# Patient Record
Sex: Male | Born: 1950 | Race: White | Hispanic: No | Marital: Single | Smoking: Never smoker
Health system: Southern US, Community
[De-identification: ages and names within clinical notes are randomized; demographics above are authoritative.]

## PROBLEM LIST (undated history)

## (undated) DIAGNOSIS — I5032 Chronic diastolic (congestive) heart failure: Secondary | ICD-10-CM

## (undated) DIAGNOSIS — I499 Cardiac arrhythmia, unspecified: Secondary | ICD-10-CM

## (undated) DIAGNOSIS — I4891 Unspecified atrial fibrillation: Secondary | ICD-10-CM

## (undated) DIAGNOSIS — I447 Left bundle-branch block, unspecified: Secondary | ICD-10-CM

## (undated) DIAGNOSIS — I6529 Occlusion and stenosis of unspecified carotid artery: Secondary | ICD-10-CM

## (undated) DIAGNOSIS — I2510 Atherosclerotic heart disease of native coronary artery without angina pectoris: Secondary | ICD-10-CM

## (undated) DIAGNOSIS — I10 Essential (primary) hypertension: Secondary | ICD-10-CM

## (undated) DIAGNOSIS — I509 Heart failure, unspecified: Secondary | ICD-10-CM

## (undated) DIAGNOSIS — E785 Hyperlipidemia, unspecified: Secondary | ICD-10-CM

## (undated) HISTORY — PX: NO PAST SURGERIES: SHX2092

## (undated) HISTORY — DX: Hyperlipidemia, unspecified: E78.5

## (undated) HISTORY — PX: CATARACT EXTRACTION, BILATERAL: SHX1313

## (undated) HISTORY — DX: Heart failure, unspecified: I50.9

## (undated) HISTORY — DX: Left bundle-branch block, unspecified: I44.7

## (undated) HISTORY — DX: Atherosclerotic heart disease of native coronary artery without angina pectoris: I25.10

## (undated) HISTORY — DX: Occlusion and stenosis of unspecified carotid artery: I65.29

---
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
## 2003-01-21 ENCOUNTER — Encounter: Admission: RE | Admit: 2003-01-21 | Discharge: 2003-01-21 | Payer: Self-pay | Admitting: Family Medicine

## 2003-01-21 ENCOUNTER — Encounter: Payer: Self-pay | Admitting: Family Medicine

## 2015-02-02 ENCOUNTER — Emergency Department (HOSPITAL_BASED_OUTPATIENT_CLINIC_OR_DEPARTMENT_OTHER): Payer: 59

## 2015-02-02 ENCOUNTER — Encounter (HOSPITAL_BASED_OUTPATIENT_CLINIC_OR_DEPARTMENT_OTHER): Payer: Self-pay | Admitting: *Deleted

## 2015-02-02 ENCOUNTER — Emergency Department (HOSPITAL_BASED_OUTPATIENT_CLINIC_OR_DEPARTMENT_OTHER)
Admission: EM | Admit: 2015-02-02 | Discharge: 2015-02-02 | Disposition: A | Discharge status: Home / Self Care | Payer: 59 | Attending: Emergency Medicine | Admitting: Emergency Medicine

## 2015-02-02 DIAGNOSIS — Y9301 Activity, walking, marching and hiking: Secondary | ICD-10-CM | POA: Diagnosis not present

## 2015-02-02 DIAGNOSIS — Y998 Other external cause status: Secondary | ICD-10-CM | POA: Insufficient documentation

## 2015-02-02 DIAGNOSIS — Y9289 Other specified places as the place of occurrence of the external cause: Secondary | ICD-10-CM | POA: Insufficient documentation

## 2015-02-02 DIAGNOSIS — S02412A LeFort II fracture, initial encounter for closed fracture: Secondary | ICD-10-CM | POA: Insufficient documentation

## 2015-02-02 DIAGNOSIS — S02411A LeFort I fracture, initial encounter for closed fracture: Secondary | ICD-10-CM

## 2015-02-02 DIAGNOSIS — S0031XA Abrasion of nose, initial encounter: Secondary | ICD-10-CM | POA: Diagnosis not present

## 2015-02-02 DIAGNOSIS — W010XXA Fall on same level from slipping, tripping and stumbling without subsequent striking against object, initial encounter: Secondary | ICD-10-CM | POA: Insufficient documentation

## 2015-02-02 DIAGNOSIS — Z23 Encounter for immunization: Secondary | ICD-10-CM | POA: Insufficient documentation

## 2015-02-02 DIAGNOSIS — S0992XA Unspecified injury of nose, initial encounter: Secondary | ICD-10-CM | POA: Diagnosis present

## 2015-02-02 MED ORDER — HYDROCODONE-ACETAMINOPHEN 5-325 MG PO TABS: ORAL_TABLET | ORAL | Status: DC

## 2015-02-02 MED ORDER — TETANUS-DIPHTH-ACELL PERTUSSIS 5-2.5-18.5 LF-MCG/0.5 IM SUSP
0.5000 mL | Freq: Once | INTRAMUSCULAR | Status: AC
  Administered 2015-02-02: 0.5 mL via INTRAMUSCULAR
  Filled 2015-02-02: qty 0.5

## 2015-02-02 MED ORDER — ACETAMINOPHEN 500 MG PO TABS
1000.0000 mg | ORAL_TABLET | Freq: Once | ORAL | Status: AC
  Administered 2015-02-02: 1000 mg via ORAL
  Filled 2015-02-02: qty 2

## 2015-02-02 NOTE — ED Notes (Signed)
Pt reports he tripped and fell on sidewalk while walking in [employer] caf[employer]ia- abrasions noted to face- denies LOC- ems eval on scene

## 2015-02-02 NOTE — Discharge Instructions (Signed)
Apply ice to the affected area to reduce swelling.  Eat only soft diet and make an appointment with the maxillofacial surgeon Dr. Marzetta Boardhimappa as soon as possible.  Do not hesitate to return to the emergency room for any new, worsening or concerning symptoms.  Please follow with your primary care doctor in the next 2 days for a check-up. They must obtain records for further management.   Do not hesitate to return to the Emergency Department for any new, worsening or concerning symptoms.

## 2015-02-02 NOTE — ED Provider Notes (Signed)
CSN: [id number]     Arrival date & time 02/02/15  1702 History   First MD Initiated Contact with Patient 02/02/15 1712     Chief Complaint  Patient presents with  . Fall     (Consider location/radiation/quality/duration/timing/severity/associated sxs/prior Treatment) HPI   [name] Rice is a 64 y.o. male complaining of nasal pain, swelling and nosebleed status post mechanical slip and fall. Patient was walking and fell forward onto the sidewalk, he does not think he put his hands out to brace him. There was no loss of consciousness, patient does not take any blood thinners. Denies cervicalgia, chest pain, headache, change in vision, dysarthria, ataxia, abdominal pain, nausea vomiting, difficulty moving major joints. States his pain is 6 out of 10, he does not remember when his last tetanus shot was given.  History reviewed. No pertinent past medical history. History reviewed. No pertinent past surgical history. No family history on file. History  Substance Use Topics  . Smoking status: Never Smoker   . Smokeless tobacco: Never Used  . Alcohol Use: 1.8 oz/week    3 Cans of beer per week    Review of Systems  10 systems reviewed and found to be negative, except as noted in the HPI.   Allergies  Review of patient's allergies indicates no known allergies.  Home Medications   Prior to Admission medications   Not on File   BP 175/98 mmHg  Pulse 91  Temp(Src) 97.8 F (36.6 C) (Oral)  Resp 18  Ht 6' (1.829 m)  Wt 290 lb (131.543 kg)  BMI 39.32 kg/m2  SpO2 100% Physical Exam  Constitutional: He is oriented to person, place, and time. He appears well-developed and well-nourished. No distress.  Patient is speaking with a very nasal voice, states he cannot breathe out of his nose.  HENT:  Head: Normocephalic.  Mouth/Throat: Oropharynx is clear and moist.  Significant swelling along the nasal bridge with associated scattered minor partial-thickness abrasions. Patient has no  tenderness palpation or crepitance along the orbital rim, extraocular movement is intact without pain or diplopia. No intraoral trauma.  Nasal septum appears to be been benign, no septal hematomas.  Eyes: Conjunctivae and EOM are normal. Pupils are equal, round, and reactive to light.  Neck: Normal range of motion. Neck supple.  No midline C-spine  tenderness to palpation or step-offs appreciated. Patient has full range of motion without pain.   Cardiovascular: Normal rate, regular rhythm and intact distal pulses.   Pulmonary/Chest: Effort normal. No stridor.  Musculoskeletal: Normal range of motion.  Neurological: He is alert and oriented to person, place, and time.  Follows commands, Clear, goal oriented speech, Strength is 5 out of 5x4 extremities, patient ambulates with a coordinated in nonantalgic gait. Sensation is grossly intact.   Psychiatric: He has a normal mood and affect.  Nursing note and vitals reviewed.   ED Course  Procedures (including critical care time) Labs Review Labs Reviewed - No data to display  Imaging Review No results found.   EKG Interpretation None      MDM   Final diagnoses:  None    Filed Vitals:   02/02/15 1711  BP: 175/98  Pulse: 91  Temp: 97.8 F (36.6 C)  TempSrc: Oral  Resp: 18  Height: 6' (1.829 m)  Weight: 290 lb (131.543 kg)  SpO2: 100%    Medications  acetaminophen (TYLENOL) tablet 1,000 mg (not administered)  Tdap (BOOSTRIX) injection 0.5 mL (not administered)    [name]  is a pleasant 64 y.o. male presenting with mechanical slip and fall forward with trauma to the nasal bridge and associated epistaxis. Patient was evaluated by EMS on scene but opted to drive himself to the ED. Neuro exam is nonfocal, patient is not taking any blood thinners. No signs of intraocular entrapment. Will CT maxillofacial and C-spine. Patient fails Nexus secondary to distracting injury.  CT shows an incomplete LeFort type I  fracture.  Case discussed with maxillofacial surgeon Dr. Marzetta Boardhimappa she recommends a soft diet and patient can follow-up in the office this week.  Evaluation does not show pathology that would require ongoing emergent intervention or inpatient treatment. Pt is hemodynamically stable and mentating appropriately. Discussed findings and plan with patient/guardian, who agrees with care plan. All questions answered. Return precautions discussed and outpatient follow up given.   Discharge Medication List as of 02/02/2015  7:41 PM    START taking these medications   Details  HYDROcodone-acetaminophen (NORCO/VICODIN) 5-325 MG per tablet Take 1-2 tablets by mouth every 6 hours as needed for pain and/or cough., Print             Wynetta Emeryicole Demarques Pilz, PA-C 02/02/15 2121  Purvis SheffieldForrest Harrison, MD 02/02/15 [phone]

## 2016-09-25 DIAGNOSIS — I4891 Unspecified atrial fibrillation: Secondary | ICD-10-CM | POA: Diagnosis present

## 2016-09-25 DIAGNOSIS — I482 Chronic atrial fibrillation, unspecified: Secondary | ICD-10-CM | POA: Diagnosis present

## 2016-09-25 NOTE — H&P (Signed)
OFFICE VISIT NOTES COPIED TO EPIC FOR DOCUMENTATION  . History of Present Illness Laverda Page MD; 09/02/2016 5:36 AM) Patient words: Last O/V 08/12/2016; F/U Echo & labs.  The patient is a 65 year old male who presents for a Follow-up for Atrial fibrillation. He recently presented to establish care with a new PCP as it had been many years since he had had a physical exam. During evaluation he was noted to be markedly hypertensive and irregular heartbeat was auscultated on exam, EKG revealing atrial fibrillation with controlled VR. He was referred to [place] on an urgent basis for accelerated hypertension.  Patient is losing weight by changing diet and has lost 23 pounds in weight. He continues to have marked fatigue and in fact was thinking of retirement due to fatigue. We discussed regarding atrial fibrillation and fatigue and also sleep apnea. he states that he does not have any symptoms of sleep apnea. He has put a hold on sleep study for now, his father will is with him as told him that he does not snore anymore since he lost weight and patient denies any daytime somnolence and he works for 10-12 hours every day.  He denied any chest pain, shortness of breath, PND, orthopnea, edema, dizziness, syncope, or symptoms suggestive of claudication or TIA. No headaches or visual disturbances.    Problem List/Past Medical (April Garrison; 09-10-2016 10:24 AM) New onset atrial fibrillation (I48.91)  Echocardiogram 08/25/2016: Left ventricle cavity is normal in size. Moderate concentric hypertrophy of the left ventricle. Normal global wall motion. Visual EF is 55-60%. Indeterminate diastolic filling pattern, indeterminate LAP due to A. Fib. Left atrial cavity is moderate to severely dilated at 4.9 cm. Right atrial cavity is moderate to severely dilated. Mild (Grade I) mitral regurgitation. Mild tricuspid regurgitation. No evidence of pulmonary hypertension. Hypertensive heart disease without heart  failure (I11.9)  Abnormal EKG (R94.31)  Accelerated hypertension (I10)   Allergies (April Garrison; 10-Sep-2016 10:24 AM) No Known Drug Allergies 08/05/2016  Family History (April Garrison; 09/10/2016 10:24 AM) Mother  Deceased. at age 81 from Breast Cancer; Had Stents placed, HTN Father  Living; Had several MI's starting in his 67's Brother 1  Younger; No known Heart conditions  Social History (April Garrison; 10-Sep-2016 10:24 AM) Current tobacco use  Never smoker. Alcohol Use  Occasional alcohol use. Marital status  Single. Number of Children  0. Living Situation  Lives with his Father  Past Surgical History (April Louretta Shorten; 09/10/16 10:24 AM) None 08/05/2016  Medication History (April Garrison; 2016/09/10 10:31 AM) Valsartan (320MG Tablet, 1 (one) Tablet Oral daily, Taken starting 08/12/2016) Active. Metoprolol Succinate ER (50MG Tablet ER 24HR, 1 (one) Tablet Tablet Oral daily, Taken starting 08/05/2016) Active. AmLODIPine Besylate (10MG Tablet, 1 (one) Tablet Tablet Oral daily, Taken starting 08/05/2016) Active. Xarelto (20MG Tablet, 1 (one) Tablet Tablet Oral every evening after dinner, Taken starting 08/05/2016) Active. Medications Reconciled (Verbally; No medications present)  Diagnostic Studies History (April Garrison; Sep 10, 2016 10:30 AM) Echocardiogram 08/25/2016 Left ventricle cavity is normal in size. Moderate concentric hypertrophy of the left ventricle. Normal global wall motion. Visual EF is 55-60%. Indeterminate diastolic filling pattern, indeterminate LAP due to A. Fib. Left atrial cavity is moderate to severely dilated at 4.9 cm. Right atrial cavity is moderate to severely dilated. Mild (Grade I) mitral regurgitation. Mild tricuspid regurgitation. No evidence of pulmonary hypertension. Labwork  08/25/2016: Glucose 102, creatinine 1.32, eGFR 57, potassium 4.5, CMP otherwise normal, CBC normal 08/05/2016: Glucose 111, creatinine 1.16, eGFR greater  than 60,  potassium 4.4, bilirubin 2.9, CMP otherwise normal, total cholesterol 205, triglycerides 77, HDL 59, direct LDL 136, non-HDL 146, Hb 18.0, HCT 53.6 with slightly macrocytic indices, CBC otherwise normal    Review of Systems Laverda Page MD; 09/01/2016 10:57 AM) General Present- Fatigue. Not Present- Anorexia and Fever. HEENT Not Present- Snoring. Respiratory Not Present- Cough, Decreased Exercise Tolerance, Difficulty Breathing on Exertion and Dyspnea. Cardiovascular Not Present- Chest Pain, Claudications, Edema, Orthopnea, Palpitations and Paroxysmal Nocturnal Dyspnea. Gastrointestinal Not Present- Black, Tarry Stool, Change in Bowel Habits and Nausea. Neurological Not Present- Focal Neurological Symptoms and Syncope. Endocrine Not Present- Cold Intolerance, Excessive Sweating, Heat Intolerance and Thyroid Problems. Hematology Not Present- Anemia, Easy Bruising, Petechiae and Prolonged Bleeding.  Vitals (April Garrison; 09/01/2016 10:33 AM) 09/01/2016 10:25 AM Weight: 289.38 lb Height: 73in Body Surface Area: 2.52 m Body Mass Index: 38.18 kg/m  Pulse: 76 (Regular)  P.OX: 99% (Room air) BP: 134/82 (Sitting, Left Arm, Standard)       Physical Exam Laverda Page MD; 09/01/2016 10:57 AM) General Mental Status-Alert. General Appearance-Cooperative, Appears stated age, Not in acute distress. Build & Nutrition-Well built and Moderately obese.  Head and Neck Neck -Note: Short neck and difficult to evaluate JVD.  Thyroid Gland Characteristics - no palpable nodules, no palpable enlargement.  Cardiovascular Cardiovascular examination reveals -normal heart sounds, regular rate and rhythm with no murmurs. Inspection Jugular vein - Right - No Distention.  Abdomen Inspection Contour - Obese and Pannus present. Palpation/Percussion Normal exam - Non Tender and No hepatosplenomegaly. Auscultation Normal exam - Bowel sounds  normal.  Peripheral Vascular Lower Extremity Inspection - Bilateral - Inspection Normal. Palpation - Edema - Bilateral - No edema. Femoral pulse - Bilateral - Feeble(Pulsus difficult to feel due to patient's bodily habitus.), No Bruits. Popliteal pulse - Bilateral - Feeble(Pulsus difficult to feel due to patient's bodily habitus.). Dorsalis pedis pulse - Bilateral - Normal. Posterior tibial pulse - Bilateral - Normal. Carotid arteries - Bilateral-No Carotid bruit.  Neurologic Neurologic evaluation reveals -alert and oriented x 3 with no impairment of recent or remote memory. Motor-Grossly intact without any focal deficits.  Musculoskeletal Global Assessment Left Lower Extremity - normal range of motion without pain. Right Lower Extremity - normal range of motion without pain.  Assessment & Plan Laverda Page MD; 09/02/2016 5:37 AM) New onset atrial fibrillation (I48.91) Story: CHA2DS2-VASc Score is 1 with yearly risk of stroke of 1.3%. (CHF; HTN; vasc disease DM, Male = 1; Age <65 =1; 65-74 = 2, >75 =3; stroke = 3).  Echocardiogram 08/25/2016: Left ventricle cavity is normal in size. Moderate concentric hypertrophy of the left ventricle. Normal global wall motion. Visual EF is 55-60%. Indeterminate diastolic filling pattern, indeterminate LAP due to A. Fib. Left atrial cavity is moderate to severely dilated at 4.9 cm. Right atrial cavity is moderate to severely dilated. Mild (Grade I) mitral regurgitation. Mild tricuspid regurgitation. No evidence of pulmonary hypertension. Impression: EKG 09/01/2016: Atrial flutter ablation controlled Secondary to 56 Bpm, LVH with Repolarization of Rheumatic, Cannot Exclude Septal and Lateral Ischemia. No significant change from EKG 07/28/2016. Current Plans Complete electrocardiogram (93000) Future Plans 98/33/8250: METABOLIC PANEL, BASIC (53976) - one time Hypertensive heart disease without heart failure (I11.9)  Labwork Story:  08/25/2016: Glucose 102, creatinine 1.32, eGFR 57, potassium 4.5, CMP otherwise normal, CBC normal  08/05/2016: Glucose 111, creatinine 1.16, eGFR greater than 60, potassium 4.4, bilirubin 2.9, CMP otherwise normal, total cholesterol 205, triglycerides 77, HDL 59, direct LDL 136, non-HDL 146, Hb 18.0,  HCT 53.6 with slightly macrocytic indices, CBC otherwise normal Hyperlipidemia, mild (E78.5) Current Plans Started Rosuvastatin Calcium 10MG, 1 (one) Tablet daily, #30, 09/01/2016, Ref. x2. Abnormal EKG (R94.31) Current Plans Mechanism of underlying disease process and action of medications discussed with the patient. I discussed primary/secondary prevention and also dietary counseling was done. Patient is currently tolerating all his medications well. I started him on Crestor 10 mg by mouth daily for hyperlipidemia. Due to his cardiac vascular risk factors, I recommended a exercise nuclear stress test in view of baseline abnormal EKG to exclude coronary artery disease as an etiology for atrial fibrillation and fatigue. I'll set him up for cardioversion if stress test is normal. Otherwise no changes in the medications were done today. We discussed making and continued lifestyle changes. He does not want to sleep study at this point, states that he does not have symptoms of sleep apnea works 12 hours a day without any feeling of daytime somnolence. Schedule for Direct current cardioversion. I have discussed regarding risks benefits rate control vs rhythm control with the patient. Patient understands cardiac arrest and need for CPR, aspiration pneumonia, but not limited to these. Patient is willing. It will be scheduled for in the next few weeks.  CC Bing Matter, PA-C  Future Plans 09/13/2016: Myocardial perfusion imaging, tomographic (SPECT) (including attenuation correction, qualitative or quantitative wall motion, ejection fraction by first pass or gated technique, additional quantification, when  performed) - one time   Signed by Laverda Page, MD (09/02/2016 5:37 AM)

## 2016-09-28 ENCOUNTER — Ambulatory Visit (HOSPITAL_COMMUNITY): Payer: Commercial Managed Care - HMO | Admitting: Anesthesiology

## 2016-09-28 ENCOUNTER — Encounter (HOSPITAL_COMMUNITY)
Admission: RE | Disposition: A | Discharge status: Home / Self Care | Payer: Self-pay | Source: Ambulatory Visit | Attending: Cardiology

## 2016-09-28 ENCOUNTER — Encounter (HOSPITAL_COMMUNITY): Payer: Self-pay | Admitting: *Deleted

## 2016-09-28 ENCOUNTER — Ambulatory Visit (HOSPITAL_COMMUNITY)
Admission: RE | Admit: 2016-09-28 | Discharge: 2016-09-28 | Disposition: A | Discharge status: Home / Self Care | Payer: Commercial Managed Care - HMO | Source: Ambulatory Visit | Attending: Cardiology | Admitting: Cardiology

## 2016-09-28 DIAGNOSIS — Z8249 Family history of ischemic heart disease and other diseases of the circulatory system: Secondary | ICD-10-CM | POA: Insufficient documentation

## 2016-09-28 DIAGNOSIS — I4892 Unspecified atrial flutter: Secondary | ICD-10-CM | POA: Diagnosis not present

## 2016-09-28 DIAGNOSIS — I4891 Unspecified atrial fibrillation: Secondary | ICD-10-CM | POA: Insufficient documentation

## 2016-09-28 DIAGNOSIS — Z7901 Long term (current) use of anticoagulants: Secondary | ICD-10-CM | POA: Diagnosis not present

## 2016-09-28 DIAGNOSIS — Z803 Family history of malignant neoplasm of breast: Secondary | ICD-10-CM | POA: Diagnosis not present

## 2016-09-28 DIAGNOSIS — I119 Hypertensive heart disease without heart failure: Secondary | ICD-10-CM | POA: Insufficient documentation

## 2016-09-28 DIAGNOSIS — Z79899 Other long term (current) drug therapy: Secondary | ICD-10-CM | POA: Insufficient documentation

## 2016-09-28 DIAGNOSIS — E785 Hyperlipidemia, unspecified: Secondary | ICD-10-CM | POA: Insufficient documentation

## 2016-09-28 DIAGNOSIS — Z6836 Body mass index (BMI) 36.0-36.9, adult: Secondary | ICD-10-CM | POA: Insufficient documentation

## 2016-09-28 DIAGNOSIS — I482 Chronic atrial fibrillation, unspecified: Secondary | ICD-10-CM | POA: Diagnosis present

## 2016-09-28 HISTORY — DX: Cardiac arrhythmia, unspecified: I49.9

## 2016-09-28 HISTORY — PX: CARDIOVERSION: SHX1299

## 2016-09-28 HISTORY — DX: Essential (primary) hypertension: I10

## 2016-09-28 SURGERY — CARDIOVERSION
Anesthesia: General

## 2016-09-28 MED ORDER — SODIUM CHLORIDE 0.9 % IV SOLN
INTRAVENOUS | Status: DC
  Administered 2016-09-28: 12:00:00 via INTRAVENOUS

## 2016-09-28 MED ORDER — LIDOCAINE HCL (CARDIAC) 20 MG/ML IV SOLN
INTRAVENOUS | Status: DC | PRN
  Administered 2016-09-28: 100 mg via INTRATRACHEAL

## 2016-09-28 MED ORDER — PROPOFOL 10 MG/ML IV BOLUS
INTRAVENOUS | Status: DC | PRN
  Administered 2016-09-28: 30 mg via INTRAVENOUS
  Administered 2016-09-28: 100 mg via INTRAVENOUS
  Administered 2016-09-28: 20 mg via INTRAVENOUS

## 2016-09-28 NOTE — Anesthesia Postprocedure Evaluation (Signed)
Anesthesia Post Note  Patient: [name] Rice  Procedure(s) Performed: Procedure(s) (LRB): CARDIOVERSION (N/A)  Patient location during evaluation: Endoscopy Anesthesia Type: General Level of consciousness: awake Pain management: pain level controlled Vital Signs Assessment: post-procedure vital signs reviewed and stable Respiratory status: spontaneous breathing Cardiovascular status: stable Postop Assessment: no signs of nausea or vomiting Anesthetic complications: no    Last Vitals:  Vitals:   09/28/16 1240 09/28/16 1250  BP: (!) 99/51 120/70  Pulse: (!) 46 (!) 42  Resp: 14 18    Last Pain:  Vitals:   09/28/16 1024  TempSrc: Oral                 Akeira Lahm

## 2016-09-28 NOTE — Interval H&P Note (Signed)
History and Physical Interval Note:  09/28/2016 11:49 AM  [name] Rice  has presented today for surgery, with the diagnosis of AFIB  The various methods of treatment have been discussed with the patient and family. After consideration of risks, benefits and other options for treatment, the patient has consented to  Procedure(s): CARDIOVERSION (N/A) as a surgical intervention .  The patient's history has been reviewed, patient examined, no change in status, stable for surgery.  I have reviewed the patient's chart and labs.  Questions were answered to the patient's satisfaction.     Yates DecampGANJI, Jerra Huckeby

## 2016-09-28 NOTE — CV Procedure (Signed)
Direct current cardioversion:  Indication symptomatic A. Fibrillation.  Procedure: Using 150 mg of IV Propofol and 100 IV Lidocaine (for reducing venous pain) for achieving deep sedation, synchronized direct current cardioversion performed. Patient was delivered with 150x1, 200 Joules of electricity X 2. Unsuccessful to convert to SR. Patient tolerated the procedure well. No immediate complication noted.   FAILED CARDIOVERSION ATTEMPT. Continue rate control strategy only.

## 2016-09-28 NOTE — Anesthesia Preprocedure Evaluation (Signed)
Anesthesia Evaluation  Patient identified by MRN, date of birth, ID band Patient awake    Reviewed: Allergy & Precautions, NPO status , Patient's Chart, lab work & pertinent test results, reviewed documented beta blocker date and time   History of Anesthesia Complications Negative for: history of anesthetic complications  Airway Mallampati: II  TM Distance: >3 FB Neck ROM: Full    Dental  (+) Poor Dentition   Pulmonary neg pulmonary ROS,    breath sounds clear to auscultation       Cardiovascular hypertension, Pt. on home beta blockers + dysrhythmias Atrial Fibrillation  Rhythm:Irregular     Neuro/Psych negative neurological ROS  negative psych ROS   GI/Hepatic negative GI ROS, Neg liver ROS,   Endo/Other  Morbid obesity  Renal/GU negative Renal ROS     Musculoskeletal   Abdominal   Peds  Hematology negative hematology ROS (+)   Anesthesia Other Findings   Reproductive/Obstetrics                             Anesthesia Physical Anesthesia Plan  ASA: III  Anesthesia Plan: General   Post-op Pain Management:    Induction: Intravenous  Airway Management Planned: Mask  Additional Equipment: None  Intra-op Plan:   Post-operative Plan:   Informed Consent: I have reviewed the patients History and Physical, chart, labs and discussed the procedure including the risks, benefits and alternatives for the proposed anesthesia with the patient or authorized representative who has indicated his/her understanding and acceptance.   Dental advisory given  Plan Discussed with: CRNA and Surgeon  Anesthesia Plan Comments:         Anesthesia Quick Evaluation

## 2016-09-28 NOTE — Discharge Instructions (Signed)
Electrical Cardioversion, Care After °This sheet gives you information about how to care for yourself after your procedure. Your health care provider may also give you more specific instructions. If you have problems or questions, contact your health care provider. °What can I expect after the procedure? °After the procedure, it is common to have: °· Some redness on the skin where the shocks were given. °Follow these instructions at home: °· Do not drive for 24 hours if you were given a medicine to help you relax (sedative). °· Take over-the-counter and prescription medicines only as told by your health care provider. °· Ask your health care provider how to check your pulse. Check it often. °· Rest for 48 hours after the procedure or as told by your health care provider. °· Avoid or limit your caffeine use as told by your health care provider. °Contact a health care provider if: °· You feel like your heart is beating too quickly or your pulse is not regular. °· You have a serious muscle cramp that does not go away. °Get help right away if: °· You have discomfort in your chest. °· You are dizzy or you feel faint. °· You have trouble breathing or you are short of breath. °· Your speech is slurred. °· You have trouble moving an arm or leg on one side of your body. °· Your fingers or toes turn cold or blue. °This information is not intended to replace advice given to you by your health care provider. Make sure you discuss any questions you have with your health care provider. °Document Released: 08/15/2013 Document Revised: 05/28/2016 Document Reviewed: 04/30/2016 °Elsevier Interactive Patient Education © 2017 [employer] ° °

## 2016-09-28 NOTE — Transfer of Care (Signed)
Immediate Anesthesia Transfer of Care Note  Patient: [name] BankerJames R Wynter  Procedure(s) Performed: Procedure(s): CARDIOVERSION (N/A)  Patient Location: Endoscopy Unit  Anesthesia Type:General  Level of Consciousness: awake, alert  and oriented  Airway & Oxygen Therapy: Patient Spontanous Breathing and Patient connected to nasal cannula oxygen  Post-op Assessment: Report given to RN, Post -op Vital signs reviewed and stable and Patient moving all extremities X 4  Post vital signs: Reviewed and stable  Last Vitals:  Vitals:   09/28/16 1024  BP: (!) 142/83  Pulse: 68  Resp: 19    Last Pain:  Vitals:   09/28/16 1024  TempSrc: Oral         Complications: No apparent anesthesia complications

## 2017-07-16 ENCOUNTER — Emergency Department (HOSPITAL_BASED_OUTPATIENT_CLINIC_OR_DEPARTMENT_OTHER)
Admission: EM | Admit: 2017-07-16 | Discharge: 2017-07-16 | Disposition: A | Discharge status: Home / Self Care | Payer: 59 | Attending: Physician Assistant | Admitting: Physician Assistant

## 2017-07-16 ENCOUNTER — Encounter (HOSPITAL_BASED_OUTPATIENT_CLINIC_OR_DEPARTMENT_OTHER): Payer: Self-pay | Admitting: Emergency Medicine

## 2017-07-16 ENCOUNTER — Emergency Department (HOSPITAL_BASED_OUTPATIENT_CLINIC_OR_DEPARTMENT_OTHER): Payer: 59

## 2017-07-16 DIAGNOSIS — R1011 Right upper quadrant pain: Secondary | ICD-10-CM | POA: Insufficient documentation

## 2017-07-16 DIAGNOSIS — Z7901 Long term (current) use of anticoagulants: Secondary | ICD-10-CM | POA: Diagnosis not present

## 2017-07-16 DIAGNOSIS — R1031 Right lower quadrant pain: Secondary | ICD-10-CM

## 2017-07-16 DIAGNOSIS — I10 Essential (primary) hypertension: Secondary | ICD-10-CM | POA: Diagnosis not present

## 2017-07-16 DIAGNOSIS — R109 Unspecified abdominal pain: Secondary | ICD-10-CM | POA: Diagnosis present

## 2017-07-16 HISTORY — DX: Unspecified atrial fibrillation: I48.91

## 2017-07-16 LAB — URINALYSIS, ROUTINE W REFLEX MICROSCOPIC
BILIRUBIN URINE: NEGATIVE
GLUCOSE, UA: NEGATIVE mg/dL
HGB URINE DIPSTICK: NEGATIVE
KETONES UR: NEGATIVE mg/dL
Leukocytes, UA: NEGATIVE
Nitrite: NEGATIVE
PH: 6 (ref 5.0–8.0)
PROTEIN: NEGATIVE mg/dL
Specific Gravity, Urine: 1.015 (ref 1.005–1.030)

## 2017-07-16 LAB — COMPREHENSIVE METABOLIC PANEL
ALBUMIN: 3.8 g/dL (ref 3.5–5.0)
ALT: 16 U/L — ABNORMAL LOW (ref 17–63)
ANION GAP: 7 (ref 5–15)
AST: 21 U/L (ref 15–41)
Alkaline Phosphatase: 64 U/L (ref 38–126)
BILIRUBIN TOTAL: 1.6 mg/dL — AB (ref 0.3–1.2)
BUN: 16 mg/dL (ref 6–20)
CALCIUM: 9 mg/dL (ref 8.9–10.3)
CO2: 26 mmol/L (ref 22–32)
Chloride: 105 mmol/L (ref 101–111)
Creatinine, Ser: 1.17 mg/dL (ref 0.61–1.24)
GFR calc non Af Amer: >60 mL/min (ref >60)
GLUCOSE: 99 mg/dL (ref 65–99)
POTASSIUM: 4.5 mmol/L (ref 3.5–5.1)
SODIUM: 138 mmol/L (ref 135–145)
TOTAL PROTEIN: 7.1 g/dL (ref 6.5–8.1)

## 2017-07-16 LAB — CBC WITH DIFFERENTIAL/PLATELET
BASOS ABS: 0 10*3/uL (ref 0.0–0.1)
BASOS PCT: 0 %
Eosinophils Absolute: 0.3 10*3/uL (ref 0.0–0.7)
Eosinophils Relative: 3 %
HCT: 42.7 % (ref 39.0–52.0)
Hemoglobin: 14.2 g/dL (ref 13.0–17.0)
LYMPHS PCT: 12 %
Lymphs Abs: 1.4 10*3/uL (ref 0.7–4.0)
MCH: 29.6 pg (ref 26.0–34.0)
MCHC: 33.3 g/dL (ref 30.0–36.0)
MCV: 89.1 fL (ref 78.0–100.0)
MONO ABS: 1 10*3/uL (ref 0.1–1.0)
Monocytes Relative: 8 %
NEUTROS ABS: 9.5 10*3/uL — AB (ref 1.7–7.7)
NEUTROS PCT: 77 %
PLATELETS: 253 10*3/uL (ref 150–400)
RBC: 4.79 MIL/uL (ref 4.22–5.81)
RDW: 14.2 % (ref 11.5–15.5)
WBC: 12.3 10*3/uL — ABNORMAL HIGH (ref 4.0–10.5)

## 2017-07-16 LAB — LIPASE, BLOOD: LIPASE: 28 U/L (ref 11–51)

## 2017-07-16 MED ORDER — KETOROLAC TROMETHAMINE 30 MG/ML IJ SOLN
30.0000 mg | Freq: Once | INTRAMUSCULAR | Status: AC
  Administered 2017-07-16: 30 mg via INTRAVENOUS
  Filled 2017-07-16: qty 1

## 2017-07-16 MED ORDER — IOPAMIDOL (ISOVUE-300) INJECTION 61%
100.0000 mL | Freq: Once | INTRAVENOUS | Status: AC | PRN
  Administered 2017-07-16: 100 mL via INTRAVENOUS

## 2017-07-16 NOTE — ED Notes (Signed)
EDP at bedside  

## 2017-07-16 NOTE — Discharge Instructions (Signed)
Please follow up with your primary care and take ibuprofen (400) + tyelenol 500 every 6 hours to help with pain you may gave.   Return with any fever, vomiting or other concerns.

## 2017-07-16 NOTE — ED Provider Notes (Signed)
[facility] Provider Note   CSN: [id number] Arrival date & time: 07/16/17  1223     History   Chief Complaint Chief Complaint  Patient presents with  . Flank Pain    rib pain/flank pain    HPI [name] Rice is a 66 y.o. male.  HPI   Patient is a 66 year old male presenting with sharp pain to the right mid quadrant of his abdomen.Marland Kitchen He reports started at 8 AM this morning. He reports he had a couple twinges yesterday but all of a sudden today, worse. He reports the pain is very bad when driving the car here every bump long way hurt it. He also hurts when he moves.  No nausea no vomiting no diarrhea or constipation.   Past Medical History:  Diagnosis Date  . Atrial fibrillation (HCC)   . Dysrhythmia   . Hypertension     Patient Active Problem List   Diagnosis Date Noted  . Atrial fibrillation (HCC) 09/25/2016    Past Surgical History:  Procedure Laterality Date  . CARDIOVERSION N/A 09/28/2016   Procedure: CARDIOVERSION;  Surgeon: Yates Decamp, MD;  Location: [facility] ENDOSCOPY;  Service: Cardiovascular;  Laterality: N/A;  . NO PAST SURGERIES         Home Medications    Prior to Admission medications   Medication Sig Start Date End Date Taking? Authorizing Provider  amLODipine (NORVASC) 10 MG tablet Take 10 mg by mouth daily.    [provider]  metoprolol succinate (TOPROL-XL) 50 MG 24 hr tablet Take 50 mg by mouth daily. Take with or immediately following a meal.    [provider]  rivaroxaban (XARELTO) 20 MG TABS tablet Take 20 mg by mouth daily with supper.    [provider]  rosuvastatin (CRESTOR) 10 MG tablet Take 10 mg by mouth daily.    [provider]  valsartan (DIOVAN) 320 MG tablet Take 320 mg by mouth daily.    [provider]    Family History No family history on file.  Social History Social History  Substance Use Topics  . Smoking status: Never Smoker  . Smokeless tobacco: Never Used  .  Alcohol use No     Allergies   Patient has no known allergies.   Review of Systems Review of Systems  Constitutional: Negative for activity change, fatigue and fever.  Respiratory: Negative for shortness of breath.   Cardiovascular: Negative for chest pain.  Gastrointestinal: Positive for abdominal pain.  All other systems reviewed and are negative.    Physical Exam Updated Vital Signs BP (!) 139/98 (BP Location: Left Arm)   Pulse 72   Temp 97.8 F (36.6 C) (Oral)   Resp 20   Ht  (1.854 m)   Wt 129.7 kg (286 lb)   SpO2 98%   BMI 37.73 kg/m   Physical Exam  Constitutional: He is oriented to person, place, and time. He appears well-nourished.  HENT:  Head: Normocephalic.  Poor dentition  Eyes: Conjunctivae are normal.  Cardiovascular: Normal rate.   Pulmonary/Chest: Effort normal and breath sounds normal.  Abdominal: Soft. He exhibits distension. There is tenderness.  Difficult exam due to obesity. Pain in the right upper to mid quadrant.  Neurological: He is oriented to person, place, and time. No cranial nerve deficit.  Skin: Skin is warm and dry. He is not diaphoretic.  Psychiatric: He has a normal mood and affect. His behavior is normal.     ED Treatments / Results  Labs (all labs ordered are listed, but only abnormal results are displayed) Labs Reviewed  COMPREHENSIVE METABOLIC PANEL  CBC WITH DIFFERENTIAL/PLATELET  LIPASE, BLOOD  URINALYSIS, ROUTINE W REFLEX MICROSCOPIC    EKG  EKG Interpretation None       Radiology No results found.  Procedures Procedures (including critical care time)  Medications Ordered in ED Medications - No data to display   Initial Impression / Assessment and Plan / ED Course  I have reviewed the triage vital signs and the nursing notes.  Pertinent labs & imaging results that were available during my care of the patient were reviewed by me and considered in my medical decision making (see chart for  details).     Patient is a 66 year old male presenting with sharp pain to the right mid quadrant of his abdomen.Marland Kitchen. He reports started at 8 AM this morning. He reports he had a couple twinges yesterday but all of a sudden today, worse. He reports the pain is very bad when driving the car here every bump long way hurt it. He also hurts when he moves.  No nausea no vomiting no diarrhea or constipation.   2:15 PM We'll get labs, we'll get an US of right upper quadrant given the location of pain.  6:53 AM US negative, got CT. CT negative for new pathology. Patient feels completely improved. Likely MSK vs gas distenion pains. Taking PO, will discharge. Pt and father looking forward to going to play slots.   Final Clinical Impressions(s) / ED Diagnoses   Final diagnoses:  RUQ pain    New Prescriptions New Prescriptions   No medications on file     Abelino DerrickMackuen, Brittin Janik Lyn, MD 07/17/17 [phone]

## 2017-07-16 NOTE — ED Notes (Signed)
Patient transported to Ultrasound 

## 2017-07-16 NOTE — ED Triage Notes (Addendum)
Last night had a episode of pain  to right side/rib area that resolved and again this am at 0800, worse with movement, denies recent injury or urinary s/s

## 2017-10-24 ENCOUNTER — Ambulatory Visit: Payer: Self-pay | Admitting: *Deleted

## 2017-10-24 NOTE — Telephone Encounter (Signed)
  Reason for Disposition . [1] MILD dizziness (e.g., walking normally) AND [2] has NOT been evaluated by physician for this  (Exception: dizziness caused by heat exposure, sudden standing, or poor fluid intake)  Answer Assessment - Initial Assessment Questions 1. DESCRIPTION: "Describe your dizziness."     Feels lightheaded, feels like queasy 2. LIGHTHEADED: "Do you feel lightheaded?" (e.g., somewhat faint, woozy, weak upon standing)     Feels with lying down and turning head 3. VERTIGO: "Do you feel like either you or the room is spinning or tilting?" (i.e. vertigo)     spinning 4. SEVERITY: "How bad is it?"  "Do you feel like you are going to faint?" "Can you stand and walk?"   - MILD - walking normally   - MODERATE - interferes with normal activities (e.g., work, school)    - SEVERE - unable to stand, requires support to walk, feels like passing out now.      Mild, pt is able to stand up and walk 5. ONSET:  "When did the dizziness begin?"     Friday,comes and goes then again on today 6. AGGRAVATING FACTORS: "Does anything make it worse?" (e.g., standing, change in head position)     Changing head position 7. HEART RATE: "Can you tell me your heart rate?" "How many beats in 15 seconds?"  (Note: not all patients can do this)       Pt is unable to do 8. CAUSE: "What do you think is causing the dizziness?"     Vertigo 9. RECURRENT SYMPTOM: "Have you had dizziness before?" If so, ask: "When was the last time?" "What happened that time?"     Yes has happened on Friday 10. OTHER SYMPTOMS: "Do you have any other symptoms?" (e.g., fever, chest pain, vomiting, diarrhea, bleeding)      Cold like symptoms, chest cold, runny nose and congestion  Protocols used: DIZZINESS - LIGHTHEADEDNESS-A-AH  Pt having complaints of intermittent dizziness since Friday and states today it came again. Pt states it worsens with turning his head. Pt asking for appt today but no appt available and pt offered to make  appt for tomorrow but pt refused. Advised pt that he could go to Urgent Care to have evaluation since he did not want to make appt. Pt verbalized understanding. Advised pt to call back if he wanted to make appt.

## 2019-01-25 ENCOUNTER — Other Ambulatory Visit: Payer: Self-pay | Admitting: Cardiology

## 2019-02-02 ENCOUNTER — Other Ambulatory Visit: Payer: Self-pay | Admitting: Cardiology

## 2019-02-05 ENCOUNTER — Other Ambulatory Visit: Payer: Self-pay | Admitting: Cardiology

## 2019-04-27 ENCOUNTER — Other Ambulatory Visit: Payer: Self-pay | Admitting: Cardiology

## 2019-04-27 DIAGNOSIS — I119 Hypertensive heart disease without heart failure: Secondary | ICD-10-CM

## 2019-06-07 ENCOUNTER — Encounter: Payer: Self-pay | Admitting: Cardiology

## 2019-06-11 ENCOUNTER — Other Ambulatory Visit: Payer: Self-pay

## 2019-06-11 ENCOUNTER — Telehealth (INDEPENDENT_AMBULATORY_CARE_PROVIDER_SITE_OTHER): Payer: Medicare Other | Admitting: Cardiology

## 2019-06-11 VITALS — BP 127/66 | Ht 74.0 in | Wt 275.0 lb

## 2019-06-11 DIAGNOSIS — I482 Chronic atrial fibrillation, unspecified: Secondary | ICD-10-CM | POA: Diagnosis not present

## 2019-06-11 DIAGNOSIS — E7800 Pure hypercholesterolemia, unspecified: Secondary | ICD-10-CM | POA: Diagnosis not present

## 2019-06-11 DIAGNOSIS — I119 Hypertensive heart disease without heart failure: Secondary | ICD-10-CM | POA: Diagnosis not present

## 2019-06-11 NOTE — Progress Notes (Signed)
Primary Physician:  Aletha Halim., PA-C   Patient ID: [name] Rice, male    DOB: 05/22/1951, 68 y.o.   MRN: [id number]  Subjective:    CC: follow up A fib, HTN  This visit type was conducted due to national recommendations for restrictions regarding the COVID-19 Pandemic (e.g. social distancing).  This format is felt to be most appropriate for this patient at this time.  All issues noted in this document were discussed and addressed.  No physical exam was performed (except for noted visual exam findings with Telehealth visits).  The patient has consented to conduct a Telehealth visit and understands insurance will be billed.   I discussed the limitations of evaluation and management by telemedicine and the availability of in person appointments. The patient expressed understanding and agreed to proceed.  Virtual Visit via Video Note is as below  I connected with [name] Rice, on 06/11/19 at 1050 by telephone and verified that I am speaking with the correct person using two identifiers. Unable to perform video visit as patient did not have equipment.    I have discussed with the patient regarding the safety during COVID Pandemic and steps and precautions including social distancing with the patient.    HPI: [name] Rice  is a 68 y.o. male  with hypertension, hyperlipidemia, obesity, and atrial fibrillation.  Underwent unsuccessful attempt at cardioversion on 09/28/2016. He is asymptomatic in regards to A fib and is on rate control therapy. Has history of hypertension and hypertensive heart disease with abnormal EKG and moderate LVH by echocardiogram in 2017.   Patient presents for 1 year follow-up for chronic atrial fibrillation. Remains asymptomatic and without complaints today. He has been making diet changes and has lost 20-25 lbs since last seen by me.He denies any snoring or increased fatigue. He is on Xarelto for anticoagulation, no bleeding diathesis reported.   He  denies any chest pain, shortness of breath, PND, orthopnea, edema, dizziness, syncope, or symptoms suggestive of claudication or TIA. No headaches or visual disturbances.   Past Medical History:  Diagnosis Date  . Atrial fibrillation ([place])   . Dysrhythmia   . Hypertension     Past Surgical History:  Procedure Laterality Date  . CARDIOVERSION N/A 09/28/2016   Procedure: CARDIOVERSION;  Surgeon: Adrian Prows, MD;  Location: [facility];  Service: Cardiovascular;  Laterality: N/A;  . NO PAST SURGERIES      Social History   Socioeconomic History  . Marital status: Single    Spouse name: Not on file  . Number of children: 0  . Years of education: Not on file  . Highest education level: Not on file  Occupational History  . Not on file  Social Needs  . Financial resource strain: Not on file  . Food insecurity    Worry: Not on file    Inability: Not on file  . Transportation needs    Medical: Not on file    Non-medical: Not on file  Tobacco Use  . Smoking status: Never Smoker  . Smokeless tobacco: Never Used  Substance and Sexual Activity  . Alcohol use: No  . Drug use: No  . Sexual activity: Not on file  Lifestyle  . Physical activity    Days per week: Not on file    Minutes per session: Not on file  . Stress: Not on file  Relationships  . Social Herbalist on phone: Not on file    Gets together: Not  on file    Attends religious service: Not on file    Active member of club or organization: Not on file    Attends meetings of clubs or organizations: Not on file    Relationship status: Not on file  . Intimate partner violence    Fear of current or ex partner: Not on file    Emotionally abused: Not on file    Physically abused: Not on file    Forced sexual activity: Not on file  Other Topics Concern  . Not on file  Social History Narrative  . Not on file    Review of Systems  Constitution: Positive for weight loss (intentional). Negative for decreased  appetite, malaise/fatigue and weight gain.  Eyes: Negative for visual disturbance.  Cardiovascular: Negative for chest pain, claudication, dyspnea on exertion, leg swelling, orthopnea, palpitations and syncope.  Respiratory: Negative for hemoptysis and wheezing.   Endocrine: Negative for cold intolerance and heat intolerance.  Hematologic/Lymphatic: Does not bruise/bleed easily.  Skin: Negative for nail changes.  Musculoskeletal: Negative for muscle weakness and myalgias.  Gastrointestinal: Negative for abdominal pain, change in bowel habit, nausea and vomiting.  Neurological: Negative for difficulty with concentration, dizziness, focal weakness and headaches.  Psychiatric/Behavioral: Negative for altered mental status and suicidal ideas.  All other systems reviewed and are negative.     Objective:  Blood pressure 127/66, height _0  (1.88 m), weight 275 lb (124.7 kg). Body mass index is 35.31 kg/m.    Physical exam not performed or limited due to virtual visit.   Please see exam details from prior visit is as below.   Physical Exam  Constitutional: He is oriented to person, place, and time. Vital signs are normal. He appears well-developed and well-nourished.  HENT:  Head: Normocephalic and atraumatic.  Neck: Normal range of motion.  Cardiovascular: Normal rate, regular rhythm, normal heart sounds and intact distal pulses.  Pulses:      Femoral pulses are 1+ on the right side and 1+ on the left side.      Popliteal pulses are 1+ on the right side and 1+ on the left side.       Dorsalis pedis pulses are 1+ on the right side and 1+ on the left side.       Posterior tibial pulses are 1+ on the right side and 1+ on the left side.  Pulmonary/Chest: Effort normal and breath sounds normal. No accessory muscle usage. No respiratory distress.  Abdominal: Soft. Bowel sounds are normal.  Musculoskeletal: Normal range of motion.  Neurological: He is alert and oriented to person, place, and  time.  Skin: Skin is warm and dry.  Vitals reviewed.  Radiology: No results found.  Laboratory examination:   04/14/2018: Creatinine 1.2, EGFR 62/71, potassium 4.7, CMP normal.  CBC normal.  TSH 1.34.  Cholesterol 118, triglycerides 88, HDL 37, LDL 63.  CMP Latest Ref Rng & Units 07/16/2017  Glucose 65 - 99 mg/dL 99  BUN 6 - 20 mg/dL 16  Creatinine 0.61 - 1.24 mg/dL 1.17  Sodium 135 - 145 mmol/L 138  Potassium 3.5 - 5.1 mmol/L 4.5  Chloride 101 - 111 mmol/L 105  CO2 22 - 32 mmol/L 26  Calcium 8.9 - 10.3 mg/dL 9.0  Total Protein 6.5 - 8.1 g/dL 7.1  Total Bilirubin 0.3 - 1.2 mg/dL 1.6(H)  Alkaline Phos 38 - 126 U/L 64  AST 15 - 41 U/L 21  ALT 17 - 63 U/L 16(L)   CBC Latest Ref  Rng & Units 07/16/2017  WBC 4.0 - 10.5 K/uL 12.3(H)  Hemoglobin 13.0 - 17.0 g/dL 14.2  Hematocrit 39.0 - 52.0 % 42.7  Platelets 150 - 400 K/uL 253   Lipid Panel  No results found for: CHOL, TRIG, HDL, CHOLHDL, VLDL, LDLCALC, LDLDIRECT HEMOGLOBIN A1C No results found for: HGBA1C, MPG TSH No results for input(s): TSH in the last 8760 hours.  PRN Meds:. Medications Discontinued During This Encounter  Medication Reason  . metoprolol succinate (TOPROL-XL) 50 MG 24 hr tablet   . valsartan (DIOVAN) 320 MG tablet    Current Meds  Medication Sig  . amLODipine (NORVASC) 10 MG tablet TAKE 1 TABLET BY MOUTH EVERY DAY  . metoprolol tartrate (LOPRESSOR) 25 MG tablet TAKE 1 TABLET BY MOUTH TWICE A DAY  . olmesartan (BENICAR) 40 MG tablet TAKE 1 TABLET BY MOUTH EVERY DAY  . rosuvastatin (CRESTOR) 10 MG tablet Take 10 mg by mouth daily.  Alveda Reasons 20 MG TABS tablet 1 (ONE) TABLET EVERY EVENING AFTER DINNER    Cardiac Studies:   Echocardiogram [08/25/2016]: Left ventricle cavity is normal in size. Moderate concentric hypertrophy of the left ventricle. Normal global wall motion. Visual EF is 55-60%. Indeterminate diastolic filling pattern, indeterminate LAP due to A. Fib. Left atrial cavity is moderate to  severely dilated at 4.9 cm. Right atrial cavity is moderate to severely dilated. Mild (Grade I) mitral regurgitation. Mild tricuspid regurgitation. No evidence of pulmonary hypertension.  Nuclear stress test [09/13/2016]: 1. Resting EKG demonstrates atrial fibrillation with controlled ventricular response, incomplete right bundle branch block, diffuse nonspecific ST-T wave abnormality, cannot exclude inferior and lateral ischemia. Stress EKG was negative for myocardial ischemia. Occasional PVCs were noted. Brief episode of wide-complex tachycardia is most likely an artifact. Patient exercised on Bruce protocol for 5:51mnutes and achieved 4.64 METS. Stress test terminated due to dyspnea and 118% MPHR achieved (Target HR >85%). 2. Nuclear perfusion imaging studies demonstrate correct uptake artifact in the inferior wall without demonstrable ischemia or scar. LVEF was calculated at 47%, visually appears normal. This represents a low risk study.  Cardioversion [09/28/2016]: Unsuccessful with 150x1 & 200J x 2.   Assessment:     ICD-10-CM   1. Chronic atrial fibrillation  I48.20 CBC    Comprehensive Metabolic Panel (CMET)  2. Hypertensive heart disease without heart failure  I11.9   3. Pure hypercholesterolemia  E78.00 Lipid Profile    EKG 04/10/2018: Atrial fibrillation at 63 bpm, normal axis, incomplete RBBB, LVH with repolarization abnormality, cannot exclude ischemia. No changes compared to EKG 10/06/2016.   CHA2DS2-VASc Score is 2 with yearly risk of stroke of 2.2%. (CHF; HTN; vasc disease DM, Male = 1; Age <65 =1; 65-74 = 2, >75 =3; stroke = 3).  Recommendations:   Patient is presently doing well without any complaints today.  He has lost about 20 to 25 pounds since last seen by me 1 year ago.  I have congratulated him on this and provided positive reinforcement.  He continues to wish to lose another 20 pounds.  We have discussed diet changes and regular exercise to help with this.   Tolerating medications well, no bleeding diathesis with Xarelto.  He has not had recent labs and does not have upcoming appointment with PCP.  I will obtain CBC, CMP, and lipids for annual surveillance.  He states his heart rate has been well controlled, he is on rate control therapy for chronic A. fib.  Blood pressure is also well controlled. No changes were made  to medications today,  I will see him back in 6 months or sooner if needed.  Miquel Dunn, MSN, APRN, FNP-C [facility]. [place] Office: [phone] Fax: [phone]

## 2019-06-12 DIAGNOSIS — I119 Hypertensive heart disease without heart failure: Secondary | ICD-10-CM | POA: Insufficient documentation

## 2019-06-12 DIAGNOSIS — I129 Hypertensive chronic kidney disease with stage 1 through stage 4 chronic kidney disease, or unspecified chronic kidney disease: Secondary | ICD-10-CM | POA: Insufficient documentation

## 2019-06-12 DIAGNOSIS — E7800 Pure hypercholesterolemia, unspecified: Secondary | ICD-10-CM | POA: Insufficient documentation

## 2019-07-11 ENCOUNTER — Other Ambulatory Visit: Payer: Self-pay

## 2019-07-11 DIAGNOSIS — E7800 Pure hypercholesterolemia, unspecified: Secondary | ICD-10-CM

## 2019-07-11 DIAGNOSIS — I119 Hypertensive heart disease without heart failure: Secondary | ICD-10-CM

## 2019-07-11 LAB — COMPREHENSIVE METABOLIC PANEL
ALT: 12 IU/L (ref 0–44)
AST: 17 IU/L (ref 0–40)
Albumin/Globulin Ratio: 1.5 (ref 1.2–2.2)
Albumin: 3.9 g/dL (ref 3.8–4.8)
Alkaline Phosphatase: 74 IU/L (ref 39–117)
BUN/Creatinine Ratio: 12 (ref 10–24)
BUN: 15 mg/dL (ref 8–27)
Bilirubin Total: 1.8 mg/dL — ABNORMAL HIGH (ref 0.0–1.2)
CO2: 25 mmol/L (ref 20–29)
Calcium: 9.2 mg/dL (ref 8.6–10.2)
Chloride: 103 mmol/L (ref 96–106)
Creatinine, Ser: 1.22 mg/dL (ref 0.76–1.27)
GFR calc Af Amer: 71 mL/min/{1.73_m2} (ref >59)
GFR calc non Af Amer: 61 mL/min/{1.73_m2} (ref >59)
Globulin, Total: 2.6 g/dL (ref 1.5–4.5)
Glucose: 106 mg/dL — ABNORMAL HIGH (ref 65–99)
Potassium: 4.8 mmol/L (ref 3.5–5.2)
Sodium: 143 mmol/L (ref 134–144)
Total Protein: 6.5 g/dL (ref 6.0–8.5)

## 2019-07-11 LAB — LIPID PANEL
Chol/HDL Ratio: 2.8 ratio (ref 0.0–5.0)
Cholesterol, Total: 143 mg/dL (ref 100–199)
HDL: 51 mg/dL (ref >39)
LDL Chol Calc (NIH): 74 mg/dL (ref 0–99)
Triglycerides: 97 mg/dL (ref 0–149)
VLDL Cholesterol Cal: 18 mg/dL (ref 5–40)

## 2019-07-11 LAB — CBC
Hematocrit: 47.2 % (ref 37.5–51.0)
Hemoglobin: 15.5 g/dL (ref 13.0–17.7)
MCH: 30.6 pg (ref 26.6–33.0)
MCHC: 32.8 g/dL (ref 31.5–35.7)
MCV: 93 fL (ref 79–97)
Platelets: 243 10*3/uL (ref 150–450)
RBC: 5.07 x10E6/uL (ref 4.14–5.80)
RDW: 12.2 % (ref 11.6–15.4)
WBC: 10.2 10*3/uL (ref 3.4–10.8)

## 2019-07-11 MED ORDER — AMLODIPINE BESYLATE 10 MG PO TABS: 10.0000 mg | ORAL_TABLET | Freq: Every day | ORAL | 2 refills | Status: DC

## 2019-07-11 MED ORDER — RIVAROXABAN 20 MG PO TABS: ORAL_TABLET | ORAL | 2 refills | Status: DC

## 2019-07-11 MED ORDER — ROSUVASTATIN CALCIUM 10 MG PO TABS: 10.0000 mg | ORAL_TABLET | Freq: Every day | ORAL | 3 refills | Status: DC

## 2019-07-11 MED ORDER — METOPROLOL TARTRATE 25 MG PO TABS: 25.0000 mg | ORAL_TABLET | Freq: Two times a day (BID) | ORAL | 1 refills | Status: DC

## 2019-07-11 NOTE — Progress Notes (Signed)
CBC, kidney function, and lipids within normal limits

## 2019-07-12 NOTE — Progress Notes (Signed)
Patient aware of results.

## 2019-07-27 ENCOUNTER — Other Ambulatory Visit: Payer: Self-pay

## 2019-07-27 DIAGNOSIS — I119 Hypertensive heart disease without heart failure: Secondary | ICD-10-CM

## 2019-07-27 MED ORDER — ROSUVASTATIN CALCIUM 10 MG PO TABS: 10.0000 mg | ORAL_TABLET | Freq: Every day | ORAL | 3 refills | Status: DC

## 2019-08-03 ENCOUNTER — Other Ambulatory Visit: Payer: Self-pay

## 2019-08-03 DIAGNOSIS — I482 Chronic atrial fibrillation, unspecified: Secondary | ICD-10-CM

## 2019-08-03 MED ORDER — OLMESARTAN MEDOXOMIL 40 MG PO TABS: 40.0000 mg | ORAL_TABLET | Freq: Every day | ORAL | 3 refills | Status: DC

## 2019-12-12 ENCOUNTER — Encounter: Payer: Self-pay | Admitting: Cardiology

## 2019-12-12 ENCOUNTER — Ambulatory Visit: Payer: Medicare Other | Admitting: Cardiology

## 2019-12-12 ENCOUNTER — Other Ambulatory Visit: Payer: Self-pay

## 2019-12-12 VITALS — BP 139/74 | HR 67 | Temp 97.2°F | Resp 14 | Ht 74.0 in | Wt 287.0 lb

## 2019-12-12 DIAGNOSIS — I482 Chronic atrial fibrillation, unspecified: Secondary | ICD-10-CM

## 2019-12-12 DIAGNOSIS — E7800 Pure hypercholesterolemia, unspecified: Secondary | ICD-10-CM

## 2019-12-12 DIAGNOSIS — I447 Left bundle-branch block, unspecified: Secondary | ICD-10-CM

## 2019-12-12 DIAGNOSIS — I119 Hypertensive heart disease without heart failure: Secondary | ICD-10-CM

## 2019-12-12 NOTE — Progress Notes (Signed)
Primary Physician/Referring:  [name], PA-C  Patient ID: [name] Rice, male    DOB: July 27, 1951, 69 y.o.   MRN: [id number]  Chief Complaint  Patient presents with  . Atrial Fibrillation    6 month follow up   HPI:    [name]  is a 69 y.o. with hypertension, hyperlipidemia, obesity, and atrial fibrillation.  Underwent unsuccessful attempt at cardioversion on 09/28/2016. He is asymptomatic in regards to A fib and is on rate control therapy. Has history of hypertension and hypertensive heart disease with abnormal EKG and moderate LVH by echocardiogram in 2017.   Patient presents for 6 month follow-up for chronic atrial fibrillation. Remains asymptomatic and without complaints today. He had previously lost 20-25 lbs, but has recently gained a few pounds.   He is on Xarelto for anticoagulation, no bleeding diathesis reported.  He denies any chest pain, shortness of breath, PND, orthopnea, edema, dizziness, syncope, or symptoms suggestive of claudication or TIA. No headaches or visual disturbances.   Past Medical History:  Diagnosis Date  . Atrial fibrillation (HCC)   . Dysrhythmia   . Hypertension    Past Surgical History:  Procedure Laterality Date  . CARDIOVERSION N/A 09/28/2016   Procedure: CARDIOVERSION;  Surgeon: Yates Decamp, MD;  Location: [facility] ENDOSCOPY;  Service: Cardiovascular;  Laterality: N/A;  . NO PAST SURGERIES     Social History   Tobacco Use  . Smoking status: Never Smoker  . Smokeless tobacco: Never Used  Substance Use Topics  . Alcohol use: No    ROS  Review of Systems  Constitution: Positive for weight gain (gained a few pounds recently). Negative for decreased appetite, malaise/fatigue and weight loss.  Eyes: Negative for visual disturbance.  Cardiovascular: Negative for chest pain, claudication, dyspnea on exertion, leg swelling, orthopnea, palpitations and syncope.  Respiratory: Negative for hemoptysis and wheezing.   Endocrine:  Negative for cold intolerance and heat intolerance.  Hematologic/Lymphatic: Does not bruise/bleed easily.  Skin: Negative for nail changes.  Musculoskeletal: Negative for muscle weakness and myalgias.  Gastrointestinal: Negative for abdominal pain, change in bowel habit, nausea and vomiting.  Neurological: Negative for difficulty with concentration, dizziness, focal weakness and headaches.  Psychiatric/Behavioral: Negative for altered mental status and suicidal ideas.  All other systems reviewed and are negative.  Objective  Blood pressure 139/74, pulse 67, temperature (!) 97.2 F (36.2 C), temperature source Temporal, resp. rate 14, height 6\' 2"  (1.88 m), weight 287 lb (130.2 kg), SpO2 97 %.  Vitals with BMI 12/12/2019 06/11/2019 07/16/2017  Height 6\' 2"  6\' 2"  -  Weight 287 lbs 275 lbs -  BMI 36.83 35.29 -  Systolic 139 127 09/15/2017  Diastolic 74 66 82  Pulse 67 - 70    Physical Exam  Constitutional: He is oriented to person, place, and time. Vital signs are normal. He appears well-developed and well-nourished.  HENT:  Head: Normocephalic and atraumatic.  Cardiovascular: Normal rate, normal heart sounds and intact distal pulses. An irregularly irregular rhythm present.  Pulses:      Femoral pulses are 1+ on the right side and 1+ on the left side.      Popliteal pulses are 1+ on the right side and 1+ on the left side.       Dorsalis pedis pulses are 1+ on the right side and 1+ on the left side.       Posterior tibial pulses are 1+ on the right side and 1+ on the left side.  Pulmonary/Chest:  Effort normal and breath sounds normal. No accessory muscle usage. No respiratory distress.  Abdominal: Soft. Bowel sounds are normal.  Musculoskeletal:        General: Normal range of motion.     Cervical back: Normal range of motion.  Neurological: He is alert and oriented to person, place, and time.  Skin: Skin is warm and dry.  Vitals reviewed.  Laboratory examination:   Recent Labs     07/10/19 0907  NA 143  K 4.8  CL 103  CO2 25  GLUCOSE 106*  BUN 15  CREATININE 1.22  CALCIUM 9.2  GFRNONAA 61  GFRAA 71   CrCl cannot be calculated (Patient's most recent lab result is older than the maximum 21 days allowed.).  CMP Latest Ref Rng & Units 07/10/2019 07/16/2017  Glucose 65 - 99 mg/dL 106(H) 99  BUN 8 - 27 mg/dL 15 16  Creatinine 0.76 - 1.27 mg/dL 1.22 1.17  Sodium 134 - 144 mmol/L 143 138  Potassium 3.5 - 5.2 mmol/L 4.8 4.5  Chloride 96 - 106 mmol/L 103 105  CO2 20 - 29 mmol/L 25 26  Calcium 8.6 - 10.2 mg/dL 9.2 9.0  Total Protein 6.0 - 8.5 g/dL 6.5 7.1  Total Bilirubin 0.0 - 1.2 mg/dL 1.8(H) 1.6(H)  Alkaline Phos 39 - 117 IU/L 74 64  AST 0 - 40 IU/L 17 21  ALT 0 - 44 IU/L 12 16(L)   CBC Latest Ref Rng & Units 07/10/2019 07/16/2017  WBC 3.4 - 10.8 x10E3/uL 10.2 12.3(H)  Hemoglobin 13.0 - 17.7 g/dL 15.5 14.2  Hematocrit 37.5 - 51.0 % 47.2 42.7  Platelets 150 - 450 x10E3/uL 243 253   Lipid Panel     Component Value Date/Time   CHOL 143 07/10/2019 0906   TRIG 97 07/10/2019 0906   HDL 51 07/10/2019 0906   CHOLHDL 2.8 07/10/2019 0906   LDLCALC 74 07/10/2019 0906   HEMOGLOBIN A1C No results found for: HGBA1C, MPG TSH No results for input(s): TSH in the last 8760 hours.  External labs :     Medications and allergies  No Known Allergies   Current Outpatient Medications  Medication Instructions  . amLODipine (NORVASC) 10 mg, Oral, Daily  . metoprolol tartrate (LOPRESSOR) 25 mg, Oral, 2 times daily  . olmesartan (BENICAR) 40 mg, Oral, Daily  . rivaroxaban (XARELTO) 20 MG TABS tablet 1 (ONE) TABLET EVERY EVENING AFTER DINNER  . rosuvastatin (CRESTOR) 10 mg, Oral, Daily    Radiology:  No results found.  Cardiac Studies:   Echocardiogram [08/25/2016]: Left ventricle cavity is normal in size. Moderate concentric hypertrophy of the left ventricle. Normal global wall motion. Visual EF is 55-60%. Indeterminate diastolic filling pattern, indeterminate LAP  due to A. Fib. Left atrial cavity is moderate to severely dilated at 4.9 cm. Right atrial cavity is moderate to severely dilated. Mild (Grade I) mitral regurgitation. Mild tricuspid regurgitation. No evidence of pulmonary hypertension.  Nuclear stress test [09/13/2016]: 1. Resting EKG demonstrates atrial fibrillation with controlled ventricular response, incomplete right bundle branch block, diffuse nonspecific ST-T wave abnormality, cannot exclude inferior and lateral ischemia. Stress EKG was negative for myocardial ischemia. Occasional PVCs were noted. Brief episode of wide-complex tachycardia is most likely an artifact. Patient exercised on Bruce protocol for 5:60minutes and achieved 4.64 METS. Stress test terminated due to dyspnea and 118% MPHR achieved (Target HR >85%). 2. Nuclear perfusion imaging studies demonstrate correct uptake artifact in the inferior wall without demonstrable ischemia or scar. LVEF was calculated at 47%,  visually appears normal. This represents a low risk study.  Cardioversion [09/28/2016]: Unsuccessful with 150x1 & 200J x 2.  Assessment     ICD-10-CM   1. Chronic atrial fibrillation (HCC)  I48.20 EKG 12-Lead    CBC    Comprehensive Metabolic Panel (CMET)  2. Left bundle branch block  I44.7 PCV ECHOCARDIOGRAM COMPLETE  3. Hypertensive heart disease without heart failure  I11.9   4. Pure hypercholesterolemia  E78.00       EKG 12/12/2019: Atrial fibrillation at 58 bpm, normal axis, LBBB, no further analysis due to LBBB. Compared to previous EKG, LBBB is new.  CHA2DS2-VASc Score is 2 with yearly risk of stroke of 2.2%. (CHF; HTN; vasc disease DM, Male = 1; Age <65 =1; 65-74 = 2, >75 =3; stroke = 3).  No orders of the defined types were placed in this encounter.   There are no discontinued medications.   Recommendations:   Patient is here for 6 month office visit and follow up for A fib.  He is presently doing well without any complaints.  Remains  asymptomatic in regards to atrial fibrillation.  A. fib remains rate controlled.  Blood pressure is well controlled with current medications, will continue the same.  I reviewed his lipids that were performed in September, lipids are also well controlled.  He does have newly noted left bundle branch block on EKG.  No symptoms of angina.  Will obtain echocardiogram to exclude any structural abnormalities and will notify him of results.  Do not feel that he needs stress testing in view of lack of symptoms. I will obtain CBC and CMP as it has been sometime since his last labs and needs evaluation every 6 months due to being on anticoagulation.  Encouraged him to continue to work on losing weight. Discussed and regular exercise. I will see him back in 1 year for follow up, or sooner if echocardiogram is markedly abnormal.   Toniann Fail, MSN, APRN, FNP-C [facility] [place] Office: [phone] Fax: [phone]

## 2019-12-14 ENCOUNTER — Telehealth: Payer: Self-pay | Admitting: Cardiology

## 2019-12-14 DIAGNOSIS — I119 Hypertensive heart disease without heart failure: Secondary | ICD-10-CM

## 2019-12-14 LAB — CBC
Hematocrit: 47.8 % (ref 37.5–51.0)
Hemoglobin: 15.9 g/dL (ref 13.0–17.7)
MCH: 30.8 pg (ref 26.6–33.0)
MCHC: 33.3 g/dL (ref 31.5–35.7)
MCV: 93 fL (ref 79–97)
Platelets: 262 10*3/uL (ref 150–450)
RBC: 5.16 x10E6/uL (ref 4.14–5.80)
RDW: 13 % (ref 11.6–15.4)
WBC: 10.3 10*3/uL (ref 3.4–10.8)

## 2019-12-14 LAB — COMPREHENSIVE METABOLIC PANEL
ALT: 11 IU/L (ref 0–44)
AST: 15 IU/L (ref 0–40)
Albumin/Globulin Ratio: 1.7 (ref 1.2–2.2)
Albumin: 4.1 g/dL (ref 3.8–4.8)
Alkaline Phosphatase: 76 IU/L (ref 39–117)
BUN/Creatinine Ratio: 9 — ABNORMAL LOW (ref 10–24)
BUN: 13 mg/dL (ref 8–27)
Bilirubin Total: 1.8 mg/dL — ABNORMAL HIGH (ref 0.0–1.2)
CO2: 22 mmol/L (ref 20–29)
Calcium: 9.2 mg/dL (ref 8.6–10.2)
Chloride: 105 mmol/L (ref 96–106)
Creatinine, Ser: 1.41 mg/dL — ABNORMAL HIGH (ref 0.76–1.27)
GFR calc Af Amer: 60 mL/min/{1.73_m2} (ref >59)
GFR calc non Af Amer: 52 mL/min/{1.73_m2} — ABNORMAL LOW (ref >59)
Globulin, Total: 2.4 g/dL (ref 1.5–4.5)
Glucose: 46 mg/dL — ABNORMAL LOW (ref 65–99)
Potassium: 4.5 mmol/L (ref 3.5–5.2)
Sodium: 143 mmol/L (ref 134–144)
Total Protein: 6.5 g/dL (ref 6.0–8.5)

## 2019-12-14 NOTE — Progress Notes (Signed)
CBC stable. Kidney function is slightly depressed, would recommend that we repeat the lab in 4 weeks

## 2019-12-14 NOTE — Progress Notes (Signed)
Called and spoke with patient regarding his lab results. He will pick up orders when he comes in for his echo appt.

## 2019-12-14 NOTE — Telephone Encounter (Signed)
BMP order placed

## 2019-12-31 ENCOUNTER — Ambulatory Visit: Payer: Medicare Other

## 2019-12-31 ENCOUNTER — Other Ambulatory Visit: Payer: Self-pay

## 2019-12-31 DIAGNOSIS — I447 Left bundle-branch block, unspecified: Secondary | ICD-10-CM

## 2020-01-08 LAB — BASIC METABOLIC PANEL
BUN/Creatinine Ratio: 10 (ref 10–24)
BUN: 14 mg/dL (ref 8–27)
CO2: 23 mmol/L (ref 20–29)
Calcium: 9.2 mg/dL (ref 8.6–10.2)
Chloride: 101 mmol/L (ref 96–106)
Creatinine, Ser: 1.38 mg/dL — ABNORMAL HIGH (ref 0.76–1.27)
GFR calc Af Amer: 61 mL/min/{1.73_m2} (ref >59)
GFR calc non Af Amer: 53 mL/min/{1.73_m2} — ABNORMAL LOW (ref >59)
Glucose: 83 mg/dL (ref 65–99)
Potassium: 5.1 mmol/L (ref 3.5–5.2)
Sodium: 139 mmol/L (ref 134–144)

## 2020-01-08 NOTE — Telephone Encounter (Signed)
Called patient, NA, LMAM to contact our office for recent lab results.

## 2020-01-10 NOTE — Progress Notes (Signed)
Gave results patient verbalized understanding. Patient will call back to set up f/u appointment with Dr. Odis Hollingshead.

## 2020-01-14 ENCOUNTER — Ambulatory Visit: Payer: Medicare Other | Admitting: Cardiology

## 2020-01-14 ENCOUNTER — Other Ambulatory Visit: Payer: Self-pay

## 2020-01-14 ENCOUNTER — Encounter: Payer: Self-pay | Admitting: Cardiology

## 2020-01-14 VITALS — BP 134/68 | HR 61 | Temp 97.8°F | Resp 16 | Ht 73.0 in | Wt 280.0 lb

## 2020-01-14 DIAGNOSIS — I129 Hypertensive chronic kidney disease with stage 1 through stage 4 chronic kidney disease, or unspecified chronic kidney disease: Secondary | ICD-10-CM

## 2020-01-14 DIAGNOSIS — Z7901 Long term (current) use of anticoagulants: Secondary | ICD-10-CM | POA: Insufficient documentation

## 2020-01-14 DIAGNOSIS — R931 Abnormal findings on diagnostic imaging of heart and coronary circulation: Secondary | ICD-10-CM

## 2020-01-14 DIAGNOSIS — I482 Chronic atrial fibrillation, unspecified: Secondary | ICD-10-CM

## 2020-01-14 DIAGNOSIS — E782 Mixed hyperlipidemia: Secondary | ICD-10-CM

## 2020-01-14 DIAGNOSIS — I447 Left bundle-branch block, unspecified: Secondary | ICD-10-CM

## 2020-01-14 NOTE — Progress Notes (Signed)
Primary Physician/Referring:  [name], PA-C  Patient ID: [name], male    DOB: 1950/11/13, 69 y.o.   MRN: [id number]  Chief Complaint  Patient presents with  . Atrial Fibrillation  . Follow-up    Echo results   HPI:    [name] Rice  is a 69 y.o. with hypertension, hyperlipidemia, obesity, left bundle branch block, mildly reduced left ventricular systolic function, and atrial fibrillation.  Follow-up on echo results: At last office visit patient was noted to have a newly diagnosed left bundle branch block.  Because the patient was asymptomatic from a cardiovascular standpoint echocardiogram was performed.  Echocardiogram shows mildly reduced left ventricular systolic function with regional wall motion abnormalities.  Details noted below for further reference.  Patient denies chest pain or shortness of breath at rest or with effort related activities.  He denies orthopnea or paroxysmal nocturnal dyspnea.  Patient's overall functional status has not changed compared to his baseline.  Atrial fibrillation: Patient's ventricular rate is well controlled.  Patient is currently on Xarelto and tolerating well.  Patient does not endorse any evidence of bleeding. Underwent unsuccessful attempt at cardioversion on 09/28/2016.  Past Medical History:  Diagnosis Date  . Atrial fibrillation (HCC)   . Dysrhythmia   . Hypertension   . LBBB (left bundle branch block)    Past Surgical History:  Procedure Laterality Date  . CARDIOVERSION N/A 09/28/2016   Procedure: CARDIOVERSION;  Surgeon: Yates Decamp, MD;  Location: [facility] ENDOSCOPY;  Service: Cardiovascular;  Laterality: N/A;  . NO PAST SURGERIES     Social History   Tobacco Use  . Smoking status: Never Smoker  . Smokeless tobacco: Never Used  Substance Use Topics  . Alcohol use: No    ROS  Review of Systems  Constitution: Negative for decreased appetite, malaise/fatigue, weight gain and weight loss.  Eyes: Negative for  visual disturbance.  Cardiovascular: Negative for chest pain, claudication, dyspnea on exertion, leg swelling, orthopnea, palpitations and syncope.  Respiratory: Negative for hemoptysis and wheezing.   Endocrine: Negative for cold intolerance and heat intolerance.  Hematologic/Lymphatic: Does not bruise/bleed easily.  Skin: Negative for nail changes.  Musculoskeletal: Negative for muscle weakness and myalgias.  Gastrointestinal: Negative for abdominal pain, change in bowel habit, nausea and vomiting.  Neurological: Negative for difficulty with concentration, dizziness, focal weakness and headaches.  Psychiatric/Behavioral: Negative for altered mental status and suicidal ideas.  All other systems reviewed and are negative.  Objective  Blood pressure 134/68, pulse 61, temperature 97.8 F (36.6 C), temperature source Temporal, resp. rate 16, height 6\' 1"  (1.854 m), weight 280 lb (127 kg), SpO2 98 %.  Vitals with BMI 01/14/2020 12/12/2019 06/11/2019  Height 6\' 1"  6\' 2"  6\' 2"   Weight 280 lbs 287 lbs 275 lbs  BMI 36.95 36.83 35.29  Systolic 134 139 08/11/2019  Diastolic 68 74 66  Pulse 61 67 -    Physical Exam  Constitutional: He is oriented to person, place, and time. Vital signs are normal. He appears well-developed and well-nourished.  HENT:  Head: Normocephalic and atraumatic.  Cardiovascular: Normal rate, normal heart sounds and intact distal pulses. An irregularly irregular rhythm present.  Pulses:      Femoral pulses are 1+ on the right side and 1+ on the left side.      Popliteal pulses are 1+ on the right side and 1+ on the left side.       Dorsalis pedis pulses are 1+ on the right side and  1+ on the left side.       Posterior tibial pulses are 1+ on the right side and 1+ on the left side.  Pulmonary/Chest: Effort normal and breath sounds normal. No accessory muscle usage. No respiratory distress.  Abdominal: Soft. Bowel sounds are normal.  Musculoskeletal:        General: Normal range of  motion.     Cervical back: Normal range of motion.  Neurological: He is alert and oriented to person, place, and time.  Skin: Skin is warm and dry.  Vitals reviewed.  Laboratory examination:   Recent Labs    07/10/19 0907 12/13/19 0936 01/07/20 1103  NA 143 143 139  K 4.8 4.5 5.1  CL 103 105 101  CO2 25 22 23   GLUCOSE 106* 46* 83  BUN 15 13 14   CREATININE 1.22 1.41* 1.38*  CALCIUM 9.2 9.2 9.2  GFRNONAA 61 52* 53*  GFRAA 71 60 61   estimated creatinine clearance is 73.5 mL/min (A) (by C-G formula based on SCr of 1.38 mg/dL (H)).  CMP Latest Ref Rng & Units 01/07/2020 12/13/2019 07/10/2019  Glucose 65 - 99 mg/dL 83 46(L) 106(H)  BUN 8 - 27 mg/dL 14 13 15   Creatinine 0.76 - 1.27 mg/dL 1.38(H) 1.41(H) 1.22  Sodium 134 - 144 mmol/L 139 143 143  Potassium 3.5 - 5.2 mmol/L 5.1 4.5 4.8  Chloride 96 - 106 mmol/L 101 105 103  CO2 20 - 29 mmol/L 23 22 25   Calcium 8.6 - 10.2 mg/dL 9.2 9.2 9.2  Total Protein 6.0 - 8.5 g/dL - 6.5 6.5  Total Bilirubin 0.0 - 1.2 mg/dL - 1.8(H) 1.8(H)  Alkaline Phos 39 - 117 IU/L - 76 74  AST 0 - 40 IU/L - 15 17  ALT 0 - 44 IU/L - 11 12   CBC Latest Ref Rng & Units 12/13/2019 07/10/2019 07/16/2017  WBC 3.4 - 10.8 x10E3/uL 10.3 10.2 12.3(H)  Hemoglobin 13.0 - 17.7 g/dL 15.9 15.5 14.2  Hematocrit 37.5 - 51.0 % 47.8 47.2 42.7  Platelets 150 - 450 x10E3/uL 262 243 253   Lipid Panel     Component Value Date/Time   CHOL 143 07/10/2019 0906   TRIG 97 07/10/2019 0906   HDL 51 07/10/2019 0906   CHOLHDL 2.8 07/10/2019 0906   LDLCALC 74 07/10/2019 0906   Medications and allergies  No Known Allergies   Current Outpatient Medications  Medication Instructions  . amLODipine (NORVASC) 10 mg, Oral, Daily  . metoprolol tartrate (LOPRESSOR) 25 mg, Oral, 2 times daily  . olmesartan (BENICAR) 40 mg, Oral, Daily  . rivaroxaban (XARELTO) 20 MG TABS tablet 1 (ONE) TABLET EVERY EVENING AFTER DINNER  . rosuvastatin (CRESTOR) 10 mg, Oral, Daily    Radiology:  No results  found.  Cardiac Studies:   Echocardiogram [08/25/2016]: Left ventricle cavity is normal in size. Moderate concentric hypertrophy of the left ventricle. Normal global wall motion. Visual EF is 55-60%. Indeterminate diastolic filling pattern, indeterminate LAP due to A. Fib. Left atrial cavity is moderate to severely dilated at 4.9 cm. Right atrial cavity is moderate to severely dilated. Mild (Grade I) mitral regurgitation. Mild tricuspid regurgitation. No evidence of pulmonary hypertension.  12/31/2019: LVEF 45-50%, mildly reduced, with regional wall motion abnormalities.Left ventricle regional wall motion findings: Mid anteroseptal, Mid inferoseptal, Apical anterior, Apical septal and Apical cap hypokinesis. Mild left ventricular hypertrophy. Left atrial cavity is severely dilated. Right atrial cavity is slightly dilated. Right ventricle cavity is normal in size. Low normal right ventricular  function. Mild tricuspid regurgitation. Mild pulmonary hypertension RVSP measures 35 mmHg.   Nuclear stress test [09/13/2016]: 1. Resting EKG demonstrates atrial fibrillation with controlled ventricular response, incomplete right bundle branch block, diffuse nonspecific ST-T wave abnormality, cannot exclude inferior and lateral ischemia. Stress EKG was negative for myocardial ischemia. Occasional PVCs were noted. Brief episode of wide-complex tachycardia is most likely an artifact. Patient exercised on Bruce protocol for 5:7minutes and achieved 4.64 METS. Stress test terminated due to dyspnea and 118% MPHR achieved (Target HR >85%). 2. Nuclear perfusion imaging studies demonstrate correct uptake artifact in the inferior wall without demonstrable ischemia or scar. LVEF was calculated at 47%, visually appears normal. This represents a low risk study.  Cardioversion [09/28/2016]: Unsuccessful with 150x1 & 200J x 2.  Assessment     ICD-10-CM   1. Abnormal echocardiogram  R93.1 PCV MYOCARDIAL PERFUSION WITH  LEXISCAN  2. Left bundle branch block  I44.7 PCV MYOCARDIAL PERFUSION WITH LEXISCAN  3. Chronic atrial fibrillation (HCC)  I48.20   4. Long term current use of anticoagulant  Z79.01   5. Benign hypertension with CKD (chronic kidney disease) stage III  I12.9    N18.30   6. Mixed hyperlipidemia  E78.2     EKG 12/12/2019: Atrial fibrillation at 58 bpm, normal axis, LBBB, no further analysis due to LBBB. Compared to previous EKG, LBBB is new.  CHA2DS2-VASc Score is 3 with yearly risk of stroke of 3.2%.   No orders of the defined types were placed in this encounter.   There are no discontinued medications.   Recommendations:  Check his regular 16-month follow-up for management of atrial fibrillation at last office visit patient was noted to have left bundle branch block.  Since the patient was asymptomatic he underwent a echocardiogram for further risk stratification.  Patient's LVEF is mildly reduced with regional wall motion abnormalities most likely consistent with underlying left bundle branch block.  However given the newly reduced left ventricular systolic function and regional wall motion abnormalities recommend an ischemic evaluation.  Patient is agreement with proceeding with Lexiscan to rule out reversible ischemia.  Continue current medical therapy.  Patient doing well for his underlying atrial fibrillation.  Patient is on AV nodal blocking agents for rate control.  He is currently on Xarelto for thromboembolic prophylaxis.  Patient does not endorse any evidence of bleeding.  Continue to monitor.  Mixed hyperlipidemia: Continue statin therapy. Follow lipids. Patient denies myalgia or other side effects.  Evaluation and management ( ) with time spent obtaining history, performing exam, reviewing labs, studies, greater than 50% of that time was spent in counseling and coordination care with the patient regarding complex decision making and discussion as state above, and documenting  clinical evaluation.  Tessa Lerner, DO, [facility] [place] Office: [phone]

## 2020-01-14 NOTE — Patient Instructions (Signed)
Please remember to bring in your medication bottles in at the next visit.   New Medications that were added at today's visit:  None  Medications that were discontinued at today's visit: None  Office will call you to have the following tests scheduled:  Stress Test   Recommend follow up with your PCP as scheduled.

## 2020-01-16 ENCOUNTER — Other Ambulatory Visit: Payer: Self-pay | Admitting: Cardiology

## 2020-01-16 DIAGNOSIS — I119 Hypertensive heart disease without heart failure: Secondary | ICD-10-CM

## 2020-02-18 ENCOUNTER — Ambulatory Visit: Payer: Medicare Other

## 2020-02-18 ENCOUNTER — Other Ambulatory Visit: Payer: Self-pay

## 2020-02-18 DIAGNOSIS — R931 Abnormal findings on diagnostic imaging of heart and coronary circulation: Secondary | ICD-10-CM

## 2020-02-18 DIAGNOSIS — I447 Left bundle-branch block, unspecified: Secondary | ICD-10-CM

## 2020-02-25 ENCOUNTER — Telehealth: Payer: Self-pay

## 2020-02-25 NOTE — Telephone Encounter (Signed)
-----   Message from [place], [place] sent at 02/24/2020 11:41 PM EDT ----- Please have the patient make a follow-up appointment to review test results.  In the interim if he has any chest pain and/or shortness of breath at rest or with effort related activities he needs to seek medical attention at the closest ER via EMS.

## 2020-02-25 NOTE — Progress Notes (Signed)
Called patient and left a message on VM to call and schedule appt. for test results.

## 2020-02-25 NOTE — Telephone Encounter (Signed)
I schedule the patient for 03/05/20 at 11:45am with dr Odis Hollingshead- patient has not been called yet. If this patient cannot make it at this time and date, please send call to me at ext [phone] or send me a message ill call the patient back.   Thank you  -Cleotis Nipper

## 2020-02-25 NOTE — Telephone Encounter (Signed)
Please call and schedule patient for a follow up appt on their results. Thanks!

## 2020-02-26 NOTE — Telephone Encounter (Signed)
Left vm to cb.

## 2020-02-27 ENCOUNTER — Telehealth: Payer: Self-pay

## 2020-03-05 ENCOUNTER — Ambulatory Visit: Payer: Medicare Other | Admitting: Cardiology

## 2020-03-05 ENCOUNTER — Other Ambulatory Visit: Payer: Self-pay

## 2020-03-05 ENCOUNTER — Encounter: Payer: Self-pay | Admitting: Cardiology

## 2020-03-05 VITALS — BP 135/71 | HR 56 | Temp 97.3°F | Ht 73.0 in | Wt 288.0 lb

## 2020-03-05 DIAGNOSIS — N1832 Chronic kidney disease, stage 3b: Secondary | ICD-10-CM

## 2020-03-05 DIAGNOSIS — I4821 Permanent atrial fibrillation: Secondary | ICD-10-CM | POA: Insufficient documentation

## 2020-03-05 DIAGNOSIS — I4891 Unspecified atrial fibrillation: Secondary | ICD-10-CM | POA: Insufficient documentation

## 2020-03-05 DIAGNOSIS — I129 Hypertensive chronic kidney disease with stage 1 through stage 4 chronic kidney disease, or unspecified chronic kidney disease: Secondary | ICD-10-CM

## 2020-03-05 DIAGNOSIS — Z712 Person consulting for explanation of examination or test findings: Secondary | ICD-10-CM

## 2020-03-05 DIAGNOSIS — I447 Left bundle-branch block, unspecified: Secondary | ICD-10-CM

## 2020-03-05 DIAGNOSIS — E782 Mixed hyperlipidemia: Secondary | ICD-10-CM

## 2020-03-05 DIAGNOSIS — Z7901 Long term (current) use of anticoagulants: Secondary | ICD-10-CM

## 2020-03-05 DIAGNOSIS — I4819 Other persistent atrial fibrillation: Secondary | ICD-10-CM

## 2020-03-05 NOTE — Progress Notes (Signed)
[name] Rice Date of Birth: Mar 26, 1951 MRN: [id number] Primary Care Provider:Kaplan, Baldemar Friday., PA-C Former Cardiology Providers: Dr. Marthenia Rolling, APRN, FNP-C Primary Cardiologist: Rex Kras, DO, [facility] (established care 01/14/2020)  Date: 03/09/20 Last Office Visit: 01/14/2020  Chief Complaint  Patient presents with  . Follow-up  . Results    HPI:    [name] Rice  is a 69 y.o. with hypertension, hyperlipidemia, obesity, left bundle branch block, mildly reduced left ventricular systolic function, and atrial fibrillation.  Patient had originally presented to the office for follow-up with Jeri Lager, APRN, FNP-C at that visit he was diagnosed with newly discovered left bundle branch block.  Since he was asymptomatic in regards to any cardiac symptoms an echocardiogram was performed to evaluate for structural heart disease.  Echocardiogram noted mildly reduced left ventricular systolic function with regional wall motion abnormalities which is new compared to prior studies.  Given the new left bundle branch block and decreased LV function with regional wall motion normalities patient was recommended to undergo nuclear stress test to rule out reversible ischemia.    Since last office visit patient has undergone nuclear stress test which was reported to be a low risk study.  However, I independently reviewed the still SPECT images which is concerning for reversible perfusion defect.  I explained this to the patient and showed him the MPI images and he understands that the perfusion defect could be secondary to LBBB or underlying heart disease (I.e. blockage). Patient states that despite the new findings of LBBB, change in LVEF, and MPI results he does not feel any different and still able to do this activities without any symptoms of chest pain or shortness of breath. Therefore, would like to continue with conservative management for now unless change in clinical condition or  has new symptoms of chest pain or shortness of breath.   Atrial fibrillation: Patient's ventricular rate is well controlled.  Patient is currently on Xarelto and tolerating well.  Patient does not endorse any evidence of bleeding. Underwent unsuccessful attempt at cardioversion on 09/28/2016.  Past Medical History:  Diagnosis Date  . A-fib ([place])   . Atrial fibrillation ([place])   . Dysrhythmia   . Hypertension   . LBBB (left bundle branch block)    Past Surgical History:  Procedure Laterality Date  . CARDIOVERSION N/A 09/28/2016   Procedure: CARDIOVERSION;  Surgeon: Adrian Prows, MD;  Location: [place];  Service: Cardiovascular;  Laterality: N/A;  . NO PAST SURGERIES     Social History   Tobacco Use  . Smoking status: Never Smoker  . Smokeless tobacco: Never Used  Substance Use Topics  . Alcohol use: No    ROS  Review of Systems  Constitution: Negative for decreased appetite, malaise/fatigue, weight gain and weight loss.  Eyes: Negative for visual disturbance.  Cardiovascular: Negative for chest pain, claudication, dyspnea on exertion, leg swelling, orthopnea, palpitations and syncope.  Respiratory: Negative for hemoptysis and wheezing.   Endocrine: Negative for cold intolerance and heat intolerance.  Hematologic/Lymphatic: Does not bruise/bleed easily.  Skin: Negative for nail changes.  Musculoskeletal: Negative for muscle weakness and myalgias.  Gastrointestinal: Negative for abdominal pain, change in bowel habit, nausea and vomiting.  Neurological: Negative for difficulty with concentration, dizziness, focal weakness and headaches.  Psychiatric/Behavioral: Negative for altered mental status and suicidal ideas.  All other systems reviewed and are negative.  Objective   Vitals with BMI 03/05/2020 01/14/2020 12/12/2019  Height 6\' 1"  6\' 1"  6\' 2"   Weight 288 lbs 280 lbs 287 lbs  BMI 38.01 36.95 36.83  Systolic 135 134 481  Diastolic 71 68 74  Pulse 56 61 67    Physical  Exam  Constitutional: He is oriented to person, place, and time. Vital signs are normal. He appears well-developed and well-nourished.  HENT:  Head: Normocephalic and atraumatic.  Cardiovascular: Normal rate, normal heart sounds and intact distal pulses. An irregularly irregular rhythm present.  Pulses:      Femoral pulses are 1+ on the right side and 1+ on the left side.      Popliteal pulses are 1+ on the right side and 1+ on the left side.       Dorsalis pedis pulses are 1+ on the right side and 1+ on the left side.       Posterior tibial pulses are 1+ on the right side and 1+ on the left side.  Pulmonary/Chest: Effort normal and breath sounds normal. No accessory muscle usage. No respiratory distress.  Abdominal: Soft. Bowel sounds are normal.  Musculoskeletal:        General: Normal range of motion.     Cervical back: Normal range of motion.  Neurological: He is alert and oriented to person, place, and time.  Skin: Skin is warm and dry.  Vitals reviewed.  Laboratory examination:   Recent Labs    07/10/19 0907 12/13/19 0936 01/07/20 1103  NA 143 143 139  K 4.8 4.5 5.1  CL 103 105 101  CO2 25 22 23   GLUCOSE 106* 46* 83  BUN 15 13 14   CREATININE 1.22 1.41* 1.38*  CALCIUM 9.2 9.2 9.2  GFRNONAA 61 52* 53*  GFRAA 71 60 61   CrCl cannot be calculated (Patient's most recent lab result is older than the maximum 21 days allowed.).  CMP Latest Ref Rng & Units 01/07/2020 12/13/2019 07/10/2019  Glucose 65 - 99 mg/dL 83 02/10/2020) 09/09/2019)  BUN 8 - 27 mg/dL 14 13 15   Creatinine 0.76 - 1.27 mg/dL 85(U) 314(H)  Sodium 134 - 144 mmol/L 139 143 143  Potassium 3.5 - 5.2 mmol/L 5.1 4.5 4.8  Chloride 96 - 106 mmol/L 101 105 103  CO2 20 - 29 mmol/L 23 22 25   Calcium 8.6 - 10.2 mg/dL 9.2 9.2 9.2  Total Protein 6.0 - 8.5 g/dL - 6.5 6.5  Total Bilirubin 0.0 - 1.2 mg/dL - 1.8(H) 1.8(H)  Alkaline Phos 39 - 117 IU/L - 76 74  AST 0 - 40 IU/L - 15 17  ALT 0 - 44 IU/L - 11 12   CBC Latest Ref  Rng & Units 12/13/2019 07/10/2019 07/16/2017  WBC 3.4 - 10.8 x10E3/uL 10.3 10.2 12.3(H)  Hemoglobin 13.0 - 17.7 g/dL 02/10/2020 09/09/2019  Hematocrit 37.5 - 51.0 % 47.8 47.2 42.7  Platelets 150 - 450 x10E3/uL 262 243 253   Lipid Panel     Component Value Date/Time   CHOL 143 07/10/2019 0906   TRIG 97 07/10/2019 0906   HDL 51 07/10/2019 0906   CHOLHDL 2.8 07/10/2019 0906   LDLCALC 74 07/10/2019 0906   Medications and allergies  No Known Allergies   Current Outpatient Medications  Medication Instructions  . amLODipine (NORVASC) 10 mg, Oral, Daily  . metoprolol tartrate (LOPRESSOR) 25 MG tablet TAKE 1 TABLET BY MOUTH  TWICE DAILY  . olmesartan (BENICAR) 40 mg, Oral, Daily  . rivaroxaban (XARELTO) 20 MG TABS tablet 1 (ONE) TABLET EVERY EVENING AFTER DINNER  . rosuvastatin (CRESTOR) 10 mg, Oral,  Daily    Radiology:  No results found.  Cardiac Studies:  EKG 12/12/2019: Atrial fibrillation at 58 bpm, normal axis, LBBB, no further analysis due to LBBB. Compared to previous EKG, LBBB is new.  Echocardiogram : 08/25/2016: Left ventricle cavity is normal in size. Moderate concentric hypertrophy of the left ventricle. Normal global wall motion. Visual EF is 55-60%. Indeterminate diastolic filling pattern, indeterminate LAP due to A. Fib. Left atrial cavity is moderate to severely dilated at 4.9 cm. Right atrial cavity is moderate to severely dilated. Mild (Grade I) mitral regurgitation. Mild tricuspid regurgitation. No evidence of pulmonary hypertension.  12/31/2019: LVEF 45-50%, mildly reduced, with regional wall motion abnormalities.Left ventricle regional wall motion findings: Mid anteroseptal, Mid inferoseptal, Apical anterior, Apical septal and Apical cap hypokinesis. Mild left ventricular hypertrophy. Left atrial cavity is severely dilated. Right atrial cavity is slightly dilated. Right ventricle cavity is normal in size. Low normal right ventricular function. Mild tricuspid regurgitation. Mild  pulmonary hypertension RVSP measures 35 mmHg.   Nuclear stress test: 02/18/2020 Lexiscan nuclear stress test: Stress EKG is non-diagnostic, as this is pharmacological stress test. In addition, rest and stress EKG showed atrial fibrillation with slow ventricular response, anterolateral T wave inversion. Stress LVEF 77%. Normal myocardial perfusion. Low risk study.  Cardioversion [09/28/2016]: Unsuccessful with 150x1 & 200J x 2.  Assessment     ICD-10-CM   1. Encounter to discuss test results  Z71.2   2. Persistent atrial fibrillation (HCC)  I48.19 CANCELED: EKG 12-Lead  3. Long term current use of anticoagulant  Z79.01   4. Left bundle branch block  I44.7   5. Benign hypertension with CKD (chronic kidney disease) stage III  I12.9    N18.30   6. Mixed hyperlipidemia  E78.2   7. Stage 3b chronic kidney disease  N18.32      Recommendations:  WORTHY BOSCHERT is a 69 y.o. male whose past medical history and cardiac risk factors include: hypertension, hyperlipidemia, obesity, left bundle branch block, mildly reduced left ventricular systolic function, and atrial fibrillation.  Encounter to review test results: Patient was found to have a new left bundle branch block during his prior visits and underwent an echocardiogram.  The echocardiogram noted reduced left ventricular systolic function with regional wall motion abnormalities.  As result he was recommended to undergo nuclear stress test to rule out reversible ischemia.  Nuclear stress test was reported to be overall low risk study.  However, I independently reviewed the still images of the SPECT perfusion resistance training for reversible ischemia either secondary to underlying LBBB or obstructive disease.  We discussed undergoing left heart catheterization to rule out obstructive coronary disease.  However, patient refuses to undergo angiography at the current time.  However repeat would like to continue monitoring symptoms and medical  therapy.  In regards to alternatives were discussed cardiac CTA but this would not be the best next choice given the fact that he has underlying A. Fib.  Recommend close follow-up.  He is also educated on importance of seeking medical attention by going to the closest ER via EMS if she has symptoms concerning for chest pain or anginal equivalent.  Persistent atrial fibrillation: Remains asymptomatic and rate controlled.  Patient is on AV nodal blocking agents for rate control.   He is currently on Xarelto for thromboembolic prophylaxis.  Patient does not endorse any evidence of bleeding.  Continue to monitor.  Mixed hyperlipidemia: Continue statin therapy. Follow lipids. Patient denies myalgia or other side effects.  --Continue  cardiac medications as reconciled in final medication list. --Return in about 3 months (around 06/04/2020) for re-evaluation of symptoms . Or sooner if needed. --Continue follow-up with your primary care physician regarding the management of your other chronic comorbid conditions.  Patient's questions and concerns were addressed to his satisfaction. He voices understanding of the instructions provided during this encounter.   This note was created using a voice recognition software as a result there may be grammatical errors inadvertently enclosed that do not reflect the nature of this encounter. Every attempt is made to correct such errors.  Tessa Lerner, [place], [facility]  Pager: [phone] Office: [phone]

## 2020-04-01 ENCOUNTER — Other Ambulatory Visit: Payer: Self-pay | Admitting: Cardiology

## 2020-04-01 DIAGNOSIS — I119 Hypertensive heart disease without heart failure: Secondary | ICD-10-CM

## 2020-06-04 ENCOUNTER — Other Ambulatory Visit: Payer: Self-pay

## 2020-06-04 ENCOUNTER — Encounter: Payer: Self-pay | Admitting: Cardiology

## 2020-06-04 ENCOUNTER — Ambulatory Visit: Payer: Medicare Other | Admitting: Cardiology

## 2020-06-04 VITALS — BP 135/75 | HR 63 | Ht 73.0 in | Wt 270.0 lb

## 2020-06-04 DIAGNOSIS — N183 Chronic kidney disease, stage 3 unspecified: Secondary | ICD-10-CM

## 2020-06-04 DIAGNOSIS — I447 Left bundle-branch block, unspecified: Secondary | ICD-10-CM

## 2020-06-04 DIAGNOSIS — N1832 Chronic kidney disease, stage 3b: Secondary | ICD-10-CM

## 2020-06-04 DIAGNOSIS — Z7901 Long term (current) use of anticoagulants: Secondary | ICD-10-CM

## 2020-06-04 DIAGNOSIS — E782 Mixed hyperlipidemia: Secondary | ICD-10-CM

## 2020-06-04 DIAGNOSIS — I129 Hypertensive chronic kidney disease with stage 1 through stage 4 chronic kidney disease, or unspecified chronic kidney disease: Secondary | ICD-10-CM

## 2020-06-04 DIAGNOSIS — I4819 Other persistent atrial fibrillation: Secondary | ICD-10-CM

## 2020-06-04 NOTE — Patient Instructions (Signed)
Labs on or after  Jan 24th 2021.

## 2020-06-04 NOTE — Progress Notes (Signed)
[name] BankerJames R Manter Date of Birth: 08/16/1951 MRN: [id number] Primary Care Provider:Kaplan, Isidor HoltsKristen W., PA-C Former Cardiology Providers: Dr. Rudean HittJay Ganji/Ashton Kelley, APRN, FNP-C Primary Cardiologist: Tessa LernerSunit Pier Laux, DO, [facility] (established care 01/14/2020)  Date: 06/04/20 Last Office Visit: 03/05/2020  Chief Complaint  Patient presents with  . Atrial Fibrillation  . Follow-up    3 month, hx of abnormal stress test.     HPI:    [name] Rice  is a 69 y.o. with hypertension, hyperlipidemia, obesity, left bundle branch block, mildly reduced left ventricular systolic function, and atrial fibrillation presents to the office with a chief complaint of "6173-month follow-up in regards to atrial fibrillation management and abnormal nuclear stress test."  Patient had originally presented to the office for follow-up with Altamese CarolinaAshton Kelley, APRN, FNP-C at that visit he was diagnosed with newly discovered left bundle branch block.  Since he was asymptomatic in regards to any cardiac symptoms an echocardiogram was performed to evaluate for structural heart disease.  Echocardiogram noted mildly reduced left ventricular systolic function with regional wall motion abnormalities which is new compared to prior studies.  Given the new left bundle branch block and decreased LV function with regional wall motion normalities patient was recommended to undergo nuclear stress test to rule out reversible ischemia.  The nuclear stress test was reported to be a low risk study.  However, I independently reviewed the still SPECT images which is concerning for possible reversible perfusion defect.  I explained this to the patient and showed him the MPI images and he understands that the perfusion defect could be secondary to LBBB or underlying heart disease (I.e. blockage). Patient states that despite the new findings of LBBB, change in LVEF, and MPI results he does not feel any different and still able to do this activities without any  symptoms of chest pain or shortness of breath. Therefore, would like to continue with conservative management for now unless change in clinical condition or has new symptoms of chest pain or shortness of breath.   Since last visit patient states that he is doing well from a cardiovascular standpoint.  He has increased his physical activity by walking 30 minutes a day 5 days a week.  He is also changed his diet and has been successful with 18 pound weight loss since last visit.  He is congratulated on his efforts and success.  Patient states that he would like to continue with medical management for his abnormal stress test.    Atrial fibrillation: Patient's ventricular rate is well controlled.  Patient is currently on Xarelto and tolerating well.  Patient does not endorse any evidence of bleeding. Underwent unsuccessful attempt at cardioversion on 09/28/2016.  Patient states that his home blood pressures are usually better controlled with systolic blood pressures ranging between 126-130 mmHg.  And diastolic blood pressures usually less than 80 mmHg.  Patient is compliant with his medical therapy.  No hospitalizations since last office visit.  He did have an urgent care visit due to ear pain and was prescribed antibiotics which is now resolved.  Past Medical History:  Diagnosis Date  . A-fib (HCC)   . Atrial fibrillation (H[facility])   . Dysrhythmia   . Hypertension   . LBBB (left bundle branch block)    Past Surgical History:  Procedure Laterality Date  . CARDIOVERSION N/A 09/28/2016   Procedure: CARDIOVERSION;  Surgeon: Yates DecampJay Ganji, MD;  [facility];  Service: Cardiovascular;  Laterality: N/A;  . NO PAST SURGERIES  Social History   Tobacco Use  . Smoking status: Never Smoker  . Smokeless tobacco: Never Used  Substance Use Topics  . Alcohol use: No    ROS  Review of Systems  Constitutional: Negative for decreased appetite, malaise/fatigue, weight gain and weight loss.  Eyes:  Negative for visual disturbance.  Cardiovascular: Negative for chest pain, claudication, dyspnea on exertion, leg swelling, orthopnea, palpitations and syncope.  Respiratory: Negative for hemoptysis and wheezing.   Endocrine: Negative for cold intolerance and heat intolerance.  Hematologic/Lymphatic: Does not bruise/bleed easily.  Skin: Negative for nail changes.  Musculoskeletal: Negative for muscle weakness and myalgias.  Gastrointestinal: Negative for abdominal pain, change in bowel habit, nausea and vomiting.  Neurological: Negative for difficulty with concentration, dizziness, focal weakness and headaches.  Psychiatric/Behavioral: Negative for altered mental status and suicidal ideas.  All other systems reviewed and are negative.  Objective   Vitals with BMI 06/04/2020 03/05/2020 01/14/2020  Height 6\' 1"  6\' 1"  6\' 1"   Weight 270 lbs 288 lbs 280 lbs  BMI 35.63 38.01 36.95  Systolic 135 135  Diastolic 75 71 68  Pulse 63 56 61    Physical Exam Vitals reviewed.  Constitutional:      Appearance: He is well-developed.  HENT:     Head: Normocephalic and atraumatic.  Cardiovascular:     Rate and Rhythm: Normal rate. Rhythm irregularly irregular.     Pulses: Intact distal pulses.          Femoral pulses are 1+ on the right side and 1+ on the left side.      Popliteal pulses are 1+ on the right side and 1+ on the left side.       Dorsalis pedis pulses are 1+ on the right side and 1+ on the left side.       Posterior tibial pulses are 1+ on the right side and 1+ on the left side.     Heart sounds: Normal heart sounds.  Pulmonary:     Effort: Pulmonary effort is normal. No accessory muscle usage or respiratory distress.     Breath sounds: Normal breath sounds.  Abdominal:     General: Bowel sounds are normal.     Palpations: Abdomen is soft.  Musculoskeletal:        General: Normal range of motion.     Cervical back: Normal range of motion.  Skin:    General: Skin is warm and  dry.  Neurological:     Mental Status: He is alert and oriented to person, place, and time.    Laboratory examination:   Recent Labs    07/10/19 0907 12/13/19 0936 01/07/20 1103  NA 143 143 139  K 4.8 4.5 5.1  CL 103 105 101  CO2 25 22 23   GLUCOSE 106* 46* 83  BUN 15 13 14   CREATININE 1.22 1.41* 1.38*  CALCIUM 9.2 9.2 9.2  GFRNONAA 61 52* 53*  GFRAA 71 60 61   CrCl cannot be calculated (Patient's most recent lab result is older than the maximum 21 days allowed.).  CMP Latest Ref Rng & Units 01/07/2020 12/13/2019 07/10/2019  Glucose 65 - 99 mg/dL 83 ) )  BUN 8 - 27 mg/dL 14 13 15   Creatinine 0.76 - 1.27 mg/dL 03/08/2020) 02/10/2020) 09/09/2019  Sodium 134 - 144 mmol/L 139 143 143  Potassium 3.5 - 5.2 mmol/L 5.1 4.5 4.8  Chloride 96 - 106 mmol/L 101 105 103  CO2 20 - 29 mmol/L 23 22 25  Calcium 8.6 - 10.2 mg/dL 9.2 9.2 9.2  Total Protein 6.0 - 8.5 g/dL - 6.5 6.5  Total Bilirubin 0.0 - 1.2 mg/dL - 1.8(H) 1.8(H)  Alkaline Phos 39 - 117 IU/L - 76 74  AST 0 - 40 IU/L - 15 17  ALT 0 - 44 IU/L - 11 12   CBC Latest Ref Rng & Units 12/13/2019 07/10/2019 07/16/2017  WBC 3.4 - 10.8 x10E3/uL 10.3 10.2 12.3(H)  Hemoglobin 13.0 - 17.7 g/dL 24.5 80.9 98.3  Hematocrit 37.5 - 51.0 % 47.8 47.2 42.7  Platelets 150 - 450 x10E3/uL 262 243 253   Lipid Panel     Component Value Date/Time   CHOL 143 07/10/2019 0906   TRIG 97 07/10/2019 0906   HDL 51 07/10/2019 0906   CHOLHDL 2.8 07/10/2019 0906   LDLCALC 74 07/10/2019 0906   Medications and allergies  No Known Allergies   Current Outpatient Medications  Medication Instructions  . amLODipine (NORVASC) 10 MG tablet TAKE 1 TABLET BY MOUTH  DAILY  . metoprolol tartrate (LOPRESSOR) 25 MG tablet TAKE 1 TABLET BY MOUTH  TWICE DAILY  . olmesartan (BENICAR) 40 mg, Oral, Daily  . rosuvastatin (CRESTOR) 10 mg, Oral, Daily  . XARELTO 20 MG TABS tablet TAKE 1 TABLET BY MOUTH IN  THE EVENING AFTER DINNER    Radiology:  No results found.  Cardiac  Studies:  EKG 12/12/2019: Atrial fibrillation at 58 bpm, normal axis, LBBB, no further analysis due to LBBB. Compared to previous EKG, LBBB is new.  Echocardiogram : 08/25/2016: Left ventricle cavity is normal in size. Moderate concentric hypertrophy of the left ventricle. Normal global wall motion. Visual EF is 55-60%. Indeterminate diastolic filling pattern, indeterminate LAP due to A. Fib. Left atrial cavity is moderate to severely dilated at 4.9 cm. Right atrial cavity is moderate to severely dilated. Mild (Grade I) mitral regurgitation. Mild tricuspid regurgitation. No evidence of pulmonary hypertension.  12/31/2019: LVEF 45-50%, mildly reduced, with regional wall motion abnormalities.Left ventricle regional wall motion findings: Mid anteroseptal, Mid inferoseptal, Apical anterior, Apical septal and Apical cap hypokinesis. Mild left ventricular hypertrophy. Left atrial cavity is severely dilated. Right atrial cavity is slightly dilated. Right ventricle cavity is normal in size. Low normal right ventricular function. Mild tricuspid regurgitation. Mild pulmonary hypertension RVSP measures 35 mmHg.   Nuclear stress test: 02/18/2020 Lexiscan nuclear stress test: Stress EKG is non-diagnostic, as this is pharmacological stress test. In addition, rest and stress EKG showed atrial fibrillation with slow ventricular response, anterolateral T wave inversion. Stress LVEF 77%. Normal myocardial perfusion. Low risk study.  Cardioversion [09/28/2016]: Unsuccessful with 150x1 & 200J x 2.  Assessment     ICD-10-CM   1. Persistent atrial fibrillation (HCC)  I48.19   2. Long term current use of anticoagulant  Z79.01 Hemoglobin and hematocrit, blood  3. Left bundle branch block  I44.7   4. Benign hypertension with CKD (chronic kidney disease) stage III  I12.9 Basic metabolic panel   J82.50 Magnesium  5. Stage 3b chronic kidney disease  N18.32   6. Mixed hyperlipidemia  E78.2      Recommendations:    KIMBER ESTERLY is a 69 y.o. male whose past medical history and cardiac risk factors include: hypertension, hyperlipidemia, obesity, left bundle branch block, mildly reduced left ventricular systolic function, and atrial fibrillation.  Persistent atrial fibrillation:  Remains asymptomatic and rate controlled.   Patient is on AV nodal blocking agents for rate control.    He is currently on Xarelto for  thromboembolic prophylaxis.  Patient does not endorse any evidence of bleeding.  Continue to monitor.  We will check hemoglobin and hematocrit, BMP, and magnesium prior to t he next office visit.  Mixed hyperlipidemia: Continue statin therapy. Follow lipids. Patient denies myalgia or other side effects.  History of abnormal nuclear stress test:   Given the new left bundle branch block and decreased LV function with regional wall motion normalities patient was recommended to undergo nuclear stress test to rule out reversible ischemia.  The nuclear stress test was reported to be a low risk study.  However, I independently reviewed the still SPECT images which is concerning for possible reversible perfusion defect.  I explained this to the patient and showed him the MPI images and he understands that the perfusion defect could be secondary to LBBB or underlying heart disease (I.e. blockage). Patient states that despite the new findings of LBBB, change in LVEF, and MPI results he does not feel any different and still able to do this activities without any symptoms of chest pain or shortness of breath. Therefore, would like to continue with conservative management for now unless change in clinical condition or has new symptoms of chest pain or shortness of breath.   Since last visit patient states that he is doing well from a cardiovascular standpoint.  He has increased his physical activity by walking 30 minutes a day 5 days a week.  He is also changed his diet and has been successful with 18 pound  weight loss since last visit.  He is congratulated on his efforts and success.  Patient states that he would like to continue with medical management for his abnormal stress test.    --Continue cardiac medications as reconciled in final medication list. --Return in about 6 months (around 12/09/2020) for afib follow up. hx of abnormal stress test, labs 12/01/2020. Or sooner if needed. --Continue follow-up with your primary care physician regarding the management of your other chronic comorbid conditions.  Patient's questions and concerns were addressed to his satisfaction. He voices understanding of the instructions provided during this encounter.   This note was created using a voice recognition software as a result there may be grammatical errors inadvertently enclosed that do not reflect the nature of this encounter. Every attempt is made to correct such errors.  Tessa Lerner, [place], [facility]  Pager: [phone] Office: [phone]

## 2020-07-02 ENCOUNTER — Other Ambulatory Visit: Payer: Self-pay | Admitting: Cardiology

## 2020-07-02 ENCOUNTER — Emergency Department (HOSPITAL_COMMUNITY)
Admission: EM | Admit: 2020-07-02 | Discharge: 2020-07-02 | Disposition: A | Discharge status: Home / Self Care | Payer: Medicare Other | Attending: Emergency Medicine | Admitting: Emergency Medicine

## 2020-07-02 ENCOUNTER — Emergency Department (HOSPITAL_COMMUNITY): Payer: Medicare Other

## 2020-07-02 DIAGNOSIS — Z20822 Contact with and (suspected) exposure to covid-19: Secondary | ICD-10-CM | POA: Diagnosis not present

## 2020-07-02 DIAGNOSIS — R001 Bradycardia, unspecified: Secondary | ICD-10-CM | POA: Insufficient documentation

## 2020-07-02 DIAGNOSIS — N183 Chronic kidney disease, stage 3 unspecified: Secondary | ICD-10-CM | POA: Diagnosis not present

## 2020-07-02 DIAGNOSIS — R55 Syncope and collapse: Secondary | ICD-10-CM

## 2020-07-02 DIAGNOSIS — I119 Hypertensive heart disease without heart failure: Secondary | ICD-10-CM

## 2020-07-02 DIAGNOSIS — I129 Hypertensive chronic kidney disease with stage 1 through stage 4 chronic kidney disease, or unspecified chronic kidney disease: Secondary | ICD-10-CM | POA: Insufficient documentation

## 2020-07-02 DIAGNOSIS — I482 Chronic atrial fibrillation, unspecified: Secondary | ICD-10-CM

## 2020-07-02 LAB — CBC WITH DIFFERENTIAL/PLATELET
Abs Immature Granulocytes: 0.07 10*3/uL (ref 0.00–0.07)
Basophils Absolute: 0 10*3/uL (ref 0.0–0.1)
Basophils Relative: 0 %
Eosinophils Absolute: 0.3 10*3/uL (ref 0.0–0.5)
Eosinophils Relative: 2 %
HCT: 41.9 % (ref 39.0–52.0)
Hemoglobin: 12.7 g/dL — ABNORMAL LOW (ref 13.0–17.0)
Immature Granulocytes: 1 %
Lymphocytes Relative: 11 %
Lymphs Abs: 1.2 10*3/uL (ref 0.7–4.0)
MCH: 28.5 pg (ref 26.0–34.0)
MCHC: 30.3 g/dL (ref 30.0–36.0)
MCV: 94.2 fL (ref 80.0–100.0)
Monocytes Absolute: 0.8 10*3/uL (ref 0.1–1.0)
Monocytes Relative: 8 %
Neutro Abs: 8.4 10*3/uL — ABNORMAL HIGH (ref 1.7–7.7)
Neutrophils Relative %: 78 %
Platelets: 331 10*3/uL (ref 150–400)
RBC: 4.45 MIL/uL (ref 4.22–5.81)
RDW: 14.2 % (ref 11.5–15.5)
WBC: 10.8 10*3/uL — ABNORMAL HIGH (ref 4.0–10.5)
nRBC: 0 % (ref 0.0–0.2)

## 2020-07-02 LAB — URINALYSIS, ROUTINE W REFLEX MICROSCOPIC
Bilirubin Urine: NEGATIVE
Glucose, UA: NEGATIVE mg/dL
Hgb urine dipstick: NEGATIVE
Ketones, ur: NEGATIVE mg/dL
Leukocytes,Ua: NEGATIVE
Nitrite: NEGATIVE
Protein, ur: NEGATIVE mg/dL
Specific Gravity, Urine: 1.016 (ref 1.005–1.030)
pH: 5 (ref 5.0–8.0)

## 2020-07-02 LAB — BASIC METABOLIC PANEL
Anion gap: 12 (ref 5–15)
BUN: 13 mg/dL (ref 8–23)
CO2: 23 mmol/L (ref 22–32)
Calcium: 8.7 mg/dL — ABNORMAL LOW (ref 8.9–10.3)
Chloride: 102 mmol/L (ref 98–111)
Creatinine, Ser: 1.36 mg/dL — ABNORMAL HIGH (ref 0.61–1.24)
GFR calc Af Amer: >60 mL/min (ref >60)
GFR calc non Af Amer: 53 mL/min — ABNORMAL LOW (ref >60)
Glucose, Bld: 158 mg/dL — ABNORMAL HIGH (ref 70–99)
Potassium: 4.3 mmol/L (ref 3.5–5.1)
Sodium: 137 mmol/L (ref 135–145)

## 2020-07-02 LAB — SARS CORONAVIRUS 2 BY RT PCR (HOSPITAL ORDER, PERFORMED IN ~~LOC~~ HOSPITAL LAB): SARS Coronavirus 2: NEGATIVE

## 2020-07-02 LAB — CBG MONITORING, ED: Glucose-Capillary: 140 mg/dL — ABNORMAL HIGH (ref 70–99)

## 2020-07-02 LAB — TROPONIN I (HIGH SENSITIVITY)
Troponin I (High Sensitivity): 9 ng/L (ref <18)
Troponin I (High Sensitivity): 8 ng/L (ref <18)

## 2020-07-02 LAB — MAGNESIUM: Magnesium: 2 mg/dL (ref 1.7–2.4)

## 2020-07-02 LAB — TSH: TSH: 1.898 u[IU]/mL (ref 0.350–4.500)

## 2020-07-02 MED ORDER — SODIUM CHLORIDE 0.9 % IV BOLUS
500.0000 mL | Freq: Once | INTRAVENOUS | Status: AC
  Administered 2020-07-02: 16:00:00 500 mL via INTRAVENOUS

## 2020-07-02 NOTE — ED Provider Notes (Signed)
Emergency Department Provider Note   I have reviewed the triage vital signs and the nursing notes.   HISTORY  Chief Complaint Loss of Consciousness and Bradycardia   HPI [name] Rice is a 69 y.o. male with past medical history of chronic atrial fibrillation on anticoagulation, hypertension, and CKD presents to the emergency department with near syncope today while eating lunch.  Patient was out at a restaurant when he suddenly felt very hot and lightheaded.  He did not fully pass out and denies feeling palpitations or chest pain.  He states that the manager of the restaurant felt like he did not look very good and was diaphoretic.  She called EMS who came out and evaluated the patient to find him with an irregular heart rate in the 30s.  Patient notes he has had similar symptoms in the past with the last episode being approximately 3 months ago.  He is followed by [facility] cardiology.  He last saw them approximately 1 month ago.  He does take metoprolol for rate control of his atrial fibrillation with no recent dose changes.  Pliant with his Xarelto.  Past Medical History:  Diagnosis Date  . A-fib (HCC)   . Atrial fibrillation (HCC)   . Dysrhythmia   . Hypertension   . LBBB (left bundle branch block)     Patient Active Problem List   Diagnosis Date Noted  . A-fib (HCC)   . [name] Rice term current use of anticoagulant 01/14/2020  . Benign hypertension with CKD (chronic kidney disease) stage III 06/12/2019  . Pure hypercholesterolemia 06/12/2019  . Atrial fibrillation (HCC) 09/25/2016    Past Surgical History:  Procedure Laterality Date  . CARDIOVERSION N/A 09/28/2016   Procedure: CARDIOVERSION;  Surgeon: Yates Decamp, MD;  Location: [facility] ENDOSCOPY;  Service: Cardiovascular;  Laterality: N/A;  . NO PAST SURGERIES      Allergies Patient has no known allergies.  No family history on file.  Social History Social History   Tobacco Use  . Smoking status: Never Smoker  .  Smokeless tobacco: Never Used  Vaping Use  . Vaping Use: Never used  Substance Use Topics  . Alcohol use: No  . Drug use: No    Review of Systems  Constitutional: No fever/chills Eyes: No visual changes. ENT: No sore throat. Cardiovascular: Denies chest pain. Positive near syncope and diaphoresis.  Respiratory: Denies shortness of breath. Gastrointestinal: No abdominal pain.  No nausea, no vomiting.  No diarrhea.  No constipation. Genitourinary: Negative for dysuria. Musculoskeletal: Negative for back pain. Skin: Negative for rash. Neurological: Negative for headaches, focal weakness or numbness.  10-point ROS otherwise negative.  ____________________________________________   PHYSICAL EXAM:  VITAL SIGNS: ED Triage Vitals [07/02/20 1541]  Enc Vitals Group     BP 122/82     Pulse Rate (!) 50     Resp 18     Temp 97.6 F (36.4 C)     Temp src      SpO2 99 %   Constitutional: Alert and oriented. Well appearing and in no acute distress. Eyes: Conjunctivae are normal. Head: Atraumatic. Nose: No congestion/rhinnorhea. Mouth/Throat: Mucous membranes are moist.   Neck: No stridor.  Cardiovascular: Slow A-fib. Good peripheral circulation. Grossly normal heart sounds.   Respiratory: Normal respiratory effort.  No retractions. Lungs CTAB. Gastrointestinal: Soft and nontender. No distention.  Musculoskeletal: No lower extremity tenderness nor edema. No gross deformities of extremities. Neurologic:  Normal speech and language. No gross focal neurologic deficits are appreciated.  Skin:  Skin is warm, dry and intact. No rash noted.  ____________________________________________   LABS (all labs ordered are listed, but only abnormal results are displayed)  Labs Reviewed  BASIC METABOLIC PANEL - Abnormal; Notable for the following components:      Result Value   Glucose, Bld 158 (*)    Creatinine, Ser 1.36 (*)    Calcium 8.7 (*)    GFR calc non Af Amer 53 (*)    All  other components within normal limits  CBC WITH DIFFERENTIAL/PLATELET - Abnormal; Notable for the following components:   WBC 10.8 (*)    Hemoglobin 12.7 (*)    Neutro Abs 8.4 (*)    All other components within normal limits  URINALYSIS, ROUTINE W REFLEX MICROSCOPIC - Abnormal; Notable for the following components:   Color, Urine AMBER (*)    APPearance HAZY (*)    All other components within normal limits  CBG MONITORING, ED - Abnormal; Notable for the following components:   Glucose-Capillary 140 (*)    All other components within normal limits  SARS CORONAVIRUS 2 BY RT PCR (HOSPITAL ORDER, PERFORMED IN [facility])  MAGNESIUM  TSH  TROPONIN I (HIGH SENSITIVITY)  TROPONIN I (HIGH SENSITIVITY)   ____________________________________________  EKG   EKG Interpretation  Date/Time:  Wednesday July 02 2020 15:39:41 EDT Ventricular Rate:  63 PR Interval:    QRS Duration: 154 QT Interval:  441 QTC Calculation: 455 R Axis:   -130 Text Interpretation: Atrial fibrillation Nonspecific intraventricular conduction delay Probable lateral infarct, age indeterminate Probable anteroseptal infarct, recent Baseline wander in lead(s) V3 No prior for comparison No STEMI Confirmed by Alona Bene [phone]) on 07/02/2020 3:42:24 PM       ____________________________________________  RADIOLOGY  CXR reviewed.  ____________________________________________   PROCEDURES  Procedure(s) performed:   Procedures  None  ____________________________________________   INITIAL IMPRESSION / ASSESSMENT AND PLAN / ED COURSE  Pertinent labs & imaging results that were available during my care of the patient were reviewed by me and considered in my medical decision making (see chart for details).   Patient presents the emergency department for evaluation of near syncope with slow atrial fibrillation upon EMS arrival.  The rhythm strips are at bedside and reviewed showing rate which is  slow with [place] Zarazua pauses.  His rate here in the department is fluctuating between the high 40s and mid 60s with normal blood pressure.  He is awake and alert.  Zoll pads applied but not requiring external pacing or other interventions at this time.  Plan for labs and imaging and will discuss with [facility] cardiology regarding the patient's symptoms.   Patient's lab work, serial troponins, chest x-ray reviewed.  Heart rate mainly in the 60s to 50s.  Patient is feeling well and ambulatory in the department.  No severe hypotension.  Discussed the case with Dr. Jacinto Halim who is the patient's cardiologist.  He would like for [place] to hold the patient's metoprolol and his office can call in the morning to schedule a follow-up appointment tomorrow.  Patient is comfortable with this plan and will be discharged with strict ED return precautions. ____________________________________________  FINAL CLINICAL IMPRESSION(S) / ED DIAGNOSES  Final diagnoses:  Near syncope  Bradycardia     MEDICATIONS GIVEN DURING THIS VISIT:  Medications  sodium chloride 0.9 % bolus 500 mL (0 mLs Intravenous Stopped 07/02/20 1643)    Note:  This document was prepared using Dragon voice recognition software and may include unintentional dictation  errors.  Alona Bene, MD, [facility] Emergency Medicine    Donzel Romack, Arlyss Repress, MD 07/07/20 [phone]

## 2020-07-02 NOTE — Discharge Instructions (Signed)
You were seen in the emerge department today with almost passing out.  Your heart rate is low and we would like for you to stop taking your metoprolol until you see your cardiologist tomorrow.  Please call Dr. Verl Dicker office in the morning and they will fit you into their schedule tomorrow.  If you develop any new or suddenly worsening symptoms please return to the emergency department immediately.

## 2020-07-02 NOTE — ED Notes (Signed)
Patient verbalizes understanding of discharge instructions. Opportunity for questioning and answers were provided. Armband removed by staff, pt discharged from ED to home 

## 2020-07-02 NOTE — ED Triage Notes (Signed)
Pt brought in by EMS from home for near syncopal episode. On EMS arrival pt found to be in afib at a rate of 30. Pt poor historian, states he has had similar episodes but has not sought tx. Per EMS pt stated his physicians discussed with him the possibility of needing a pacemaker.

## 2020-07-03 ENCOUNTER — Other Ambulatory Visit: Payer: Self-pay

## 2020-07-03 ENCOUNTER — Ambulatory Visit: Payer: Medicare Other

## 2020-07-03 ENCOUNTER — Encounter: Payer: Self-pay | Admitting: Cardiology

## 2020-07-03 ENCOUNTER — Ambulatory Visit: Payer: Medicare Other | Admitting: Cardiology

## 2020-07-03 VITALS — BP 143/75 | HR 64 | Resp 17 | Ht 73.0 in | Wt 267.0 lb

## 2020-07-03 DIAGNOSIS — I4819 Other persistent atrial fibrillation: Secondary | ICD-10-CM

## 2020-07-03 DIAGNOSIS — Z7901 Long term (current) use of anticoagulants: Secondary | ICD-10-CM

## 2020-07-03 DIAGNOSIS — I447 Left bundle-branch block, unspecified: Secondary | ICD-10-CM

## 2020-07-03 DIAGNOSIS — R001 Bradycardia, unspecified: Secondary | ICD-10-CM

## 2020-07-03 DIAGNOSIS — R55 Syncope and collapse: Secondary | ICD-10-CM

## 2020-07-03 DIAGNOSIS — I129 Hypertensive chronic kidney disease with stage 1 through stage 4 chronic kidney disease, or unspecified chronic kidney disease: Secondary | ICD-10-CM

## 2020-07-03 DIAGNOSIS — N1831 Chronic kidney disease, stage 3a: Secondary | ICD-10-CM

## 2020-07-03 DIAGNOSIS — E782 Mixed hyperlipidemia: Secondary | ICD-10-CM

## 2020-07-03 NOTE — Progress Notes (Signed)
[name] Date of Birth: [birth year] MRN: [id number] Primary Care Provider:Kaplan, Isidor Holts., PA-C Former Cardiology Providers: Dr. Rudean Hitt, APRN, FNP-C Primary Cardiologist: Tessa Lerner, DO, [facility] (established care 01/14/2020)  Date: 07/03/2020 Last Office Visit: 06/04/2020   Chief Complaint  Patient presents with   Persistant Atrial Fibrillation   Bradycardia   Hospitalization Follow-up    HPI:    [name] Rice  is a 69 y.o. with hypertension, hyperlipidemia, obesity, left bundle branch block, mildly reduced left ventricular systolic function, and atrial fibrillation presents to the office with a chief complaint of "for hospital follow up."   In the recent past patient was noted to have newly discovered left bundle branch block and underwent an evaluation with an echocardiogram.  The echocardiogram noted mildly reduced left ventricular systolic function with regional wall motion abnormalities.  Subsequently underwent a nuclear stress test which was independently reviewed by myself.  There appears to be a perfusion defect which could be secondary to left bundle branch block or underlying heart disease (I.e. blockage). Patient wanted to proceed with medial management unless change in clinical status.   Patient states that recently he had a meeting at his church and that morning he did not have breakfast or take his morning medications.  When he returned he took all his antihypertensive medications and went out to lunch.  Patient states that when he was waiting for his lunch he felt "very hot all over." He was told that he did not look well EMS was called.  EMT noted that he had  low blood pressure and heart rate.  He was taken to the ER for further evaluation and management.  In the ER patient was noted to have normal blood work, high sensitive troponins negative x2, orthostatic vital signs were normal per patient, and no documented syncopal events.  He was  discharged home and asked to follow-up with cardiology as outpatient.  Since discharge patient has not had reoccurrence of symptoms.  Currently denies any chest pain or shortness of breath at rest or with effort related activities.  Patient states that when EMS was called EMT noted that his heart rate was around 30 bpm.  Atrial fibrillation: Patient's ventricular rate is well controlled.  Patient is currently on Xarelto and tolerating well.  Patient does not endorse any evidence of bleeding. Underwent unsuccessful attempt at cardioversion on 09/28/2016.  Past Medical History:  Diagnosis Date   A-fib [facility])    Atrial fibrillation (HCC)    Dysrhythmia    Hypertension    LBBB (left bundle branch block)    Past Surgical History:  Procedure Laterality Date   CARDIOVERSION N/A 09/28/2016   Procedure: CARDIOVERSION;  Surgeon: Yates Decamp, MD;  Location: [facility] ENDOSCOPY;  Service: Cardiovascular;  Laterality: N/A;   NO PAST SURGERIES     Social History   Tobacco Use   Smoking status: Never Smoker   Smokeless tobacco: Never Used  Substance Use Topics   Alcohol use: No    ROS  Review of Systems  Constitutional: Negative for decreased appetite, malaise/fatigue, weight gain and weight loss.  Eyes: Negative for visual disturbance.  Cardiovascular: Negative for chest pain, claudication, dyspnea on exertion, leg swelling, orthopnea, palpitations and syncope.  Respiratory: Negative for hemoptysis and wheezing.   Endocrine: Negative for cold intolerance and heat intolerance.  Hematologic/Lymphatic: Does not bruise/bleed easily.  Skin: Negative for nail changes.  Musculoskeletal: Negative for muscle weakness and myalgias.  Gastrointestinal: Negative for abdominal pain, change  in bowel habit, nausea and vomiting.  Neurological: Negative for difficulty with concentration, dizziness, focal weakness and headaches.  Psychiatric/Behavioral: Negative for altered mental status and suicidal  ideas.  All other systems reviewed and are negative.  Objective   Today's Vitals   07/03/20 1023  BP: (!) 143/75  Pulse: 64  Resp: 17  SpO2: 99%  Weight: 267 lb (121.1 kg)  Height: 6\' 1"  (1.854 m)   Body mass index is 35.23 kg/m.  Orthostatic vital signs: Supine: 141/72, pulse 52 Sitting up: 123/73, pulse of 66 Standing up: 135/69, pulse 86  Physical Exam Vitals reviewed.  Constitutional:      Appearance: He is well-developed.  HENT:     Head: Normocephalic and atraumatic.  Cardiovascular:     Rate and Rhythm: Normal rate. Rhythm irregularly irregular.     Pulses: Intact distal pulses.          Femoral pulses are 1+ on the right side and 1+ on the left side.      Popliteal pulses are 1+ on the right side and 1+ on the left side.       Dorsalis pedis pulses are 1+ on the right side and 1+ on the left side.       Posterior tibial pulses are 1+ on the right side and 1+ on the left side.     Heart sounds: Normal heart sounds.  Pulmonary:     Effort: Pulmonary effort is normal. No accessory muscle usage or respiratory distress.     Breath sounds: Normal breath sounds.  Abdominal:     General: Bowel sounds are normal.     Palpations: Abdomen is soft.  Musculoskeletal:        General: Normal range of motion.     Cervical back: Normal range of motion.  Skin:    General: Skin is warm and dry.  Neurological:     Mental Status: He is alert and oriented to person, place, and time.    Laboratory examination:   Recent Labs    12/13/19 0936 01/07/20 1103 07/02/20 1604  NA 143 139 137  K 4.5 5.1 4.3  CL 105 101 102  CO2 22 23 23   GLUCOSE 46* 83 158*  BUN 13 14 13   CREATININE 1.41* 1.38* 1.36*  CALCIUM 9.2 9.2 8.7*  GFRNONAA 52* 53* 53*  GFRAA 60 61 >60   estimated creatinine clearance is 71.9 mL/min (A) (by C-G formula based on SCr of 1.36 mg/dL (H)).  CMP Latest Ref Rng & Units 07/02/2020 01/07/2020 12/13/2019  Glucose 70 - 99 mg/dL 161(W158(H) 83 96(E46(L)  BUN 8 - 23 mg/dL  13 14 13   Creatinine 0.61 - 1.24 mg/dL 4.54(U1.36(H) 9.81(X1.38(H) 9.14(N1.41(H)  Sodium 135 - 145 mmol/L 137 139 143  Potassium 3.5 - 5.1 mmol/L 4.3 5.1 4.5  Chloride 98 - 111 mmol/L 102 101 105  CO2 22 - 32 mmol/L 23 23 22   Calcium 8.9 - 10.3 mg/dL 8.2(N8.7(L) 9.2 9.2  Total Protein 6.0 - 8.5 g/dL - - 6.5  Total Bilirubin 0.0 - 1.2 mg/dL - - 1.8(H)  Alkaline Phos 39 - 117 IU/L - - 76  AST 0 - 40 IU/L - - 15  ALT 0 - 44 IU/L - - 11   CBC Latest Ref Rng & Units 07/02/2020 12/13/2019 07/10/2019  WBC 4.0 - 10.5 K/uL 10.8(H) 10.3 10.2  Hemoglobin 13.0 - 17.0 g/dL 12.7(L) 15.9 15.5  Hematocrit 39 - 52 % 41.9 47.8 47.2  Platelets 150 - 400  K/uL 331 262 243   Lipid Panel     Component Value Date/Time   CHOL 143 07/10/2019 0906   TRIG 97 07/10/2019 0906   HDL 51 07/10/2019 0906   CHOLHDL 2.8 07/10/2019 0906   LDLCALC 74 07/10/2019 0906   Medications and allergies  No Known Allergies   Current Outpatient Medications  Medication Instructions   amLODipine (NORVASC) 10 MG tablet TAKE 1 TABLET BY MOUTH  DAILY   metoprolol tartrate (LOPRESSOR) 25 MG tablet TAKE 1 TABLET BY MOUTH  TWICE DAILY   olmesartan (BENICAR) 40 MG tablet TAKE 1 TABLET BY MOUTH  DAILY   rosuvastatin (CRESTOR) 10 MG tablet TAKE 1 TABLET BY MOUTH  DAILY   XARELTO 20 MG TABS tablet TAKE 1 TABLET BY MOUTH IN  THE EVENING AFTER DINNER    Radiology:  No results found.  Cardiac Studies:  EKG 12/12/2019: Atrial fibrillation at 58 bpm, normal axis, LBBB, no further analysis due to LBBB. Compared to previous EKG, LBBB is new.  Echocardiogram : 08/25/2016: Left ventricle cavity is normal in size. Moderate concentric hypertrophy of the left ventricle. Normal global wall motion. Visual EF is 55-60%. Indeterminate diastolic filling pattern, indeterminate LAP due to A. Fib. Left atrial cavity is moderate to severely dilated at 4.9 cm. Right atrial cavity is moderate to severely dilated. Mild (Grade I) mitral regurgitation. Mild tricuspid  regurgitation. No evidence of pulmonary hypertension.  12/31/2019: LVEF 45-50%, mildly reduced, with regional wall motion abnormalities.Left ventricle regional wall motion findings: Mid anteroseptal, Mid inferoseptal, Apical anterior, Apical septal and Apical cap hypokinesis. Mild left ventricular hypertrophy. Left atrial cavity is severely dilated. Right atrial cavity is slightly dilated. Right ventricle cavity is normal in size. Low normal right ventricular function. Mild tricuspid regurgitation. Mild pulmonary hypertension RVSP measures 35 mmHg.   Nuclear stress test: 02/18/2020 Lexiscan nuclear stress test: Stress EKG is non-diagnostic, as this is pharmacological stress test. In addition, rest and stress EKG showed atrial fibrillation with slow ventricular response, anterolateral T wave inversion. Stress LVEF 77%. Normal myocardial perfusion. Low risk study.  Cardioversion [09/28/2016]: Unsuccessful with 150x1 & 200J x 2.  Assessment     ICD-10-CM   1. Near syncope  R55 LONG TERM MONITOR (3-14 DAYS)    EKG 12-Lead  2. Bradycardia  R00.1 LONG TERM MONITOR (3-14 DAYS)  3. Persistent atrial fibrillation (HCC)  I48.19 LONG TERM MONITOR (3-14 DAYS)  4. Long term current use of anticoagulant  Z79.01   5. Left bundle branch block  I44.7   6. Benign hypertension with CKD (chronic kidney disease) stage III  I12.9    N18.30   7. Stage 3a chronic kidney disease  N18.31   8. Mixed hyperlipidemia  E78.2      Recommendations:  [name] Rice is a 69 y.o. male whose past medical history and cardiac risk factors include: hypertension, hyperlipidemia, obesity, left bundle branch block, mildly reduced left ventricular systolic function, and atrial fibrillation.  Hospital follow-up:  Patient's recent episode of going to the ER was probably secondary to vasovagal episode.  Orthostatic vitals negative at today's office visit.   Patient's instructed to change positions slowly, keeping himself  well-hydrated, continue medications regularly, eating heart healthy diet 3 times a day, and minimal increase in salt intake.  Compression stockings recommended if symptoms continue.  Patient does have underlying atrial fibrillation and given the fact that his recent heart rate by ENT was documented beyond 30 bpm would recommend 14-day extended Holter monitor to evaluate for dysrhythmias  or pause.   Near syncope: See above  Persistent atrial fibrillation:  Remains asymptomatic and rate controlled.   Will hold AV nodal blocking agents due to near syncope and bradycardia.   He is currently on Xarelto for thromboembolic prophylaxis.  Patient does not endorse any evidence of bleeding.  Continue to monitor.  Mixed hyperlipidemia: Continue statin therapy. Follow lipids. Patient denies myalgia or other side effects.  History of abnormal nuclear stress test:   Given the new left bundle branch block and decreased LV function with regional wall motion normalities patient was recommended to undergo nuclear stress test to rule out reversible ischemia.  The nuclear stress test was reported to be a low risk study.  However, I independently reviewed the still SPECT images which is concerning for possible reversible perfusion defect.  I explained this to the patient and showed him the MPI images and he understands that the perfusion defect could be secondary to LBBB or underlying heart disease (I.e. blockage).   He still wants to manage it medically for now.   However, due to the recent ER visit he may consider undergoing left heart cath if his symptoms re-surface.  --Continue cardiac medications as reconciled in final medication list. --Return in about 4 weeks (around 07/31/2020) for afib, bradycardia, near syncope follow up.. Or sooner if needed. --Continue follow-up with your primary care physician regarding the management of your other chronic comorbid conditions.  Patient's questions and concerns  were addressed to his satisfaction. He voices understanding of the instructions provided during this encounter.   This note was created using a voice recognition software as a result there may be grammatical errors inadvertently enclosed that do not reflect the nature of this encounter. Every attempt is made to correct such errors.  Tessa Lerner, [place], [facility]  Pager: [phone] Office: [phone]

## 2020-07-23 ENCOUNTER — Telehealth: Payer: Self-pay

## 2020-07-23 ENCOUNTER — Inpatient Hospital Stay (HOSPITAL_COMMUNITY)
Admission: EM | Admit: 2020-07-23 | Discharge: 2020-08-05 | DRG: 229 | Disposition: A | Discharge status: Skilled Nursing Facility | Payer: Medicare Other | Attending: Cardiothoracic Surgery | Admitting: Cardiothoracic Surgery

## 2020-07-23 DIAGNOSIS — Z20822 Contact with and (suspected) exposure to covid-19: Secondary | ICD-10-CM | POA: Diagnosis present

## 2020-07-23 DIAGNOSIS — E8809 Other disorders of plasma-protein metabolism, not elsewhere classified: Secondary | ICD-10-CM | POA: Diagnosis present

## 2020-07-23 DIAGNOSIS — I440 Atrioventricular block, first degree: Secondary | ICD-10-CM | POA: Diagnosis not present

## 2020-07-23 DIAGNOSIS — R001 Bradycardia, unspecified: Secondary | ICD-10-CM | POA: Diagnosis present

## 2020-07-23 DIAGNOSIS — E860 Dehydration: Secondary | ICD-10-CM | POA: Diagnosis present

## 2020-07-23 DIAGNOSIS — E8770 Fluid overload, unspecified: Secondary | ICD-10-CM | POA: Diagnosis not present

## 2020-07-23 DIAGNOSIS — I5022 Chronic systolic (congestive) heart failure: Secondary | ICD-10-CM | POA: Diagnosis present

## 2020-07-23 DIAGNOSIS — N179 Acute kidney failure, unspecified: Secondary | ICD-10-CM | POA: Diagnosis present

## 2020-07-23 DIAGNOSIS — E785 Hyperlipidemia, unspecified: Secondary | ICD-10-CM | POA: Diagnosis present

## 2020-07-23 DIAGNOSIS — Z01818 Encounter for other preprocedural examination: Secondary | ICD-10-CM

## 2020-07-23 DIAGNOSIS — J9811 Atelectasis: Secondary | ICD-10-CM | POA: Diagnosis not present

## 2020-07-23 DIAGNOSIS — Z8249 Family history of ischemic heart disease and other diseases of the circulatory system: Secondary | ICD-10-CM

## 2020-07-23 DIAGNOSIS — Z79899 Other long term (current) drug therapy: Secondary | ICD-10-CM

## 2020-07-23 DIAGNOSIS — I447 Left bundle-branch block, unspecified: Secondary | ICD-10-CM | POA: Diagnosis present

## 2020-07-23 DIAGNOSIS — I2510 Atherosclerotic heart disease of native coronary artery without angina pectoris: Secondary | ICD-10-CM

## 2020-07-23 DIAGNOSIS — E876 Hypokalemia: Secondary | ICD-10-CM | POA: Diagnosis present

## 2020-07-23 DIAGNOSIS — Z7901 Long term (current) use of anticoagulants: Secondary | ICD-10-CM

## 2020-07-23 DIAGNOSIS — Z6835 Body mass index (BMI) 35.0-35.9, adult: Secondary | ICD-10-CM

## 2020-07-23 DIAGNOSIS — D62 Acute posthemorrhagic anemia: Secondary | ICD-10-CM | POA: Diagnosis not present

## 2020-07-23 DIAGNOSIS — E7800 Pure hypercholesterolemia, unspecified: Secondary | ICD-10-CM | POA: Diagnosis present

## 2020-07-23 DIAGNOSIS — I4891 Unspecified atrial fibrillation: Secondary | ICD-10-CM | POA: Diagnosis present

## 2020-07-23 DIAGNOSIS — Z9889 Other specified postprocedural states: Secondary | ICD-10-CM

## 2020-07-23 DIAGNOSIS — I472 Ventricular tachycardia: Secondary | ICD-10-CM | POA: Diagnosis present

## 2020-07-23 DIAGNOSIS — R197 Diarrhea, unspecified: Secondary | ICD-10-CM | POA: Diagnosis present

## 2020-07-23 DIAGNOSIS — R42 Dizziness and giddiness: Secondary | ICD-10-CM

## 2020-07-23 DIAGNOSIS — K567 Ileus, unspecified: Secondary | ICD-10-CM | POA: Diagnosis not present

## 2020-07-23 DIAGNOSIS — Z951 Presence of aortocoronary bypass graft: Secondary | ICD-10-CM

## 2020-07-23 DIAGNOSIS — I080 Rheumatic disorders of both mitral and aortic valves: Secondary | ICD-10-CM | POA: Diagnosis present

## 2020-07-23 DIAGNOSIS — I4821 Permanent atrial fibrillation: Secondary | ICD-10-CM | POA: Diagnosis present

## 2020-07-23 DIAGNOSIS — R109 Unspecified abdominal pain: Secondary | ICD-10-CM

## 2020-07-23 DIAGNOSIS — R55 Syncope and collapse: Secondary | ICD-10-CM

## 2020-07-23 DIAGNOSIS — J90 Pleural effusion, not elsewhere classified: Secondary | ICD-10-CM

## 2020-07-23 DIAGNOSIS — N1831 Chronic kidney disease, stage 3a: Secondary | ICD-10-CM | POA: Diagnosis present

## 2020-07-23 DIAGNOSIS — I129 Hypertensive chronic kidney disease with stage 1 through stage 4 chronic kidney disease, or unspecified chronic kidney disease: Secondary | ICD-10-CM | POA: Diagnosis present

## 2020-07-23 DIAGNOSIS — E1122 Type 2 diabetes mellitus with diabetic chronic kidney disease: Secondary | ICD-10-CM | POA: Diagnosis present

## 2020-07-23 DIAGNOSIS — I119 Hypertensive heart disease without heart failure: Secondary | ICD-10-CM

## 2020-07-23 DIAGNOSIS — E669 Obesity, unspecified: Secondary | ICD-10-CM | POA: Diagnosis present

## 2020-07-23 DIAGNOSIS — I482 Chronic atrial fibrillation, unspecified: Secondary | ICD-10-CM | POA: Diagnosis present

## 2020-07-23 DIAGNOSIS — I130 Hypertensive heart and chronic kidney disease with heart failure and stage 1 through stage 4 chronic kidney disease, or unspecified chronic kidney disease: Secondary | ICD-10-CM | POA: Diagnosis present

## 2020-07-23 LAB — CBC
HCT: 41.2 % (ref 39.0–52.0)
Hemoglobin: 13 g/dL (ref 13.0–17.0)
MCH: 28.7 pg (ref 26.0–34.0)
MCHC: 31.6 g/dL (ref 30.0–36.0)
MCV: 90.9 fL (ref 80.0–100.0)
Platelets: 325 10*3/uL (ref 150–400)
RBC: 4.53 MIL/uL (ref 4.22–5.81)
RDW: 14.1 % (ref 11.5–15.5)
WBC: 7 10*3/uL (ref 4.0–10.5)
nRBC: 0 % (ref 0.0–0.2)

## 2020-07-23 LAB — COMPREHENSIVE METABOLIC PANEL
ALT: 36 U/L (ref 0–44)
AST: 48 U/L — ABNORMAL HIGH (ref 15–41)
Albumin: 2.9 g/dL — ABNORMAL LOW (ref 3.5–5.0)
Alkaline Phosphatase: 166 U/L — ABNORMAL HIGH (ref 38–126)
Anion gap: 10 (ref 5–15)
BUN: 11 mg/dL (ref 8–23)
CO2: 21 mmol/L — ABNORMAL LOW (ref 22–32)
Calcium: 8.6 mg/dL — ABNORMAL LOW (ref 8.9–10.3)
Chloride: 105 mmol/L (ref 98–111)
Creatinine, Ser: 1.2 mg/dL (ref 0.61–1.24)
GFR calc Af Amer: >60 mL/min (ref >60)
GFR calc non Af Amer: >60 mL/min (ref >60)
Glucose, Bld: 137 mg/dL — ABNORMAL HIGH (ref 70–99)
Potassium: 3.4 mmol/L — ABNORMAL LOW (ref 3.5–5.1)
Sodium: 136 mmol/L (ref 135–145)
Total Bilirubin: 2 mg/dL — ABNORMAL HIGH (ref 0.3–1.2)
Total Protein: 6.6 g/dL (ref 6.5–8.1)

## 2020-07-23 LAB — CBG MONITORING, ED: Glucose-Capillary: 117 mg/dL — ABNORMAL HIGH (ref 70–99)

## 2020-07-23 LAB — LIPASE, BLOOD: Lipase: 21 U/L (ref 11–51)

## 2020-07-23 MED ORDER — SODIUM CHLORIDE 0.9 % IV SOLN
1000.0000 mL | INTRAVENOUS | Status: DC
  Administered 2020-07-23: 1000 mL via INTRAVENOUS

## 2020-07-23 MED ORDER — SODIUM CHLORIDE 0.9 % IV BOLUS (SEPSIS)
1000.0000 mL | Freq: Once | INTRAVENOUS | Status: AC
  Administered 2020-07-23: 1000 mL via INTRAVENOUS

## 2020-07-23 NOTE — ED Provider Notes (Addendum)
[facility] EMERGENCY DEPARTMENT Provider Note   CSN: [id number] Arrival date & time: 07/23/20  1207     History Chief Complaint  Patient presents with  . Dizziness    [name] Rice is a 69 y.o. male.  HPI   Pt has been having issues with dizziness.  Patient was diagnosed with atrial fibrillation earlier this year.  He has been followed at [facility] cardiology.  Patient is states the end of August he was back in the ED for near syncopal episode.  Patient was noted to be bradycardic.  Patient was instructed to hold his metoprolol.  Saw Dr Joan Flores in follow-up in the office..  Stopped his metoprolol and had a heart monitor.  Pt continues to feel weak and tired.   He has an appointment in October but this am his blood pressure was low and he was feeling dizzy.  He did have diarreha this past weekend.  It went away for a couple days but started back up again the last couple of days.  He has had 3-4 loose stools today.    Notes in the electronic medical record indicate that has worn in his heart monitor and it has shown episodes of slow A. fib and pauses.  Past Medical History:  Diagnosis Date  . A-fib (HCC)   . Atrial fibrillation (HCC)   . Dysrhythmia   . Hypertension   . LBBB (left bundle branch block)     Patient Active Problem List   Diagnosis Date Noted  . A-fib (HCC)   . Long term current use of anticoagulant 01/14/2020  . Benign hypertension with CKD (chronic kidney disease) stage III 06/12/2019  . Pure hypercholesterolemia 06/12/2019  . Atrial fibrillation (HCC) 09/25/2016    Past Surgical History:  Procedure Laterality Date  . CARDIOVERSION N/A 09/28/2016   Procedure: CARDIOVERSION;  Surgeon: Yates Decamp, MD;  Location: [facility] ENDOSCOPY;  Service: Cardiovascular;  Laterality: N/A;  . NO PAST SURGERIES         No family history on file.  Social History   Tobacco Use  . Smoking status: Never Smoker  . Smokeless tobacco: Never Used  Vaping Use    . Vaping Use: Never used  Substance Use Topics  . Alcohol use: No  . Drug use: No    Home Medications Prior to Admission medications   Medication Sig Start Date End Date Taking? Authorizing Provider  amLODipine (NORVASC) 10 MG tablet TAKE 1 TABLET BY MOUTH  DAILY Patient taking differently: Take 10 mg by mouth every evening.  04/01/20   Tolia, Sunit, DO  metoprolol tartrate (LOPRESSOR) 25 MG tablet TAKE 1 TABLET BY MOUTH  TWICE DAILY 01/16/20   Toniann Fail, NP  olmesartan (BENICAR) 40 MG tablet TAKE 1 TABLET BY MOUTH  DAILY 07/02/20   Tolia, Sunit, DO  rosuvastatin (CRESTOR) 10 MG tablet TAKE 1 TABLET BY MOUTH  DAILY 07/02/20   Yates Decamp, MD  XARELTO 20 MG TABS tablet TAKE 1 TABLET BY MOUTH IN  THE EVENING AFTER DINNER 04/01/20   Tolia, Sunit, DO    Allergies    Patient has no known allergies.  Review of Systems   Review of Systems  Constitutional: Negative for fever.  Respiratory: Negative for shortness of breath.   Cardiovascular: Negative for chest pain.  Gastrointestinal: Negative for blood in stool and vomiting.  Genitourinary: Negative for dysuria.  All other systems reviewed and are negative.   Physical Exam Updated Vital Signs BP 100/65  Pulse 80   Temp 98.1 F (36.7 C) (Oral)   Resp 18   SpO2 100%   Physical Exam  ED Results / Procedures / Treatments   Labs (all labs ordered are listed, but only abnormal results are displayed) Labs Reviewed  COMPREHENSIVE METABOLIC PANEL - Abnormal; Notable for the following components:      Result Value   Potassium 3.4 (*)    CO2 21 (*)    Glucose, Bld 137 (*)    Calcium 8.6 (*)    Albumin 2.9 (*)    AST 48 (*)    Alkaline Phosphatase 166 (*)    Total Bilirubin 2.0 (*)    All other components within normal limits  CBG MONITORING, ED - Abnormal; Notable for the following components:   Glucose-Capillary 117 (*)    All other components within normal limits  C DIFFICILE QUICK SCREEN W PCR REFLEX  SARS  CORONAVIRUS 2 BY RT PCR (HOSPITAL ORDER, PERFORMED IN [facility])  LIPASE, BLOOD  CBC  URINALYSIS, ROUTINE W REFLEX MICROSCOPIC    EKG EKG Interpretation  Date/Time:  Wednesday July 23 2020 12:10:53 EDT Ventricular Rate:  65 PR Interval:    QRS Duration: 152 QT Interval:  412 QTC Calculation: 428 R Axis:   -68 Text Interpretation: Atrial fibrillation Left axis deviation Left bundle branch block Abnormal ECG No significant change since last tracing Confirmed by Linwood Dibbles [phone]) on 07/23/2020 8:01:01 PM   Radiology No results found.  Procedures Procedures (including critical care time)  Medications Ordered in ED Medications  sodium chloride 0.9 % bolus 1,000 mL (0 mLs Intravenous Stopped 07/23/20 2324)    Followed by  0.9 %  sodium chloride infusion (1,000 mLs Intravenous New Bag/Given 07/23/20 2323)    ED Course  I have reviewed the triage vital signs and the nursing notes.  Pertinent labs & imaging results that were available during my care of the patient were reviewed by me and considered in my medical decision making (see chart for details).  Clinical Course as of Jul 24 2323  Wed Jul 23, 2020  2100 Patient's initial blood pressure was normal.  Blood pressure at 1943 was 68/49.  Patient appears alert in no distress at the bedside.  Will repeat blood pressure.  Fluid boluses infusing   [JK]  2129 BP is 129/118 while patient is sitting in a chair.  Doubt that the 68/49 was accurate   [JK]  2246 Patient's blood pressure has remained stable   [JK]  2316 Blood sugar has remained stable.  Blood pressure stable.   [JK]    Clinical Course User Index [JK] Linwood Dibbles, MD   MDM Rules/Calculators/A&P                          Patient presented to the ED for evaluation of persistent episodes of dizziness.  Patient does have history of A. fib and recently wore a Holter monitor.  Spoke with Dr. Odis Hollingshead and this did show episodes of nonsustained V. tach as well  as 9-second pauses with underlying A. Fib.  Patient is not currently taking his beta-blocker.  He has been having some diuresing recently and his blood pressure was low in the ED.  Symptoms may be multifactorial.  Dr. Odis Hollingshead recommends admission the hospital he will see the patient in the morning. Also requests IV heparin in case he needs a procedure.  I will consult the medical service for admission  Final Clinical Impression(s) / ED Diagnoses Final diagnoses:  Near syncope  Diarrhea, unspecified type    Rx / DC Orders ED Discharge Orders    None       Linwood Dibbles, MD 07/23/20 2324    Linwood Dibbles, MD 07/23/20 2340

## 2020-07-23 NOTE — Telephone Encounter (Signed)
Cheryl from [employer] : Monitor result  Abnormal:   Worn 13 days 8/26-9/9   Pauses and slow Afib.  Pauses back to back equaling 19.2 seconds. Total of 1,519 seconds, with longest pause being 9.56 seconds on 8/27 @12 :36am.   Slow Afib, 25 bpm for 60 seconds on 8/26 @ 10:23pm. Afib was 100% burden.   She will post results in a few.

## 2020-07-23 NOTE — ED Notes (Addendum)
Pt called RN over, stating that he is becoming more disoriented.  Pt able to carry on lucid conversation and is ao x 4.  CB was wnl.  VS wnl.

## 2020-07-23 NOTE — Telephone Encounter (Signed)
Can you see if there are any availabilities on Dr. Odis Hollingshead schedule?

## 2020-07-23 NOTE — ED Notes (Signed)
The pt c/o diarrhea since last Friday he has seen his doctor for the same and o x4  No distress

## 2020-07-23 NOTE — ED Triage Notes (Signed)
Pt here via EMS from home for eval of intermittent dizziness x 3 weeks. Having diarrhea x 3 days, went to UC and they gave him fluid and a rx with resolution of diarrhea but dizziness recurred today. Orthostatic changes with EMS. 500 NS bolus given by EMS. Evaluated for same previously and was wearing a heart monitor but has not gotten the results from that.

## 2020-07-23 NOTE — Telephone Encounter (Signed)
Can you see if he can come in tomorrow for office visit.

## 2020-07-24 ENCOUNTER — Observation Stay (HOSPITAL_COMMUNITY): Payer: Medicare Other

## 2020-07-24 ENCOUNTER — Encounter (HOSPITAL_COMMUNITY): Payer: Self-pay | Admitting: Internal Medicine

## 2020-07-24 DIAGNOSIS — R197 Diarrhea, unspecified: Secondary | ICD-10-CM | POA: Diagnosis present

## 2020-07-24 DIAGNOSIS — R001 Bradycardia, unspecified: Secondary | ICD-10-CM | POA: Diagnosis present

## 2020-07-24 DIAGNOSIS — Z7901 Long term (current) use of anticoagulants: Secondary | ICD-10-CM | POA: Diagnosis not present

## 2020-07-24 DIAGNOSIS — I447 Left bundle-branch block, unspecified: Secondary | ICD-10-CM | POA: Diagnosis present

## 2020-07-24 DIAGNOSIS — I4821 Permanent atrial fibrillation: Secondary | ICD-10-CM | POA: Diagnosis present

## 2020-07-24 DIAGNOSIS — R42 Dizziness and giddiness: Secondary | ICD-10-CM

## 2020-07-24 DIAGNOSIS — I472 Ventricular tachycardia: Secondary | ICD-10-CM | POA: Diagnosis present

## 2020-07-24 DIAGNOSIS — E8809 Other disorders of plasma-protein metabolism, not elsewhere classified: Secondary | ICD-10-CM | POA: Diagnosis present

## 2020-07-24 DIAGNOSIS — R55 Syncope and collapse: Secondary | ICD-10-CM | POA: Diagnosis not present

## 2020-07-24 DIAGNOSIS — N179 Acute kidney failure, unspecified: Secondary | ICD-10-CM | POA: Diagnosis present

## 2020-07-24 DIAGNOSIS — Z79899 Other long term (current) drug therapy: Secondary | ICD-10-CM | POA: Diagnosis not present

## 2020-07-24 DIAGNOSIS — I361 Nonrheumatic tricuspid (valve) insufficiency: Secondary | ICD-10-CM | POA: Diagnosis not present

## 2020-07-24 DIAGNOSIS — N1831 Chronic kidney disease, stage 3a: Secondary | ICD-10-CM | POA: Diagnosis present

## 2020-07-24 DIAGNOSIS — N183 Chronic kidney disease, stage 3 unspecified: Secondary | ICD-10-CM

## 2020-07-24 DIAGNOSIS — I4819 Other persistent atrial fibrillation: Secondary | ICD-10-CM | POA: Diagnosis not present

## 2020-07-24 DIAGNOSIS — E876 Hypokalemia: Secondary | ICD-10-CM | POA: Diagnosis present

## 2020-07-24 DIAGNOSIS — E7800 Pure hypercholesterolemia, unspecified: Secondary | ICD-10-CM | POA: Diagnosis present

## 2020-07-24 DIAGNOSIS — R109 Unspecified abdominal pain: Secondary | ICD-10-CM

## 2020-07-24 DIAGNOSIS — I129 Hypertensive chronic kidney disease with stage 1 through stage 4 chronic kidney disease, or unspecified chronic kidney disease: Secondary | ICD-10-CM

## 2020-07-24 DIAGNOSIS — I340 Nonrheumatic mitral (valve) insufficiency: Secondary | ICD-10-CM

## 2020-07-24 DIAGNOSIS — Z20822 Contact with and (suspected) exposure to covid-19: Secondary | ICD-10-CM | POA: Diagnosis present

## 2020-07-24 DIAGNOSIS — J9811 Atelectasis: Secondary | ICD-10-CM | POA: Diagnosis not present

## 2020-07-24 DIAGNOSIS — Z8249 Family history of ischemic heart disease and other diseases of the circulatory system: Secondary | ICD-10-CM | POA: Diagnosis not present

## 2020-07-24 DIAGNOSIS — I130 Hypertensive heart and chronic kidney disease with heart failure and stage 1 through stage 4 chronic kidney disease, or unspecified chronic kidney disease: Secondary | ICD-10-CM | POA: Diagnosis present

## 2020-07-24 DIAGNOSIS — Z6835 Body mass index (BMI) 35.0-35.9, adult: Secondary | ICD-10-CM | POA: Diagnosis not present

## 2020-07-24 DIAGNOSIS — I4891 Unspecified atrial fibrillation: Secondary | ICD-10-CM | POA: Diagnosis not present

## 2020-07-24 DIAGNOSIS — I2510 Atherosclerotic heart disease of native coronary artery without angina pectoris: Secondary | ICD-10-CM | POA: Diagnosis present

## 2020-07-24 DIAGNOSIS — E785 Hyperlipidemia, unspecified: Secondary | ICD-10-CM | POA: Diagnosis present

## 2020-07-24 DIAGNOSIS — K567 Ileus, unspecified: Secondary | ICD-10-CM | POA: Diagnosis not present

## 2020-07-24 DIAGNOSIS — I5022 Chronic systolic (congestive) heart failure: Secondary | ICD-10-CM | POA: Diagnosis present

## 2020-07-24 DIAGNOSIS — D62 Acute posthemorrhagic anemia: Secondary | ICD-10-CM | POA: Diagnosis not present

## 2020-07-24 DIAGNOSIS — E669 Obesity, unspecified: Secondary | ICD-10-CM | POA: Diagnosis present

## 2020-07-24 DIAGNOSIS — E860 Dehydration: Secondary | ICD-10-CM | POA: Diagnosis present

## 2020-07-24 DIAGNOSIS — Z01810 Encounter for preprocedural cardiovascular examination: Secondary | ICD-10-CM | POA: Diagnosis not present

## 2020-07-24 LAB — BASIC METABOLIC PANEL
Anion gap: 11 (ref 5–15)
BUN: 17 mg/dL (ref 8–23)
CO2: 18 mmol/L — ABNORMAL LOW (ref 22–32)
Calcium: 8.3 mg/dL — ABNORMAL LOW (ref 8.9–10.3)
Chloride: 108 mmol/L (ref 98–111)
Creatinine, Ser: 1.74 mg/dL — ABNORMAL HIGH (ref 0.61–1.24)
GFR calc Af Amer: 46 mL/min — ABNORMAL LOW (ref >60)
GFR calc non Af Amer: 40 mL/min — ABNORMAL LOW (ref >60)
Glucose, Bld: 104 mg/dL — ABNORMAL HIGH (ref 70–99)
Potassium: 3.5 mmol/L (ref 3.5–5.1)
Sodium: 137 mmol/L (ref 135–145)

## 2020-07-24 LAB — URINALYSIS, ROUTINE W REFLEX MICROSCOPIC
Glucose, UA: NEGATIVE mg/dL
Hgb urine dipstick: NEGATIVE
Ketones, ur: NEGATIVE mg/dL
Leukocytes,Ua: NEGATIVE
Nitrite: NEGATIVE
Protein, ur: 30 mg/dL — AB
Specific Gravity, Urine: 1.023 (ref 1.005–1.030)
pH: 5 (ref 5.0–8.0)

## 2020-07-24 LAB — CBC WITH DIFFERENTIAL/PLATELET
Abs Immature Granulocytes: 0.03 10*3/uL (ref 0.00–0.07)
Basophils Absolute: 0 10*3/uL (ref 0.0–0.1)
Basophils Relative: 0 %
Eosinophils Absolute: 0.3 10*3/uL (ref 0.0–0.5)
Eosinophils Relative: 4 %
HCT: 42.7 % (ref 39.0–52.0)
Hemoglobin: 12.8 g/dL — ABNORMAL LOW (ref 13.0–17.0)
Immature Granulocytes: 0 %
Lymphocytes Relative: 20 %
Lymphs Abs: 1.6 10*3/uL (ref 0.7–4.0)
MCH: 28.3 pg (ref 26.0–34.0)
MCHC: 30 g/dL (ref 30.0–36.0)
MCV: 94.5 fL (ref 80.0–100.0)
Monocytes Absolute: 0.7 10*3/uL (ref 0.1–1.0)
Monocytes Relative: 9 %
Neutro Abs: 5.5 10*3/uL (ref 1.7–7.7)
Neutrophils Relative %: 67 %
Platelets: 295 10*3/uL (ref 150–400)
RBC: 4.52 MIL/uL (ref 4.22–5.81)
RDW: 14.5 % (ref 11.5–15.5)
WBC: 8.2 10*3/uL (ref 4.0–10.5)
nRBC: 0 % (ref 0.0–0.2)

## 2020-07-24 LAB — GASTROINTESTINAL PANEL BY PCR, STOOL (REPLACES STOOL CULTURE)

## 2020-07-24 LAB — CBC
HCT: 38.8 % — ABNORMAL LOW (ref 39.0–52.0)
Hemoglobin: 12 g/dL — ABNORMAL LOW (ref 13.0–17.0)
MCH: 28 pg (ref 26.0–34.0)
MCHC: 30.9 g/dL (ref 30.0–36.0)
MCV: 90.4 fL (ref 80.0–100.0)
Platelets: 303 10*3/uL (ref 150–400)
RBC: 4.29 MIL/uL (ref 4.22–5.81)
RDW: 14.6 % (ref 11.5–15.5)
WBC: 7.3 10*3/uL (ref 4.0–10.5)
nRBC: 0 % (ref 0.0–0.2)

## 2020-07-24 LAB — ECHOCARDIOGRAM COMPLETE
AR max vel: 2.15 cm2
AV Area VTI: 2.27 cm2
AV Area mean vel: 2.37 cm2
AV Mean grad: 8 mmHg
AV Peak grad: 13.5 mmHg
Ao pk vel: 1.84 m/s
Area-P 1/2: 2.99 cm2
Height: 73 in
S' Lateral: 3.08 cm
Weight: 4000 oz

## 2020-07-24 LAB — COMPREHENSIVE METABOLIC PANEL
ALT: 32 U/L (ref 0–44)
AST: 34 U/L (ref 15–41)
Albumin: 2.7 g/dL — ABNORMAL LOW (ref 3.5–5.0)
Alkaline Phosphatase: 163 U/L — ABNORMAL HIGH (ref 38–126)
Anion gap: 12 (ref 5–15)
BUN: 18 mg/dL (ref 8–23)
CO2: 17 mmol/L — ABNORMAL LOW (ref 22–32)
Calcium: 8.4 mg/dL — ABNORMAL LOW (ref 8.9–10.3)
Chloride: 109 mmol/L (ref 98–111)
Creatinine, Ser: 1.45 mg/dL — ABNORMAL HIGH (ref 0.61–1.24)
GFR calc Af Amer: 57 mL/min — ABNORMAL LOW (ref >60)
GFR calc non Af Amer: 49 mL/min — ABNORMAL LOW (ref >60)
Glucose, Bld: 107 mg/dL — ABNORMAL HIGH (ref 70–99)
Potassium: 3.5 mmol/L (ref 3.5–5.1)
Sodium: 138 mmol/L (ref 135–145)
Total Bilirubin: 1.9 mg/dL — ABNORMAL HIGH (ref 0.3–1.2)
Total Protein: 6 g/dL — ABNORMAL LOW (ref 6.5–8.1)

## 2020-07-24 LAB — MAGNESIUM
Magnesium: 1.6 mg/dL — ABNORMAL LOW (ref 1.7–2.4)
Magnesium: 1.7 mg/dL (ref 1.7–2.4)

## 2020-07-24 LAB — PREALBUMIN: Prealbumin: 14 mg/dL — ABNORMAL LOW (ref 18–38)

## 2020-07-24 LAB — APTT: aPTT: 83 seconds — ABNORMAL HIGH (ref 24–36)

## 2020-07-24 LAB — C DIFFICILE QUICK SCREEN W PCR REFLEX
C Diff antigen: NEGATIVE
C Diff interpretation: NOT DETECTED
C Diff toxin: NEGATIVE

## 2020-07-24 LAB — LACTIC ACID, PLASMA: Lactic Acid, Venous: 1.7 mmol/L (ref 0.5–1.9)

## 2020-07-24 LAB — PHOSPHORUS
Phosphorus: 2.8 mg/dL (ref 2.5–4.6)
Phosphorus: 2.8 mg/dL (ref 2.5–4.6)

## 2020-07-24 LAB — SARS CORONAVIRUS 2 BY RT PCR (HOSPITAL ORDER, PERFORMED IN ~~LOC~~ HOSPITAL LAB): SARS Coronavirus 2: NEGATIVE

## 2020-07-24 LAB — CK: Total CK: 39 U/L — ABNORMAL LOW (ref 49–397)

## 2020-07-24 LAB — TROPONIN I (HIGH SENSITIVITY): Troponin I (High Sensitivity): 45 ng/L — ABNORMAL HIGH (ref <18)

## 2020-07-24 LAB — TSH: TSH: 0.941 u[IU]/mL (ref 0.350–4.500)

## 2020-07-24 LAB — HIV ANTIBODY (ROUTINE TESTING W REFLEX): HIV Screen 4th Generation wRfx: NONREACTIVE

## 2020-07-24 LAB — HEPARIN LEVEL (UNFRACTIONATED): Heparin Unfractionated: 1.08 IU/mL — ABNORMAL HIGH (ref 0.30–0.70)

## 2020-07-24 LAB — BRAIN NATRIURETIC PEPTIDE: B Natriuretic Peptide: 206.5 pg/mL — ABNORMAL HIGH (ref 0.0–100.0)

## 2020-07-24 MED ORDER — ONDANSETRON HCL 4 MG/2ML IJ SOLN: 4.0000 mg | Freq: Four times a day (QID) | INTRAMUSCULAR | Status: DC | PRN

## 2020-07-24 MED ORDER — LOPERAMIDE HCL 2 MG PO CAPS
4.0000 mg | ORAL_CAPSULE | ORAL | Status: DC | PRN
  Filled 2020-07-24: qty 2

## 2020-07-24 MED ORDER — MAGNESIUM OXIDE 400 (241.3 MG) MG PO TABS
400.0000 mg | ORAL_TABLET | Freq: Three times a day (TID) | ORAL | Status: AC
  Administered 2020-07-24 (×3): 400 mg via ORAL
  Filled 2020-07-24 (×3): qty 1

## 2020-07-24 MED ORDER — PSYLLIUM 95 % PO PACK
1.0000 | PACK | Freq: Every day | ORAL | Status: DC
  Administered 2020-07-25 – 2020-08-02 (×6): 1 via ORAL
  Filled 2020-07-24 (×13): qty 1

## 2020-07-24 MED ORDER — HEPARIN (PORCINE) 25000 UT/250ML-% IV SOLN
1500.0000 [IU]/h | INTRAVENOUS | Status: DC
  Administered 2020-07-24 (×2): 1500 [IU]/h via INTRAVENOUS
  Filled 2020-07-24 (×2): qty 250

## 2020-07-24 MED ORDER — SODIUM CHLORIDE 0.9 % WEIGHT BASED INFUSION
1.0000 mL/kg/h | INTRAVENOUS | Status: DC
  Administered 2020-07-24 – 2020-07-25 (×2): 1 mL/kg/h via INTRAVENOUS

## 2020-07-24 MED ORDER — POTASSIUM CHLORIDE CRYS ER 20 MEQ PO TBCR
40.0000 meq | EXTENDED_RELEASE_TABLET | Freq: Two times a day (BID) | ORAL | Status: AC
  Administered 2020-07-24 (×2): 40 meq via ORAL
  Filled 2020-07-24 (×2): qty 2

## 2020-07-24 MED ORDER — SODIUM CHLORIDE 0.9 % IV SOLN: 250.0000 mL | INTRAVENOUS | Status: DC | PRN

## 2020-07-24 MED ORDER — SODIUM CHLORIDE 0.9 % IV SOLN: INTRAVENOUS | Status: AC

## 2020-07-24 MED ORDER — SODIUM CHLORIDE 0.9% FLUSH
3.0000 mL | Freq: Two times a day (BID) | INTRAVENOUS | Status: DC
  Administered 2020-07-25 – 2020-07-28 (×2): 3 mL via INTRAVENOUS

## 2020-07-24 MED ORDER — SODIUM CHLORIDE 0.9% FLUSH
3.0000 mL | Freq: Two times a day (BID) | INTRAVENOUS | Status: DC
  Administered 2020-07-24 – 2020-07-25 (×3): 3 mL via INTRAVENOUS

## 2020-07-24 MED ORDER — POTASSIUM CHLORIDE 10 MEQ/100ML IV SOLN
10.0000 meq | INTRAVENOUS | Status: AC
  Administered 2020-07-24: 10 meq via INTRAVENOUS
  Filled 2020-07-24 (×2): qty 100

## 2020-07-24 MED ORDER — ONDANSETRON HCL 4 MG PO TABS: 4.0000 mg | ORAL_TABLET | Freq: Four times a day (QID) | ORAL | Status: DC | PRN

## 2020-07-24 MED ORDER — HYDROCODONE-ACETAMINOPHEN 5-325 MG PO TABS: 1.0000 | ORAL_TABLET | ORAL | Status: DC | PRN

## 2020-07-24 MED ORDER — ASPIRIN 81 MG PO CHEW
81.0000 mg | CHEWABLE_TABLET | ORAL | Status: AC
  Administered 2020-07-25: 81 mg via ORAL
  Filled 2020-07-24: qty 1

## 2020-07-24 MED ORDER — ROSUVASTATIN CALCIUM 5 MG PO TABS
10.0000 mg | ORAL_TABLET | Freq: Every day | ORAL | Status: DC
  Administered 2020-07-24 – 2020-07-26 (×3): 10 mg via ORAL
  Filled 2020-07-24 (×3): qty 2

## 2020-07-24 MED ORDER — ACETAMINOPHEN 325 MG PO TABS: 650.0000 mg | ORAL_TABLET | Freq: Four times a day (QID) | ORAL | Status: DC | PRN

## 2020-07-24 MED ORDER — SODIUM CHLORIDE 0.9% FLUSH: 3.0000 mL | INTRAVENOUS | Status: DC | PRN

## 2020-07-24 MED ORDER — ACETAMINOPHEN 650 MG RE SUPP: 650.0000 mg | Freq: Four times a day (QID) | RECTAL | Status: DC | PRN

## 2020-07-24 NOTE — H&P (Signed)
[name] Rice MWN:[id number] DOB: 1951-04-22 DOA: 07/23/2020     PCP: [name], PA-C   Outpatient Specialists:   CARDS:   Dr. Odis Hollingshead   Patient arrived to ER on 07/23/20 at 1207 Referred by Attending Linwood Dibbles, MD   Patient coming from: home Lives alone,      Chief Complaint:   Chief Complaint  Patient presents with  . Dizziness    HPI: [name] Rice is a 69 y.o. male with medical history significant of a.fib, bradycardia, HTN, HLD, OBesity, LBBB, chronic systolic CHF    Presented with diarrhea and lightheadedness  Patient has had intermittent lightheadedness and last month was hospitalized He was found to have new left bundle branch block echogram showed mildly reduced left ventricular systolic function as well as regional wall motion abnormalities nuclear stress test showed perfusion defect which felt to be could be secondary to left bundle branch block but could be also secondary to CAD patient wanted to stay with medical management.  He has been seen in the past by Dr. Jacinto Halim due to bradycardia and was told to stop his metoprolol Given that he continued to have some lightheadedness Dr. Odis Hollingshead initiated Holter monitor Patient himself continued to hold off on Lopressor but still took Norvasc and Benicar as well as Crestor and Xarelto. Starting last week 4 days ago patient started to have diarrhea that lasted for 2 days Patient went to urgent care and had to get IV fluids as well as some Imodium that slowed her down for a bit then resumed 2 days ago its been significant he feels like he is having multiple watery bowel movements per day.  No blood in bowel movements no nausea no vomiting he has had some abdominal cramping no fevers or chills.  He has well water but mainly been trying to drink bottled water.  He eats out over time but not sure if he has been exposed to any food poisoning. He has been having persistent orthostatic changes and dizziness EMS was called today  and given 500 normal saline bolus.  His Holter monitor showed slow atrial fibrillation and pauses up to 9.56 seconds which occurred on 27 August ER provider spoke to cardiology who reported evidence of nonsustained V. tach on Holter monitor and positive A. fib they wanted patient to be father admitted and evaluated  Infectious risk factors:  Reports Diarrhea/abdominal pain,   severe fatigue   Has  been vaccinated against COVID    Initial COVID TEST   in house  PCR testing  Pending  Lab Results  Component Value Date   SARSCOV2NAA NEGATIVE 07/02/2020    Regarding pertinent Chronic problems:     Hyperlipidemia -  on statins crestor Lipid Panel     Component Value Date/Time   CHOL 143 07/10/2019 0906   TRIG 97 07/10/2019 0906   HDL 51 07/10/2019 0906   CHOLHDL 2.8 07/10/2019 0906   LDLCALC 74 07/10/2019 0906   LABVLDL 18 07/10/2019 0906     HTN on Norvasc panic   chronic CHF  Systolic  - last echo August 2021  obesity-   BMI Readings from Last 1 Encounters:  07/03/20 35.23 kg/m    A. Fib -  - CHA2DS2 vas score    3    current  on anticoagulation with Xarelto ,  Does not tolerate beta-blocker or becomes hypotensive and bradycardic      CKD stage III - baseline Cr 1.36 CrCl cannot be calculated (Unknown  ideal weight.).  Lab Results  Component Value Date   CREATININE 1.20 07/23/2020   CREATININE 1.36 (H) 07/02/2020   CREATININE 1.38 (H) 01/07/2020    While in ER:  Obtain gastric panel noted to be intermittently hypertensive in the emergency department down to 60s.  Now up to 120s blood pressure. He was given fluid bolus and after the blood pressure was stable ER Provider Called:  Cardiology    Dr.Tolia They Recommend admit to medicine hold Xarelto changed to IV heparin Will see in AM   Hospitalist was called for admission for bradycardia, hypotension, diarrhea  The following Work up has been ordered so far:  Orders Placed This Encounter  Procedures  . C  Difficile Quick Screen w PCR reflex  . SARS Coronavirus 2 by RT PCR (hospital order, performed in [facility] hospital lab) Nasopharyngeal Nasopharyngeal Swab  . Lipase, blood  . Comprehensive metabolic panel  . CBC  . Urinalysis, Routine w reflex microscopic  . Diet NPO time specified  . Consult to hospitalist  ALL PATIENTS BEING ADMITTED/HAVING PROCEDURES NEED COVID-19 SCREENING  . Enteric precautions (UV disinfection)  . CBG monitoring, ED  . EKG 12-Lead    Following Medications were ordered in ER: Medications  sodium chloride 0.9 % bolus 1,000 mL (0 mLs Intravenous Stopped 07/23/20 2324)    Followed by  0.9 %  sodium chloride infusion (1,000 mLs Intravenous New Bag/Given 07/23/20 2323)        Consult Orders  (From admission, onward)         Start     Ordered   07/23/20 2325  Consult to hospitalist  ALL PATIENTS BEING ADMITTED/HAVING PROCEDURES NEED COVID-19 SCREENING Paged by Nehemiah Settle  Once       Comments: ALL PATIENTS BEING ADMITTED/HAVING PROCEDURES NEED COVID-19 SCREENING  Provider:  (Not yet assigned)  Question Answer Comment  Place call to: [facility]   Reason for Consult Admit      07/23/20 2324           Significant initial  Findings: Abnormal Labs Reviewed  COMPREHENSIVE METABOLIC PANEL - Abnormal; Notable for the following components:      Result Value   Potassium 3.4 (*)    CO2 21 (*)    Glucose, Bld 137 (*)    Calcium 8.6 (*)    Albumin 2.9 (*)    AST 48 (*)    Alkaline Phosphatase 166 (*)    Total Bilirubin 2.0 (*)    All other components within normal limits  CBG MONITORING, ED - Abnormal; Notable for the following components:   Glucose-Capillary 117 (*)    All other components within normal limits    Otherwise labs showing:    Recent Labs  Lab 07/23/20 1221  NA 136  K 3.4*  CO2 21*  GLUCOSE 137*  BUN 11  CREATININE 1.20  CALCIUM 8.6*    Cr   stable,    Lab Results  Component Value Date   CREATININE 1.20 07/23/2020    CREATININE 1.36 (H) 07/02/2020   CREATININE 1.38 (H) 01/07/2020    Recent Labs  Lab 07/23/20 1221  AST 48*  ALT 36  ALKPHOS 166*  BILITOT 2.0*  PROT 6.6  ALBUMIN 2.9*   Lab Results  Component Value Date   CALCIUM 8.6 (L) 07/23/2020        WBC      Component Value Date/Time   WBC 7.0 07/23/2020 1221   LYMPHSABS 1.2 07/02/2020 1604   MONOABS 0.8  07/02/2020 1604   EOSABS 0.3 07/02/2020 1604   BASOSABS 0.0 07/02/2020 1604     Plt: Lab Results  Component Value Date   PLT 325 07/23/2020      HG/HCT stable     Component Value Date/Time   HGB 13.0 07/23/2020 1221   HGB 15.9 12/13/2019 0936   HCT 41.2 07/23/2020 1221   HCT 47.8 12/13/2019 0936   MCV 90.9 07/23/2020 1221   MCV 93 12/13/2019 0936    Recent Labs  Lab 07/23/20 1221  LIPASE 21    Troponin  ordered     ECG: Ordered Personally reviewed by me showing: HR : 65  Rhythm:  A.fib , LBBB,   no evidence of ischemic changes QTC 428      CBG (last 3)  Recent Labs    07/23/20 1659  GLUCAP 117*       UA  not ordered      kub -Ordered    ED Triage Vitals  Enc Vitals Group     BP 07/23/20 1216 (!) 125/109     Pulse Rate 07/23/20 1216 (!) 59     Resp 07/23/20 1216 16     Temp 07/23/20 1216 98.1 F (36.7 C)     Temp Source 07/23/20 1216 Oral     SpO2 07/23/20 1216 100 %     Weight --      Height --      Head Circumference --      Peak Flow --      Pain Score 07/23/20 1211 0     Pain Loc --      Pain Edu? --      Excl. in GC? --   TMAX(24)@       Latest  Blood pressure 100/65, pulse 80, temperature 98.1 F (36.7 C), temperature source Oral, resp. rate 18, SpO2 100 %.      Review of Systems:    Pertinent positives include:   fatigue,  abdominal pain, diarrhea  Constitutional:  No weight loss, night sweats, Fevers, chills, weight loss  HEENT:  No headaches, Difficulty swallowing,Tooth/dental problems,Sore throat,  No sneezing, itching, ear ache, nasal congestion, post nasal drip,   Cardio-vascular:  No chest pain, Orthopnea, PND, anasarca, dizziness, palpitations.no Bilateral lower extremity swelling  GI:  No heartburn, indigestion, nausea, vomiting,, change in bowel habits, loss of appetite, melena, blood in stool, hematemesis Resp:  no shortness of breath at rest. No dyspnea on exertion, No excess mucus, no productive cough, No non-productive cough, No coughing up of blood.No change in color of mucus.No wheezing. Skin:  no rash or lesions. No jaundice GU:  no dysuria, change in color of urine, no urgency or frequency. No straining to urinate.  No flank pain.  Musculoskeletal:  No joint pain or no joint swelling. No decreased range of motion. No back pain.  Psych:  No change in mood or affect. No depression or anxiety. No memory loss.  Neuro: no localizing neurological complaints, no tingling, no weakness, no double vision, no gait abnormality, no slurred speech, no confusion  All systems reviewed and apart from HOPI all are negative  Past Medical History:   Past Medical History:  Diagnosis Date  . A-fib (HCC)   . Atrial fibrillation (HCC)   . Dysrhythmia   . Hypertension   . LBBB (left bundle branch block)      Past Surgical History:  Procedure Laterality Date  . CARDIOVERSION N/A 09/28/2016   Procedure: CARDIOVERSION;  Surgeon: Vonna Kotyk  Jacinto HalimGanji, MD;  Location: [facility] ENDOSCOPY;  Service: Cardiovascular;  Laterality: N/A;  . NO PAST SURGERIES      Social History:  Ambulatory   independently       reports that he has never smoked. He has never used smokeless tobacco. He reports that he does not drink alcohol and does not use drugs.   Family History:   Family History  Problem Relation Age of Onset  . Hypertension Other     Allergies: No Known Allergies   Prior to Admission medications   Medication Sig Start Date End Date Taking? Authorizing Provider  amLODipine (NORVASC) 10 MG tablet TAKE 1 TABLET BY MOUTH  DAILY Patient taking differently: Take  10 mg by mouth every evening.  04/01/20   Tolia, Sunit, DO  metoprolol tartrate (LOPRESSOR) 25 MG tablet TAKE 1 TABLET BY MOUTH  TWICE DAILY 01/16/20   Toniann FailKelley, Ashton Haynes, NP  olmesartan (BENICAR) 40 MG tablet TAKE 1 TABLET BY MOUTH  DAILY 07/02/20   Tolia, Sunit, DO  rosuvastatin (CRESTOR) 10 MG tablet TAKE 1 TABLET BY MOUTH  DAILY 07/02/20   Yates DecampGanji, Jay, MD  XARELTO 20 MG TABS tablet TAKE 1 TABLET BY MOUTH IN  THE EVENING AFTER DINNER 04/01/20   Tessa Lernerolia, Sunit, DO   Physical Exam: Vitals with BMI 07/23/2020 07/23/2020 07/23/2020  Height - - -  Weight - - -  BMI - - -  Systolic 100 117 [id number]  Diastolic 65 61 66  Pulse 80 86 71     1. General:  in No Acute distress   Chronically ill -appearing 2. Psychological: Alert and   Oriented 3. Head/ENT:    Dry Mucous Membranes                          Head Non traumatic, neck supple                           Poor Dentition 4. SKIN:  decreased Skin turgor,  Skin clean Dry and intact no rash 5. Heart: Regular rate and rhythm no  Murmur, no Rub or gallop 6. Lungs:  no wheezes or crackles   7. Abdomen: Soft,  non-tender, Non distended   Obese  8. Lower extremities: no clubbing, cyanosis, no  edema 9. Neurologically Grossly intact, moving all 4 extremities equally  10. MSK: Normal range of motion   All other LABS:     Recent Labs  Lab 07/23/20 1221  WBC 7.0  HGB 13.0  HCT 41.2  MCV 90.9  PLT 325     Recent Labs  Lab 07/23/20 1221  NA 136  K 3.4*  CL 105  CO2 21*  GLUCOSE 137*  BUN 11  CREATININE 1.20  CALCIUM 8.6*     Recent Labs  Lab 07/23/20 1221  AST 48*  ALT 36  ALKPHOS 166*  BILITOT 2.0*  PROT 6.6  ALBUMIN 2.9*       Cultures: No results found for: SDES, SPECREQUEST, CULT, REPTSTATUS   Radiological Exams on Admission: No results found.  Chart has been reviewed    Assessment/Plan   69 y.o. male with medical history significant of a.fib, bradycardia, HTN, HLD, OBesity, LBBB, chronic systolic CHF  Admitted  for dehydration, presyncope, dairrhea  Present on Admission: Presyncope.  Admit for observation cycle cardiac enzymes monitor on telemetry appreciate cardiology input may need to repeat echo as prior echogram per cardiology showed diminished EF  .  Atrial fibrillation (HCC) -chronic as per cardiology changed to heparin in case he needs a procedure.  Continue to hold beta-blocker.  Currently stable  . Benign hypertension with CKD (chronic kidney disease) stage III -hold home medications given hypotension  CKD stable avoid nephrotoxic medications  . Pure hypercholesterolemia chronic stable continue home meds   . Hypokalemia -replace check magnesium level check CK check fasting  Hypoalbuminemia we will check prealbumin and order nutritional consult  . Diarrhea -unclear etiology will order gastric panel check for C. difficile reports history of antibiotic use but that was removed around 7 weeks ago   Other plan as per orders.  DVT prophylaxis:  heparin      Code Status:    Code Status: Not on file FULL CODE   as per patient   I had personally discussed CODE STATUS with patient     Family Communication:   Family not at  Bedside    Disposition Plan:   To home once workup is complete and patient is stable   Following barriers for discharge:                            Electrolytes corrected                                                           Will need to be able to tolerate PO                                                    Will need consultants to evaluate patient prior to discharge                                          Nutrition    consulted                  Consults called: cardiology is aware   Admission status:  ED Disposition    ED Disposition Condition Comment   Admit  Hospital Area: [facility] [[id number]]  Level of Care: Telemetry Cardiac [103]  I expect the patient will be discharged within 24 hours: No (not a candidate for 5C-Observation  unit)  Covid Evaluation: Asymptomatic Screening Protocol (No Symptoms)  Diagnosis: Postural dizziness with presyncope [[id number]]  Admitting Physician: Therisa Doyne [[id number]]  Attending Physician: Therisa Doyne [[id number]]        Obs    Level of care    tele  For  24H    Lab Results  Component Value Date   SARSCOV2NAA NEGATIVE 07/02/2020     Precautions: admitted as asymptomatic screening protocol   PPE: Used by the provider:   P100  eye Goggles,  Gloves  gown   Gael Delude 07/24/2020, 1:25 AM    [facility]     after 2 AM please page floor coverage PA If 7AM-7PM, please contact the day team taking care of the patient using [email or web address]   Patient was evaluated in the context of the global COVID-19 pandemic, which necessitated  consideration that the patient might be at risk for infection with the SARS-CoV-2 virus that causes COVID-19. Institutional protocols and algorithms that pertain to the evaluation of patients at risk for COVID-19 are in a state of rapid change based on information released by regulatory bodies including the CDC and federal and state organizations. These policies and algorithms were followed during the patient's care.

## 2020-07-24 NOTE — ED Notes (Signed)
Attempted report x1. 

## 2020-07-24 NOTE — Progress Notes (Addendum)
[facility] PROGRESS NOTE    Progress Note  [name] Rice  QDU:[id number] DOB: [birth year] DOA: 07/23/2020 PCP: [name] Campbell., PA-C     Brief Narrative:   [name] Rice is an 69 y.o. male past medical history significant for A. 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  Elevated by his cardiologist and his metoprolol and antihypertensive medications were stopped, she only stopped her metoprolol but he continued to take the Norvasc and the Benicar, after that she started having diarrhea that started 4 days prior to admission.  Assessment/Plan:   Lightheadedness/presyncope: Check orthostatics.  Heart rate and blood pressure are unremarkable. Cardiac biomarkers are mildly elevated at 45. Going back to the medical records he is worn his heart monitor lites cardiology to see if we can get report as this may be contributing to his lightheadedness, he has not taking his metoprolol in over 3 weeks. Cardiology has been consulted.  Chronic atrial fibrillation: Xarelto has been held he was started on IV heparin continue to hold beta-blockers. Heart rate has been stable.  Essential hypertension with chronic kidney disease stage III: Holding all antihypertensive medication.  Acute on chronic kidney disease stage III: On admission her creatinine was 1.2, which is her baseline this morning is 1.7 we will repeat stat started on IV fluids and recheck tomorrow morning. Concerned about prerenal azotemia.  Hypercholesterolemia: Continue statins.  Hypokalemia: Replete orally.  Diarrhea: Of unclear etiology C. difficile PCR is negative..  Started on Metamucil and Imodium.    DVT prophylaxis: heparin Family Communication:none Status is: Observation  The patient remains OBS appropriate and will d/c before 2 midnights.  Dispo: The patient is from:  Home              Anticipated d/c is to: Home              Anticipated d/c date is: 2 days              Patient currently is not medically stable to d/c.  Code Status:     IV Access:    Peripheral IV   Procedures and diagnostic studies:   DG Abd 1 View  Result Date: 07/24/2020 CLINICAL DATA:  Diarrhea for 5 days EXAM: ABDOMEN - 1 VIEW COMPARISON:  Nine hundred eighteen FINDINGS: 2 supine frontal views of the abdomen and pelvis are obtained. Bowel gas pattern is unremarkable without obstruction or ileus. No masses or abnormal calcifications. The lung bases are clear. IMPRESSION: 1. Unremarkable bowel gas pattern. Electronically Signed   By: Sharlet Salina M.D.   On: 07/24/2020 01:25     Medical Consultants:    None.  Anti-Infectives:   none  Subjective:    [name] Rice he relates that while he is sitting he feels fine.  Objective:    Vitals:   07/24/20 0230 07/24/20 0315 07/24/20 0400 07/24/20 0500  BP: (!) 124/56  116/69 135/74  Pulse: 63 77 84 78  Resp: 17 13 19 15   Temp:      TempSrc:      SpO2: 100% 100% 100% 100%  Weight:      Height:       SpO2: 100 %  No intake or output data in the 24 hours ending 07/24/20 0657 Filed Weights   07/24/20 0100  Weight: 113.4 kg    Exam: General exam: In no  acute distress. Respiratory system: Good air movement and clear to auscultation. Cardiovascular system: S1 & S2 heard, RRR. No JVD. Gastrointestinal system: Abdomen is nondistended, soft and nontender.  Extremities: No pedal edema. Skin: No rashes, lesions or ulcer   Data Reviewed:    Labs: Basic Metabolic Panel: Recent Labs  Lab 07/23/20 1221 07/24/20 0210  NA 136 137  K 3.4* 3.5  CL 105 108  CO2 21* 18*  GLUCOSE 137* 104*  BUN 11 17  CREATININE 1.20 1.74*  CALCIUM 8.6* 8.3*  MG  --  1.6*  PHOS  --  2.8   GFR Estimated Creatinine Clearance: 54.4 mL/min (A) (by C-G formula based on SCr of 1.74 mg/dL (H)). Liver Function Tests: Recent  Labs  Lab 07/23/20 1221  AST 48*  ALT 36  ALKPHOS 166*  BILITOT 2.0*  PROT 6.6  ALBUMIN 2.9*   Recent Labs  Lab 07/23/20 1221  LIPASE 21   No results for input(s): AMMONIA in the last 168 hours. Coagulation profile No results for input(s): INR, PROTIME in the last 168 hours. COVID-19 Labs  No results for input(s): DDIMER, FERRITIN, LDH, CRP in the last 72 hours.  Lab Results  Component Value Date   SARSCOV2NAA NEGATIVE 07/23/2020   SARSCOV2NAA NEGATIVE 07/02/2020    CBC: Recent Labs  Lab 07/23/20 1221  WBC 7.0  HGB 13.0  HCT 41.2  MCV 90.9  PLT 325   Cardiac Enzymes: Recent Labs  Lab 07/24/20 0210  CKTOTAL 39*   BNP (last 3 results) No results for input(s): PROBNP in the last 8760 hours. CBG: Recent Labs  Lab 07/23/20 1659  GLUCAP 117*   D-Dimer: No results for input(s): DDIMER in the last 72 hours. Hgb A1c: No results for input(s): HGBA1C in the last 72 hours. Lipid Profile: No results for input(s): CHOL, HDL, LDLCALC, TRIG, CHOLHDL, LDLDIRECT in the last 72 hours. Thyroid function studies: No results for input(s): TSH, T4TOTAL, T3FREE, THYROIDAB in the last 72 hours.  Invalid input(s): FREET3 Anemia work up: No results for input(s): VITAMINB12, FOLATE, FERRITIN, TIBC, IRON, RETICCTPCT in the last 72 hours. Sepsis Labs: Recent Labs  Lab 07/23/20 1221 07/24/20 0210  WBC 7.0  --   LATICACIDVEN  --  1.7   Microbiology Recent Results (from the past 240 hour(s))  SARS Coronavirus 2 by RT PCR (hospital order, performed in [facility] hospital lab) Nasopharyngeal Nasopharyngeal Swab     Status: None   Collection Time: 07/23/20 11:46 PM   Specimen: Nasopharyngeal Swab  Result Value Ref Range Status   SARS Coronavirus 2 NEGATIVE NEGATIVE Final    Comment: (NOTE) SARS-CoV-2 target nucleic acids are NOT DETECTED.  The SARS-CoV-2 RNA is generally detectable in upper and lower respiratory specimens during the acute phase of infection. The  lowest concentration of SARS-CoV-2 viral copies this assay can detect is 250 copies / mL. A negative result does not preclude SARS-CoV-2 infection and should not be used as the sole basis for treatment or other patient management decisions.  A negative result may occur with improper specimen collection / handling, submission of specimen other than nasopharyngeal swab, presence of viral mutation(s) within the areas targeted by this assay, and inadequate number of viral copies (<250 copies / mL). A negative result must be combined with clinical observations, patient history, and epidemiological information.  Fact Sheet for Patients:   BoilerBrush.com.cy  Fact Sheet for Healthcare Providers: https://pope.com/  This test is not yet approved or  cleared by the [place] FDA  and has been authorized for detection and/or diagnosis of SARS-CoV-2 by FDA under an Emergency Use Authorization (EUA).  This EUA will remain in effect (meaning this test can be used) for the duration of the COVID-19 declaration under Section 564(b)(1) of the Act, 21 U.S.C. section 360bbb-3(b)(1), unless the authorization is terminated or revoked sooner.  Performed at [facility], [address] [place], [place] [address]   C Difficile Quick Screen w PCR reflex     Status: None   Collection Time: 07/24/20 12:42 AM   Specimen: STOOL  Result Value Ref Range Status   C Diff antigen NEGATIVE NEGATIVE Final   C Diff toxin NEGATIVE NEGATIVE Final   C Diff interpretation No C. difficile detected.  Final    Comment: Performed at [facility], [address], [place], [place] [address]     Medications:    Continuous Infusions: . sodium chloride Stopped (07/24/20 0000)  . heparin 1,500 Units/hr (07/24/20 0315)      LOS: 0 days   Marinda Elk  [facility]  07/24/2020, 6:57 AM

## 2020-07-24 NOTE — H&P (View-Only) (Signed)
CARDIOLOGY CONSULT NOTE  Patient ID: [name] Rice MRN: [id number] DOB/AGE: October 25, 1951 69 y.o.  Admit date: 07/23/2020 Attending physician: Marinda Elk, MD Primary Physician:  [name], PA-C Outpatient Cardiologist: Tessa Lerner, DO, [facility] Inpatient Cardiologist: Tessa Lerner, DO, [facility]  Chief complaint: Lightheaded and dizziness Reason of consultation: Lightheaded/dizziness and abnormal monitor results  HPI:  [name] Rice is a 69 y.o. Caucasian male who presents with a chief complaint of " lightheaded and dizziness." His past medical history and cardiovascular risk factors include: Mildly reduced left ventricular systolic function, left bundle branch block, hyperlipidemia, hypertension, atrial fibrillation, advanced age.  Patient has been followed at [facility] cardiovascular for his underlying atrial fibrillation.  In the recent past he had new onset of left bundle branch block and therefore underwent an echocardiogram.  The echocardiogram noted newly reduced LVEF.  His initial EF was 55 to 60% and most recent LVEF 45-50%.  Given the change in LVEF and regional wall motion abnormalities patient was recommended to undergo stress test.  Patient did undergo nuclear stress test which noted to have perfusion defect suggestive of either left bundle branch block or underlying CAD.  Patient wanted to treat this medically and has been followed up periodically.  He was last seen in the office in August 2021 after a near syncopal event and bradycardia.  At that time patient underwent a Holter monitor results were available as of yesterday 07/23/2020.  Results noted that his underlying rhythm was atrial fibrillation.  He had 20 episodes of nonsustained ventricular tachycardia and 1519 pauses greater than 3 seconds or longer.  The longest pause was 9.5 seconds at 12:36 AM on July 04, 2020.  Patient states that he usually goes to sleep at 9 PM every night and wakes up at 8 AM the following  morning.  Patient has not been evaluated for obstructive sleep apnea.  He does have trouble with lightheadedness, dizziness, and ambulating.  At the last office visit we discontinued his beta-blocker therapy due to his near syncopal event prior to the recent Holter monitor.  ALLERGIES: No Known Allergies  PAST MEDICAL HISTORY: Past Medical History:  Diagnosis Date  . A-fib (HCC)   . Atrial fibrillation (HCC)   . Dysrhythmia   . Hypertension   . LBBB (left bundle branch block)     PAST SURGICAL HISTORY: Past Surgical History:  Procedure Laterality Date  . CARDIOVERSION N/A 09/28/2016   Procedure: CARDIOVERSION;  Surgeon: Yates Decamp, MD;  Location: [facility] ENDOSCOPY;  Service: Cardiovascular;  Laterality: N/A;  . NO PAST SURGERIES      FAMILY HISTORY: The patient family history includes Hypertension in an other family member.   SOCIAL HISTORY:  The patient  reports that he has never smoked. He has never used smokeless tobacco. He reports that he does not drink alcohol and does not use drugs.  MEDICATIONS: Current Outpatient Medications  Medication Instructions  . amLODipine (NORVASC) 10 MG tablet TAKE 1 TABLET BY MOUTH  DAILY  . metoprolol tartrate (LOPRESSOR) 25 MG tablet TAKE 1 TABLET BY MOUTH  TWICE DAILY  . olmesartan (BENICAR) 40 MG tablet TAKE 1 TABLET BY MOUTH  DAILY  . rosuvastatin (CRESTOR) 10 MG tablet TAKE 1 TABLET BY MOUTH  DAILY  . XARELTO 20 MG TABS tablet TAKE 1 TABLET BY MOUTH IN  THE EVENING AFTER DINNER    14 ORGAN REVIEW OF SYSTEMS: Review of Systems  Constitutional: Positive for malaise/fatigue. Negative for chills and fever.  HENT: Negative for hoarse  voice and nosebleeds.   Eyes: Negative for discharge, double vision and pain.  Cardiovascular: Positive for near-syncope (hx of ). Negative for chest pain, claudication, dyspnea on exertion, leg swelling, orthopnea, palpitations, paroxysmal nocturnal dyspnea and syncope.  Respiratory: Negative for  hemoptysis and shortness of breath.   Musculoskeletal: Negative for muscle cramps and myalgias.  Gastrointestinal: Negative for abdominal pain, constipation, diarrhea, hematemesis, hematochezia, melena, nausea and vomiting.  Neurological: Positive for dizziness and light-headedness.   PHYSICAL EXAM: Vitals with BMI 07/24/2020 07/24/2020 07/24/2020  Height - - -  Weight - - -  BMI - - -  Systolic 128 131 865  Diastolic 87 70 74  Pulse 86 90 78    No intake or output data in the 24 hours ending 07/24/20 0913  Net IO Since Admission: No IO data has been entered for this period [07/24/20 0913]   CONSTITUTIONAL: Appears older than stated age, hemodynamically stable, no acute distress.   SKIN: Skin is warm and dry. No rash noted. No cyanosis. No pallor. No jaundice HEAD: Normocephalic and atraumatic.  EYES: No scleral icterus MOUTH/THROAT: Moist oral membranes.  NECK: No JVD present. No thyromegaly noted. No carotid bruits  LYMPHATIC: No visible cervical adenopathy.  CHEST Normal respiratory effort. No intercostal retractions  LUNGS: Clear to auscultation bilaterally.  No stridor. No wheezes. No rales.  CARDIOVASCULAR: Irregularly irregular, positive S1-S2, no murmurs rubs or gallops appreciated. ABDOMINAL: No apparent ascites.  EXTREMITIES: No peripheral edema  HEMATOLOGIC: No significant bruising NEUROLOGIC: Oriented to person, place, and time. Nonfocal. Normal muscle tone.  PSYCHIATRIC: Normal mood and affect. Normal behavior. Cooperative  RADIOLOGY: DG Abd 1 View  Result Date: 07/24/2020 CLINICAL DATA:  Diarrhea for 5 days EXAM: ABDOMEN - 1 VIEW COMPARISON:  Nine hundred eighteen FINDINGS: 2 supine frontal views of the abdomen and pelvis are obtained. Bowel gas pattern is unremarkable without obstruction or ileus. No masses or abnormal calcifications. The lung bases are clear. IMPRESSION: 1. Unremarkable bowel gas pattern. Electronically Signed   By: Sharlet Salina M.D.   On:  07/24/2020 01:25    LABORATORY DATA: Lab Results  Component Value Date   WBC 7.3 07/24/2020   HGB 12.0 (L) 07/24/2020   HCT 38.8 (L) 07/24/2020   MCV 90.4 07/24/2020   PLT 303 07/24/2020    Recent Labs  Lab 07/23/20 1221 07/23/20 1221 07/24/20 0210  NA 136   < > 137  K 3.4*   < > 3.5  CL 105   < > 108  CO2 21*   < > 18*  BUN 11   < > 17  CREATININE 1.20   < > 1.74*  CALCIUM 8.6*   < > 8.3*  PROT 6.6  --   --   BILITOT 2.0*  --   --   ALKPHOS 166*  --   --   ALT 36  --   --   AST 48*  --   --   GLUCOSE 137*   < > 104*   < > = values in this interval not displayed.    Lipid Panel     Component Value Date/Time   CHOL 143 07/10/2019 0906   TRIG 97 07/10/2019 0906   HDL 51 07/10/2019 0906   CHOLHDL 2.8 07/10/2019 0906   LDLCALC 74 07/10/2019 0906    BNP (last 3 results) No results for input(s): BNP in the last 8760 hours.  HEMOGLOBIN A1C No results found for: HGBA1C, MPG  Cardiac Panel (last 3 results)  Recent Labs    07/24/20 0210  CKTOTAL 39*    Lab Results  Component Value Date   CKTOTAL 39 (L) 07/24/2020     TSH Recent Labs    07/02/20 1604  TSH 1.898    CARDIAC DATABASE: EKG: 07/23/2020: Atrial fibrillation, 65 bpm, left axis deviation, left bundle branch block.  Echocardiogram: 12/31/2019: LVEF 45-50%, mildly reduced, with regional wall motion abnormalities.Left ventricle regional wall motion findings: Mid anteroseptal, Mid inferoseptal, Apical anterior, Apical septal and Apical cap hypokinesis. Mild left ventricular hypertrophy. Left atrial cavity is severely dilated. Right atrial cavity is slightly dilated. Right ventricle cavity is normal in size. Low normal right ventricular function. Mild tricuspid regurgitation. Mild pulmonary hypertension RVSP measures 35 mmHg.   Stress Testing:  02/18/2020: No previous exam available for comparison. Lexiscan nuclear stress test performed using 1-day protocol. Stress EKG is non-diagnostic, as this is  pharmacological stress test. In addition, rest and stress EKG showed atrial fibrillation with slow ventricular response, anterolateral T wave inversion. Stress LVEF 77%. Normal myocardial perfusion. Low risk study.   Heart Catheterization: NA  14 day extended Holter monitor: Dominant rhythm atrial fibrillation (HR between 25-137bpm, avg. 59bpm).  Overall HR 25-197 bpm.  Avg HR 59 bpm. 1519 pauses that were 3 secs or longer.  The longest pause 9.5 sec at 12:36 AM (07/04/2020), followed by 9.4 sec pause at 4:30 AM (07/11/2020), third longest pause 8.9 sec (07/08/2020).  These are auto-triggered events. 20 episodes of NSVT reported.  Longest episode 20 beats in duration for 10.5 seconds at an average rate 116 bpm.  The fastest episode was 6 beats in duration at average rate of 170 bpm.  Total ventricular ectopic burden <1%. Patient activated events: 0.   IMPRESSION & RECOMMENDATIONS: [name] Rice is a 69 y.o. Caucasian male whose past medical history and cardiovascular risk factors include: Mildly reduced left ventricular systolic function, left bundle branch block, hyperlipidemia, hypertension, atrial fibrillation, advanced age.  Impression: Lightheaded and dizziness. Nonsustained ventricular tachycardia. Ventricular standstill Permanent atrial fibrillation Long-term oral anticoagulation Left bundle branch block Hyperlipidemia Hypertension.   Plan:  Lightheaded and dizziness:  Patient has had recent episodes of near syncope and bradycardia after which his beta-blockers were discontinued and recommended to undergo Holter monitor.  Holter monitor results noted above.  Patient would benefit from an opinion from cardiac electrophysiology in regards to if a pacemaker is clinically warranted.    Check orthostatic vital signs.  Start 0.9 normal saline at 70 cc an hour for the next 24 hours.  Noncardiac work-up per primary team  Nonsustained ventricular tachycardia:   Not able to be  placed on beta-blocker therapy due to his bradycardia and near syncope events.  Echocardiogram in the office noted mildly reduced left ventricular systolic function with regional wall motion abnormalities.  Discussed undergoing a left heart catheterization to rule out obstructive CAD.  Patient is in agreement.  The procedure of left heart catheterization with possible intervention was explained to the patient in detail.   The indication, alternatives, risks and benefits were reviewed.   Complications include but not limited to bleeding, infection, vascular injury, stroke, myocardial infection, arrhythmia, kidney injury, radiation-related injury in the case of prolonged fluoroscopy use, emergency cardiac surgery, and death. The patient understands the risks of serious complication is 1-2 in 1000 with diagnostic cardiac cath and 1-2% or less with angioplasty/stenting.   I have discussed in particular detail the possibility of acute renal failure after coronary angiography particularly if PCI is necessary.  I have described that it is possible patient may need short-term dialysis and the less likely may also require long-term dialysis depending on renal recovery.  I have asked for consent to proceed with coronary angiography understanding the risks of potentially needing dialysis, as well as the risks as stated above.    The patient voices understanding and provides verbal feedback and wishes to proceed with coronary angiography with possible PCI.  Continue telemetry.  Permanent atrial fibrillation:  Patient was on AV nodal blocking agents prior to his episodes of near syncope and bradycardia.  Currently on oral anticoagulation for thromboembolic prophylaxis.  He has failed cardioversion in the past.  Continue to monitor.  Continue telemetry.   Time spent: 85 mins  Patient's questions and concerns were addressed to his satisfaction. He voices understanding of the instructions provided during  this encounter.   This note was created using a voice recognition software as a result there may be grammatical errors inadvertently enclosed that do not reflect the nature of this encounter. Every attempt is made to correct such errors.  Delilah ShanSunit Nadiah Corbit, DO, [facility]  Pager: [phone] Office: [phone] 07/24/2020, 9:13 AM

## 2020-07-24 NOTE — ED Notes (Signed)
Resting on stretcher no complaints at present.

## 2020-07-24 NOTE — Progress Notes (Signed)
ANTICOAGULATION CONSULT NOTE - Initial Consult  Pharmacy Consult for heparin Indication: atrial fibrillation  No Known Allergies  Patient Measurements: Height: 6\' 1"  (185.4 cm) Weight: 113.4 kg (250 lb) IBW/kg (Calculated) : 79.9 Heparin Dosing Weight: 105kg  Vital Signs: BP: 100/65 (09/15 2315) Pulse Rate: 80 (09/15 2315)  Labs: Recent Labs    07/23/20 1221  HGB 13.0  HCT 41.2  PLT 325  CREATININE 1.20    Estimated Creatinine Clearance: 78.8 mL/min (by C-G formula based on SCr of 1.2 mg/dL).   Medical History: Past Medical History:  Diagnosis Date  . A-fib (HCC)   . Atrial fibrillation (HCC)   . Dysrhythmia   . Hypertension   . LBBB (left bundle branch block)     Assessment: 69yo male c/o dizziness and diarrhea, admitted for further w/u, to transition from Xarelto to heparin for Afib w/ possibility of procedure; last dose of Xarelto taken Tues pm.  Goal of Therapy:  Heparin level 0.3-0.7 units/ml aPTT 66-102 seconds Monitor platelets by anticoagulation protocol: Yes   Plan:  Will start heparin gtt at 1500 units/hr and monitor heparin levels, aPTT (while Xarelto affects anti-Xa), and CBC.  69yo, PharmD, BCPS  07/24/2020,1:35 AM

## 2020-07-24 NOTE — Consult Note (Signed)
CARDIOLOGY CONSULT NOTE  Patient ID: [name] Rice MRN: [id number] DOB/AGE: October 25, 1951 69 y.o.  Admit date: 07/23/2020 Attending physician: Marinda Elk, MD Primary Physician:  [name], PA-C Outpatient Cardiologist: Tessa Lerner, DO, [facility] Inpatient Cardiologist: Tessa Lerner, DO, [facility]  Chief complaint: Lightheaded and dizziness Reason of consultation: Lightheaded/dizziness and abnormal monitor results  HPI:  [name] Rice is a 69 y.o. Caucasian male who presents with a chief complaint of " lightheaded and dizziness." His past medical history and cardiovascular risk factors include: Mildly reduced left ventricular systolic function, left bundle branch block, hyperlipidemia, hypertension, atrial fibrillation, advanced age.  Patient has been followed at [facility] cardiovascular for his underlying atrial fibrillation.  In the recent past he had new onset of left bundle branch block and therefore underwent an echocardiogram.  The echocardiogram noted newly reduced LVEF.  His initial EF was 55 to 60% and most recent LVEF 45-50%.  Given the change in LVEF and regional wall motion abnormalities patient was recommended to undergo stress test.  Patient did undergo nuclear stress test which noted to have perfusion defect suggestive of either left bundle branch block or underlying CAD.  Patient wanted to treat this medically and has been followed up periodically.  He was last seen in the office in August 2021 after a near syncopal event and bradycardia.  At that time patient underwent a Holter monitor results were available as of yesterday 07/23/2020.  Results noted that his underlying rhythm was atrial fibrillation.  He had 20 episodes of nonsustained ventricular tachycardia and 1519 pauses greater than 3 seconds or longer.  The longest pause was 9.5 seconds at 12:36 AM on July 04, 2020.  Patient states that he usually goes to sleep at 9 PM every night and wakes up at 8 AM the following  morning.  Patient has not been evaluated for obstructive sleep apnea.  He does have trouble with lightheadedness, dizziness, and ambulating.  At the last office visit we discontinued his beta-blocker therapy due to his near syncopal event prior to the recent Holter monitor.  ALLERGIES: No Known Allergies  PAST MEDICAL HISTORY: Past Medical History:  Diagnosis Date  . A-fib (HCC)   . Atrial fibrillation (HCC)   . Dysrhythmia   . Hypertension   . LBBB (left bundle branch block)     PAST SURGICAL HISTORY: Past Surgical History:  Procedure Laterality Date  . CARDIOVERSION N/A 09/28/2016   Procedure: CARDIOVERSION;  Surgeon: Yates Decamp, MD;  Location: [facility] ENDOSCOPY;  Service: Cardiovascular;  Laterality: N/A;  . NO PAST SURGERIES      FAMILY HISTORY: The patient family history includes Hypertension in an other family member.   SOCIAL HISTORY:  The patient  reports that he has never smoked. He has never used smokeless tobacco. He reports that he does not drink alcohol and does not use drugs.  MEDICATIONS: Current Outpatient Medications  Medication Instructions  . amLODipine (NORVASC) 10 MG tablet TAKE 1 TABLET BY MOUTH  DAILY  . metoprolol tartrate (LOPRESSOR) 25 MG tablet TAKE 1 TABLET BY MOUTH  TWICE DAILY  . olmesartan (BENICAR) 40 MG tablet TAKE 1 TABLET BY MOUTH  DAILY  . rosuvastatin (CRESTOR) 10 MG tablet TAKE 1 TABLET BY MOUTH  DAILY  . XARELTO 20 MG TABS tablet TAKE 1 TABLET BY MOUTH IN  THE EVENING AFTER DINNER    14 ORGAN REVIEW OF SYSTEMS: Review of Systems  Constitutional: Positive for malaise/fatigue. Negative for chills and fever.  HENT: Negative for hoarse  voice and nosebleeds.   Eyes: Negative for discharge, double vision and pain.  Cardiovascular: Positive for near-syncope (hx of ). Negative for chest pain, claudication, dyspnea on exertion, leg swelling, orthopnea, palpitations, paroxysmal nocturnal dyspnea and syncope.  Respiratory: Negative for  hemoptysis and shortness of breath.   Musculoskeletal: Negative for muscle cramps and myalgias.  Gastrointestinal: Negative for abdominal pain, constipation, diarrhea, hematemesis, hematochezia, melena, nausea and vomiting.  Neurological: Positive for dizziness and light-headedness.   PHYSICAL EXAM: Vitals with BMI 07/24/2020 07/24/2020 07/24/2020  Height - - -  Weight - - -  BMI - - -  Systolic 128 131 865  Diastolic 87 70 74  Pulse 86 90 78    No intake or output data in the 24 hours ending 07/24/20 0913  Net IO Since Admission: No IO data has been entered for this period [07/24/20 0913]   CONSTITUTIONAL: Appears older than stated age, hemodynamically stable, no acute distress.   SKIN: Skin is warm and dry. No rash noted. No cyanosis. No pallor. No jaundice HEAD: Normocephalic and atraumatic.  EYES: No scleral icterus MOUTH/THROAT: Moist oral membranes.  NECK: No JVD present. No thyromegaly noted. No carotid bruits  LYMPHATIC: No visible cervical adenopathy.  CHEST Normal respiratory effort. No intercostal retractions  LUNGS: Clear to auscultation bilaterally.  No stridor. No wheezes. No rales.  CARDIOVASCULAR: Irregularly irregular, positive S1-S2, no murmurs rubs or gallops appreciated. ABDOMINAL: No apparent ascites.  EXTREMITIES: No peripheral edema  HEMATOLOGIC: No significant bruising NEUROLOGIC: Oriented to person, place, and time. Nonfocal. Normal muscle tone.  PSYCHIATRIC: Normal mood and affect. Normal behavior. Cooperative  RADIOLOGY: DG Abd 1 View  Result Date: 07/24/2020 CLINICAL DATA:  Diarrhea for 5 days EXAM: ABDOMEN - 1 VIEW COMPARISON:  Nine hundred eighteen FINDINGS: 2 supine frontal views of the abdomen and pelvis are obtained. Bowel gas pattern is unremarkable without obstruction or ileus. No masses or abnormal calcifications. The lung bases are clear. IMPRESSION: 1. Unremarkable bowel gas pattern. Electronically Signed   By: Sharlet Salina M.D.   On:  07/24/2020 01:25    LABORATORY DATA: Lab Results  Component Value Date   WBC 7.3 07/24/2020   HGB 12.0 (L) 07/24/2020   HCT 38.8 (L) 07/24/2020   MCV 90.4 07/24/2020   PLT 303 07/24/2020    Recent Labs  Lab 07/23/20 1221 07/23/20 1221 07/24/20 0210  NA 136   < > 137  K 3.4*   < > 3.5  CL 105   < > 108  CO2 21*   < > 18*  BUN 11   < > 17  CREATININE 1.20   < > 1.74*  CALCIUM 8.6*   < > 8.3*  PROT 6.6  --   --   BILITOT 2.0*  --   --   ALKPHOS 166*  --   --   ALT 36  --   --   AST 48*  --   --   GLUCOSE 137*   < > 104*   < > = values in this interval not displayed.    Lipid Panel     Component Value Date/Time   CHOL 143 07/10/2019 0906   TRIG 97 07/10/2019 0906   HDL 51 07/10/2019 0906   CHOLHDL 2.8 07/10/2019 0906   LDLCALC 74 07/10/2019 0906    BNP (last 3 results) No results for input(s): BNP in the last 8760 hours.  HEMOGLOBIN A1C No results found for: HGBA1C, MPG  Cardiac Panel (last 3 results)  Recent Labs    07/24/20 0210  CKTOTAL 39*    Lab Results  Component Value Date   CKTOTAL 39 (L) 07/24/2020     TSH Recent Labs    07/02/20 1604  TSH 1.898    CARDIAC DATABASE: EKG: 07/23/2020: Atrial fibrillation, 65 bpm, left axis deviation, left bundle branch block.  Echocardiogram: 12/31/2019: LVEF 45-50%, mildly reduced, with regional wall motion abnormalities.Left ventricle regional wall motion findings: Mid anteroseptal, Mid inferoseptal, Apical anterior, Apical septal and Apical cap hypokinesis. Mild left ventricular hypertrophy. Left atrial cavity is severely dilated. Right atrial cavity is slightly dilated. Right ventricle cavity is normal in size. Low normal right ventricular function. Mild tricuspid regurgitation. Mild pulmonary hypertension RVSP measures 35 mmHg.   Stress Testing:  02/18/2020: No previous exam available for comparison. Lexiscan nuclear stress test performed using 1-day protocol. Stress EKG is non-diagnostic, as this is  pharmacological stress test. In addition, rest and stress EKG showed atrial fibrillation with slow ventricular response, anterolateral T wave inversion. Stress LVEF 77%. Normal myocardial perfusion. Low risk study.   Heart Catheterization: NA  14 day extended Holter monitor: Dominant rhythm atrial fibrillation (HR between 25-137bpm, avg. 59bpm).  Overall HR 25-197 bpm.  Avg HR 59 bpm. 1519 pauses that were 3 secs or longer.  The longest pause 9.5 sec at 12:36 AM (07/04/2020), followed by 9.4 sec pause at 4:30 AM (07/11/2020), third longest pause 8.9 sec (07/08/2020).  These are auto-triggered events. 20 episodes of NSVT reported.  Longest episode 20 beats in duration for 10.5 seconds at an average rate 116 bpm.  The fastest episode was 6 beats in duration at average rate of 170 bpm.  Total ventricular ectopic burden <1%. Patient activated events: 0.   IMPRESSION & RECOMMENDATIONS: [name] Rice is a 69 y.o. Caucasian male whose past medical history and cardiovascular risk factors include: Mildly reduced left ventricular systolic function, left bundle branch block, hyperlipidemia, hypertension, atrial fibrillation, advanced age.  Impression: Lightheaded and dizziness. Nonsustained ventricular tachycardia. Ventricular standstill Permanent atrial fibrillation Long-term oral anticoagulation Left bundle branch block Hyperlipidemia Hypertension.   Plan:  Lightheaded and dizziness:  Patient has had recent episodes of near syncope and bradycardia after which his beta-blockers were discontinued and recommended to undergo Holter monitor.  Holter monitor results noted above.  Patient would benefit from an opinion from cardiac electrophysiology in regards to if a pacemaker is clinically warranted.    Check orthostatic vital signs.  Start 0.9 normal saline at 70 cc an hour for the next 24 hours.  Noncardiac work-up per primary team  Nonsustained ventricular tachycardia:   Not able to be  placed on beta-blocker therapy due to his bradycardia and near syncope events.  Echocardiogram in the office noted mildly reduced left ventricular systolic function with regional wall motion abnormalities.  Discussed undergoing a left heart catheterization to rule out obstructive CAD.  Patient is in agreement.  The procedure of left heart catheterization with possible intervention was explained to the patient in detail.   The indication, alternatives, risks and benefits were reviewed.   Complications include but not limited to bleeding, infection, vascular injury, stroke, myocardial infection, arrhythmia, kidney injury, radiation-related injury in the case of prolonged fluoroscopy use, emergency cardiac surgery, and death. The patient understands the risks of serious complication is 1-2 in 1000 with diagnostic cardiac cath and 1-2% or less with angioplasty/stenting.   I have discussed in particular detail the possibility of acute renal failure after coronary angiography particularly if PCI is necessary.  I have described that it is possible patient may need short-term dialysis and the less likely may also require long-term dialysis depending on renal recovery.  I have asked for consent to proceed with coronary angiography understanding the risks of potentially needing dialysis, as well as the risks as stated above.    The patient voices understanding and provides verbal feedback and wishes to proceed with coronary angiography with possible PCI.  Continue telemetry.  Permanent atrial fibrillation:  Patient was on AV nodal blocking agents prior to his episodes of near syncope and bradycardia.  Currently on oral anticoagulation for thromboembolic prophylaxis.  He has failed cardioversion in the past.  Continue to monitor.  Continue telemetry.   Time spent: 85 mins  Patient's questions and concerns were addressed to his satisfaction. He voices understanding of the instructions provided during  this encounter.   This note was created using a voice recognition software as a result there may be grammatical errors inadvertently enclosed that do not reflect the nature of this encounter. Every attempt is made to correct such errors.  Rivky Clendenning, DO, FACC  Pager: [phone] Office: [phone] 07/24/2020, 9:13 AM  

## 2020-07-24 NOTE — ED Notes (Signed)
Walked patient to the bathroom patient did well 

## 2020-07-24 NOTE — Progress Notes (Signed)
  Echocardiogram 2D Echocardiogram has been performed.  Celene Skeen 07/24/2020, 9:55 AM

## 2020-07-24 NOTE — Progress Notes (Signed)
ANTICOAGULATION CONSULT NOTE  Pharmacy Consult for heparin Indication: atrial fibrillation  No Known Allergies  Patient Measurements: Height: 6\' 1"  (185.4 cm) Weight: 113.4 kg (250 lb) IBW/kg (Calculated) : 79.9 Heparin Dosing Weight: 105kg  Vital Signs: Temp: 97.8 F (36.6 C) (09/16 0852) Temp Source: Oral (09/16 0852) BP: 149/77 (09/16 1028) Pulse Rate: 93 (09/16 1028)  Labs: Recent Labs    07/23/20 1221 07/23/20 1221 07/24/20 0210 07/24/20 0748 07/24/20 1122  HGB 13.0   < >  --  12.0* 12.8*  HCT 41.2  --   --  38.8* 42.7  PLT 325  --   --  303 295  APTT  --   --   --   --  83*  HEPARINUNFRC  --   --   --   --  1.08*  CREATININE 1.20  --  1.74* 1.45*  --   CKTOTAL  --   --  39*  --   --   TROPONINIHS  --   --  45*  --   --    < > = values in this interval not displayed.    Estimated Creatinine Clearance: 65.2 mL/min (A) (by C-G formula based on SCr of 1.45 mg/dL (H)).  Assessment: 69yo male c/o dizziness and diarrhea. He is on chronic xarelto but transitioned to heparin for afib. Initial heparin level is elevated as expected due to impact from xarelto but aPTT is therapeutic. No bleeding noted and CBC is stable.   Goal of Therapy:  Heparin level 0.3-0.7 units/ml aPTT 66-102 seconds Monitor platelets by anticoagulation protocol: Yes   Plan:  Continue heparin gtt 1500 units/hr Daily aPTT, heparin level and CBC  69yo, PharmD, BCPS Clinical Pharmacist Please see AMION for all pharmacy numbers 07/24/2020 12:21 PM

## 2020-07-24 NOTE — ED Notes (Signed)
Pt moved to a regular hospital bed for comfort blood sent to lab

## 2020-07-24 NOTE — ED Notes (Signed)
Pt ambulatory to bathroom w standby assist.

## 2020-07-24 NOTE — ED Notes (Signed)
Patient is back in bed on the monitor with call bell in reach  

## 2020-07-24 NOTE — Telephone Encounter (Signed)
Can you see if there are any availabilities on Dr. Tolia schedule?

## 2020-07-25 ENCOUNTER — Encounter (HOSPITAL_COMMUNITY): Payer: Self-pay | Admitting: Cardiology

## 2020-07-25 ENCOUNTER — Encounter (HOSPITAL_COMMUNITY)
Admission: EM | Disposition: A | Discharge status: Skilled Nursing Facility | Payer: Self-pay | Source: Home / Self Care | Attending: Cardiothoracic Surgery

## 2020-07-25 ENCOUNTER — Other Ambulatory Visit: Payer: Self-pay | Admitting: *Deleted

## 2020-07-25 DIAGNOSIS — I2510 Atherosclerotic heart disease of native coronary artery without angina pectoris: Principal | ICD-10-CM

## 2020-07-25 DIAGNOSIS — I2583 Coronary atherosclerosis due to lipid rich plaque: Secondary | ICD-10-CM

## 2020-07-25 DIAGNOSIS — I4821 Permanent atrial fibrillation: Secondary | ICD-10-CM

## 2020-07-25 HISTORY — PX: LEFT HEART CATH AND CORONARY ANGIOGRAPHY: CATH118249

## 2020-07-25 LAB — BASIC METABOLIC PANEL
Anion gap: 7 (ref 5–15)
BUN: 11 mg/dL (ref 8–23)
CO2: 19 mmol/L — ABNORMAL LOW (ref 22–32)
Calcium: 8.3 mg/dL — ABNORMAL LOW (ref 8.9–10.3)
Chloride: 112 mmol/L — ABNORMAL HIGH (ref 98–111)
Creatinine, Ser: 1.07 mg/dL (ref 0.61–1.24)
GFR calc Af Amer: >60 mL/min (ref >60)
GFR calc non Af Amer: >60 mL/min (ref >60)
Glucose, Bld: 86 mg/dL (ref 70–99)
Potassium: 3.8 mmol/L (ref 3.5–5.1)
Sodium: 138 mmol/L (ref 135–145)

## 2020-07-25 LAB — CBC
HCT: 36.5 % — ABNORMAL LOW (ref 39.0–52.0)
Hemoglobin: 11.4 g/dL — ABNORMAL LOW (ref 13.0–17.0)
MCH: 28.9 pg (ref 26.0–34.0)
MCHC: 31.2 g/dL (ref 30.0–36.0)
MCV: 92.4 fL (ref 80.0–100.0)
Platelets: 285 10*3/uL (ref 150–400)
RBC: 3.95 MIL/uL — ABNORMAL LOW (ref 4.22–5.81)
RDW: 14.5 % (ref 11.5–15.5)
WBC: 6.2 10*3/uL (ref 4.0–10.5)
nRBC: 0 % (ref 0.0–0.2)

## 2020-07-25 LAB — HEPARIN LEVEL (UNFRACTIONATED): Heparin Unfractionated: 0.58 IU/mL (ref 0.30–0.70)

## 2020-07-25 LAB — APTT: aPTT: 105 seconds — ABNORMAL HIGH (ref 24–36)

## 2020-07-25 SURGERY — LEFT HEART CATH AND CORONARY ANGIOGRAPHY
Anesthesia: LOCAL

## 2020-07-25 MED ORDER — METOPROLOL TARTRATE 25 MG PO TABS
25.0000 mg | ORAL_TABLET | Freq: Two times a day (BID) | ORAL | Status: DC
  Administered 2020-07-25: 25 mg via ORAL
  Filled 2020-07-25: qty 1

## 2020-07-25 MED ORDER — DIPHENHYDRAMINE HCL 25 MG PO CAPS
25.0000 mg | ORAL_CAPSULE | Freq: Every evening | ORAL | Status: DC | PRN
  Administered 2020-07-25 – 2020-07-27 (×3): 25 mg via ORAL
  Filled 2020-07-25 (×3): qty 1

## 2020-07-25 MED ORDER — FENTANYL CITRATE (PF) 100 MCG/2ML IJ SOLN
INTRAMUSCULAR | Status: DC | PRN
Start: 2020-07-25 — End: 2020-07-25
  Administered 2020-07-25: 25 ug via INTRAVENOUS

## 2020-07-25 MED ORDER — IOHEXOL 350 MG/ML SOLN
INTRAVENOUS | Status: DC | PRN
  Administered 2020-07-25: 65 mL

## 2020-07-25 MED ORDER — FENTANYL CITRATE (PF) 100 MCG/2ML IJ SOLN
INTRAMUSCULAR | Status: AC
  Filled 2020-07-25: qty 2

## 2020-07-25 MED ORDER — HEPARIN (PORCINE) 25000 UT/250ML-% IV SOLN
2000.0000 [IU]/h | INTRAVENOUS | Status: DC
  Administered 2020-07-25 – 2020-07-26 (×3): 1500 [IU]/h via INTRAVENOUS
  Administered 2020-07-27: 1900 [IU]/h via INTRAVENOUS
  Administered 2020-07-28: 2000 [IU]/h via INTRAVENOUS
  Filled 2020-07-25 (×6): qty 250

## 2020-07-25 MED ORDER — MIDAZOLAM HCL 2 MG/2ML IJ SOLN
INTRAMUSCULAR | Status: DC | PRN
  Administered 2020-07-25 (×2): 1 mg via INTRAVENOUS

## 2020-07-25 MED ORDER — ASPIRIN 81 MG PO CHEW: 81.0000 mg | CHEWABLE_TABLET | ORAL | Status: DC

## 2020-07-25 MED ORDER — MIDAZOLAM HCL 2 MG/2ML IJ SOLN
INTRAMUSCULAR | Status: AC
  Filled 2020-07-25: qty 2

## 2020-07-25 MED ORDER — SODIUM CHLORIDE 0.9% FLUSH
3.0000 mL | INTRAVENOUS | Status: DC | PRN
  Administered 2020-07-27: 3 mL via INTRAVENOUS

## 2020-07-25 MED ORDER — LIDOCAINE HCL (PF) 1 % IJ SOLN
INTRAMUSCULAR | Status: DC | PRN
  Administered 2020-07-25: 2 mL via INTRADERMAL

## 2020-07-25 MED ORDER — HEPARIN (PORCINE) IN NACL 2000-0.9 UNIT/L-% IV SOLN
INTRAVENOUS | Status: DC | PRN
  Administered 2020-07-25: 5000 mL

## 2020-07-25 MED ORDER — VERAPAMIL HCL 2.5 MG/ML IV SOLN
INTRAVENOUS | Status: AC
  Filled 2020-07-25: qty 2

## 2020-07-25 MED ORDER — HEPARIN SODIUM (PORCINE) 1000 UNIT/ML IJ SOLN
INTRAMUSCULAR | Status: AC
  Filled 2020-07-25: qty 1

## 2020-07-25 MED ORDER — LIDOCAINE HCL (PF) 1 % IJ SOLN
INTRAMUSCULAR | Status: AC
  Filled 2020-07-25: qty 30

## 2020-07-25 MED ORDER — HEPARIN (PORCINE) IN NACL 1000-0.9 UT/500ML-% IV SOLN
INTRAVENOUS | Status: AC
  Filled 2020-07-25: qty 1000

## 2020-07-25 MED ORDER — SODIUM CHLORIDE 0.9 % IV SOLN: 250.0000 mL | INTRAVENOUS | Status: DC | PRN

## 2020-07-25 MED ORDER — SODIUM CHLORIDE 0.9 % WEIGHT BASED INFUSION
1.0000 mL/kg/h | INTRAVENOUS | Status: AC
  Administered 2020-07-25: 1 mL/kg/h via INTRAVENOUS

## 2020-07-25 MED ORDER — HYDRALAZINE HCL 20 MG/ML IJ SOLN: 10.0000 mg | INTRAMUSCULAR | Status: AC | PRN

## 2020-07-25 MED ORDER — HEPARIN (PORCINE) IN NACL 1000-0.9 UT/500ML-% IV SOLN
INTRAVENOUS | Status: DC | PRN
  Administered 2020-07-25 (×2): 500 mL

## 2020-07-25 MED ORDER — SODIUM CHLORIDE 0.9% FLUSH
3.0000 mL | Freq: Two times a day (BID) | INTRAVENOUS | Status: DC
  Administered 2020-07-26 – 2020-07-27 (×4): 3 mL via INTRAVENOUS

## 2020-07-25 MED ORDER — ASPIRIN EC 81 MG PO TBEC
81.0000 mg | DELAYED_RELEASE_TABLET | Freq: Every day | ORAL | Status: DC
  Administered 2020-07-26 – 2020-07-28 (×3): 81 mg via ORAL
  Filled 2020-07-25 (×3): qty 1

## 2020-07-25 MED ORDER — VERAPAMIL HCL 2.5 MG/ML IV SOLN
INTRAVENOUS | Status: DC | PRN
  Administered 2020-07-25: 5 mL via INTRA_ARTERIAL

## 2020-07-25 SURGICAL SUPPLY — 9 items
CATH OPTITORQUE TIG 4.0 5F (CATHETERS) ×1 IMPLANT
DEVICE RAD COMP TR BAND LRG (VASCULAR PRODUCTS) ×1 IMPLANT
GLIDESHEATH SLEND A-KIT 6F 22G (SHEATH) ×1 IMPLANT
GUIDEWIRE INQWIRE 1.5J.035X260 (WIRE) IMPLANT
INQWIRE 1.5J .035X260CM (WIRE) ×2
KIT HEART LEFT (KITS) ×2 IMPLANT
PACK CARDIAC CATHETERIZATION (CUSTOM PROCEDURE TRAY) ×2 IMPLANT
TRANSDUCER W/STOPCOCK (MISCELLANEOUS) ×2 IMPLANT
TUBING CIL FLEX 10 FLL-RA (TUBING) ×2 IMPLANT

## 2020-07-25 NOTE — CV Procedure (Signed)
  Ostial circumflex 99% involving a moderate sized ramus with ostial 80 to 90% and proximal 90% stenosis.  LAD diffusely diseased, proximal diffuse 80% followed by tandem 90% stenosis.  Large D1 with moderate diffuse disease and proximal and mid tandem 70 and 80% stenosis.  Has secondary branches and is tortuous.    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  A secondary PL branch is subtotally occluded.  RCA: Proximal RCA 80% stenosis, large vessel with mild disease in PL and PDA branches.  A secondary PL branch is subtotally occluded. LM: Distal 30-40% stenosis. LAD: LAD diffusely diseased, proximal diffuse 80% followed by tandem 90% stenosis.  Large D1 with moderate diffuse disease and proximal and mid tandem 70 and 80% stenosis.  Has secondary branches and is tortuous. Cx  and RI: Ostial circumflex 99% involving a moderate sized ramus with ostial 80 to 90% and proximal 90% stenosis. Normal LVEDP.  Patent LIMA and RIMA and RIMA has ostial 20-30% stenosis. Subclavian arteries widely patent.  Right radial diffusely disease by [place].   Yates Decamp, MD, [facility] 07/25/2020, 8:36 AM Office: [phone]

## 2020-07-25 NOTE — Interval H&P Note (Signed)
History and Physical Interval Note:  07/25/2020 7:42 AM  [name] Rice  has presented today for surgery, with the diagnosis of chest pain.  The various methods of treatment have been discussed with the patient and family. After consideration of risks, benefits and other options for treatment, the patient has consented to  Procedure(s): LEFT HEART CATH AND CORONARY ANGIOGRAPHY (N/A) and possible angioplasty as a surgical intervention.  The patient's history has been reviewed, patient examined, no change in status, stable for surgery.  I have reviewed the patient's chart and labs.  Questions were answered to the patient's satisfaction.   Cath Lab Visit (complete for each Cath Lab visit)  Clinical Evaluation Leading to the Procedure:   ACS: Yes.    Non-ACS:    Anginal Classification: CCS IV  Anti-ischemic medical therapy: Minimal Therapy (1 class of medications)  Non-Invasive Test Results: No non-invasive testing performed  Prior CABG: No previous CABG   Yates Decamp

## 2020-07-25 NOTE — Progress Notes (Signed)
PROGRESS NOTE  [name] Rice IRS:[id number] DOB: 03/31/1951 DOA: 07/23/2020 PCP: [name], PA-C   LOS: 1 day   Brief Narrative / Interim history: 69 year old male with history of A. fib on Xarelto, left bundle branch block, chronic systolic CHF with an EF of 45% back in February 2020, comes to the hospital with lightheadedness.  He was evaluated as an outpatient by his cardiologist, and due to concern for arrhythmias a Holter monitor was placed.  His metoprolol was also discontinued.  He was admitted to the hospital, cardiology was consulted and he underwent a cardiac cath on 9/16 with recommendations for CABG  Subjective / 24h Interval events: Quite emotional post cath given multivessel disease and need for heart surgery.  Assessment & Plan: Principal Problem Lightheadedness/presyncope -appreciate cardiology consultation.  The 14-day extended Holter monitor had significant events with heart rate varying 25-197 bpm's, several pauses more than 3 seconds as well as couple of pauses of around 9 seconds.  There are also reported NSVT.   Active Problems Coronary artery disease -given arrhythmia as above underwent on 9/17 which showed significant multivessel disease.  Cardiothoracic surgery consulted, awaiting consultation  Chronic atrial fibrillation - Xarelto has been held he was started on IV heparin continue to hold beta-blockers.  Essential hypertension with chronic kidney disease stage IIIa -Holding all antihypertensive medication.  Acute kidney injury on chronic kidney disease stage IIIa -creatinine as high as 1.7, currently 1.0.  Possibly hemodynamically mediated with hypotension at home  Hypercholesterolemia -Continue statins.  Hypokalemia -Replete orally.  Diarrhea -Of unclear etiology C. difficile PCR is negative. Started on Metamucil and Imodium.  Scheduled Meds: . [START ON 07/26/2020] aspirin EC  81 mg Oral Daily  . psyllium  1 packet Oral Daily  .  rosuvastatin  10 mg Oral Daily  . sodium chloride flush  3 mL Intravenous Q12H  . sodium chloride flush  3 mL Intravenous Q12H  . sodium chloride flush  3 mL Intravenous Q12H   Continuous Infusions: . sodium chloride    . sodium chloride 1 mL/kg/hr (07/25/20 0857)  . heparin     PRN Meds:.sodium chloride, acetaminophen **OR** acetaminophen, diphenhydrAMINE, hydrALAZINE, HYDROcodone-acetaminophen, loperamide, ondansetron **OR** ondansetron (ZOFRAN) IV, sodium chloride flush  Diet Orders (From admission, onward)    Start     Ordered   07/25/20 0841  Diet Heart Room service appropriate? Yes; Fluid consistency: Thin  Diet effective now       Question Answer Comment  Room service appropriate? Yes   Fluid consistency: Thin      07/25/20 0840          DVT prophylaxis:      Code Status: Full Code  Family Communication: no family at bedside   Status is: Inpatient  Remains inpatient appropriate because:Ongoing diagnostic testing needed not appropriate for outpatient work up   Dispo: The patient is from: Home              Anticipated d/c is to: Home              Anticipated d/c date is: > 3 days              Patient currently is not medically stable to d/c.  Consultants:  Cardiology  Thoracic surgery   Procedures:  Cardiac cath 9/17  Left Heart Catheterization 07/25/20:  RCA: Proximal RCA 80% stenosis, large vessel with mild disease in PL and PDA branches.  A secondary PL branch is subtotally occluded. LM: Distal 30-40% stenosis.  LAD: LAD diffusely diseased, proximal diffuse 80% followed by tandem 90% stenosis.  Large D1 with moderate diffuse disease and proximal and mid tandem 70 and 80% stenosis.  Has secondary branches and is tortuous. Cx  and RI: Co-dominant Cx with Ostial circumflex 99% involving a moderate sized ramus with ostial 80 to 90% and proximal 90% stenosis. LV: Normal LVEDP.  No pressure gradient across the aortic valve. Patent LIMA and RIMA and RIMA has  ostial 20-30% stenosis. Subclavian arteries widely patent.  Right radial diffusely disease by US. 65mL contrast used.   Microbiology  None   Antimicrobials: None     Objective: Vitals:   07/25/20 1045 07/25/20 1053 07/25/20 1124 07/25/20 1210  BP:  114/71 129/80 (!) 142/75  Pulse: (!) 55 (!) 54 77 71  Resp:   18   Temp:   98.1 F (36.7 C)   TempSrc:   Oral   SpO2:  100% 99% 99%  Weight:      Height:        Intake/Output Summary (Last 24 hours) at 07/25/2020 1317 Last data filed at 07/25/2020 0936 Gross per 24 hour  Intake 486 ml  Output 200 ml  Net 286 ml   Filed Weights   07/24/20 0100 07/24/20 1729 07/25/20 0315  Weight: 113.4 kg 117.5 kg 117.7 kg    Examination:  Constitutional: NAD Eyes: no scleral icterus ENMT: Mucous membranes are moist.  Neck: normal, supple Respiratory: clear to auscultation bilaterally, no wheezing, no crackles. Normal respiratory effort. No accessory muscle use.  Cardiovascular: irregular Abdomen: non distended, no tenderness. Bowel sounds positive.  Musculoskeletal: no clubbing / cyanosis.  Skin: no rashes Neurologic: CN 2-12 grossly intact. Strength 5/5 in all 4.    Data Reviewed: I have independently reviewed following labs and imaging studies   CBC: Recent Labs  Lab 07/23/20 1221 07/24/20 0748 07/24/20 1122 07/25/20 0422  WBC 7.0 7.3 8.2 6.2  NEUTROABS  --   --  5.5  --   HGB 13.0 12.0* 12.8* 11.4*  HCT 41.2 38.8* 42.7 36.5*  MCV 90.9 90.4 94.5 92.4  PLT 325 303 295 285   Basic Metabolic Panel: Recent Labs  Lab 07/23/20 1221 07/24/20 0210 07/24/20 0748 07/24/20 1122 07/25/20 0422  NA 136 137 138  --  138  K 3.4* 3.5 3.5  --  3.8  CL 105 108 109  --  112*  CO2 21* 18* 17*  --  19*  GLUCOSE 137* 104* 107*  --  86  BUN 11 17 18   --  11  CREATININE 1.20 1.74* 1.45*  --  1.07  CALCIUM 8.6* 8.3* 8.4*  --  8.3*  MG  --  1.6*  --  1.7  --   PHOS  --  2.8  --  2.8  --    Liver Function Tests: Recent Labs  Lab  07/23/20 1221 07/24/20 0748  AST 48* 34  ALT 36 32  ALKPHOS 166* 163*  BILITOT 2.0* 1.9*  PROT 6.6 6.0*  ALBUMIN 2.9* 2.7*   Coagulation Profile: No results for input(s): INR, PROTIME in the last 168 hours. HbA1C: No results for input(s): HGBA1C in the last 72 hours. CBG: Recent Labs  Lab 07/23/20 1659  GLUCAP 117*    Recent Results (from the past 240 hour(s))  SARS Coronavirus 2 by RT PCR (hospital order, [facility]) Nasopharyngeal Nasopharyngeal Swab     Status: None   Collection Time: 07/23/20 11:46 PM   Specimen:  Nasopharyngeal Swab  Result Value Ref Range Status   SARS Coronavirus 2 NEGATIVE NEGATIVE Final    Comment: (NOTE) SARS-CoV-2 target nucleic acids are NOT DETECTED.  The SARS-CoV-2 RNA is generally detectable in upper and lower respiratory specimens during the acute phase of infection. The lowest concentration of SARS-CoV-2 viral copies this assay can detect is 250 copies / mL. A negative result does not preclude SARS-CoV-2 infection and should not be used as the sole basis for treatment or other patient management decisions.  A negative result may occur with improper specimen collection / handling, submission of specimen other than nasopharyngeal swab, presence of viral mutation(s) within the areas targeted by this assay, and inadequate number of viral copies (<250 copies / mL). A negative result must be combined with clinical observations, patient history, and epidemiological information.  Fact Sheet for Patients:   BoilerBrush.com.cy  Fact Sheet for Healthcare Providers: https://pope.com/  This test is not yet approved or  cleared by the [place] FDA and has been authorized for detection and/or diagnosis of SARS-CoV-2 by FDA under an Emergency Use Authorization (EUA).  This EUA will remain in effect (meaning this test can be used) for the duration of the COVID-19  declaration under Section 564(b)(1) of the Act, 21 U.S.C. section 360bbb-3(b)(1), unless the authorization is terminated or revoked sooner.  Performed at [facility], [address] [place], [place] [address]   C Difficile Quick Screen w PCR reflex     Status: None   Collection Time: 07/24/20 12:42 AM   Specimen: STOOL  Result Value Ref Range Status   C Diff antigen NEGATIVE NEGATIVE Final   C Diff toxin NEGATIVE NEGATIVE Final   C Diff interpretation No C. difficile detected.  Final    Comment: Performed at [facility], [address], [place], [place] [address]  Gastrointestinal Panel by PCR , Stool     Status: None   Collection Time: 07/24/20 12:42 AM   Specimen: Stool  Result Value Ref Range Status   Campylobacter species NOT DETECTED NOT DETECTED Final   Plesimonas shigelloides NOT DETECTED NOT DETECTED Final   Salmonella species NOT DETECTED NOT DETECTED Final   Yersinia enterocolitica NOT DETECTED NOT DETECTED Final   Vibrio species NOT DETECTED NOT DETECTED Final   Vibrio cholerae NOT DETECTED NOT DETECTED Final   Enteroaggregative E coli (EAEC) NOT DETECTED NOT DETECTED Final   Enteropathogenic E coli (EPEC) NOT DETECTED NOT DETECTED Final   Enterotoxigenic E coli (ETEC) NOT DETECTED NOT DETECTED Final   Shiga like toxin producing E coli (STEC) NOT DETECTED NOT DETECTED Final   Shigella/Enteroinvasive E coli (EIEC) NOT DETECTED NOT DETECTED Final   Cryptosporidium NOT DETECTED NOT DETECTED Final   Cyclospora cayetanensis NOT DETECTED NOT DETECTED Final   Entamoeba histolytica NOT DETECTED NOT DETECTED Final   Giardia lamblia NOT DETECTED NOT DETECTED Final   Adenovirus F40/41 NOT DETECTED NOT DETECTED Final   Astrovirus NOT DETECTED NOT DETECTED Final   Norovirus GI/GII NOT DETECTED NOT DETECTED Final   Rotavirus A NOT DETECTED NOT DETECTED Final   Sapovirus (I, II, IV, and V) NOT DETECTED NOT DETECTED Final    Comment: Performed at [facility], [address] [place], [place] [address]     Radiology Studies: CARDIAC CATHETERIZATION  Result Date: 07/25/2020  LV end diastolic pressure is normal.  Ramus lesion is 95% stenosed.  Ost RCA to Prox RCA lesion is 80% stenosed.  Ost Cx to Prox  Cx lesion is 95% stenosed.  Mid LM to Dist LM lesion is 40% stenosed.  Mid LAD-1 lesion is 80% stenosed.  Mid LAD-2 lesion is 90% stenosed.  1st Diag lesion is 80% stenosed.  Left Heart Catheterization 07/25/20: RCA: Proximal RCA 80% stenosis, large vessel with mild disease in PL and PDA branches.  A secondary PL branch is subtotally occluded. LM: Distal 30-40% stenosis. LAD: LAD diffusely diseased, proximal diffuse 80% followed by tandem 90% stenosis.  Large D1 with moderate diffuse disease and proximal and mid tandem 70 and 80% stenosis.  Has secondary branches and is tortuous. Cx  and RI: Co-dominant Cx with Ostial circumflex 99% involving a moderate sized ramus with ostial 80 to 90% and proximal 90% stenosis. LV: Normal LVEDP.  No pressure gradient across the aortic valve. Patent LIMA and RIMA and RIMA has ostial 20-30% stenosis. Subclavian arteries widely patent. Right radial diffusely disease by [place]. 11mL contrast used. Recommendation: Patient needs evaluation for inpatient CABG in view of NSVT, ventricular standstill.  Radial arterial conduits cannot be utilized as there was significant amount of atherosclerosis evident with ultrasound guidance.  Will discuss with surgery.  We could still consider Maze procedure and left atrial appendage ligation.   Pamella Pert, MD, PhD [facility]  Between 7 am - 7 pm I am available, please contact me via Amion or Securechat  Between 7 pm - 7 am I am not available, please contact night coverage MD/APP via Amion

## 2020-07-25 NOTE — Progress Notes (Signed)
I spoke with Dr. Nadara Eaton and he d/c'd the Metoprolol. My other concern was that I did not see the patient before he went to the cath lab but since he has gotten back, he is very emotional and tearful due to the news from his cath. However, although he has no focal deficits, he seems to at times have difficulty processing things and seems to be a bit puzzled with details on his care. I didn't know if this was his baseline. When I spoke with Dr. Jacinto Halim, he stated that post cath, the patient asked Dr. Jacinto Halim the same things multiple times. Dr. Jacinto Halim also stated with the sedation he received, that could be contributing to the issue. He stated to increase his mobility. Will continue to monitor

## 2020-07-25 NOTE — Progress Notes (Signed)
ANTICOAGULATION CONSULT NOTE  Pharmacy Consult for heparin Indication: atrial fibrillation  No Known Allergies  Patient Measurements: Height: 6\' 1"  (185.4 cm) Weight: 117.7 kg (259 lb 6.4 oz) IBW/kg (Calculated) : 79.9 Heparin Dosing Weight: 105kg  Vital Signs: Temp: 98.5 F (36.9 C) (09/17 0315) Temp Source: Oral (09/17 0315) BP: 150/80 (09/17 0900) Pulse Rate: 80 (09/17 0900)  Labs: Recent Labs    07/23/20 1221 07/24/20 0210 07/24/20 0748 07/24/20 0748 07/24/20 1122 07/25/20 0422  HGB   < >  --  12.0*   < > 12.8* 11.4*  HCT   < >  --  38.8*  --  42.7 36.5*  PLT   < >  --  303  --  295 285  APTT  --   --   --   --  83* 105*  HEPARINUNFRC  --   --   --   --  1.08* 0.58  CREATININE  --  1.74* 1.45*  --   --  1.07  CKTOTAL  --  39*  --   --   --   --   TROPONINIHS  --  45*  --   --   --   --    < > = values in this interval not displayed.    Estimated Creatinine Clearance: 90 mL/min (by C-G formula based on SCr of 1.07 mg/dL).  Assessment: 69yo male c/o dizziness and diarrhea. He is on chronic xarelto but transitioned to heparin for afib. Last dose of xarelto is reported as 9/14  Follow up heparin level this morning prior to cath is at goal (0.58) and aptt at upper end of goal 105s. Xarelto appears to be clearing and levels appear to be correlating so will stop aptt checks. No bleeding noted and CBC is stable.   Cath early this morning shows multivessel disease, plans are to consult cvts for possible surgery. Will start heparin back this afternoon when TR band is removed.   Goal of Therapy:  Heparin level 0.3-0.7 units/ml Monitor platelets by anticoagulation protocol: Yes   Plan:  Restart heparin gtt at 1500 units/hr Daily aPTT, heparin level and CBC  10/14 PharmD., BCPS Clinical Pharmacist 07/25/2020 9:13 AM

## 2020-07-25 NOTE — Progress Notes (Signed)
Patient had a 2.5 second pause. He is anxious and has been since he has been back from cath lab with news of CVTS consult. He was given his metoprolol this am for morning meds. I paged Dr. Jacinto Halim to make him aware.

## 2020-07-26 LAB — COMPREHENSIVE METABOLIC PANEL
ALT: 21 U/L (ref 0–44)
AST: 22 U/L (ref 15–41)
Albumin: 2.5 g/dL — ABNORMAL LOW (ref 3.5–5.0)
Alkaline Phosphatase: 120 U/L (ref 38–126)
Anion gap: 7 (ref 5–15)
BUN: 6 mg/dL — ABNORMAL LOW (ref 8–23)
CO2: 19 mmol/L — ABNORMAL LOW (ref 22–32)
Calcium: 8.5 mg/dL — ABNORMAL LOW (ref 8.9–10.3)
Chloride: 111 mmol/L (ref 98–111)
Creatinine, Ser: 0.91 mg/dL (ref 0.61–1.24)
GFR calc Af Amer: >60 mL/min (ref >60)
GFR calc non Af Amer: >60 mL/min (ref >60)
Glucose, Bld: 96 mg/dL (ref 70–99)
Potassium: 3.8 mmol/L (ref 3.5–5.1)
Sodium: 137 mmol/L (ref 135–145)
Total Bilirubin: 1.7 mg/dL — ABNORMAL HIGH (ref 0.3–1.2)
Total Protein: 6 g/dL — ABNORMAL LOW (ref 6.5–8.1)

## 2020-07-26 LAB — HEPARIN LEVEL (UNFRACTIONATED): Heparin Unfractionated: 0.39 IU/mL (ref 0.30–0.70)

## 2020-07-26 LAB — CBC
HCT: 36.4 % — ABNORMAL LOW (ref 39.0–52.0)
Hemoglobin: 11.4 g/dL — ABNORMAL LOW (ref 13.0–17.0)
MCH: 28.3 pg (ref 26.0–34.0)
MCHC: 31.3 g/dL (ref 30.0–36.0)
MCV: 90.3 fL (ref 80.0–100.0)
Platelets: 267 10*3/uL (ref 150–400)
RBC: 4.03 MIL/uL — ABNORMAL LOW (ref 4.22–5.81)
RDW: 14.3 % (ref 11.5–15.5)
WBC: 7.9 10*3/uL (ref 4.0–10.5)
nRBC: 0 % (ref 0.0–0.2)

## 2020-07-26 LAB — HEMOGLOBIN A1C
Hgb A1c MFr Bld: 5.2 % (ref 4.8–5.6)
Mean Plasma Glucose: 102.54 mg/dL

## 2020-07-26 LAB — LIPID PANEL
Cholesterol: 78 mg/dL (ref 0–200)
HDL: 24 mg/dL — ABNORMAL LOW (ref >40)
LDL Cholesterol: 36 mg/dL (ref 0–99)
Total CHOL/HDL Ratio: 3.3 RATIO
Triglycerides: 92 mg/dL (ref <150)
VLDL: 18 mg/dL (ref 0–40)

## 2020-07-26 LAB — MAGNESIUM: Magnesium: 1.7 mg/dL (ref 1.7–2.4)

## 2020-07-26 MED ORDER — ROSUVASTATIN CALCIUM 20 MG PO TABS
40.0000 mg | ORAL_TABLET | Freq: Every day | ORAL | Status: DC
  Administered 2020-07-27 – 2020-08-05 (×9): 40 mg via ORAL
  Filled 2020-07-26 (×9): qty 2

## 2020-07-26 MED ORDER — INFLUENZA VAC A&B SA ADJ QUAD 0.5 ML IM PRSY
0.5000 mL | PREFILLED_SYRINGE | INTRAMUSCULAR | Status: DC
  Filled 2020-07-26: qty 0.5

## 2020-07-26 MED ORDER — MAGNESIUM SULFATE 2 GM/50ML IV SOLN
2.0000 g | Freq: Once | INTRAVENOUS | Status: AC
  Administered 2020-07-26: 2 g via INTRAVENOUS
  Filled 2020-07-26: qty 50

## 2020-07-26 NOTE — Progress Notes (Signed)
Patient has had several pauses while sleeping as evidenced by snoring, longest 5.08 seconds.  Patient is easily arousable and asymptomatic of pauses.  2L nasal cannula applied.  [facility] paged and Dr. Loney Loh updated.  No new orders at this time.

## 2020-07-26 NOTE — Progress Notes (Signed)
Subjective:  Feels well, no chest pain, no specific complaints.  Intake/Output from previous day:  I/O last 3 completed shifts: In: 688.3 [P.O.:480; I.V.:208.3] Out: 550 [Urine:550] Total I/O In: -  Out: 200 [Urine:200]  Blood pressure (!) 123/55, pulse 64, temperature 97.7 F (36.5 C), temperature source Oral, resp. rate 18, height _0  (1.854 m), weight 118.7 kg, SpO2 96 %.   Vitals with BMI 07/26/2020 07/26/2020 07/26/2020  Height - - -  Weight - - 261 lbs 11 oz  BMI - - 10.62  Systolic 694 854 -  Diastolic 55 96 -  Pulse 64 - -    Physical Exam Constitutional:      Appearance: He is obese.  Cardiovascular:     Rate and Rhythm: Rhythm irregularly irregular.     Pulses: Intact distal pulses.          Dorsalis pedis pulses are 0 on the right side and 0 on the left side.       Posterior tibial pulses are 0 on the right side and 0 on the left side.     Heart sounds: No murmur heard.  No gallop. No S3 or S4 sounds.      Comments: Distant heart sounds. S1 is variable, S2 is normal. No JVD, No leg edema. Pulmonary:     Effort: Pulmonary effort is normal.     Breath sounds: Normal breath sounds.  Abdominal:     General: Bowel sounds are normal.     Palpations: Abdomen is soft.     Lab Results: BMP BNP (last 3 results) Recent Labs    07/24/20 0748  BNP 206.5*    ProBNP (last 3 results) No results for input(s): PROBNP in the last 8760 hours. BMP Latest Ref Rng & Units 07/26/2020 07/25/2020 07/24/2020  Glucose 70 - 99 mg/dL 96 86 107(H)  BUN 8 - 23 mg/dL 6(L) 11 18  Creatinine 0.61 - 1.24 mg/dL 0.91 1.07 1.45(H)  BUN/Creat Ratio 10 - 24 - - -  Sodium 135 - 145 mmol/L 137 138 138  Potassium 3.5 - 5.1 mmol/L 3.8 3.8 3.5  Chloride 98 - 111 mmol/L 111 112(H) 109  CO2 22 - 32 mmol/L 19(L) 19(L) 17(L)  Calcium 8.9 - 10.3 mg/dL 8.5(L) 8.3(L) 8.4(L)   Hepatic Function Latest Ref Rng & Units 07/26/2020 07/24/2020 07/23/2020  Total Protein 6.5 - 8.1 g/dL 6.0(L) 6.0(L) 6.6    Albumin 3.5 - 5.0 g/dL 2.5(L) 2.7(L) 2.9(L)  AST 15 - 41 U/L 22 34 48(H)  ALT 0 - 44 U/L 21 32 36  Alk Phosphatase 38 - 126 U/L 120 163(H) 166(H)  Total Bilirubin 0.3 - 1.2 mg/dL 1.7(H) 1.9(H) 2.0(H)   CBC Latest Ref Rng & Units 07/26/2020 07/25/2020 07/24/2020  WBC 4.0 - 10.5 K/uL 7.9 6.2 8.2  Hemoglobin 13.0 - 17.0 g/dL 11.4(L) 11.4(L) 12.8(L)  Hematocrit 39 - 52 % 36.4(L) 36.5(L) 42.7  Platelets 150 - 400 K/uL 267 285 295   Lipid Panel Recent Labs    07/26/20 0515  CHOL 78  TRIG 92  LDLCALC 36  VLDL 18  HDL 24*  CHOLHDL 3.3    HEMOGLOBIN A1C Lab Results  Component Value Date   HGBA1C 5.2 07/26/2020   MPG 102.54 07/26/2020   TSH Recent Labs    07/02/20 1604 07/24/20 0748  TSH 1.898 0.941   Imaging: Cardiac Studies:  Left Heart Catheterization 07/25/20:  RCA: Proximal RCA 80% stenosis, large vessel with mild disease in PL and PDA branches.  A secondary PL branch is subtotally occluded. LM: Distal 30-40% stenosis. LAD: LAD diffusely diseased, proximal diffuse 80% followed by tandem 90% stenosis.  Large D1 with moderate diffuse disease and proximal and mid tandem 70 and 80% stenosis.  Has secondary branches and is tortuous. Cx  and RI: Co-dominant Cx with Ostial circumflex 99% involving a moderate sized ramus with ostial 80 to 90% and proximal 90% stenosis. LV: Normal LVEDP.  No pressure gradient across the aortic valve. Patent LIMA and RIMA and RIMA has ostial 20-30% stenosis. Subclavian arteries widely patent.  Right radial diffusely disease by [place]. 73m contrast used.   Recommendation: Patient needs evaluation for inpatient CABG in view of NSVT, ventricular standstill.  Radial arterial conduits cannot be utilized as there was significant amount of atherosclerosis evident with ultrasound guidance.  Will discuss with surgery.  We could still consider Maze procedure and left atrial appendage ligation.  Echocardiogram 07/24/2020:  1. Poor acoustic windows limt study.  Overall LVEF is normal ENdocardium is difficult to see, especially at apex. Consider limited echo with Definity to confirm wall motion. . Left ventricular ejection fraction, by estimation, is 55 to 60%. The left  ventricle has normal function. Left ventricular endocardial border not optimally defined to evaluate regional wall motion. There is severe asymmetric left ventricular hypertrophy. Left ventricular diastolic parameters are indeterminate.  2. Right ventricular systolic function is normal. The right ventricular size is mildly enlarged.  3. The mitral valve is abnormal. Mild mitral valve regurgitation.  4. The aortic valve is abnormal. Aortic valve regurgitation is not visualized. Mild aortic valve sclerosis is present, with no evidence of aortic valve stenosis.  5. The inferior vena cava is normal in size with greater than 50% respiratory variability, suggesting right atrial pressure of 3 mmHg.  EKG 07/23/2020: Atrial fibrillation with controlled ventricular response, underlying left bundle branch block.  No further analysis.   Scheduled Meds:  aspirin EC  81 mg Oral Daily   [START ON 07/27/2020] influenza vaccine adjuvanted  0.5 mL Intramuscular Tomorrow-1000   psyllium  1 packet Oral Daily   rosuvastatin  10 mg Oral Daily   sodium chloride flush  3 mL Intravenous Q12H   sodium chloride flush  3 mL Intravenous Q12H   sodium chloride flush  3 mL Intravenous Q12H   Continuous Infusions:  sodium chloride     heparin 1,500 Units/hr (07/26/20 0600)   PRN Meds:.sodium chloride, acetaminophen **OR** acetaminophen, diphenhydrAMINE, HYDROcodone-acetaminophen, loperamide, ondansetron **OR** ondansetron (ZOFRAN) IV, sodium chloride flush  Assessment/Plan:  1.  Multivessel coronary artery disease, also involves codominant circumflex coronary artery.  Awaiting CABG and maze procedure for permanent atrial fibrillation in the hopes of converting him back to sinus rhythm. 2.  Permanent  atrial fibrillation 3.  Left bundle branch block 4.  AV nodal disease, cannot exclude ischemia contributing to the AV nodal block and ventricular standstill.  Patient remains asymptomatic.  Recommendation: Patient with multivessel coronary disease and awaiting CABG and also left atrial appendage ligation and possibly Maze procedure hoping to convert back to sinus rhythm to improve diastolic function.  Continue IV heparin, Xarelto has been on hold, continue aspirin, will change Crestor from 10 mg to 40 mg daily.  He is not on a beta-blocker in view of high degree AV block.  Will change orders not to page unless he has symptomatic ventricular pauses of >6 seconds.   JAdrian Prows MD, FF[facility]9/18/2021, 2:24 PM Office: [phone]

## 2020-07-26 NOTE — Progress Notes (Addendum)
Patient had 4.39 second pause at 1329 per at CMT.  Patient sleeping at this time. Woke patient upon entering room he was asymptomatic. Dr. Elvera Lennox notified. No new orders received.

## 2020-07-26 NOTE — Progress Notes (Signed)
ANTICOAGULATION CONSULT NOTE  Pharmacy Consult for heparin Indication: atrial fibrillation  No Known Allergies  Patient Measurements: Height: 6\' 1"  (185.4 cm) Weight: 118.7 kg (261 lb 11.2 oz) IBW/kg (Calculated) : 79.9 Heparin Dosing Weight: 105kg  Vital Signs: Temp: 98.2 F (36.8 C) (09/18 0503) Temp Source: Oral (09/18 0503) BP: 132/72 (09/18 0503) Pulse Rate: 68 (09/18 0503)  Labs: Recent Labs    07/23/20 1221 07/24/20 0210 07/24/20 0748 07/24/20 0748 07/24/20 1122 07/24/20 1122 07/25/20 0422 07/26/20 0515  HGB   < >  --  12.0*   < > 12.8*   < > 11.4* 11.4*  HCT   < >  --  38.8*   < > 42.7  --  36.5* 36.4*  PLT   < >  --  303   < > 295  --  285 267  APTT  --   --   --   --  83*  --  105*  --   HEPARINUNFRC  --   --   --   --  1.08*  --  0.58 0.39  CREATININE   < > 1.74* 1.45*  --   --   --  1.07 0.91  CKTOTAL  --  39*  --   --   --   --   --   --   TROPONINIHS  --  45*  --   --   --   --   --   --    < > = values in this interval not displayed.    Estimated Creatinine Clearance: 106.3 mL/min (by C-G formula based on SCr of 0.91 mg/dL).  Assessment: 69yo male c/o dizziness and diarrhea. He is on chronic xarelto but transitioned to heparin for afib. Last dose of xarelto is reported as 9/14.  Heparin level and aptt correlated on 9/17, only checking heparin levels starting 9/18.  IV heparin restarted after cath yesterday 9/17 at 1400. Heparin level this morning is at goal (0.39). No bleeding noted and CBC is stable.   Cath on 9/17 showed multivessel disease, plans are to consult cvts for possible surgery.  Goal of Therapy:  Heparin level 0.3-0.7 units/ml Monitor platelets by anticoagulation protocol: Yes   Plan:  Continue heparin 1500 units/hr Daily heparin level and CBC  10/17, PharmD PGY-1 Pharmacy Resident 07/26/2020 8:13 AM Please see AMION for all pharmacy numbers

## 2020-07-26 NOTE — Progress Notes (Signed)
PROGRESS NOTE  [name] Rice LAG:[id number] DOB: [birth year] DOA: 07/23/2020 PCP: [name], PA-C   LOS: 2 days   Brief Narrative / Interim history: 69 year old male with history of A. fib on Xarelto, left bundle branch block, chronic systolic CHF with an EF of 45% back in February 2020, comes to the hospital with lightheadedness.  He was evaluated as an outpatient by his cardiologist, and due to concern for arrhythmias a Holter monitor was placed.  His metoprolol was also discontinued.  He was admitted to the hospital, cardiology was consulted and he underwent a cardiac cath on 9/16 with recommendations for CABG  Subjective / 24h Interval events: Slept better last night, tells me he was awoken several times due to telemetry showing pauses and nurse checking on him.  Denies any chest pain  Assessment & Plan: Principal Problem Coronary artery disease -given arrhythmia and risk factors as above underwent on 9/17 which showed significant multivessel disease.  Cardiothoracic surgery consulted, awaiting formal consultation but looks like his pre-CABG evaluation is in place and he is scheduled for next Tuesday  Active Problems Lightheadedness/presyncope due to bradycardia, frequent pauses-appreciate cardiology consultation.  The 14-day extended Holter monitor had significant events with heart rate varying 25-197 bpm's, several pauses more than 3 seconds as well as couple of pauses of around 9 seconds.  There are also reported NSVT.  -He continues to have pauses 4-5 seconds here as well, asymptomatic, not associated with hypotension -Hold all AV nodal blocking agents, continue to monitor  Chronic atrial fibrillation - Xarelto has been held he was started on IV heparin continue to hold beta-blockers.  Essential hypertension with chronic kidney disease stage IIIa -Holding all antihypertensive medication.  Acute kidney injury on chronic kidney disease stage IIIa -creatinine as high as 1.7.   Creatinine has currently normalized  Hypercholesterolemia -Continue statins.  Hypokalemia -Replete orally.  Diarrhea -Of unclear etiology C. difficile PCR is negative. Started on Metamucil and Imodium.  Scheduled Meds: . aspirin EC  81 mg Oral Daily  . [START ON 07/27/2020] influenza vaccine adjuvanted  0.5 mL Intramuscular Tomorrow-1000  . psyllium  1 packet Oral Daily  . rosuvastatin  10 mg Oral Daily  . sodium chloride flush  3 mL Intravenous Q12H  . sodium chloride flush  3 mL Intravenous Q12H  . sodium chloride flush  3 mL Intravenous Q12H   Continuous Infusions: . sodium chloride    . heparin 1,500 Units/hr (07/26/20 0600)  . magnesium sulfate bolus IVPB 2 g (07/26/20 1016)   PRN Meds:.sodium chloride, acetaminophen **OR** acetaminophen, diphenhydrAMINE, HYDROcodone-acetaminophen, loperamide, ondansetron **OR** ondansetron (ZOFRAN) IV, sodium chloride flush  Diet Orders (From admission, onward)    Start     Ordered   07/25/20 0841  Diet Heart Room service appropriate? Yes; Fluid consistency: Thin  Diet effective now       Question Answer Comment  Room service appropriate? Yes   Fluid consistency: Thin      07/25/20 0840          DVT prophylaxis:      Code Status: Full Code  Family Communication: no family at bedside   Status is: Inpatient  Remains inpatient appropriate because:Ongoing diagnostic testing needed not appropriate for outpatient work up   Dispo: The patient is from: Home              Anticipated d/c is to: Home              Anticipated d/c date is: >  3 days              Patient currently is not medically stable to d/c.  Consultants:  Cardiology  Thoracic surgery   Procedures:  Cardiac cath 9/17  Left Heart Catheterization 07/25/20:  RCA: Proximal RCA 80% stenosis, large vessel with mild disease in PL and PDA branches.  A secondary PL branch is subtotally occluded. LM: Distal 30-40% stenosis. LAD: LAD diffusely diseased, proximal  diffuse 80% followed by tandem 90% stenosis.  Large D1 with moderate diffuse disease and proximal and mid tandem 70 and 80% stenosis.  Has secondary branches and is tortuous. Cx  and RI: Co-dominant Cx with Ostial circumflex 99% involving a moderate sized ramus with ostial 80 to 90% and proximal 90% stenosis. LV: Normal LVEDP.  No pressure gradient across the aortic valve. Patent LIMA and RIMA and RIMA has ostial 20-30% stenosis. Subclavian arteries widely patent.  Right radial diffusely disease by US. 65mL contrast used.   Microbiology  None   Antimicrobials: None     Objective: Vitals:   07/26/20 0026 07/26/20 0503 07/26/20 0510 07/26/20 0946  BP: 136/80 132/72  (!) 116/96  Pulse: 60 68    Resp:  20    Temp:  98.2 F (36.8 C)    TempSrc:  Oral    SpO2:  99%    Weight:   118.7 kg   Height:        Intake/Output Summary (Last 24 hours) at 07/26/2020 1116 Last data filed at 07/26/2020 0500 Gross per 24 hour  Intake 202.26 ml  Output 350 ml  Net -147.74 ml   Filed Weights   07/24/20 1729 07/25/20 0315 07/26/20 0510  Weight: 117.5 kg 117.7 kg 118.7 kg    Examination:  Constitutional: No distress, in bed Eyes: No scleral icterus ENMT: Moist mucous membranes Neck: normal, supple Respiratory: Clear bilaterally, no wheezing or crackles heard, normal respiratory effort Cardiovascular: Irregular, no significant murmurs Abdomen: Soft, nontender, nondistended, bowel sounds positive Musculoskeletal: no clubbing / cyanosis.  Skin: No rashes appreciated Neurologic: No focal deficits   Data Reviewed: I have independently reviewed following labs and imaging studies   CBC: Recent Labs  Lab 07/23/20 1221 07/24/20 0748 07/24/20 1122 07/25/20 0422 07/26/20 0515  WBC 7.0 7.3 8.2 6.2 7.9  NEUTROABS  --   --  5.5  --   --   HGB 13.0 12.0* 12.8* 11.4* 11.4*  HCT 41.2 38.8* 42.7 36.5* 36.4*  MCV 90.9 90.4 94.5 92.4 90.3  PLT 325 303 295 285 267   Basic Metabolic  Panel: Recent Labs  Lab 07/23/20 1221 07/24/20 0210 07/24/20 0748 07/24/20 1122 07/25/20 0422 07/26/20 0515  NA 136 137 138  --  138 137  K 3.4* 3.5 3.5  --  3.8 3.8  CL 105 108 109  --  112* 111  CO2 21* 18* 17*  --  19* 19*  GLUCOSE 137* 104* 107*  --  86 96  BUN 11 17 18   --  11 6*  CREATININE 1.20 1.74* 1.45*  --  1.07 0.91  CALCIUM 8.6* 8.3* 8.4*  --  8.3* 8.5*  MG  --  1.6*  --  1.7  --  1.7  PHOS  --  2.8  --  2.8  --   --    Liver Function Tests: Recent Labs  Lab 07/23/20 1221 07/24/20 0748 07/26/20 0515  AST 48* 34 22  ALT 36 32 21  ALKPHOS 166* 163* 120  BILITOT 2.0* 1.9*  1.7*  PROT 6.6 6.0* 6.0*  ALBUMIN 2.9* 2.7* 2.5*   Coagulation Profile: No results for input(s): INR, PROTIME in the last 168 hours. HbA1C: Recent Labs    07/26/20 0515  HGBA1C 5.2   CBG: Recent Labs  Lab 07/23/20 1659  GLUCAP 117*    Recent Results (from the past 240 hour(s))  SARS Coronavirus 2 by RT PCR (hospital order, performed in [facility] hospital lab) Nasopharyngeal Nasopharyngeal Swab     Status: None   Collection Time: 07/23/20 11:46 PM   Specimen: Nasopharyngeal Swab  Result Value Ref Range Status   SARS Coronavirus 2 NEGATIVE NEGATIVE Final    Comment: (NOTE) SARS-CoV-2 target nucleic acids are NOT DETECTED.  The SARS-CoV-2 RNA is generally detectable in upper and lower respiratory specimens during the acute phase of infection. The lowest concentration of SARS-CoV-2 viral copies this assay can detect is 250 copies / mL. A negative result does not preclude SARS-CoV-2 infection and should not be used as the sole basis for treatment or other patient management decisions.  A negative result may occur with improper specimen collection / handling, submission of specimen other than nasopharyngeal swab, presence of viral mutation(s) within the areas targeted by this assay, and inadequate number of viral copies (<250 copies / mL). A negative result must be combined  with clinical observations, patient history, and epidemiological information.  Fact Sheet for Patients:   BoilerBrush.com.cy  Fact Sheet for Healthcare Providers: https://pope.com/  This test is not yet approved or  cleared by the [place] FDA and has been authorized for detection and/or diagnosis of SARS-CoV-2 by FDA under an Emergency Use Authorization (EUA).  This EUA will remain in effect (meaning this test can be used) for the duration of the COVID-19 declaration under Section 564(b)(1) of the Act, 21 U.S.C. section 360bbb-3(b)(1), unless the authorization is terminated or revoked sooner.  Performed at [facility], [address] [place], [place] [address]   C Difficile Quick Screen w PCR reflex     Status: None   Collection Time: 07/24/20 12:42 AM   Specimen: STOOL  Result Value Ref Range Status   C Diff antigen NEGATIVE NEGATIVE Final   C Diff toxin NEGATIVE NEGATIVE Final   C Diff interpretation No C. difficile detected.  Final    Comment: Performed at [facility], [address], [place], [place] [address]  Gastrointestinal Panel by PCR , Stool     Status: None   Collection Time: 07/24/20 12:42 AM   Specimen: Stool  Result Value Ref Range Status   Campylobacter species NOT DETECTED NOT DETECTED Final   Plesimonas shigelloides NOT DETECTED NOT DETECTED Final   Salmonella species NOT DETECTED NOT DETECTED Final   Yersinia enterocolitica NOT DETECTED NOT DETECTED Final   Vibrio species NOT DETECTED NOT DETECTED Final   Vibrio cholerae NOT DETECTED NOT DETECTED Final   Enteroaggregative E coli (EAEC) NOT DETECTED NOT DETECTED Final   Enteropathogenic E coli (EPEC) NOT DETECTED NOT DETECTED Final   Enterotoxigenic E coli (ETEC) NOT DETECTED NOT DETECTED Final   Shiga like toxin producing E coli (STEC) NOT DETECTED NOT DETECTED Final   Shigella/Enteroinvasive E coli (EIEC) NOT DETECTED NOT DETECTED Final    Cryptosporidium NOT DETECTED NOT DETECTED Final   Cyclospora cayetanensis NOT DETECTED NOT DETECTED Final   Entamoeba histolytica NOT DETECTED NOT DETECTED Final   Giardia lamblia NOT DETECTED NOT DETECTED Final   Adenovirus F40/41 NOT DETECTED NOT DETECTED Final  Astrovirus NOT DETECTED NOT DETECTED Final   Norovirus GI/GII NOT DETECTED NOT DETECTED Final   Rotavirus A NOT DETECTED NOT DETECTED Final   Sapovirus (I, II, IV, and V) NOT DETECTED NOT DETECTED Final    Comment: Performed at [facility], [address], [place], [place] [address]     Radiology Studies: No results found. Pamella Pert, MD, PhD [facility]  Between 7 am - 7 pm I am available, please contact me via Amion or Securechat  Between 7 pm - 7 am I am not available, please contact night coverage MD/APP via Amion

## 2020-07-26 NOTE — Progress Notes (Signed)
Has had several pauses while sleeping. On Heparin drip for a fib. No chest pain or shortness of breath. CABG, MAZE, LA clip scheduled for Tuesday with Dr. Vickey Sages.

## 2020-07-27 ENCOUNTER — Inpatient Hospital Stay (HOSPITAL_COMMUNITY): Payer: Medicare Other

## 2020-07-27 DIAGNOSIS — Z01810 Encounter for preprocedural cardiovascular examination: Secondary | ICD-10-CM

## 2020-07-27 LAB — CBC
HCT: 35.5 % — ABNORMAL LOW (ref 39.0–52.0)
Hemoglobin: 11.3 g/dL — ABNORMAL LOW (ref 13.0–17.0)
MCH: 28.8 pg (ref 26.0–34.0)
MCHC: 31.8 g/dL (ref 30.0–36.0)
MCV: 90.6 fL (ref 80.0–100.0)
Platelets: 251 10*3/uL (ref 150–400)
RBC: 3.92 MIL/uL — ABNORMAL LOW (ref 4.22–5.81)
RDW: 14.5 % (ref 11.5–15.5)
WBC: 7 10*3/uL (ref 4.0–10.5)
nRBC: 0 % (ref 0.0–0.2)

## 2020-07-27 LAB — COMPREHENSIVE METABOLIC PANEL
ALT: 21 U/L (ref 0–44)
AST: 21 U/L (ref 15–41)
Albumin: 2.4 g/dL — ABNORMAL LOW (ref 3.5–5.0)
Alkaline Phosphatase: 105 U/L (ref 38–126)
Anion gap: 9 (ref 5–15)
BUN: 6 mg/dL — ABNORMAL LOW (ref 8–23)
CO2: 20 mmol/L — ABNORMAL LOW (ref 22–32)
Calcium: 8.3 mg/dL — ABNORMAL LOW (ref 8.9–10.3)
Chloride: 108 mmol/L (ref 98–111)
Creatinine, Ser: 1.05 mg/dL (ref 0.61–1.24)
GFR calc Af Amer: >60 mL/min (ref >60)
GFR calc non Af Amer: >60 mL/min (ref >60)
Glucose, Bld: 96 mg/dL (ref 70–99)
Potassium: 3.3 mmol/L — ABNORMAL LOW (ref 3.5–5.1)
Sodium: 137 mmol/L (ref 135–145)
Total Bilirubin: 1.4 mg/dL — ABNORMAL HIGH (ref 0.3–1.2)
Total Protein: 5.7 g/dL — ABNORMAL LOW (ref 6.5–8.1)

## 2020-07-27 LAB — HEPARIN LEVEL (UNFRACTIONATED)
Heparin Unfractionated: 0.23 IU/mL — ABNORMAL LOW (ref 0.30–0.70)
Heparin Unfractionated: 0.18 IU/mL — ABNORMAL LOW (ref 0.30–0.70)

## 2020-07-27 MED ORDER — POTASSIUM CHLORIDE CRYS ER 20 MEQ PO TBCR
40.0000 meq | EXTENDED_RELEASE_TABLET | Freq: Once | ORAL | Status: AC
  Administered 2020-07-27: 40 meq via ORAL
  Filled 2020-07-27: qty 2

## 2020-07-27 NOTE — Progress Notes (Addendum)
ANTICOAGULATION CONSULT NOTE  Pharmacy Consult for heparin Indication: atrial fibrillation  No Known Allergies  Patient Measurements: Height: 6\' 1"  (185.4 cm) Weight: 117.8 kg (259 lb 9.6 oz) IBW/kg (Calculated) : 79.9 Heparin Dosing Weight: 105kg  Vital Signs: Temp: 97.5 F (36.4 C) (09/19 1255) Temp Source: Oral (09/19 1255) BP: 140/69 (09/19 1255) Pulse Rate: 72 (09/19 1255)  Labs: Recent Labs    07/25/20 0422 07/25/20 0422 07/26/20 0515 07/27/20 0201 07/27/20 1517  HGB 11.4*   < > 11.4* 11.3*  --   HCT 36.5*  --  36.4* 35.5*  --   PLT 285  --  267 251  --   APTT 105*  --   --   --   --   HEPARINUNFRC 0.58   < > 0.39 0.23* 0.18*  CREATININE 1.07  --  0.91 1.05  --    < > = values in this interval not displayed.    Estimated Creatinine Clearance: 91.8 mL/min (by C-G formula based on SCr of 1.05 mg/dL).  Assessment: 69yo male c/o dizziness and diarrhea. He is on chronic xarelto but transitioned to heparin for afib. Last dose of xarelto is reported as 9/14.  Heparin level and aptt correlated on 9/17, only checking heparin levels starting 9/18.  Heparin level is subtherapeutic at 0.18 on 1600 units/hr. No bleeding or problems with infusion noted per RN. CBC is stable.    Goal of Therapy:  Heparin level 0.3-0.7 units/ml Monitor platelets by anticoagulation protocol: Yes   Plan:  Increase heparin drip to 1900 units/hr Daily heparin level and CBC Monitor for s/sx of bleeding CABG, MAZE, LA clip scheduled for 9/21  Thank you for involving pharmacy in this patient's care.  10/21, PharmD, BCPS Clinical Pharmacist 07/27/2020 4:42 PM  **Pharmacist phone directory can be found on [email or web address] listed under [facility]**

## 2020-07-27 NOTE — Progress Notes (Signed)
Patient eating breakfast this am. Patient is in "good spirits" this am. He denies chest pain or shortness of breath. CABG, MAZE, LA clip scheduled for Tuesday 09/21 with Dr. Vickey Sages.

## 2020-07-27 NOTE — Progress Notes (Signed)
Pre-CABG testing has been completed. Preliminary results can be found in CV Proc through chart review.   07/27/20 12:05 PM Olen Cordial RVT

## 2020-07-27 NOTE — Progress Notes (Signed)
ANTICOAGULATION CONSULT NOTE  Pharmacy Consult for heparin Indication: atrial fibrillation  Assessment: 69yo male c/o dizziness and diarrhea. He is on chronic xarelto but transitioned to heparin for afib. Last dose of xarelto is reported as 9/14.  Heparin level this morning 0.23 units/ml.  No bleeding reported  Hg 11.3, PTLC 251  Goal of Therapy:  Heparin level 0.3-0.7 units/ml Monitor platelets by anticoagulation protocol: Yes   Plan:  Increase heparin 1600 units/hr Check heparin level later today  Thanks for allowing pharmacy to be a part of this patient's care.  Talbert Cage, PharmD Clinical Pharmacist

## 2020-07-27 NOTE — Progress Notes (Signed)
PROGRESS NOTE  [name] Rice ZOX:[id number] DOB: [birth year] DOA: 07/23/2020 PCP: [name], PA-C   LOS: 3 days   Brief Narrative / Interim history: 69 year old male with history of A. fib on Xarelto, left bundle branch block, chronic systolic CHF with an EF of 45% back in February 2020, comes to the hospital with lightheadedness.  He was evaluated as an outpatient by his cardiologist, and due to concern for arrhythmias a Holter monitor was placed.  His metoprolol was also discontinued.  He was admitted to the hospital, cardiology was consulted and he underwent a cardiac cath on 9/16 with recommendations for CABG  Subjective / 24h Interval events: Finally got a good night sleep.  Denies chest pain this morning, denies any shortness of breath.  No abdominal pain, no nausea or vomiting.  Assessment & Plan: Principal Problem Coronary artery disease -given arrhythmia and risk factors as above underwent on 9/17 which showed significant multivessel disease.  Cardiothoracic surgery consulted, plans in place for CABG on Tuesday morning  Active Problems Lightheadedness/presyncope due to bradycardia, frequent pauses-appreciate cardiology consultation.  The 14-day extended Holter monitor had significant events with heart rate varying 25-197 bpm's, several pauses more than 3 seconds as well as couple of pauses of around 9 seconds.  There are also reported NSVT.  -He continues to have pauses 4-5 seconds here as well, asymptomatic, not associated with hypotension -Hold all AV nodal blocking agents, continue to monitor  Chronic atrial fibrillation - Xarelto has been held he was started on IV heparin continue to hold beta-blockers.  He will have Maze procedure as well as an LAA clip during surgery on Tuesday  Essential hypertension with chronic kidney disease stage IIIa -Holding all antihypertensive medication.  Acute kidney injury on chronic kidney disease stage IIIa -creatinine as high as 1.7.   Creatinine has currently normalized  Hypercholesterolemia -Continue statins.  Hypokalemia -Replete orally.  Diarrhea -Of unclear etiology C. difficile PCR is negative. Started on Metamucil and Imodium.  Scheduled Meds: . aspirin EC  81 mg Oral Daily  . influenza vaccine adjuvanted  0.5 mL Intramuscular Tomorrow-1000  . psyllium  1 packet Oral Daily  . rosuvastatin  40 mg Oral Daily  . sodium chloride flush  3 mL Intravenous Q12H  . sodium chloride flush  3 mL Intravenous Q12H  . sodium chloride flush  3 mL Intravenous Q12H   Continuous Infusions: . sodium chloride    . heparin 1,600 Units/hr (07/27/20 0700)   PRN Meds:.sodium chloride, acetaminophen **OR** acetaminophen, diphenhydrAMINE, HYDROcodone-acetaminophen, loperamide, ondansetron **OR** ondansetron (ZOFRAN) IV, sodium chloride flush  Diet Orders (From admission, onward)    Start     Ordered   07/25/20 0841  Diet Heart Room service appropriate? Yes; Fluid consistency: Thin  Diet effective now       Question Answer Comment  Room service appropriate? Yes   Fluid consistency: Thin      07/25/20 0840          DVT prophylaxis:      Code Status: Full Code  Family Communication: no family at bedside   Status is: Inpatient  Remains inpatient appropriate because:Ongoing diagnostic testing needed not appropriate for outpatient work up   Dispo: The patient is from: Home              Anticipated d/c is to: Home              Anticipated d/c date is: > 3 days  Patient currently is not medically stable to d/c.  Consultants:  Cardiology  Thoracic surgery   Procedures:  Cardiac cath 9/17  Left Heart Catheterization 07/25/20:  RCA: Proximal RCA 80% stenosis, large vessel with mild disease in PL and PDA branches.  A secondary PL branch is subtotally occluded. LM: Distal 30-40% stenosis. LAD: LAD diffusely diseased, proximal diffuse 80% followed by tandem 90% stenosis.  Large D1 with moderate diffuse  disease and proximal and mid tandem 70 and 80% stenosis.  Has secondary branches and is tortuous. Cx  and RI: Co-dominant Cx with Ostial circumflex 99% involving a moderate sized ramus with ostial 80 to 90% and proximal 90% stenosis. LV: Normal LVEDP.  No pressure gradient across the aortic valve. Patent LIMA and RIMA and RIMA has ostial 20-30% stenosis. Subclavian arteries widely patent.  Right radial diffusely disease by US. 65mL contrast used.   Microbiology  None   Antimicrobials: None     Objective: Vitals:   07/26/20 0946 07/26/20 1253 07/26/20 2035 07/27/20 0557  BP: (!) 116/96 (!) 123/55 138/72 138/82  Pulse:  64 61 60  Resp:  18 (!) 21 20  Temp:  97.7 F (36.5 C) 97.8 F (36.6 C) 97.6 F (36.4 C)  TempSrc:  Oral Oral Oral  SpO2:  96% 100% 99%  Weight:    117.8 kg  Height:        Intake/Output Summary (Last 24 hours) at 07/27/2020 1247 Last data filed at 07/27/2020 0700 Gross per 24 hour  Intake 658.68 ml  Output 825 ml  Net -166.32 ml   Filed Weights   07/25/20 0315 07/26/20 0510 07/27/20 0557  Weight: 117.7 kg 118.7 kg 117.8 kg    Examination:  Constitutional: No distress, in bed Eyes: No scleral icterus ENMT: Moist mucous membranes Neck: normal, supple Respiratory: Clear to auscultation bilaterally without wheezing or crackles, normal respiratory effort Cardiovascular: Irregular, no significant murmurs Abdomen: Soft, nontender, nondistended, positive bowel sounds Musculoskeletal: no clubbing / cyanosis.  Skin: No rashes seen Neurologic: Nonfocal, equal strength, ambulatory   Data Reviewed: I have independently reviewed following labs and imaging studies   CBC: Recent Labs  Lab 07/24/20 0748 07/24/20 1122 07/25/20 0422 07/26/20 0515 07/27/20 0201  WBC 7.3 8.2 6.2 7.9 7.0  NEUTROABS  --  5.5  --   --   --   HGB 12.0* 12.8* 11.4* 11.4* 11.3*  HCT 38.8* 42.7 36.5* 36.4* 35.5*  MCV 90.4 94.5 92.4 90.3 90.6  PLT 303 295 285 267 251   Basic  Metabolic Panel: Recent Labs  Lab 07/24/20 0210 07/24/20 0748 07/24/20 1122 07/25/20 0422 07/26/20 0515 07/27/20 0201  NA 137 138  --  138 137 137  K 3.5 3.5  --  3.8 3.8 3.3*  CL 108 109  --  112* 111 108  CO2 18* 17*  --  19* 19* 20*  GLUCOSE 104* 107*  --  86 96 96  BUN 17 18  --  11 6* 6*  CREATININE 1.74* 1.45*  --  1.07 0.91 1.05  CALCIUM 8.3* 8.4*  --  8.3* 8.5* 8.3*  MG 1.6*  --  1.7  --  1.7  --   PHOS 2.8  --  2.8  --   --   --    Liver Function Tests: Recent Labs  Lab 07/23/20 1221 07/24/20 0748 07/26/20 0515 07/27/20 0201  AST 48* 34 22 21  ALT 36 32 21 21  ALKPHOS 166* 163* 120 105  BILITOT 2.0* 1.9*  1.7* 1.4*  PROT 6.6 6.0* 6.0* 5.7*  ALBUMIN 2.9* 2.7* 2.5* 2.4*   Coagulation Profile: No results for input(s): INR, PROTIME in the last 168 hours. HbA1C: Recent Labs    07/26/20 0515  HGBA1C 5.2   CBG: Recent Labs  Lab 07/23/20 1659  GLUCAP 117*    Recent Results (from the past 240 hour(s))  SARS Coronavirus 2 by RT PCR (hospital order, performed in [facility] hospital lab) Nasopharyngeal Nasopharyngeal Swab     Status: None   Collection Time: 07/23/20 11:46 PM   Specimen: Nasopharyngeal Swab  Result Value Ref Range Status   SARS Coronavirus 2 NEGATIVE NEGATIVE Final    Comment: (NOTE) SARS-CoV-2 target nucleic acids are NOT DETECTED.  The SARS-CoV-2 RNA is generally detectable in upper and lower respiratory specimens during the acute phase of infection. The lowest concentration of SARS-CoV-2 viral copies this assay can detect is 250 copies / mL. A negative result does not preclude SARS-CoV-2 infection and should not be used as the sole basis for treatment or other patient management decisions.  A negative result may occur with improper specimen collection / handling, submission of specimen other than nasopharyngeal swab, presence of viral mutation(s) within the areas targeted by this assay, and inadequate number of viral copies (<250  copies / mL). A negative result must be combined with clinical observations, patient history, and epidemiological information.  Fact Sheet for Patients:   BoilerBrush.com.cy  Fact Sheet for Healthcare Providers: https://pope.com/  This test is not yet approved or  cleared by the [place] FDA and has been authorized for detection and/or diagnosis of SARS-CoV-2 by FDA under an Emergency Use Authorization (EUA).  This EUA will remain in effect (meaning this test can be used) for the duration of the COVID-19 declaration under Section 564(b)(1) of the Act, 21 U.S.C. section 360bbb-3(b)(1), unless the authorization is terminated or revoked sooner.  Performed at [facility], [address], [place], [place] [address]   C Difficile Quick Screen w PCR reflex     Status: None   Collection Time: 07/24/20 12:42 AM   Specimen: STOOL  Result Value Ref Range Status   C Diff antigen NEGATIVE NEGATIVE Final   C Diff toxin NEGATIVE NEGATIVE Final   C Diff interpretation No C. difficile detected.  Final    Comment: Performed at [facility], [address] [place], [place] [address]  Gastrointestinal Panel by PCR , Stool     Status: None   Collection Time: 07/24/20 12:42 AM   Specimen: Stool  Result Value Ref Range Status   Campylobacter species NOT DETECTED NOT DETECTED Final   Plesimonas shigelloides NOT DETECTED NOT DETECTED Final   Salmonella species NOT DETECTED NOT DETECTED Final   Yersinia enterocolitica NOT DETECTED NOT DETECTED Final   Vibrio species NOT DETECTED NOT DETECTED Final   Vibrio cholerae NOT DETECTED NOT DETECTED Final   Enteroaggregative E coli (EAEC) NOT DETECTED NOT DETECTED Final   Enteropathogenic E coli (EPEC) NOT DETECTED NOT DETECTED Final   Enterotoxigenic E coli (ETEC) NOT DETECTED NOT DETECTED Final   Shiga like toxin producing E coli (STEC) NOT DETECTED NOT DETECTED Final    Shigella/Enteroinvasive E coli (EIEC) NOT DETECTED NOT DETECTED Final   Cryptosporidium NOT DETECTED NOT DETECTED Final   Cyclospora cayetanensis NOT DETECTED NOT DETECTED Final   Entamoeba histolytica NOT DETECTED NOT DETECTED Final   Giardia lamblia NOT DETECTED NOT DETECTED Final   Adenovirus F40/41 NOT DETECTED NOT DETECTED  Final   Astrovirus NOT DETECTED NOT DETECTED Final   Norovirus GI/GII NOT DETECTED NOT DETECTED Final   Rotavirus A NOT DETECTED NOT DETECTED Final   Sapovirus (I, II, IV, and V) NOT DETECTED NOT DETECTED Final    Comment: Performed at [facility], [address], [place], [place] [address]     Radiology Studies: VAS US DOPPLER PRE CABG  Result Date: 07/27/2020 PREOPERATIVE VASCULAR EVALUATION  Indications:      Pre-CABG. Risk Factors:     Hypertension. Comparison Study: No prior studies. Performing Technologist: Olen Cordial RVT  Examination Guidelines: A complete evaluation includes B-mode imaging, spectral Doppler, color Doppler, and power Doppler as needed of all accessible portions of each vessel. Bilateral testing is considered an integral part of a complete examination. Limited examinations for reoccurring indications may be performed as noted.  Right Carotid Findings: +----------+--------+--------+--------+-----------------------+--------+           PSV cm/sEDV cm/sStenosisDescribe               Comments +----------+--------+--------+--------+-----------------------+--------+ CCA Prox  143     15              smooth and heterogenous         +----------+--------+--------+--------+-----------------------+--------+ CCA Distal66      10              smooth and heterogenous         +----------+--------+--------+--------+-----------------------+--------+ ICA Prox  50      19              calcific                        +----------+--------+--------+--------+-----------------------+--------+ ICA Distal96      31                                      tortuous +----------+--------+--------+--------+-----------------------+--------+ ECA       128     15                                              +----------+--------+--------+--------+-----------------------+--------+ Portions of this table do not appear on this page. +----------+--------+-------+--------+------------+           PSV cm/sEDV cmsDescribeArm Pressure +----------+--------+-------+--------+------------+ Subclavian101                                 +----------+--------+-------+--------+------------+ +---------+--------+---+--------+--+---------+ VertebralPSV cm/s156EDV cm/s34Antegrade +---------+--------+---+--------+--+---------+ Left Carotid Findings: +----------+--------+--------+--------+-----------------------+--------+           PSV cm/sEDV cm/sStenosisDescribe               Comments +----------+--------+--------+--------+-----------------------+--------+ CCA Prox  79      18              smooth and heterogenous         +----------+--------+--------+--------+-----------------------+--------+ CCA Distal53      10              smooth and heterogenous         +----------+--------+--------+--------+-----------------------+--------+ ICA Prox  237     77      60-79%  calcific                        +----------+--------+--------+--------+-----------------------+--------+  ICA Mid   199     37              smooth and heterogenous         +----------+--------+--------+--------+-----------------------+--------+ ICA Distal97      17                                     tortuous +----------+--------+--------+--------+-----------------------+--------+ ECA       99      11                                              +----------+--------+--------+--------+-----------------------+--------+ +----------+--------+--------+--------+------------+ SubclavianPSV cm/sEDV cm/sDescribeArm Pressure  +----------+--------+--------+--------+------------+           128                                  +----------+--------+--------+--------+------------+ +---------+--------+--+--------+--+---------+ VertebralPSV cm/s53EDV cm/s24Antegrade +---------+--------+--+--------+--+---------+  ABI Findings: +--------+------------------+-----+---------+--------+ Right   Rt Pressure (mmHg)IndexWaveform Comment  +--------+------------------+-----+---------+--------+ [id number]                    triphasic         +--------+------------------+-----+---------+--------+ PTA     172               1.10 biphasic          +--------+------------------+-----+---------+--------+ DP      145               0.93 biphasic          +--------+------------------+-----+---------+--------+ +--------+------------------+-----+---------+-------+ Left    Lt Pressure (mmHg)IndexWaveform Comment +--------+------------------+-----+---------+-------+ Brachial150                    triphasic        +--------+------------------+-----+---------+-------+ PTA     250               1.60 triphasic        +--------+------------------+-----+---------+-------+ DP      161               1.03 biphasic         +--------+------------------+-----+---------+-------+ +-------+---------------+----------------+ ABI/TBIToday's ABI/TBIPrevious ABI/TBI +-------+---------------+----------------+ Right  1.1                             +-------+---------------+----------------+ Left   1.6                             +-------+---------------+----------------+  Right Doppler Findings: +--------+--------+-----+---------+--------+ Site    PressureIndexDoppler  Comments +--------+--------+-----+---------+--------+ [id number]          triphasic         +--------+--------+-----+---------+--------+ Radial               triphasic         +--------+--------+-----+---------+--------+ Ulnar                 triphasic         +--------+--------+-----+---------+--------+  Left Doppler Findings: +--------+--------+-----+---------+--------+ Site    PressureIndexDoppler  Comments +--------+--------+-----+---------+--------+ Brachial150          triphasic         +--------+--------+-----+---------+--------+ Radial  triphasic         +--------+--------+-----+---------+--------+ Ulnar                triphasic         +--------+--------+-----+---------+--------+  Summary: Right Carotid: Velocities in the right ICA are consistent with a 1-39% stenosis. Left Carotid: Velocities in the left ICA are consistent with a 60-79% stenosis. Vertebrals: Bilateral vertebral arteries demonstrate antegrade flow. Right ABI: Resting right ankle-brachial index is within normal range. No evidence of significant right lower extremity arterial disease. Left ABI: Resting left ankle-brachial index indicates noncompressible left lower extremity arteries. Right Upper Extremity: Doppler waveform obliterate with right radial compression. Doppler waveform obliterate with right ulnar compression. Left Upper Extremity: Doppler waveform obliterate with left radial compression. Doppler waveform obliterate with left ulnar compression.    Preliminary    Pamella Pert, MD, PhD [facility]  Between 7 am - 7 pm I am available, please contact me via Amion or Securechat  Between 7 pm - 7 am I am not available, please contact night coverage MD/APP via Amion

## 2020-07-28 ENCOUNTER — Inpatient Hospital Stay (HOSPITAL_COMMUNITY): Payer: Medicare Other

## 2020-07-28 ENCOUNTER — Encounter (HOSPITAL_COMMUNITY): Payer: Self-pay | Admitting: Cardiology

## 2020-07-28 ENCOUNTER — Other Ambulatory Visit: Payer: Self-pay

## 2020-07-28 DIAGNOSIS — I4891 Unspecified atrial fibrillation: Secondary | ICD-10-CM

## 2020-07-28 DIAGNOSIS — I2510 Atherosclerotic heart disease of native coronary artery without angina pectoris: Secondary | ICD-10-CM

## 2020-07-28 LAB — PULMONARY FUNCTION TEST
FEF 25-75 Pre: 1.96 L/sec
FEF2575-%Pred-Pre: 67 %
FEV1-%Pred-Pre: 65 %
FEV1-Pre: 2.47 L
FEV1FVC-%Pred-Pre: 97 %
FEV6-%Pred-Pre: 68 %
FEV6-Pre: 3.27 L
FEV6FVC-%Pred-Pre: 105 %
FVC-%Pred-Pre: 67 %
FVC-Pre: 3.41 L
Pre FEV1/FVC ratio: 72 %
Pre FEV6/FVC Ratio: 100 %

## 2020-07-28 LAB — CBC
HCT: 37.7 % — ABNORMAL LOW (ref 39.0–52.0)
Hemoglobin: 12 g/dL — ABNORMAL LOW (ref 13.0–17.0)
MCH: 29 pg (ref 26.0–34.0)
MCHC: 31.8 g/dL (ref 30.0–36.0)
MCV: 91.1 fL (ref 80.0–100.0)
Platelets: 226 10*3/uL (ref 150–400)
RBC: 4.14 MIL/uL — ABNORMAL LOW (ref 4.22–5.81)
RDW: 14.3 % (ref 11.5–15.5)
WBC: 7.2 10*3/uL (ref 4.0–10.5)
nRBC: 0 % (ref 0.0–0.2)

## 2020-07-28 LAB — BASIC METABOLIC PANEL
Anion gap: 10 (ref 5–15)
BUN: 6 mg/dL — ABNORMAL LOW (ref 8–23)
CO2: 21 mmol/L — ABNORMAL LOW (ref 22–32)
Calcium: 8.5 mg/dL — ABNORMAL LOW (ref 8.9–10.3)
Chloride: 106 mmol/L (ref 98–111)
Creatinine, Ser: 0.98 mg/dL (ref 0.61–1.24)
GFR calc Af Amer: >60 mL/min (ref >60)
GFR calc non Af Amer: >60 mL/min (ref >60)
Glucose, Bld: 94 mg/dL (ref 70–99)
Potassium: 3.6 mmol/L (ref 3.5–5.1)
Sodium: 137 mmol/L (ref 135–145)

## 2020-07-28 LAB — SURGICAL PCR SCREEN
MRSA, PCR: NEGATIVE
Staphylococcus aureus: NEGATIVE

## 2020-07-28 LAB — HEPARIN LEVEL (UNFRACTIONATED)
Heparin Unfractionated: 0.29 IU/mL — ABNORMAL LOW (ref 0.30–0.70)
Heparin Unfractionated: 0.4 IU/mL (ref 0.30–0.70)

## 2020-07-28 LAB — TYPE AND SCREEN
ABO/RH(D): O POS
Antibody Screen: NEGATIVE

## 2020-07-28 LAB — ABO/RH: ABO/RH(D): O POS

## 2020-07-28 MED ORDER — TRANEXAMIC ACID (OHS) PUMP PRIME SOLUTION
2.0000 mg/kg | INTRAVENOUS | Status: DC
  Filled 2020-07-28: qty 2.34

## 2020-07-28 MED ORDER — VANCOMYCIN HCL 1500 MG/300ML IV SOLN
1500.0000 mg | INTRAVENOUS | Status: AC
  Administered 2020-07-29: 1500 mg via INTRAVENOUS
  Filled 2020-07-28: qty 300

## 2020-07-28 MED ORDER — SODIUM CHLORIDE 0.9 % IV SOLN
INTRAVENOUS | Status: DC
  Filled 2020-07-28: qty 30

## 2020-07-28 MED ORDER — TRANEXAMIC ACID 1000 MG/10ML IV SOLN
1.5000 mg/kg/h | INTRAVENOUS | Status: DC
  Filled 2020-07-28 (×2): qty 25

## 2020-07-28 MED ORDER — SODIUM CHLORIDE 0.9 % IV SOLN
750.0000 mg | INTRAVENOUS | Status: AC
  Administered 2020-07-29: 750 mg via INTRAVENOUS
  Filled 2020-07-28: qty 750

## 2020-07-28 MED ORDER — METOPROLOL TARTRATE 12.5 MG HALF TABLET
12.5000 mg | ORAL_TABLET | Freq: Once | ORAL | Status: AC
  Administered 2020-07-29: 12.5 mg via ORAL
  Filled 2020-07-28: qty 1

## 2020-07-28 MED ORDER — INSULIN REGULAR(HUMAN) IN NACL 100-0.9 UT/100ML-% IV SOLN
INTRAVENOUS | Status: DC
  Filled 2020-07-28: qty 100

## 2020-07-28 MED ORDER — TEMAZEPAM 15 MG PO CAPS
15.0000 mg | ORAL_CAPSULE | Freq: Once | ORAL | Status: AC | PRN
  Administered 2020-07-28: 15 mg via ORAL
  Filled 2020-07-28: qty 1

## 2020-07-28 MED ORDER — HEPARIN SODIUM (PORCINE) 1000 UNIT/ML IJ SOLN
INTRAMUSCULAR | Status: DC | PRN
  Administered 2020-07-25: 5000 [IU] via INTRAVENOUS

## 2020-07-28 MED ORDER — SODIUM CHLORIDE 0.9 % IV SOLN
1.5000 g | INTRAVENOUS | Status: AC
  Administered 2020-07-29: 1.5 g via INTRAVENOUS
  Filled 2020-07-28: qty 1.5

## 2020-07-28 MED ORDER — BISACODYL 5 MG PO TBEC: 5.0000 mg | DELAYED_RELEASE_TABLET | Freq: Once | ORAL | Status: DC

## 2020-07-28 MED ORDER — MAGNESIUM SULFATE 50 % IJ SOLN
40.0000 meq | INTRAMUSCULAR | Status: DC
  Filled 2020-07-28: qty 9.85

## 2020-07-28 MED ORDER — PHENYLEPHRINE HCL-NACL 20-0.9 MG/250ML-% IV SOLN
30.0000 ug/min | INTRAVENOUS | Status: DC
  Filled 2020-07-28: qty 250

## 2020-07-28 MED ORDER — PLASMA-LYTE 148 IV SOLN
INTRAVENOUS | Status: DC
  Filled 2020-07-28: qty 2.5

## 2020-07-28 MED ORDER — EPINEPHRINE HCL 5 MG/250ML IV SOLN IN NS
0.0000 ug/min | INTRAVENOUS | Status: DC
  Filled 2020-07-28: qty 250

## 2020-07-28 MED ORDER — MILRINONE LACTATE IN DEXTROSE 20-5 MG/100ML-% IV SOLN
0.3000 ug/kg/min | INTRAVENOUS | Status: DC
  Filled 2020-07-28: qty 100

## 2020-07-28 MED ORDER — DEXMEDETOMIDINE HCL IN NACL 400 MCG/100ML IV SOLN
0.1000 ug/kg/h | INTRAVENOUS | Status: DC
  Filled 2020-07-28: qty 100

## 2020-07-28 MED ORDER — CHLORHEXIDINE GLUCONATE CLOTH 2 % EX PADS
6.0000 | MEDICATED_PAD | Freq: Once | CUTANEOUS | Status: AC
  Administered 2020-07-28: 6 via TOPICAL

## 2020-07-28 MED ORDER — NITROGLYCERIN IN D5W 200-5 MCG/ML-% IV SOLN
2.0000 ug/min | INTRAVENOUS | Status: DC
  Filled 2020-07-28: qty 250

## 2020-07-28 MED ORDER — CHLORHEXIDINE GLUCONATE CLOTH 2 % EX PADS
6.0000 | MEDICATED_PAD | Freq: Once | CUTANEOUS | Status: AC
  Administered 2020-07-29: 6 via TOPICAL

## 2020-07-28 MED ORDER — NOREPINEPHRINE 4 MG/250ML-% IV SOLN
0.0000 ug/min | INTRAVENOUS | Status: DC
  Filled 2020-07-28: qty 250

## 2020-07-28 MED ORDER — POTASSIUM CHLORIDE 2 MEQ/ML IV SOLN
80.0000 meq | INTRAVENOUS | Status: DC
  Filled 2020-07-28: qty 40

## 2020-07-28 MED ORDER — TRANEXAMIC ACID (OHS) BOLUS VIA INFUSION
15.0000 mg/kg | INTRAVENOUS | Status: AC
  Administered 2020-07-29: 1756.5 mg via INTRAVENOUS
  Filled 2020-07-28: qty 1757

## 2020-07-28 MED ORDER — CHLORHEXIDINE GLUCONATE 0.12 % MT SOLN
15.0000 mL | Freq: Once | OROMUCOSAL | Status: AC
  Administered 2020-07-29: 15 mL via OROMUCOSAL
  Filled 2020-07-28: qty 15

## 2020-07-28 NOTE — Progress Notes (Signed)
CARDIAC REHAB PHASE I   PRE:  Rate/Rhythm: 74 afib    BP: sitting 128/75    SaO2: 97 RA  MODE:  Ambulation: 250 ft   POST:  Rate/Rhythm: 112 afib    BP: sitting 152/82     SaO2: 99 RA  Pt able to walk hall, no major c/o. Mod weakness and min assist. No CP, just fatigue with distance. To recliner. Practiced standing following sternal precautions. He needed min-mod assist at arm even with rocking.  Discussed sternal precautions, IS (2000 mL), mobility post op and d/c planning. Pt is concerned about d/c as he lives alone. His brother is 2 hours away. We discussed different options. He would prefer SNF if he qualifies. Suggest PT c/s post op as he does have some orthopedic/mobility issues. Gave pt materials to read and view. Very receptive. [id number]  Harriet Masson CES, ACSM 07/28/2020 10:33 AM

## 2020-07-28 NOTE — Progress Notes (Signed)
Subjective:  Feels well, no chest pain, no specific complaints.  Has not had any significant ventricular standstill episodes, pauses through the night.  Intake/Output from previous day:  I/O last 3 completed shifts: In: 666.5 [P.O.:480; I.V.:186.5] Out: 1075 [Urine:1075] No intake/output data recorded.  Blood pressure 126/74, pulse 68, temperature 97.9 F (36.6 C), temperature source Oral, resp. rate 18, height '6\' 1"'  (1.854 m), weight 117.1 kg, SpO2 96 %.   Vitals with BMI 07/28/2020 07/27/2020 07/27/2020  Height - - -  Weight 258 lbs 1 oz - 259 lbs 10 oz  BMI 97.41 - 63.84  Systolic 536 468 032  Diastolic 74 69 82  Pulse 68 72 60    Physical Exam Constitutional:      Appearance: He is obese.  Cardiovascular:     Rate and Rhythm: Rhythm irregularly irregular.     Pulses: Intact distal pulses.          Dorsalis pedis pulses are 0 on the right side and 0 on the left side.       Posterior tibial pulses are 0 on the right side and 0 on the left side.     Heart sounds: No murmur heard.  No gallop. No S3 or S4 sounds.      Comments: Distant heart sounds. S1 is variable, S2 is normal. No JVD, No leg edema. Pulmonary:     Effort: Pulmonary effort is normal.     Breath sounds: Normal breath sounds.  Abdominal:     General: Bowel sounds are normal.     Palpations: Abdomen is soft.     Lab Results: BMP BNP (last 3 results) Recent Labs    07/24/20 0748  BNP 206.5*    ProBNP (last 3 results) No results for input(s): PROBNP in the last 8760 hours. BMP Latest Ref Rng & Units 07/28/2020 07/27/2020 07/26/2020  Glucose 70 - 99 mg/dL 94 96 96  BUN 8 - 23 mg/dL 6(L) 6(L) 6(L)  Creatinine 0.61 - 1.24 mg/dL 0.98 1.05 0.91  BUN/Creat Ratio 10 - 24 - - -  Sodium 135 - 145 mmol/L 137 137 137  Potassium 3.5 - 5.1 mmol/L 3.6 3.3(L) 3.8  Chloride 98 - 111 mmol/L 106 108 111  CO2 22 - 32 mmol/L 21(L) 20(L) 19(L)  Calcium 8.9 - 10.3 mg/dL 8.5(L) 8.3(L) 8.5(L)   Hepatic Function Latest Ref  Rng & Units 07/27/2020 07/26/2020 07/24/2020  Total Protein 6.5 - 8.1 g/dL 5.7(L) 6.0(L) 6.0(L)  Albumin 3.5 - 5.0 g/dL 2.4(L) 2.5(L) 2.7(L)  AST 15 - 41 U/L 21 22 34  ALT 0 - 44 U/L 21 21 32  Alk Phosphatase 38 - 126 U/L 105 120 163(H)  Total Bilirubin 0.3 - 1.2 mg/dL 1.4(H) 1.7(H) 1.9(H)   CBC Latest Ref Rng & Units 07/28/2020 07/27/2020 07/26/2020  WBC 4.0 - 10.5 K/uL 7.2 7.0 7.9  Hemoglobin 13.0 - 17.0 g/dL 12.0(L) 11.3(L) 11.4(L)  Hematocrit 39 - 52 % 37.7(L) 35.5(L) 36.4(L)  Platelets 150 - 400 K/uL 226 251 267   Lipid Panel Recent Labs    07/26/20 0515  CHOL 78  TRIG 92  LDLCALC 36  VLDL 18  HDL 24*  CHOLHDL 3.3    HEMOGLOBIN A1C Lab Results  Component Value Date   HGBA1C 5.2 07/26/2020   MPG 102.54 07/26/2020   TSH Recent Labs    07/02/20 1604 07/24/20 0748  TSH 1.898 0.941   Imaging: Cardiac Studies:  Left Heart Catheterization 07/25/20:  RCA: Proximal RCA 80% stenosis, large  vessel with mild disease in PL and PDA branches.  A secondary PL branch is subtotally occluded. LM: Distal 30-40% stenosis. LAD: LAD diffusely diseased, proximal diffuse 80% followed by tandem 90% stenosis.  Large D1 with moderate diffuse disease and proximal and mid tandem 70 and 80% stenosis.  Has secondary branches and is tortuous. Cx  and RI: Co-dominant Cx with Ostial circumflex 99% involving a moderate sized ramus with ostial 80 to 90% and proximal 90% stenosis. LV: Normal LVEDP.  No pressure gradient across the aortic valve. Patent LIMA and RIMA and RIMA has ostial 20-30% stenosis. Subclavian arteries widely patent.  Right radial diffusely disease by [place]. 30m contrast used.   Recommendation: Patient needs evaluation for inpatient CABG in view of NSVT, ventricular standstill.  Radial arterial conduits cannot be utilized as there was significant amount of atherosclerosis evident with ultrasound guidance.  Will discuss with surgery.  We could still consider Maze procedure and left  atrial appendage ligation.  Echocardiogram 07/24/2020:  1. Poor acoustic windows limt study. Overall LVEF is normal ENdocardium is difficult to see, especially at apex. Consider limited echo with Definity to confirm wall motion. . Left ventricular ejection fraction, by estimation, is 55 to 60%. The left  ventricle has normal function. Left ventricular endocardial border not optimally defined to evaluate regional wall motion. There is severe asymmetric left ventricular hypertrophy. Left ventricular diastolic parameters are indeterminate.  2. Right ventricular systolic function is normal. The right ventricular size is mildly enlarged.  3. The mitral valve is abnormal. Mild mitral valve regurgitation.  4. The aortic valve is abnormal. Aortic valve regurgitation is not visualized. Mild aortic valve sclerosis is present, with no evidence of aortic valve stenosis.  5. The inferior vena cava is normal in size with greater than 50% respiratory variability, suggesting right atrial pressure of 3 mmHg.  EKG 07/23/2020: Atrial fibrillation with controlled ventricular response, underlying left bundle branch block.  No further analysis.   Scheduled Meds:  aspirin EC  81 mg Oral Daily   [START ON 07/29/2020] epinephrine  0-10 mcg/min Intravenous To OR   [START ON 07/29/2020] heparin-papaverine-plasmalyte irrigation   Irrigation To OR   influenza vaccine adjuvanted  0.5 mL Intramuscular Tomorrow-1000   [START ON 07/29/2020] insulin   Intravenous To OR   [START ON 07/29/2020] magnesium sulfate  40 mEq Other To OR   [START ON 07/29/2020] phenylephrine  30-200 mcg/min Intravenous To OR   [START ON 07/29/2020] potassium chloride  80 mEq Other To OR   psyllium  1 packet Oral Daily   rosuvastatin  40 mg Oral Daily   sodium chloride flush  3 mL Intravenous Q12H   sodium chloride flush  3 mL Intravenous Q12H   [START ON 07/29/2020] tranexamic acid  15 mg/kg Intravenous To OR   [START ON 07/29/2020]  tranexamic acid  2 mg/kg Intracatheter To OR   Continuous Infusions:  sodium chloride     [START ON 07/29/2020] cefUROXime (ZINACEF)  IV     [START ON 07/29/2020] cefUROXime (ZINACEF)  IV     [START ON 07/29/2020] dexmedetomidine     [START ON 07/29/2020] heparin 30,000 units/NS 1000 mL solution for CELLSAVER     heparin 2,000 Units/hr (07/28/20 0449)   [START ON 07/29/2020] milrinone     [START ON 07/29/2020] nitroGLYCERIN     [START ON 07/29/2020] norepinephrine     [START ON 07/29/2020] tranexamic acid (CYKLOKAPRON) infusion ([facility])     [START ON 07/29/2020] vancomycin     PRN Meds:.sodium  chloride, acetaminophen **OR** acetaminophen, diphenhydrAMINE, HYDROcodone-acetaminophen, loperamide, ondansetron **OR** ondansetron (ZOFRAN) IV  Assessment/Plan:  1.  Multivessel coronary artery disease, also involves codominant circumflex coronary artery.  Awaiting CABG and maze procedure for permanent atrial fibrillation in the hopes of converting him back to sinus rhythm. 2.  Permanent atrial fibrillation CHA2DS2-VASc Score is 2.  Yearly risk of stroke: 2.3% (A, CAD).  Score of 1=0.6; 2=2.2; 3=3.2; 4=4.8; 5=7.2; 6=9.8; 7=>9.8) -(CHF; HTN; vasc disease DM,  Male = 1; Age <65 =0; 65-74 = 1,  >75 =2; stroke/embolism= 2).   3.  Left bundle branch block 4.  AV nodal disease, cannot exclude ischemia contributing to the AV nodal block and ventricular standstill.  Patient remains asymptomatic.  Recommendation: Patient with multivessel coronary disease and awaiting CABG and also left atrial appendage ligation and possibly Maze procedure hoping to convert back to sinus rhythm to improve diastolic function.  Continue IV heparin, Xarelto has been on hold, continue aspirin. He is not on a beta-blocker in view of high degree AV block.  Will change orders not to page unless he has symptomatic ventricular pauses of >6 seconds.  He has had a very brief 3.2-second pause last night.  Mother he will need permanent  pacemaker implantation is here to be seen after revascularization.   Adrian Prows, MD, [facility] 07/28/2020, 9:14 AM Office: [phone]

## 2020-07-28 NOTE — Progress Notes (Signed)
ANTICOAGULATION CONSULT NOTE - Follow Up Consult  Pharmacy Consult for heparin Indication: atrial fibrillation  Labs: Recent Labs    07/26/20 0515 07/26/20 0515 07/27/20 0201 07/27/20 0201 07/27/20 1517 07/28/20 0241 07/28/20 1036  HGB 11.4*   < > 11.3*  --   --  12.0*  --   HCT 36.4*  --  35.5*  --   --  37.7*  --   PLT 267  --  251  --   --  226  --   HEPARINUNFRC 0.39   < > 0.23*   < > 0.18* 0.29* 0.40  CREATININE 0.91  --  1.05  --   --  0.98  --    < > = values in this interval not displayed.    Assessment: 69yo male on IV heparin while awaiting CABG 9/21. Heparin level now therapeutic after rate adjustment.  Goal of Therapy:  Heparin level 0.3-0.7 units/ml   Plan:  Continue heparin 2000 units/h CABG tomorrow   Fredonia Highland, PharmD, BCPS Clinical Pharmacist [phone] Please check AMION for all [facility] Pharmacy numbers 07/28/2020

## 2020-07-28 NOTE — Progress Notes (Addendum)
Notified by CCMD pt HR dropped to 32 non sustained with a 3.23 sec pause noted. Pt sleeping soundly-will continue to monitor. Dierdre Highman, RN

## 2020-07-28 NOTE — Consult Note (Signed)
[address]       [place] [address]             [phone]        DICKEY CAAMANO [facility] Medical Record #[id number] Date of Birth: 1951/04/20  Referring: No ref. provider found Primary Care: Richmond Campbell PA-C Primary Cardiologist:No primary care provider on file.  Chief Complaint:    Chief Complaint  Patient presents with  . Dizziness    History of Present Illness:     69 year old man with approximately 5-year history of atrial fibrillation status post attempted cardioversion was admitted 3 weeks ago with dizziness.  He had a Holter monitor placed and was discharged.  He had another episode then last week of not feeling well.  This is relatively indistinct but it led him to call 911.  Patient was admitted to the hospital and underwent echocardiogram demonstrating reduced ejection fraction and and a nuclear stress test which suggested perfusion defect.  This prompted left heart catheterization demonstrating multivessel coronary artery disease disease.  He has question of a left bundle branch block.  Given the findings on left heart catheterization consultation is obtained from thoracic surgery for consideration of CABG and atrial ablation.  The patient has been awaiting surgery due to prior treatment with direct thrombin inhibitors.  He has been stable medically over the weekend.   Current Activity/ Functional Status: Patient will be independent with mobility/ambulation, transfers, ADL's, IADL's.   Zubrod Score: At the time of surgery this patient's most appropriate activity status/level should be described as: []     0    Normal activity, no symptoms []     1    Restricted in physical strenuous activity but ambulatory, able to do out light work []     2    Ambulatory and capable of self care, unable to do work activities, up and about                 more than 50%  Of the time                            []     3    Only limited self care, in bed greater  than 50% of waking hours []     4    Completely disabled, no self care, confined to bed or chair []     5    Moribund  Past Medical History:  Diagnosis Date  . A-fib (HCC)   . Atrial fibrillation (HCC)   . Dysrhythmia   . Hypertension   . LBBB (left bundle branch block)     Past Surgical History:  Procedure Laterality Date  . CARDIOVERSION N/A 09/28/2016   Procedure: CARDIOVERSION;  Surgeon: Yates Decamp, MD;  Location: [facility] ENDOSCOPY;  Service: Cardiovascular;  Laterality: N/A;  . LEFT HEART CATH AND CORONARY ANGIOGRAPHY N/A 07/25/2020   Procedure: LEFT HEART CATH AND CORONARY ANGIOGRAPHY;  Surgeon: Yates Decamp, MD;  Location: [facility] INVASIVE CV LAB;  Service: Cardiovascular;  Laterality: N/A;  . NO PAST SURGERIES      Social History   Tobacco Use  Smoking Status Never Smoker  Smokeless Tobacco Never Used    Social History   Substance and Sexual Activity  Alcohol Use No     No Known Allergies  Current Facility-Administered Medications  Medication Dose Route Frequency Provider Last Rate Last Admin  . 0.9 %  sodium chloride infusion  250 mL Intravenous PRN Yates Decamp, MD      . acetaminophen (TYLENOL) tablet 650 mg  650 mg Oral Q6H PRN Yates Decamp, MD       Or  . acetaminophen (TYLENOL) suppository 650 mg  650 mg Rectal Q6H PRN Yates Decamp, MD      . aspirin EC tablet 81 mg  81 mg Oral Daily Yates Decamp, MD   81 mg at 07/27/20 1049  . [START ON 07/29/2020] cefUROXime (ZINACEF) 1.5 g in sodium chloride 0.9 % 100 mL IVPB  1.5 g Intravenous To OR Cherisa Brucker, Merri Brunette, MD      . Melene Muller ON 07/29/2020] cefUROXime (ZINACEF) 750 mg in sodium chloride 0.9 % 100 mL IVPB  750 mg Intravenous To OR Taila Basinski, Merri Brunette, MD      . Melene Muller ON 07/29/2020] dexmedetomidine (PRECEDEX) 400 MCG/100ML (4 mcg/mL) infusion  0.1-0.7 mcg/kg/hr Intravenous To OR Styles Fambro Z, MD      . diphenhydrAMINE (BENADRYL) capsule 25 mg  25 mg Oral QHS PRN Leatha Gilding, MD   25 mg at 07/27/20 2050  . [START ON 07/29/2020]  EPINEPHrine (ADRENALIN) 4 mg in NS 250 mL (0.016 mg/mL) premix infusion  0-10 mcg/min Intravenous To OR Elvert Cumpton, Merri Brunette, MD      . Melene Muller ON 07/29/2020] heparin 30,000 units/NS 1000 mL solution for CELLSAVER   Other To OR Joely Losier Z, MD      . heparin ADULT infusion 100 units/mL (25000 units/273mL sodium chloride 0.45%)  2,000 Units/hr Intravenous Continuous Juliette Mangle, RPH 20 mL/hr at 07/28/20 0449 2,000 Units/hr at 07/28/20 0449  . [START ON 07/29/2020] heparin sodium (porcine) 2,500 Units, papaverine 30 mg in electrolyte-148 (PLASMALYTE-148) 500 mL irrigation   Irrigation To OR Adaleena Mooers, Merri Brunette, MD      . HYDROcodone-acetaminophen (NORCO/VICODIN) 5-325 MG per tablet 1-2 tablet  1-2 tablet Oral Q4H PRN Yates Decamp, MD      . influenza vaccine adjuvanted (FLUAD) injection 0.5 mL  0.5 mL Intramuscular Tomorrow-1000 Leatha Gilding, MD      . Melene Muller ON 07/29/2020] insulin regular, human (MYXREDLIN) 100 units/ 100 mL infusion   Intravenous To OR Eulalio Reamy, Merri Brunette, MD      . loperamide (IMODIUM) capsule 4 mg  4 mg Oral PRN Yates Decamp, MD      . Melene Muller ON 07/29/2020] magnesium sulfate (IV Push/IM) injection 40 mEq  40 mEq Other To OR Willis Holquin, Merri Brunette, MD      . Melene Muller ON 07/29/2020] milrinone (PRIMACOR) 20 MG/100 ML (0.2 mg/mL) infusion  0.3 mcg/kg/min Intravenous To OR Linden Dolin, MD      . Melene Muller ON 07/29/2020] nitroGLYCERIN 50 mg in dextrose 5 % 250 mL (0.2 mg/mL) infusion  2-200 mcg/min Intravenous To OR Xzavior Reinig, Merri Brunette, MD      . Melene Muller ON 07/29/2020] norepinephrine (LEVOPHED) 4mg  in premix infusion  0-40 mcg/min Intravenous To OR Vern Prestia, , MD      . ondansetron (ZOFRAN) tablet 4 mg  4 mg Oral Q6H PRN Merri Brunette, MD       Or  . ondansetron (ZOFRAN) injection 4 mg  4 mg Intravenous Q6H PRN Yates Decamp, MD      . Yates Decamp ON 07/29/2020] phenylephrine (NEOSYNEPHRINE) 20-0.9 MG/250ML-% infusion  30-200 mcg/min Intravenous To OR 07/31/2020, MD      . Linden Dolin ON  07/29/2020] potassium chloride injection 80 mEq  80 mEq Other To OR 07/31/2020, Vickey Sages, MD      .  psyllium (HYDROCIL/METAMUCIL) 1 packet  1 packet Oral Daily Yates Decamp, MD   1 packet at 07/27/20 1048  . rosuvastatin (CRESTOR) tablet 40 mg  40 mg Oral Daily Yates Decamp, MD   40 mg at 07/27/20 1050  . sodium chloride flush (NS) 0.9 % injection 3 mL  3 mL Intravenous Q12H Yates Decamp, MD   3 mL at 07/25/20 0936  . sodium chloride flush (NS) 0.9 % injection 3 mL  3 mL Intravenous Q12H Yates Decamp, MD   3 mL at 07/25/20 2040  . [START ON 07/29/2020] tranexamic acid (CYKLOKAPRON) 2,500 mg in sodium chloride 0.9 % 250 mL (10 mg/mL) infusion  1.5 mg/kg/hr Intravenous To OR Matej Sappenfield, Merri Brunette, MD      . Melene Muller ON 07/29/2020] tranexamic acid (CYKLOKAPRON) bolus via infusion - over 30 minutes 1,756.5 mg  15 mg/kg Intravenous To OR Jerrin Recore, Merri Brunette, MD      . Melene Muller ON 07/29/2020] tranexamic acid (CYKLOKAPRON) pump prime solution 234 mg  2 mg/kg Intracatheter To OR Dirck Butch, Merri Brunette, MD      . Melene Muller ON 07/29/2020] vancomycin (VANCOREADY) IVPB 1500 mg/300 mL  1,500 mg Intravenous To OR Desiray Orchard, Merri Brunette, MD        Medications Prior to Admission  Medication Sig Dispense Refill Last Dose  . amLODipine (NORVASC) 10 MG tablet TAKE 1 TABLET BY MOUTH  DAILY (Patient taking differently: Take 10 mg by mouth every evening. ) 90 tablet 1 07/22/2020  . olmesartan (BENICAR) 40 MG tablet TAKE 1 TABLET BY MOUTH  DAILY (Patient taking differently: Take 40 mg by mouth daily. ) 90 tablet 3 07/23/2020 at Unknown time  . rosuvastatin (CRESTOR) 10 MG tablet TAKE 1 TABLET BY MOUTH  DAILY (Patient taking differently: Take 10 mg by mouth every evening. ) 90 tablet 3 07/22/2020  . XARELTO 20 MG TABS tablet TAKE 1 TABLET BY MOUTH IN  THE EVENING AFTER DINNER (Patient taking differently: Take 20 mg by mouth daily after supper. ) 90 tablet 1 07/22/2020 at 1930  . metoprolol tartrate (LOPRESSOR) 25 MG tablet TAKE 1 TABLET BY MOUTH  TWICE DAILY  (Patient not taking: Reported on 07/24/2020) 180 tablet 3 Not Taking at Unknown time    Family History  Problem Relation Age of Onset  . Hypertension Other      Review of Systems:   ROS Pertinent items noted in HPI and remainder of comprehensive ROS otherwise negative.     Cardiac Review of Systems: Y or  [    ]= no  Chest Pain [    ]  Resting SOB [   ] Exertional SOB  [  ]  Orthopnea [  ]   Pedal Edema [   ]    Palpitations [  ] Syncope  [  ]   Presyncope [   ]  General Review of Systems: [Y] = yes [  ]=no Constitional: recent weight change [  ]; anorexia [  ]; fatigue [  ]; nausea [  ]; night sweats [  ]; fever [  ]; or chills [  ]                                                               Dental: Last Dentist visit:  Eye : blurred vision [  ]; diplopia [   ]; vision changes [  ];  Amaurosis fugax[  ]; Resp: cough [  ];  wheezing[  ];  hemoptysis[  ]; shortness of breath[  ]; paroxysmal nocturnal dyspnea[  ]; dyspnea on exertion[  ]; or orthopnea[  ];  GI:  gallstones[  ], vomiting[  ];  dysphagia[  ]; melena[  ];  hematochezia [  ]; heartburn[  ];   Hx of  Colonoscopy[  ]; GU: kidney stones [  ]; hematuria[  ];   dysuria [  ];  nocturia[  ];  history of     obstruction [  ]; urinary frequency [  ]             Skin: rash, swelling[  ];, hair loss[  ];  peripheral edema[  ];  or itching[  ]; Musculosketetal: myalgias[  ];  joint swelling[  ];  joint erythema[  ];  joint pain[  ];  back pain[  ];  Heme/Lymph: bruising[  ];  bleeding[  ];  anemia[  ];  Neuro: TIA[  ];  headaches[  ];  stroke[  ];  vertigo[  ];  seizures[  ];   paresthesias[  ];  difficulty walking[  ];  Psych:depression[  ]; anxiety[  ];  Endocrine: diabetes[  ];  thyroid dysfunction[  ];           Physical Exam: BP 126/74 (BP Location: Right Arm)   Pulse 68   Temp 97.9 F (36.6 C) (Oral)   Resp 18   Ht 6\' 1"  (1.854 m)   Wt 117.1 kg   SpO2 96%   BMI 34.05 kg/m    General appearance: alert and  cooperative Head: Normocephalic, without obvious abnormality, atraumatic Neck: no adenopathy, no carotid bruit, no JVD, supple, symmetrical, trachea midline and thyroid not enlarged, symmetric, no tenderness/mass/nodules Resp: clear to auscultation bilaterally Cardio: irregularly irregular rhythm GI: soft, non-tender; bowel sounds normal; no masses,  no organomegaly Extremities: extremities normal, atraumatic, no cyanosis or edema Neurologic: Alert and oriented X 3, normal strength and tone. Normal symmetric reflexes. Normal coordination and gait I have personally performed a modified Allen's test using plethysmography of the left thumb to stimulate left radial artery removal.  This demonstrates good thumb perfusion via the left ulnar artery and through the palmar arch. Diagnostic Studies & Laboratory data:     Recent Radiology Findings:   VAS DOPPLER PRE CABG  Result Date: 07/27/2020 PREOPERATIVE VASCULAR EVALUATION  Indications:      Pre-CABG. Risk Factors:     Hypertension. Comparison Study: No prior studies. Performing Technologist: 07/29/2020 RVT  Examination Guidelines: A complete evaluation includes B-mode imaging, spectral Doppler, color Doppler, and power Doppler as needed of all accessible portions of each vessel. Bilateral testing is considered an integral part of a complete examination. Limited examinations for reoccurring indications may be performed as noted.  Right Carotid Findings: +----------+--------+--------+--------+-----------------------+--------+           PSV cm/sEDV cm/sStenosisDescribe               Comments +----------+--------+--------+--------+-----------------------+--------+ CCA Prox  143     15              smooth and heterogenous         +----------+--------+--------+--------+-----------------------+--------+ CCA Distal66      10              smooth and heterogenous          +----------+--------+--------+--------+-----------------------+--------+  ICA Prox  50      19              calcific                        +----------+--------+--------+--------+-----------------------+--------+ ICA Distal96      31                                     tortuous +----------+--------+--------+--------+-----------------------+--------+ ECA       128     15                                              +----------+--------+--------+--------+-----------------------+--------+ Portions of this table do not appear on this page. +----------+--------+-------+--------+------------+           PSV cm/sEDV cmsDescribeArm Pressure +----------+--------+-------+--------+------------+ Subclavian101                                 +----------+--------+-------+--------+------------+ +---------+--------+---+--------+--+---------+ VertebralPSV cm/s156EDV cm/s34Antegrade +---------+--------+---+--------+--+---------+ Left Carotid Findings: +----------+--------+--------+--------+-----------------------+--------+           PSV cm/sEDV cm/sStenosisDescribe               Comments +----------+--------+--------+--------+-----------------------+--------+ CCA Prox  79      18              smooth and heterogenous         +----------+--------+--------+--------+-----------------------+--------+ CCA Distal53      10              smooth and heterogenous         +----------+--------+--------+--------+-----------------------+--------+ ICA Prox  237     77      60-79%  calcific                        +----------+--------+--------+--------+-----------------------+--------+ ICA Mid   199     37              smooth and heterogenous         +----------+--------+--------+--------+-----------------------+--------+ ICA Distal97      17                                     tortuous +----------+--------+--------+--------+-----------------------+--------+ ECA       99       11                                              +----------+--------+--------+--------+-----------------------+--------+ +----------+--------+--------+--------+------------+ SubclavianPSV cm/sEDV cm/sDescribeArm Pressure +----------+--------+--------+--------+------------+           128                                  +----------+--------+--------+--------+------------+ +---------+--------+--+--------+--+---------+ VertebralPSV cm/s53EDV cm/s24Antegrade +---------+--------+--+--------+--+---------+  ABI Findings: +--------+------------------+-----+---------+--------+ Right   Rt Pressure (mmHg)IndexWaveform Comment  +--------+------------------+-----+---------+--------+ [id number]                    triphasic         +--------+------------------+-----+---------+--------+  PTA     172               1.10 biphasic          +--------+------------------+-----+---------+--------+ DP      145               0.93 biphasic          +--------+------------------+-----+---------+--------+ +--------+------------------+-----+---------+-------+ Left    Lt Pressure (mmHg)IndexWaveform Comment +--------+------------------+-----+---------+-------+ Brachial150                    triphasic        +--------+------------------+-----+---------+-------+ PTA     250               1.60 triphasic        +--------+------------------+-----+---------+-------+ DP      161               1.03 biphasic         +--------+------------------+-----+---------+-------+ +-------+---------------+----------------+ ABI/TBIToday's ABI/TBIPrevious ABI/TBI +-------+---------------+----------------+ Right  1.1                             +-------+---------------+----------------+ Left   1.6                             +-------+---------------+----------------+  Right Doppler Findings: +--------+--------+-----+---------+--------+ Site    PressureIndexDoppler  Comments  +--------+--------+-----+---------+--------+ [id number]          triphasic         +--------+--------+-----+---------+--------+ Radial               triphasic         +--------+--------+-----+---------+--------+ Ulnar                triphasic         +--------+--------+-----+---------+--------+  Left Doppler Findings: +--------+--------+-----+---------+--------+ Site    PressureIndexDoppler  Comments +--------+--------+-----+---------+--------+ Brachial150          triphasic         +--------+--------+-----+---------+--------+ Radial               triphasic         +--------+--------+-----+---------+--------+ Ulnar                triphasic         +--------+--------+-----+---------+--------+  Summary: Right Carotid: Velocities in the right ICA are consistent with a 1-39% stenosis. Left Carotid: Velocities in the left ICA are consistent with a 60-79% stenosis. Vertebrals: Bilateral vertebral arteries demonstrate antegrade flow. Right ABI: Resting right ankle-brachial index is within normal range. No evidence of significant right lower extremity arterial disease. Left ABI: Resting left ankle-brachial index indicates noncompressible left lower extremity arteries. Right Upper Extremity: Doppler waveform obliterate with right radial compression. Doppler waveform obliterate with right ulnar compression. Left Upper Extremity: Doppler waveform obliterate with left radial compression. Doppler waveform obliterate with left ulnar compression.    Preliminary      I have independently reviewed the above radiologic studies and discussed with the patient   Recent Lab Findings: Lab Results  Component Value Date   WBC 7.2 07/28/2020   HGB 12.0 (L) 07/28/2020   HCT 37.7 (L) 07/28/2020   PLT 226 07/28/2020   GLUCOSE 94 07/28/2020   CHOL 78 07/26/2020   TRIG 92 07/26/2020   HDL 24 (L) 07/26/2020   LDLCALC 36 07/26/2020   ALT 21 07/27/2020   AST 21 07/27/2020   NA 137  07/28/2020   K 3.6 07/28/2020   CL 106 07/28/2020   CREATININE 0.98 07/28/2020   BUN 6 (L) 07/28/2020   CO2 21 (L) 07/28/2020   TSH 0.941 07/24/2020   HGBA1C 5.2 07/26/2020      Assessment / Plan:      69 year old gentleman with multivessel coronary artery disease and longstanding history of atrial fibrillation.  He is scheduled for coronary bypass grafting and maze procedure tomorrow.  He has had his preoperative cardiac work-up and is considered a good candidate for the procedure.  All questions have been answered to his apparent satisfaction and he wishes to proceed    I  spent 30 minutes counseling the patient face to face.   Ellena Kamen Z. Vickey Sages, MD [phone] 07/28/2020 9:31 AM

## 2020-07-28 NOTE — Progress Notes (Signed)
ANTICOAGULATION CONSULT NOTE - Follow Up Consult  Pharmacy Consult for heparin Indication: atrial fibrillation  Labs: Recent Labs    07/25/20 0422 07/25/20 0422 07/26/20 0515 07/26/20 0515 07/27/20 0201 07/27/20 1517 07/28/20 0241  HGB 11.4*   < > 11.4*  --  11.3*  --   --   HCT 36.5*  --  36.4*  --  35.5*  --   --   PLT 285  --  267  --  251  --   --   APTT 105*  --   --   --   --   --   --   HEPARINUNFRC 0.58   < > 0.39   < > 0.23* 0.18* 0.29*  CREATININE 1.07   < > 0.91  --  1.05  --  0.98   < > = values in this interval not displayed.    Assessment: 69yo male remains slightly subtherapeutic on heparin after rate changes; no gtt issues or signs of bleeding per RN.  Goal of Therapy:  Heparin level 0.3-0.7 units/ml   Plan:  Will increase heparin gtt by 1 unit/kg/hr to 2000 units/hr and check level in 6 hours.    Vernard Gambles, PharmD, BCPS  07/28/2020,4:15 AM

## 2020-07-28 NOTE — Progress Notes (Signed)
PROGRESS NOTE  [name] Rice XWR:[id number] DOB: 02/27/1951 DOA: 07/23/2020 PCP: [name], PA-C   LOS: 4 days   Brief Narrative / Interim history: 69 year old male with history of A. fib on Xarelto, left bundle branch block, chronic systolic CHF with an EF of 45% back in February 2020, comes to the hospital with lightheadedness.  He was evaluated as an outpatient by his cardiologist, and due to concern for arrhythmias a Holter monitor was placed.  His metoprolol was also discontinued.  He was admitted to the hospital, cardiology was consulted and he underwent a cardiac cath on 9/16 with recommendations for CABG  Subjective / 24h Interval events: Reports he is apprehensive about surgery.  Slept well.  No chest pain, no palpitations  Assessment & Plan: Principal Problem Coronary artery disease -given arrhythmia and risk factors as above underwent on 9/17 which showed significant multivessel disease.  Cardiothoracic surgery consulted, plans in place for CABG tomorrow  Active Problems Lightheadedness/presyncope due to bradycardia, frequent pauses-appreciate cardiology consultation.  The 14-day extended Holter monitor had significant events with heart rate varying 25-197 bpm's, several pauses more than 3 seconds as well as couple of pauses of around 9 seconds.  There are also reported NSVT.  -He continues to have pauses 4-5 seconds here as well, asymptomatic, not associated with hypotension -Hold all AV nodal blocking agents, continue to monitor  Chronic atrial fibrillation - Xarelto has been held he was started on IV heparin continue to hold beta-blockers.  He will have Maze procedure as well as an LAA clip during surgery tomorrow  Essential hypertension with chronic kidney disease stage IIIa -Holding all antihypertensive medication.  Acute kidney injury on chronic kidney disease stage IIIa -creatinine as high as 1.7.  Creatinine has currently normalized  Hypercholesterolemia  -Continue statins.  Hypokalemia -Replete orally.  Diarrhea -Of unclear etiology C. difficile PCR is negative. Started on Metamucil and Imodium.  Scheduled Meds:  aspirin EC  81 mg Oral Daily   bisacodyl  5 mg Oral Once   [START ON 07/29/2020] chlorhexidine  15 mL Mouth/Throat Once   Chlorhexidine Gluconate Cloth  6 each Topical Once   And   Chlorhexidine Gluconate Cloth  6 each Topical Once   [START ON 07/29/2020] epinephrine  0-10 mcg/min Intravenous To OR   [START ON 07/29/2020] heparin-papaverine-plasmalyte irrigation   Irrigation To OR   influenza vaccine adjuvanted  0.5 mL Intramuscular Tomorrow-1000   [START ON 07/29/2020] insulin   Intravenous To OR   [START ON 07/29/2020] magnesium sulfate  40 mEq Other To OR   [START ON 07/29/2020] metoprolol tartrate  12.5 mg Oral Once   [START ON 07/29/2020] phenylephrine  30-200 mcg/min Intravenous To OR   [START ON 07/29/2020] potassium chloride  80 mEq Other To OR   psyllium  1 packet Oral Daily   rosuvastatin  40 mg Oral Daily   sodium chloride flush  3 mL Intravenous Q12H   sodium chloride flush  3 mL Intravenous Q12H   [START ON 07/29/2020] tranexamic acid  15 mg/kg Intravenous To OR   [START ON 07/29/2020] tranexamic acid  2 mg/kg Intracatheter To OR   Continuous Infusions:  sodium chloride     [START ON 07/29/2020] cefUROXime (ZINACEF)  IV     [START ON 07/29/2020] cefUROXime (ZINACEF)  IV     [START ON 07/29/2020] dexmedetomidine     [START ON 07/29/2020] heparin 30,000 units/NS 1000 mL solution for CELLSAVER     heparin 2,000 Units/hr (07/28/20 0449)   [  START ON 07/29/2020] milrinone     [START ON 07/29/2020] nitroGLYCERIN     [START ON 07/29/2020] norepinephrine     [START ON 07/29/2020] tranexamic acid (CYKLOKAPRON) infusion ([facility])     [START ON 07/29/2020] vancomycin     PRN Meds:.sodium chloride, acetaminophen **OR** acetaminophen, diphenhydrAMINE, HYDROcodone-acetaminophen, loperamide, ondansetron **OR**  ondansetron (ZOFRAN) IV, temazepam  Diet Orders (From admission, onward)    Start     Ordered   07/29/20 0001  Diet NPO time specified  Diet effective midnight        07/28/20 1003   07/25/20 0841  Diet Heart Room service appropriate? Yes; Fluid consistency: Thin  Diet effective now       Question Answer Comment  Room service appropriate? Yes   Fluid consistency: Thin      07/25/20 0840          DVT prophylaxis:      Code Status: Full Code  Family Communication: no family at bedside   Status is: Inpatient  Remains inpatient appropriate because:Ongoing diagnostic testing needed not appropriate for outpatient work up   Dispo: The patient is from: Home              Anticipated d/c is to: Home              Anticipated d/c date is: > 3 days              Patient currently is not medically stable to d/c.  Consultants:  Cardiology  Thoracic surgery   Procedures:  Cardiac cath 9/17  Left Heart Catheterization 07/25/20:  RCA: Proximal RCA 80% stenosis, large vessel with mild disease in PL and PDA branches.  A secondary PL branch is subtotally occluded. LM: Distal 30-40% stenosis. LAD: LAD diffusely diseased, proximal diffuse 80% followed by tandem 90% stenosis.  Large D1 with moderate diffuse disease and proximal and mid tandem 70 and 80% stenosis.  Has secondary branches and is tortuous. Cx  and RI: Co-dominant Cx with Ostial circumflex 99% involving a moderate sized ramus with ostial 80 to 90% and proximal 90% stenosis. LV: Normal LVEDP.  No pressure gradient across the aortic valve. Patent LIMA and RIMA and RIMA has ostial 20-30% stenosis. Subclavian arteries widely patent.  Right radial diffusely disease by US. 65mL contrast used.   Microbiology  None   Antimicrobials: None     Objective: Vitals:   07/27/20 1254 07/27/20 1255 07/27/20 2041 07/28/20 0557  BP:  140/69  126/74  Pulse:  72  68  Resp: 18 18 18    Temp:  (!) 97.5 F (36.4 C) 98.1 F (36.7 C) 97.9  F (36.6 C)  TempSrc:  Oral Oral Oral  SpO2:  96% 96%   Weight:    117.1 kg  Height:        Intake/Output Summary (Last 24 hours) at 07/28/2020 1132 Last data filed at 07/27/2020 2100 Gross per 24 hour  Intake --  Output 550 ml  Net -550 ml   Filed Weights   07/26/20 0510 07/27/20 0557 07/28/20 0557  Weight: 118.7 kg 117.8 kg 117.1 kg    Examination:  Constitutional: NAD Respiratory: Clear bilaterally, no wheezing or crackles Cardiovascular: Irregular, no edema    Data Reviewed: I have independently reviewed following labs and imaging studies   CBC: Recent Labs  Lab 07/24/20 1122 07/25/20 0422 07/26/20 0515 07/27/20 0201 07/28/20 0241  WBC 8.2 6.2 7.9 7.0 7.2  NEUTROABS 5.5  --   --   --   --  HGB 12.8* 11.4* 11.4* 11.3* 12.0*  HCT 42.7 36.5* 36.4* 35.5* 37.7*  MCV 94.5 92.4 90.3 90.6 91.1  PLT 295 285 267 251 226   Basic Metabolic Panel: Recent Labs  Lab 07/24/20 0210 07/24/20 0210 07/24/20 0748 07/24/20 1122 07/25/20 0422 07/26/20 0515 07/27/20 0201 07/28/20 0241  NA 137   < > 138  --  138 137 137 137  K 3.5   < > 3.5  --  3.8 3.8 3.3* 3.6  CL 108   < > 109  --  112* 111 108 106  CO2 18*   < > 17*  --  19* 19* 20* 21*  GLUCOSE 104*   < > 107*  --  86 96 96 94  BUN 17   < > 18  --  11 6* 6* 6*  CREATININE 1.74*   < > 1.45*  --  1.07 0.91 1.05 0.98  CALCIUM 8.3*   < > 8.4*  --  8.3* 8.5* 8.3* 8.5*  MG 1.6*  --   --  1.7  --  1.7  --   --   PHOS 2.8  --   --  2.8  --   --   --   --    < > = values in this interval not displayed.   Liver Function Tests: Recent Labs  Lab 07/23/20 1221 07/24/20 0748 07/26/20 0515 07/27/20 0201  AST 48* 34 22 21  ALT 36 32 21 21  ALKPHOS 166* 163* 120 105  BILITOT 2.0* 1.9* 1.7* 1.4*  PROT 6.6 6.0* 6.0* 5.7*  ALBUMIN 2.9* 2.7* 2.5* 2.4*   Coagulation Profile: No results for input(s): INR, PROTIME in the last 168 hours. HbA1C: Recent Labs    07/26/20 0515  HGBA1C 5.2   CBG: Recent Labs  Lab  07/23/20 1659  GLUCAP 117*    Recent Results (from the past 240 hour(s))  SARS Coronavirus 2 by RT PCR (hospital order, performed in [facility] hospital lab) Nasopharyngeal Nasopharyngeal Swab     Status: None   Collection Time: 07/23/20 11:46 PM   Specimen: Nasopharyngeal Swab  Result Value Ref Range Status   SARS Coronavirus 2 NEGATIVE NEGATIVE Final    Comment: (NOTE) SARS-CoV-2 target nucleic acids are NOT DETECTED.  The SARS-CoV-2 RNA is generally detectable in upper and lower respiratory specimens during the acute phase of infection. The lowest concentration of SARS-CoV-2 viral copies this assay can detect is 250 copies / mL. A negative result does not preclude SARS-CoV-2 infection and should not be used as the sole basis for treatment or other patient management decisions.  A negative result may occur with improper specimen collection / handling, submission of specimen other than nasopharyngeal swab, presence of viral mutation(s) within the areas targeted by this assay, and inadequate number of viral copies (<250 copies / mL). A negative result must be combined with clinical observations, patient history, and epidemiological information.  Fact Sheet for Patients:   BoilerBrush.com.cy  Fact Sheet for Healthcare Providers: https://pope.com/  This test is not yet approved or  cleared by the [place] FDA and has been authorized for detection and/or diagnosis of SARS-CoV-2 by FDA under an Emergency Use Authorization (EUA).  This EUA will remain in effect (meaning this test can be used) for the duration of the COVID-19 declaration under Section 564(b)(1) of the Act, 21 U.S.C. section 360bbb-3(b)(1), unless the authorization is terminated or revoked sooner.  Performed at [facility], [address] [place],   [address]   C Difficile Quick Screen w PCR reflex     Status: None   Collection Time: 07/24/20  12:42 AM   Specimen: STOOL  Result Value Ref Range Status   C Diff antigen NEGATIVE NEGATIVE Final   C Diff toxin NEGATIVE NEGATIVE Final   C Diff interpretation No C. difficile detected.  Final    Comment: Performed at [facility], [address] [place], [place] [address]  Gastrointestinal Panel by PCR , Stool     Status: None   Collection Time: 07/24/20 12:42 AM   Specimen: Stool  Result Value Ref Range Status   Campylobacter species NOT DETECTED NOT DETECTED Final   Plesimonas shigelloides NOT DETECTED NOT DETECTED Final   Salmonella species NOT DETECTED NOT DETECTED Final   Yersinia enterocolitica NOT DETECTED NOT DETECTED Final   Vibrio species NOT DETECTED NOT DETECTED Final   Vibrio cholerae NOT DETECTED NOT DETECTED Final   Enteroaggregative E coli (EAEC) NOT DETECTED NOT DETECTED Final   Enteropathogenic E coli (EPEC) NOT DETECTED NOT DETECTED Final   Enterotoxigenic E coli (ETEC) NOT DETECTED NOT DETECTED Final   Shiga like toxin producing E coli (STEC) NOT DETECTED NOT DETECTED Final   Shigella/Enteroinvasive E coli (EIEC) NOT DETECTED NOT DETECTED Final   Cryptosporidium NOT DETECTED NOT DETECTED Final   Cyclospora cayetanensis NOT DETECTED NOT DETECTED Final   Entamoeba histolytica NOT DETECTED NOT DETECTED Final   Giardia lamblia NOT DETECTED NOT DETECTED Final   Adenovirus F40/41 NOT DETECTED NOT DETECTED Final   Astrovirus NOT DETECTED NOT DETECTED Final   Norovirus GI/GII NOT DETECTED NOT DETECTED Final   Rotavirus A NOT DETECTED NOT DETECTED Final   Sapovirus (I, II, IV, and V) NOT DETECTED NOT DETECTED Final    Comment: Performed at [facility], [address] [place], [place] [address]     Radiology Studies: VAS US DOPPLER PRE CABG  Result Date: 07/27/2020 PREOPERATIVE VASCULAR EVALUATION  Indications:      Pre-CABG. Risk Factors:     Hypertension. Comparison Study: No prior studies. Performing Technologist: Olen Cordial RVT  Examination  Guidelines: A complete evaluation includes B-mode imaging, spectral Doppler, color Doppler, and power Doppler as needed of all accessible portions of each vessel. Bilateral testing is considered an integral part of a complete examination. Limited examinations for reoccurring indications may be performed as noted.  Right Carotid Findings: +----------+--------+--------+--------+-----------------------+--------+             PSV cm/s EDV cm/s Stenosis Describe                Comments  +----------+--------+--------+--------+-----------------------+--------+  CCA Prox   143      15                smooth and heterogenous           +----------+--------+--------+--------+-----------------------+--------+  CCA Distal 66       10                smooth and heterogenous           +----------+--------+--------+--------+-----------------------+--------+  ICA Prox   50       19                calcific                          +----------+--------+--------+--------+-----------------------+--------+  ICA Distal 96       31  tortuous  +----------+--------+--------+--------+-----------------------+--------+  ECA        128      15                                                  +----------+--------+--------+--------+-----------------------+--------+ Portions of this table do not appear on this page. +----------+--------+-------+--------+------------+             PSV cm/s EDV cms Describe Arm Pressure  +----------+--------+-------+--------+------------+  Subclavian 101                                     +----------+--------+-------+--------+------------+ +---------+--------+---+--------+--+---------+  Vertebral PSV cm/s 156 EDV cm/s 34 Antegrade  +---------+--------+---+--------+--+---------+ Left Carotid Findings: +----------+--------+--------+--------+-----------------------+--------+             PSV cm/s EDV cm/s Stenosis Describe                Comments   +----------+--------+--------+--------+-----------------------+--------+  CCA Prox   79       18                smooth and heterogenous           +----------+--------+--------+--------+-----------------------+--------+  CCA Distal 53       10                smooth and heterogenous           +----------+--------+--------+--------+-----------------------+--------+  ICA Prox   237      77       60-79%   calcific                          +----------+--------+--------+--------+-----------------------+--------+  ICA Mid    199      37                smooth and heterogenous           +----------+--------+--------+--------+-----------------------+--------+  ICA Distal 97       17                                        tortuous  +----------+--------+--------+--------+-----------------------+--------+  ECA        99       11                                                  +----------+--------+--------+--------+-----------------------+--------+ +----------+--------+--------+--------+------------+  Subclavian PSV cm/s EDV cm/s Describe Arm Pressure  +----------+--------+--------+--------+------------+             128                                      +----------+--------+--------+--------+------------+ +---------+--------+--+--------+--+---------+  Vertebral PSV cm/s 53 EDV cm/s 24 Antegrade  +---------+--------+--+--------+--+---------+  ABI Findings: +--------+------------------+-----+---------+--------+  Right    Rt Pressure (mmHg) Index Waveform  Comment   +--------+------------------+-----+---------+--------+  Brachial 156  triphasic           +--------+------------------+-----+---------+--------+  PTA      172                1.10  biphasic            +--------+------------------+-----+---------+--------+  DP       145                0.93  biphasic            +--------+------------------+-----+---------+--------+ +--------+------------------+-----+---------+-------+  Left     Lt Pressure  (mmHg) Index Waveform  Comment  +--------+------------------+-----+---------+-------+  Brachial 150                      triphasic          +--------+------------------+-----+---------+-------+  PTA      250                1.60  triphasic          +--------+------------------+-----+---------+-------+  DP       161                1.03  biphasic           +--------+------------------+-----+---------+-------+ +-------+---------------+----------------+  ABI/TBI Today's ABI/TBI Previous ABI/TBI  +-------+---------------+----------------+  Right   1.1                               +-------+---------------+----------------+  Left    1.6                               +-------+---------------+----------------+  Right Doppler Findings: +--------+--------+-----+---------+--------+  Site     Pressure Index Doppler   Comments  +--------+--------+-----+---------+--------+  Brachial 156            triphasic           +--------+--------+-----+---------+--------+  Radial                  triphasic           +--------+--------+-----+---------+--------+  Ulnar                   triphasic           +--------+--------+-----+---------+--------+  Left Doppler Findings: +--------+--------+-----+---------+--------+  Site     Pressure Index Doppler   Comments  +--------+--------+-----+---------+--------+  Brachial 150            triphasic           +--------+--------+-----+---------+--------+  Radial                  triphasic           +--------+--------+-----+---------+--------+  Ulnar                   triphasic           +--------+--------+-----+---------+--------+  Summary: Right Carotid: Velocities in the right ICA are consistent with a 1-39% stenosis. Left Carotid: Velocities in the left ICA are consistent with a 60-79% stenosis. Vertebrals: Bilateral vertebral arteries demonstrate antegrade flow. Right ABI: Resting right ankle-brachial index is within normal range. No evidence of significant right lower extremity arterial disease.  Left ABI: Resting left ankle-brachial index indicates noncompressible left lower extremity arteries. Right Upper Extremity: Doppler waveform obliterate with right radial compression. Doppler waveform obliterate with right ulnar compression. Left Upper Extremity:  Doppler waveform obliterate with left radial compression. Doppler waveform obliterate with left ulnar compression.    Preliminary    Pamella Pert, MD, PhD [facility]  Between 7 am - 7 pm I am available, please contact me via Amion or Securechat  Between 7 pm - 7 am I am not available, please contact night coverage MD/APP via Amion

## 2020-07-29 ENCOUNTER — Inpatient Hospital Stay (HOSPITAL_COMMUNITY): Payer: Medicare Other

## 2020-07-29 ENCOUNTER — Inpatient Hospital Stay (HOSPITAL_COMMUNITY): Payer: Medicare Other | Admitting: Certified Registered Nurse Anesthetist

## 2020-07-29 ENCOUNTER — Inpatient Hospital Stay (HOSPITAL_COMMUNITY)
Admission: EM | Disposition: A | Discharge status: Skilled Nursing Facility | Payer: Self-pay | Source: Home / Self Care | Attending: Cardiothoracic Surgery

## 2020-07-29 DIAGNOSIS — I4891 Unspecified atrial fibrillation: Secondary | ICD-10-CM

## 2020-07-29 DIAGNOSIS — I2510 Atherosclerotic heart disease of native coronary artery without angina pectoris: Secondary | ICD-10-CM

## 2020-07-29 HISTORY — PX: RADIAL ARTERY HARVEST: SHX5067

## 2020-07-29 HISTORY — PX: CLIPPING OF ATRIAL APPENDAGE: SHX5773

## 2020-07-29 LAB — CBC
HCT: 34.8 % — ABNORMAL LOW (ref 39.0–52.0)
HCT: 30.3 % — ABNORMAL LOW (ref 39.0–52.0)
HCT: 33.9 % — ABNORMAL LOW (ref 39.0–52.0)
Hemoglobin: 11 g/dL — ABNORMAL LOW (ref 13.0–17.0)
Hemoglobin: 9.5 g/dL — ABNORMAL LOW (ref 13.0–17.0)
Hemoglobin: 10.7 g/dL — ABNORMAL LOW (ref 13.0–17.0)
MCH: 28.9 pg (ref 26.0–34.0)
MCH: 28.4 pg (ref 26.0–34.0)
MCH: 29.1 pg (ref 26.0–34.0)
MCHC: 31.6 g/dL (ref 30.0–36.0)
MCHC: 31.4 g/dL (ref 30.0–36.0)
MCHC: 31.6 g/dL (ref 30.0–36.0)
MCV: 91.3 fL (ref 80.0–100.0)
MCV: 90.7 fL (ref 80.0–100.0)
MCV: 92.1 fL (ref 80.0–100.0)
Platelets: 251 10*3/uL (ref 150–400)
Platelets: 123 10*3/uL — ABNORMAL LOW (ref 150–400)
Platelets: 206 10*3/uL (ref 150–400)
RBC: 3.81 MIL/uL — ABNORMAL LOW (ref 4.22–5.81)
RBC: 3.34 MIL/uL — ABNORMAL LOW (ref 4.22–5.81)
RBC: 3.68 MIL/uL — ABNORMAL LOW (ref 4.22–5.81)
RDW: 14.6 % (ref 11.5–15.5)
RDW: 14.5 % (ref 11.5–15.5)
RDW: 14.7 % (ref 11.5–15.5)
WBC: 7 10*3/uL (ref 4.0–10.5)
WBC: 11.1 10*3/uL — ABNORMAL HIGH (ref 4.0–10.5)
WBC: 17.3 10*3/uL — ABNORMAL HIGH (ref 4.0–10.5)
nRBC: 0 % (ref 0.0–0.2)
nRBC: 0 % (ref 0.0–0.2)
nRBC: 0 % (ref 0.0–0.2)

## 2020-07-29 LAB — POCT I-STAT 7, (LYTES, BLD GAS, ICA,H+H)
Acid-Base Excess: 2 mmol/L (ref 0.0–2.0)
Acid-Base Excess: 3 mmol/L — ABNORMAL HIGH (ref 0.0–2.0)
Acid-Base Excess: 3 mmol/L — ABNORMAL HIGH (ref 0.0–2.0)
Bicarbonate: 27.3 mmol/L (ref 20.0–28.0)
Bicarbonate: 27.7 mmol/L (ref 20.0–28.0)
Bicarbonate: 27.7 mmol/L (ref 20.0–28.0)
Calcium, Ion: 1.05 mmol/L — ABNORMAL LOW (ref 1.15–1.40)
Calcium, Ion: 1.06 mmol/L — ABNORMAL LOW (ref 1.15–1.40)
Calcium, Ion: 1.1 mmol/L — ABNORMAL LOW (ref 1.15–1.40)
HCT: 28 % — ABNORMAL LOW (ref 39.0–52.0)
HCT: 28 % — ABNORMAL LOW (ref 39.0–52.0)
HCT: 28 % — ABNORMAL LOW (ref 39.0–52.0)
Hemoglobin: 9.5 g/dL — ABNORMAL LOW (ref 13.0–17.0)
Hemoglobin: 9.5 g/dL — ABNORMAL LOW (ref 13.0–17.0)
Hemoglobin: 9.5 g/dL — ABNORMAL LOW (ref 13.0–17.0)
O2 Saturation: 100 %
O2 Saturation: 100 %
O2 Saturation: 100 %
Potassium: 4.1 mmol/L (ref 3.5–5.1)
Potassium: 4.7 mmol/L (ref 3.5–5.1)
Potassium: 5.1 mmol/L (ref 3.5–5.1)
Sodium: 139 mmol/L (ref 135–145)
Sodium: 135 mmol/L (ref 135–145)
Sodium: 138 mmol/L (ref 135–145)
TCO2: 29 mmol/L (ref 22–32)
TCO2: 29 mmol/L (ref 22–32)
TCO2: 29 mmol/L (ref 22–32)
pCO2 arterial: 44.2 mmHg (ref 32.0–48.0)
pCO2 arterial: 40.8 mmHg (ref 32.0–48.0)
pCO2 arterial: 41.1 mmHg (ref 32.0–48.0)
pH, Arterial: 7.398 (ref 7.350–7.450)
pH, Arterial: 7.44 (ref 7.350–7.450)
pH, Arterial: 7.437 (ref 7.350–7.450)
pO2, Arterial: 384 mmHg — ABNORMAL HIGH (ref 83.0–108.0)
pO2, Arterial: 358 mmHg — ABNORMAL HIGH (ref 83.0–108.0)
pO2, Arterial: 288 mmHg — ABNORMAL HIGH (ref 83.0–108.0)

## 2020-07-29 LAB — POCT I-STAT, CHEM 8
BUN: 6 mg/dL — ABNORMAL LOW (ref 8–23)
BUN: 6 mg/dL — ABNORMAL LOW (ref 8–23)
BUN: 6 mg/dL — ABNORMAL LOW (ref 8–23)
BUN: 5 mg/dL — ABNORMAL LOW (ref 8–23)
BUN: 6 mg/dL — ABNORMAL LOW (ref 8–23)
BUN: 5 mg/dL — ABNORMAL LOW (ref 8–23)
Calcium, Ion: 1.22 mmol/L (ref 1.15–1.40)
Calcium, Ion: 1.21 mmol/L (ref 1.15–1.40)
Calcium, Ion: 1.09 mmol/L — ABNORMAL LOW (ref 1.15–1.40)
Calcium, Ion: 1.12 mmol/L — ABNORMAL LOW (ref 1.15–1.40)
Calcium, Ion: 1.09 mmol/L — ABNORMAL LOW (ref 1.15–1.40)
Calcium, Ion: 1.31 mmol/L (ref 1.15–1.40)
Chloride: 101 mmol/L (ref 98–111)
Chloride: 102 mmol/L (ref 98–111)
Chloride: 102 mmol/L (ref 98–111)
Chloride: 100 mmol/L (ref 98–111)
Chloride: 101 mmol/L (ref 98–111)
Chloride: 103 mmol/L (ref 98–111)
Creatinine, Ser: 0.8 mg/dL (ref 0.61–1.24)
Creatinine, Ser: 0.7 mg/dL (ref 0.61–1.24)
Creatinine, Ser: 0.7 mg/dL (ref 0.61–1.24)
Creatinine, Ser: 0.6 mg/dL — ABNORMAL LOW (ref 0.61–1.24)
Creatinine, Ser: 0.7 mg/dL (ref 0.61–1.24)
Creatinine, Ser: 0.6 mg/dL — ABNORMAL LOW (ref 0.61–1.24)
Glucose, Bld: 107 mg/dL — ABNORMAL HIGH (ref 70–99)
Glucose, Bld: 130 mg/dL — ABNORMAL HIGH (ref 70–99)
Glucose, Bld: 123 mg/dL — ABNORMAL HIGH (ref 70–99)
Glucose, Bld: 121 mg/dL — ABNORMAL HIGH (ref 70–99)
Glucose, Bld: 139 mg/dL — ABNORMAL HIGH (ref 70–99)
Glucose, Bld: 127 mg/dL — ABNORMAL HIGH (ref 70–99)
HCT: 30 % — ABNORMAL LOW (ref 39.0–52.0)
HCT: 31 % — ABNORMAL LOW (ref 39.0–52.0)
HCT: 28 % — ABNORMAL LOW (ref 39.0–52.0)
HCT: 26 % — ABNORMAL LOW (ref 39.0–52.0)
HCT: 26 % — ABNORMAL LOW (ref 39.0–52.0)
HCT: 25 % — ABNORMAL LOW (ref 39.0–52.0)
Hemoglobin: 10.2 g/dL — ABNORMAL LOW (ref 13.0–17.0)
Hemoglobin: 10.5 g/dL — ABNORMAL LOW (ref 13.0–17.0)
Hemoglobin: 9.5 g/dL — ABNORMAL LOW (ref 13.0–17.0)
Hemoglobin: 8.8 g/dL — ABNORMAL LOW (ref 13.0–17.0)
Hemoglobin: 8.8 g/dL — ABNORMAL LOW (ref 13.0–17.0)
Hemoglobin: 8.5 g/dL — ABNORMAL LOW (ref 13.0–17.0)
Potassium: 3.6 mmol/L (ref 3.5–5.1)
Potassium: 4.1 mmol/L (ref 3.5–5.1)
Potassium: 5.6 mmol/L — ABNORMAL HIGH (ref 3.5–5.1)
Potassium: 4.7 mmol/L (ref 3.5–5.1)
Potassium: 5.1 mmol/L (ref 3.5–5.1)
Potassium: 4.4 mmol/L (ref 3.5–5.1)
Sodium: 139 mmol/L (ref 135–145)
Sodium: 137 mmol/L (ref 135–145)
Sodium: 137 mmol/L (ref 135–145)
Sodium: 136 mmol/L (ref 135–145)
Sodium: 135 mmol/L (ref 135–145)
Sodium: 137 mmol/L (ref 135–145)
TCO2: 25 mmol/L (ref 22–32)
TCO2: 23 mmol/L (ref 22–32)
TCO2: 27 mmol/L (ref 22–32)
TCO2: 26 mmol/L (ref 22–32)
TCO2: 25 mmol/L (ref 22–32)
TCO2: 26 mmol/L (ref 22–32)

## 2020-07-29 LAB — BLOOD GAS, ARTERIAL
Acid-Base Excess: 0.5 mmol/L (ref 0.0–2.0)
Bicarbonate: 24.3 mmol/L (ref 20.0–28.0)
Drawn by: 347621
FIO2: 21
O2 Saturation: 98.2 %
Patient temperature: 36.8
pCO2 arterial: 37.1 mmHg (ref 32.0–48.0)
pH, Arterial: 7.431 (ref 7.350–7.450)
pO2, Arterial: 98.9 mmHg (ref 83.0–108.0)

## 2020-07-29 LAB — GLUCOSE, CAPILLARY
Glucose-Capillary: 112 mg/dL — ABNORMAL HIGH (ref 70–99)
Glucose-Capillary: 111 mg/dL — ABNORMAL HIGH (ref 70–99)
Glucose-Capillary: 136 mg/dL — ABNORMAL HIGH (ref 70–99)
Glucose-Capillary: 156 mg/dL — ABNORMAL HIGH (ref 70–99)
Glucose-Capillary: 152 mg/dL — ABNORMAL HIGH (ref 70–99)
Glucose-Capillary: 160 mg/dL — ABNORMAL HIGH (ref 70–99)

## 2020-07-29 LAB — PROTIME-INR
INR: 1.2 (ref 0.8–1.2)
INR: 1.7 — ABNORMAL HIGH (ref 0.8–1.2)
Prothrombin Time: 14.8 seconds (ref 11.4–15.2)
Prothrombin Time: 19.2 seconds — ABNORMAL HIGH (ref 11.4–15.2)

## 2020-07-29 LAB — BASIC METABOLIC PANEL
Anion gap: 9 (ref 5–15)
Anion gap: 11 (ref 5–15)
BUN: 7 mg/dL — ABNORMAL LOW (ref 8–23)
BUN: 6 mg/dL — ABNORMAL LOW (ref 8–23)
CO2: 26 mmol/L (ref 22–32)
CO2: 19 mmol/L — ABNORMAL LOW (ref 22–32)
Calcium: 8.5 mg/dL — ABNORMAL LOW (ref 8.9–10.3)
Calcium: 8.2 mg/dL — ABNORMAL LOW (ref 8.9–10.3)
Chloride: 102 mmol/L (ref 98–111)
Chloride: 108 mmol/L (ref 98–111)
Creatinine, Ser: 1 mg/dL (ref 0.61–1.24)
Creatinine, Ser: 0.86 mg/dL (ref 0.61–1.24)
GFR calc Af Amer: >60 mL/min (ref >60)
GFR calc Af Amer: >60 mL/min (ref >60)
GFR calc non Af Amer: >60 mL/min (ref >60)
GFR calc non Af Amer: >60 mL/min (ref >60)
Glucose, Bld: 98 mg/dL (ref 70–99)
Glucose, Bld: 165 mg/dL — ABNORMAL HIGH (ref 70–99)
Potassium: 3.7 mmol/L (ref 3.5–5.1)
Potassium: 4.2 mmol/L (ref 3.5–5.1)
Sodium: 137 mmol/L (ref 135–145)
Sodium: 138 mmol/L (ref 135–145)

## 2020-07-29 LAB — PLATELET COUNT: Platelets: 162 10*3/uL (ref 150–400)

## 2020-07-29 LAB — HEMOGLOBIN AND HEMATOCRIT, BLOOD
HCT: 29.2 % — ABNORMAL LOW (ref 39.0–52.0)
Hemoglobin: 9.2 g/dL — ABNORMAL LOW (ref 13.0–17.0)

## 2020-07-29 LAB — MAGNESIUM: Magnesium: 2.9 mg/dL — ABNORMAL HIGH (ref 1.7–2.4)

## 2020-07-29 LAB — HEPARIN LEVEL (UNFRACTIONATED): Heparin Unfractionated: 0.53 IU/mL (ref 0.30–0.70)

## 2020-07-29 LAB — APTT: aPTT: 47 seconds — ABNORMAL HIGH (ref 24–36)

## 2020-07-29 SURGERY — CORONARY ARTERY BYPASS GRAFTING (CABG)
Anesthesia: General | Site: Chest

## 2020-07-29 MED ORDER — FENTANYL CITRATE (PF) 250 MCG/5ML IJ SOLN
INTRAMUSCULAR | Status: AC
  Filled 2020-07-29: qty 25

## 2020-07-29 MED ORDER — VANCOMYCIN HCL 1000 MG IV SOLR
INTRAVENOUS | Status: AC
  Filled 2020-07-29: qty 3000

## 2020-07-29 MED ORDER — TRAMADOL HCL 50 MG PO TABS
50.0000 mg | ORAL_TABLET | ORAL | Status: DC | PRN
  Administered 2020-07-31: 50 mg via ORAL
  Filled 2020-07-29 (×2): qty 1

## 2020-07-29 MED ORDER — METOPROLOL TARTRATE 25 MG/10 ML ORAL SUSPENSION: 12.5000 mg | Freq: Two times a day (BID) | ORAL | Status: DC

## 2020-07-29 MED ORDER — SODIUM CHLORIDE 0.9% FLUSH
10.0000 mL | Freq: Two times a day (BID) | INTRAVENOUS | Status: DC
  Administered 2020-07-29 – 2020-08-04 (×8): 10 mL

## 2020-07-29 MED ORDER — PROTAMINE SULFATE 10 MG/ML IV SOLN
INTRAVENOUS | Status: DC | PRN
  Administered 2020-07-29: 350 mg via INTRAVENOUS

## 2020-07-29 MED ORDER — ONDANSETRON HCL 4 MG/2ML IJ SOLN
4.0000 mg | Freq: Four times a day (QID) | INTRAMUSCULAR | Status: DC | PRN
  Administered 2020-07-31 – 2020-08-03 (×3): 4 mg via INTRAVENOUS
  Filled 2020-07-29 (×3): qty 2

## 2020-07-29 MED ORDER — 0.9 % SODIUM CHLORIDE (POUR BTL) OPTIME
TOPICAL | Status: DC | PRN
  Administered 2020-07-29: 6000 mL

## 2020-07-29 MED ORDER — SODIUM CHLORIDE (PF) 0.9 % IJ SOLN
INTRAMUSCULAR | Status: AC
  Filled 2020-07-29: qty 10

## 2020-07-29 MED ORDER — STERILE WATER FOR INJECTION IJ SOLN
INTRAMUSCULAR | Status: AC
  Filled 2020-07-29: qty 10

## 2020-07-29 MED ORDER — AMIODARONE IV BOLUS ONLY 150 MG/100ML
INTRAVENOUS | Status: DC | PRN
  Administered 2020-07-29: 150 mg via INTRAVENOUS

## 2020-07-29 MED ORDER — CHLORHEXIDINE GLUCONATE 0.12 % MT SOLN
15.0000 mL | OROMUCOSAL | Status: AC
  Administered 2020-07-29: 15 mL via OROMUCOSAL
  Filled 2020-07-29: qty 15

## 2020-07-29 MED ORDER — POTASSIUM CHLORIDE 10 MEQ/50ML IV SOLN: 10.0000 meq | INTRAVENOUS | Status: AC

## 2020-07-29 MED ORDER — AMIODARONE HCL IN DEXTROSE 360-4.14 MG/200ML-% IV SOLN
60.0000 mg/h | INTRAVENOUS | Status: AC
  Administered 2020-07-29: 60 mg/h via INTRAVENOUS

## 2020-07-29 MED ORDER — SODIUM CHLORIDE 0.9% FLUSH: 3.0000 mL | INTRAVENOUS | Status: DC | PRN

## 2020-07-29 MED ORDER — STERILE WATER FOR INJECTION IJ SOLN
INTRAMUSCULAR | Status: DC | PRN
  Administered 2020-07-29: 10 mL via INTRAVENOUS

## 2020-07-29 MED ORDER — VANCOMYCIN HCL IN DEXTROSE 1-5 GM/200ML-% IV SOLN
1000.0000 mg | Freq: Once | INTRAVENOUS | Status: AC
  Administered 2020-07-29: 1000 mg via INTRAVENOUS
  Filled 2020-07-29: qty 200

## 2020-07-29 MED ORDER — VANCOMYCIN HCL 1000 MG IV SOLR
INTRAVENOUS | Status: DC | PRN
  Administered 2020-07-29: 3000 mg via TOPICAL

## 2020-07-29 MED ORDER — METOPROLOL TARTRATE 5 MG/5ML IV SOLN
2.5000 mg | INTRAVENOUS | Status: DC | PRN
  Administered 2020-07-30: 5 mg via INTRAVENOUS
  Filled 2020-07-29 (×2): qty 5

## 2020-07-29 MED ORDER — MIDAZOLAM HCL 2 MG/2ML IJ SOLN: 2.0000 mg | INTRAMUSCULAR | Status: DC | PRN

## 2020-07-29 MED ORDER — AMIODARONE HCL IN DEXTROSE 360-4.14 MG/200ML-% IV SOLN
INTRAVENOUS | Status: DC | PRN
  Administered 2020-07-29: 60 mg/h via INTRAVENOUS

## 2020-07-29 MED ORDER — FAMOTIDINE IN NACL 20-0.9 MG/50ML-% IV SOLN
20.0000 mg | Freq: Two times a day (BID) | INTRAVENOUS | Status: AC
  Administered 2020-07-29: 20 mg via INTRAVENOUS

## 2020-07-29 MED ORDER — PROPOFOL 10 MG/ML IV BOLUS
INTRAVENOUS | Status: DC | PRN
  Administered 2020-07-29: 50 mg via INTRAVENOUS

## 2020-07-29 MED ORDER — PHENYLEPHRINE HCL-NACL 20-0.9 MG/250ML-% IV SOLN
INTRAVENOUS | Status: DC | PRN
  Administered 2020-07-29: 25 ug/min via INTRAVENOUS

## 2020-07-29 MED ORDER — DEXMEDETOMIDINE HCL IN NACL 400 MCG/100ML IV SOLN
0.0000 ug/kg/h | INTRAVENOUS | Status: DC
  Filled 2020-07-29: qty 100

## 2020-07-29 MED ORDER — LACTATED RINGERS IV SOLN: INTRAVENOUS | Status: DC | PRN

## 2020-07-29 MED ORDER — METOCLOPRAMIDE HCL 5 MG/ML IJ SOLN
10.0000 mg | Freq: Four times a day (QID) | INTRAMUSCULAR | Status: AC
  Administered 2020-07-29 – 2020-07-30 (×4): 10 mg via INTRAVENOUS
  Filled 2020-07-29 (×3): qty 2

## 2020-07-29 MED ORDER — METOPROLOL TARTRATE 12.5 MG HALF TABLET
12.5000 mg | ORAL_TABLET | Freq: Two times a day (BID) | ORAL | Status: DC
  Administered 2020-07-30 (×2): 12.5 mg via ORAL
  Filled 2020-07-29 (×2): qty 1

## 2020-07-29 MED ORDER — INSULIN REGULAR(HUMAN) IN NACL 100-0.9 UT/100ML-% IV SOLN
INTRAVENOUS | Status: DC | PRN
  Administered 2020-07-29: 1.5 [IU]/h via INTRAVENOUS

## 2020-07-29 MED ORDER — ROCURONIUM BROMIDE 10 MG/ML (PF) SYRINGE
PREFILLED_SYRINGE | INTRAVENOUS | Status: DC | PRN
  Administered 2020-07-29: 50 mg via INTRAVENOUS
  Administered 2020-07-29: 70 mg via INTRAVENOUS
  Administered 2020-07-29: 50 mg via INTRAVENOUS
  Administered 2020-07-29: 30 mg via INTRAVENOUS

## 2020-07-29 MED ORDER — NITROGLYCERIN IN D5W 200-5 MCG/ML-% IV SOLN: 7.0000 ug/min | INTRAVENOUS | Status: DC

## 2020-07-29 MED ORDER — HEPARIN SODIUM (PORCINE) 1000 UNIT/ML IJ SOLN
INTRAMUSCULAR | Status: DC | PRN
  Administered 2020-07-29: 35000 [IU] via INTRAVENOUS

## 2020-07-29 MED ORDER — MIDAZOLAM HCL (PF) 10 MG/2ML IJ SOLN
INTRAMUSCULAR | Status: AC
  Filled 2020-07-29: qty 2

## 2020-07-29 MED ORDER — LACTATED RINGERS IV SOLN: INTRAVENOUS | Status: DC

## 2020-07-29 MED ORDER — PLASMA-LYTE 148 IV SOLN
INTRAVENOUS | Status: DC | PRN
  Administered 2020-07-29: 500 mL via INTRAVASCULAR

## 2020-07-29 MED ORDER — AMIODARONE HCL IN DEXTROSE 360-4.14 MG/200ML-% IV SOLN
INTRAVENOUS | Status: AC
  Filled 2020-07-29: qty 200

## 2020-07-29 MED ORDER — SODIUM CHLORIDE 0.9 % IV SOLN: INTRAVENOUS | Status: DC

## 2020-07-29 MED ORDER — ASPIRIN 81 MG PO CHEW
324.0000 mg | CHEWABLE_TABLET | Freq: Every day | ORAL | Status: DC
  Filled 2020-07-29: qty 4

## 2020-07-29 MED ORDER — BISACODYL 5 MG PO TBEC
10.0000 mg | DELAYED_RELEASE_TABLET | Freq: Every day | ORAL | Status: DC
  Administered 2020-07-30 – 2020-08-02 (×4): 10 mg via ORAL
  Filled 2020-07-29 (×4): qty 2

## 2020-07-29 MED ORDER — INSULIN REGULAR(HUMAN) IN NACL 100-0.9 UT/100ML-% IV SOLN: INTRAVENOUS | Status: DC

## 2020-07-29 MED ORDER — PANTOPRAZOLE SODIUM 40 MG PO TBEC
40.0000 mg | DELAYED_RELEASE_TABLET | Freq: Every day | ORAL | Status: DC
  Administered 2020-07-31 – 2020-08-04 (×5): 40 mg via ORAL
  Filled 2020-07-29 (×5): qty 1

## 2020-07-29 MED ORDER — PROTAMINE SULFATE 10 MG/ML IV SOLN
INTRAVENOUS | Status: AC
  Filled 2020-07-29: qty 5

## 2020-07-29 MED ORDER — BUPIVACAINE LIPOSOME 1.3 % IJ SUSP
20.0000 mL | Freq: Once | INTRAMUSCULAR | Status: DC
  Filled 2020-07-29: qty 20

## 2020-07-29 MED ORDER — BUPIVACAINE HCL (PF) 0.5 % IJ SOLN
INTRAMUSCULAR | Status: AC
  Filled 2020-07-29: qty 30

## 2020-07-29 MED ORDER — ROCURONIUM BROMIDE 10 MG/ML (PF) SYRINGE
PREFILLED_SYRINGE | INTRAVENOUS | Status: AC
  Filled 2020-07-29: qty 10

## 2020-07-29 MED ORDER — SODIUM CHLORIDE 0.45 % IV SOLN: INTRAVENOUS | Status: DC | PRN

## 2020-07-29 MED ORDER — PROTAMINE SULFATE 10 MG/ML IV SOLN
INTRAVENOUS | Status: AC
  Filled 2020-07-29: qty 25

## 2020-07-29 MED ORDER — MAGNESIUM SULFATE 4 GM/100ML IV SOLN
4.0000 g | Freq: Once | INTRAVENOUS | Status: AC
  Administered 2020-07-29: 4 g via INTRAVENOUS
  Filled 2020-07-29: qty 100

## 2020-07-29 MED ORDER — ACETAMINOPHEN 160 MG/5ML PO SOLN: 650.0000 mg | Freq: Once | ORAL | Status: AC

## 2020-07-29 MED ORDER — MIDAZOLAM HCL 5 MG/5ML IJ SOLN
INTRAMUSCULAR | Status: DC | PRN
  Administered 2020-07-29 (×5): 2 mg via INTRAVENOUS

## 2020-07-29 MED ORDER — PLATELET POOR PLASMA OPTIME
Status: DC | PRN
  Administered 2020-07-29: 10 mL

## 2020-07-29 MED ORDER — EPINEPHRINE HCL 5 MG/250ML IV SOLN IN NS
3.0000 ug/min | INTRAVENOUS | Status: DC
  Administered 2020-07-29: 17:00:00 5 ug/min via INTRAVENOUS
  Filled 2020-07-29: qty 250

## 2020-07-29 MED ORDER — ACETAMINOPHEN 500 MG PO TABS
1000.0000 mg | ORAL_TABLET | Freq: Four times a day (QID) | ORAL | Status: AC
  Administered 2020-07-29 – 2020-08-03 (×12): 1000 mg via ORAL
  Filled 2020-07-29 (×15): qty 2

## 2020-07-29 MED ORDER — TRANEXAMIC ACID 1000 MG/10ML IV SOLN
INTRAVENOUS | Status: DC | PRN
  Administered 2020-07-29: 1.5 mg/kg/h via INTRAVENOUS

## 2020-07-29 MED ORDER — PHENYLEPHRINE HCL-NACL 20-0.9 MG/250ML-% IV SOLN: 0.0000 ug/min | INTRAVENOUS | Status: DC

## 2020-07-29 MED ORDER — ORAL CARE MOUTH RINSE: 15.0000 mL | OROMUCOSAL | Status: DC

## 2020-07-29 MED ORDER — ALBUMIN HUMAN 5 % IV SOLN
250.0000 mL | INTRAVENOUS | Status: AC | PRN
  Administered 2020-07-29 (×3): 12.5 g via INTRAVENOUS
  Filled 2020-07-29: qty 250

## 2020-07-29 MED ORDER — BISACODYL 10 MG RE SUPP: 10.0000 mg | Freq: Every day | RECTAL | Status: DC

## 2020-07-29 MED ORDER — ROCURONIUM BROMIDE 10 MG/ML (PF) SYRINGE
PREFILLED_SYRINGE | INTRAVENOUS | Status: AC
  Filled 2020-07-29: qty 20

## 2020-07-29 MED ORDER — HEMOSTATIC AGENTS (NO CHARGE) OPTIME
TOPICAL | Status: DC | PRN
  Administered 2020-07-29 (×2): 1 via TOPICAL

## 2020-07-29 MED ORDER — LIDOCAINE 2% (20 MG/ML) 5 ML SYRINGE
INTRAMUSCULAR | Status: DC | PRN
  Administered 2020-07-29: 40 mg via INTRAVENOUS

## 2020-07-29 MED ORDER — ARTIFICIAL TEARS OPHTHALMIC OINT
TOPICAL_OINTMENT | OPHTHALMIC | Status: DC | PRN
  Administered 2020-07-29: 1 via OPHTHALMIC

## 2020-07-29 MED ORDER — SODIUM CHLORIDE 0.9% FLUSH: 10.0000 mL | INTRAVENOUS | Status: DC | PRN

## 2020-07-29 MED ORDER — SODIUM CHLORIDE 0.9 % IV SOLN
1.5000 g | Freq: Two times a day (BID) | INTRAVENOUS | Status: AC
  Administered 2020-07-29 – 2020-07-31 (×4): 1.5 g via INTRAVENOUS
  Filled 2020-07-29 (×4): qty 1.5

## 2020-07-29 MED ORDER — DEXTROSE 50 % IV SOLN: 0.0000 mL | INTRAVENOUS | Status: DC | PRN

## 2020-07-29 MED ORDER — LACTATED RINGERS IV SOLN
500.0000 mL | Freq: Once | INTRAVENOUS | Status: AC | PRN
  Administered 2020-07-29: 500 mL via INTRAVENOUS

## 2020-07-29 MED ORDER — HEPARIN SODIUM (PORCINE) 1000 UNIT/ML IJ SOLN
INTRAMUSCULAR | Status: AC
  Filled 2020-07-29: qty 1

## 2020-07-29 MED ORDER — SODIUM CHLORIDE 0.9 % IV SOLN: 250.0000 mL | INTRAVENOUS | Status: DC

## 2020-07-29 MED ORDER — BUPIVACAINE LIPOSOME 1.3 % IJ SUSP
INTRAMUSCULAR | Status: DC | PRN
  Administered 2020-07-29: 50 mL

## 2020-07-29 MED ORDER — PLATELET RICH PLASMA OPTIME
Status: DC | PRN
  Administered 2020-07-29: 10 mL

## 2020-07-29 MED ORDER — AMIODARONE HCL IN DEXTROSE 360-4.14 MG/200ML-% IV SOLN
30.0000 mg/h | INTRAVENOUS | Status: DC
  Administered 2020-07-29 – 2020-07-30 (×3): 30 mg/h via INTRAVENOUS
  Filled 2020-07-29 (×3): qty 200

## 2020-07-29 MED ORDER — DEXMEDETOMIDINE HCL IN NACL 400 MCG/100ML IV SOLN
INTRAVENOUS | Status: DC | PRN
  Administered 2020-07-29: .4 ug/kg/h via INTRAVENOUS

## 2020-07-29 MED ORDER — MORPHINE SULFATE (PF) 2 MG/ML IV SOLN
1.0000 mg | INTRAVENOUS | Status: DC | PRN
  Administered 2020-07-29 – 2020-07-30 (×3): 2 mg via INTRAVENOUS
  Filled 2020-07-29 (×3): qty 1

## 2020-07-29 MED ORDER — OXYCODONE HCL 5 MG PO TABS
5.0000 mg | ORAL_TABLET | ORAL | Status: DC | PRN
  Administered 2020-07-29 – 2020-08-05 (×6): 10 mg via ORAL
  Filled 2020-07-29 (×6): qty 2

## 2020-07-29 MED ORDER — FENTANYL CITRATE (PF) 100 MCG/2ML IJ SOLN
INTRAMUSCULAR | Status: DC | PRN
Start: 2020-07-29 — End: 2020-07-29
  Administered 2020-07-29: 100 ug via INTRAVENOUS
  Administered 2020-07-29: 50 ug via INTRAVENOUS
  Administered 2020-07-29: 100 ug via INTRAVENOUS
  Administered 2020-07-29: 200 ug via INTRAVENOUS
  Administered 2020-07-29: 250 ug via INTRAVENOUS
  Administered 2020-07-29: 100 ug via INTRAVENOUS
  Administered 2020-07-29: 250 ug via INTRAVENOUS
  Administered 2020-07-29 (×2): 100 ug via INTRAVENOUS

## 2020-07-29 MED ORDER — CHLORHEXIDINE GLUCONATE CLOTH 2 % EX PADS
6.0000 | MEDICATED_PAD | Freq: Every day | CUTANEOUS | Status: DC
  Administered 2020-07-29 – 2020-08-05 (×7): 6 via TOPICAL

## 2020-07-29 MED ORDER — ASPIRIN EC 325 MG PO TBEC
325.0000 mg | DELAYED_RELEASE_TABLET | Freq: Every day | ORAL | Status: DC
  Administered 2020-07-30 – 2020-08-01 (×3): 325 mg via ORAL
  Filled 2020-07-29 (×3): qty 1

## 2020-07-29 MED ORDER — STERILE WATER FOR INJECTION IV SOLN
INTRAVENOUS | Status: DC | PRN
  Administered 2020-07-29: 30 mL

## 2020-07-29 MED ORDER — ARTIFICIAL TEARS OPHTHALMIC OINT
TOPICAL_OINTMENT | OPHTHALMIC | Status: AC
  Filled 2020-07-29: qty 3.5

## 2020-07-29 MED ORDER — ACETAMINOPHEN 650 MG RE SUPP
650.0000 mg | Freq: Once | RECTAL | Status: AC
  Administered 2020-07-29: 650 mg via RECTAL

## 2020-07-29 MED ORDER — ALBUMIN HUMAN 5 % IV SOLN: INTRAVENOUS | Status: DC | PRN

## 2020-07-29 MED ORDER — PROPOFOL 10 MG/ML IV BOLUS
INTRAVENOUS | Status: AC
  Filled 2020-07-29: qty 20

## 2020-07-29 MED ORDER — CHLORHEXIDINE GLUCONATE 0.12% ORAL RINSE (MEDLINE KIT)
15.0000 mL | Freq: Two times a day (BID) | OROMUCOSAL | Status: DC
  Administered 2020-07-29 – 2020-08-05 (×9): 15 mL via OROMUCOSAL

## 2020-07-29 MED ORDER — EPHEDRINE SULFATE 50 MG/ML IJ SOLN
INTRAMUSCULAR | Status: DC | PRN
  Administered 2020-07-29: 5 mg via INTRAVENOUS

## 2020-07-29 MED ORDER — ACETAMINOPHEN 160 MG/5ML PO SOLN: 1000.0000 mg | Freq: Four times a day (QID) | ORAL | Status: AC

## 2020-07-29 MED ORDER — LIDOCAINE 2% (20 MG/ML) 5 ML SYRINGE
INTRAMUSCULAR | Status: AC
  Filled 2020-07-29: qty 5

## 2020-07-29 MED ORDER — SODIUM CHLORIDE 0.9% FLUSH
3.0000 mL | Freq: Two times a day (BID) | INTRAVENOUS | Status: DC
  Administered 2020-07-30 – 2020-08-04 (×7): 3 mL via INTRAVENOUS

## 2020-07-29 MED ORDER — DOCUSATE SODIUM 100 MG PO CAPS
200.0000 mg | ORAL_CAPSULE | Freq: Every day | ORAL | Status: DC
  Administered 2020-07-30 – 2020-08-02 (×4): 200 mg via ORAL
  Filled 2020-07-29 (×4): qty 2

## 2020-07-29 SURGICAL SUPPLY — 102 items
ADAPTER CARDIO PERF ANTE/RETRO (ADAPTER) ×5 IMPLANT
ADPR PRFSN 84XANTGRD RTRGD (ADAPTER) ×4
ATRICLIP EXCLUSION VLAA 45 (Miscellaneous) ×5 IMPLANT
BAG DECANTER FOR FLEXI CONT (MISCELLANEOUS) ×5 IMPLANT
BLADE CLIPPER SURG (BLADE) ×10 IMPLANT
BLADE STERNUM SYSTEM 6 (BLADE) ×5 IMPLANT
BLADE SURG 15 STRL LF DISP TIS (BLADE) ×4 IMPLANT
BLADE SURG 15 STRL SS (BLADE) ×5
BNDG ELASTIC 4X5.8 VLCR STR LF (GAUZE/BANDAGES/DRESSINGS) ×5 IMPLANT
BNDG GAUZE ELAST 4 BULKY (GAUZE/BANDAGES/DRESSINGS) ×5 IMPLANT
CANISTER SUCT 3000ML PPV (MISCELLANEOUS) ×5 IMPLANT
CANN PRFSN 3/8XRT ANG TPR 14 (MISCELLANEOUS) ×8
CANNULA NON VENT 22FR 12 (CANNULA) ×5 IMPLANT
CANNULA PRFSN 3/8XRT ANG TPR14 (MISCELLANEOUS) ×8 IMPLANT
CANNULA VEN MTL TIP RT (MISCELLANEOUS) ×10
CATH CPB KIT HENDRICKSON (MISCELLANEOUS) ×5 IMPLANT
CATH ROBINSON RED A/P 18FR (CATHETERS) ×20 IMPLANT
CLAMP ISOLATOR SYNERGY LG (MISCELLANEOUS) ×5 IMPLANT
CONN 1/2X1/2X1/2  BEN (MISCELLANEOUS) ×5
CONN 1/2X1/2X1/2 BEN (MISCELLANEOUS) ×4 IMPLANT
CONN ST 1/4X3/8  BEN (MISCELLANEOUS) ×15
CONN ST 1/4X3/8 BEN (MISCELLANEOUS) ×12 IMPLANT
COVER MAYO STAND STRL (DRAPES) ×5 IMPLANT
DEVICE EXCLUSIN ATRCLP VLAA 45 (Miscellaneous) ×4 IMPLANT
DRAIN CHANNEL 28F RND 3/8 FF (WOUND CARE) ×15 IMPLANT
DRAPE CARDIOVASCULAR INCISE (DRAPES) ×5
DRAPE EXTREMITY T 121X128X90 (DISPOSABLE) ×5 IMPLANT
DRAPE HALF SHEET 40X57 (DRAPES) ×5 IMPLANT
DRAPE SLUSH/WARMER DISC (DRAPES) ×5 IMPLANT
DRAPE SRG 135X102X78XABS (DRAPES) ×4 IMPLANT
DRESSING PEEL AND PLC PRVNA 13 (GAUZE/BANDAGES/DRESSINGS) ×4 IMPLANT
DRSG AQUACEL AG ADV 3.5X14 (GAUZE/BANDAGES/DRESSINGS) ×5 IMPLANT
DRSG PEEL AND PLACE PREVENA 13 (GAUZE/BANDAGES/DRESSINGS) ×5
ELECT BLADE 4.0 EZ CLEAN MEGAD (MISCELLANEOUS) ×5
ELECT CAUTERY BLADE 6.4 (BLADE) ×5 IMPLANT
ELECT REM PT RETURN 9FT ADLT (ELECTROSURGICAL) ×10
ELECTRODE BLDE 4.0 EZ CLN MEGD (MISCELLANEOUS) ×4 IMPLANT
ELECTRODE REM PT RTRN 9FT ADLT (ELECTROSURGICAL) ×8 IMPLANT
FELT TEFLON 1X6 (MISCELLANEOUS) ×5 IMPLANT
GAUZE SPONGE 4X4 12PLY STRL LF (GAUZE/BANDAGES/DRESSINGS) ×10 IMPLANT
GEL ULTRASOUND 20GR AQUASONIC (MISCELLANEOUS) ×5 IMPLANT
GLOVE BIO SURGEON STRL SZ 6.5 (GLOVE) ×30 IMPLANT
GLOVE BIO SURGEON STRL SZ8 (GLOVE) ×15 IMPLANT
GLOVE BIOGEL PI IND STRL 9 (GLOVE) ×4 IMPLANT
GLOVE BIOGEL PI INDICATOR 9 (GLOVE) ×1
GLOVE NEODERM STRL 7.5  LF PF (GLOVE) ×6
GLOVE NEODERM STRL 7.5 LF PF (GLOVE) ×12 IMPLANT
GLOVE SURG NEODERM 7.5  LF PF (GLOVE) ×3
GOWN STRL REUS W/ TWL LRG LVL3 (GOWN DISPOSABLE) ×40 IMPLANT
GOWN STRL REUS W/TWL LRG LVL3 (GOWN DISPOSABLE) ×50
INSERT FOGARTY XLG (MISCELLANEOUS) ×5 IMPLANT
INSERT SUTURE HOLDER (MISCELLANEOUS) ×5 IMPLANT
KIT APPLICATOR RATIO 11:1 (KITS) ×5 IMPLANT
KIT BASIN OR (CUSTOM PROCEDURE TRAY) ×5 IMPLANT
KIT SUCTION CATH 14FR (SUCTIONS) ×10 IMPLANT
KIT TURNOVER KIT B (KITS) ×5 IMPLANT
NEEDLE 18GX1X1/2 (RX/OR ONLY) (NEEDLE) ×5 IMPLANT
NS IRRIG 1000ML POUR BTL (IV SOLUTION) ×25 IMPLANT
PACK E OPEN HEART (SUTURE) ×5 IMPLANT
PACK OPEN HEART (CUSTOM PROCEDURE TRAY) ×5 IMPLANT
PACK PLATELET PROCEDURE 60 (MISCELLANEOUS) ×5 IMPLANT
PACK SPY-PHI (KITS) ×5 IMPLANT
PAD ARMBOARD 7.5X6 YLW CONV (MISCELLANEOUS) ×10 IMPLANT
PAD ELECT DEFIB RADIOL ZOLL (MISCELLANEOUS) ×5 IMPLANT
PENCIL BUTTON HOLSTER BLD 10FT (ELECTRODE) ×10 IMPLANT
POSITIONER HEAD DONUT 9IN (MISCELLANEOUS) ×5 IMPLANT
POWDER SURGICEL 3.0 GRAM (HEMOSTASIS) ×5 IMPLANT
PROBE CRYO2-ABLATION MALLABLE (MISCELLANEOUS) ×5 IMPLANT
PUNCH AORTIC ROTATE  4.5MM 8IN (MISCELLANEOUS) ×5 IMPLANT
SEALANT SURG COSEAL 8ML (VASCULAR PRODUCTS) ×5 IMPLANT
SET CARDIOPLEGIA MPS 5001102 (MISCELLANEOUS) ×5 IMPLANT
SHEARS HARMONIC 9CM CVD (BLADE) ×5 IMPLANT
STAPLER VISISTAT 35W (STAPLE) ×10 IMPLANT
SUPPORT HEART JANKE-BARRON (MISCELLANEOUS) ×5 IMPLANT
SUT BONE WAX W31G (SUTURE) ×5 IMPLANT
SUT ETHIBOND X763 2 0 SH 1 (SUTURE) ×10 IMPLANT
SUT MNCRL AB 3-0 PS2 18 (SUTURE) ×10 IMPLANT
SUT PDS AB 1 CTX 36 (SUTURE) ×10 IMPLANT
SUT PROLENE 3 0 SH DA (SUTURE) ×30 IMPLANT
SUT PROLENE 6 0 C 1 30 (SUTURE) ×15 IMPLANT
SUT PROLENE 7 0 BV 1 (SUTURE) ×15 IMPLANT
SUT PROLENE 8 0 BV175 6 (SUTURE) ×5 IMPLANT
SUT PROLENE BLUE 7 0 (SUTURE) ×5 IMPLANT
SUT SILK  1 MH (SUTURE) ×10
SUT SILK 1 MH (SUTURE) ×8 IMPLANT
SUT STEEL 6MS V (SUTURE) ×5 IMPLANT
SUT STEEL SZ 6 DBL 3X14 BALL (SUTURE) ×5 IMPLANT
SUT VIC AB 2-0 CT1 27 (SUTURE) ×5
SUT VIC AB 2-0 CT1 TAPERPNT 27 (SUTURE) ×4 IMPLANT
SYR 30ML LL (SYRINGE) ×5 IMPLANT
SYR 3ML LL SCALE MARK (SYRINGE) ×5 IMPLANT
SYR BULB IRRIG 60ML STRL (SYRINGE) ×5 IMPLANT
SYSTEM SAHARA CHEST DRAIN ATS (WOUND CARE) ×10 IMPLANT
TAPE CLOTH SURG 4X10 WHT LF (GAUZE/BANDAGES/DRESSINGS) ×5 IMPLANT
TAPE UMBILICAL COTTON 1/8X30 (MISCELLANEOUS) ×5 IMPLANT
TIP DUAL SPRAY TOPICAL (TIP) ×10 IMPLANT
TOWEL GREEN STERILE (TOWEL DISPOSABLE) ×5 IMPLANT
TOWEL GREEN STERILE FF (TOWEL DISPOSABLE) ×5 IMPLANT
TRAY FOLEY SLVR 16FR TEMP STAT (SET/KITS/TRAYS/PACK) ×5 IMPLANT
UNDERPAD 30X36 HEAVY ABSORB (UNDERPADS AND DIAPERS) ×10 IMPLANT
WATER STERILE IRR 1000ML POUR (IV SOLUTION) ×10 IMPLANT
WATER STERILE IRR 1000ML UROMA (IV SOLUTION) ×5 IMPLANT

## 2020-07-29 NOTE — Anesthesia Preprocedure Evaluation (Signed)
Anesthesia Evaluation  Patient identified by MRN, date of birth, ID band Patient awake    Reviewed: Allergy & Precautions, H&P , NPO status , Patient's Chart, lab work & pertinent test results  Airway Mallampati: II   Neck ROM: full    Dental   Pulmonary neg pulmonary ROS,    breath sounds clear to auscultation       Cardiovascular hypertension, + CAD  + dysrhythmias Atrial Fibrillation  Rhythm:regular Rate:Normal  Preserved LV function. Mild MR.   Neuro/Psych    GI/Hepatic   Endo/Other    Renal/GU      Musculoskeletal   Abdominal   Peds  Hematology   Anesthesia Other Findings   Reproductive/Obstetrics                             Anesthesia Physical Anesthesia Plan  ASA: III  Anesthesia Plan: General   Post-op Pain Management:    Induction: Intravenous  PONV Risk Score and Plan: 2 and Ondansetron, Dexamethasone, Midazolam and Treatment may vary due to age or medical condition  Airway Management Planned: Oral ETT  Additional Equipment: Arterial line, CVP, PA Cath, TEE and Ultrasound Guidance Line Placement  Intra-op Plan:   Post-operative Plan: Post-operative intubation/ventilation  Informed Consent: I have reviewed the patients History and Physical, chart, labs and discussed the procedure including the risks, benefits and alternatives for the proposed anesthesia with the patient or authorized representative who has indicated his/her understanding and acceptance.       Plan Discussed with: CRNA, Anesthesiologist and Surgeon  Anesthesia Plan Comments:         Anesthesia Quick Evaluation

## 2020-07-29 NOTE — H&P (Signed)
History and Physical Interval Note:  07/29/2020 7:22 AM  [name] Rice  has presented today for surgery, with the diagnosis of CAD AFIB.  The various methods of treatment have been discussed with the patient and family. After consideration of risks, benefits and other options for treatment, the patient has consented to  Procedure(s) with comments: CORONARY ARTERY BYPASS GRAFTING (CABG) (N/A) - bilateral IMA MAZE (N/A) CLIPPING OF ATRIAL APPENDAGE (N/A) RADIAL ARTERY HARVEST (Left) TRANSESOPHAGEAL ECHOCARDIOGRAM (TEE) (N/A) INDOCYANINE GREEN FLUORESCENCE IMAGING (ICG) (N/A) as a surgical intervention.  The patient's history has been reviewed, patient examined, no change in status, stable for surgery.  I have reviewed the patient's chart and labs.  Questions were answered to the patient's satisfaction.     Linden Dolin

## 2020-07-29 NOTE — Procedures (Signed)
Extubation Procedure Note  Patient Details:   Name: [name] Rice DOB: [birth year] MRN: [id number]   Airway Documentation:    Vent end date: 07/29/20 Vent end time: 2045   Evaluation  O2 sats: stable throughout Complications: No apparent complications Patient did tolerate procedure well. Bilateral Breath Sounds: Other (Comment) (coarse)   Yes   Patient extubated to 4L [place] with humidity. Positive cuff leak; no stridor noted.   NIF: -30 VC: 1L  [name] Rice [name] Rice 07/29/2020, 8:54 PM

## 2020-07-29 NOTE — Brief Op Note (Signed)
07/23/2020 - 07/29/2020  1:02 PM  PATIENT:  [name] Rice  69 y.o. male  PRE-OPERATIVE DIAGNOSIS:  CAD AFIB  POST-OPERATIVE DIAGNOSIS:  CAD AFIB  PROCEDURE:  Procedure(s) with comments:  CORONARY ARTERY BYPASS GRAFTING x 4 -LIMA to LAD -RIMA to PDA -SEQ RADIAL ARTERY to OM and RAMUS INTERMEDIATE  MAZE (N/A) -Radiofrequency Ablation and Cryothemy  CLIPPING OF ATRIAL APPENDAGE  -45 mm Atricure Flex-Clip  OPEN RADIAL ARTERY HARVEST (  TRANSESOPHAGEAL ECHOCARDIOGRAM (TEE) (N/A)  INDOCYANINE GREEN FLUORESCENCE IMAGING (ICG) (N/A)  SURGEON:  Surgeon(s) and Role:    Linden Dolin, MD - Primary  PHYSICIAN ASSISTANT: Lowella Dandy PA-C  ASSISTANTS: Benay Spice RNFA   ANESTHESIA:   general  EBL: per anes/perfusion record  BLOOD ADMINISTERED: CELLSAVER  DRAINS: Left and Right Pleural Chest Tube, Mediastinal Chest Drains   LOCAL MEDICATIONS USED:  BUPIVICAINE   SPECIMEN:  No Specimen  DISPOSITION OF SPECIMEN:  N/A  COUNTS:  YES  TOURNIQUET:  * No tourniquets in log *  DICTATION: .Dragon Dictation  PLAN OF CARE: Admit to inpatient   PATIENT DISPOSITION:  ICU - intubated and hemodynamically stable.   Delay start of Pharmacological VTE agent (>24hrs) due to surgical blood loss or risk of bleeding: yes

## 2020-07-29 NOTE — Progress Notes (Signed)
Pt has remained CP free. VS Stable-see flow sheet. Pts brother Jorja Loa took possession of his wallet and other belongings will be taken to [facility]. Report called to Anesthesia, awaiting transport to arrive to unit. Will continue to monitor. Dierdre Highman, RN

## 2020-07-29 NOTE — Hospital Course (Addendum)
History of Present Illness:  [name] is a 69 yo man with approximately 5-year history of Atrial fibrillation status post attempted cardioversion.  He was admitted approximately, 3 weeks ago with dizziness.  He had a Holter monitor placed and was discharged home.  He had another episode then last week of not feeling well.  This was relatively indistinct but it led him to call 911.  Patient was admitted to the hospital and underwent Echocardiogram demonstrating reduced ejection fraction and and a nuclear stress test which suggested perfusion defect.  This prompted left heart catheterization demonstrating multivessel coronary artery disease disease.  Given the findings on left heart catheterization consultation was cardiothoracic surgery was consulted for consideration of CABG and atrial ablation. The patient was evaluated by Dr. Vickey Sages who felt patient would be a candidate for surgical intervention.  He would require a washout due to direct thrombin inhibitors.  The risks and benefits of the procedure were explained to the patient and he was agreeable to proceed.  Hospital Course:  The patient remained chest pain free during hospitalization.  The patient was taken to the operating room on 07/29/2020.  He underwent CABG x 4 utilizing LIMA to LAD, RIMA to PDA, and Sequential Radial artery to OM and Ramus Intermediate.  He also underwent MAZE procedure with clipping of LA Appendage.  He tolerated the procedure without difficulty and was taken to the SICU in stable condition.  He was extubated the evening of surgery.  During his stay in the SICU the patient was weaned off Epinephrine as tolerated.  He was started on isordil for his radial artery graft.  He required A. Pacing and his BB was held. His chest tubes and arterial lines were removed without difficulty. Follow up CXR showed a small left pleural effusion/atelectasis, but no evidence of pneumothorax.  He was started on oral Amiodarone for his history of  Atrial Fibrillation and post MAZE procedure.  His home Xarelto was also restarted.  The patent was started on aggressive diuretic therapy with Lasix IV BID.  He was stable for transfer to the progressive care unit on 07/31/2020.  As a result ,he developed a minor elevation in his creatinine level with a peak of 1.3  Due to this his regimen was adjusted.  He developed episodes of nausea and vomiting.  His abdominal exam had little to no BS.  Abd film was obtained and showed mild gaseous dilatation of his bowels.  He was treated with reglan and his diet was advanced very slowly.  His symptoms resolved and his bowels moved without difficulty.  He is fairly deconditioned.  PT consult was obtained and recommended SNF. Ileus resolved on 9/26 with several bowel movements. TOC working on SNF placement. COVID Test done 09/28 was NEGATIVE. As discussed with Dr. Vickey Sages, patient is surgically stable for discharge to SNF.

## 2020-07-29 NOTE — Anesthesia Procedure Notes (Signed)
Procedure Name: Intubation Date/Time: 07/29/2020 8:19 AM Performed by: Modena Morrow, CRNA Pre-anesthesia Checklist: Patient identified, Emergency Drugs available, Suction available and Patient being monitored Patient Re-evaluated:Patient Re-evaluated prior to induction Oxygen Delivery Method: Circle system utilized Preoxygenation: Pre-oxygenation with 100% oxygen Induction Type: IV induction Ventilation: Mask ventilation without difficulty Laryngoscope Size: Miller and 3 Grade View: Grade I Tube type: Oral Tube size: 8.0 mm Number of attempts: 1 Airway Equipment and Method: Stylet and Oral airway Placement Confirmation: ETT inserted through vocal cords under direct vision,  positive ETCO2 and breath sounds checked- equal and bilateral Secured at: 24 cm Tube secured with: Tape Dental Injury: Teeth and Oropharynx as per pre-operative assessment

## 2020-07-29 NOTE — Anesthesia Procedure Notes (Signed)
Central Venous Catheter Insertion Performed by: Albertha Ghee, MD, anesthesiologist Start/End9/21/2021 6:56 AM, 07/29/2020 7:10 AM Patient location: Pre-op. Preanesthetic checklist: patient identified, IV checked, site marked, risks and benefits discussed, surgical consent, monitors and equipment checked, pre-op evaluation, timeout performed and anesthesia consent Position: Trendelenburg Lidocaine 1% used for infiltration and patient sedated Hand hygiene performed , maximum sterile barriers used  and Seldinger technique used Catheter size: 9 Fr Central line and PA cath was placed.MAC introducer Swan type:thermodilation Procedure performed using ultrasound guided technique. Ultrasound Notes:anatomy identified, needle tip was noted to be adjacent to the nerve/plexus identified, no ultrasound evidence of intravascular and/or intraneural injection and image(s) printed for medical record Attempts: 1 Following insertion, line sutured, dressing applied and Biopatch. Post procedure assessment: blood return through all ports, free fluid flow and no air  Patient tolerated the procedure well with no immediate complications.

## 2020-07-29 NOTE — Transfer of Care (Signed)
Immediate Anesthesia Transfer of Care Note  Patient: [name] Rice  Procedure(s) Performed: CORONARY ARTERY BYPASS GRAFTING (CABG) TIMES FOUR USING BILATERAL INTERNAL MAMMARIES AND LEFT RADIAL ARTERY (N/A Chest) MAZE (N/A ) CLIPPING OF ATRIAL APPENDAGE (Left Chest) RADIAL ARTERY HARVEST (Left Arm Lower) TRANSESOPHAGEAL ECHOCARDIOGRAM (TEE) (N/A ) INDOCYANINE GREEN FLUORESCENCE IMAGING (ICG) (N/A )  Patient Location: ICU  Anesthesia Type:General  Level of Consciousness: Patient remains intubated per anesthesia plan  Airway & Oxygen Therapy: Patient remains intubated per anesthesia plan and Patient placed on Ventilator (see vital sign flow sheet for setting)  Post-op Assessment: Report given to RN and Post -op Vital signs reviewed and stable  Post vital signs: Reviewed and stable  Last Vitals:  Vitals Value Taken Time  BP    Temp    Pulse 80 07/29/20 1444  Resp 17 07/29/20 1444  SpO2 100 % 07/29/20 1444  Vitals shown include unvalidated device data.  Last Pain:  Vitals:   07/29/20 0454  TempSrc: Oral  PainSc:       Patients Stated Pain Goal: 0 (07/28/20 0800)  Complications: No complications documented.

## 2020-07-29 NOTE — Anesthesia Procedure Notes (Signed)
Arterial Line Insertion Start/End9/21/2021 7:15 AM, 07/29/2020 7:15 AM Performed by: Modena Morrow, CRNA, CRNA  Patient location: Pre-op. Preanesthetic checklist: patient identified, IV checked, site marked, risks and benefits discussed, surgical consent, monitors and equipment checked, pre-op evaluation, timeout performed and anesthesia consent Lidocaine 1% used for infiltration Right, radial was placed Catheter size: 20 Fr Hand hygiene performed  and maximum sterile barriers used   Attempts: 1 Procedure performed without using ultrasound guided technique. Following insertion, dressing applied and Biopatch. Post procedure assessment: normal and unchanged  Patient tolerated the procedure well with no immediate complications.

## 2020-07-29 NOTE — Progress Notes (Signed)
Late Entry  Patient with consistent low CI - 1.2-1.4. Administered total 500 mls albumin and LR bolus. Patient is otherwise stable. Dr. Vickey Sages notified; new orders received for 66mcg/min epi. About 15 mins after starting epi, patient's BP drastically increased to 170/90's. Nitro increased to support this. Dr. Cliffton Asters doing evening rounds; new orders to decrease epi to 43mcg/min. Next CI 1.9. Ok to wean to extubate per Dr. Cliffton Asters.  Leanna Battles, RN

## 2020-07-29 NOTE — Progress Notes (Signed)
PROGRESS NOTE  [name] Rice KDX:[id number] DOB: 07/27/1951 DOA: 07/23/2020 PCP: [name], PA-C   LOS: 5 days   Brief Narrative / Interim history: 69 year old male with history of A. fib on Xarelto, left bundle branch block, chronic systolic CHF with an EF of 45% back in February 2020, comes to the hospital with lightheadedness.  He was evaluated as an outpatient by his cardiologist, and due to concern for arrhythmias a Holter monitor was placed.  His metoprolol was also discontinued.  He was admitted to the hospital, cardiology was consulted and he underwent a cardiac cath on 9/16 with recommendations for CABG  Subjective / 24h Interval events: Feeling good this morning, awaiting surgery. No chest pain or palpitations overnight.   Assessment & Plan: Principal Problem Coronary artery disease -given arrhythmia and risk factors as above underwent on 9/17 which showed significant multivessel disease.  Cardiothoracic surgery consulted, plans in place for CABG today  Active Problems Lightheadedness/presyncope due to bradycardia, frequent pauses-appreciate cardiology consultation.  The 14-day extended Holter monitor had significant events with heart rate varying 25-197 bpm's, several pauses more than 3 seconds as well as couple of pauses of around 9 seconds.  There are also reported NSVT.  -He continues to have pauses 4-5 seconds here as well, asymptomatic, not associated with hypotension -Hold all AV nodal blocking agents, continue to monitor  Chronic atrial fibrillation - Xarelto has been held he was started on IV heparin continue to hold beta-blockers.  He will have Maze procedure as well as an LA clip during surgery today   Essential hypertension with chronic kidney disease stage IIIa -Holding all antihypertensive medication.  Acute kidney injury on chronic kidney disease stage IIIa -creatinine as high as 1.7.  Creatinine has currently normalized  Hypercholesterolemia -Continue  statins.  Hypokalemia -Replete orally.  Diarrhea -Of unclear etiology C. difficile PCR is negative. Started on Metamucil and Imodium.  Scheduled Meds: . aspirin EC  81 mg Oral Daily  . bisacodyl  5 mg Oral Once  . epinephrine  0-10 mcg/min Intravenous To OR  . heparin-papaverine-plasmalyte irrigation   Irrigation To OR  . influenza vaccine adjuvanted  0.5 mL Intramuscular Tomorrow-1000  . insulin   Intravenous To OR  . magnesium sulfate  40 mEq Other To OR  . phenylephrine  30-200 mcg/min Intravenous To OR  . potassium chloride  80 mEq Other To OR  . psyllium  1 packet Oral Daily  . rosuvastatin  40 mg Oral Daily  . sodium chloride flush  3 mL Intravenous Q12H  . sodium chloride flush  3 mL Intravenous Q12H  . tranexamic acid  15 mg/kg Intravenous To OR  . tranexamic acid  2 mg/kg Intracatheter To OR   Continuous Infusions: . sodium chloride    . cefUROXime (ZINACEF)  IV    . cefUROXime (ZINACEF)  IV    . dexmedetomidine    . heparin 30,000 units/NS 1000 mL solution for CELLSAVER    . heparin Stopped (07/29/20 0535)  . milrinone    . nitroGLYCERIN    . norepinephrine    . tranexamic acid (CYKLOKAPRON) infusion ([facility])    . vancomycin     PRN Meds:.sodium chloride, acetaminophen **OR** acetaminophen, diphenhydrAMINE, HYDROcodone-acetaminophen, loperamide, ondansetron **OR** ondansetron (ZOFRAN) IV  Diet Orders (From admission, onward)    Start     Ordered   07/29/20 0001  Diet NPO time specified  Diet effective midnight        07/28/20 1003  DVT prophylaxis:      Code Status: Full Code  Family Communication: brother Tim at bedside    Status is: Inpatient  Remains inpatient appropriate because:Ongoing diagnostic testing needed not appropriate for outpatient work up   Dispo: The patient is from: Home              Anticipated d/c is to: Home              Anticipated d/c date is: > 3 days              Patient currently is not medically stable to  d/c.  Consultants:  Cardiology  Thoracic surgery   Procedures:  Cardiac cath 9/17  Left Heart Catheterization 07/25/20:  RCA: Proximal RCA 80% stenosis, large vessel with mild disease in PL and PDA branches.  A secondary PL branch is subtotally occluded. LM: Distal 30-40% stenosis. LAD: LAD diffusely diseased, proximal diffuse 80% followed by tandem 90% stenosis.  Large D1 with moderate diffuse disease and proximal and mid tandem 70 and 80% stenosis.  Has secondary branches and is tortuous. Cx  and RI: Co-dominant Cx with Ostial circumflex 99% involving a moderate sized ramus with ostial 80 to 90% and proximal 90% stenosis. LV: Normal LVEDP.  No pressure gradient across the aortic valve. Patent LIMA and RIMA and RIMA has ostial 20-30% stenosis. Subclavian arteries widely patent.  Right radial diffusely disease by [place]. 56mL contrast used.   Microbiology  None   Antimicrobials: None     Objective: Vitals:   07/28/20 1826 07/28/20 2011 07/29/20 0454 07/29/20 0516  BP: 140/88 127/82 123/79   Pulse:  83 73   Resp: 16 18    Temp: 98 F (36.7 C) 98.7 F (37.1 C) 98.3 F (36.8 C)   TempSrc: Oral Oral Oral   SpO2: 100% 98% 98%   Weight:    116.5 kg  Height:        Intake/Output Summary (Last 24 hours) at 07/29/2020 0622 Last data filed at 07/29/2020 0535 Gross per 24 hour  Intake 1104.12 ml  Output 900 ml  Net 204.12 ml   Filed Weights   07/27/20 0557 07/28/20 0557 07/29/20 0516  Weight: 117.8 kg 117.1 kg 116.5 kg    Examination:  Constitutional: no distress    Data Reviewed: I have independently reviewed following labs and imaging studies   CBC: Recent Labs  Lab 07/24/20 1122 07/24/20 1122 07/25/20 0422 07/26/20 0515 07/27/20 0201 07/28/20 0241 07/29/20 0257  WBC 8.2   < > 6.2 7.9 7.0 7.2 7.0  NEUTROABS 5.5  --   --   --   --   --   --   HGB 12.8*   < > 11.4* 11.4* 11.3* 12.0* 11.0*  HCT 42.7   < > 36.5* 36.4* 35.5* 37.7* 34.8*  MCV 94.5   < > 92.4 90.3  90.6 91.1 91.3  PLT 295   < > 285 267 251 226 251   < > = values in this interval not displayed.   Basic Metabolic Panel: Recent Labs  Lab 07/24/20 0210 07/24/20 0748 07/24/20 1122 07/25/20 0422 07/26/20 0515 07/27/20 0201 07/28/20 0241 07/29/20 0257  NA 137   < >  --  138 137 137 137 137  K 3.5   < >  --  3.8 3.8 3.3* 3.6 3.7  CL 108   < >  --  112* 111 108 106 102  CO2 18*   < >  --  19*  19* 20* 21* 26  GLUCOSE 104*   < >  --  86 96 96 94 98  BUN 17   < >  --  11 6* 6* 6* 7*  CREATININE 1.74*   < >  --  1.07 0.91 1.05 0.98 1.00  CALCIUM 8.3*   < >  --  8.3* 8.5* 8.3* 8.5* 8.5*  MG 1.6*  --  1.7  --  1.7  --   --   --   PHOS 2.8  --  2.8  --   --   --   --   --    < > = values in this interval not displayed.   Liver Function Tests: Recent Labs  Lab 07/23/20 1221 07/24/20 0748 07/26/20 0515 07/27/20 0201  AST 48* 34 22 21  ALT 36 32 21 21  ALKPHOS 166* 163* 120 105  BILITOT 2.0* 1.9* 1.7* 1.4*  PROT 6.6 6.0* 6.0* 5.7*  ALBUMIN 2.9* 2.7* 2.5* 2.4*   Coagulation Profile: Recent Labs  Lab 07/29/20 0257  INR 1.2   HbA1C: No results for input(s): HGBA1C in the last 72 hours. CBG: Recent Labs  Lab 07/23/20 1659  GLUCAP 117*    Recent Results (from the past 240 hour(s))  SARS Coronavirus 2 by RT PCR (hospital order, performed in [facility] lab) Nasopharyngeal Nasopharyngeal Swab     Status: None   Collection Time: 07/23/20 11:46 PM   Specimen: Nasopharyngeal Swab  Result Value Ref Range Status   SARS Coronavirus 2 NEGATIVE NEGATIVE Final    Comment: (NOTE) SARS-CoV-2 target nucleic acids are NOT DETECTED.  The SARS-CoV-2 RNA is generally detectable in upper and lower respiratory specimens during the acute phase of infection. The lowest concentration of SARS-CoV-2 viral copies this assay can detect is 250 copies / mL. A negative result does not preclude SARS-CoV-2 infection and should not be used as the sole basis for treatment or other patient  management decisions.  A negative result may occur with improper specimen collection / handling, submission of specimen other than nasopharyngeal swab, presence of viral mutation(s) within the areas targeted by this assay, and inadequate number of viral copies (<250 copies / mL). A negative result must be combined with clinical observations, patient history, and epidemiological information.  Fact Sheet for Patients:   BoilerBrush.com.cyhttps://www.fda.gov/media/136312/download  Fact Sheet for Healthcare Providers: https://pope.com/https://www.fda.gov/media/136313/download  This test is not yet approved or  cleared by the [place] FDA and has been authorized for detection and/or diagnosis of SARS-CoV-2 by FDA under an Emergency Use Authorization (EUA).  This EUA will remain in effect (meaning this test can be used) for the duration of the COVID-19 declaration under Section 564(b)(1) of the Act, 21 U.S.C. section 360bbb-3(b)(1), unless the authorization is terminated or revoked sooner.  Performed at [facility], [address] [place], [place] [address]   C Difficile Quick Screen w PCR reflex     Status: None   Collection Time: 07/24/20 12:42 AM   Specimen: STOOL  Result Value Ref Range Status   C Diff antigen NEGATIVE NEGATIVE Final   C Diff toxin NEGATIVE NEGATIVE Final   C Diff interpretation No C. difficile detected.  Final    Comment: Performed at [facility], [address] [place], [place] [address]  Gastrointestinal Panel by PCR , Stool     Status: None   Collection Time: 07/24/20 12:42 AM   Specimen: Stool  Result Value Ref Range Status  Campylobacter species NOT DETECTED NOT DETECTED Final   Plesimonas shigelloides NOT DETECTED NOT DETECTED Final   Salmonella species NOT DETECTED NOT DETECTED Final   Yersinia enterocolitica NOT DETECTED NOT DETECTED Final   Vibrio species NOT DETECTED NOT DETECTED Final   Vibrio cholerae NOT DETECTED NOT DETECTED Final   Enteroaggregative E coli  (EAEC) NOT DETECTED NOT DETECTED Final   Enteropathogenic E coli (EPEC) NOT DETECTED NOT DETECTED Final   Enterotoxigenic E coli (ETEC) NOT DETECTED NOT DETECTED Final   Shiga like toxin producing E coli (STEC) NOT DETECTED NOT DETECTED Final   Shigella/Enteroinvasive E coli (EIEC) NOT DETECTED NOT DETECTED Final   Cryptosporidium NOT DETECTED NOT DETECTED Final   Cyclospora cayetanensis NOT DETECTED NOT DETECTED Final   Entamoeba histolytica NOT DETECTED NOT DETECTED Final   Giardia lamblia NOT DETECTED NOT DETECTED Final   Adenovirus F40/41 NOT DETECTED NOT DETECTED Final   Astrovirus NOT DETECTED NOT DETECTED Final   Norovirus GI/GII NOT DETECTED NOT DETECTED Final   Rotavirus A NOT DETECTED NOT DETECTED Final   Sapovirus (I, II, IV, and V) NOT DETECTED NOT DETECTED Final    Comment: Performed at [facility], [address], [place], [place] [address]  Surgical pcr screen     Status: None   Collection Time: 07/28/20 11:12 AM   Specimen: Nasal Mucosa; Nasal Swab  Result Value Ref Range Status   MRSA, PCR NEGATIVE NEGATIVE Final   Staphylococcus aureus NEGATIVE NEGATIVE Final    Comment: (NOTE) The Xpert SA Assay (FDA approved for NASAL specimens in patients 3 years of age and older), is one component of a comprehensive surveillance program. It is not intended to diagnose infection nor to guide or monitor treatment. Performed at [facility], [address] [place], [place] [address]      Radiology Studies: DG CHEST PORT 1 VIEW  Result Date: 07/28/2020 CLINICAL DATA:  Preop for heart surgery. EXAM: PORTABLE CHEST 1 VIEW COMPARISON:  Chest x-ray 07/02/2020 FINDINGS: The heart size and mediastinal contours are unchanged. Aortic arch calcification. Well-defined stable 7 mm right lower lung zone pulmonary nodule consistent with known basilar granuloma. No focal consolidation. No pulmonary edema. No pleural effusion. No pneumothorax. No acute osseous abnormality.  Multilevel degenerative changes of the spine. IMPRESSION: No active cardiopulmonary disease. Electronically Signed   By: Tish Frederickson M.D.   On: 07/28/2020 21:06   Pamella Pert, MD, PhD [facility]  Between 7 am - 7 pm I am available, please contact me via Amion or Securechat  Between 7 pm - 7 am I am not available, please contact night coverage MD/APP via Amion

## 2020-07-29 NOTE — Progress Notes (Signed)
EVENING ROUNDS NOTE :     [address]       [name] [address]             [phone]                 Day of Surgery Procedure(s) (LRB): CORONARY ARTERY BYPASS GRAFTING (CABG) TIMES FOUR USING BILATERAL INTERNAL MAMMARIES AND LEFT RADIAL ARTERY (N/A) MAZE (N/A) CLIPPING OF ATRIAL APPENDAGE (Left) RADIAL ARTERY HARVEST (Left) TRANSESOPHAGEAL ECHOCARDIOGRAM (TEE) (N/A) INDOCYANINE GREEN FLUORESCENCE IMAGING (ICG) (N/A)   Total Length of Stay:  LOS: 5 days  Events:   Hypertensive on epi.  Index remains low Will continue to follow    BP 124/64   Pulse 86   Temp (!) 96.8 F (36 C)   Resp 19   Ht 6\' 1"  (1.854 m)   Wt 116.5 kg   SpO2 100%   BMI 33.89 kg/m   PAP: (30-39)/(16-22) 39/22 CO:  [2.8 L/min-3.5 L/min] 2.8 L/min CI:  [1.2 L/min/m2-1.5 L/min/m2] 1.2 L/min/m2  Vent Mode: SIMV;PRVC;PSV FiO2 (%):  [50 %] 50 % Set Rate:  [12 bmp] 12 bmp Vt Set:  [630 mL] 630 mL PEEP:  [5 cmH20] 5 cmH20 Pressure Support:  [10 cmH20] 10 cmH20 Plateau Pressure:  [17 cmH20] 17 cmH20  . sodium chloride 20 mL/hr at 07/29/20 1700  . [START ON 07/30/2020] sodium chloride    . sodium chloride 10 mL/hr at 07/29/20 1456  . albumin human 12.5 g (07/29/20 1530)  . amiodarone 60 mg/hr (07/29/20 1700)  . amiodarone 60 mg/hr (07/29/20 1511)  . cefUROXime (ZINACEF)  IV 1.5 g (07/29/20 1711)  . dexmedetomidine (PRECEDEX) IV infusion 0.5 mcg/kg/hr (07/29/20 1700)  . epinephrine 5 mcg/min (07/29/20 1710)  . famotidine (PEPCID) IV Stopped (07/29/20 1510)  . insulin 1 mL/hr at 07/29/20 1700  . lactated ringers    . lactated ringers 20 mL/hr at 07/29/20 1700  . magnesium sulfate 20 mL/hr at 07/29/20 1700  . nitroGLYCERIN 7 mcg/min (07/29/20 1700)  . phenylephrine (NEO-SYNEPHRINE) Adult infusion 10 mcg/min (07/29/20 1700)  . potassium chloride    . vancomycin      I/O last 3 completed shifts: In: 1104.1 [P.O.:240; I.V.:864.1] Out: 1450 [Urine:1450]   CBC Latest Ref Rng &  Units 07/29/2020 07/29/2020 07/29/2020  WBC 4.0 - 10.5 K/uL 11.1(H) - -  Hemoglobin 13.0 - 17.0 g/dL 07/31/2020) 9.1(Y) 7.8(G)  Hematocrit 39 - 52 % 30.3(L) 25.0(L) 26.0(L)  Platelets 150 - 400 K/uL 123(L) - -    BMP Latest Ref Rng & Units 07/29/2020 07/29/2020 07/29/2020  Glucose 70 - 99 mg/dL 07/31/2020) 213(Y) -  BUN 8 - 23 mg/dL 5(L) 6(L) -  Creatinine 0.61 - 1.24 mg/dL 865(H) 8.46(N -  BUN/Creat Ratio 10 - 24 - - -  Sodium 135 - 145 mmol/L 137 135 138  Potassium 3.5 - 5.1 mmol/L 4.4 5.1 5.1  Chloride 98 - 111 mmol/L 103 101 -  CO2 22 - 32 mmol/L - - -  Calcium 8.9 - 10.3 mg/dL - - -    ABG    Component Value Date/Time   PHART 7.437 07/29/2020 1251   PCO2ART 41.1 07/29/2020 1251   PO2ART 288 (H) 07/29/2020 1251   HCO3 27.7 07/29/2020 1251   TCO2 26 07/29/2020 1333   O2SAT 100.0 07/29/2020 1251       07/31/2020, MD 07/29/2020 5:28 PM

## 2020-07-30 ENCOUNTER — Inpatient Hospital Stay (HOSPITAL_COMMUNITY): Payer: Medicare Other

## 2020-07-30 LAB — POCT I-STAT 7, (LYTES, BLD GAS, ICA,H+H)
Acid-base deficit: 2 mmol/L (ref 0.0–2.0)
Acid-base deficit: 7 mmol/L — ABNORMAL HIGH (ref 0.0–2.0)
Acid-base deficit: 7 mmol/L — ABNORMAL HIGH (ref 0.0–2.0)
Bicarbonate: 24.1 mmol/L (ref 20.0–28.0)
Bicarbonate: 19.4 mmol/L — ABNORMAL LOW (ref 20.0–28.0)
Bicarbonate: 18.6 mmol/L — ABNORMAL LOW (ref 20.0–28.0)
Calcium, Ion: 1.24 mmol/L (ref 1.15–1.40)
Calcium, Ion: 1.14 mmol/L — ABNORMAL LOW (ref 1.15–1.40)
Calcium, Ion: 1.12 mmol/L — ABNORMAL LOW (ref 1.15–1.40)
HCT: 28 % — ABNORMAL LOW (ref 39.0–52.0)
HCT: 30 % — ABNORMAL LOW (ref 39.0–52.0)
HCT: 28 % — ABNORMAL LOW (ref 39.0–52.0)
Hemoglobin: 9.5 g/dL — ABNORMAL LOW (ref 13.0–17.0)
Hemoglobin: 10.2 g/dL — ABNORMAL LOW (ref 13.0–17.0)
Hemoglobin: 9.5 g/dL — ABNORMAL LOW (ref 13.0–17.0)
O2 Saturation: 99 %
O2 Saturation: 99 %
O2 Saturation: 96 %
Patient temperature: 35.7
Patient temperature: 36.9
Patient temperature: 36.9
Potassium: 4.3 mmol/L (ref 3.5–5.1)
Potassium: 4 mmol/L (ref 3.5–5.1)
Potassium: 3.9 mmol/L (ref 3.5–5.1)
Sodium: 141 mmol/L (ref 135–145)
Sodium: 143 mmol/L (ref 135–145)
Sodium: 143 mmol/L (ref 135–145)
TCO2: 25 mmol/L (ref 22–32)
TCO2: 21 mmol/L — ABNORMAL LOW (ref 22–32)
TCO2: 20 mmol/L — ABNORMAL LOW (ref 22–32)
pCO2 arterial: 41.5 mmHg (ref 32.0–48.0)
pCO2 arterial: 39.9 mmHg (ref 32.0–48.0)
pCO2 arterial: 35.6 mmHg (ref 32.0–48.0)
pH, Arterial: 7.365 (ref 7.350–7.450)
pH, Arterial: 7.294 — ABNORMAL LOW (ref 7.350–7.450)
pH, Arterial: 7.327 — ABNORMAL LOW (ref 7.350–7.450)
pO2, Arterial: 126 mmHg — ABNORMAL HIGH (ref 83.0–108.0)
pO2, Arterial: 135 mmHg — ABNORMAL HIGH (ref 83.0–108.0)
pO2, Arterial: 87 mmHg (ref 83.0–108.0)

## 2020-07-30 LAB — CBC
HCT: 31.1 % — ABNORMAL LOW (ref 39.0–52.0)
HCT: 30.9 % — ABNORMAL LOW (ref 39.0–52.0)
Hemoglobin: 9.9 g/dL — ABNORMAL LOW (ref 13.0–17.0)
Hemoglobin: 9.7 g/dL — ABNORMAL LOW (ref 13.0–17.0)
MCH: 29.2 pg (ref 26.0–34.0)
MCH: 28.8 pg (ref 26.0–34.0)
MCHC: 31.8 g/dL (ref 30.0–36.0)
MCHC: 31.4 g/dL (ref 30.0–36.0)
MCV: 91.7 fL (ref 80.0–100.0)
MCV: 91.7 fL (ref 80.0–100.0)
Platelets: 211 10*3/uL (ref 150–400)
Platelets: 203 10*3/uL (ref 150–400)
RBC: 3.39 MIL/uL — ABNORMAL LOW (ref 4.22–5.81)
RBC: 3.37 MIL/uL — ABNORMAL LOW (ref 4.22–5.81)
RDW: 14.8 % (ref 11.5–15.5)
RDW: 15 % (ref 11.5–15.5)
WBC: 15.4 10*3/uL — ABNORMAL HIGH (ref 4.0–10.5)
WBC: 16.5 10*3/uL — ABNORMAL HIGH (ref 4.0–10.5)
nRBC: 0 % (ref 0.0–0.2)
nRBC: 0 % (ref 0.0–0.2)

## 2020-07-30 LAB — BASIC METABOLIC PANEL
Anion gap: 9 (ref 5–15)
Anion gap: 11 (ref 5–15)
BUN: 7 mg/dL — ABNORMAL LOW (ref 8–23)
BUN: 8 mg/dL (ref 8–23)
CO2: 22 mmol/L (ref 22–32)
CO2: 19 mmol/L — ABNORMAL LOW (ref 22–32)
Calcium: 8.4 mg/dL — ABNORMAL LOW (ref 8.9–10.3)
Calcium: 8.3 mg/dL — ABNORMAL LOW (ref 8.9–10.3)
Chloride: 107 mmol/L (ref 98–111)
Chloride: 105 mmol/L (ref 98–111)
Creatinine, Ser: 1.07 mg/dL (ref 0.61–1.24)
Creatinine, Ser: 1.17 mg/dL (ref 0.61–1.24)
GFR calc Af Amer: >60 mL/min (ref >60)
GFR calc Af Amer: >60 mL/min (ref >60)
GFR calc non Af Amer: >60 mL/min (ref >60)
GFR calc non Af Amer: >60 mL/min (ref >60)
Glucose, Bld: 143 mg/dL — ABNORMAL HIGH (ref 70–99)
Glucose, Bld: 174 mg/dL — ABNORMAL HIGH (ref 70–99)
Potassium: 4.4 mmol/L (ref 3.5–5.1)
Potassium: 4.4 mmol/L (ref 3.5–5.1)
Sodium: 138 mmol/L (ref 135–145)
Sodium: 135 mmol/L (ref 135–145)

## 2020-07-30 LAB — GLUCOSE, CAPILLARY
Glucose-Capillary: 116 mg/dL — ABNORMAL HIGH (ref 70–99)
Glucose-Capillary: 118 mg/dL — ABNORMAL HIGH (ref 70–99)
Glucose-Capillary: 119 mg/dL — ABNORMAL HIGH (ref 70–99)
Glucose-Capillary: 120 mg/dL — ABNORMAL HIGH (ref 70–99)
Glucose-Capillary: 133 mg/dL — ABNORMAL HIGH (ref 70–99)
Glucose-Capillary: 135 mg/dL — ABNORMAL HIGH (ref 70–99)
Glucose-Capillary: 136 mg/dL — ABNORMAL HIGH (ref 70–99)
Glucose-Capillary: 137 mg/dL — ABNORMAL HIGH (ref 70–99)
Glucose-Capillary: 137 mg/dL — ABNORMAL HIGH (ref 70–99)
Glucose-Capillary: 139 mg/dL — ABNORMAL HIGH (ref 70–99)
Glucose-Capillary: 142 mg/dL — ABNORMAL HIGH (ref 70–99)
Glucose-Capillary: 145 mg/dL — ABNORMAL HIGH (ref 70–99)
Glucose-Capillary: 145 mg/dL — ABNORMAL HIGH (ref 70–99)
Glucose-Capillary: 155 mg/dL — ABNORMAL HIGH (ref 70–99)

## 2020-07-30 LAB — ECHO INTRAOPERATIVE TEE
Height: 73 in
Weight: 4110.4 oz

## 2020-07-30 LAB — MAGNESIUM
Magnesium: 2.6 mg/dL — ABNORMAL HIGH (ref 1.7–2.4)
Magnesium: 2.2 mg/dL (ref 1.7–2.4)

## 2020-07-30 MED ORDER — THIAMINE HCL 100 MG/ML IJ SOLN
INTRAVENOUS | Status: AC
  Filled 2020-07-30: qty 1000

## 2020-07-30 MED ORDER — ISOSORBIDE DINITRATE 10 MG PO TABS
10.0000 mg | ORAL_TABLET | Freq: Three times a day (TID) | ORAL | Status: DC
  Administered 2020-07-30 – 2020-08-05 (×20): 10 mg via ORAL
  Filled 2020-07-30 (×20): qty 1

## 2020-07-30 MED ORDER — GUAIFENESIN ER 600 MG PO TB12
600.0000 mg | ORAL_TABLET | Freq: Two times a day (BID) | ORAL | Status: DC
  Administered 2020-07-30 – 2020-08-04 (×11): 600 mg via ORAL
  Filled 2020-07-30 (×12): qty 1

## 2020-07-30 MED ORDER — FUROSEMIDE 10 MG/ML IJ SOLN
40.0000 mg | Freq: Once | INTRAMUSCULAR | Status: AC
  Administered 2020-07-30: 40 mg via INTRAVENOUS
  Filled 2020-07-30: qty 4

## 2020-07-30 MED ORDER — INSULIN ASPART 100 UNIT/ML ~~LOC~~ SOLN
0.0000 [IU] | SUBCUTANEOUS | Status: DC
  Administered 2020-07-30 – 2020-08-01 (×7): 2 [IU] via SUBCUTANEOUS

## 2020-07-30 MED ORDER — COLCHICINE 0.3 MG HALF TABLET
0.3000 mg | ORAL_TABLET | Freq: Two times a day (BID) | ORAL | Status: DC
  Administered 2020-07-30 – 2020-07-31 (×4): 0.3 mg via ORAL
  Filled 2020-07-30 (×6): qty 1

## 2020-07-30 NOTE — Plan of Care (Signed)

## 2020-07-30 NOTE — Plan of Care (Signed)
  Problem: Education: Goal: Will demonstrate proper wound care and an understanding of methods to prevent future damage Outcome: Progressing Goal: Knowledge of disease or condition will improve Outcome: Progressing Goal: Knowledge of the prescribed therapeutic regimen will improve Outcome: Progressing Goal: Individualized Educational Video(s) Outcome: Progressing   

## 2020-07-30 NOTE — Anesthesia Postprocedure Evaluation (Signed)
Anesthesia Post Note  Patient: [name] Rice  Procedure(Rice) Performed: CORONARY ARTERY BYPASS GRAFTING (CABG) TIMES FOUR USING BILATERAL INTERNAL MAMMARIES AND LEFT RADIAL ARTERY (N/A Chest) MAZE (N/A ) CLIPPING OF ATRIAL APPENDAGE (Left Chest) RADIAL ARTERY HARVEST (Left Arm Lower) TRANSESOPHAGEAL ECHOCARDIOGRAM (TEE) (N/A ) INDOCYANINE GREEN FLUORESCENCE IMAGING (ICG) (N/A )     Patient location during evaluation: SICU Anesthesia Type: General Level of consciousness: sedated Pain management: pain level controlled Vital Signs Assessment: post-procedure vital signs reviewed and stable Respiratory status: patient remains intubated per anesthesia plan Cardiovascular status: stable Postop Assessment: no apparent nausea or vomiting Anesthetic complications: no   No complications documented.  Last Vitals:  Vitals:   07/30/20 1700 07/30/20 1800  BP: 123/78   Pulse: 70 70  Resp: 20 (!) 22  Temp:  37.1 C  SpO2: 98% 98%    Last Pain:  Vitals:   07/30/20 1749  TempSrc:   PainSc: 2                  [name] Rice

## 2020-07-30 NOTE — Progress Notes (Signed)
1 Day Post-Op Procedure(s) (LRB): CORONARY ARTERY BYPASS GRAFTING (CABG) TIMES FOUR USING BILATERAL INTERNAL MAMMARIES AND LEFT RADIAL ARTERY (N/A) MAZE (N/A) CLIPPING OF ATRIAL APPENDAGE (Left) RADIAL ARTERY HARVEST (Left) TRANSESOPHAGEAL ECHOCARDIOGRAM (TEE) (N/A) INDOCYANINE GREEN FLUORESCENCE IMAGING (ICG) (N/A) Subjective: "A little sore" Objective: Vital signs in last 24 hours: Temp:  [96.1 F (35.6 C)-98.8 F (37.1 C)] 97.9 F (36.6 C) (09/22 8756) Pulse Rate:  [80-86] 85 (09/22 0613) Cardiac Rhythm: Ventricular paced (09/21 1930) Resp:  [12-75] 34 (09/22 0613) BP: (109-124)/(64-78) 124/64 (09/21 1522) SpO2:  [94 %-100 %] 98 % (09/22 4332) Arterial Line BP: (101-185)/(47-79) 159/59 (09/22 0613) FiO2 (%):  [40 %-50 %] 40 % (09/21 1949) Weight:  [123.6 kg] 123.6 kg (09/22 0455)  Hemodynamic parameters for last 24 hours: PAP: (23-50)/(10-26) 44/21 CO:  [2.8 L/min-6.1 L/min] 6.1 L/min CI:  [1.2 L/min/m2-2.5 L/min/m2] 2.5 L/min/m2  Intake/Output from previous day: 09/21 0701 - 09/22 0700 In: 7861.4 [I.V.:5026.5; Blood:585; IV Piggyback:2249.8] Out: 5565 [Urine:3975; Drains:30; Blood:1170; Chest Tube:390] Intake/Output this shift: No intake/output data recorded.  General appearance: alert and cooperative Neurologic: intact Heart: regular rate and rhythm, S1, S2 normal, no murmur, click, rub or gallop Lungs: clear to auscultation bilaterally Abdomen: soft, non-tender; bowel sounds normal; no masses,  no organomegaly Extremities: extremities normal, atraumatic, no cyanosis or edema Wound: dressed  Lab Results: Recent Labs    07/29/20 2003 07/29/20 2033 07/29/20 2140 07/30/20 0403  WBC 17.3*  --   --  15.4*  HGB 10.7*   < > 9.5* 9.9*  HCT 33.9*   < > 28.0* 31.1*  PLT 206  --   --  211   < > = values in this interval not displayed.   BMET:  Recent Labs    07/29/20 2003 07/29/20 2033 07/29/20 2140 07/30/20 0403  NA 138   < > 143 138  K 4.2   < > 3.9 4.4   CL 108  --   --  107  CO2 19*  --   --  22  GLUCOSE 165*  --   --  143*  BUN 6*  --   --  7*  CREATININE 0.86  --   --  1.07  CALCIUM 8.2*  --   --  8.4*   < > = values in this interval not displayed.    PT/INR:  Recent Labs    07/29/20 1452  LABPROT 19.2*  INR 1.7*   ABG    Component Value Date/Time   PHART 7.327 (L) 07/29/2020 2140   HCO3 18.6 (L) 07/29/2020 2140   TCO2 20 (L) 07/29/2020 2140   ACIDBASEDEF 7.0 (H) 07/29/2020 2140   O2SAT 96.0 07/29/2020 2140   CBG (last 3)  Recent Labs    07/30/20 0405 07/30/20 0502 07/30/20 0601  GLUCAP 137* 133* 136*    Assessment/Plan: S/P Procedure(s) (LRB): CORONARY ARTERY BYPASS GRAFTING (CABG) TIMES FOUR USING BILATERAL INTERNAL MAMMARIES AND LEFT RADIAL ARTERY (N/A) MAZE (N/A) CLIPPING OF ATRIAL APPENDAGE (Left) RADIAL ARTERY HARVEST (Left) TRANSESOPHAGEAL ECHOCARDIOGRAM (TEE) (N/A) INDOCYANINE GREEN FLUORESCENCE IMAGING (ICG) (N/A) Mobilize continue to resuscitate  Wean epi for CI>2 Continue to A-V pace for CI Pain control   LOS: 6 days    Linden Dolin 07/30/2020

## 2020-07-30 NOTE — Progress Notes (Signed)
Patient ID: [name] Rice, male   DOB: [birth year], 69 y.o.   MRN: [id number] EVENING ROUNDS NOTE :     [address]       [place] [address]             [phone]                 1 Day Post-Op Procedure(s) (LRB): CORONARY ARTERY BYPASS GRAFTING (CABG) TIMES FOUR USING BILATERAL INTERNAL MAMMARIES AND LEFT RADIAL ARTERY (N/A) MAZE (N/A) CLIPPING OF ATRIAL APPENDAGE (Left) RADIAL ARTERY HARVEST (Left) TRANSESOPHAGEAL ECHOCARDIOGRAM (TEE) (N/A) INDOCYANINE GREEN FLUORESCENCE IMAGING (ICG) (N/A)  Total Length of Stay:  LOS: 6 days  BP 123/78 (BP Location: Right Arm)   Pulse 71   Temp 98.8 F (37.1 C)   Resp (!) 26   Ht 6\' 1"  (1.854 m)   Wt 123.6 kg   SpO2 98%   BMI 35.95 kg/m   .Intake/Output      09/21 0701 - 09/22 0700 09/22 0701 - 09/23 0700   P.O.     I.V. (mL/kg) 5026.5 (40.7) 607.3 (4.9)   Blood 585    IV Piggyback 2249.8 42.2   Total Intake(mL/kg) 7861.4 (63.6) 649.5 (5.3)   Urine (mL/kg/hr) 3975 (1.3) 260 (0.2)   Drains 30 0   Blood 1170    Chest Tube 390 100   Total Output 5565 360   Net +2296.4 +289.5          . sodium chloride Stopped (07/30/20 0542)  . sodium chloride    . sodium chloride 10 mL/hr at 07/29/20 1456  . amiodarone 30 mg/hr (07/30/20 1400)  . cefUROXime (ZINACEF)  IV 1.5 g (07/30/20 1706)  . epinephrine Stopped (07/30/20 0907)  . banana bag IV 1000 mL 75 mL/hr at 07/30/20 1400  . lactated ringers    . lactated ringers 20 mL/hr at 07/30/20 1400  . phenylephrine (NEO-SYNEPHRINE) Adult infusion Stopped (07/30/20 0635)     Lab Results  Component Value Date   WBC 15.4 (H) 07/30/2020   HGB 9.9 (L) 07/30/2020   HCT 31.1 (L) 07/30/2020   PLT 211 07/30/2020   GLUCOSE 143 (H) 07/30/2020   CHOL 78 07/26/2020   TRIG 92 07/26/2020   HDL 24 (L) 07/26/2020   LDLCALC 36 07/26/2020   ALT 21 07/27/2020   AST 21 07/27/2020   NA 138 07/30/2020   K 4.4 07/30/2020   CL 107 07/30/2020   CREATININE 1.07 07/30/2020   BUN 7 (L)  07/30/2020   CO2 22 07/30/2020   TSH 0.941 07/24/2020   INR 1.7 (H) 07/29/2020   HGBA1C 5.2 07/26/2020   Some congestion - coughing some A paced    07/28/2020 MD  Beeper [phone] Office [phone] 07/30/2020 5:25 PM

## 2020-07-31 ENCOUNTER — Encounter (HOSPITAL_COMMUNITY): Payer: Self-pay | Admitting: Cardiothoracic Surgery

## 2020-07-31 ENCOUNTER — Inpatient Hospital Stay (HOSPITAL_COMMUNITY): Payer: Medicare Other

## 2020-07-31 LAB — GLUCOSE, CAPILLARY
Glucose-Capillary: 116 mg/dL — ABNORMAL HIGH (ref 70–99)
Glucose-Capillary: 121 mg/dL — ABNORMAL HIGH (ref 70–99)
Glucose-Capillary: 137 mg/dL — ABNORMAL HIGH (ref 70–99)
Glucose-Capillary: 140 mg/dL — ABNORMAL HIGH (ref 70–99)
Glucose-Capillary: 143 mg/dL — ABNORMAL HIGH (ref 70–99)
Glucose-Capillary: 146 mg/dL — ABNORMAL HIGH (ref 70–99)

## 2020-07-31 LAB — BASIC METABOLIC PANEL
Anion gap: 12 (ref 5–15)
BUN: 12 mg/dL (ref 8–23)
CO2: 22 mmol/L (ref 22–32)
Calcium: 8.4 mg/dL — ABNORMAL LOW (ref 8.9–10.3)
Chloride: 99 mmol/L (ref 98–111)
Creatinine, Ser: 1.04 mg/dL (ref 0.61–1.24)
GFR calc Af Amer: >60 mL/min (ref >60)
GFR calc non Af Amer: >60 mL/min (ref >60)
Glucose, Bld: 115 mg/dL — ABNORMAL HIGH (ref 70–99)
Potassium: 4.3 mmol/L (ref 3.5–5.1)
Sodium: 133 mmol/L — ABNORMAL LOW (ref 135–145)

## 2020-07-31 LAB — CBC
HCT: 29.5 % — ABNORMAL LOW (ref 39.0–52.0)
Hemoglobin: 9.2 g/dL — ABNORMAL LOW (ref 13.0–17.0)
MCH: 29.1 pg (ref 26.0–34.0)
MCHC: 31.2 g/dL (ref 30.0–36.0)
MCV: 93.4 fL (ref 80.0–100.0)
Platelets: 194 10*3/uL (ref 150–400)
RBC: 3.16 MIL/uL — ABNORMAL LOW (ref 4.22–5.81)
RDW: 15 % (ref 11.5–15.5)
WBC: 16.1 10*3/uL — ABNORMAL HIGH (ref 4.0–10.5)
nRBC: 0 % (ref 0.0–0.2)

## 2020-07-31 MED ORDER — AMIODARONE HCL 200 MG PO TABS
400.0000 mg | ORAL_TABLET | Freq: Two times a day (BID) | ORAL | Status: DC
  Administered 2020-07-31 (×2): 400 mg via ORAL
  Filled 2020-07-31 (×2): qty 2

## 2020-07-31 MED ORDER — FUROSEMIDE 10 MG/ML IJ SOLN
40.0000 mg | Freq: Two times a day (BID) | INTRAMUSCULAR | Status: DC
  Administered 2020-07-31 (×2): 40 mg via INTRAVENOUS
  Filled 2020-07-31 (×2): qty 4

## 2020-07-31 MED ORDER — PROMETHAZINE HCL 25 MG/ML IJ SOLN
12.5000 mg | INTRAMUSCULAR | Status: DC | PRN
  Administered 2020-07-31 – 2020-08-01 (×2): 12.5 mg via INTRAVENOUS
  Filled 2020-07-31 (×2): qty 1

## 2020-07-31 MED ORDER — LEVALBUTEROL TARTRATE 45 MCG/ACT IN AERO
2.0000 | INHALATION_SPRAY | Freq: Four times a day (QID) | RESPIRATORY_TRACT | Status: DC
  Administered 2020-07-31 – 2020-08-01 (×2): 2 via RESPIRATORY_TRACT
  Filled 2020-07-31: qty 15

## 2020-07-31 MED ORDER — RIVAROXABAN 20 MG PO TABS
20.0000 mg | ORAL_TABLET | Freq: Every day | ORAL | Status: DC
  Administered 2020-07-31 – 2020-08-05 (×6): 20 mg via ORAL
  Filled 2020-07-31 (×7): qty 1

## 2020-07-31 MED FILL — Electrolyte-R (PH 7.4) Solution: INTRAVENOUS | Qty: 4000 | Status: AC

## 2020-07-31 MED FILL — Sodium Bicarbonate IV Soln 8.4%: INTRAVENOUS | Qty: 50 | Status: AC

## 2020-07-31 MED FILL — Albumin, Human Inj 5%: INTRAVENOUS | Qty: 250 | Status: AC

## 2020-07-31 MED FILL — Sodium Chloride IV Soln 0.9%: INTRAVENOUS | Qty: 2000 | Status: AC

## 2020-07-31 MED FILL — Potassium Chloride Inj 2 mEq/ML: INTRAVENOUS | Qty: 40 | Status: AC

## 2020-07-31 MED FILL — Heparin Sodium (Porcine) Inj 1000 Unit/ML: INTRAMUSCULAR | Qty: 30 | Status: AC

## 2020-07-31 MED FILL — Lidocaine HCl Local Soln Prefilled Syringe 100 MG/5ML (2%): INTRAMUSCULAR | Qty: 10 | Status: AC

## 2020-07-31 MED FILL — Calcium Chloride Inj 10%: INTRAVENOUS | Qty: 10 | Status: AC

## 2020-07-31 MED FILL — Magnesium Sulfate Inj 50%: INTRAMUSCULAR | Qty: 10 | Status: AC

## 2020-07-31 MED FILL — Mannitol IV Soln 20%: INTRAVENOUS | Qty: 500 | Status: AC

## 2020-07-31 NOTE — Progress Notes (Signed)
2 Days Post-Op Procedure(s) (LRB): CORONARY ARTERY BYPASS GRAFTING (CABG) TIMES FOUR USING BILATERAL INTERNAL MAMMARIES AND LEFT RADIAL ARTERY (N/A) MAZE (N/A) CLIPPING OF ATRIAL APPENDAGE (Left) RADIAL ARTERY HARVEST (Left) TRANSESOPHAGEAL ECHOCARDIOGRAM (TEE) (N/A) INDOCYANINE GREEN FLUORESCENCE IMAGING (ICG) (N/A) Subjective: No complaints  Objective: Vital signs in last 24 hours: Temp:  [97.7 F (36.5 C)-99 F (37.2 C)] 98.8 F (37.1 C) (09/23 0700) Pulse Rate:  [61-97] 69 (09/23 0700) Cardiac Rhythm: Ventricular paced (09/23 0400) Resp:  [19-28] 27 (09/23 0700) BP: (113-175)/(52-78) 113/70 (09/23 0600) SpO2:  [96 %-100 %] 96 % (09/23 0700) Arterial Line BP: (127-197)/(52-79) 138/57 (09/23 0700) Weight:  [125.8 kg] 125.8 kg (09/23 0500)  Hemodynamic parameters for last 24 hours: PAP: (31-53)/(10-28) 36/18 CO:  [4.6 L/min-5.7 L/min] 4.6 L/min CI:  [1.9 L/min/m2-2.4 L/min/m2] 1.9 L/min/m2  Intake/Output from previous day: 09/22 0701 - 09/23 0700 In: 2067.7 [I.V.:1925.5; IV Piggyback:142.2] Out: 1790 [Urine:1410; Chest Tube:380] Intake/Output this shift: No intake/output data recorded.  General appearance: alert and cooperative Neurologic: intact Heart: regular rate and rhythm, S1, S2 normal, no murmur, click, rub or gallop Lungs: clear to auscultation bilaterally and no wheezing Extremities: edema 1+ Wound: dressed  Lab Results: Recent Labs    07/30/20 1707 07/31/20 0440  WBC 16.5* 16.1*  HGB 9.7* 9.2*  HCT 30.9* 29.5*  PLT 203 194   BMET:  Recent Labs    07/30/20 1707 07/31/20 0440  NA 135 133*  K 4.4 4.3  CL 105 99  CO2 19* 22  GLUCOSE 174* 115*  BUN 8 12  CREATININE 1.17 1.04  CALCIUM 8.3* 8.4*    PT/INR:  Recent Labs    07/29/20 1452  LABPROT 19.2*  INR 1.7*   ABG    Component Value Date/Time   PHART 7.327 (L) 07/29/2020 2140   HCO3 18.6 (L) 07/29/2020 2140   TCO2 20 (L) 07/29/2020 2140   ACIDBASEDEF 7.0 (H) 07/29/2020 2140    O2SAT 96.0 07/29/2020 2140   CBG (last 3)  Recent Labs    07/30/20 2326 07/31/20 0417 07/31/20 0706  GLUCAP 119* 116* 143*    Assessment/Plan: S/P Procedure(s) (LRB): CORONARY ARTERY BYPASS GRAFTING (CABG) TIMES FOUR USING BILATERAL INTERNAL MAMMARIES AND LEFT RADIAL ARTERY (N/A) MAZE (N/A) CLIPPING OF ATRIAL APPENDAGE (Left) RADIAL ARTERY HARVEST (Left) TRANSESOPHAGEAL ECHOCARDIOGRAM (TEE) (N/A) INDOCYANINE GREEN FLUORESCENCE IMAGING (ICG) (N/A) Mobilize Diuresis d/c tubes/lines  Hold beta blocker   LOS: 7 days    Linden Dolin 07/31/2020

## 2020-07-31 NOTE — Plan of Care (Signed)
  Problem: Education: Goal: Knowledge of General Education information will improve Description: Including pain rating scale, medication(s)/side effects and non-pharmacologic comfort measures Outcome: Progressing   Problem: Health Behavior/Discharge Planning: Goal: Ability to manage health-related needs will improve Outcome: Progressing   Problem: Clinical Measurements: Goal: Ability to maintain clinical measurements within normal limits will improve Outcome: Progressing   Problem: Clinical Measurements: Goal: Will remain free from infection Outcome: Progressing   Problem: Clinical Measurements: Goal: Cardiovascular complication will be avoided Outcome: Progressing   Problem: Education: Goal: Understanding of CV disease, CV risk reduction, and recovery process will improve Outcome: Progressing   Problem: Education: Goal: Will demonstrate proper wound care and an understanding of methods to prevent future damage Outcome: Progressing   Problem: Education: Goal: Knowledge of disease or condition will improve Outcome: Progressing   Problem: Education: Goal: Knowledge of the prescribed therapeutic regimen will improve Outcome: Progressing   Problem: Activity: Goal: Risk for activity intolerance will decrease Outcome: Progressing   Problem: Clinical Measurements: Goal: Postoperative complications will be avoided or minimized Outcome: Progressing   Problem: Skin Integrity: Goal: Wound healing without signs and symptoms of infection Outcome: Progressing

## 2020-08-01 ENCOUNTER — Inpatient Hospital Stay (HOSPITAL_COMMUNITY): Payer: Medicare Other

## 2020-08-01 LAB — BASIC METABOLIC PANEL
Anion gap: 8 (ref 5–15)
BUN: 20 mg/dL (ref 8–23)
CO2: 26 mmol/L (ref 22–32)
Calcium: 8.1 mg/dL — ABNORMAL LOW (ref 8.9–10.3)
Chloride: 100 mmol/L (ref 98–111)
Creatinine, Ser: 1.31 mg/dL — ABNORMAL HIGH (ref 0.61–1.24)
GFR calc Af Amer: >60 mL/min (ref >60)
GFR calc non Af Amer: 56 mL/min — ABNORMAL LOW (ref >60)
Glucose, Bld: 100 mg/dL — ABNORMAL HIGH (ref 70–99)
Potassium: 3.7 mmol/L (ref 3.5–5.1)
Sodium: 134 mmol/L — ABNORMAL LOW (ref 135–145)

## 2020-08-01 LAB — CBC
HCT: 27.5 % — ABNORMAL LOW (ref 39.0–52.0)
Hemoglobin: 8.6 g/dL — ABNORMAL LOW (ref 13.0–17.0)
MCH: 28.5 pg (ref 26.0–34.0)
MCHC: 31.3 g/dL (ref 30.0–36.0)
MCV: 91.1 fL (ref 80.0–100.0)
Platelets: 215 10*3/uL (ref 150–400)
RBC: 3.02 MIL/uL — ABNORMAL LOW (ref 4.22–5.81)
RDW: 14.9 % (ref 11.5–15.5)
WBC: 17.5 10*3/uL — ABNORMAL HIGH (ref 4.0–10.5)
nRBC: 0 % (ref 0.0–0.2)

## 2020-08-01 LAB — GLUCOSE, CAPILLARY
Glucose-Capillary: 93 mg/dL (ref 70–99)
Glucose-Capillary: 108 mg/dL — ABNORMAL HIGH (ref 70–99)
Glucose-Capillary: 102 mg/dL — ABNORMAL HIGH (ref 70–99)
Glucose-Capillary: 115 mg/dL — ABNORMAL HIGH (ref 70–99)
Glucose-Capillary: 116 mg/dL — ABNORMAL HIGH (ref 70–99)

## 2020-08-01 MED ORDER — ACETAMINOPHEN 500 MG PO TABS
1000.0000 mg | ORAL_TABLET | Freq: Four times a day (QID) | ORAL | 0 refills | Status: DC | PRN
Start: 2020-08-01 — End: 2020-08-13

## 2020-08-01 MED ORDER — METOCLOPRAMIDE HCL 5 MG/ML IJ SOLN
10.0000 mg | Freq: Four times a day (QID) | INTRAMUSCULAR | Status: AC
  Administered 2020-08-01 – 2020-08-02 (×4): 10 mg via INTRAVENOUS
  Filled 2020-08-01 (×4): qty 2

## 2020-08-01 MED ORDER — ASPIRIN EC 81 MG PO TBEC
81.0000 mg | DELAYED_RELEASE_TABLET | Freq: Every day | ORAL | Status: DC
  Administered 2020-08-02 – 2020-08-05 (×4): 81 mg via ORAL
  Filled 2020-08-01 (×4): qty 1

## 2020-08-01 MED ORDER — AMIODARONE HCL 200 MG PO TABS
200.0000 mg | ORAL_TABLET | Freq: Two times a day (BID) | ORAL | Status: DC
  Administered 2020-08-01 – 2020-08-05 (×9): 200 mg via ORAL
  Filled 2020-08-01 (×9): qty 1

## 2020-08-01 MED ORDER — GUAIFENESIN ER 600 MG PO TB12: 600.0000 mg | ORAL_TABLET | Freq: Two times a day (BID) | ORAL | Status: DC | PRN

## 2020-08-01 MED ORDER — LEVALBUTEROL TARTRATE 45 MCG/ACT IN AERO
2.0000 | INHALATION_SPRAY | Freq: Two times a day (BID) | RESPIRATORY_TRACT | Status: AC
  Administered 2020-08-01 – 2020-08-02 (×3): 2 via RESPIRATORY_TRACT
  Filled 2020-08-01: qty 15

## 2020-08-01 MED ORDER — FUROSEMIDE 10 MG/ML IJ SOLN: 40.0000 mg | Freq: Every day | INTRAMUSCULAR | Status: DC

## 2020-08-01 NOTE — Progress Notes (Signed)
RN placed patient in supine position. Site around pacing wires cleaned with betadine. Pt educated on how pacer wires would be removed. RN pulled wires. Pt tolerated well.

## 2020-08-01 NOTE — Plan of Care (Signed)
°  Problem: Education: Goal: Knowledge of General Education information will improve Description: Including pain rating scale, medication(s)/side effects and non-pharmacologic comfort measures Outcome: Progressing   Problem: Health Behavior/Discharge Planning: Goal: Ability to manage health-related needs will improve Outcome: Progressing   Problem: Clinical Measurements: Goal: Ability to maintain clinical measurements within normal limits will improve Outcome: Progressing Goal: Will remain free from infection Outcome: Progressing Goal: Diagnostic test results will improve Outcome: Progressing Goal: Respiratory complications will improve Outcome: Progressing Goal: Cardiovascular complication will be avoided Outcome: Progressing   Problem: Clinical Measurements: Goal: Will remain free from infection Outcome: Progressing   Problem: Clinical Measurements: Goal: Diagnostic test results will improve Outcome: Progressing   Problem: Activity: Goal: Ability to return to baseline activity level will improve Outcome: Progressing   Problem: Cardiovascular: Goal: Vascular access site(s) Level 0-1 will be maintained Outcome: Progressing   Problem: Activity: Goal: Risk for activity intolerance will decrease Outcome: Progressing

## 2020-08-01 NOTE — Progress Notes (Signed)
Inpatient Rehab Admissions:  Inpatient Rehab Consult received.  Note OT recommending SNF, PT recs pending.  Will f/u with pt on Monday pending PT recs.    Signed: Estill Dooms, PT, DPT Admissions Coordinator [phone] 08/01/20  5:03 PM

## 2020-08-01 NOTE — Progress Notes (Signed)
[address]       [place] [address]             [phone]      3 Days Post-Op Procedure(s) (LRB): CORONARY ARTERY BYPASS GRAFTING (CABG) TIMES FOUR USING BILATERAL INTERNAL MAMMARIES AND LEFT RADIAL ARTERY (N/A) MAZE (N/A) CLIPPING OF ATRIAL APPENDAGE (Left) RADIAL ARTERY HARVEST (Left) TRANSESOPHAGEAL ECHOCARDIOGRAM (TEE) (N/A) INDOCYANINE GREEN FLUORESCENCE IMAGING (ICG) (N/A)   Subjective:  Patient states he had a rough day yesterday.  He states he ambulated and thinks he was pushed a little too hard.  Yesterday evening after eating he states he got so sick and vomited everywhere multiple times.  He states he has had a minimal BM this morning.  Objective: Vital signs in last 24 hours: Temp:  [97.5 F (36.4 C)-98 F (36.7 C)] 98 F (36.7 C) (09/24 0316) Pulse Rate:  [54-72] 60 (09/24 0316) Cardiac Rhythm: Atrial fibrillation;Bundle branch block (09/24 0729) Resp:  [15-25] 18 (09/24 0316) BP: (105-125)/(63-82) 113/63 (09/24 0316) SpO2:  [94 %-99 %] 96 % (09/24 0751) Arterial Line BP: (125-152)/(51-72) 152/59 (09/23 1100) FiO2 (%):  [28 %] 28 % (09/23 1203) Weight:  [123.6 kg] 123.6 kg (09/24 0316)  Hemodynamic parameters for last 24 hours: PAP: (39-50)/(15-21) 50/21  Intake/Output from previous day: 09/23 0701 - 09/24 0700 In: 837 [P.O.:480; I.V.:357] Out: 1285 [Urine:1285]  General appearance: alert, cooperative and no distress Heart: irregularly irregular rhythm Lungs: diminished breath sounds bibasilar Abdomen: soft, non-tender, mild distention, minimal to no BS Extremities: edema mild pitting Wound: clean and dry, staples in place on left arm  denies parashthesia  Lab Results: Recent Labs    07/31/20 0440 08/01/20 0310  WBC 16.1* 17.5*  HGB 9.2* 8.6*  HCT 29.5* 27.5*  PLT 194 215   BMET:  Recent Labs    07/31/20 0440 08/01/20 0310  NA 133* 134*  K 4.3 3.7  CL 99 100  CO2 22 26  GLUCOSE 115* 100*  BUN 12 20    CREATININE 1.04 1.31*  CALCIUM 8.4* 8.1*    PT/INR:  Recent Labs    07/29/20 1452  LABPROT 19.2*  INR 1.7*   ABG    Component Value Date/Time   PHART 7.327 (L) 07/29/2020 2140   HCO3 18.6 (L) 07/29/2020 2140   TCO2 20 (L) 07/29/2020 2140   ACIDBASEDEF 7.0 (H) 07/29/2020 2140   O2SAT 96.0 07/29/2020 2140   CBG (last 3)  Recent Labs    07/31/20 2020 07/31/20 2339 08/01/20 0314  GLUCAP 140* 121* 93    Assessment/Plan: S/P Procedure(s) (LRB): CORONARY ARTERY BYPASS GRAFTING (CABG) TIMES FOUR USING BILATERAL INTERNAL MAMMARIES AND LEFT RADIAL ARTERY (N/A) MAZE (N/A) CLIPPING OF ATRIAL APPENDAGE (Left) RADIAL ARTERY HARVEST (Left) TRANSESOPHAGEAL ECHOCARDIOGRAM (TEE) (N/A) INDOCYANINE GREEN FLUORESCENCE IMAGING (ICG) (N/A)  1. CV- H/O Chronic A. Fib, rate in the 50s this morning- will continue to avoid BB, will decrease amiodarone to 200 mg BID, Isordil for radial grafts, home Xarelto has been ordered, pacing wires remain in place, will remove EPW as discussed with Dr. Vickey Sages  2. Pulm- CTs are out, weaning oxygen as tolerated, bibasilar atelectasis, small left pleural effusion, continue IS 3. Renal- creatinine bumped to 1.31, this is likely due to aggressive diuretics, will scale back to IV Lasix 40 mg once a day, recheck BMET in AM 4. Expected post operative blood loss anemia, mild Hgb at 8.6 5. GI- multiple episodes of emesis yesterday, hypoactive bowel exam.. will add  reglan scheduled for 4 doses, will get ABD film to assess for early ileus 6. Deconditioning- get PT consult, CIR consult has been requested 7. Dispo- patient stable, d/c EPW today as discussed with Dr. Vickey Sages, creatinine elevated likely due to aggressive diuresis, repeat BMET in AM, Abd film for ?ileus, PT consult    LOS: 8 days    Lowella Dandy, PA-C 08/01/2020

## 2020-08-01 NOTE — Evaluation (Signed)
Physical Therapy Evaluation Patient Details Name: [name] Rice MRN: [id number] DOB: 12-21-1950 Today's Date: 08/01/2020   History of Present Illness  Pt is a 69 yo male admitted with light headedness and found to have CAD. Underwent CABG x4 on 9/21. PMH - a.fib, bradycardia, HTN, obesity, LBBB, chronic systolic CHF  Clinical Impression  Pt admitted with above diagnosis and presents to PT with functional limitations due to deficits listed below (See PT problem list). Pt needs skilled PT to maximize independence and safety to allow discharge to SNF since pt lives alone and has limited support.      Follow Up Recommendations SNF    Equipment Recommendations  Rolling walker with 5" wheels;Other (comment) (vs rollator)    Recommendations for Other Services       Precautions / Restrictions Precautions Precautions: Fall;Sternal;Other (comment) Precaution Comments: wound VAC      Mobility  Bed Mobility Overal bed mobility: Needs Assistance Bed Mobility: Supine to Sit     Supine to sit: Min assist;HOB elevated     General bed mobility comments: Assist to elevate trunk into sitting  Transfers Overall transfer level: Needs assistance Equipment used: Rolling walker (2 wheeled) Transfers: Sit to/from Stand Sit to Stand: Min assist         General transfer comment: Assist to bring hips up. Verbal cues for hands to knees coming to stand but pt reaching up to walker despite cues.   Ambulation/Gait Ambulation/Gait assistance: Min assist Gait Distance (Feet): 175 Feet Assistive device: Rolling walker (2 wheeled) Gait Pattern/deviations: Step-through pattern;Decreased step length - right;Decreased step length - left;Shuffle;Trunk flexed Gait velocity: decr Gait velocity interpretation: <1.31 ft/sec, indicative of household ambulator General Gait Details: Assist for balance and support. Cues to stay closer to walker on turns. 1 standing rest break  Stairs             Wheelchair Mobility    Modified Rankin (Stroke Patients Only)       Balance Overall balance assessment: Needs assistance Sitting-balance support: Feet supported Sitting balance-Leahy Scale: Good     Standing balance support: Bilateral upper extremity supported;During functional activity Standing balance-Leahy Scale: Poor Standing balance comment: walker and min guard for static standing                             Pertinent Vitals/Pain Pain Assessment: No/denies pain    Home Living Family/patient expects to be discharged to:: Private residence Living Arrangements: Alone Available Help at Discharge: Friend(s);Available PRN/intermittently Type of Home: House Home Access: Stairs to enter Entrance Stairs-Rails: None Entrance Stairs-Number of Steps: 2 Home Layout: One level Home Equipment: None      Prior Function Level of Independence: Independent         Comments: Pt walks independently, drives and cares for himself.  States that has been a bit harder lately but he gets by. Pt needs to see podiatrist for tonail care.     Hand Dominance   Dominant Hand: Right    Extremity/Trunk Assessment   Upper Extremity Assessment Upper Extremity Assessment: Defer to OT evaluation    Lower Extremity Assessment Lower Extremity Assessment: Generalized weakness    Cervical / Trunk Assessment Cervical / Trunk Assessment: Normal  Communication   Communication: No difficulties  Cognition Arousal/Alertness: Awake/alert Behavior During Therapy: WFL for tasks assessed/performed Overall Cognitive Status: Within Functional Limits for tasks assessed  General Comments General comments (skin integrity, edema, etc.): VSS on RA.     Exercises     Assessment/Plan    PT Assessment Patient needs continued PT services  PT Problem List Decreased strength;Decreased activity tolerance;Decreased balance;Decreased  mobility;Decreased knowledge of precautions;Decreased knowledge of use of DME       PT Treatment Interventions DME instruction;Gait training;Functional mobility training;Therapeutic activities;Therapeutic exercise;Balance training;Patient/family education    PT Goals (Current goals can be found in the Care Plan section)  Acute Rehab PT Goals Patient Stated Goal: to be safe and independent PT Goal Formulation: With patient Time For Goal Achievement: 08/15/20 Potential to Achieve Goals: Good    Frequency Min 2X/week   Barriers to discharge Decreased caregiver support lives alone    Co-evaluation               AM-PAC PT "6 Clicks" Mobility  Outcome Measure Help needed turning from your back to your side while in a flat bed without using bedrails?: A Little Help needed moving from lying on your back to sitting on the side of a flat bed without using bedrails?: A Little Help needed moving to and from a bed to a chair (including a wheelchair)?: A Little Help needed standing up from a chair using your arms (e.g., wheelchair or bedside chair)?: A Little Help needed to walk in hospital room?: A Little Help needed climbing 3-5 steps with a railing? : A Little 6 Click Score: 18    End of Session Equipment Utilized During Treatment: Gait belt Activity Tolerance: Patient limited by fatigue Patient left: in chair;with call bell/phone within reach Nurse Communication: Mobility status PT Visit Diagnosis: Unsteadiness on feet (R26.81);Muscle weakness (generalized) (M62.81)    Time: 2641-5830 PT Time Calculation (min) (ACUTE ONLY): 20 min   Charges:   PT Evaluation $PT Eval Moderate Complexity: 1 Mod          [facility] PT Acute Rehabilitation Services Pager [phone] Office [phone]   Angelina Ok [facility] 08/01/2020, 6:08 PM

## 2020-08-01 NOTE — Op Note (Signed)
CARDIOTHORACIC SURGERY OPERATIVE NOTE  Date of Procedure: 07/29/20  Preoperative Diagnosis: Severe 3-vessel Coronary Artery Disease, atrial fibrillation  Postoperative Diagnosis: Same  Procedure:    Bi-atrial Maze procedure and left atrial appendage clipping  Coronary Artery Bypass Grafting x 4  Left Internal Mammary Artery to Distal Left Anterior Descending Coronary Artery; pedicled RIMA Graft to Posterior Descending Coronary Artery; left radial artery  Graft to Obtuse Marginal Branch of Left Circumflex Coronary Artery and ramus intermedius as a sequenced graft;   Bilateral IMA harvesting Left radial artery harvesting  Surgeon: B. Lorayne Marek, MD  Assistant: Jaclyn Prime, PA-C Anesthesia: get  Operative Findings: Mildly reduced left ventricular systolic function  Good quality left internal mammary artery conduit  Good  quality radial artery conduit  Good quality target vessels for grafting    BRIEF CLINICAL NOTE AND INDICATIONS FOR SURGERY  69 yo man with long-standing atrial fibrillation presented with worsening shortness of breath. He underwent stress testing which was positive, followed by LHC showing severe 3V CAD. He is taken to the OR for CABG and atrial ablation.    DETAILS OF THE OPERATIVE PROCEDURE  Preparation:  The patient is brought to the operating room on the above mentioned date and central monitoring was established by the anesthesia team including placement of Swan-Ganz catheter and radial arterial line. The patient is placed in the supine position on the operating table.  Intravenous antibiotics are administered. General endotracheal anesthesia is induced uneventfully. A Foley catheter is placed.  Baseline transesophageal echocardiogram was performed.  Findings were notable for mildly reduced LV function and intact valve function  The patient's chest, abdomen, both groins, and both lower extremities are prepared and draped in a sterile manner. A time out  procedure is performed.   Surgical Approach and Conduit Harvest:  A median sternotomy incision was performed and the left internal mammary artery is dissected from the chest wall and prepared for bypass grafting. The left internal mammary artery is notably good quality conduit. Simultaneously, the left radial artery is  obtained by an open technique, having performed modified Allen's testoing preoperatively to assess hand perfusion. After removal of the left radial artery the forearm incision is closed and the arm is dressed and tucked at the side. Next, the right IMA is harvested in a standard fashion. Prior to dividing the pedicle distally, full dose heparin is delivered. Following systemic heparinization, the internal mammary artery grafts were transected distally noted to have excellent flow. They were treated with papaverine.    Extracorporeal Cardiopulmonary Bypass and Myocardial Protection:  The pericardium is opened. The ascending aorta is nondiseased in appearance. The ascending aorta and the SVC and IVC are cannulated for cardiopulmonary bypass.  Adequate heparinization is verified.   A retrograde cardioplegia cannula is placed through the right atrium into the coronary sinus.  The entire pre-bypass portion of the operation was notable for stable hemodynamics.  Cardiopulmonary bypass was begun and the surface of the heart is inspected. Distal target vessels are selected for coronary artery bypass grafting. A cardioplegia cannula is placed in the ascending aorta.    The right sided pulmonary veins are encircled and treated with bipolar RFA for Right PVI.   The patient is allowed to cool passively to 34 deg C systemic temperature.  The aortic cross clamp is applied and cold blood cardioplegia is delivered initially in an antegrade fashion through the aortic root.  Supplemental cardioplegia is given retrograde through the coronary sinus catheter.  Iced saline slush is applied  for topical  hypothermia.  The initial cardioplegic arrest is rapid with early diastolic arrest.  Repeat doses of cardioplegia are administered intermittently throughout the entire cross clamp portion of the operation through the aortic root,  through the coronary sinus catheter, and through subsequently placed  grafts in order to maintain completely flat electrocardiogram.  After arrest, the left sided pulmonary veins are encircled and bipolar RFA treatment of the veins is performed. The left atrial appendage is clipped. Next caval snares are brought down and the right atrial free wall is opened. RFA treatment from the SVC to IVC is performed. The caval-tricuspid lesion is completed with cryothermy. The intra-atrial septum is opened and the roof and floor lesions and the mitral isthmus lesions are created with cryothermy. The intra-atrial septum and the RA free wall is closed with running suture. This completed the bi-atrial, complete Maze procedure.  Coronary Artery Bypass Grafting:   The  posterior descending branch of the right coronary artery was grafted using the pedicled RIMA graft in an end-to-side fashion.  At the site of distal anastomosis the target vessel was good quality and measured approximately 1.5  mm in diameter. Anastomotic patency and runoff was confirmed with indocyanine green fluorescence imaging (SPY).  The  obtuse marginal branch of the left circumflex coronary artery and the ramus intermedius coronary were grafted using the left radial artery graft in a sequenced fashion.  At the site of distal anastomoses the target vessels were good quality and measured approximately 1.5 mm in diameter.    The distal left anterior coronary artery was grafted with the left internal mammary artery in an end-to-side fashion.  At the site of distal anastomosis the target vessel was good quality and measured approximately 1.5 mm in diameter. Anastomotic patency and runoff was confirmed with indocyanine green  fluorescence imaging (SPY).  The  proximal radial graft anastomoses was placed directly to the ascending aorta prior to removal of the aortic cross clamp.  Deairing procedures were performed and the aortic cross clamp was removed.   Procedure Completion:  All proximal and distal coronary anastomoses were inspected for hemostasis and appropriate graft orientation. Epicardial pacing wires are fixed to the right ventricular outflow tract and to the right atrial appendage. The patient is rewarmed to 37C temperature. The patient is weaned and disconnected from cardiopulmonary bypass.  The patient's rhythm at separation from bypass was heart block  The patient was weaned from cardiopulmonary bypass without any inotropic support.   Followup transesophageal echocardiogram performed after separation from bypass revealed  no changes from the preoperative exam.  The aortic and venous cannula were removed uneventfully. Protamine was administered to reverse the anticoagulation. The mediastinum and pleural space were inspected for hemostasis and irrigated with saline solution. The mediastinum and both pleural spaces were drained using fluted chest tubes placed through separate stab incisions inferiorly.  The soft tissues anterior to the aorta were reapproximated loosely. The sternum is closed with double strength sternal wire. The soft tissues anterior to the sternum were closed in multiple layers and the skin is closed with a running subcuticular skin closure.  The post-bypass portion of the operation was notable for stable rhythm and hemodynamics.  No blood products were administered during the operation.   Disposition:  The patient tolerated the procedure well and is transported to the surgical intensive care in stable condition. There are no intraoperative complications. All sponge instrument and needle counts are verified correct at completion of the operation.    B. Zane  Vickey Sages, MD 08/01/2020 3:49  PM

## 2020-08-01 NOTE — Evaluation (Signed)
Occupational Therapy Evaluation Patient Details Name: [name] Rice MRN: [id number] DOB: Dec 05, 1950 Today's Date: 08/01/2020    History of Present Illness Pt is a 69 yo male admitted for CABG x4.     Clinical Impression   Pt admitted with the above diagnosis and has the deficits listed below. Pt would benefit from cont OT to increase independence with basic adls and adl mobility so he can eventually return home to live alone. Pt has friends that can check on him regularly but does not have 24/7 assist. Due to pt's surgery and decreased balance on his feet, feel pt would benefit from SNF rehab stay to ensure his safety to dc back home alone.  Will follow with initial focus on adls on his feet and balance.     Follow Up Recommendations  SNF;Supervision/Assistance - 24 hour    Equipment Recommendations  3 in 1 bedside commode    Recommendations for Other Services       Precautions / Restrictions Precautions Precautions: Fall Restrictions Weight Bearing Restrictions: No      Mobility Bed Mobility Overal bed mobility: Needs Assistance Bed Mobility: Supine to Sit;Sit to Supine     Supine to sit: Min assist;HOB elevated Sit to supine: Min assist;HOB elevated   General bed mobility comments: Pt required min assist to get all the way up into sitting. Was able to scoot to EOB and back without assist.   Transfers Overall transfer level: Needs assistance Equipment used: 1 person hand held assist Transfers: Sit to/from [employer] Sit to Stand: Min assist Stand pivot transfers: Mod assist       General transfer comment: Pt's weight on heels when pivoting to the bed.  Feel walker may help him get his weight foward as long as he does not put too much weight through the walker due to sternal precautions.    Balance Overall balance assessment: Needs assistance Sitting-balance support: Feet supported Sitting balance-Leahy Scale: Good     Standing balance  support: Bilateral upper extremity supported;During functional activity Standing balance-Leahy Scale: Poor Standing balance comment: must have outside support to stand.                           ADL either performed or assessed with clinical judgement   ADL Overall ADL's : Needs assistance/impaired Eating/Feeding: Independent;Sitting   Grooming: Wash/dry hands;Wash/dry face;Oral care;Set up;Sitting   Upper Body Bathing: Set up;Sitting   Lower Body Bathing: Minimal assistance;Sit to/from stand;Cueing for compensatory techniques Lower Body Bathing Details (indicate cue type and reason): Pt requires assist in standing to wash bottom due to balance deficits. Upper Body Dressing : Minimal assistance;Sitting   Lower Body Dressing: Moderate assistance;Sit to/from stand;Cueing for compensatory techniques Lower Body Dressing Details (indicate cue type and reason): Pt doffed socks propping feet on trash can. Pt required assist to start socks over feet to donn them. Toilet Transfer: Moderate assistance;Stand-pivot;BSC Toilet Transfer Details (indicate cue type and reason): Pt hugged pillow into chest and did well pushing through legs. Pt has posterior lean when on his feet.  Toileting- Clothing Manipulation and Hygiene: Minimal assistance;Sit to/from stand;Cueing for compensatory techniques Toileting - Clothing Manipulation Details (indicate cue type and reason): min assist to clean self in standing.      Functional mobility during ADLs: Moderate assistance General ADL Comments: Pt did well overall with adls. Limited with adls in standing due to posterior lean.  Feel with walker pt may be able to  get his weight forward.       Vision Baseline Vision/History: No visual deficits Patient Visual Report: No change from baseline Vision Assessment?: No apparent visual deficits     Perception Perception Perception Tested?: No   Praxis Praxis Praxis tested?: Within functional limits     Pertinent Vitals/Pain Pain Assessment: 0-10 Pain Score: 3  Pain Location: incision Pain Descriptors / Indicators: Sore Pain Intervention(s): Limited activity within patient's tolerance;Monitored during session;Repositioned;Premedicated before session     Hand Dominance Right   Extremity/Trunk Assessment Upper Extremity Assessment Upper Extremity Assessment: Overall WFL for tasks assessed   Lower Extremity Assessment Lower Extremity Assessment: Defer to PT evaluation   Cervical / Trunk Assessment Cervical / Trunk Assessment: Normal   Communication Communication Communication: No difficulties   Cognition Arousal/Alertness: Awake/alert Behavior During Therapy: WFL for tasks assessed/performed Overall Cognitive Status: Within Functional Limits for tasks assessed                                     General Comments  Pt with wound vac and foley catheter.    Exercises     Shoulder Instructions      Home Living Family/patient expects to be discharged to:: Private residence Living Arrangements: Alone Available Help at Discharge: Friend(s);Available PRN/intermittently Type of Home: House Home Access: Stairs to enter [employer] of Steps: 2 Entrance Stairs-Rails: None Home Layout: One level     Bathroom Shower/Tub: IT trainer: Standard     Home Equipment: None   Additional Comments: Pt would benefit from 3:1.        Prior Functioning/Environment Level of Independence: Independent        Comments: Pt walks independently, drives and cares for himself.  States that has been a bit harder lately but he gets by. Pt needs to see podiatrist for tonail care.        OT Problem List: Impaired balance (sitting and/or standing);Decreased knowledge of use of DME or AE;Pain      OT Treatment/Interventions: Self-care/ADL training;Therapeutic activities;DME and/or AE instruction;Balance training    OT  Goals(Current goals can be found in the care plan section) Acute Rehab OT Goals Patient Stated Goal: to be safe and independent OT Goal Formulation: With patient Time For Goal Achievement: 08/15/20 Potential to Achieve Goals: Good ADL Goals Pt Will Perform Grooming: with supervision;standing Pt Will Perform Lower Body Bathing: with supervision;sit to/from stand Pt Will Perform Lower Body Dressing: with supervision;sit to/from stand Additional ADL Goal #1: Pt will walk to bathroom with assistive device and toilet on 3:1 over commode with supervision Additional ADL Goal #2: Pt will transfer to tub with tub bench and walker with supervision.  OT Frequency: Min 2X/week   Barriers to D/C: Decreased caregiver support  Pt lives alone       Co-evaluation              AM-PAC OT "6 Clicks" Daily Activity     Outcome Measure Help from another person eating meals?: None Help from another person taking care of personal grooming?: None Help from another person toileting, which includes using toliet, bedpan, or urinal?: A Little Help from another person bathing (including washing, rinsing, drying)?: A Little Help from another person to put on and taking off regular upper body clothing?: A Little Help from another person to put on and taking off regular lower body clothing?: A Lot 6 Click Score:  19   End of Session Nurse Communication: Mobility status  Activity Tolerance: Patient tolerated treatment well Patient left: in bed;with call bell/phone within reach;with bed alarm set  OT Visit Diagnosis: Unsteadiness on feet (R26.81);Other abnormalities of gait and mobility (R26.89);Pain Pain - part of body:  (sternum)                Time: 2774-1287 OT Time Calculation (min): 36 min Charges:  OT General Charges $OT Visit: 1 Visit OT Evaluation $OT Eval Moderate Complexity: 1 Mod OT Treatments $Self Care/Home Management : 8-22 mins  Hope Budds 08/01/2020, 11:35 AM

## 2020-08-01 NOTE — Progress Notes (Signed)
Pt walked with PT earlier today. Now with wires pulled. Will f/u tomorrow. Ethelda Chick CES, ACSM 2:45 PM 08/01/2020

## 2020-08-02 LAB — CBC
HCT: 27.6 % — ABNORMAL LOW (ref 39.0–52.0)
Hemoglobin: 8.4 g/dL — ABNORMAL LOW (ref 13.0–17.0)
MCH: 27.9 pg (ref 26.0–34.0)
MCHC: 30.4 g/dL (ref 30.0–36.0)
MCV: 91.7 fL (ref 80.0–100.0)
Platelets: 268 10*3/uL (ref 150–400)
RBC: 3.01 MIL/uL — ABNORMAL LOW (ref 4.22–5.81)
RDW: 15.1 % (ref 11.5–15.5)
WBC: 14 10*3/uL — ABNORMAL HIGH (ref 4.0–10.5)
nRBC: 0 % (ref 0.0–0.2)

## 2020-08-02 LAB — BASIC METABOLIC PANEL
Anion gap: 6 (ref 5–15)
BUN: 25 mg/dL — ABNORMAL HIGH (ref 8–23)
CO2: 26 mmol/L (ref 22–32)
Calcium: 8.1 mg/dL — ABNORMAL LOW (ref 8.9–10.3)
Chloride: 99 mmol/L (ref 98–111)
Creatinine, Ser: 1.29 mg/dL — ABNORMAL HIGH (ref 0.61–1.24)
GFR calc Af Amer: >60 mL/min (ref >60)
GFR calc non Af Amer: 57 mL/min — ABNORMAL LOW (ref >60)
Glucose, Bld: 97 mg/dL (ref 70–99)
Potassium: 3.8 mmol/L (ref 3.5–5.1)
Sodium: 131 mmol/L — ABNORMAL LOW (ref 135–145)

## 2020-08-02 MED ORDER — GLYCERIN (LAXATIVE) 2.1 G RE SUPP
1.0000 | Freq: Every day | RECTAL | Status: DC | PRN
  Filled 2020-08-02: qty 1

## 2020-08-02 MED ORDER — BISACODYL 10 MG RE SUPP: 10.0000 mg | Freq: Once | RECTAL | Status: DC

## 2020-08-02 MED ORDER — METOCLOPRAMIDE HCL 5 MG/ML IJ SOLN
10.0000 mg | Freq: Four times a day (QID) | INTRAMUSCULAR | Status: AC
  Administered 2020-08-02 – 2020-08-03 (×4): 10 mg via INTRAVENOUS
  Filled 2020-08-02 (×4): qty 2

## 2020-08-02 NOTE — Progress Notes (Addendum)
[address]       [place] [address]             [phone]      4 Days Post-Op Procedure(s) (LRB): CORONARY ARTERY BYPASS GRAFTING (CABG) TIMES FOUR USING BILATERAL INTERNAL MAMMARIES AND LEFT RADIAL ARTERY (N/A) MAZE (N/A) CLIPPING OF ATRIAL APPENDAGE (Left) RADIAL ARTERY HARVEST (Left) TRANSESOPHAGEAL ECHOCARDIOGRAM (TEE) (N/A) INDOCYANINE GREEN FLUORESCENCE IMAGING (ICG) (N/A) Subjective: Feels okay this morning. Had some gas but no BM yet  Objective: Vital signs in last 24 hours: Temp:  [97.5 F (36.4 C)-98.2 F (36.8 C)] 97.6 F (36.4 C) (09/25 1135) Pulse Rate:  [61-81] 81 (09/25 1135) Cardiac Rhythm: Atrial fibrillation (09/25 0733) Resp:  [14-21] 18 (09/25 1135) BP: (104-140)/(55-97) 140/75 (09/25 1135) SpO2:  [91 %-95 %] 95 % (09/25 1135)    Intake/Output from previous day: 09/24 0701 - 09/25 0700 In: -  Out: 75 [Urine:75] Intake/Output this shift: Total I/O In: 240 [P.O.:240] Out: 100 [Urine:100]  General appearance: alert, cooperative and no distress Heart: regular rate and rhythm, S1, S2 normal, no murmur, click, rub or gallop Lungs: clear to auscultation bilaterally Abdomen: soft, non-tender; bowel sounds normal; no masses,  no organomegaly Extremities: extremities normal, atraumatic, no cyanosis or edema Wound: clean and dry with prevena in place  Lab Results: Recent Labs    08/01/20 0310 08/02/20 0432  WBC 17.5* 14.0*  HGB 8.6* 8.4*  HCT 27.5* 27.6*  PLT 215 268   BMET:  Recent Labs    08/01/20 0310 08/02/20 0432  NA 134* 131*  K 3.7 3.8  CL 100 99  CO2 26 26  GLUCOSE 100* 97  BUN 20 25*  CREATININE 1.31* 1.29*  CALCIUM 8.1* 8.1*    PT/INR: No results for input(s): LABPROT, INR in the last 72 hours. ABG    Component Value Date/Time   PHART 7.327 (L) 07/29/2020 2140   HCO3 18.6 (L) 07/29/2020 2140   TCO2 20 (L) 07/29/2020 2140   ACIDBASEDEF 7.0 (H) 07/29/2020 2140   O2SAT 96.0 07/29/2020 2140    CBG (last 3)  Recent Labs    08/01/20 1142 08/01/20 1931 08/01/20 2129  GLUCAP 102* 115* 116*    Assessment/Plan: S/P Procedure(s) (LRB): CORONARY ARTERY BYPASS GRAFTING (CABG) TIMES FOUR USING BILATERAL INTERNAL MAMMARIES AND LEFT RADIAL ARTERY (N/A) MAZE (N/A) CLIPPING OF ATRIAL APPENDAGE (Left) RADIAL ARTERY HARVEST (Left) TRANSESOPHAGEAL ECHOCARDIOGRAM (TEE) (N/A) INDOCYANINE GREEN FLUORESCENCE IMAGING (ICG) (N/A)  1. CV- H/O Chronic A. Fib, rate in the 80s this morning- continue amiodarone to 200 mg BID, Isordil for radial grafts, home Xarelto has been ordered, epw moved.  2. Pulm- CTs are out, weaning oxygen as tolerated, bibasilar atelectasis, small left pleural effusion, continue IS 3. Renal- creatinine 1.29, continue IV Lasix 40 mg once a day 4. Expected post operative blood loss anemia, mild Hgb at 8.4 5. GI- tolerating a liquid/thickened diet. ++ bowel sounds. + flatus but no BM yet. Offered a suppository but patient seemed hesitant. Will make available PRN.  6. Deconditioning- get PT consult, CIR consult has been requested  Plan: Suppository made available but +flatus and +bowel sounds. Otherwise, doing well. Encouraged to ambulate today. Continue reglan.    LOS: 9 days    Sharlene Dory 08/02/2020  patient examined, KUB reviewed Moderate symptomatic ileus reglan redosed, suppository ordered and patient will ambulate PATIENT REQUESTS SNF at discharge  patient examined and medical record reviewed,agree with above note. Kathlee Nations Trigt III 08/02/2020  Active bowel sounds on exam, abd soft

## 2020-08-02 NOTE — Progress Notes (Signed)
Arrived to room to ambulate patient, RN assisting patient to bathroom. When patient voiding, encouraged him to walk in hallway. Pt refuses to do so at this time. Pt was walked back to bedside chair with walker. Pt with wound Vac in place. Call bell in reach. Use of I/S stressed 10x/hr. Pt obtained 1000 ml. RN aware that patient refused walk at his time.   11:07 - 11:30  Lorin Picket MS, ACSM-EP-C, CCRP 01/03/2020 11:39 am

## 2020-08-02 NOTE — Progress Notes (Signed)
Inpatient Rehab Admissions Coordinator:   Note PT and OT both recommending SNF.  CIR will sign off at this time.   Estill Dooms, PT, DPT Admissions Coordinator [phone] 08/02/20  4:33 PM

## 2020-08-03 LAB — BASIC METABOLIC PANEL
Anion gap: 11 (ref 5–15)
BUN: 23 mg/dL (ref 8–23)
CO2: 25 mmol/L (ref 22–32)
Calcium: 8.3 mg/dL — ABNORMAL LOW (ref 8.9–10.3)
Chloride: 98 mmol/L (ref 98–111)
Creatinine, Ser: 1.31 mg/dL — ABNORMAL HIGH (ref 0.61–1.24)
GFR calc Af Amer: >60 mL/min (ref >60)
GFR calc non Af Amer: 56 mL/min — ABNORMAL LOW (ref >60)
Glucose, Bld: 105 mg/dL — ABNORMAL HIGH (ref 70–99)
Potassium: 3.4 mmol/L — ABNORMAL LOW (ref 3.5–5.1)
Sodium: 134 mmol/L — ABNORMAL LOW (ref 135–145)

## 2020-08-03 LAB — CBC
HCT: 28.7 % — ABNORMAL LOW (ref 39.0–52.0)
Hemoglobin: 9 g/dL — ABNORMAL LOW (ref 13.0–17.0)
MCH: 28.5 pg (ref 26.0–34.0)
MCHC: 31.4 g/dL (ref 30.0–36.0)
MCV: 90.8 fL (ref 80.0–100.0)
Platelets: 330 10*3/uL (ref 150–400)
RBC: 3.16 MIL/uL — ABNORMAL LOW (ref 4.22–5.81)
RDW: 15 % (ref 11.5–15.5)
WBC: 12.4 10*3/uL — ABNORMAL HIGH (ref 4.0–10.5)
nRBC: 0 % (ref 0.0–0.2)

## 2020-08-03 MED ORDER — POTASSIUM CHLORIDE CRYS ER 20 MEQ PO TBCR
20.0000 meq | EXTENDED_RELEASE_TABLET | Freq: Two times a day (BID) | ORAL | Status: AC
  Administered 2020-08-03 (×2): 20 meq via ORAL
  Filled 2020-08-03 (×2): qty 1

## 2020-08-03 NOTE — Progress Notes (Signed)
Negative pressure wound therapy is dc with the bedside verbal order from Dr. Donata Clay. Pt tolerated well, wound site and stitches all clean, dry and intact. Pained with betadine and leave it open to air. Pt is up to the recliner, and walking around the room, will continue to monitor the patient   Lonia Farber, RN

## 2020-08-03 NOTE — Progress Notes (Addendum)
[address]       [place] [address]             [phone]      5 Days Post-Op Procedure(s) (LRB): CORONARY ARTERY BYPASS GRAFTING (CABG) TIMES FOUR USING BILATERAL INTERNAL MAMMARIES AND LEFT RADIAL ARTERY (N/A) MAZE (N/A) CLIPPING OF ATRIAL APPENDAGE (Left) RADIAL ARTERY HARVEST (Left) TRANSESOPHAGEAL ECHOCARDIOGRAM (TEE) (N/A) INDOCYANINE GREEN FLUORESCENCE IMAGING (ICG) (N/A) Subjective: Several bowel movements yesterday and his stomach feels better this morning.   Objective: Vital signs in last 24 hours: Temp:  [97.6 F (36.4 C)-97.7 F (36.5 C)] 97.6 F (36.4 C) (09/26 0408) Pulse Rate:  [71-97] 97 (09/26 0408) Cardiac Rhythm: Atrial fibrillation (09/26 0400) Resp:  [17-20] 20 (09/25 2338) BP: (128-140)/(68-81) 140/81 (09/26 0408) SpO2:  [93 %-99 %] 99 % (09/25 1951) Weight:  [121.1 kg] 121.1 kg (09/26 0408)     Intake/Output from previous day: 09/25 0701 - 09/26 0700 In: 720 [P.O.:720] Out: 600 [Urine:600] Intake/Output this shift: No intake/output data recorded.  General appearance: alert, cooperative and no distress Heart: regular rate and rhythm, S1, S2 normal, no murmur, click, rub or gallop Lungs: clear to auscultation bilaterally Abdomen: soft, non-tender; bowel sounds normal; no masses,  no organomegaly Extremities: extremities normal, atraumatic, no cyanosis or edema Wound: clean and dry  Lab Results: Recent Labs    08/02/20 0432 08/03/20 0104  WBC 14.0* 12.4*  HGB 8.4* 9.0*  HCT 27.6* 28.7*  PLT 268 330   BMET:  Recent Labs    08/02/20 0432 08/03/20 0104  NA 131* 134*  K 3.8 3.4*  CL 99 98  CO2 26 25  GLUCOSE 97 105*  BUN 25* 23  CREATININE 1.29* 1.31*  CALCIUM 8.1* 8.3*    PT/INR: No results for input(s): LABPROT, INR in the last 72 hours. ABG    Component Value Date/Time   PHART 7.327 (L) 07/29/2020 2140   HCO3 18.6 (L) 07/29/2020 2140   TCO2 20 (L) 07/29/2020 2140   ACIDBASEDEF 7.0 (H)  07/29/2020 2140   O2SAT 96.0 07/29/2020 2140   CBG (last 3)  Recent Labs    08/01/20 1142 08/01/20 1931 08/01/20 2129  GLUCAP 102* 115* 116*    Assessment/Plan: S/P Procedure(s) (LRB): CORONARY ARTERY BYPASS GRAFTING (CABG) TIMES FOUR USING BILATERAL INTERNAL MAMMARIES AND LEFT RADIAL ARTERY (N/A) MAZE (N/A) CLIPPING OF ATRIAL APPENDAGE (Left) RADIAL ARTERY HARVEST (Left) TRANSESOPHAGEAL ECHOCARDIOGRAM (TEE) (N/A) INDOCYANINE GREEN FLUORESCENCE IMAGING (ICG) (N/A)  1. CV- H/O Chronic A. Fib, rate in the 80s this morning- continue amiodarone to 200 mg BID, Isordil for radial grafts, home Xarelto has been ordered, epw removed.  2. Pulm- CTs are out, weaning oxygen as tolerated, bibasilar atelectasis, small left pleural effusion, continue IS 3. Renal- creatinine 1.31, continue IV Lasix 40 mg once a day, replace potassium 4. Expected post operative blood loss anemia, mild Hgb at 9.0 5. GI- tolerating a liquid/thickened diet. ++ bowel movements  6. Deconditioning- pt/ot working with him. He is requesting [facility] which is on [address].     Plan: Ileus resolved. Continue bowel regimen. Working on SNF/rehab placement. TOC consult placed. Continue working with PT/OT    LOS: 10 days    Sharlene Dory 08/03/2020  Controlled atrial fibrillation on Xarelto Diuresing well body weight still 10 pounds above preop Abdominal symptoms improved after bowel movement x2 Patient will probably need skilled nursing facility with physical therapy at discharge  patient examined and medical record reviewed,agree  with above note. Kathlee Nations Trigt III 08/03/2020

## 2020-08-04 MED ORDER — FUROSEMIDE 10 MG/ML IJ SOLN
40.0000 mg | Freq: Once | INTRAMUSCULAR | Status: AC
  Administered 2020-08-04: 40 mg via INTRAVENOUS
  Filled 2020-08-04: qty 4

## 2020-08-04 MED ORDER — POTASSIUM CHLORIDE CRYS ER 20 MEQ PO TBCR
20.0000 meq | EXTENDED_RELEASE_TABLET | Freq: Every day | ORAL | Status: DC
  Administered 2020-08-04: 20 meq via ORAL
  Filled 2020-08-04: qty 1

## 2020-08-04 MED ORDER — ASPIRIN 81 MG PO TBEC
81.0000 mg | DELAYED_RELEASE_TABLET | Freq: Every day | ORAL | 11 refills | Status: DC
Start: 2020-08-04 — End: 2020-08-26

## 2020-08-04 MED ORDER — METOPROLOL TARTRATE 12.5 MG HALF TABLET
12.5000 mg | ORAL_TABLET | Freq: Two times a day (BID) | ORAL | Status: DC
  Administered 2020-08-04 – 2020-08-05 (×3): 12.5 mg via ORAL
  Filled 2020-08-04 (×3): qty 1

## 2020-08-04 MED ORDER — DIPHENHYDRAMINE HCL 25 MG PO CAPS
25.0000 mg | ORAL_CAPSULE | Freq: Every day | ORAL | Status: DC
  Administered 2020-08-04: 25 mg via ORAL
  Filled 2020-08-04: qty 1

## 2020-08-04 NOTE — NC FL2 (Signed)
[place] LEVEL OF CARE SCREENING TOOL     IDENTIFICATION  Patient Name: [name] Rice Birthdate: 07/03/1951 Sex: male Admission Date (Current Location): 07/23/2020  [facility] and [place] Number:  Herbalist and Address:  [facility], [place] [address] [place], [place] [address]      Provider Number: [id number]  Attending Physician Name and Address:  Wonda Olds, MD  Relative Name and Phone Number:  Omari Mcmanaway    Current Level of Care: Hospital Recommended Level of Care: [place] Prior Approval Number:    Date Approved/Denied:   PASRR Number: [id number]  Discharge Plan: SNF    Current Diagnoses: Patient Active Problem List   Diagnosis Date Noted  . Postural dizziness with presyncope 07/24/2020  . Hypokalemia 07/24/2020  . Diarrhea 07/24/2020  . Lightheadedness 07/24/2020  . A-fib ([place])   . Long term current use of anticoagulant 01/14/2020  . Benign hypertension with CKD (chronic kidney disease) stage III 06/12/2019  . Pure hypercholesterolemia 06/12/2019  . Atrial fibrillation ([place]) 09/25/2016    Orientation RESPIRATION BLADDER Height & Weight     Self, Time, Situation, Place  Normal Continent, External catheter Weight: 264 lb 12.4 oz (120.1 kg) Height:  '6\' 1"'  (185.4 cm)  BEHAVIORAL SYMPTOMS/MOOD NEUROLOGICAL BOWEL NUTRITION STATUS      Continent Diet (see dc summary)  AMBULATORY STATUS COMMUNICATION OF NEEDS Skin   Limited Assist Verbally Surgical wounds (Closed incision left arm 07/29/20, closed incision chest 07/29/20)                       Personal Care Assistance Level of Assistance  Bathing, Feeding, Dressing Bathing Assistance: Limited assistance Feeding assistance: Independent Dressing Assistance: Limited assistance     Functional Limitations Info  Sight, Hearing, Speech Sight Info: Adequate Hearing Info: Adequate Speech Info: Adequate    SPECIAL CARE FACTORS FREQUENCY   PT (By licensed PT), OT (By licensed OT)     PT Frequency: 5x week OT Frequency: 5x week            Contractures Contractures Info: Not present    Additional Factors Info  Code Status, Allergies, Psychotropic Code Status Info: Full Allergies Info: NKA Psychotropic Info: traMADol (ULTRAM) tablet 50-100 mg every 4 hours PRN,         Current Medications (08/04/2020):  This is the current hospital active medication list Current Facility-Administered Medications  Medication Dose Route Frequency Provider Last Rate Last Admin  . 0.45 % sodium chloride infusion   Intravenous Continuous PRN Barrett, Erin R, PA-C   Stopped at 07/30/20 0542  . 0.9 %  sodium chloride infusion  250 mL Intravenous Continuous Barrett, Erin R, PA-C      . 0.9 %  sodium chloride infusion   Intravenous Continuous Barrett, Erin R, PA-C 10 mL/hr at 07/29/20 1456 New Bag at 07/29/20 1456  . amiodarone (PACERONE) tablet 200 mg  200 mg Oral BID Barrett, Erin R, PA-C   200 mg at 08/04/20 0957  . aspirin EC tablet 81 mg  81 mg Oral Daily Barrett, Erin R, PA-C   81 mg at 08/04/20 0958  . chlorhexidine gluconate (MEDLINE KIT) (PERIDEX) 0.12 % solution 15 mL  15 mL Mouth Rinse BID Wonda Olds, MD   15 mL at 08/04/20 0958  . Chlorhexidine Gluconate Cloth 2 % PADS 6 each  6 each Topical Daily Wonda Olds, MD   6 each at 08/04/20 [phone]  .  diphenhydrAMINE (BENADRYL) capsule 25 mg  25 mg Oral QHS Zimmerman, Donielle M, PA-C      . guaiFENesin (MUCINEX) 12 hr tablet 600 mg  600 mg Oral BID Grace Isaac, MD   600 mg at 08/04/20 0957  . influenza vaccine adjuvanted (FLUAD) injection 0.5 mL  0.5 mL Intramuscular Tomorrow-1000 Gherghe, Costin M, MD      . isosorbide dinitrate (ISORDIL) tablet 10 mg  10 mg Oral TID Wonda Olds, MD   10 mg at 08/04/20 0957  . metoprolol tartrate (LOPRESSOR) injection 2.5-5 mg  2.5-5 mg Intravenous Q2H PRN Barrett, Erin R, PA-C   5 mg at 07/30/20 1656  . metoprolol tartrate  (LOPRESSOR) tablet 12.5 mg  12.5 mg Oral BID Lars Pinks M, PA-C   12.5 mg at 08/04/20 0957  . ondansetron (ZOFRAN) injection 4 mg  4 mg Intravenous Q6H PRN Barrett, Erin R, PA-C   4 mg at 08/03/20 0829  . oxyCODONE (Oxy IR/ROXICODONE) immediate release tablet 5-10 mg  5-10 mg Oral Q3H PRN Barrett, Erin R, PA-C   10 mg at 07/30/20 1649  . pantoprazole (PROTONIX) EC tablet 40 mg  40 mg Oral Daily Barrett, Erin R, PA-C   40 mg at 08/04/20 0958  . potassium chloride SA (KLOR-CON) CR tablet 20 mEq  20 mEq Oral Daily Lars Pinks M, PA-C   20 mEq at 08/04/20 0957  . promethazine (PHENERGAN) injection 12.5 mg  12.5 mg Intravenous Q4H PRN Wonda Olds, MD   12.5 mg at 08/01/20 1644  . psyllium (HYDROCIL/METAMUCIL) 1 packet  1 packet Oral Daily Barrett, Erin R, PA-C   1 packet at 08/02/20 0852  . rivaroxaban (XARELTO) tablet 20 mg  20 mg Oral Q supper Wonda Olds, MD   20 mg at 08/03/20 1606  . rosuvastatin (CRESTOR) tablet 40 mg  40 mg Oral Daily Barrett, Erin R, PA-C   40 mg at 08/04/20 0957  . sodium chloride flush (NS) 0.9 % injection 10-40 mL  10-40 mL Intracatheter Q12H Wonda Olds, MD   10 mL at 08/04/20 0959  . sodium chloride flush (NS) 0.9 % injection 10-40 mL  10-40 mL Intracatheter PRN Atkins, Glenice Bow, MD      . sodium chloride flush (NS) 0.9 % injection 3 mL  3 mL Intravenous Q12H Barrett, Erin R, PA-C   3 mL at 08/04/20 0959  . sodium chloride flush (NS) 0.9 % injection 3 mL  3 mL Intravenous PRN Barrett, Erin R, PA-C      . traMADol (ULTRAM) tablet 50-100 mg  50-100 mg Oral Q4H PRN Barrett, Erin R, PA-C   50 mg at 07/31/20 0416     Discharge Medications: Please see discharge summary for a list of discharge medications.  Relevant Imaging Results:  Relevant Lab Results:   Additional Information SSN [id number]  Loreta Ave, LCSWA

## 2020-08-04 NOTE — Progress Notes (Signed)
CARDIAC REHAB PHASE I    IN BATHROOM     MODE:  Ambulation:  TO BED  ft   POST:  Rate/Rhythm: 94 ?afib/BBB  BP:  Supine: 137/80  Sitting:   Standing:    SaO2: 94%RA 1415-1500 Pt had been put in bathroom by NTs.  Came to see to walk. Offered to walk after pt finished in bathroom. Pt stated he was too tired and nauseated to try to walk. Offered to go to recliner. Preferred to go back to bed. Assisted pt to bed. Encouraged IS and flutter valve. Discussed sternal precautions and staying in the tube. Encouraged walking as tolerated at Rehab. Discussed CRP 2 and referred to [facility] for after pt has completed his Rehab.    Luetta Nutting, RN BSN  08/04/2020 2:54 PM

## 2020-08-04 NOTE — TOC Initial Note (Signed)
Transition of Care [facility]) - Initial/Assessment Note    Patient Details  Name: [name] Rice MRN: [id number] Date of Birth: 01-27-1951  [facility] CM/SW Contact:    Carmina Miller, LCSWA Phone Number: 08/04/2020, 3:32 PM  Clinical Narrative:                 CSW received consult for possible SNF placement at time of discharge. CSW spoke with patient regarding PT recommendation of SNF placement at time of discharge. Patient reported that he is currently unable to care for himself at home given his current physical needs and fall risk. Patient expressed understanding of PT recommendation and is agreeable to SNF placement at time of discharge. Patient reports preference for [facility] CSW discussed insurance authorization process and provided Medicare SNF ratings list. Patient has received the COVID vaccines. Patient expressed being hopeful for rehab and to feel better soon. No further questions reported at this time. CSW to continue to follow and assist with discharge planning needs.  Expected Discharge Plan: Skilled Nursing Facility Barriers to Discharge: Continued Medical Work up   Patient Goals and CMS Choice Patient states their goals for this hospitalization and ongoing recovery are:: Get stronger CMS Medicare.gov Compare Post Acute Care list provided to:: Patient Choice offered to / list presented to : Patient  Expected Discharge Plan and Services Expected Discharge Plan: Skilled Nursing Facility In-house Referral: Clinical Social Work                                            Prior Living Arrangements/Services   Lives with:: Self Patient language and need for interpreter reviewed:: Yes Do you feel safe going back to the place where you live?: No   No one to help take care of  Need for Family Participation in Patient Care: Yes (Comment) Care giver support system in place?: No (comment)   Criminal Activity/Legal Involvement Pertinent to Current  Situation/Hospitalization: No - Comment as needed  Activities of Daily Living Home Assistive Devices/Equipment: None ADL Screening (condition at time of admission) Patient's cognitive ability adequate to safely complete daily activities?: Yes Is the patient deaf or have difficulty hearing?: No Does the patient have difficulty seeing, even when wearing glasses/contacts?: No Does the patient have difficulty concentrating, remembering, or making decisions?: No Patient able to express need for assistance with ADLs?: Yes Does the patient have difficulty dressing or bathing?: No Independently performs ADLs?: Yes (appropriate for developmental age) Does the patient have difficulty walking or climbing stairs?: No Weakness of Legs: None Weakness of Arms/Hands: None  Permission Sought/Granted Permission sought to share information with : Facility Medical sales representative, Case Manager, Family Supports Permission granted to share information with : Yes, Release of Information Signed  Share Information with NAME: [name] Rice  Permission granted to share info w AGENCY: SNFs  Permission granted to share info w Relationship: Brother  Permission granted to share info w Contact Information: [phone]  Emotional Assessment Appearance:: Appears stated age Attitude/Demeanor/Rapport: Gracious Affect (typically observed): Calm Orientation: : Oriented to Self, Oriented to Place, Oriented to  Time, Oriented to Situation Alcohol / Substance Use: Not Applicable Psych Involvement: No (comment)  Admission diagnosis:  Lightheadedness [R42] Near syncope [R55] Abdominal pain [R10.9] Postural dizziness with presyncope [R42, R55] Diarrhea, unspecified type [R19.7] Patient Active Problem List   Diagnosis Date Noted  . Postural dizziness with presyncope 07/24/2020  .  Hypokalemia 07/24/2020  . Diarrhea 07/24/2020  . Lightheadedness 07/24/2020  . A-fib ([facility])   . Long term current use of anticoagulant 01/14/2020   . Benign hypertension with CKD (chronic kidney disease) stage III 06/12/2019  . Pure hypercholesterolemia 06/12/2019  . Atrial fibrillation ([facility]) 09/25/2016   PCP:  [name], PA-C Pharmacy:   [facility] - [place], [place] - [phone] [place] [address] Phone: [phone] Fax: [phone]  [facility] - [place], [place] - [address] [place], [address] [place], [address] [place] [address] Phone: [phone] Fax: [phone]  [phone] - [place], [place] - [address] [place] [address] Phone: [phone] Fax: [phone]     Social Determinants of Health (SDOH) Interventions    Readmission Risk Interventions No flowsheet data found.

## 2020-08-04 NOTE — Progress Notes (Addendum)
CARDIAC REHAB PHASE I   PRE:  Rate/Rhythm: 95 afib w LBBB    BP: sitting 141/78    SaO2: 92-98 RA  MODE:  Ambulation: 230 ft   POST:  Rate/Rhythm: 120 afib    BP: sitting 148/100     SaO2: 96-98 RA  Pt needed verbal reminders to follow sternal precautions to get to EOB with mod assist. Min assist to stand, pt rocked which worked well. Initially fairly steady with RW however with fatigue and SOB pit increased unsteadiness. Rest x2. SaO2 stable in hall. Very fatigued on return to room and pt needed to sit on EOB to rest before moving to recliner. HR elevated.  Encouraged recliner, IS, flutter. We will f/u later to ambulate again. [phone]  Harriet Masson CES, ACSM 08/04/2020 9:13 AM

## 2020-08-04 NOTE — Discharge Summary (Deleted)
Physician Discharge Summary       [address] [place]       [name] [address]             [phone]    Patient ID: [name] Rice MRN: [id number] DOB/AGE: 1951/05/11 69 y.o.  Admit date: 07/23/2020 Discharge date: 08/05/2020  Admission Diagnoses: 1. Coronary artery disease 2.History of atrial fibrillation [facility])   Discharge Diagnoses:  1. S/p CABG x 4 2. Mild post op ileus 3. Expected post op blood loss anemia 4. History of benign hypertension with CKD (chronic kidney disease) stage III 5. History of pure hypercholesterolemia   Consults: None  Procedure (s):   Bi-atrial Maze procedure and left atrial appendage clipping  Coronary Artery Bypass Grafting x 4             Left Internal Mammary Artery to Distal Left Anterior Descending Coronary Artery; pedicled RIMA Graft to Posterior Descending Coronary Artery; left radial artery  Graft to Obtuse Marginal Branch of Left Circumflex Coronary Artery and ramus intermedius as a sequenced graft Bilateral IMA harvesting Left radial artery harvesting by Dr. Vickey Sages on 07/29/2020.  History of Presenting Illness: [name] Rice is a 69 yo man with approximately 5-year history of Atrial fibrillation status post attempted cardioversion.  He was admitted approximately, 3 weeks ago with dizziness.  He had a Holter monitor placed and was discharged home.  He had another episode then last week of not feeling well.  This was relatively indistinct but it led him to call 911.  Patient was admitted to the hospital and underwent Echocardiogram demonstrating reduced ejection fraction and and a nuclear stress test which suggested perfusion defect.  This prompted left heart catheterization demonstrating multivessel coronary artery disease disease.  Given the findings on left heart catheterization consultation was cardiothoracic surgery was consulted for consideration of CABG and atrial ablation. The patient was evaluated by Dr. Vickey Sages who felt patient  would be a candidate for surgical intervention.  He would require a washout due to direct thrombin inhibitors.  The risks and benefits of the procedure were explained to the patient and he was agreeable to proceed.  Brief Hospital Course:  The patient remained chest pain free during hospitalization.  The patient was taken to the operating room on 07/29/2020.  He underwent CABG x 4 utilizing LIMA to LAD, RIMA to PDA, and Sequential Radial artery to OM and Ramus Intermediate.  He also underwent MAZE procedure with clipping of LA Appendage.  He tolerated the procedure without difficulty and was taken to the SICU in stable condition.  He was extubated the evening of surgery.  During his stay in the SICU the patient was weaned off Epinephrine as tolerated.  He was started on isordil for his radial artery graft.  He required A. Pacing and his BB was held. His chest tubes and arterial lines were removed without difficulty. Follow up CXR showed a small left pleural effusion/atelectasis, but no evidence of pneumothorax.  He was started on oral Amiodarone for his history of Atrial Fibrillation and post MAZE procedure.  His home Xarelto was also restarted.  The patent was started on aggressive diuretic therapy with Lasix IV BID.  He was stable for transfer to the progressive care unit on 07/31/2020.  As a result he developed an elevation in his creatinine level with a peak of 1.31.  Due to this his regimen was adjusted.  He developed episodes of nausea and vomiting.  His abdominal exam had little to no  BS.  Abd film was obtained and showed mild gaseous dilatation of his bowels.  He was treated with reglan and his diet was advanced very slowly.  His symptoms resolved and his bowels moved without difficulty.  He is fairly deconditioned.  PT consult was obtained and recommended SNF. Ileus resolved on 9/26 with several bowel movements. TOC working on SNF placement.   We ask the SNF to please to do the following: 1. Please  obtain vital signs at least one time daily 2.Please weigh the patient daily. If he or she continues to gain weight or develops lower extremity edema, contact the office at [phone]. 3. Ambulate patient at least three times daily and please use sternal precautions.  Latest Vital Signs: Blood pressure (!) 154/81, pulse 88, temperature 97.6 F (36.4 C), temperature source Oral, resp. rate 20, height  (1.854 m), weight 117.9 kg, SpO2 93 %.  Physical Exam: Cardiovascular: RRR Pulmonary: Slightly diminished bibasilar breath sounds Abdomen: Soft, non tender, bowel sounds present. Extremities: Mild bilateral lower extremity edema. Motor/sensory intact LUE Wounds: Sternal and LUE wounds are clean and dry.  No erythema or signs of infection.  Discharge Condition: Stable and discharged to SNF.  Recent laboratory studies:  Lab Results  Component Value Date   WBC 12.4 (H) 08/03/2020   HGB 9.0 (L) 08/03/2020   HCT 28.7 (L) 08/03/2020   MCV 90.8 08/03/2020   PLT 330 08/03/2020   Lab Results  Component Value Date   NA 133 (L) 08/05/2020   K 3.7 08/05/2020   CL 96 (L) 08/05/2020   CO2 28 08/05/2020   CREATININE 1.24 08/05/2020   GLUCOSE 107 (H) 08/05/2020      Diagnostic Studies: DG Chest 1 View  Result Date: 07/31/2020 CLINICAL DATA:  Chest tube placement, open heart surgery EXAM: CHEST  1 VIEW COMPARISON:  None. FINDINGS: Right internal jugular Swan-Ganz catheter with its tip within the distal main pulmonary artery, bilateral chest tubes, mediastinal drain, are unchanged. Tiny right apical pneumothorax is present. Tiny left pleural effusion is noted, decreased since prior examination. Retrocardiac opacification persists likely representing partial left lower lobe collapse. Mild cardiomegaly is unchanged. Median sternotomy has been performed. Probable left atrial clipping has been performed. Pulmonary vascularity is normal. IMPRESSION: Right chest tube in place. Tiny right apical  pneumothorax now visualized, possibly related to patient positioning. Left chest tube in place.  Tiny, decreasing left pleural effusion. Persistent partial left lower lobe collapse. Electronically Signed   By: Helyn Numbers MD   On: 07/31/2020 06:47   DG Abd 1 View  Result Date: 07/24/2020 CLINICAL DATA:  Diarrhea for 5 days EXAM: ABDOMEN - 1 VIEW COMPARISON:  Nine hundred eighteen FINDINGS: 2 supine frontal views of the abdomen and pelvis are obtained. Bowel gas pattern is unremarkable without obstruction or ileus. No masses or abnormal calcifications. The lung bases are clear. IMPRESSION: 1. Unremarkable bowel gas pattern. Electronically Signed   By: Sharlet Salina M.D.   On: 07/24/2020 01:25   CARDIAC CATHETERIZATION  Result Date: 07/25/2020  LV end diastolic pressure is normal.  Ramus lesion is 95% stenosed.  Ost RCA to Prox RCA lesion is 80% stenosed.  Ost Cx to Prox Cx lesion is 95% stenosed.  Mid LM to Dist LM lesion is 40% stenosed.  Mid LAD-1 lesion is 80% stenosed.  Mid LAD-2 lesion is 90% stenosed.  1st Diag lesion is 80% stenosed.  Left Heart Catheterization 07/25/20: RCA: Proximal RCA 80% stenosis, large vessel with mild disease  in PL and PDA branches.  A secondary PL branch is subtotally occluded. LM: Distal 30-40% stenosis. LAD: LAD diffusely diseased, proximal diffuse 80% followed by tandem 90% stenosis.  Large D1 with moderate diffuse disease and proximal and mid tandem 70 and 80% stenosis.  Has secondary branches and is tortuous. Cx  and RI: Co-dominant Cx with Ostial circumflex 99% involving a moderate sized ramus with ostial 80 to 90% and proximal 90% stenosis. LV: Normal LVEDP.  No pressure gradient across the aortic valve. Patent LIMA and RIMA and RIMA has ostial 20-30% stenosis. Subclavian arteries widely patent. Right radial diffusely disease by [place]. 65mL contrast used. Recommendation: Patient needs evaluation for inpatient CABG in view of NSVT, ventricular standstill.  Radial  arterial conduits cannot be utilized as there was significant amount of atherosclerosis evident with ultrasound guidance.  Will discuss with surgery.  We could still consider Maze procedure and left atrial appendage ligation.   DG Chest Port 1 View  Result Date: 08/01/2020 CLINICAL DATA:  Shortness of breath. EXAM: PORTABLE CHEST 1 VIEW COMPARISON:  July 31, 2020. FINDINGS: Stable cardiomegaly. No pneumothorax is noted. Right lung is clear. Mild left basilar subsegmental atelectasis is noted with small left pleural effusion. Left-sided chest tube has been removed. Bony thorax is unremarkable. IMPRESSION: Mild left basilar subsegmental atelectasis with small left pleural effusion. No pneumothorax status post left-sided chest tube removal. Electronically Signed   By: Lupita Raider M.D.   On: 08/01/2020 08:14   DG Chest Port 1 View  Result Date: 07/30/2020 CLINICAL DATA:  Status post cardiac surgery. EXAM: PORTABLE CHEST 1 VIEW COMPARISON:  July 29, 2020. FINDINGS: Stable cardiomegaly. Endotracheal and nasogastric tubes have been removed. Right internal jugular Swan-Ganz catheter is noted with tip directed toward right pulmonary artery. No pneumothorax is noted. Bilateral chest tubes are noted. Right lung is clear. Mild left basilar atelectasis is noted with small left pleural effusion. Bony thorax is unremarkable. IMPRESSION: Bilateral chest tubes are noted without pneumothorax. Mild left basilar atelectasis is noted with small left pleural effusion. Endotracheal and nasogastric tubes have been removed. Electronically Signed   By: Lupita Raider M.D.   On: 07/30/2020 08:11   DG Chest Port 1 View  Result Date: 07/29/2020 CLINICAL DATA:  Post CABG x4. EXAM: PORTABLE CHEST 1 VIEW COMPARISON:  07/28/2020 FINDINGS: Endotracheal tube has tip approximately 4.2 cm above the carina. Enteric tube courses into the region of the stomach and off the film as tip is not visualized. Right IJ Swan-Ganz  catheter has tip over the main pulmonary artery segment. Bibasilar chest tubes are present. Catheter tip overlies the left heart Lungs are adequately inflated with minimal hazy prominence of the perihilar markings suggesting mild degree of vascular congestion. Mild linear atelectasis over the right midlung. No significant effusion or pneumothorax. Mild cardiomegaly. Remainder of the exam is unchanged. IMPRESSION: 1. Mild cardiomegaly with suggestion of mild vascular congestion. 2. Multiple tubes and lines as described. Electronically Signed   By: Elberta Fortis M.D.   On: 07/29/2020 14:50   DG CHEST PORT 1 VIEW  Result Date: 07/28/2020 CLINICAL DATA:  Preop for heart surgery. EXAM: PORTABLE CHEST 1 VIEW COMPARISON:  Chest x-ray 07/02/2020 FINDINGS: The heart size and mediastinal contours are unchanged. Aortic arch calcification. Well-defined stable 7 mm right lower lung zone pulmonary nodule consistent with known basilar granuloma. No focal consolidation. No pulmonary edema. No pleural effusion. No pneumothorax. No acute osseous abnormality. Multilevel degenerative changes of the spine. IMPRESSION: No active  cardiopulmonary disease. Electronically Signed   By: Tish Frederickson M.D.   On: 07/28/2020 21:06   DG Abd Portable 1V  Result Date: 08/01/2020 CLINICAL DATA:  Ileus.  History of constipation. EXAM: PORTABLE ABDOMEN - 1 VIEW COMPARISON:  07/24/2020. FINDINGS: Mildly distended loops of small and large bowel. Mild adynamic ileus cannot be excluded. No large stool volume. No free air identified. Degenerative change lumbar spine. IMPRESSION: Mildly distended loops of small large bowel. Mild adynamic ileus cannot be excluded. No large stool volume. Electronically Signed   By: Maisie Fus  Register   On: 08/01/2020 08:54   ECHOCARDIOGRAM COMPLETE  Result Date: 07/24/2020    ECHOCARDIOGRAM REPORT   Patient Name:   EESA JUSTISS Date of Exam: 07/24/2020 Medical Rec #:  [id number]      Height:       73.0 in Accession  #:    [id number]     Weight:       250.0 lb Date of Birth:  April 14, 1951      BSA:          2.366 m Patient Age:    69 years       BP:           128/87 mmHg Patient Gender: M              HR:           72 bpm. Exam Location:  Inpatient Procedure: 2D Echo Indications:    780.2 syncope  History:        Patient has prior history of Echocardiogram examinations, most                 recent 02/20/2020. Arrythmias:Atrial Fibrillation; Risk                 Factors:Hypertension. LBBB.  Sonographer:    Celene Skeen RDCS (AE) Referring Phys: [id number] ANASTASSIA DOUTOVA  Sonographer Comments: Image acquisition challenging due to patient body habitus and Image acquisition challenging due to respiratory motion. IMPRESSIONS  1. Poor acoustic windows limt study. Overall LVEF is normal ENdocardium is difficult to see, especially at apex. Consider limited echo with Definity to confirm wall motion. . Left ventricular ejection fraction, by estimation, is 55 to 60%. The left ventricle has normal function. Left ventricular endocardial border not optimally defined to evaluate regional wall motion. There is severe asymmetric left ventricular hypertrophy. Left ventricular diastolic parameters are indeterminate.  2. Right ventricular systolic function is normal. The right ventricular size is mildly enlarged.  3. The mitral valve is abnormal. Mild mitral valve regurgitation.  4. The aortic valve is abnormal. Aortic valve regurgitation is not visualized. Mild aortic valve sclerosis is present, with no evidence of aortic valve stenosis.  5. The inferior vena cava is normal in size with greater than 50% respiratory variability, suggesting right atrial pressure of 3 mmHg. FINDINGS  Left Ventricle: Poor acoustic windows limt study. Overall LVEF is normal ENdocardium is difficult to see, especially at apex. Consider limited echo with Definity to confirm wall motion. Left ventricular ejection fraction, by estimation, is 55 to 60%. The left ventricle has  normal function. Left ventricular endocardial border not optimally defined to evaluate regional wall motion. The left ventricular internal cavity size was normal in size. There is severe asymmetric left ventricular hypertrophy. Left ventricular diastolic parameters are indeterminate. Right Ventricle: The right ventricular size is mildly enlarged. Right vetricular wall thickness was not assessed. Right ventricular systolic function is normal. Left Atrium: Left atrial size  was normal in size. Right Atrium: Right atrial size was normal in size. Pericardium: There is no evidence of pericardial effusion. Mitral Valve: The mitral valve is abnormal. There is mild thickening of the mitral valve leaflet(s). Mild to moderate mitral annular calcification. Mild mitral valve regurgitation. Tricuspid Valve: The tricuspid valve is not well visualized. Tricuspid valve regurgitation is mild. Aortic Valve: The aortic valve is abnormal. Aortic valve regurgitation is not visualized. Mild aortic valve sclerosis is present, with no evidence of aortic valve stenosis. Aortic valve mean gradient measures 8.0 mmHg. Aortic valve peak gradient measures  13.5 mmHg. Aortic valve area, by VTI measures 2.27 cm. Pulmonic Valve: The pulmonic valve was not well visualized. Pulmonic valve regurgitation is not visualized. No evidence of pulmonic stenosis. Aorta: The aortic root is normal in size and structure. Venous: The inferior vena cava is normal in size with greater than 50% respiratory variability, suggesting right atrial pressure of 3 mmHg. IAS/Shunts: The interatrial septum was not assessed.  LEFT VENTRICLE PLAX 2D LVIDd:         4.02 cm LVIDs:         3.08 cm LV PW:         1.33 cm LV IVS:        13.00 cm LVOT diam:     2.20 cm LV SV:         75 LV SV Index:   32 LVOT Area:     3.80 cm  RIGHT VENTRICLE TAPSE (M-mode): 1.3 cm LEFT ATRIUM             Index       RIGHT ATRIUM           Index LA diam:        3.50 cm 1.48 cm/m  RA Area:      18.30 cm LA Vol (A2C):   66.1 ml 27.94 ml/m RA Volume:   43.80 ml  18.51 ml/m LA Vol (A4C):   33.8 ml 14.29 ml/m LA Biplane Vol: 49.3 ml 20.84 ml/m  AORTIC VALVE AV Area (Vmax):    2.15 cm AV Area (Vmean):   2.37 cm AV Area (VTI):     2.27 cm AV Vmax:           184.00 cm/s AV Vmean:          130.000 cm/s AV VTI:            0.332 m AV Peak Grad:      13.5 mmHg AV Mean Grad:      8.0 mmHg LVOT Vmax:         104.00 cm/s LVOT Vmean:        81.200 cm/s LVOT VTI:          0.198 m LVOT/AV VTI ratio: 0.60  AORTA Ao Root diam: 3.10 cm MITRAL VALVE MV Area (PHT): 2.99 cm    SHUNTS MV Decel Time: 254 msec    Systemic VTI:  0.20 m MV E velocity: 91.30 cm/s  Systemic Diam: 2.20 cm Dietrich PatesPaula Ross MD Electronically signed by Dietrich PatesPaula Ross MD Signature Date/Time: 07/24/2020/2:33:10 PM    Final    ECHO INTRAOPERATIVE TEE  Result Date: 07/30/2020  *INTRAOPERATIVE TRANSESOPHAGEAL REPORT *  Patient Name:   [name] Rice Date of Exam: 07/29/2020 Medical Rec #:  [id number]      Height:       73.0 in Accession #:    [id number]     Weight:  256.9 lb Date of Birth:  1950/11/11      BSA:          2.39 m Patient Age:    69 years       BP:           123/79 mmHg Patient Gender: M              HR:           62 bpm. Exam Location:  Anesthesiology Transesophogeal exam was perform intraoperatively during surgical procedure. Patient was closely monitored under general anesthesia during the entirety of examination. Indications:     CAD Native Vessel Sonographer:     Thurman Coyer RDCS (AE) Performing Phys: [id number] Ephriam Knuckles Z ATKINS Diagnosing Phys: Achille Rich MD Complications: No known complications during this procedure. POST-OP IMPRESSIONS - Left Ventricle: The left ventricle is unchanged from pre-bypass. - Right Ventricle: The right ventricle appears unchanged from pre-bypass. - Aorta: The aorta appears unchanged from pre-bypass. - Left Atrium: The left atrium appears unchanged from pre-bypass. - Left Atrial Appendage: Has been  surgically closed and the repair appears to be oversewn. - Aortic Valve: The aortic valve appears unchanged from pre-bypass. - Mitral Valve: The mitral valve appears unchanged from pre-bypass. - Tricuspid Valve: The tricuspid valve appears unchanged from pre-bypass. - Interatrial Septum: The interatrial septum appears unchanged from pre-bypass. - Interventricular Septum: The interventricular septum appears unchanged from pre-bypass. - Pericardium: The pericardium appears unchanged from pre-bypass. PRE-OP FINDINGS  Left Ventricle: The left ventricle has mildly reduced systolic function, with an ejection fraction of 45-50%. The cavity size was normal. There is mildly increased left ventricular wall thickness. No evidence of left ventricular regional wall motion abnormalities. Right Ventricle: The right ventricle has normal systolic function. The cavity was normal. There is no increase in right ventricular wall thickness. Left Atrium: Left atrial size was normal in size. The left atrial appendage is well visualized and there is no evidence of thrombus present. Right Atrium: Right atrial size was normal in size. Interatrial Septum: No atrial level shunt detected by color flow Doppler. Pericardium: There is no evidence of pericardial effusion. Mitral Valve: The mitral valve is normal in structure. Mitral valve regurgitation is mild by color flow Doppler. There is No evidence of mitral stenosis. Tricuspid Valve: The tricuspid valve was normal in structure. Tricuspid valve regurgitation is trivial by color flow Doppler. Aortic Valve: The aortic valve is tricuspid There is mild thickening of the aortic valve Aortic valve regurgitation was not visualized by color flow Doppler. There is no evidence of aortic valve stenosis. Right coronary cusp excursion is reduced. Pulmonic Valve: The pulmonic valve was normal in structure. Pulmonic valve regurgitation is not visualized by color flow Doppler. Aorta: The aortic root, ascending  aorta and aortic arch are normal in size and structure.  Achille Rich MD Electronically signed by Achille Rich MD Signature Date/Time: 07/30/2020/6:58:54 PM    Final    VAS US DOPPLER PRE CABG  Result Date: 07/28/2020 PREOPERATIVE VASCULAR EVALUATION  Indications:      Pre-CABG. Risk Factors:     Hypertension. Comparison Study: No prior studies. Performing Technologist: Olen Cordial RVT  Examination Guidelines: A complete evaluation includes B-mode imaging, spectral Doppler, color Doppler, and power Doppler as needed of all accessible portions of each vessel. Bilateral testing is considered an integral part of a complete examination. Limited examinations for reoccurring indications may be performed as noted.  Right Carotid Findings: +----------+--------+--------+--------+-----------------------+--------+  PSV cm/sEDV cm/sStenosisDescribe               Comments +----------+--------+--------+--------+-----------------------+--------+ CCA Prox  143     15              smooth and heterogenous         +----------+--------+--------+--------+-----------------------+--------+ CCA Distal66      10              smooth and heterogenous         +----------+--------+--------+--------+-----------------------+--------+ ICA Prox  50      19              calcific                        +----------+--------+--------+--------+-----------------------+--------+ ICA Distal96      31                                     tortuous +----------+--------+--------+--------+-----------------------+--------+ ECA       128     15                                              +----------+--------+--------+--------+-----------------------+--------+ Portions of this table do not appear on this page. +----------+--------+-------+--------+------------+           PSV cm/sEDV cmsDescribeArm Pressure +----------+--------+-------+--------+------------+ Subclavian101                                  +----------+--------+-------+--------+------------+ +---------+--------+---+--------+--+---------+ VertebralPSV cm/s156EDV cm/s34Antegrade +---------+--------+---+--------+--+---------+ Left Carotid Findings: +----------+--------+--------+--------+-----------------------+--------+           PSV cm/sEDV cm/sStenosisDescribe               Comments +----------+--------+--------+--------+-----------------------+--------+ CCA Prox  79      18              smooth and heterogenous         +----------+--------+--------+--------+-----------------------+--------+ CCA Distal53      10              smooth and heterogenous         +----------+--------+--------+--------+-----------------------+--------+ ICA Prox  237     77      60-79%  calcific                        +----------+--------+--------+--------+-----------------------+--------+ ICA Mid   199     37              smooth and heterogenous         +----------+--------+--------+--------+-----------------------+--------+ ICA Distal97      17                                     tortuous +----------+--------+--------+--------+-----------------------+--------+ ECA       99      11                                              +----------+--------+--------+--------+-----------------------+--------+ +----------+--------+--------+--------+------------+ SubclavianPSV cm/sEDV cm/sDescribeArm Pressure +----------+--------+--------+--------+------------+  128                                  +----------+--------+--------+--------+------------+ +---------+--------+--+--------+--+---------+ VertebralPSV cm/s53EDV cm/s24Antegrade +---------+--------+--+--------+--+---------+  ABI Findings: +--------+------------------+-----+---------+--------+ Right   Rt Pressure (mmHg)IndexWaveform Comment  +--------+------------------+-----+---------+--------+ [id number]                    triphasic          +--------+------------------+-----+---------+--------+ PTA     172               1.10 biphasic          +--------+------------------+-----+---------+--------+ DP      145               0.93 biphasic          +--------+------------------+-----+---------+--------+ +--------+------------------+-----+---------+-------+ Left    Lt Pressure (mmHg)IndexWaveform Comment +--------+------------------+-----+---------+-------+ Brachial150                    triphasic        +--------+------------------+-----+---------+-------+ PTA     250               1.60 triphasic        +--------+------------------+-----+---------+-------+ DP      161               1.03 biphasic         +--------+------------------+-----+---------+-------+ +-------+---------------+----------------+ ABI/TBIToday's ABI/TBIPrevious ABI/TBI +-------+---------------+----------------+ Right  1.1                             +-------+---------------+----------------+ Left   1.6                             +-------+---------------+----------------+  Right Doppler Findings: +--------+--------+-----+---------+--------+ Site    PressureIndexDoppler  Comments +--------+--------+-----+---------+--------+ [id number]          triphasic         +--------+--------+-----+---------+--------+ Radial               triphasic         +--------+--------+-----+---------+--------+ Ulnar                triphasic         +--------+--------+-----+---------+--------+  Left Doppler Findings: +--------+--------+-----+---------+--------+ Site    PressureIndexDoppler  Comments +--------+--------+-----+---------+--------+ [id number]          triphasic         +--------+--------+-----+---------+--------+ Radial               triphasic         +--------+--------+-----+---------+--------+ Ulnar                triphasic         +--------+--------+-----+---------+--------+  Summary: Right  Carotid: Velocities in the right ICA are consistent with a 1-39% stenosis. Left Carotid: Velocities in the left ICA are consistent with a 60-79% stenosis. Vertebrals: Bilateral vertebral arteries demonstrate antegrade flow. Right ABI: Resting right ankle-brachial index is within normal range. No evidence of significant right lower extremity arterial disease. Left ABI: Resting left ankle-brachial index indicates noncompressible left lower extremity arteries. Right Upper Extremity: Doppler waveform obliterate with right radial compression. Doppler waveform obliterate with right ulnar compression. Left Upper Extremity: Doppler waveform obliterate with left radial compression. Doppler waveform obliterate with left ulnar compression.  Electronically signed by Fabienne Bruns MD on 07/28/2020 at 5:18:01  PM.    Final    Discharge Instructions    Amb Referral to Cardiac Rehabilitation   Complete by: As directed    Diagnosis: CABG   CABG X ___: 4 Comment - MAZE   After initial evaluation and assessments completed: Virtual Based Care may be provided alone or in conjunction with Phase 2 Cardiac Rehab based on patient barriers.: Yes      Discharge Medications: Allergies as of 08/05/2020   No Known Allergies     Medication List    STOP taking these medications   amLODipine 10 MG tablet Commonly known as: NORVASC   olmesartan 40 MG tablet Commonly known as: BENICAR     TAKE these medications   acetaminophen 500 MG tablet Commonly known as: TYLENOL Take 2 tablets (1,000 mg total) by mouth every 6 (six) hours as needed.   amiodarone 200 MG tablet Commonly known as: PACERONE Take 1 tablet (200 mg total) by mouth daily.   aspirin 81 MG EC tablet Take 1 tablet (81 mg total) by mouth daily. Swallow whole.   guaiFENesin 600 MG 12 hr tablet Commonly known as: MUCINEX Take 1 tablet (600 mg total) by mouth 2 (two) times daily as needed.   isosorbide dinitrate 10 MG tablet Commonly known as:  ISORDIL Take 1 tablet (10 mg total) by mouth 3 (three) times daily. Take until finished   metoprolol tartrate 25 MG tablet Commonly known as: LOPRESSOR Take 0.5 tablets (12.5 mg total) by mouth 2 (two) times daily. What changed: how much to take   oxyCODONE 5 MG immediate release tablet Commonly known as: Oxy IR/ROXICODONE Take 1 tablet (5 mg total) by mouth every 4 (four) hours as needed for severe pain.   potassium chloride SA 20 MEQ tablet Commonly known as: KLOR-CON Take 1 tablet (20 mEq total) by mouth daily. Start taking on: August 06, 2020   rosuvastatin 40 MG tablet Commonly known as: CRESTOR Take 1 tablet (40 mg total) by mouth daily. What changed:   medication strength  how much to take   Xarelto 20 MG Tabs tablet Generic drug: rivaroxaban TAKE 1 TABLET BY MOUTH IN  THE EVENING AFTER DINNER What changed: See the new instructions.      The patient has been discharged on:   1.Beta Blocker:  Yes [ x  ]                              No   [   ]                              If No, reason:  2.Ace Inhibitor/ARB: Yes [   ]                                     No  [ x   ]                                     If No, reason: Mildly elevated creatinine  3.Statin:   Yes [ x  ]                  No  [   ]  If No, reason:  4.Ecasa:  Yes  [  x ]                  No   [   ]                  If No, reason:  Follow Up Appointments:  Follow-up Information    Linden Dolin, MD Follow up on 08/11/2020.   Specialty: Cardiothoracic Surgery Why: Appointment is at 3:30 Contact information: [address] [place] [address] [phone]        Tessa Lerner, DO Follow up on 08/11/2020.   Specialties: Cardiology, Radiology, Vascular Surgery Why: Appointment is at 11:45 Contact information: [address] [place] [address] [phone]        Danice Goltz, [place]. Go on 08/22/2020.   Specialty: Cardiology Why:  Appointment time is at 11:30 am Contact information: [address] [place] [address] [phone]               Signed: Lelon Huh [facility] 08/05/2020, 7:58 AM

## 2020-08-04 NOTE — Progress Notes (Addendum)
      [address]       [place] [address]             [phone]        6 Days Post-Op Procedure(s) (LRB): CORONARY ARTERY BYPASS GRAFTING (CABG) TIMES FOUR USING BILATERAL INTERNAL MAMMARIES AND LEFT RADIAL ARTERY (N/A) MAZE (N/A) CLIPPING OF ATRIAL APPENDAGE (Left) RADIAL ARTERY HARVEST (Left) TRANSESOPHAGEAL ECHOCARDIOGRAM (TEE) (N/A) INDOCYANINE GREEN FLUORESCENCE IMAGING (ICG) (N/A)  Subjective: Patient with loose stools. He denies abdominal pain or nausea. Patient with difficulty sleeping.  Objective: Vital signs in last 24 hours: Temp:  [97.5 F (36.4 C)-97.9 F (36.6 C)] 97.5 F (36.4 C) (09/27 0352) Pulse Rate:  [92-96] 96 (09/27 0352) Cardiac Rhythm: Normal sinus rhythm (09/27 0414) Resp:  [18-20] 18 (09/26 1948) BP: (122-154)/(64-86) 140/81 (09/27 0352) SpO2:  [99 %-100 %] 100 % (09/26 1552) Weight:  [120.1 kg] 120.1 kg (09/27 0352)  Pre op weight 116.5  kg Current Weight  08/04/20 120.1 kg       Intake/Output from previous day: 09/26 0701 - 09/27 0700 In: 720 [P.O.:720] Out: 900 [Urine:900]   Physical Exam:  Cardiovascular: Slightly tachy Pulmonary: Slightly diminished bibasilar breath sounds Abdomen: Soft, non tender, bowel sounds present. Extremities: Mild bilateral lower extremity edema. Wounds: Clean and dry.  No erythema or signs of infection.  Lab Results: CBC: Recent Labs    08/02/20 0432 08/03/20 0104  WBC 14.0* 12.4*  HGB 8.4* 9.0*  HCT 27.6* 28.7*  PLT 268 330   BMET:  Recent Labs    08/02/20 0432 08/03/20 0104  NA 131* 134*  K 3.8 3.4*  CL 99 98  CO2 26 25  GLUCOSE 97 105*  BUN 25* 23  CREATININE 1.29* 1.31*  CALCIUM 8.1* 8.3*    PT/INR:  Lab Results  Component Value Date   INR 1.7 (H) 07/29/2020   INR 1.2 07/29/2020   ABG:  INR: Will add last result for INR, ABG once components are confirmed Will add last 4 CBG results once components are confirmed  Assessment/Plan:  1. CV -  Chronic a fib. On Amiodarone 200 mg bid, Isordil 10 mg tid, and Rivaroxaban 20 mg daily. HR in the low 100's this am so will start low dose Lopressor. 2.  Pulmonary - On room air. Encourage incentive spirometer 3. Volume Overload - Will give IV Lasix this am 4.  Expected post op acute blood loss anemia - H and H yesterday 9 and 28.7. 5. Creatinine yesterday 1.31. Creatinine upon admission 1.2 6. GI-may have had mild ileus previously. Stop stool softeners 7. Benadryl ordered PRN but patient requesting nightly to help sleep so will change 8. Deconditioned-PT/OT. Will need CIR or SNF when ready for discharge  Donielle M ZimmermanPA-C 08/04/2020,7:04 AM Pt seen and examined; chart reviewed. Agree with documentation and with suggestion for CIR/SNF.  Would be beneficial to f/u in Afib clinic after discharge for long-term management of AF. Seidy Labreck Z. Vickey Sages, MD [phone]

## 2020-08-05 ENCOUNTER — Inpatient Hospital Stay (HOSPITAL_COMMUNITY): Payer: Medicare Other

## 2020-08-05 LAB — BASIC METABOLIC PANEL
Anion gap: 9 (ref 5–15)
BUN: 15 mg/dL (ref 8–23)
CO2: 28 mmol/L (ref 22–32)
Calcium: 8.1 mg/dL — ABNORMAL LOW (ref 8.9–10.3)
Chloride: 96 mmol/L — ABNORMAL LOW (ref 98–111)
Creatinine, Ser: 1.24 mg/dL (ref 0.61–1.24)
GFR calc Af Amer: >60 mL/min (ref >60)
GFR calc non Af Amer: 60 mL/min — ABNORMAL LOW (ref >60)
Glucose, Bld: 107 mg/dL — ABNORMAL HIGH (ref 70–99)
Potassium: 3.7 mmol/L (ref 3.5–5.1)
Sodium: 133 mmol/L — ABNORMAL LOW (ref 135–145)

## 2020-08-05 LAB — SARS CORONAVIRUS 2 BY RT PCR (HOSPITAL ORDER, PERFORMED IN ~~LOC~~ HOSPITAL LAB): SARS Coronavirus 2: NEGATIVE

## 2020-08-05 MED ORDER — POTASSIUM CHLORIDE CRYS ER 20 MEQ PO TBCR
30.0000 meq | EXTENDED_RELEASE_TABLET | Freq: Two times a day (BID) | ORAL | Status: DC
  Administered 2020-08-05: 30 meq via ORAL
  Filled 2020-08-05: qty 1

## 2020-08-05 MED ORDER — ROSUVASTATIN CALCIUM 40 MG PO TABS: 40.0000 mg | ORAL_TABLET | Freq: Every day | ORAL | Status: DC

## 2020-08-05 MED ORDER — POTASSIUM CHLORIDE CRYS ER 20 MEQ PO TBCR: 20.0000 meq | EXTENDED_RELEASE_TABLET | Freq: Every day | ORAL | Status: DC

## 2020-08-05 MED ORDER — PANTOPRAZOLE SODIUM 40 MG PO TBEC: 80.0000 mg | DELAYED_RELEASE_TABLET | Freq: Every day | ORAL | Status: DC

## 2020-08-05 MED ORDER — OXYCODONE HCL 5 MG PO TABS
5.0000 mg | ORAL_TABLET | ORAL | 0 refills | Status: DC | PRN
Start: 2020-08-05 — End: 2020-08-15

## 2020-08-05 MED ORDER — FUROSEMIDE 10 MG/ML IJ SOLN
40.0000 mg | Freq: Once | INTRAMUSCULAR | Status: AC
  Administered 2020-08-05: 40 mg via INTRAVENOUS
  Filled 2020-08-05: qty 4

## 2020-08-05 MED ORDER — ISOSORBIDE DINITRATE 10 MG PO TABS
10.0000 mg | ORAL_TABLET | Freq: Three times a day (TID) | ORAL | Status: DC
Start: 2020-08-05 — End: 2020-08-05

## 2020-08-05 MED ORDER — PANTOPRAZOLE SODIUM 40 MG PO TBEC
80.0000 mg | DELAYED_RELEASE_TABLET | Freq: Every day | ORAL | Status: DC
  Administered 2020-08-05: 80 mg via ORAL
  Filled 2020-08-05: qty 2

## 2020-08-05 MED ORDER — METOPROLOL TARTRATE 25 MG PO TABS: 12.5000 mg | ORAL_TABLET | Freq: Two times a day (BID) | ORAL | Status: DC

## 2020-08-05 MED ORDER — AMIODARONE HCL 200 MG PO TABS: 200.0000 mg | ORAL_TABLET | Freq: Every day | ORAL | Status: DC

## 2020-08-05 MED ORDER — CHLORHEXIDINE GLUCONATE 0.12 % MT SOLN
OROMUCOSAL | Status: AC
  Filled 2020-08-05: qty 15

## 2020-08-05 MED ORDER — ISOSORBIDE DINITRATE 10 MG PO TABS
10.0000 mg | ORAL_TABLET | Freq: Three times a day (TID) | ORAL | Status: DC
Start: 2020-08-05 — End: 2020-08-15

## 2020-08-05 MED ORDER — CEFAZOLIN SODIUM-DEXTROSE 2-4 GM/100ML-% IV SOLN
INTRAVENOUS | Status: AC
  Filled 2020-08-05: qty 100

## 2020-08-05 NOTE — TOC Transition Note (Signed)
Transition of Care [facility]) - CM/SW Discharge Note   Patient Details  Name: [name] Rice MRN: [id number] Date of Birth: 1951/04/13  [facility] CM/SW Contact:  Carmina Miller, LCSWA Phone Number: 08/05/2020, 12:01 PM   Clinical Narrative:    Patient will DC to:  [facility]   Anticipated DC date:  08/05/20 Family notified: [name] Rice Transport by: [name] Rice   Per MD patient ready for DC to [facility]. RN to call report prior to discharge [phone], ask for 100 hall nurses station, patient going to room 112. RN, patient, patient's family, and facility notified of DC. Discharge Summary and FL2 sent to facility. DC packet on chart. Ambulance transport requested for patient.   CSW will sign off for now as social work intervention is no longer needed. Please consult [place] again if new needs arise.     Final next level of care: Skilled Nursing Facility Barriers to Discharge: Barriers Resolved   Patient Goals and CMS Choice Patient states their goals for this hospitalization and ongoing recovery are:: Get stronger CMS Medicare.gov Compare Post Acute Care list provided to:: Patient Choice offered to / list presented to : Patient  Discharge Placement PASRR number recieved: 08/04/20            Patient [facility] bed at: [facility] Patient to be transferred to facility by: [facility] Name of family member notified: [name] Rice Patient and family notified of of transfer: 08/05/20  Discharge Plan and Services In-house Referral: Clinical Social Work                                   Social Determinants of Health (SDOH) Interventions     Readmission Risk Interventions No flowsheet data found.

## 2020-08-05 NOTE — Care Management Important Message (Signed)
Important Message  Patient Details  Name: [name] Rice MRN: [id number] Date of Birth: 04-Mar-1951   Medicare Important Message Given:  Yes     Dorena Bodo 08/05/2020, 3:31 PM

## 2020-08-05 NOTE — Progress Notes (Signed)
Sutures on chest tube site removed tolerated well. Site cleansed with iodine. Site clean and dry

## 2020-08-05 NOTE — Progress Notes (Signed)
Ready for transfer to [facility] nursing home. Report given to accepting facility, spoke with [facility] LPN. Awaiting transport.

## 2020-08-05 NOTE — Progress Notes (Signed)
Occupational Therapy Treatment Patient Details Name: [name] Rice MRN: [id number] DOB: 02-28-1951 Today's Date: 08/05/2020    History of present illness Pt is a 69 yo male admitted with light headedness and found to have CAD. Underwent CABG x4 on 9/21. PMH - a.fib, bradycardia, HTN, obesity, LBBB, chronic systolic CHF   OT comments  Patient continues to make steady progress towards goals in skilled OT session. Patient's session encompassed transfers, ADLs, and functional mobility in order to increase overall activity tolerance and endurance. Pt continues to progress with all tasks, with his greatest limitation being adherence to sternal precautions without cues. Discharge remains appropriate, will continue to follow acutely.    Follow Up Recommendations  SNF;Supervision/Assistance - 24 hour    Equipment Recommendations  3 in 1 bedside commode    Recommendations for Other Services      Precautions / Restrictions Precautions Precautions: Fall;Sternal Restrictions Weight Bearing Restrictions: No       Mobility Bed Mobility Overal bed mobility: Needs Assistance Bed Mobility: Supine to Sit     Supine to sit: Min guard     General bed mobility comments: Pt received in bathroom and seated in recliner at end of session.  Transfers Overall transfer level: Needs assistance Equipment used: Rolling walker (2 wheeled) Transfers: Sit to/from Stand Sit to Stand: Mod assist         General transfer comment: cues for sternal precautions, assist to power up    Balance Overall balance assessment: Needs assistance Sitting-balance support: Feet supported;No upper extremity supported Sitting balance-Leahy Scale: Good     Standing balance support: Bilateral upper extremity supported;During functional activity Standing balance-Leahy Scale: Poor Standing balance comment: reliant on BUE support                           ADL either performed or assessed with clinical  judgement   ADL Overall ADL's : Needs assistance/impaired     Grooming: Wash/dry hands;Wash/dry face;Oral care;Set up;Standing;Brushing hair                               Functional mobility during ADLs: Moderate assistance;Rolling walker General ADL Comments: Cues to adhere to sternal precautions, but making functional gains towards increased independence and activity tolerance     Vision       Perception     Praxis      Cognition Arousal/Alertness: Awake/alert Behavior During Therapy: WFL for tasks assessed/performed Overall Cognitive Status: Within Functional Limits for tasks assessed                                          Exercises     Shoulder Instructions       General Comments SpO2 > 90% on RA. 2/4 DOE    Pertinent Vitals/ Pain       Pain Assessment: No/denies pain  Home Living                                          Prior Functioning/Environment              Frequency  Min 2X/week        Progress Toward Goals  OT Goals(current goals can now  be found in the care plan section)  Progress towards OT goals: Progressing toward goals  Acute Rehab OT Goals Patient Stated Goal: to be safe and independent OT Goal Formulation: With patient Time For Goal Achievement: 08/15/20 Potential to Achieve Goals: Good  Plan Discharge plan remains appropriate    Co-evaluation                 AM-PAC OT "6 Clicks" Daily Activity     Outcome Measure   Help from another person eating meals?: None Help from another person taking care of personal grooming?: None Help from another person toileting, which includes using toliet, bedpan, or urinal?: A Little Help from another person bathing (including washing, rinsing, drying)?: A Little Help from another person to put on and taking off regular upper body clothing?: A Little Help from another person to put on and taking off regular lower body clothing?: A  Lot 6 Click Score: 19    End of Session Equipment Utilized During Treatment: Gait belt;Rolling walker  OT Visit Diagnosis: Unsteadiness on feet (R26.81);Other abnormalities of gait and mobility (R26.89);Pain   Activity Tolerance Patient tolerated treatment well   Patient Left in bed;with call bell/phone within reach   Nurse Communication Mobility status        Time: 5093-2671 OT Time Calculation (min): 19 min  Charges: OT General Charges $OT Visit: 1 Visit OT Treatments $Self Care/Home Management : 8-22 mins  Pollyann Glen E. Libertie Hausler, COTA/L Acute Rehabilitation Services [phone]   Cherlyn Cushing 08/05/2020, 1:28 PM

## 2020-08-05 NOTE — Discharge Instructions (Signed)
We ask the SNF to please to do the following: 1. Please obtain vital signs at least one time daily 2.Please weigh the patient daily. If he or she continues to gain weight or develops lower extremity edema, contact the office at [phone]. 3. Ambulate patient at least three times daily and please use sternal precautions.  Discharge Instructions: 1. You may shower, please wash incisions daily with soap and water and keep dry.  If you wish to cover wounds with dressing you may do so but please keep clean and change daily.  No tub baths or swimming until incisions have completely healed.  If your incisions become red or develop any drainage please call our office at [phone]  2. No Driving until cleared by Dr. Vickey Sages' office and you are no longer using narcotic pain medications  3. Monitor your weight daily.. Please use the same scale and weigh at same time... If you gain 5-10 lbs in 48 hours with associated lower extremity swelling, please contact our office at [phone]  4. Fever of 101.5 for at least 24 hours with no source, please contact our office at [phone]  5. Activity- up as tolerated, please walk at least 3 times per day.  Avoid strenuous activity, no lifting, pushing, or pulling with your arms over 8-10 lbs for a minimum of 6 weeks  6. If any questions or concerns arise, please do not hesitate to contact our office at [phone]

## 2020-08-05 NOTE — Discharge Summary (Signed)
Physician Discharge Summary       [address] [place]       [name] [address]             [phone]    Patient ID: [name] Rice MRN: [id number] DOB/AGE: 1951/05/11 69 y.o.  Admit date: 07/23/2020 Discharge date: 08/05/2020  Admission Diagnoses: 1. Coronary artery disease 2.History of atrial fibrillation [facility])   Discharge Diagnoses:  1. S/p CABG x 4 2. Mild post op ileus 3. Expected post op blood loss anemia 4. History of benign hypertension with CKD (chronic kidney disease) stage III 5. History of pure hypercholesterolemia   Consults: None  Procedure (s):   Bi-atrial Maze procedure and left atrial appendage clipping  Coronary Artery Bypass Grafting x 4             Left Internal Mammary Artery to Distal Left Anterior Descending Coronary Artery; pedicled RIMA Graft to Posterior Descending Coronary Artery; left radial artery  Graft to Obtuse Marginal Branch of Left Circumflex Coronary Artery and ramus intermedius as a sequenced graft Bilateral IMA harvesting Left radial artery harvesting by Dr. Vickey Sages on 07/29/2020.  History of Presenting Illness: [name] Rice is a 69 yo man with approximately 5-year history of Atrial fibrillation status post attempted cardioversion.  He was admitted approximately, 3 weeks ago with dizziness.  He had a Holter monitor placed and was discharged home.  He had another episode then last week of not feeling well.  This was relatively indistinct but it led him to call 911.  Patient was admitted to the hospital and underwent Echocardiogram demonstrating reduced ejection fraction and and a nuclear stress test which suggested perfusion defect.  This prompted left heart catheterization demonstrating multivessel coronary artery disease disease.  Given the findings on left heart catheterization consultation was cardiothoracic surgery was consulted for consideration of CABG and atrial ablation. The patient was evaluated by Dr. Vickey Sages who felt patient  would be a candidate for surgical intervention.  He would require a washout due to direct thrombin inhibitors.  The risks and benefits of the procedure were explained to the patient and he was agreeable to proceed.  Brief Hospital Course:  The patient remained chest pain free during hospitalization.  The patient was taken to the operating room on 07/29/2020.  He underwent CABG x 4 utilizing LIMA to LAD, RIMA to PDA, and Sequential Radial artery to OM and Ramus Intermediate.  He also underwent MAZE procedure with clipping of LA Appendage.  He tolerated the procedure without difficulty and was taken to the SICU in stable condition.  He was extubated the evening of surgery.  During his stay in the SICU the patient was weaned off Epinephrine as tolerated.  He was started on isordil for his radial artery graft.  He required A. Pacing and his BB was held. His chest tubes and arterial lines were removed without difficulty. Follow up CXR showed a small left pleural effusion/atelectasis, but no evidence of pneumothorax.  He was started on oral Amiodarone for his history of Atrial Fibrillation and post MAZE procedure.  His home Xarelto was also restarted.  The patent was started on aggressive diuretic therapy with Lasix IV BID.  He was stable for transfer to the progressive care unit on 07/31/2020.  As a result he developed an elevation in his creatinine level with a peak of 1.31.  Due to this his regimen was adjusted.  He developed episodes of nausea and vomiting.  His abdominal exam had little to no  BS.  Abd film was obtained and showed mild gaseous dilatation of his bowels.  He was treated with reglan and his diet was advanced very slowly.  His symptoms resolved and his bowels moved without difficulty.  He is fairly deconditioned.  PT consult was obtained and recommended SNF. Ileus resolved on 9/26 with several bowel movements. TOC working on SNF placement. COVID Test done 09/28 was NEGATIVE.As discussed with Dr. Vickey Sages,  patient is surgically stable for discharge to SNF.  We ask the SNF to please to do the following: 1. Please obtain vital signs at least one time daily 2.Please weigh the patient daily. If he or she continues to gain weight or develops lower extremity edema, contact the office at [phone]. 3. Ambulate patient at least three times daily and please use sternal precautions.  Latest Vital Signs: Blood pressure 130/73, pulse 81, temperature 98.2 F (36.8 C), temperature source Oral, resp. rate 20, height  (1.854 m), weight 117.9 kg, SpO2 92 %.  Physical Exam: Cardiovascular: RRR Pulmonary: Slightly diminished bibasilar breath sounds Abdomen: Soft, non tender, bowel sounds present. Extremities: Mild bilateral lower extremity edema. Motor/sensory intact LUE Wounds: Sternal and LUE wounds are clean and dry.  No erythema or signs of infection.  Discharge Condition: Stable and discharged to SNF.  Recent laboratory studies:  Lab Results  Component Value Date   WBC 12.4 (H) 08/03/2020   HGB 9.0 (L) 08/03/2020   HCT 28.7 (L) 08/03/2020   MCV 90.8 08/03/2020   PLT 330 08/03/2020   Lab Results  Component Value Date   NA 133 (L) 08/05/2020   K 3.7 08/05/2020   CL 96 (L) 08/05/2020   CO2 28 08/05/2020   CREATININE 1.24 08/05/2020   GLUCOSE 107 (H) 08/05/2020      Diagnostic Studies: DG Chest 1 View  Result Date: 07/31/2020 CLINICAL DATA:  Chest tube placement, open heart surgery EXAM: CHEST  1 VIEW COMPARISON:  None. FINDINGS: Right internal jugular Swan-Ganz catheter with its tip within the distal main pulmonary artery, bilateral chest tubes, mediastinal drain, are unchanged. Tiny right apical pneumothorax is present. Tiny left pleural effusion is noted, decreased since prior examination. Retrocardiac opacification persists likely representing partial left lower lobe collapse. Mild cardiomegaly is unchanged. Median sternotomy has been performed. Probable left atrial clipping has  been performed. Pulmonary vascularity is normal. IMPRESSION: Right chest tube in place. Tiny right apical pneumothorax now visualized, possibly related to patient positioning. Left chest tube in place.  Tiny, decreasing left pleural effusion. Persistent partial left lower lobe collapse. Electronically Signed   By: Helyn Numbers MD   On: 07/31/2020 06:47   DG Chest 2 View  Result Date: 08/05/2020 CLINICAL DATA:  Pleural effusion EXAM: CHEST - 2 VIEW COMPARISON:  August 01, 2020 FINDINGS: There is a stable small left pleural effusion with atelectatic change in the left base. There are scattered small granulomas, stable. Heart is enlarged, stable, with evidence of previous coronary artery bypass grafting. Left atrial appendage clamp present. No adenopathy. No pneumothorax. There is degenerative change in the thoracic spine. IMPRESSION: Stable small left pleural effusion with left base atelectasis. No new opacity. There are scattered granulomas. Stable cardiac prominence with postoperative changes. No adenopathy evident. Electronically Signed   By: Bretta Bang III M.D.   On: 08/05/2020 08:15   DG Abd 1 View  Result Date: 07/24/2020 CLINICAL DATA:  Diarrhea for 5 days EXAM: ABDOMEN - 1 VIEW COMPARISON:  Nine hundred eighteen FINDINGS: 2 supine frontal views  of the abdomen and pelvis are obtained. Bowel gas pattern is unremarkable without obstruction or ileus. No masses or abnormal calcifications. The lung bases are clear. IMPRESSION: 1. Unremarkable bowel gas pattern. Electronically Signed   By: Sharlet Salina M.D.   On: 07/24/2020 01:25   CARDIAC CATHETERIZATION  Result Date: 07/25/2020  LV end diastolic pressure is normal.  Ramus lesion is 95% stenosed.  Ost RCA to Prox RCA lesion is 80% stenosed.  Ost Cx to Prox Cx lesion is 95% stenosed.  Mid LM to Dist LM lesion is 40% stenosed.  Mid LAD-1 lesion is 80% stenosed.  Mid LAD-2 lesion is 90% stenosed.  1st Diag lesion is 80% stenosed.  Left  Heart Catheterization 07/25/20: RCA: Proximal RCA 80% stenosis, large vessel with mild disease in PL and PDA branches.  A secondary PL branch is subtotally occluded. LM: Distal 30-40% stenosis. LAD: LAD diffusely diseased, proximal diffuse 80% followed by tandem 90% stenosis.  Large D1 with moderate diffuse disease and proximal and mid tandem 70 and 80% stenosis.  Has secondary branches and is tortuous. Cx  and RI: Co-dominant Cx with Ostial circumflex 99% involving a moderate sized ramus with ostial 80 to 90% and proximal 90% stenosis. LV: Normal LVEDP.  No pressure gradient across the aortic valve. Patent LIMA and RIMA and RIMA has ostial 20-30% stenosis. Subclavian arteries widely patent. Right radial diffusely disease by [place]. 29mL contrast used. Recommendation: Patient needs evaluation for inpatient CABG in view of NSVT, ventricular standstill.  Radial arterial conduits cannot be utilized as there was significant amount of atherosclerosis evident with ultrasound guidance.  Will discuss with surgery.  We could still consider Maze procedure and left atrial appendage ligation.   DG Chest Port 1 View  Result Date: 08/01/2020 CLINICAL DATA:  Shortness of breath. EXAM: PORTABLE CHEST 1 VIEW COMPARISON:  July 31, 2020. FINDINGS: Stable cardiomegaly. No pneumothorax is noted. Right lung is clear. Mild left basilar subsegmental atelectasis is noted with small left pleural effusion. Left-sided chest tube has been removed. Bony thorax is unremarkable. IMPRESSION: Mild left basilar subsegmental atelectasis with small left pleural effusion. No pneumothorax status post left-sided chest tube removal. Electronically Signed   By: Lupita Raider M.D.   On: 08/01/2020 08:14   DG Chest Port 1 View  Result Date: 07/30/2020 CLINICAL DATA:  Status post cardiac surgery. EXAM: PORTABLE CHEST 1 VIEW COMPARISON:  July 29, 2020. FINDINGS: Stable cardiomegaly. Endotracheal and nasogastric tubes have been removed. Right  internal jugular Swan-Ganz catheter is noted with tip directed toward right pulmonary artery. No pneumothorax is noted. Bilateral chest tubes are noted. Right lung is clear. Mild left basilar atelectasis is noted with small left pleural effusion. Bony thorax is unremarkable. IMPRESSION: Bilateral chest tubes are noted without pneumothorax. Mild left basilar atelectasis is noted with small left pleural effusion. Endotracheal and nasogastric tubes have been removed. Electronically Signed   By: Lupita Raider M.D.   On: 07/30/2020 08:11   DG Chest Port 1 View  Result Date: 07/29/2020 CLINICAL DATA:  Post CABG x4. EXAM: PORTABLE CHEST 1 VIEW COMPARISON:  07/28/2020 FINDINGS: Endotracheal tube has tip approximately 4.2 cm above the carina. Enteric tube courses into the region of the stomach and off the film as tip is not visualized. Right IJ Swan-Ganz catheter has tip over the main pulmonary artery segment. Bibasilar chest tubes are present. Catheter tip overlies the left heart Lungs are adequately inflated with minimal hazy prominence of the perihilar markings suggesting mild degree  of vascular congestion. Mild linear atelectasis over the right midlung. No significant effusion or pneumothorax. Mild cardiomegaly. Remainder of the exam is unchanged. IMPRESSION: 1. Mild cardiomegaly with suggestion of mild vascular congestion. 2. Multiple tubes and lines as described. Electronically Signed   By: Elberta Fortis M.D.   On: 07/29/2020 14:50   DG CHEST PORT 1 VIEW  Result Date: 07/28/2020 CLINICAL DATA:  Preop for heart surgery. EXAM: PORTABLE CHEST 1 VIEW COMPARISON:  Chest x-ray 07/02/2020 FINDINGS: The heart size and mediastinal contours are unchanged. Aortic arch calcification. Well-defined stable 7 mm right lower lung zone pulmonary nodule consistent with known basilar granuloma. No focal consolidation. No pulmonary edema. No pleural effusion. No pneumothorax. No acute osseous abnormality. Multilevel degenerative  changes of the spine. IMPRESSION: No active cardiopulmonary disease. Electronically Signed   By: Tish Frederickson M.D.   On: 07/28/2020 21:06   DG Abd Portable 1V  Result Date: 08/01/2020 CLINICAL DATA:  Ileus.  History of constipation. EXAM: PORTABLE ABDOMEN - 1 VIEW COMPARISON:  07/24/2020. FINDINGS: Mildly distended loops of small and large bowel. Mild adynamic ileus cannot be excluded. No large stool volume. No free air identified. Degenerative change lumbar spine. IMPRESSION: Mildly distended loops of small large bowel. Mild adynamic ileus cannot be excluded. No large stool volume. Electronically Signed   By: Maisie Fus  Register   On: 08/01/2020 08:54   ECHOCARDIOGRAM COMPLETE  Result Date: 07/24/2020    ECHOCARDIOGRAM REPORT   Patient Name:   AADIL SUR Date of Exam: 07/24/2020 Medical Rec #:  [id number]      Height:       73.0 in Accession #:    [id number]     Weight:       250.0 lb Date of Birth:  1950-11-20      BSA:          2.366 m Patient Age:    69 years       BP:           128/87 mmHg Patient Gender: M              HR:           72 bpm. Exam Location:  Inpatient Procedure: 2D Echo Indications:    780.2 syncope  History:        Patient has prior history of Echocardiogram examinations, most                 recent 02/20/2020. Arrythmias:Atrial Fibrillation; Risk                 Factors:Hypertension. LBBB.  Sonographer:    Celene Skeen RDCS (AE) Referring Phys: [id number] ANASTASSIA DOUTOVA  Sonographer Comments: Image acquisition challenging due to patient body habitus and Image acquisition challenging due to respiratory motion. IMPRESSIONS  1. Poor acoustic windows limt study. Overall LVEF is normal ENdocardium is difficult to see, especially at apex. Consider limited echo with Definity to confirm wall motion. . Left ventricular ejection fraction, by estimation, is 55 to 60%. The left ventricle has normal function. Left ventricular endocardial border not optimally defined to evaluate regional wall  motion. There is severe asymmetric left ventricular hypertrophy. Left ventricular diastolic parameters are indeterminate.  2. Right ventricular systolic function is normal. The right ventricular size is mildly enlarged.  3. The mitral valve is abnormal. Mild mitral valve regurgitation.  4. The aortic valve is abnormal. Aortic valve regurgitation is not visualized. Mild aortic valve sclerosis is present, with no evidence of aortic  valve stenosis.  5. The inferior vena cava is normal in size with greater than 50% respiratory variability, suggesting right atrial pressure of 3 mmHg. FINDINGS  Left Ventricle: Poor acoustic windows limt study. Overall LVEF is normal ENdocardium is difficult to see, especially at apex. Consider limited echo with Definity to confirm wall motion. Left ventricular ejection fraction, by estimation, is 55 to 60%. The left ventricle has normal function. Left ventricular endocardial border not optimally defined to evaluate regional wall motion. The left ventricular internal cavity size was normal in size. There is severe asymmetric left ventricular hypertrophy. Left ventricular diastolic parameters are indeterminate. Right Ventricle: The right ventricular size is mildly enlarged. Right vetricular wall thickness was not assessed. Right ventricular systolic function is normal. Left Atrium: Left atrial size was normal in size. Right Atrium: Right atrial size was normal in size. Pericardium: There is no evidence of pericardial effusion. Mitral Valve: The mitral valve is abnormal. There is mild thickening of the mitral valve leaflet(s). Mild to moderate mitral annular calcification. Mild mitral valve regurgitation. Tricuspid Valve: The tricuspid valve is not well visualized. Tricuspid valve regurgitation is mild. Aortic Valve: The aortic valve is abnormal. Aortic valve regurgitation is not visualized. Mild aortic valve sclerosis is present, with no evidence of aortic valve stenosis. Aortic valve mean  gradient measures 8.0 mmHg. Aortic valve peak gradient measures  13.5 mmHg. Aortic valve area, by VTI measures 2.27 cm. Pulmonic Valve: The pulmonic valve was not well visualized. Pulmonic valve regurgitation is not visualized. No evidence of pulmonic stenosis. Aorta: The aortic root is normal in size and structure. Venous: The inferior vena cava is normal in size with greater than 50% respiratory variability, suggesting right atrial pressure of 3 mmHg. IAS/Shunts: The interatrial septum was not assessed.  LEFT VENTRICLE PLAX 2D LVIDd:         4.02 cm LVIDs:         3.08 cm LV PW:         1.33 cm LV IVS:        13.00 cm LVOT diam:     2.20 cm LV SV:         75 LV SV Index:   32 LVOT Area:     3.80 cm  RIGHT VENTRICLE TAPSE (M-mode): 1.3 cm LEFT ATRIUM             Index       RIGHT ATRIUM           Index LA diam:        3.50 cm 1.48 cm/m  RA Area:     18.30 cm LA Vol (A2C):   66.1 ml 27.94 ml/m RA Volume:   43.80 ml  18.51 ml/m LA Vol (A4C):   33.8 ml 14.29 ml/m LA Biplane Vol: 49.3 ml 20.84 ml/m  AORTIC VALVE AV Area (Vmax):    2.15 cm AV Area (Vmean):   2.37 cm AV Area (VTI):     2.27 cm AV Vmax:           184.00 cm/s AV Vmean:          130.000 cm/s AV VTI:            0.332 m AV Peak Grad:      13.5 mmHg AV Mean Grad:      8.0 mmHg LVOT Vmax:         104.00 cm/s LVOT Vmean:        81.200 cm/s LVOT VTI:  0.198 m LVOT/AV VTI ratio: 0.60  AORTA Ao Root diam: 3.10 cm MITRAL VALVE MV Area (PHT): 2.99 cm    SHUNTS MV Decel Time: 254 msec    Systemic VTI:  0.20 m MV E velocity: 91.30 cm/s  Systemic Diam: 2.20 cm Dietrich Pates MD Electronically signed by Dietrich Pates MD Signature Date/Time: 07/24/2020/2:33:10 PM    Final    ECHO INTRAOPERATIVE TEE  Result Date: 07/30/2020  *INTRAOPERATIVE TRANSESOPHAGEAL REPORT *  Patient Name:   JAIYDEN LAUR Date of Exam: 07/29/2020 Medical Rec #:  [id number]      Height:       73.0 in Accession #:    [id number]     Weight:       256.9 lb Date of Birth:  December 14, 1950       BSA:          2.39 m Patient Age:    69 years       BP:           123/79 mmHg Patient Gender: M              HR:           62 bpm. Exam Location:  Anesthesiology Transesophogeal exam was perform intraoperatively during surgical procedure. Patient was closely monitored under general anesthesia during the entirety of examination. Indications:     CAD Native Vessel Sonographer:     Thurman Coyer RDCS (AE) Performing Phys: [id number] Ephriam Knuckles Z ATKINS Diagnosing Phys: Achille Rich MD Complications: No known complications during this procedure. POST-OP IMPRESSIONS - Left Ventricle: The left ventricle is unchanged from pre-bypass. - Right Ventricle: The right ventricle appears unchanged from pre-bypass. - Aorta: The aorta appears unchanged from pre-bypass. - Left Atrium: The left atrium appears unchanged from pre-bypass. - Left Atrial Appendage: Has been surgically closed and the repair appears to be oversewn. - Aortic Valve: The aortic valve appears unchanged from pre-bypass. - Mitral Valve: The mitral valve appears unchanged from pre-bypass. - Tricuspid Valve: The tricuspid valve appears unchanged from pre-bypass. - Interatrial Septum: The interatrial septum appears unchanged from pre-bypass. - Interventricular Septum: The interventricular septum appears unchanged from pre-bypass. - Pericardium: The pericardium appears unchanged from pre-bypass. PRE-OP FINDINGS  Left Ventricle: The left ventricle has mildly reduced systolic function, with an ejection fraction of 45-50%. The cavity size was normal. There is mildly increased left ventricular wall thickness. No evidence of left ventricular regional wall motion abnormalities. Right Ventricle: The right ventricle has normal systolic function. The cavity was normal. There is no increase in right ventricular wall thickness. Left Atrium: Left atrial size was normal in size. The left atrial appendage is well visualized and there is no evidence of thrombus present. Right  Atrium: Right atrial size was normal in size. Interatrial Septum: No atrial level shunt detected by color flow Doppler. Pericardium: There is no evidence of pericardial effusion. Mitral Valve: The mitral valve is normal in structure. Mitral valve regurgitation is mild by color flow Doppler. There is No evidence of mitral stenosis. Tricuspid Valve: The tricuspid valve was normal in structure. Tricuspid valve regurgitation is trivial by color flow Doppler. Aortic Valve: The aortic valve is tricuspid There is mild thickening of the aortic valve Aortic valve regurgitation was not visualized by color flow Doppler. There is no evidence of aortic valve stenosis. Right coronary cusp excursion is reduced. Pulmonic Valve: The pulmonic valve was normal in structure. Pulmonic valve regurgitation is not visualized by color flow Doppler. Aorta: The aortic root,  ascending aorta and aortic arch are normal in size and structure.  Achille Rich MD Electronically signed by Achille Rich MD Signature Date/Time: 07/30/2020/6:58:54 PM    Final    VAS US DOPPLER PRE CABG  Result Date: 07/28/2020 PREOPERATIVE VASCULAR EVALUATION  Indications:      Pre-CABG. Risk Factors:     Hypertension. Comparison Study: No prior studies. Performing Technologist: Olen Cordial RVT  Examination Guidelines: A complete evaluation includes B-mode imaging, spectral Doppler, color Doppler, and power Doppler as needed of all accessible portions of each vessel. Bilateral testing is considered an integral part of a complete examination. Limited examinations for reoccurring indications may be performed as noted.  Right Carotid Findings: +----------+--------+--------+--------+-----------------------+--------+           PSV cm/sEDV cm/sStenosisDescribe               Comments +----------+--------+--------+--------+-----------------------+--------+ CCA Prox  143     15              smooth and heterogenous          +----------+--------+--------+--------+-----------------------+--------+ CCA Distal66      10              smooth and heterogenous         +----------+--------+--------+--------+-----------------------+--------+ ICA Prox  50      19              calcific                        +----------+--------+--------+--------+-----------------------+--------+ ICA Distal96      31                                     tortuous +----------+--------+--------+--------+-----------------------+--------+ ECA       128     15                                              +----------+--------+--------+--------+-----------------------+--------+ Portions of this table do not appear on this page. +----------+--------+-------+--------+------------+           PSV cm/sEDV cmsDescribeArm Pressure +----------+--------+-------+--------+------------+ Subclavian101                                 +----------+--------+-------+--------+------------+ +---------+--------+---+--------+--+---------+ VertebralPSV cm/s156EDV cm/s34Antegrade +---------+--------+---+--------+--+---------+ Left Carotid Findings: +----------+--------+--------+--------+-----------------------+--------+           PSV cm/sEDV cm/sStenosisDescribe               Comments +----------+--------+--------+--------+-----------------------+--------+ CCA Prox  79      18              smooth and heterogenous         +----------+--------+--------+--------+-----------------------+--------+ CCA Distal53      10              smooth and heterogenous         +----------+--------+--------+--------+-----------------------+--------+ ICA Prox  237     77      60-79%  calcific                        +----------+--------+--------+--------+-----------------------+--------+ ICA Mid   199     37  smooth and heterogenous         +----------+--------+--------+--------+-----------------------+--------+ ICA Distal97       17                                     tortuous +----------+--------+--------+--------+-----------------------+--------+ ECA       99      11                                              +----------+--------+--------+--------+-----------------------+--------+ +----------+--------+--------+--------+------------+ SubclavianPSV cm/sEDV cm/sDescribeArm Pressure +----------+--------+--------+--------+------------+           128                                  +----------+--------+--------+--------+------------+ +---------+--------+--+--------+--+---------+ VertebralPSV cm/s53EDV cm/s24Antegrade +---------+--------+--+--------+--+---------+  ABI Findings: +--------+------------------+-----+---------+--------+ Right   Rt Pressure (mmHg)IndexWaveform Comment  +--------+------------------+-----+---------+--------+ [id number]                    triphasic         +--------+------------------+-----+---------+--------+ PTA     172               1.10 biphasic          +--------+------------------+-----+---------+--------+ DP      145               0.93 biphasic          +--------+------------------+-----+---------+--------+ +--------+------------------+-----+---------+-------+ Left    Lt Pressure (mmHg)IndexWaveform Comment +--------+------------------+-----+---------+-------+ [id number]                    triphasic        +--------+------------------+-----+---------+-------+ PTA     250               1.60 triphasic        +--------+------------------+-----+---------+-------+ DP      161               1.03 biphasic         +--------+------------------+-----+---------+-------+ +-------+---------------+----------------+ ABI/TBIToday's ABI/TBIPrevious ABI/TBI +-------+---------------+----------------+ Right  1.1                             +-------+---------------+----------------+ Left   1.6                              +-------+---------------+----------------+  Right Doppler Findings: +--------+--------+-----+---------+--------+ Site    PressureIndexDoppler  Comments +--------+--------+-----+---------+--------+ [id number]          triphasic         +--------+--------+-----+---------+--------+ Radial               triphasic         +--------+--------+-----+---------+--------+ Ulnar                triphasic         +--------+--------+-----+---------+--------+  Left Doppler Findings: +--------+--------+-----+---------+--------+ Site    PressureIndexDoppler  Comments +--------+--------+-----+---------+--------+ Brachial150          triphasic         +--------+--------+-----+---------+--------+ Radial               triphasic         +--------+--------+-----+---------+--------+ Ulnar  triphasic         +--------+--------+-----+---------+--------+  Summary: Right Carotid: Velocities in the right ICA are consistent with a 1-39% stenosis. Left Carotid: Velocities in the left ICA are consistent with a 60-79% stenosis. Vertebrals: Bilateral vertebral arteries demonstrate antegrade flow. Right ABI: Resting right ankle-brachial index is within normal range. No evidence of significant right lower extremity arterial disease. Left ABI: Resting left ankle-brachial index indicates noncompressible left lower extremity arteries. Right Upper Extremity: Doppler waveform obliterate with right radial compression. Doppler waveform obliterate with right ulnar compression. Left Upper Extremity: Doppler waveform obliterate with left radial compression. Doppler waveform obliterate with left ulnar compression.  Electronically signed by Fabienne Bruns MD on 07/28/2020 at 5:18:01 PM.    Final    Discharge Instructions    Amb Referral to Cardiac Rehabilitation   Complete by: As directed    Diagnosis: CABG   CABG X ___: 4 Comment - MAZE   After initial evaluation and assessments completed:  Virtual Based Care may be provided alone or in conjunction with Phase 2 Cardiac Rehab based on patient barriers.: Yes      Discharge Medications: Allergies as of 08/05/2020   No Known Allergies     Medication List    STOP taking these medications   amLODipine 10 MG tablet Commonly known as: NORVASC   olmesartan 40 MG tablet Commonly known as: BENICAR     TAKE these medications   acetaminophen 500 MG tablet Commonly known as: TYLENOL Take 2 tablets (1,000 mg total) by mouth every 6 (six) hours as needed.   amiodarone 200 MG tablet Commonly known as: PACERONE Take 1 tablet (200 mg total) by mouth daily.   aspirin 81 MG EC tablet Take 1 tablet (81 mg total) by mouth daily. Swallow whole.   guaiFENesin 600 MG 12 hr tablet Commonly known as: MUCINEX Take 1 tablet (600 mg total) by mouth 2 (two) times daily as needed.   isosorbide dinitrate 10 MG tablet Commonly known as: ISORDIL Take 1 tablet (10 mg total) by mouth 3 (three) times daily. Take for 3 weeks then stop.   metoprolol tartrate 25 MG tablet Commonly known as: LOPRESSOR Take 0.5 tablets (12.5 mg total) by mouth 2 (two) times daily. What changed: how much to take   oxyCODONE 5 MG immediate release tablet Commonly known as: Oxy IR/ROXICODONE Take 1 tablet (5 mg total) by mouth every 4 (four) hours as needed for severe pain.   pantoprazole 40 MG tablet Commonly known as: PROTONIX Take 2 tablets (80 mg total) by mouth daily. For one week then take daily Start taking on: August 06, 2020   potassium chloride SA 20 MEQ tablet Commonly known as: KLOR-CON Take 1 tablet (20 mEq total) by mouth daily. Start taking on: August 06, 2020   rosuvastatin 40 MG tablet Commonly known as: CRESTOR Take 1 tablet (40 mg total) by mouth daily. What changed:   medication strength  how much to take   Xarelto 20 MG Tabs tablet Generic drug: rivaroxaban TAKE 1 TABLET BY MOUTH IN  THE EVENING AFTER DINNER What  changed: See the new instructions.      The patient has been discharged on:   1.Beta Blocker:  Yes [ x  ]                              No   [   ]  If No, reason:  2.Ace Inhibitor/ARB: Yes [   ]                                     No  [ x   ]                                     If No, reason: Mildly elevated creatinine  3.Statin:   Yes [ x  ]                  No  [   ]                  If No, reason:  4.Ecasa:  Yes  [  x ]                  No   [   ]                  If No, reason:  Follow Up Appointments:  Contact information for follow-up providers    Linden Dolin, MD Follow up on 08/11/2020.   Specialty: Cardiothoracic Surgery Why: Appointment is at 3:30 Contact information: [address] [place] [address] [phone]        Tessa Lerner, DO Follow up on 08/11/2020.   Specialties: Cardiology, Radiology, Vascular Surgery Why: Appointment is at 11:45 Contact information: [address] [place] [address] [phone]        Danice Goltz, [place]. Go on 08/22/2020.   Specialty: Cardiology Why: Appointment time is at 11:30 am Contact information: [address] [place] [address] [phone]            Contact information for after-discharge care    Destination    [facility] Preferred SNF .   Service: Skilled Nursing Contact information: [address] [place] [address] [phone]                  Signed: Lelon Huh [facility] 08/05/2020, 1:31 PM

## 2020-08-05 NOTE — Progress Notes (Signed)
Physical Therapy Treatment Patient Details Name: [name] Rice MRN: [id number] DOB: January 09, 1951 Today's Date: 08/05/2020    History of Present Illness Pt is a 69 yo male admitted with light headedness and found to have CAD. Underwent CABG x4 on 9/21. PMH - a.fib, bradycardia, HTN, obesity, LBBB, chronic systolic CHF    PT Comments    Pt required min assist transfers and min guard assist ambulation 200' with RW. Cues needed for sternal precautions during transfers. SpO2 > 90% on RA. 2/4 DOE after amb. Pt in recliner at end of session.    Follow Up Recommendations  SNF     Equipment Recommendations  Rolling walker with 5" wheels;Other (comment) (vs rollator)    Recommendations for Other Services       Precautions / Restrictions Precautions Precautions: Fall;Sternal Restrictions Weight Bearing Restrictions: No    Mobility  Bed Mobility               General bed mobility comments: Pt received in bathroom and seated in recliner at end of session.  Transfers Overall transfer level: Needs assistance Equipment used: Rolling walker (2 wheeled) Transfers: Sit to/from Stand Sit to Stand: Min assist         General transfer comment: cues for sternal precautions, assist to power up  Ambulation/Gait Ambulation/Gait assistance: Min guard Gait Distance (Feet): 200 Feet Assistive device: Rolling walker (2 wheeled) Gait Pattern/deviations: Step-through pattern;Decreased stride length Gait velocity: decreased Gait velocity interpretation: <1.31 ft/sec, indicative of household ambulator General Gait Details: assist for balance. Cues for sequencing/safety with turns.   Stairs             Wheelchair Mobility    Modified Rankin (Stroke Patients Only)       Balance Overall balance assessment: Needs assistance Sitting-balance support: Feet supported;No upper extremity supported Sitting balance-Leahy Scale: Good     Standing balance support: Bilateral upper  extremity supported;During functional activity Standing balance-Leahy Scale: Poor Standing balance comment: reliant on BUE support                            Cognition Arousal/Alertness: Awake/alert Behavior During Therapy: WFL for tasks assessed/performed Overall Cognitive Status: Within Functional Limits for tasks assessed                                        Exercises      General Comments General comments (skin integrity, edema, etc.): SpO2 > 90% on RA. 2/4 DOE      Pertinent Vitals/Pain Pain Assessment: No/denies pain    Home Living                      Prior Function            PT Goals (current goals can now be found in the care plan section) Acute Rehab PT Goals Patient Stated Goal: to be safe and independent Progress towards PT goals: Progressing toward goals    Frequency    Min 2X/week      PT Plan Current plan remains appropriate    Co-evaluation              AM-PAC PT "6 Clicks" Mobility   Outcome Measure  Help needed turning from your back to your side while in a flat bed without using bedrails?: A Little Help needed moving from lying  on your back to sitting on the side of a flat bed without using bedrails?: A Little Help needed moving to and from a bed to a chair (including a wheelchair)?: A Little Help needed standing up from a chair using your arms (e.g., wheelchair or bedside chair)?: A Little Help needed to walk in hospital room?: A Little Help needed climbing 3-5 steps with a railing? : A Little 6 Click Score: 18    End of Session Equipment Utilized During Treatment: Gait belt Activity Tolerance: Patient tolerated treatment well Patient left: in chair;with call bell/phone within reach Nurse Communication: Mobility status PT Visit Diagnosis: Unsteadiness on feet (R26.81);Muscle weakness (generalized) (M62.81)     Time: 5681-2751 PT Time Calculation (min) (ACUTE ONLY): 24 min  Charges:   $Gait Training: 23-37 mins                     Aida Raider, [place]  Office # [phone] Pager [phone]    Ilda Foil 08/05/2020, 11:49 AM

## 2020-08-05 NOTE — Progress Notes (Addendum)
      [address]       [place] [address]             [phone]        7 Days Post-Op Procedure(s) (LRB): CORONARY ARTERY BYPASS GRAFTING (CABG) TIMES FOUR USING BILATERAL INTERNAL MAMMARIES AND LEFT RADIAL ARTERY (N/A) MAZE (N/A) CLIPPING OF ATRIAL APPENDAGE (Left) RADIAL ARTERY HARVEST (Left) TRANSESOPHAGEAL ECHOCARDIOGRAM (TEE) (N/A) INDOCYANINE GREEN FLUORESCENCE IMAGING (ICG) (N/A)  Subjective: Patient no longer with loose stools. He is asking for help to sit up and eat breakfast this am. He has no specific complaint this am.  Objective: Vital signs in last 24 hours: Temp:  [95 F (35 C)-98 F (36.7 C)] 98 F (36.7 C) (09/28 0421) Pulse Rate:  [85-100] 85 (09/28 0421) Cardiac Rhythm: Heart block (09/27 1900) Resp:  [19-24] 20 (09/28 0421) BP: (115-148)/(68-94) 148/90 (09/28 0421) SpO2:  [93 %-95 %] 94 % (09/28 0421) Weight:  [117.9 kg] 117.9 kg (09/28 0526)  Pre op weight 116.5  kg Current Weight  08/05/20 117.9 kg       Intake/Output from previous day: 09/27 0701 - 09/28 0700 In: 240 [P.O.:240] Out: 1240 [Urine:1240]   Physical Exam:  Cardiovascular: RRR Pulmonary: Slightly diminished bibasilar breath sounds Abdomen: Soft, non tender, bowel sounds present. Extremities: Mild bilateral lower extremity edema. Motor/sensory intact LUE Wounds: Sternal and LUE wounds are clean and dry.  No erythema or signs of infection.  Lab Results: CBC: Recent Labs    08/03/20 0104  WBC 12.4*  HGB 9.0*  HCT 28.7*  PLT 330   BMET:  Recent Labs    08/03/20 0104 08/05/20 0132  NA 134* 133*  K 3.4* 3.7  CL 98 96*  CO2 25 28  GLUCOSE 105* 107*  BUN 23 15  CREATININE 1.31* 1.24  CALCIUM 8.3* 8.1*    PT/INR:  Lab Results  Component Value Date   INR 1.7 (H) 07/29/2020   INR 1.2 07/29/2020   ABG:  INR: Will add last result for INR, ABG once components are confirmed Will add last 4 CBG results once components are  confirmed  Assessment/Plan:  1. CV - Has a history of chronic a fib. PAF. SR, first degree heart block this am. On Amiodarone 200 mg bid, Lopressor 12.5 mg bid, Isordil 10 mg tid, and Rivaroxaban 20 mg daily.  2.  Pulmonary - On room air. Encourage incentive spirometer 3. Volume Overload - Will give IV Lasix again this am 4.  Expected post op acute blood loss anemia - H and H yesterday 9 and 28.7. 5. Creatinine this am decreased to 1.24. Creatinine upon admission 1.2 6. Supplement potassium 7. Deconditioned-PT/OT.  SNF when ready for discharge  [name] M ZimmermanPA-C 08/05/2020,6:59 AM  Pt seen and examined; agree with documentation. Ready for discharge. [name] Rice Z. Vickey Sages, MD [phone]

## 2020-08-05 NOTE — Progress Notes (Signed)
Called [facility] and verified the time for pick up and was told around  8 pm. Pt. Made aware.

## 2020-08-05 NOTE — Progress Notes (Signed)
1330-1400 Pt to recliner. Education completed with pt who voiced understanding. Reviewed staying in the tube and sternal precautions, encouraged IS and flutter valve, gave walking instructions for reference, discussed heart healthy food choices, and wound care. Referring to [place] CRP 2.  Encouraged pt to have his brother read materials also. Luetta Nutting RN BSN 08/05/2020 1:59 PM

## 2020-08-06 ENCOUNTER — Non-Acute Institutional Stay (SKILLED_NURSING_FACILITY): Payer: Medicare Other | Admitting: Adult Health

## 2020-08-06 ENCOUNTER — Encounter: Payer: Self-pay | Admitting: Adult Health

## 2020-08-06 DIAGNOSIS — E876 Hypokalemia: Secondary | ICD-10-CM

## 2020-08-06 DIAGNOSIS — I4821 Permanent atrial fibrillation: Secondary | ICD-10-CM

## 2020-08-06 DIAGNOSIS — D62 Acute posthemorrhagic anemia: Secondary | ICD-10-CM

## 2020-08-06 DIAGNOSIS — I2510 Atherosclerotic heart disease of native coronary artery without angina pectoris: Secondary | ICD-10-CM | POA: Diagnosis not present

## 2020-08-06 DIAGNOSIS — R5381 Other malaise: Secondary | ICD-10-CM

## 2020-08-06 NOTE — Progress Notes (Signed)
Location:  [facility] Room Number: 112 A Place of Service:  SNF (31) Provider:  Kenard Gower, DNP, FNP-BC  Patient Care Team: Gala Lewandowsky as PCP - General (Family Medicine)  Extended Emergency Contact Information Primary Emergency Contact: Teaghan, Formica Mobile Phone: [phone] Relation: Brother Secondary Emergency Contact: Rod, Majerus Mobile Phone: [phone] Relation: Aunt  Code Status:   Full Code   Goals of care: Advanced Directive information Advanced Directives 07/28/2020  Does Patient Have a Medical Advance Directive? No  Would patient like information on creating a medical advance directive? No - Patient declined     Chief Complaint  Patient presents with  . Hospitalization Follow-up    Admitted to [facility] for short-term rehabilitation    HPI:  Pt is a 69 y.o. male who was admitted to [facility] on 08/05/20 post [facility] hospitalization 07/23/20 to 08/05/20.  He has a PMH of atrial fibrillation S/P attempted cardioversion. Three weeks prior to hospitalization, he was admitted to the hospital due to dizziness.  He had a Holter monitor placed and was discharged home.  He had another episode of not feeling well. He was admitted to the hospital and had echocardiogram which showed reduced ejection fraction and a nuclear stress test which suggested perfusion defect.  Left heart catheterization showed multivessel coronary artery disease.  Cardiothoracic surgery was consulted for CABG and atrial ablation.  He underwent CABG x4 on 07/29/2020.  He was started on Isordil for his radial artery graft.  Follow-up chest x-ray showed a small left pleural effusion/atelectasis but no evidence of pneumothorax.  He was a started on oral amiodarone for his history of atrial fibrillation and post maze procedure.  His home Xarelto was then restarted.  Aggressive diuretic therapy with Lasix IV BID was done.  As a result, he developed  an elevation in his creatinine level with a peak of 1.31 and his regimen was adjusted.  Hospitalization was complicated by nausea and vomiting.  Abdominal film was obtained and showed mild gaseous dilatation of his bowels.  He was treated with Reglan and his diet was advanced very slowly.  His symptoms resolved and his bowels moved without difficulty.  Ileus resolved on 9/26 with several bowel movements.  PT was consulted and recommended SNF/short-term rehabilitation.  He was seen in his room today.He stated that he does not have pain unless he moves.Surgical sites on his midline chest and left forearm with staples intact and no erythema.  Past Medical History:  Diagnosis Date  . A-fib (HCC)   . Atrial fibrillation (HCC)   . Dysrhythmia   . Hypertension   . LBBB (left bundle branch block)    Past Surgical History:  Procedure Laterality Date  . CARDIOVERSION N/A 09/28/2016   Procedure: CARDIOVERSION;  Surgeon: Yates Decamp, MD;  Location: [facility] ENDOSCOPY;  Service: Cardiovascular;  Laterality: N/A;  . CLIPPING OF ATRIAL APPENDAGE Left 07/29/2020   Procedure: CLIPPING OF ATRIAL APPENDAGE;  Surgeon: Linden Dolin, MD;  Location: [facility] OR;  Service: Open Heart Surgery;  Laterality: Left;  . CORONARY ARTERY BYPASS GRAFT N/A 07/29/2020   Procedure: CORONARY ARTERY BYPASS GRAFTING (CABG) TIMES FOUR USING BILATERAL INTERNAL MAMMARIES AND LEFT RADIAL ARTERY;  Surgeon: Linden Dolin, MD;  Location: [facility] OR;  Service: Open Heart Surgery;  Laterality: N/A;  . LEFT HEART CATH AND CORONARY ANGIOGRAPHY N/A 07/25/2020   Procedure: LEFT HEART CATH AND CORONARY ANGIOGRAPHY;  Surgeon: Yates Decamp, MD;  Location: [facility] INVASIVE CV LAB;  Service: Cardiovascular;  Laterality: N/A;  . MAZE N/A 07/29/2020   Procedure: MAZE;  Surgeon: Linden Dolin, MD;  Location: [facility] OR;  Service: Open Heart Surgery;  Laterality: N/A;  . NO PAST SURGERIES    . RADIAL ARTERY HARVEST Left 07/29/2020   Procedure: RADIAL ARTERY HARVEST;   Surgeon: Linden Dolin, MD;  Location: [facility] OR;  Service: Open Heart Surgery;  Laterality: Left;  . TEE WITHOUT CARDIOVERSION N/A 07/29/2020   Procedure: TRANSESOPHAGEAL ECHOCARDIOGRAM (TEE);  Surgeon: Linden Dolin, MD;  Location: [facility] OR;  Service: Open Heart Surgery;  Laterality: N/A;    No Known Allergies  Outpatient Encounter Medications as of 08/06/2020  Medication Sig  . acetaminophen (TYLENOL) 500 MG tablet Take 2 tablets (1,000 mg total) by mouth every 6 (six) hours as needed.  Marland Kitchen amiodarone (PACERONE) 200 MG tablet Take 1 tablet (200 mg total) by mouth daily.  Marland Kitchen aspirin EC 81 MG EC tablet Take 1 tablet (81 mg total) by mouth daily. Swallow whole.  Marland Kitchen guaiFENesin (MUCINEX) 600 MG 12 hr tablet Take 1 tablet (600 mg total) by mouth 2 (two) times daily as needed.  . isosorbide dinitrate (ISORDIL) 10 MG tablet Take 1 tablet (10 mg total) by mouth 3 (three) times daily. Take for 3 weeks then stop.  . metoprolol tartrate (LOPRESSOR) 25 MG tablet Take 0.5 tablets (12.5 mg total) by mouth 2 (two) times daily.  Marland Kitchen oxyCODONE (OXY IR/ROXICODONE) 5 MG immediate release tablet Take 1 tablet (5 mg total) by mouth every 4 (four) hours as needed for severe pain.  . pantoprazole (PROTONIX) 40 MG tablet Take 2 tablets (80 mg total) by mouth daily. For one week then take daily  . potassium chloride SA (KLOR-CON) 20 MEQ tablet Take 1 tablet (20 mEq total) by mouth daily.  . rosuvastatin (CRESTOR) 40 MG tablet Take 1 tablet (40 mg total) by mouth daily.  Carlena Hurl 20 MG TABS tablet TAKE 1 TABLET BY MOUTH IN  THE EVENING AFTER DINNER (Patient taking differently: Take 20 mg by mouth daily after supper. )   No facility-administered encounter medications on file as of 08/06/2020.    Review of Systems  GENERAL: No change in appetite, no fatigue, no weight changes, no fever, chills or weakness MOUTH and THROAT: Denies oral discomfort, gingival pain or bleeding RESPIRATORY: no cough, SOB, DOE, wheezing,  hemoptysis CARDIAC: No chest pain, edema or palpitations GI: No abdominal pain, diarrhea, constipation, heart burn, nausea or vomiting GU: Denies dysuria, frequency, hematuria, incontinence, or discharge NEUROLOGICAL: Denies dizziness, syncope, numbness, or headache PSYCHIATRIC: Denies feelings of depression or anxiety. No report of hallucinations, insomnia, paranoia, or agitation   Immunization History  Administered Date(s) Administered  . PFIZER SARS-COV-2 Vaccination 12/13/2019, 01/04/2020  . Tdap 02/02/2015   Pertinent  Health Maintenance Due  Topic Date Due  . URINE MICROALBUMIN  Never done  . COLONOSCOPY  Never done  . PNA vac Low Risk Adult (1 of 2 - PCV13) Never done  . INFLUENZA VACCINE  06/08/2020   No flowsheet data found.   Vitals:   08/06/20 1000  BP: (!) 146/81  Pulse: 94  Resp: 18  Temp: (!) 97.3 F (36.3 C)  Weight: 264 lb (119.7 kg)  Height: 6\' 1"  (1.854 m)   Body mass index is 34.83 kg/m.  Physical Exam  GENERAL APPEARANCE: Well nourished. In no acute distress. Obese SKIN:  Anterior chest midline  and left forearm surgical wound with staples intact, no erythema MOUTH and THROAT:  Lips are without lesions. Oral mucosa is moist and without lesions. Tongue is normal in shape, size, and color and without lesions RESPIRATORY: Breathing is even & unlabored, BS CTAB CARDIAC: RRR, no murmur,no extra heart sounds, no edema GI: Abdomen soft, normal BS, no masses, no tenderness EXTREMITIES:  Able to move X 4 extremities NEUROLOGICAL: There is no tremor. Speech is clear. Alert and oriented X 3 PSYCHIATRIC: . Affect and behavior are appropriate  Labs reviewed: Recent Labs    07/24/20 0210 07/24/20 0748 07/24/20 1122 07/25/20 0422 07/29/20 2003 07/29/20 2033 07/30/20 0403 07/30/20 0403 07/30/20 1707 07/31/20 0440 08/02/20 0432 08/03/20 0104 08/05/20 0132  NA 137   < >  --    < > 138   < > 138   < > 135   < > 131* 134* 133*  K 3.5   < >  --    < >  4.2   < > 4.4   < > 4.4   < > 3.8 3.4* 3.7  CL 108   < >  --    < > 108   < > 107   < > 105   < > 99 98 96*  CO2 18*   < >  --    < > 19*   < > 22   < > 19*   < > 26 25 28   GLUCOSE 104*   < >  --    < > 165*   < > 143*   < > 174*   < > 97 105* 107*  BUN 17   < >  --    < > 6*   < > 7*   < > 8   < > 25* 23 15  CREATININE 1.74*   < >  --    < > 0.86   < > 1.07   < > 1.17   < > 1.29* 1.31* 1.24  CALCIUM 8.3*   < >  --    < > 8.2*   < > 8.4*   < > 8.3*   < > 8.1* 8.3* 8.1*  MG 1.6*  --  1.7   < > 2.9*  --  2.6*  --  2.2  --   --   --   --   PHOS 2.8  --  2.8  --   --   --   --   --   --   --   --   --   --    < > = values in this interval not displayed.   Recent Labs    07/24/20 0748 07/26/20 0515 07/27/20 0201  AST 34 22 21  ALT 32 21 21  ALKPHOS 163* 120 105  BILITOT 1.9* 1.7* 1.4*  PROT 6.0* 6.0* 5.7*  ALBUMIN 2.7* 2.5* 2.4*   Recent Labs    07/02/20 1604 07/23/20 1221 07/24/20 1122 07/25/20 0422 08/01/20 0310 08/02/20 0432 08/03/20 0104  WBC 10.8*   < > 8.2   < > 17.5* 14.0* 12.4*  NEUTROABS 8.4*  --  5.5  --   --   --   --   HGB 12.7*   < > 12.8*   < > 8.6* 8.4* 9.0*  HCT 41.9   < > 42.7   < > 27.5* 27.6* 28.7*  MCV 94.2   < > 94.5   < > 91.1 91.7 90.8  PLT 331   < > 295   < >  215 268 330   < > = values in this interval not displayed.   Lab Results  Component Value Date   TSH 0.941 07/24/2020   Lab Results  Component Value Date   HGBA1C 5.2 07/26/2020   Lab Results  Component Value Date   CHOL 78 07/26/2020   HDL 24 (L) 07/26/2020   LDLCALC 36 07/26/2020   TRIG 92 07/26/2020   CHOLHDL 3.3 07/26/2020    Significant Diagnostic Results in last 30 days:  DG Chest 1 View  Result Date: 07/31/2020 CLINICAL DATA:  Chest tube placement, open heart surgery EXAM: CHEST  1 VIEW COMPARISON:  None. FINDINGS: Right internal jugular Swan-Ganz catheter with its tip within the distal main pulmonary artery, bilateral chest tubes, mediastinal drain, are unchanged. Tiny right  apical pneumothorax is present. Tiny left pleural effusion is noted, decreased since prior examination. Retrocardiac opacification persists likely representing partial left lower lobe collapse. Mild cardiomegaly is unchanged. Median sternotomy has been performed. Probable left atrial clipping has been performed. Pulmonary vascularity is normal. IMPRESSION: Right chest tube in place. Tiny right apical pneumothorax now visualized, possibly related to patient positioning. Left chest tube in place.  Tiny, decreasing left pleural effusion. Persistent partial left lower lobe collapse. Electronically Signed   By: Helyn Numbers MD   On: 07/31/2020 06:47   DG Chest 2 View  Result Date: 08/05/2020 CLINICAL DATA:  Pleural effusion EXAM: CHEST - 2 VIEW COMPARISON:  August 01, 2020 FINDINGS: There is a stable small left pleural effusion with atelectatic change in the left base. There are scattered small granulomas, stable. Heart is enlarged, stable, with evidence of previous coronary artery bypass grafting. Left atrial appendage clamp present. No adenopathy. No pneumothorax. There is degenerative change in the thoracic spine. IMPRESSION: Stable small left pleural effusion with left base atelectasis. No new opacity. There are scattered granulomas. Stable cardiac prominence with postoperative changes. No adenopathy evident. Electronically Signed   By: Bretta Bang III M.D.   On: 08/05/2020 08:15   DG Abd 1 View  Result Date: 07/24/2020 CLINICAL DATA:  Diarrhea for 5 days EXAM: ABDOMEN - 1 VIEW COMPARISON:  Nine hundred eighteen FINDINGS: 2 supine frontal views of the abdomen and pelvis are obtained. Bowel gas pattern is unremarkable without obstruction or ileus. No masses or abnormal calcifications. The lung bases are clear. IMPRESSION: 1. Unremarkable bowel gas pattern. Electronically Signed   By: Sharlet Salina M.D.   On: 07/24/2020 01:25   CARDIAC CATHETERIZATION  Result Date: 07/25/2020  LV end diastolic  pressure is normal.  Ramus lesion is 95% stenosed.  Ost RCA to Prox RCA lesion is 80% stenosed.  Ost Cx to Prox Cx lesion is 95% stenosed.  Mid LM to Dist LM lesion is 40% stenosed.  Mid LAD-1 lesion is 80% stenosed.  Mid LAD-2 lesion is 90% stenosed.  1st Diag lesion is 80% stenosed.  Left Heart Catheterization 07/25/20: RCA: Proximal RCA 80% stenosis, large vessel with mild disease in PL and PDA branches.  A secondary PL branch is subtotally occluded. LM: Distal 30-40% stenosis. LAD: LAD diffusely diseased, proximal diffuse 80% followed by tandem 90% stenosis.  Large D1 with moderate diffuse disease and proximal and mid tandem 70 and 80% stenosis.  Has secondary branches and is tortuous. Cx  and RI: Co-dominant Cx with Ostial circumflex 99% involving a moderate sized ramus with ostial 80 to 90% and proximal 90% stenosis. LV: Normal LVEDP.  No pressure gradient across the aortic valve. Patent LIMA and  RIMA and RIMA has ostial 20-30% stenosis. Subclavian arteries widely patent. Right radial diffusely disease by [place]. 92mL contrast used. Recommendation: Patient needs evaluation for inpatient CABG in view of NSVT, ventricular standstill.  Radial arterial conduits cannot be utilized as there was significant amount of atherosclerosis evident with ultrasound guidance.  Will discuss with surgery.  We could still consider Maze procedure and left atrial appendage ligation.   DG Chest Port 1 View  Result Date: 08/01/2020 CLINICAL DATA:  Shortness of breath. EXAM: PORTABLE CHEST 1 VIEW COMPARISON:  July 31, 2020. FINDINGS: Stable cardiomegaly. No pneumothorax is noted. Right lung is clear. Mild left basilar subsegmental atelectasis is noted with small left pleural effusion. Left-sided chest tube has been removed. Bony thorax is unremarkable. IMPRESSION: Mild left basilar subsegmental atelectasis with small left pleural effusion. No pneumothorax status post left-sided chest tube removal. Electronically Signed    By: Lupita Raider M.D.   On: 08/01/2020 08:14   DG Chest Port 1 View  Result Date: 07/30/2020 CLINICAL DATA:  Status post cardiac surgery. EXAM: PORTABLE CHEST 1 VIEW COMPARISON:  July 29, 2020. FINDINGS: Stable cardiomegaly. Endotracheal and nasogastric tubes have been removed. Right internal jugular Swan-Ganz catheter is noted with tip directed toward right pulmonary artery. No pneumothorax is noted. Bilateral chest tubes are noted. Right lung is clear. Mild left basilar atelectasis is noted with small left pleural effusion. Bony thorax is unremarkable. IMPRESSION: Bilateral chest tubes are noted without pneumothorax. Mild left basilar atelectasis is noted with small left pleural effusion. Endotracheal and nasogastric tubes have been removed. Electronically Signed   By: Lupita Raider M.D.   On: 07/30/2020 08:11   DG Chest Port 1 View  Result Date: 07/29/2020 CLINICAL DATA:  Post CABG x4. EXAM: PORTABLE CHEST 1 VIEW COMPARISON:  07/28/2020 FINDINGS: Endotracheal tube has tip approximately 4.2 cm above the carina. Enteric tube courses into the region of the stomach and off the film as tip is not visualized. Right IJ Swan-Ganz catheter has tip over the main pulmonary artery segment. Bibasilar chest tubes are present. Catheter tip overlies the left heart Lungs are adequately inflated with minimal hazy prominence of the perihilar markings suggesting mild degree of vascular congestion. Mild linear atelectasis over the right midlung. No significant effusion or pneumothorax. Mild cardiomegaly. Remainder of the exam is unchanged. IMPRESSION: 1. Mild cardiomegaly with suggestion of mild vascular congestion. 2. Multiple tubes and lines as described. Electronically Signed   By: Elberta Fortis M.D.   On: 07/29/2020 14:50   DG CHEST PORT 1 VIEW  Result Date: 07/28/2020 CLINICAL DATA:  Preop for heart surgery. EXAM: PORTABLE CHEST 1 VIEW COMPARISON:  Chest x-ray 07/02/2020 FINDINGS: The heart size and  mediastinal contours are unchanged. Aortic arch calcification. Well-defined stable 7 mm right lower lung zone pulmonary nodule consistent with known basilar granuloma. No focal consolidation. No pulmonary edema. No pleural effusion. No pneumothorax. No acute osseous abnormality. Multilevel degenerative changes of the spine. IMPRESSION: No active cardiopulmonary disease. Electronically Signed   By: Tish Frederickson M.D.   On: 07/28/2020 21:06   DG Abd Portable 1V  Result Date: 08/01/2020 CLINICAL DATA:  Ileus.  History of constipation. EXAM: PORTABLE ABDOMEN - 1 VIEW COMPARISON:  07/24/2020. FINDINGS: Mildly distended loops of small and large bowel. Mild adynamic ileus cannot be excluded. No large stool volume. No free air identified. Degenerative change lumbar spine. IMPRESSION: Mildly distended loops of small large bowel. Mild adynamic ileus cannot be excluded. No large stool volume. Electronically Signed  ByMaisie Fus  Register   On: 08/01/2020 08:54   ECHOCARDIOGRAM COMPLETE  Result Date: 07/24/2020    ECHOCARDIOGRAM REPORT   Patient Name:   VASHON RIORDAN Date of Exam: 07/24/2020 Medical Rec #:  [id number]      Height:       73.0 in Accession #:    [id number]     Weight:       250.0 lb Date of Birth:  [birth year]      BSA:          2.366 m Patient Age:    69 years       BP:           128/87 mmHg Patient Gender: M              HR:           72 bpm. Exam Location:  Inpatient Procedure: 2D Echo Indications:    780.2 syncope  History:        Patient has prior history of Echocardiogram examinations, most                 recent 02/20/2020. Arrythmias:Atrial Fibrillation; Risk                 Factors:Hypertension. LBBB.  Sonographer:    Celene Skeen RDCS (AE) Referring Phys: [id number] ANASTASSIA DOUTOVA  Sonographer Comments: Image acquisition challenging due to patient body habitus and Image acquisition challenging due to respiratory motion. IMPRESSIONS  1. Poor acoustic windows limt study. Overall LVEF is normal  ENdocardium is difficult to see, especially at apex. Consider limited echo with Definity to confirm wall motion. . Left ventricular ejection fraction, by estimation, is 55 to 60%. The left ventricle has normal function. Left ventricular endocardial border not optimally defined to evaluate regional wall motion. There is severe asymmetric left ventricular hypertrophy. Left ventricular diastolic parameters are indeterminate.  2. Right ventricular systolic function is normal. The right ventricular size is mildly enlarged.  3. The mitral valve is abnormal. Mild mitral valve regurgitation.  4. The aortic valve is abnormal. Aortic valve regurgitation is not visualized. Mild aortic valve sclerosis is present, with no evidence of aortic valve stenosis.  5. The inferior vena cava is normal in size with greater than 50% respiratory variability, suggesting right atrial pressure of 3 mmHg. FINDINGS  Left Ventricle: Poor acoustic windows limt study. Overall LVEF is normal ENdocardium is difficult to see, especially at apex. Consider limited echo with Definity to confirm wall motion. Left ventricular ejection fraction, by estimation, is 55 to 60%. The left ventricle has normal function. Left ventricular endocardial border not optimally defined to evaluate regional wall motion. The left ventricular internal cavity size was normal in size. There is severe asymmetric left ventricular hypertrophy. Left ventricular diastolic parameters are indeterminate. Right Ventricle: The right ventricular size is mildly enlarged. Right vetricular wall thickness was not assessed. Right ventricular systolic function is normal. Left Atrium: Left atrial size was normal in size. Right Atrium: Right atrial size was normal in size. Pericardium: There is no evidence of pericardial effusion. Mitral Valve: The mitral valve is abnormal. There is mild thickening of the mitral valve leaflet(s). Mild to moderate mitral annular calcification. Mild mitral valve  regurgitation. Tricuspid Valve: The tricuspid valve is not well visualized. Tricuspid valve regurgitation is mild. Aortic Valve: The aortic valve is abnormal. Aortic valve regurgitation is not visualized. Mild aortic valve sclerosis is present, with no evidence of aortic valve stenosis. Aortic valve mean gradient measures  8.0 mmHg. Aortic valve peak gradient measures  13.5 mmHg. Aortic valve area, by VTI measures 2.27 cm. Pulmonic Valve: The pulmonic valve was not well visualized. Pulmonic valve regurgitation is not visualized. No evidence of pulmonic stenosis. Aorta: The aortic root is normal in size and structure. Venous: The inferior vena cava is normal in size with greater than 50% respiratory variability, suggesting right atrial pressure of 3 mmHg. IAS/Shunts: The interatrial septum was not assessed.  LEFT VENTRICLE PLAX 2D LVIDd:         4.02 cm LVIDs:         3.08 cm LV PW:         1.33 cm LV IVS:        13.00 cm LVOT diam:     2.20 cm LV SV:         75 LV SV Index:   32 LVOT Area:     3.80 cm  RIGHT VENTRICLE TAPSE (M-mode): 1.3 cm LEFT ATRIUM             Index       RIGHT ATRIUM           Index LA diam:        3.50 cm 1.48 cm/m  RA Area:     18.30 cm LA Vol (A2C):   66.1 ml 27.94 ml/m RA Volume:   43.80 ml  18.51 ml/m LA Vol (A4C):   33.8 ml 14.29 ml/m LA Biplane Vol: 49.3 ml 20.84 ml/m  AORTIC VALVE AV Area (Vmax):    2.15 cm AV Area (Vmean):   2.37 cm AV Area (VTI):     2.27 cm AV Vmax:           184.00 cm/s AV Vmean:          130.000 cm/s AV VTI:            0.332 m AV Peak Grad:      13.5 mmHg AV Mean Grad:      8.0 mmHg LVOT Vmax:         104.00 cm/s LVOT Vmean:        81.200 cm/s LVOT VTI:          0.198 m LVOT/AV VTI ratio: 0.60  AORTA Ao Root diam: 3.10 cm MITRAL VALVE MV Area (PHT): 2.99 cm    SHUNTS MV Decel Time: 254 msec    Systemic VTI:  0.20 m MV E velocity: 91.30 cm/s  Systemic Diam: 2.20 cm Dietrich Pates MD Electronically signed by Dietrich Pates MD Signature Date/Time: 07/24/2020/2:33:10  PM    Final    ECHO INTRAOPERATIVE TEE  Result Date: 07/30/2020  *INTRAOPERATIVE TRANSESOPHAGEAL REPORT *  Patient Name:   RISHON THILGES Date of Exam: 07/29/2020 Medical Rec #:  [id number]      Height:       73.0 in Accession #:    [id number]     Weight:       256.9 lb Date of Birth:  [birth year]      BSA:          2.39 m Patient Age:    69 years       BP:           123/79 mmHg Patient Gender: M              HR:           62 bpm. Exam Location:  Anesthesiology Transesophogeal exam was perform intraoperatively during surgical procedure. Patient  was closely monitored under general anesthesia during the entirety of examination. Indications:     CAD Native Vessel Sonographer:     Thurman Coyer RDCS (AE) Performing Phys: [id number] Ephriam Knuckles Z ATKINS Diagnosing Phys: Achille Rich MD Complications: No known complications during this procedure. POST-OP IMPRESSIONS - Left Ventricle: The left ventricle is unchanged from pre-bypass. - Right Ventricle: The right ventricle appears unchanged from pre-bypass. - Aorta: The aorta appears unchanged from pre-bypass. - Left Atrium: The left atrium appears unchanged from pre-bypass. - Left Atrial Appendage: Has been surgically closed and the repair appears to be oversewn. - Aortic Valve: The aortic valve appears unchanged from pre-bypass. - Mitral Valve: The mitral valve appears unchanged from pre-bypass. - Tricuspid Valve: The tricuspid valve appears unchanged from pre-bypass. - Interatrial Septum: The interatrial septum appears unchanged from pre-bypass. - Interventricular Septum: The interventricular septum appears unchanged from pre-bypass. - Pericardium: The pericardium appears unchanged from pre-bypass. PRE-OP FINDINGS  Left Ventricle: The left ventricle has mildly reduced systolic function, with an ejection fraction of 45-50%. The cavity size was normal. There is mildly increased left ventricular wall thickness. No evidence of left ventricular regional wall motion  abnormalities. Right Ventricle: The right ventricle has normal systolic function. The cavity was normal. There is no increase in right ventricular wall thickness. Left Atrium: Left atrial size was normal in size. The left atrial appendage is well visualized and there is no evidence of thrombus present. Right Atrium: Right atrial size was normal in size. Interatrial Septum: No atrial level shunt detected by color flow Doppler. Pericardium: There is no evidence of pericardial effusion. Mitral Valve: The mitral valve is normal in structure. Mitral valve regurgitation is mild by color flow Doppler. There is No evidence of mitral stenosis. Tricuspid Valve: The tricuspid valve was normal in structure. Tricuspid valve regurgitation is trivial by color flow Doppler. Aortic Valve: The aortic valve is tricuspid There is mild thickening of the aortic valve Aortic valve regurgitation was not visualized by color flow Doppler. There is no evidence of aortic valve stenosis. Right coronary cusp excursion is reduced. Pulmonic Valve: The pulmonic valve was normal in structure. Pulmonic valve regurgitation is not visualized by color flow Doppler. Aorta: The aortic root, ascending aorta and aortic arch are normal in size and structure.  Achille Rich MD Electronically signed by Achille Rich MD Signature Date/Time: 07/30/2020/6:58:54 PM    Final    VAS US DOPPLER PRE CABG  Result Date: 07/28/2020 PREOPERATIVE VASCULAR EVALUATION  Indications:      Pre-CABG. Risk Factors:     Hypertension. Comparison Study: No prior studies. Performing Technologist: Olen Cordial RVT  Examination Guidelines: A complete evaluation includes B-mode imaging, spectral Doppler, color Doppler, and power Doppler as needed of all accessible portions of each vessel. Bilateral testing is considered an integral part of a complete examination. Limited examinations for reoccurring indications may be performed as noted.  Right Carotid Findings:  +----------+--------+--------+--------+-----------------------+--------+           PSV cm/sEDV cm/sStenosisDescribe               Comments +----------+--------+--------+--------+-----------------------+--------+ CCA Prox  143     15              smooth and heterogenous         +----------+--------+--------+--------+-----------------------+--------+ CCA Distal66      10              smooth and heterogenous         +----------+--------+--------+--------+-----------------------+--------+ ICA Prox  50      19              calcific                        +----------+--------+--------+--------+-----------------------+--------+ ICA Distal96      31                                     tortuous +----------+--------+--------+--------+-----------------------+--------+ ECA       128     15                                              +----------+--------+--------+--------+-----------------------+--------+ Portions of this table do not appear on this page. +----------+--------+-------+--------+------------+           PSV cm/sEDV cmsDescribeArm Pressure +----------+--------+-------+--------+------------+ Subclavian101                                 +----------+--------+-------+--------+------------+ +---------+--------+---+--------+--+---------+ VertebralPSV cm/s156EDV cm/s34Antegrade +---------+--------+---+--------+--+---------+ Left Carotid Findings: +----------+--------+--------+--------+-----------------------+--------+           PSV cm/sEDV cm/sStenosisDescribe               Comments +----------+--------+--------+--------+-----------------------+--------+ CCA Prox  79      18              smooth and heterogenous         +----------+--------+--------+--------+-----------------------+--------+ CCA Distal53      10              smooth and heterogenous         +----------+--------+--------+--------+-----------------------+--------+ ICA Prox   237     77      60-79%  calcific                        +----------+--------+--------+--------+-----------------------+--------+ ICA Mid   199     37              smooth and heterogenous         +----------+--------+--------+--------+-----------------------+--------+ ICA Distal97      17                                     tortuous +----------+--------+--------+--------+-----------------------+--------+ ECA       99      11                                              +----------+--------+--------+--------+-----------------------+--------+ +----------+--------+--------+--------+------------+ SubclavianPSV cm/sEDV cm/sDescribeArm Pressure +----------+--------+--------+--------+------------+           128                                  +----------+--------+--------+--------+------------+ +---------+--------+--+--------+--+---------+ VertebralPSV cm/s53EDV cm/s24Antegrade +---------+--------+--+--------+--+---------+  ABI Findings: +--------+------------------+-----+---------+--------+ Right   Rt Pressure (mmHg)IndexWaveform Comment  +--------+------------------+-----+---------+--------+ [id number]                    triphasic         +--------+------------------+-----+---------+--------+ PTA  172               1.10 biphasic          +--------+------------------+-----+---------+--------+ DP      145               0.93 biphasic          +--------+------------------+-----+---------+--------+ +--------+------------------+-----+---------+-------+ Left    Lt Pressure (mmHg)IndexWaveform Comment +--------+------------------+-----+---------+-------+ Brachial150                    triphasic        +--------+------------------+-----+---------+-------+ PTA     250               1.60 triphasic        +--------+------------------+-----+---------+-------+ DP      161               1.03 biphasic          +--------+------------------+-----+---------+-------+ +-------+---------------+----------------+ ABI/TBIToday's ABI/TBIPrevious ABI/TBI +-------+---------------+----------------+ Right  1.1                             +-------+---------------+----------------+ Left   1.6                             +-------+---------------+----------------+  Right Doppler Findings: +--------+--------+-----+---------+--------+ Site    PressureIndexDoppler  Comments +--------+--------+-----+---------+--------+ [id number]          triphasic         +--------+--------+-----+---------+--------+ Radial               triphasic         +--------+--------+-----+---------+--------+ Ulnar                triphasic         +--------+--------+-----+---------+--------+  Left Doppler Findings: +--------+--------+-----+---------+--------+ Site    PressureIndexDoppler  Comments +--------+--------+-----+---------+--------+ Brachial150          triphasic         +--------+--------+-----+---------+--------+ Radial               triphasic         +--------+--------+-----+---------+--------+ Ulnar                triphasic         +--------+--------+-----+---------+--------+  Summary: Right Carotid: Velocities in the right ICA are consistent with a 1-39% stenosis. Left Carotid: Velocities in the left ICA are consistent with a 60-79% stenosis. Vertebrals: Bilateral vertebral arteries demonstrate antegrade flow. Right ABI: Resting right ankle-brachial index is within normal range. No evidence of significant right lower extremity arterial disease. Left ABI: Resting left ankle-brachial index indicates noncompressible left lower extremity arteries. Right Upper Extremity: Doppler waveform obliterate with right radial compression. Doppler waveform obliterate with right ulnar compression. Left Upper Extremity: Doppler waveform obliterate with left radial compression. Doppler waveform obliterate with left  ulnar compression.  Electronically signed by Fabienne Bruns MD on 07/28/2020 at 5:18:01 PM.    Final     Assessment/Plan  1. Multi-vessel coronary artery stenosis -  S/P CABG x4 on 9/21 -Continue PRN Oxycodone  And PRN Acetaminophen for pain, isosorbide dinitrate -Sternal precautions -Follow-up with cardiothoracic surgery, vascular surgery and cardiology on 08/11/2020  2. Permanent atrial fibrillation (HCC) -Rate controlled, continue metoprolol tartrate and amiodarone for rate control and Xarelto for anticoagulation  3. Anemia due to blood loss, acute Lab Results  Component Value Date   HGB 9.0 (L) 08/03/2020   -  stable  4. Hypokalemia Lab Results  Component Value Date   K 3.7 08/05/2020   -Stable  5.  Physical deconditioning -For PT and OT, for therapeutic strengthening exercises -Fall precautions     Family/ staff Communication: Discussed plan of care with resident and charge nurse.  Labs/tests ordered:  None  Goals of care:   Short-term care   Kenard GowerMonina Medina-Vargas, DNP, MSN, FNP-BC [facility] [phone] (Monday-Friday 8:00 a.m. - 5:00 p.m.) [phone] (after hours)

## 2020-08-07 ENCOUNTER — Telehealth (HOSPITAL_COMMUNITY): Payer: Self-pay

## 2020-08-07 ENCOUNTER — Encounter: Payer: Self-pay | Admitting: Internal Medicine

## 2020-08-07 ENCOUNTER — Non-Acute Institutional Stay (SKILLED_NURSING_FACILITY): Payer: Medicare Other | Admitting: Internal Medicine

## 2020-08-07 DIAGNOSIS — E440 Moderate protein-calorie malnutrition: Secondary | ICD-10-CM

## 2020-08-07 DIAGNOSIS — D62 Acute posthemorrhagic anemia: Secondary | ICD-10-CM

## 2020-08-07 DIAGNOSIS — I2510 Atherosclerotic heart disease of native coronary artery without angina pectoris: Secondary | ICD-10-CM | POA: Insufficient documentation

## 2020-08-07 NOTE — Assessment & Plan Note (Signed)
Iron supplementation

## 2020-08-07 NOTE — Progress Notes (Signed)
NURSING HOME LOCATION:  [facility] ROOM NUMBER: 112/A   CODE STATUS:  Full Code  PCP:  [name], PA-C   This is a comprehensive admission note to [facility] performed on this date less than 30 days from date of admission. Included are preadmission medical/surgical history; reconciled medication list; family history; social history and comprehensive review of systems.  Corrections and additions to the records were documented. Comprehensive physical exam was also performed. Additionally a clinical summary was entered for each active diagnosis pertinent to this admission in the Problem List to enhance continuity of care.  HPI: Patient was hospitalized 9/15-9/28/2021 after an episode of dizziness 3 weeks prior to this admission.  At that time Holter monitor was placed at discharge.  He was readmitted for paroxysmal malaise which led him to call 911.  Echo revealed reduced ejection fraction; nuclear stress test suggested perfusion deficit.  Left heart cath and coronary angiogram was performed 9/17.  Cath revealed multivessel CAD.  Cardiothoracic surgery was consulted and the following procedures were completed 9/21 by Dr. Vickey Sages after a washout period  of direct thrombin inhibitors. Procedures included clipping of the atrial appendage, CABG X 4, bi-atrial  MAZE procedure, and radial artery harvest. Postop he did have mild ileus and postop blood loss anemia.  Isordil was initiated postop for the radial artery graft.  He required pacing and his beta-blocker was held.  Amiodarone was initiated because of the history of A. fib and post MAZE procedure status.  Xarelto was reinitiated at that time.  Aggressive diuretic therapy with twice daily IV Lasix was also initiated. There was elevation in the creatinine with a peak of 1.31.  He experienced episodes of nausea and vomiting and bowel sounds were absent.  Imaging revealed mild gaseous dilation of his bowels.  Reglan was initiated  and diet advance slowly with resolution of symptoms. PT/OT recommended rehab at the SNF because of significant clinical deconditioning. At discharge it was requested that vital signs be performed daily with daily weights.  Patient was to ambulate at least 3 times daily with precautions recommended because of the sternal surgery.  Past medical and surgical history: Includes left bundle branch block, dyslipidemia, essential hypertension, and atrial fibrillation. Procedures and surgeries include cardioversion in November 2017.  Social history: Nondrinker; never smoked.  Family history: Limited history reviewed.   Review of systems: He is an excellent historian.  He stated that prior to admission he had had 2 days of frankly watery stool.  Imodium A-D had been prescribed in urgent care.  The morning after taking this medicine he was profoundly weak upon arising which prompted the 911 call.  He states that he tends to alternate between constipation and loose stool.  The case of diarrhea was unrelated to any antibiotic courses. He has cough productive of clear phlegm.  He has no constitutional or extrinsic symptoms. He states that he will take 2 Tylenol at bedtime to aid sleep.  It is not Tylenol PM. He states that he was told his Holter monitor showed 9 seconds of absence of heart rhythm on 3 episodes while he was asleep.  He has no knowledge of sleep apnea or excessive snoring. He has some numbness in hand distal to long harvest scar @ L forearm. PT/OT report he is ambulating up to 60 ft w/o symptoms.  Constitutional: No fever, significant weight change Eyes: No redness, discharge, pain, vision change ENT/mouth: No nasal congestion, purulent discharge, earache, change in hearing, sore throat  Cardiovascular: No chest pain, palpitations, paroxysmal nocturnal dyspnea, claudication  Respiratory: No hemoptysis Gastrointestinal: No heartburn, dysphagia, abdominal pain, nausea /vomiting, rectal  bleeding, melena Genitourinary: No dysuria, hematuria, pyuria, incontinence, nocturia Musculoskeletal: No joint stiffness, joint swelling Dermatologic: No rash, pruritus, change in appearance of skin Neurologic: No dizziness, headache, syncope, seizures Psychiatric: No significant anxiety, depression, insomnia, anorexia Endocrine: No change in hair/skin/nails, excessive thirst, excessive hunger, excessive urination  Hematologic/lymphatic: No significant bruising, lymphadenopathy, abnormal bleeding Allergy/immunology: No itchy/watery eyes, significant sneezing, urticaria, angioedema  Physical exam:  Pertinent or positive findings: He is obese.  Hair is long and disheveled.  He has bilateral ptosis, greater on the right.  Heart sounds are distant; clinically the rhythm appears regular.  Breath sounds are also decreased but he has some inspiratory rhonchi especially on the left.  Abdomen is protuberant.  He has 1/2+ pedal edema.  Pedal pulses are decreased.  Strength to opposition is good; left upper extremity strength was tested with grip rather than opposition as he has an extensive op scar over the ventral forearm which is extensively stapled.  General appearance: Adequately nourished; no acute distress, increased work of breathing is present.   Lymphatic: No lymphadenopathy about the head, neck, axilla. Eyes: No conjunctival inflammation or lid edema is present. There is no scleral icterus. Ears:  External ear exam shows no significant lesions or deformities.   Nose:  External nasal examination shows no deformity or inflammation. Nasal mucosa are pink and moist without lesions, exudates Oral exam: Lips and gums are healthy appearing.There is no oropharyngeal erythema or exudate. Neck:  No thyromegaly, masses, tenderness noted.    Heart:  No gallop, murmur, click, rub.  Lungs:  without wheezes, rales, rubs. Abdomen: Bowel sounds are normal.  Abdomen is soft and nontender with no organomegaly,  hernias, masses. GU: Deferred  Extremities:  No cyanosis, clubbing. Neurologic exam: Balance, Rhomberg, finger to nose testing could not be completed due to clinical state Skin: Warm & dry w/o tenting. No significant lesions or rash.  See clinical summary under each active problem in the Problem List with associated updated therapeutic plan

## 2020-08-07 NOTE — Patient Instructions (Signed)
See assessment and plan under each diagnosis in the problem list and acutely for this visit 

## 2020-08-07 NOTE — Assessment & Plan Note (Signed)
At this time the patient denies any cardiac symptoms and is not taking the opioid.

## 2020-08-07 NOTE — Telephone Encounter (Signed)
Pt insurance is active and benefits verified through [facility] Medicare. Co-pay $0.00, DED $0.00/$0.00 met, out of pocket $3,600.00/$1,045.19 met, co-insurance 0%. No pre-authorization required. Passport, 08/07/20 @ 1:53PM, CMI#[id number]  Will contact patient to see if he is interested in the Cardiac Rehab Program. If interested, patient will need to complete follow up appt. Once completed, patient will be contacted for scheduling upon review by the RN Navigator.

## 2020-08-07 NOTE — Telephone Encounter (Signed)
Attempted to call patient in regards to Cardiac Rehab - unable to leave VM 

## 2020-08-07 NOTE — Assessment & Plan Note (Signed)
Potential Tylenol dose could be 4000 mg a day.  This was discussed with the patient and dose adjusted.

## 2020-08-07 NOTE — Assessment & Plan Note (Addendum)
Speech Therapy and Nutrition consults at SNF.

## 2020-08-08 ENCOUNTER — Other Ambulatory Visit: Payer: Self-pay | Admitting: Cardiothoracic Surgery

## 2020-08-08 DIAGNOSIS — I2510 Atherosclerotic heart disease of native coronary artery without angina pectoris: Secondary | ICD-10-CM

## 2020-08-11 ENCOUNTER — Ambulatory Visit: Payer: Medicare Other | Admitting: Cardiology

## 2020-08-11 ENCOUNTER — Ambulatory Visit
Admission: RE | Admit: 2020-08-11 | Discharge: 2020-08-11 | Disposition: A | Discharge status: Home / Self Care | Payer: Medicare Other | Source: Ambulatory Visit | Attending: Cardiothoracic Surgery | Admitting: Cardiothoracic Surgery

## 2020-08-11 ENCOUNTER — Other Ambulatory Visit: Payer: Self-pay

## 2020-08-11 ENCOUNTER — Other Ambulatory Visit: Payer: Self-pay | Admitting: Cardiothoracic Surgery

## 2020-08-11 ENCOUNTER — Ambulatory Visit (INDEPENDENT_AMBULATORY_CARE_PROVIDER_SITE_OTHER): Payer: Self-pay | Admitting: Cardiothoracic Surgery

## 2020-08-11 VITALS — BP 121/65 | HR 81 | Temp 97.8°F | Resp 22 | Ht 73.0 in | Wt 256.5 lb

## 2020-08-11 DIAGNOSIS — Z951 Presence of aortocoronary bypass graft: Secondary | ICD-10-CM

## 2020-08-11 DIAGNOSIS — I2510 Atherosclerotic heart disease of native coronary artery without angina pectoris: Secondary | ICD-10-CM

## 2020-08-13 ENCOUNTER — Non-Acute Institutional Stay (SKILLED_NURSING_FACILITY): Payer: Medicare Other | Admitting: Adult Health

## 2020-08-13 ENCOUNTER — Encounter: Payer: Self-pay | Admitting: Adult Health

## 2020-08-13 DIAGNOSIS — I2510 Atherosclerotic heart disease of native coronary artery without angina pectoris: Secondary | ICD-10-CM | POA: Diagnosis not present

## 2020-08-13 DIAGNOSIS — D62 Acute posthemorrhagic anemia: Secondary | ICD-10-CM

## 2020-08-13 DIAGNOSIS — I4821 Permanent atrial fibrillation: Secondary | ICD-10-CM | POA: Diagnosis not present

## 2020-08-13 LAB — BASIC METABOLIC PANEL
BUN/Creatinine Ratio: 19 (ref 10–24)
BUN: 18 mg/dL (ref 8–27)
CO2: 21 mmol/L (ref 20–29)
Calcium: 9.3 mg/dL (ref 8.6–10.2)
Chloride: 106 mmol/L (ref 96–106)
Creatinine, Ser: 0.94 mg/dL (ref 0.76–1.27)
GFR calc Af Amer: 97 mL/min/{1.73_m2} (ref >59)
GFR calc non Af Amer: 84 mL/min/{1.73_m2} (ref >59)
Glucose: 97 mg/dL (ref 65–99)
Potassium: 4.8 mmol/L (ref 3.5–5.2)
Sodium: 140 mmol/L (ref 134–144)

## 2020-08-13 LAB — MAGNESIUM: Magnesium: 2.1 mg/dL (ref 1.6–2.3)

## 2020-08-13 LAB — HEMOGLOBIN AND HEMATOCRIT, BLOOD
Hematocrit: 43.3 % (ref 37.5–51.0)
Hemoglobin: 14.4 g/dL (ref 13.0–17.7)

## 2020-08-13 NOTE — Progress Notes (Signed)
[address]       [name] [address]             [phone]     CARDIOTHORACIC SURGERY OFFICE NOTE  Referring Provider is Yates Decamp, MD Primary Cardiologist is No primary care provider on file. PCP is [name], PA-C   HPI:  69 yo man presents for 1st visit after recent discharge from hospital where he underwent CABG and atrial ablation. He is presently in a rehab facility and doing well. Receiving PT daily.  He denies chest pain but does have some SOB with exertion. Denies f/c; is eating ok   Current Outpatient Medications  Medication Sig Dispense Refill   acetaminophen (TYLENOL) 500 MG tablet Take 2 tablets (1,000 mg total) by mouth every 6 (six) hours as needed. 30 tablet 0   amiodarone (PACERONE) 200 MG tablet Take 1 tablet (200 mg total) by mouth daily.     aspirin EC 81 MG EC tablet Take 1 tablet (81 mg total) by mouth daily. Swallow whole. 30 tablet 11   Ensure (ENSURE) Take 237 mLs by mouth daily. Vanilla if available     guaiFENesin (MUCINEX) 600 MG 12 hr tablet Take 1 tablet (600 mg total) by mouth 2 (two) times daily as needed.     isosorbide dinitrate (ISORDIL) 10 MG tablet Take 1 tablet (10 mg total) by mouth 3 (three) times daily. Take for 3 weeks then stop. 70 tablet    melatonin 5 MG TABS Take 5 mg by mouth at bedtime.     metoprolol tartrate (LOPRESSOR) 25 MG tablet Take 0.5 tablets (12.5 mg total) by mouth 2 (two) times daily.     NON FORMULARY Heart Healthy chopped/thin diet consistency     oxyCODONE (OXY IR/ROXICODONE) 5 MG immediate release tablet Take 1 tablet (5 mg total) by mouth every 4 (four) hours as needed for severe pain. 26 tablet 0   potassium chloride SA (KLOR-CON) 20 MEQ tablet Take 1 tablet (20 mEq total) by mouth daily.     rosuvastatin (CRESTOR) 40 MG tablet Take 1 tablet (40 mg total) by mouth daily.     XARELTO 20 MG TABS tablet TAKE 1 TABLET BY MOUTH IN  THE EVENING AFTER DINNER 90 tablet 1    No current facility-administered medications for this visit.      Physical Exam:   BP 121/65    Pulse 81    Temp 97.8 F (36.6 C)    Resp (!) 22    Ht 6\' 1"  (1.854 m)    Wt 116.3 kg Comment: per patient, his weight this morning   SpO2 94% Comment: RA with mask on   BMI 33.84 kg/m   General:  Chronically ill appearing, NAD  Chest:   Decreased BS left base  CV:   rrr  Incisions:  Healing well; staples removed  Abdomen:  sntnd  Extremities:  Mild edema  Diagnostic Tests:  cxr with moderate left effusion   Impression:  Making good progress after CABG  Plan:  sontinue present med regimen F/u in 3 weeks for wound check Arrange image-guided left thoracentesis  I spent in excess of 15 minutes during the conduct of this office consultation and >50% of this time involved direct face-to-face encounter with the patient for counseling and/or coordination of their care.  Level 2                 10 minutes Level 3  15 minutes Level 4                 25 minutes Level 5                 40 minutes  B. Lorayne Marek, MD 08/13/2020 9:27 AM

## 2020-08-13 NOTE — Progress Notes (Signed)
Location:  [facility] Room Number: 112-A Place of Service:  SNF (31) Provider:  Kenard Gower, DNP, FNP-BC  Patient Care Team: Gala Lewandowsky as PCP - General [facility] Medicine)  Extended Emergency Contact Information Primary Emergency Contact: Paige, [place]ou[place] Mobile Phone: [phone] Relation: Brother Secondary Emergency Contact: Max, Nuno Mobile Phone: [phone] Relation: Aunt  Code Status:  FULL CODE  Goals of care: Advanced Directive information Advanced Directives 08/07/2020  Does Patient Have a Medical Advance Directive? Yes  Type of Advance Directive (No Data)  Does patient want to make changes to medical advance directive? No - Patient declined  Would patient like information on creating a medical advance directive? -     Chief Complaint  Patient presents with  . Medical Management of Chronic Issues    Short-term rehabilitation.     HPI:  Pt is a 69 y.o. male seen today for medical management of chronic diseases. He is a short-term rehabilitation resident of [facility]. He has a PMH of atrial fibrillation S/P attempted cardioversion, hypertension and left BBB. He was seen in his room today. He complains that he is still shor of breath when walking 30 ft. Hecstated that he had chest x-ray done 2 days ago and was told that he needs to have chest fluids drained out. He thinks he is not ready to go home yet. He denies having pain. Currently, he has orders for oxycodone 5 mg every 4 hours PRN for pain.  He was admitted to [facility] on 08/05/2020 post [facility] hospitalization 07/23/2020 to 08/05/2020 for multivessel coronary artery stenosis S/P CABG x4 on 07/29/2020.  Past Medical History:  Diagnosis Date  . Atrial fibrillation (HCC)   . Dysrhythmia   . Hypertension   . LBBB (left bundle branch block)    Past Surgical History:  Procedure Laterality Date  . CARDIOVERSION N/A  09/28/2016   Procedure: CARDIOVERSION;  Surgeon: Yates Decamp, MD;  Location: [facility] ENDOSCOPY;  Service: Cardiovascular;  Laterality: N/A;  . CLIPPING OF ATRIAL APPENDAGE Left 07/29/2020   Procedure: CLIPPING OF ATRIAL APPENDAGE;  Surgeon: Linden Dolin, MD;  Location: [facility] OR;  Service: Open Heart Surgery;  Laterality: Left;  . CORONARY ARTERY BYPASS GRAFT N/A 07/29/2020   Procedure: CORONARY ARTERY BYPASS GRAFTING (CABG) TIMES FOUR USING BILATERAL INTERNAL MAMMARIES AND LEFT RADIAL ARTERY;  Surgeon: Linden Dolin, MD;  Location: [facility] OR;  Service: Open Heart Surgery;  Laterality: N/A;  . LEFT HEART CATH AND CORONARY ANGIOGRAPHY N/A 07/25/2020   Procedure: LEFT HEART CATH AND CORONARY ANGIOGRAPHY;  Surgeon: Yates Decamp, MD;  Location: [facility] INVASIVE CV LAB;  Service: Cardiovascular;  Laterality: N/A;  . MAZE N/A 07/29/2020   Procedure: MAZE;  Surgeon: Linden Dolin, MD;  Location: [facility] OR;  Service: Open Heart Surgery;  Laterality: N/A;  . NO PAST SURGERIES    . RADIAL ARTERY HARVEST Left 07/29/2020   Procedure: RADIAL ARTERY HARVEST;  Surgeon: Linden Dolin, MD;  Location: [facility] OR;  Service: Open Heart Surgery;  Laterality: Left;  . TEE WITHOUT CARDIOVERSION N/A 07/29/2020   Procedure: TRANSESOPHAGEAL ECHOCARDIOGRAM (TEE);  Surgeon: Linden Dolin, MD;  Location: [facility] OR;  Service: Open Heart Surgery;  Laterality: N/A;    No Known Allergies  Outpatient Encounter Medications as of 08/13/2020  Medication Sig  . acetaminophen (TYLENOL) 325 MG tablet Take 650 mg by mouth every 6 (six) hours as needed for mild pain.  Marland Kitchen amiodarone (PACERONE) 200 MG tablet  Take 1 tablet (200 mg total) by mouth daily.  Marland Kitchen aspirin EC 81 MG EC tablet Take 1 tablet (81 mg total) by mouth daily. Swallow whole.  . Ensure (ENSURE) Take 237 mLs by mouth daily. Vanilla if available  . ferrous sulfate 325 (65 FE) MG tablet Take 325 mg by mouth every Monday, Wednesday, and Friday.  Marland Kitchen guaiFENesin (MUCINEX) 600 MG 12 hr tablet Take 1  tablet (600 mg total) by mouth 2 (two) times daily as needed.  . isosorbide dinitrate (ISORDIL) 10 MG tablet Take 1 tablet (10 mg total) by mouth 3 (three) times daily. Take for 3 weeks then stop.  . melatonin 5 MG TABS Take 5 mg by mouth at bedtime.  . metoprolol tartrate (LOPRESSOR) 25 MG tablet Take 0.5 tablets (12.5 mg total) by mouth 2 (two) times daily.  . NON FORMULARY Heart Healthy chopped/thin diet consistency  . oxyCODONE (OXY IR/ROXICODONE) 5 MG immediate release tablet Take 1 tablet (5 mg total) by mouth every 4 (four) hours as needed for severe pain.  . potassium chloride SA (KLOR-CON) 20 MEQ tablet Take 1 tablet (20 mEq total) by mouth daily.  . rosuvastatin (CRESTOR) 40 MG tablet Take 1 tablet (40 mg total) by mouth daily.  Carlena Hurl 20 MG TABS tablet TAKE 1 TABLET BY MOUTH IN  THE EVENING AFTER DINNER  . [DISCONTINUED] acetaminophen (TYLENOL) 500 MG tablet Take 2 tablets (1,000 mg total) by mouth every 6 (six) hours as needed.   No facility-administered encounter medications on file as of 08/13/2020.    Review of Systems  GENERAL: No change in appetite, no fatigue, no weight changes, no fever, chills or weakness MOUTH and THROAT: Denies oral discomfort, gingival pain or bleeding RESPIRATORY: no cough, SOB, DOE, wheezing, hemoptysis CARDIAC: No chest pain or palpitations GI: No abdominal pain, diarrhea, constipation, heart burn, nausea or vomiting GU: Denies dysuria, frequency, hematuria, incontinence, or discharge NEUROLOGICAL: Denies dizziness, syncope, numbness, or headache PSYCHIATRIC: Denies feelings of depression or anxiety. No report of hallucinations, insomnia, paranoia, or agitation   Immunization History  Administered Date(s) Administered  . Influenza, High Dose Seasonal PF 07/27/2019, 08/16/2019  . Influenza, Seasonal, Injecte, Preservative Fre 08/24/2014  . Influenza,inj,Quad PF,6+ Mos 08/05/2016  . Influenza-Unspecified 08/24/2014  . PFIZER SARS-COV-2  Vaccination 12/13/2019, 01/04/2020  . Tdap 02/02/2015   Pertinent  Health Maintenance Due  Topic Date Due  . COLONOSCOPY  Never done  . PNA vac Low Risk Adult (1 of 2 - PCV13) Never done  . INFLUENZA VACCINE  06/08/2020   No flowsheet data found.   Vitals:   08/13/20 1030  BP: 126/75  Pulse: 71  Resp: 16  Temp: 97.8 F (36.6 C)  TempSrc: Oral  Weight: 257 lb 3.2 oz (116.7 kg)  Height:  (1.854 m)   Body mass index is 33.93 kg/m.  Physical Exam  GENERAL APPEARANCE: Well nourished. In no acute distress. Obese SKIN: chest  Midline and left anterior forearm surgical wound with steri-strips, dry MOUTH and THROAT: Lips are without lesions. Oral mucosa is moist and without lesions. Tongue is normal in shape, size, and color and without lesions RESPIRATORY: Breathing is even & unlabored, BS CTAB CARDIAC: RRR, no murmur,no extra heart sounds GI: Abdomen soft, normal BS, no masses, no tenderness EXTREMITIES:  Able to move X 4 extremities NEUROLOGICAL: There is no tremor. Speech is clear. Alert and oriented X 3. PSYCHIATRIC:  Affect and behavior are appropriate  Labs reviewed: Recent Labs    07/24/20 0210  07/24/20 0748 07/24/20 1122 07/25/20 0422 07/30/20 0403 07/30/20 0403 07/30/20 1707 07/31/20 0440 08/03/20 0104 08/05/20 0132 08/12/20 0926  NA 137   < >  --    < > 138   < > 135   < > 134* 133* 140  K 3.5   < >  --    < > 4.4   < > 4.4   < > 3.4* 3.7 4.8  CL 108   < >  --    < > 107   < > 105   < > 98 96* 106  CO2 18*   < >  --    < > 22   < > 19*   < > 25 28 21   GLUCOSE 104*   < >  --    < > 143*   < > 174*   < > 105* 107* 97  BUN 17   < >  --    < > 7*   < > 8   < > 23 15 18   CREATININE 1.74*   < >  --    < > 1.07   < > 1.17   < > 1.31* 1.24 0.94  CALCIUM 8.3*   < >  --    < > 8.4*   < > 8.3*   < > 8.3* 8.1* 9.3  MG 1.6*  --  1.7   < > 2.6*  --  2.2  --   --   --  2.1  PHOS 2.8  --  2.8  --   --   --   --   --   --   --   --    < > = values in this interval  not displayed.   Recent Labs    07/24/20 0748 07/26/20 0515 07/27/20 0201  AST 34 22 21  ALT 32 21 21  ALKPHOS 163* 120 105  BILITOT 1.9* 1.7* 1.4*  PROT 6.0* 6.0* 5.7*  ALBUMIN 2.7* 2.5* 2.4*   Recent Labs    07/02/20 1604 07/23/20 1221 07/24/20 1122 07/25/20 0422 08/01/20 0310 08/01/20 0310 08/02/20 0432 08/03/20 0104 08/12/20 0926  WBC 10.8*   < > 8.2   < > 17.5*  --  14.0* 12.4*  --   NEUTROABS 8.4*  --  5.5  --   --   --   --   --   --   HGB 12.7*   < > 12.8*   < > 8.6*   < > 8.4* 9.0* 14.4  HCT 41.9   < > 42.7   < > 27.5*   < > 27.6* 28.7* 43.3  MCV 94.2   < > 94.5   < > 91.1  --  91.7 90.8  --   PLT 331   < > 295   < > 215  --  268 330  --    < > = values in this interval not displayed.   Lab Results  Component Value Date   TSH 0.941 07/24/2020   Lab Results  Component Value Date   HGBA1C 5.2 07/26/2020   Lab Results  Component Value Date   CHOL 78 07/26/2020   HDL 24 (L) 07/26/2020   LDLCALC 36 07/26/2020   TRIG 92 07/26/2020   CHOLHDL 3.3 07/26/2020    Significant Diagnostic Results in last 30 days:  DG Chest 1 View  Result Date: 07/31/2020 CLINICAL DATA:  Chest tube placement, open heart surgery  EXAM: CHEST  1 VIEW COMPARISON:  None. FINDINGS: Right internal jugular Swan-Ganz catheter with its tip within the distal main pulmonary artery, bilateral chest tubes, mediastinal drain, are unchanged. Tiny right apical pneumothorax is present. Tiny left pleural effusion is noted, decreased since prior examination. Retrocardiac opacification persists likely representing partial left lower lobe collapse. Mild cardiomegaly is unchanged. Median sternotomy has been performed. Probable left atrial clipping has been performed. Pulmonary vascularity is normal. IMPRESSION: Right chest tube in place. Tiny right apical pneumothorax now visualized, possibly related to patient positioning. Left chest tube in place.  Tiny, decreasing left pleural effusion. Persistent partial  left lower lobe collapse. Electronically Signed   By: Helyn Numbers MD   On: 07/31/2020 06:47   DG Chest 2 View  Result Date: 08/11/2020 CLINICAL DATA:  Coronary artery disease. Recent coronary artery bypass grafting EXAM: CHEST - 2 VIEW COMPARISON:  August 05, 2020 FINDINGS: There is a persistent left pleural effusion with left base atelectasis. There are scattered calcified granulomas. Lungs elsewhere clear. There is cardiomegaly with pulmonary vascularity normal. Patient is status post coronary artery bypass grafting with left atrial appendage clamp present. There is aortic atherosclerosis. No adenopathy. No bone lesions. IMPRESSION: Persistent left pleural effusion with left base atelectasis. Scattered calcified granulomas noted. Lungs elsewhere clear. Stable cardiac enlargement with postoperative changes. Aortic Atherosclerosis (ICD10-I70.0). Electronically Signed   By: Bretta Bang III M.D.   On: 08/11/2020 16:04   DG Chest 2 View  Result Date: 08/05/2020 CLINICAL DATA:  Pleural effusion EXAM: CHEST - 2 VIEW COMPARISON:  August 01, 2020 FINDINGS: There is a stable small left pleural effusion with atelectatic change in the left base. There are scattered small granulomas, stable. Heart is enlarged, stable, with evidence of previous coronary artery bypass grafting. Left atrial appendage clamp present. No adenopathy. No pneumothorax. There is degenerative change in the thoracic spine. IMPRESSION: Stable small left pleural effusion with left base atelectasis. No new opacity. There are scattered granulomas. Stable cardiac prominence with postoperative changes. No adenopathy evident. Electronically Signed   By: Bretta Bang III M.D.   On: 08/05/2020 08:15   DG Abd 1 View  Result Date: 07/24/2020 CLINICAL DATA:  Diarrhea for 5 days EXAM: ABDOMEN - 1 VIEW COMPARISON:  Nine hundred eighteen FINDINGS: 2 supine frontal views of the abdomen and pelvis are obtained. Bowel gas pattern is  unremarkable without obstruction or ileus. No masses or abnormal calcifications. The lung bases are clear. IMPRESSION: 1. Unremarkable bowel gas pattern. Electronically Signed   By: Sharlet Salina M.D.   On: 07/24/2020 01:25   CARDIAC CATHETERIZATION  Result Date: 07/25/2020  LV end diastolic pressure is normal.  Ramus lesion is 95% stenosed.  Ost RCA to Prox RCA lesion is 80% stenosed.  Ost Cx to Prox Cx lesion is 95% stenosed.  Mid LM to Dist LM lesion is 40% stenosed.  Mid LAD-1 lesion is 80% stenosed.  Mid LAD-2 lesion is 90% stenosed.  1st Diag lesion is 80% stenosed.  Left Heart Catheterization 07/25/20: RCA: Proximal RCA 80% stenosis, large vessel with mild disease in PL and PDA branches.  A secondary PL branch is subtotally occluded. LM: Distal 30-40% stenosis. LAD: LAD diffusely diseased, proximal diffuse 80% followed by tandem 90% stenosis.  Large D1 with moderate diffuse disease and proximal and mid tandem 70 and 80% stenosis.  Has secondary branches and is tortuous. Cx  and RI: Co-dominant Cx with Ostial circumflex 99% involving a moderate sized ramus with ostial 80 to  90% and proximal 90% stenosis. LV: Normal LVEDP.  No pressure gradient across the aortic valve. Patent LIMA and RIMA and RIMA has ostial 20-30% stenosis. Subclavian arteries widely patent. Right radial diffusely disease by [place]. 29mL contrast used. Recommendation: Patient needs evaluation for inpatient CABG in view of NSVT, ventricular standstill.  Radial arterial conduits cannot be utilized as there was significant amount of atherosclerosis evident with ultrasound guidance.  Will discuss with surgery.  We could still consider Maze procedure and left atrial appendage ligation.   DG Chest Port 1 View  Result Date: 08/01/2020 CLINICAL DATA:  Shortness of breath. EXAM: PORTABLE CHEST 1 VIEW COMPARISON:  July 31, 2020. FINDINGS: Stable cardiomegaly. No pneumothorax is noted. Right lung is clear. Mild left basilar  subsegmental atelectasis is noted with small left pleural effusion. Left-sided chest tube has been removed. Bony thorax is unremarkable. IMPRESSION: Mild left basilar subsegmental atelectasis with small left pleural effusion. No pneumothorax status post left-sided chest tube removal. Electronically Signed   By: Lupita Raider M.D.   On: 08/01/2020 08:14   DG Chest Port 1 View  Result Date: 07/30/2020 CLINICAL DATA:  Status post cardiac surgery. EXAM: PORTABLE CHEST 1 VIEW COMPARISON:  July 29, 2020. FINDINGS: Stable cardiomegaly. Endotracheal and nasogastric tubes have been removed. Right internal jugular Swan-Ganz catheter is noted with tip directed toward right pulmonary artery. No pneumothorax is noted. Bilateral chest tubes are noted. Right lung is clear. Mild left basilar atelectasis is noted with small left pleural effusion. Bony thorax is unremarkable. IMPRESSION: Bilateral chest tubes are noted without pneumothorax. Mild left basilar atelectasis is noted with small left pleural effusion. Endotracheal and nasogastric tubes have been removed. Electronically Signed   By: Lupita Raider M.D.   On: 07/30/2020 08:11   DG Chest Port 1 View  Result Date: 07/29/2020 CLINICAL DATA:  Post CABG x4. EXAM: PORTABLE CHEST 1 VIEW COMPARISON:  07/28/2020 FINDINGS: Endotracheal tube has tip approximately 4.2 cm above the carina. Enteric tube courses into the region of the stomach and off the film as tip is not visualized. Right IJ Swan-Ganz catheter has tip over the main pulmonary artery segment. Bibasilar chest tubes are present. Catheter tip overlies the left heart Lungs are adequately inflated with minimal hazy prominence of the perihilar markings suggesting mild degree of vascular congestion. Mild linear atelectasis over the right midlung. No significant effusion or pneumothorax. Mild cardiomegaly. Remainder of the exam is unchanged. IMPRESSION: 1. Mild cardiomegaly with suggestion of mild vascular  congestion. 2. Multiple tubes and lines as described. Electronically Signed   By: Elberta Fortis M.D.   On: 07/29/2020 14:50   DG CHEST PORT 1 VIEW  Result Date: 07/28/2020 CLINICAL DATA:  Preop for heart surgery. EXAM: PORTABLE CHEST 1 VIEW COMPARISON:  Chest x-ray 07/02/2020 FINDINGS: The heart size and mediastinal contours are unchanged. Aortic arch calcification. Well-defined stable 7 mm right lower lung zone pulmonary nodule consistent with known basilar granuloma. No focal consolidation. No pulmonary edema. No pleural effusion. No pneumothorax. No acute osseous abnormality. Multilevel degenerative changes of the spine. IMPRESSION: No active cardiopulmonary disease. Electronically Signed   By: Tish Frederickson M.D.   On: 07/28/2020 21:06   DG Abd Portable 1V  Result Date: 08/01/2020 CLINICAL DATA:  Ileus.  History of constipation. EXAM: PORTABLE ABDOMEN - 1 VIEW COMPARISON:  07/24/2020. FINDINGS: Mildly distended loops of small and large bowel. Mild adynamic ileus cannot be excluded. No large stool volume. No free air identified. Degenerative change lumbar spine. IMPRESSION:  Mildly distended loops of small large bowel. Mild adynamic ileus cannot be excluded. No large stool volume. Electronically Signed   By: Maisie Fus  Register   On: 08/01/2020 08:54   ECHOCARDIOGRAM COMPLETE  Result Date: 07/24/2020    ECHOCARDIOGRAM REPORT   Patient Name:   [name] Rice Date of Exam: 07/24/2020 Medical Rec #:  [id number]      Height:       73.0 in Accession #:    [id number]     Weight:       250.0 lb Date of Birth:  [birth year]      BSA:          2.366 m Patient Age:    69 years       BP:           128/87 mmHg Patient Gender: M              HR:           72 bpm. Exam Location:  Inpatient Procedure: 2D Echo Indications:    780.2 syncope  History:        Patient has prior history of Echocardiogram examinations, most                 recent 02/20/2020. Arrythmias:Atrial Fibrillation; Risk                  Factors:Hypertension. LBBB.  Sonographer:    Celene Skeen RDCS (AE) Referring Phys: [id number] ANASTASSIA DOUTOVA  Sonographer Comments: Image acquisition challenging due to patient body habitus and Image acquisition challenging due to respiratory motion. IMPRESSIONS  1. Poor acoustic windows limt study. Overall LVEF is normal ENdocardium is difficult to see, especially at apex. Consider limited echo with Definity to confirm wall motion. . Left ventricular ejection fraction, by estimation, is 55 to 60%. The left ventricle has normal function. Left ventricular endocardial border not optimally defined to evaluate regional wall motion. There is severe asymmetric left ventricular hypertrophy. Left ventricular diastolic parameters are indeterminate.  2. Right ventricular systolic function is normal. The right ventricular size is mildly enlarged.  3. The mitral valve is abnormal. Mild mitral valve regurgitation.  4. The aortic valve is abnormal. Aortic valve regurgitation is not visualized. Mild aortic valve sclerosis is present, with no evidence of aortic valve stenosis.  5. The inferior vena cava is normal in size with greater than 50% respiratory variability, suggesting right atrial pressure of 3 mmHg. FINDINGS  Left Ventricle: Poor acoustic windows limt study. Overall LVEF is normal ENdocardium is difficult to see, especially at apex. Consider limited echo with Definity to confirm wall motion. Left ventricular ejection fraction, by estimation, is 55 to 60%. The left ventricle has normal function. Left ventricular endocardial border not optimally defined to evaluate regional wall motion. The left ventricular internal cavity size was normal in size. There is severe asymmetric left ventricular hypertrophy. Left ventricular diastolic parameters are indeterminate. Right Ventricle: The right ventricular size is mildly enlarged. Right vetricular wall thickness was not assessed. Right ventricular systolic function is normal. Left  Atrium: Left atrial size was normal in size. Right Atrium: Right atrial size was normal in size. Pericardium: There is no evidence of pericardial effusion. Mitral Valve: The mitral valve is abnormal. There is mild thickening of the mitral valve leaflet(s). Mild to moderate mitral annular calcification. Mild mitral valve regurgitation. Tricuspid Valve: The tricuspid valve is not well visualized. Tricuspid valve regurgitation is mild. Aortic Valve: The aortic valve is abnormal. Aortic valve regurgitation  is not visualized. Mild aortic valve sclerosis is present, with no evidence of aortic valve stenosis. Aortic valve mean gradient measures 8.0 mmHg. Aortic valve peak gradient measures  13.5 mmHg. Aortic valve area, by VTI measures 2.27 cm. Pulmonic Valve: The pulmonic valve was not well visualized. Pulmonic valve regurgitation is not visualized. No evidence of pulmonic stenosis. Aorta: The aortic root is normal in size and structure. Venous: The inferior vena cava is normal in size with greater than 50% respiratory variability, suggesting right atrial pressure of 3 mmHg. IAS/Shunts: The interatrial septum was not assessed.  LEFT VENTRICLE PLAX 2D LVIDd:         4.02 cm LVIDs:         3.08 cm LV PW:         1.33 cm LV IVS:        13.00 cm LVOT diam:     2.20 cm LV SV:         75 LV SV Index:   32 LVOT Area:     3.80 cm  RIGHT VENTRICLE TAPSE (M-mode): 1.3 cm LEFT ATRIUM             Index       RIGHT ATRIUM           Index LA diam:        3.50 cm 1.48 cm/m  RA Area:     18.30 cm LA Vol (A2C):   66.1 ml 27.94 ml/m RA Volume:   43.80 ml  18.51 ml/m LA Vol (A4C):   33.8 ml 14.29 ml/m LA Biplane Vol: 49.3 ml 20.84 ml/m  AORTIC VALVE AV Area (Vmax):    2.15 cm AV Area (Vmean):   2.37 cm AV Area (VTI):     2.27 cm AV Vmax:           184.00 cm/s AV Vmean:          130.000 cm/s AV VTI:            0.332 m AV Peak Grad:      13.5 mmHg AV Mean Grad:      8.0 mmHg LVOT Vmax:         104.00 cm/s LVOT Vmean:        81.200  cm/s LVOT VTI:          0.198 m LVOT/AV VTI ratio: 0.60  AORTA Ao Root diam: 3.10 cm MITRAL VALVE MV Area (PHT): 2.99 cm    SHUNTS MV Decel Time: 254 msec    Systemic VTI:  0.20 m MV E velocity: 91.30 cm/s  Systemic Diam: 2.20 cm Dietrich Pates MD Electronically signed by Dietrich Pates MD Signature Date/Time: 07/24/2020/2:33:10 PM    Final    ECHO INTRAOPERATIVE TEE  Result Date: 07/30/2020  *INTRAOPERATIVE TRANSESOPHAGEAL REPORT *  Patient Name:   [name] Rice Date of Exam: 07/29/2020 Medical Rec #:  [id number]      Height:       73.0 in Accession #:    [id number]     Weight:       256.9 lb Date of Birth:  Aug 08, 1951      BSA:          2.39 m Patient Age:    69 years       BP:           123/79 mmHg Patient Gender: M              HR:  62 bpm. Exam Location:  Anesthesiology Transesophogeal exam was perform intraoperatively during surgical procedure. Patient was closely monitored under general anesthesia during the entirety of examination. Indications:     CAD Native Vessel Sonographer:     Thurman Coyer RDCS (AE) Performing Phys: [id number] Ephriam Knuckles Z ATKINS Diagnosing Phys: Achille Rich MD Complications: No known complications during this procedure. POST-OP IMPRESSIONS - Left Ventricle: The left ventricle is unchanged from pre-bypass. - Right Ventricle: The right ventricle appears unchanged from pre-bypass. - Aorta: The aorta appears unchanged from pre-bypass. - Left Atrium: The left atrium appears unchanged from pre-bypass. - Left Atrial Appendage: Has been surgically closed and the repair appears to be oversewn. - Aortic Valve: The aortic valve appears unchanged from pre-bypass. - Mitral Valve: The mitral valve appears unchanged from pre-bypass. - Tricuspid Valve: The tricuspid valve appears unchanged from pre-bypass. - Interatrial Septum: The interatrial septum appears unchanged from pre-bypass. - Interventricular Septum: The interventricular septum appears unchanged from pre-bypass. - Pericardium: The  pericardium appears unchanged from pre-bypass. PRE-OP FINDINGS  Left Ventricle: The left ventricle has mildly reduced systolic function, with an ejection fraction of 45-50%. The cavity size was normal. There is mildly increased left ventricular wall thickness. No evidence of left ventricular regional wall motion abnormalities. Right Ventricle: The right ventricle has normal systolic function. The cavity was normal. There is no increase in right ventricular wall thickness. Left Atrium: Left atrial size was normal in size. The left atrial appendage is well visualized and there is no evidence of thrombus present. Right Atrium: Right atrial size was normal in size. Interatrial Septum: No atrial level shunt detected by color flow Doppler. Pericardium: There is no evidence of pericardial effusion. Mitral Valve: The mitral valve is normal in structure. Mitral valve regurgitation is mild by color flow Doppler. There is No evidence of mitral stenosis. Tricuspid Valve: The tricuspid valve was normal in structure. Tricuspid valve regurgitation is trivial by color flow Doppler. Aortic Valve: The aortic valve is tricuspid There is mild thickening of the aortic valve Aortic valve regurgitation was not visualized by color flow Doppler. There is no evidence of aortic valve stenosis. Right coronary cusp excursion is reduced. Pulmonic Valve: The pulmonic valve was normal in structure. Pulmonic valve regurgitation is not visualized by color flow Doppler. Aorta: The aortic root, ascending aorta and aortic arch are normal in size and structure.  Achille Rich MD Electronically signed by Achille Rich MD Signature Date/Time: 07/30/2020/6:58:54 PM    Final    VAS US DOPPLER PRE CABG  Result Date: 07/28/2020 PREOPERATIVE VASCULAR EVALUATION  Indications:      Pre-CABG. Risk Factors:     Hypertension. Comparison Study: No prior studies. Performing Technologist: Olen Cordial RVT  Examination Guidelines: A complete evaluation includes  B-mode imaging, spectral Doppler, color Doppler, and power Doppler as needed of all accessible portions of each vessel. Bilateral testing is considered an integral part of a complete examination. Limited examinations for reoccurring indications may be performed as noted.  Right Carotid Findings: +----------+--------+--------+--------+-----------------------+--------+           PSV cm/sEDV cm/sStenosisDescribe               Comments +----------+--------+--------+--------+-----------------------+--------+ CCA Prox  143     15              smooth and heterogenous         +----------+--------+--------+--------+-----------------------+--------+ CCA Distal66      10  smooth and heterogenous         +----------+--------+--------+--------+-----------------------+--------+ ICA Prox  50      19              calcific                        +----------+--------+--------+--------+-----------------------+--------+ ICA Distal96      31                                     tortuous +----------+--------+--------+--------+-----------------------+--------+ ECA       128     15                                              +----------+--------+--------+--------+-----------------------+--------+ Portions of this table do not appear on this page. +----------+--------+-------+--------+------------+           PSV cm/sEDV cmsDescribeArm Pressure +----------+--------+-------+--------+------------+ Subclavian101                                 +----------+--------+-------+--------+------------+ +---------+--------+---+--------+--+---------+ VertebralPSV cm/s156EDV cm/s34Antegrade +---------+--------+---+--------+--+---------+ Left Carotid Findings: +----------+--------+--------+--------+-----------------------+--------+           PSV cm/sEDV cm/sStenosisDescribe               Comments +----------+--------+--------+--------+-----------------------+--------+ CCA Prox   79      18              smooth and heterogenous         +----------+--------+--------+--------+-----------------------+--------+ CCA Distal53      10              smooth and heterogenous         +----------+--------+--------+--------+-----------------------+--------+ ICA Prox  237     77      60-79%  calcific                        +----------+--------+--------+--------+-----------------------+--------+ ICA Mid   199     37              smooth and heterogenous         +----------+--------+--------+--------+-----------------------+--------+ ICA Distal97      17                                     tortuous +----------+--------+--------+--------+-----------------------+--------+ ECA       99      11                                              +----------+--------+--------+--------+-----------------------+--------+ +----------+--------+--------+--------+------------+ SubclavianPSV cm/sEDV cm/sDescribeArm Pressure +----------+--------+--------+--------+------------+           128                                  +----------+--------+--------+--------+------------+ +---------+--------+--+--------+--+---------+ VertebralPSV cm/s53EDV cm/s24Antegrade +---------+--------+--+--------+--+---------+  ABI Findings: +--------+------------------+-----+---------+--------+ Right   Rt Pressure (mmHg)IndexWaveform Comment  +--------+------------------+-----+---------+--------+ [id number]  triphasic         +--------+------------------+-----+---------+--------+ PTA     172               1.10 biphasic          +--------+------------------+-----+---------+--------+ DP      145               0.93 biphasic          +--------+------------------+-----+---------+--------+ +--------+------------------+-----+---------+-------+ Left    Lt Pressure (mmHg)IndexWaveform Comment +--------+------------------+-----+---------+-------+  Brachial150                    triphasic        +--------+------------------+-----+---------+-------+ PTA     250               1.60 triphasic        +--------+------------------+-----+---------+-------+ DP      161               1.03 biphasic         +--------+------------------+-----+---------+-------+ +-------+---------------+----------------+ ABI/TBIToday's ABI/TBIPrevious ABI/TBI +-------+---------------+----------------+ Right  1.1                             +-------+---------------+----------------+ Left   1.6                             +-------+---------------+----------------+  Right Doppler Findings: +--------+--------+-----+---------+--------+ Site    PressureIndexDoppler  Comments +--------+--------+-----+---------+--------+ [id number]          triphasic         +--------+--------+-----+---------+--------+ Radial               triphasic         +--------+--------+-----+---------+--------+ Ulnar                triphasic         +--------+--------+-----+---------+--------+  Left Doppler Findings: +--------+--------+-----+---------+--------+ Site    PressureIndexDoppler  Comments +--------+--------+-----+---------+--------+ Brachial150          triphasic         +--------+--------+-----+---------+--------+ Radial               triphasic         +--------+--------+-----+---------+--------+ Ulnar                triphasic         +--------+--------+-----+---------+--------+  Summary: Right Carotid: Velocities in the right ICA are consistent with a 1-39% stenosis. Left Carotid: Velocities in the left ICA are consistent with a 60-79% stenosis. Vertebrals: Bilateral vertebral arteries demonstrate antegrade flow. Right ABI: Resting right ankle-brachial index is within normal range. No evidence of significant right lower extremity arterial disease. Left ABI: Resting left ankle-brachial index indicates noncompressible left lower  extremity arteries. Right Upper Extremity: Doppler waveform obliterate with right radial compression. Doppler waveform obliterate with right ulnar compression. Left Upper Extremity: Doppler waveform obliterate with left radial compression. Doppler waveform obliterate with left ulnar compression.  Electronically signed by Fabienne Bruns MD on 07/28/2020 at 5:18:01 PM.    Final     Assessment/Plan  1. Acute blood loss anemia Lab Results  Component Value Date   HGB 14.4 08/12/2020   -  Improved from hgb 9.0  2. 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 Permanent  atrial fibrillation (HCC) -Rate controlled, continue metoprolol tartrate and amiodarone for rate control and Xarelto for anticoagulation     Family/ staff Communication:  Discussed plan of care with resident and charge nurse.  Labs/tests ordered:  None  Goals of care:   Short-term care   Kenard Gower, DNP, MSN, FNP-BC [facility] [phone] (Monday-Friday 8:00 a.m. - 5:00 p.m.) [phone] (after hours)

## 2020-08-15 ENCOUNTER — Other Ambulatory Visit (HOSPITAL_COMMUNITY)
Admission: RE | Admit: 2020-08-15 | Discharge: 2020-08-15 | Disposition: A | Discharge status: Home / Self Care | Payer: Medicare Other | Source: Ambulatory Visit | Attending: Cardiothoracic Surgery | Admitting: Cardiothoracic Surgery

## 2020-08-15 ENCOUNTER — Non-Acute Institutional Stay (SKILLED_NURSING_FACILITY): Payer: Medicare Other | Admitting: Adult Health

## 2020-08-15 ENCOUNTER — Encounter: Payer: Self-pay | Admitting: Adult Health

## 2020-08-15 DIAGNOSIS — I4821 Permanent atrial fibrillation: Secondary | ICD-10-CM | POA: Diagnosis not present

## 2020-08-15 DIAGNOSIS — Z20822 Contact with and (suspected) exposure to covid-19: Secondary | ICD-10-CM | POA: Diagnosis not present

## 2020-08-15 DIAGNOSIS — I2510 Atherosclerotic heart disease of native coronary artery without angina pectoris: Secondary | ICD-10-CM

## 2020-08-15 DIAGNOSIS — R5381 Other malaise: Secondary | ICD-10-CM

## 2020-08-15 DIAGNOSIS — D62 Acute posthemorrhagic anemia: Secondary | ICD-10-CM | POA: Diagnosis not present

## 2020-08-15 DIAGNOSIS — E876 Hypokalemia: Secondary | ICD-10-CM

## 2020-08-15 DIAGNOSIS — Z01812 Encounter for preprocedural laboratory examination: Secondary | ICD-10-CM | POA: Insufficient documentation

## 2020-08-15 DIAGNOSIS — I119 Hypertensive heart disease without heart failure: Secondary | ICD-10-CM

## 2020-08-15 LAB — SARS CORONAVIRUS 2 (TAT 6-24 HRS): SARS Coronavirus 2: NEGATIVE

## 2020-08-15 NOTE — Progress Notes (Signed)
Location:  [facility] Room Number: 112-A Place of Service:  SNF (31) Provider:  Kenard Gower, DNP, FNP-BC  Patient Care Team: Gala Lewandowsky as PCP - [facility])  Extended Emergency Contact Information Primary Emergency Contact: Juma, Oxley Mobile Phone: [phone] Relation: Brother Secondary Emergency Contact: Murle, Ot[place] Mobile Phone: [phone] Relation: Aunt  Code Status:  FULL CODE  Goals of care: Advanced Directive information Advanced Directives 08/13/2020  Does Patient Have a Medical Advance Directive? No  Type of Advance Directive -  Does patient want to make changes to medical advance directive? -  Would patient like information on creating a medical advance directive? No - Patient declined     Chief Complaint  Patient presents with   Discharge Note    Patient seen for discharge home on 08/16/20    HPI:  Pt is a 70 y.o. male seen today for discharge home with Home health PT, OT and Nurse for wound treatment.  He was admitted to [facility] on 08/05/20 post [facility] hospitalization 07/23/2020 to 08/05/2020.  He has a PMH of atrial fibrillation S/P attempted cardioversion.  Three weeks prior to hospitalization, he was admitted to the hospital due to dizziness.  He had a Holter monitor placed and was discharged home.  He had another episode of not feeling well.  He was admitted to the hospital and had echocardiogram which showed reduced ejection fraction and a nuclear stress test which suggested perfusion defect.  Left heart catheterization showed multivessel coronary artery disease.  Cardiothoracic surgery was consulted for CABG and atrial fibrillation.  He underwent CABG x4 on 07/29/2020.  He was a started on Isordil for his radial artery graft.  Follow-up chest x-ray showed a small left pleural effusion/atelectasis but no evidence of pneumothorax.  He was a started on oral amiodarone for his  history of atrial fibrillation and post maze procedure.  His home Xarelto was then restarted.  Aggressive diuretic therapy with Lasix IV BID was done.  As a result, he developed an elevation in his creatinine level with a peak of 1.31 and his regimen was adjusted.  Hospitalization was complicated by nausea and vomiting.  Abdominal film was obtained and showed mild gaseous dilatation of his bowels.  He was treated with Reglan and his diet was advanced very slowly.  His symptoms resolved and his bowels moved without difficulty.  Ileus resolved on 9/26 with several bowel movements.  He was then discharged to SNF for short-term rehabilitation.  He was seen today and denies having any pain. Latest K 4.8 and hgb 14.4.   Patient was admitted to this facility for short-term rehabilitation after the patient's recent hospitalization.  Patient has completed SNF rehabilitation and therapy has cleared the patient for discharge.   Past Medical History:  Diagnosis Date   Atrial fibrillation (HCC)    Dysrhythmia    Hypertension    LBBB (left bundle branch block)    Past Surgical History:  Procedure Laterality Date   CARDIOVERSION N/A 09/28/2016   Procedure: CARDIOVERSION;  Surgeon: Yates Decamp, MD;  Location: [facility] ENDOSCOPY;  Service: Cardiovascular;  Laterality: N/A;   CLIPPING OF ATRIAL APPENDAGE Left 07/29/2020   Procedure: CLIPPING OF ATRIAL APPENDAGE;  Surgeon: Linden Dolin, MD;  Location: [facility] OR;  Service: Open Heart Surgery;  Laterality: Left;   CORONARY ARTERY BYPASS GRAFT N/A 07/29/2020   Procedure: CORONARY ARTERY BYPASS GRAFTING (CABG) TIMES FOUR USING BILATERAL INTERNAL MAMMARIES AND LEFT RADIAL ARTERY;  Surgeon:  Linden DolinAtkins, Broadus Z, MD;  Location: [facility] OR;  Service: Open Heart Surgery;  Laterality: N/A;   LEFT HEART CATH AND CORONARY ANGIOGRAPHY N/A 07/25/2020   Procedure: LEFT HEART CATH AND CORONARY ANGIOGRAPHY;  Surgeon: Yates DecampGanji, Jay, MD;  Location: [facility] INVASIVE CV LAB;  Service: Cardiovascular;   Laterality: N/A;   MAZE N/A 07/29/2020   Procedure: MAZE;  Surgeon: Linden DolinAtkins, Broadus Z, MD;  Location: [facility] OR;  Service: Open Heart Surgery;  Laterality: N/A;   NO PAST SURGERIES     RADIAL ARTERY HARVEST Left 07/29/2020   Procedure: RADIAL ARTERY HARVEST;  Surgeon: Linden DolinAtkins, Broadus Z, MD;  Location: [facility] OR;  Service: Open Heart Surgery;  Laterality: Left;   TEE WITHOUT CARDIOVERSION N/A 07/29/2020   Procedure: TRANSESOPHAGEAL ECHOCARDIOGRAM (TEE);  Surgeon: Linden DolinAtkins, Broadus Z, MD;  [facility];  Service: Open Heart Surgery;  Laterality: N/A;    No Known Allergies  Outpatient Encounter Medications as of 08/15/2020  Medication Sig   acetaminophen (TYLENOL) 325 MG tablet Take 650 mg by mouth every 6 (six) hours as needed for mild pain.   amiodarone (PACERONE) 200 MG tablet Take 1 tablet (200 mg total) by mouth daily.   aspirin EC 81 MG EC tablet Take 1 tablet (81 mg total) by mouth daily. Swallow whole.   Ensure (ENSURE) Take 237 mLs by mouth daily. Vanilla if available   ferrous sulfate 325 (65 FE) MG tablet Take 325 mg by mouth every Monday, Wednesday, and Friday.   guaiFENesin (MUCINEX) 600 MG 12 hr tablet Take 1 tablet (600 mg total) by mouth 2 (two) times daily as needed.   isosorbide dinitrate (ISORDIL) 10 MG tablet Take 1 tablet (10 mg total) by mouth 3 (three) times daily. Take for 3 weeks then stop.   melatonin 5 MG TABS Take 5 mg by mouth at bedtime.   metoprolol tartrate (LOPRESSOR) 25 MG tablet Take 0.5 tablets (12.5 mg total) by mouth 2 (two) times daily.   NON FORMULARY Heart Healthy chopped/thin diet consistency   oxycodone (OXY-IR) 5 MG capsule Take 5 mg by mouth every 8 (eight) hours.   potassium chloride SA (KLOR-CON) 20 MEQ tablet Take 1 tablet (20 mEq total) by mouth daily.   rosuvastatin (CRESTOR) 40 MG tablet Take 1 tablet (40 mg total) by mouth daily.   XARELTO 20 MG TABS tablet TAKE 1 TABLET BY MOUTH IN  THE EVENING AFTER DINNER   [DISCONTINUED]  oxyCODONE (OXY IR/ROXICODONE) 5 MG immediate release tablet Take 1 tablet (5 mg total) by mouth every 4 (four) hours as needed for severe pain.   No facility-administered encounter medications on file as of 08/15/2020.    Review of Systems  GENERAL: No change in appetite, no fatigue, no weight changes, no fever, chills or weakness MOUTH and THROAT: Denies oral discomfort, gingival pain or bleeding RESPIRATORY: no cough, SOB, DOE, wheezing, hemoptysis CARDIAC: No chest pain or palpitations GI: No abdominal pain, diarrhea, constipation, heart burn, nausea or vomiting GU: Denies dysuria, frequency, hematuria, incontinence, or discharge NEUROLOGICAL: Denies dizziness, syncope, numbness, or headache PSYCHIATRIC: Denies feelings of depression or anxiety. No report of hallucinations, insomnia, paranoia, or agitation   Immunization History  Administered Date(s) Administered   Influenza, High Dose Seasonal PF 07/27/2019, 08/16/2019   Influenza, Seasonal, Injecte, Preservative Fre 08/24/2014   Influenza,inj,Quad PF,6+ Mos 08/05/2016   Influenza-Unspecified 08/24/2014   PFIZER SARS-COV-2 Vaccination 12/13/2019, 01/04/2020   Tdap 02/02/2015   Pertinent  Health Maintenance Due  Topic Date Due   COLONOSCOPY  Never  done   PNA vac Low Risk Adult (1 of 2 - PCV13) Never done   INFLUENZA VACCINE  06/08/2020   No flowsheet data found.   Vitals:   08/15/20 1219  BP: 133/74  Pulse: 64  Resp: 19  Temp: (!) 97.5 F (36.4 C)  TempSrc: Oral  Weight: 256 lb 6.4 oz (116.3 kg)  Height: 6\' 1"  (1.854 m)   Body mass index is 33.83 kg/m.  Physical Exam  GENERAL APPEARANCE: Well nourished. In no acute distress. Obese SKIN: chest midline and left anterior forearm surgical incision with steri-strips, dry and no redness MOUTH and THROAT: Lips are without lesions. Oral mucosa is moist and without lesions. Tongue is normal in shape, size, and color and without lesions RESPIRATORY: Breathing is  even & unlabored, BS CTAB CARDIAC: RRR, no murmur,no extra heart sounds GI: Abdomen soft, normal BS, no masses, no tenderness EXTREMITIES:  Able to move X 4 extremities NEUROLOGICAL: There is no tremor. Speech is clear. Alert and oriented X 3. PSYCHIATRIC:  Affect and behavior are appropriate  Labs reviewed: Recent Labs    07/24/20 0210 07/24/20 0748 07/24/20 1122 07/25/20 0422 07/30/20 0403 07/30/20 0403 07/30/20 1707 07/31/20 0440 08/03/20 0104 08/05/20 0132 08/12/20 0926  NA 137   < >  --    < > 138   < > 135   < > 134* 133* 140  K 3.5   < >  --    < > 4.4   < > 4.4   < > 3.4* 3.7 4.8  CL 108   < >  --    < > 107   < > 105   < > 98 96* 106  CO2 18*   < >  --    < > 22   < > 19*   < > 25 28 21   GLUCOSE 104*   < >  --    < > 143*   < > 174*   < > 105* 107* 97  BUN 17   < >  --    < > 7*   < > 8   < > 23 15 18   CREATININE 1.74*   < >  --    < > 1.07   < > 1.17   < > 1.31* 1.24 0.94  CALCIUM 8.3*   < >  --    < > 8.4*   < > 8.3*   < > 8.3* 8.1* 9.3  MG 1.6*  --  1.7   < > 2.6*  --  2.2  --   --   --  2.1  PHOS 2.8  --  2.8  --   --   --   --   --   --   --   --    < > = values in this interval not displayed.   Recent Labs    07/24/20 0748 07/26/20 0515 07/27/20 0201  AST 34 22 21  ALT 32 21 21  ALKPHOS 163* 120 105  BILITOT 1.9* 1.7* 1.4*  PROT 6.0* 6.0* 5.7*  ALBUMIN 2.7* 2.5* 2.4*   Recent Labs    07/02/20 1604 07/23/20 1221 07/24/20 1122 07/25/20 0422 08/01/20 0310 08/01/20 0310 08/02/20 0432 08/03/20 0104 08/12/20 0926  WBC 10.8*   < > 8.2   < > 17.5*  --  14.0* 12.4*  --   NEUTROABS 8.4*  --  5.5  --   --   --   --   --   --  HGB 12.7*   < > 12.8*   < > 8.6*   < > 8.4* 9.0* 14.4  HCT 41.9   < > 42.7   < > 27.5*   < > 27.6* 28.7* 43.3  MCV 94.2   < > 94.5   < > 91.1  --  91.7 90.8  --   PLT 331   < > 295   < > 215  --  268 330  --    < > = values in this interval not displayed.   Lab Results  Component Value Date   TSH 0.941 07/24/2020   Lab  Results  Component Value Date   HGBA1C 5.2 07/26/2020   Lab Results  Component Value Date   CHOL 78 07/26/2020   HDL 24 (L) 07/26/2020   LDLCALC 36 07/26/2020   TRIG 92 07/26/2020   CHOLHDL 3.3 07/26/2020    Significant Diagnostic Results in last 30 days:  DG Chest 1 View  Result Date: 07/31/2020 CLINICAL DATA:  Chest tube placement, open heart surgery EXAM: CHEST  1 VIEW COMPARISON:  None. FINDINGS: Right internal jugular Swan-Ganz catheter with its tip within the distal main pulmonary artery, bilateral chest tubes, mediastinal drain, are unchanged. Tiny right apical pneumothorax is present. Tiny left pleural effusion is noted, decreased since prior examination. Retrocardiac opacification persists likely representing partial left lower lobe collapse. Mild cardiomegaly is unchanged. Median sternotomy has been performed. Probable left atrial clipping has been performed. Pulmonary vascularity is normal. IMPRESSION: Right chest tube in place. Tiny right apical pneumothorax now visualized, possibly related to patient positioning. Left chest tube in place.  Tiny, decreasing left pleural effusion. Persistent partial left lower lobe collapse. Electronically Signed   By: Helyn Numbers MD   On: 07/31/2020 06:47   DG Chest 2 View  Result Date: 08/11/2020 CLINICAL DATA:  Coronary artery disease. Recent coronary artery bypass grafting EXAM: CHEST - 2 VIEW COMPARISON:  August 05, 2020 FINDINGS: There is a persistent left pleural effusion with left base atelectasis. There are scattered calcified granulomas. Lungs elsewhere clear. There is cardiomegaly with pulmonary vascularity normal. Patient is status post coronary artery bypass grafting with left atrial appendage clamp present. There is aortic atherosclerosis. No adenopathy. No bone lesions. IMPRESSION: Persistent left pleural effusion with left base atelectasis. Scattered calcified granulomas noted. Lungs elsewhere clear. Stable cardiac enlargement  with postoperative changes. Aortic Atherosclerosis (ICD10-I70.0). Electronically Signed   By: Bretta Bang III M.D.   On: 08/11/2020 16:04   DG Chest 2 View  Result Date: 08/05/2020 CLINICAL DATA:  Pleural effusion EXAM: CHEST - 2 VIEW COMPARISON:  August 01, 2020 FINDINGS: There is a stable small left pleural effusion with atelectatic change in the left base. There are scattered small granulomas, stable. Heart is enlarged, stable, with evidence of previous coronary artery bypass grafting. Left atrial appendage clamp present. No adenopathy. No pneumothorax. There is degenerative change in the thoracic spine. IMPRESSION: Stable small left pleural effusion with left base atelectasis. No new opacity. There are scattered granulomas. Stable cardiac prominence with postoperative changes. No adenopathy evident. Electronically Signed   By: Bretta Bang III M.D.   On: 08/05/2020 08:15   DG Abd 1 View  Result Date: 07/24/2020 CLINICAL DATA:  Diarrhea for 5 days EXAM: ABDOMEN - 1 VIEW COMPARISON:  Nine hundred eighteen FINDINGS: 2 supine frontal views of the abdomen and pelvis are obtained. Bowel gas pattern is unremarkable without obstruction or ileus. No masses or abnormal calcifications. The lung bases  are clear. IMPRESSION: 1. Unremarkable bowel gas pattern. Electronically Signed   By: Sharlet Salina M.D.   On: 07/24/2020 01:25   CARDIAC CATHETERIZATION  Result Date: 07/25/2020  LV end diastolic pressure is normal.  Ramus lesion is 95% stenosed.  Ost RCA to Prox RCA lesion is 80% stenosed.  Ost Cx to Prox Cx lesion is 95% stenosed.  Mid LM to Dist LM lesion is 40% stenosed.  Mid LAD-1 lesion is 80% stenosed.  Mid LAD-2 lesion is 90% stenosed.  1st Diag lesion is 80% stenosed.  Left Heart Catheterization 07/25/20: RCA: Proximal RCA 80% stenosis, large vessel with mild disease in PL and PDA branches.  A secondary PL branch is subtotally occluded. LM: Distal 30-40% stenosis. LAD: LAD diffusely  diseased, proximal diffuse 80% followed by tandem 90% stenosis.  Large D1 with moderate diffuse disease and proximal and mid tandem 70 and 80% stenosis.  Has secondary branches and is tortuous. Cx  and RI: Co-dominant Cx with Ostial circumflex 99% involving a moderate sized ramus with ostial 80 to 90% and proximal 90% stenosis. LV: Normal LVEDP.  No pressure gradient across the aortic valve. Patent LIMA and RIMA and RIMA has ostial 20-30% stenosis. Subclavian arteries widely patent. Right radial diffusely disease by [place]. 65mL contrast used. Recommendation: Patient needs evaluation for inpatient CABG in view of NSVT, ventricular standstill.  Radial arterial conduits cannot be utilized as there was significant amount of atherosclerosis evident with ultrasound guidance.  Will discuss with surgery.  We could still consider Maze procedure and left atrial appendage ligation.   DG Chest Port 1 View  Result Date: 08/01/2020 CLINICAL DATA:  Shortness of breath. EXAM: PORTABLE CHEST 1 VIEW COMPARISON:  July 31, 2020. FINDINGS: Stable cardiomegaly. No pneumothorax is noted. Right lung is clear. Mild left basilar subsegmental atelectasis is noted with small left pleural effusion. Left-sided chest tube has been removed. Bony thorax is unremarkable. IMPRESSION: Mild left basilar subsegmental atelectasis with small left pleural effusion. No pneumothorax status post left-sided chest tube removal. Electronically Signed   By: Lupita Raider M.D.   On: 08/01/2020 08:14   DG Chest Port 1 View  Result Date: 07/30/2020 CLINICAL DATA:  Status post cardiac surgery. EXAM: PORTABLE CHEST 1 VIEW COMPARISON:  July 29, 2020. FINDINGS: Stable cardiomegaly. Endotracheal and nasogastric tubes have been removed. Right internal jugular Swan-Ganz catheter is noted with tip directed toward right pulmonary artery. No pneumothorax is noted. Bilateral chest tubes are noted. Right lung is clear. Mild left basilar atelectasis is noted  with small left pleural effusion. Bony thorax is unremarkable. IMPRESSION: Bilateral chest tubes are noted without pneumothorax. Mild left basilar atelectasis is noted with small left pleural effusion. Endotracheal and nasogastric tubes have been removed. Electronically Signed   By: Lupita Raider M.D.   On: 07/30/2020 08:11   DG Chest Port 1 View  Result Date: 07/29/2020 CLINICAL DATA:  Post CABG x4. EXAM: PORTABLE CHEST 1 VIEW COMPARISON:  07/28/2020 FINDINGS: Endotracheal tube has tip approximately 4.2 cm above the carina. Enteric tube courses into the region of the stomach and off the film as tip is not visualized. Right IJ Swan-Ganz catheter has tip over the main pulmonary artery segment. Bibasilar chest tubes are present. Catheter tip overlies the left heart Lungs are adequately inflated with minimal hazy prominence of the perihilar markings suggesting mild degree of vascular congestion. Mild linear atelectasis over the right midlung. No significant effusion or pneumothorax. Mild cardiomegaly. Remainder of the exam is unchanged. IMPRESSION:  1. Mild cardiomegaly with suggestion of mild vascular congestion. 2. Multiple tubes and lines as described. Electronically Signed   By: Elberta Fortis M.D.   On: 07/29/2020 14:50   DG CHEST PORT 1 VIEW  Result Date: 07/28/2020 CLINICAL DATA:  Preop for heart surgery. EXAM: PORTABLE CHEST 1 VIEW COMPARISON:  Chest x-ray 07/02/2020 FINDINGS: The heart size and mediastinal contours are unchanged. Aortic arch calcification. Well-defined stable 7 mm right lower lung zone pulmonary nodule consistent with known basilar granuloma. No focal consolidation. No pulmonary edema. No pleural effusion. No pneumothorax. No acute osseous abnormality. Multilevel degenerative changes of the spine. IMPRESSION: No active cardiopulmonary disease. Electronically Signed   By: Tish Frederickson M.D.   On: 07/28/2020 21:06   DG Abd Portable 1V  Result Date: 08/01/2020 CLINICAL DATA:  Ileus.   History of constipation. EXAM: PORTABLE ABDOMEN - 1 VIEW COMPARISON:  07/24/2020. FINDINGS: Mildly distended loops of small and large bowel. Mild adynamic ileus cannot be excluded. No large stool volume. No free air identified. Degenerative change lumbar spine. IMPRESSION: Mildly distended loops of small large bowel. Mild adynamic ileus cannot be excluded. No large stool volume. Electronically Signed   By: Maisie Fus  Register   On: 08/01/2020 08:54   ECHOCARDIOGRAM COMPLETE  Result Date: 07/24/2020    ECHOCARDIOGRAM REPORT   Patient Name:   [name] Rice Date of Exam: 07/24/2020 Medical Rec #:  [id number]      Height:       73.0 in Accession #:    [id number]     Weight:       250.0 lb Date of Birth:  [birth year]      BSA:          2.366 m Patient Age:    69 years       BP:           128/87 mmHg Patient Gender: M              HR:           72 bpm. Exam Location:  Inpatient Procedure: 2D Echo Indications:    780.2 syncope  History:        Patient has prior history of Echocardiogram examinations, most                 recent 02/20/2020. Arrythmias:Atrial Fibrillation; Risk                 Factors:Hypertension. LBBB.  Sonographer:    Celene Skeen RDCS (AE) Referring Phys: [id number] ANASTASSIA DOUTOVA  Sonographer Comments: Image acquisition challenging due to patient body habitus and Image acquisition challenging due to respiratory motion. IMPRESSIONS  1. Poor acoustic windows limt study. Overall LVEF is normal ENdocardium is difficult to see, especially at apex. Consider limited echo with Definity to confirm wall motion. . Left ventricular ejection fraction, by estimation, is 55 to 60%. The left ventricle has normal function. Left ventricular endocardial border not optimally defined to evaluate regional wall motion. There is severe asymmetric left ventricular hypertrophy. Left ventricular diastolic parameters are indeterminate.  2. Right ventricular systolic function is normal. The right ventricular size is mildly enlarged.   3. The mitral valve is abnormal. Mild mitral valve regurgitation.  4. The aortic valve is abnormal. Aortic valve regurgitation is not visualized. Mild aortic valve sclerosis is present, with no evidence of aortic valve stenosis.  5. The inferior vena cava is normal in size with greater than 50% respiratory variability, suggesting right atrial pressure of 3  mmHg. FINDINGS  Left Ventricle: Poor acoustic windows limt study. Overall LVEF is normal ENdocardium is difficult to see, especially at apex. Consider limited echo with Definity to confirm wall motion. Left ventricular ejection fraction, by estimation, is 55 to 60%. The left ventricle has normal function. Left ventricular endocardial border not optimally defined to evaluate regional wall motion. The left ventricular internal cavity size was normal in size. There is severe asymmetric left ventricular hypertrophy. Left ventricular diastolic parameters are indeterminate. Right Ventricle: The right ventricular size is mildly enlarged. Right vetricular wall thickness was not assessed. Right ventricular systolic function is normal. Left Atrium: Left atrial size was normal in size. Right Atrium: Right atrial size was normal in size. Pericardium: There is no evidence of pericardial effusion. Mitral Valve: The mitral valve is abnormal. There is mild thickening of the mitral valve leaflet(s). Mild to moderate mitral annular calcification. Mild mitral valve regurgitation. Tricuspid Valve: The tricuspid valve is not well visualized. Tricuspid valve regurgitation is mild. Aortic Valve: The aortic valve is abnormal. Aortic valve regurgitation is not visualized. Mild aortic valve sclerosis is present, with no evidence of aortic valve stenosis. Aortic valve mean gradient measures 8.0 mmHg. Aortic valve peak gradient measures  13.5 mmHg. Aortic valve area, by VTI measures 2.27 cm. Pulmonic Valve: The pulmonic valve was not well visualized. Pulmonic valve regurgitation is not  visualized. No evidence of pulmonic stenosis. Aorta: The aortic root is normal in size and structure. Venous: The inferior vena cava is normal in size with greater than 50% respiratory variability, suggesting right atrial pressure of 3 mmHg. IAS/Shunts: The interatrial septum was not assessed.  LEFT VENTRICLE PLAX 2D LVIDd:         4.02 cm LVIDs:         3.08 cm LV PW:         1.33 cm LV IVS:        13.00 cm LVOT diam:     2.20 cm LV SV:         75 LV SV Index:   32 LVOT Area:     3.80 cm  RIGHT VENTRICLE TAPSE (M-mode): 1.3 cm LEFT ATRIUM             Index       RIGHT ATRIUM           Index LA diam:        3.50 cm 1.48 cm/m  RA Area:     18.30 cm LA Vol (A2C):   66.1 ml 27.94 ml/m RA Volume:   43.80 ml  18.51 ml/m LA Vol (A4C):   33.8 ml 14.29 ml/m LA Biplane Vol: 49.3 ml 20.84 ml/m  AORTIC VALVE AV Area (Vmax):    2.15 cm AV Area (Vmean):   2.37 cm AV Area (VTI):     2.27 cm AV Vmax:           184.00 cm/s AV Vmean:          130.000 cm/s AV VTI:            0.332 m AV Peak Grad:      13.5 mmHg AV Mean Grad:      8.0 mmHg LVOT Vmax:         104.00 cm/s LVOT Vmean:        81.200 cm/s LVOT VTI:          0.198 m LVOT/AV VTI ratio: 0.60  AORTA Ao Root diam: 3.10 cm MITRAL VALVE MV Area (PHT):  2.99 cm    SHUNTS MV Decel Time: 254 msec    Systemic VTI:  0.20 m MV E velocity: 91.30 cm/s  Systemic Diam: 2.20 cm Dietrich Pates MD Electronically signed by Dietrich Pates MD Signature Date/Time: 07/24/2020/2:33:10 PM    Final    ECHO INTRAOPERATIVE TEE  Result Date: 07/30/2020  *INTRAOPERATIVE TRANSESOPHAGEAL REPORT *  Patient Name:   [name] Rice Date of Exam: 07/29/2020 Medical Rec #:  [id number]      Height:       73.0 in Accession #:    [id number]     Weight:       256.9 lb Date of Birth:  1951-06-30      BSA:          2.39 m Patient Age:    69 years       BP:           123/79 mmHg Patient Gender: M              HR:           62 bpm. Exam Location:  Anesthesiology Transesophogeal exam was perform intraoperatively  during surgical procedure. Patient was closely monitored under general anesthesia during the entirety of examination. Indications:     CAD Native Vessel Sonographer:     Thurman Coyer RDCS (AE) Performing Phys: [id number] Ephriam Knuckles Z ATKINS Diagnosing Phys: Achille Rich MD Complications: No known complications during this procedure. POST-OP IMPRESSIONS - Left Ventricle: The left ventricle is unchanged from pre-bypass. - Right Ventricle: The right ventricle appears unchanged from pre-bypass. - Aorta: The aorta appears unchanged from pre-bypass. - Left Atrium: The left atrium appears unchanged from pre-bypass. - Left Atrial Appendage: Has been surgically closed and the repair appears to be oversewn. - Aortic Valve: The aortic valve appears unchanged from pre-bypass. - Mitral Valve: The mitral valve appears unchanged from pre-bypass. - Tricuspid Valve: The tricuspid valve appears unchanged from pre-bypass. - Interatrial Septum: The interatrial septum appears unchanged from pre-bypass. - Interventricular Septum: The interventricular septum appears unchanged from pre-bypass. - Pericardium: The pericardium appears unchanged from pre-bypass. PRE-OP FINDINGS  Left Ventricle: The left ventricle has mildly reduced systolic function, with an ejection fraction of 45-50%. The cavity size was normal. There is mildly increased left ventricular wall thickness. No evidence of left ventricular regional wall motion abnormalities. Right Ventricle: The right ventricle has normal systolic function. The cavity was normal. There is no increase in right ventricular wall thickness. Left Atrium: Left atrial size was normal in size. The left atrial appendage is well visualized and there is no evidence of thrombus present. Right Atrium: Right atrial size was normal in size. Interatrial Septum: No atrial level shunt detected by color flow Doppler. Pericardium: There is no evidence of pericardial effusion. Mitral Valve: The mitral valve is normal  in structure. Mitral valve regurgitation is mild by color flow Doppler. There is No evidence of mitral stenosis. Tricuspid Valve: The tricuspid valve was normal in structure. Tricuspid valve regurgitation is trivial by color flow Doppler. Aortic Valve: The aortic valve is tricuspid There is mild thickening of the aortic valve Aortic valve regurgitation was not visualized by color flow Doppler. There is no evidence of aortic valve stenosis. Right coronary cusp excursion is reduced. Pulmonic Valve: The pulmonic valve was normal in structure. Pulmonic valve regurgitation is not visualized by color flow Doppler. Aorta: The aortic root, ascending aorta and aortic arch are normal in size and structure.  Achille Rich MD Electronically signed by  Achille Rich MD Signature Date/Time: 07/30/2020/6:58:54 PM    Final    VAS US DOPPLER PRE CABG  Result Date: 07/28/2020 PREOPERATIVE VASCULAR EVALUATION  Indications:      Pre-CABG. Risk Factors:     Hypertension. Comparison Study: No prior studies. Performing Technologist: Olen Cordial RVT  Examination Guidelines: A complete evaluation includes B-mode imaging, spectral Doppler, color Doppler, and power Doppler as needed of all accessible portions of each vessel. Bilateral testing is considered an integral part of a complete examination. Limited examinations for reoccurring indications may be performed as noted.  Right Carotid Findings: +----------+--------+--------+--------+-----------------------+--------+             PSV cm/s EDV cm/s Stenosis Describe                Comments  +----------+--------+--------+--------+-----------------------+--------+  CCA Prox   143      15                smooth and heterogenous           +----------+--------+--------+--------+-----------------------+--------+  CCA Distal 66       10                smooth and heterogenous           +----------+--------+--------+--------+-----------------------+--------+  ICA Prox   50       19                 calcific                          +----------+--------+--------+--------+-----------------------+--------+  ICA Distal 96       31                                        tortuous  +----------+--------+--------+--------+-----------------------+--------+  ECA        128      15                                                  +----------+--------+--------+--------+-----------------------+--------+ Portions of this table do not appear on this page. +----------+--------+-------+--------+------------+             PSV cm/s EDV cms Describe Arm Pressure  +----------+--------+-------+--------+------------+  Subclavian 101                                     +----------+--------+-------+--------+------------+ +---------+--------+---+--------+--+---------+  Vertebral PSV cm/s 156 EDV cm/s 34 Antegrade  +---------+--------+---+--------+--+---------+ Left Carotid Findings: +----------+--------+--------+--------+-----------------------+--------+             PSV cm/s EDV cm/s Stenosis Describe                Comments  +----------+--------+--------+--------+-----------------------+--------+  CCA Prox   79       18                smooth and heterogenous           +----------+--------+--------+--------+-----------------------+--------+  CCA Distal 53       10                smooth and heterogenous           +----------+--------+--------+--------+-----------------------+--------+  ICA Prox   237      77       60-79%   calcific                          +----------+--------+--------+--------+-----------------------+--------+  ICA Mid    199      37                smooth and heterogenous           +----------+--------+--------+--------+-----------------------+--------+  ICA Distal 97       17                                        tortuous  +----------+--------+--------+--------+-----------------------+--------+  ECA        99       11                                                   +----------+--------+--------+--------+-----------------------+--------+ +----------+--------+--------+--------+------------+  Subclavian PSV cm/s EDV cm/s Describe Arm Pressure  +----------+--------+--------+--------+------------+             128                                      +----------+--------+--------+--------+------------+ +---------+--------+--+--------+--+---------+  Vertebral PSV cm/s 53 EDV cm/s 24 Antegrade  +---------+--------+--+--------+--+---------+  ABI Findings: +--------+------------------+-----+---------+--------+  Right    Rt Pressure (mmHg) Index Waveform  Comment   +--------+------------------+-----+---------+--------+  Brachial 156                      triphasic           +--------+------------------+-----+---------+--------+  PTA      172                1.10  biphasic            +--------+------------------+-----+---------+--------+  DP       145                0.93  biphasic            +--------+------------------+-----+---------+--------+ +--------+------------------+-----+---------+-------+  Left     Lt Pressure (mmHg) Index Waveform  Comment  +--------+------------------+-----+---------+-------+  Brachial 150                      triphasic          +--------+------------------+-----+---------+-------+  PTA      250                1.60  triphasic          +--------+------------------+-----+---------+-------+  DP       161                1.03  biphasic           +--------+------------------+-----+---------+-------+ +-------+---------------+----------------+  ABI/TBI Today's ABI/TBI Previous ABI/TBI  +-------+---------------+----------------+  Right   1.1                               +-------+---------------+----------------+  Left    1.6                               +-------+---------------+----------------+  Right Doppler Findings: +--------+--------+-----+---------+--------+  Site     Pressure Index Doppler   Comments  +--------+--------+-----+---------+--------+  Brachial 156             triphasic           +--------+--------+-----+---------+--------+  Radial                  triphasic           +--------+--------+-----+---------+--------+  Ulnar                   triphasic           +--------+--------+-----+---------+--------+  Left Doppler Findings: +--------+--------+-----+---------+--------+  Site     Pressure Index Doppler   Comments  +--------+--------+-----+---------+--------+  Brachial 150            triphasic           +--------+--------+-----+---------+--------+  Radial                  triphasic           +--------+--------+-----+---------+--------+  Ulnar                   triphasic           +--------+--------+-----+---------+--------+  Summary: Right Carotid: Velocities in the right ICA are consistent with a 1-39% stenosis. Left Carotid: Velocities in the left ICA are consistent with a 60-79% stenosis. Vertebrals: Bilateral vertebral arteries demonstrate antegrade flow. Right ABI: Resting right ankle-brachial index is within normal range. No evidence of significant right lower extremity arterial disease. Left ABI: Resting left ankle-brachial index indicates noncompressible left lower extremity arteries. Right Upper Extremity: Doppler waveform obliterate with right radial compression. Doppler waveform obliterate with right ulnar compression. Left Upper Extremity: Doppler waveform obliterate with left radial compression. Doppler waveform obliterate with left ulnar compression.  Electronically signed by Fabienne Bruns MD on 07/28/2020 at 5:18:01 PM.    Final     Assessment/Plan  1. 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  Dispense: 90 tablet; Refill: 0  2. Permanent atrial fibrillation (HCC) -   continue  Xarelto 20 mg one tab in the evening after dinner (patient has medication at home) - amiodarone (PACERONE) 200 MG tablet; Take 1 tablet (200 mg total) by mouth daily.  Dispense: 30 tablet; Refill: 0  3. Acute blood loss anemia Lab Results  Component Value Date   HGB 14.4 08/12/2020   - improved, discontinue FeSO4  4. Hypokalemia Lab Results  Component Value Date   K 4.8 08/12/2020   - improved, discontinue KCL  5.  Physical deconditioning - for home health PT and OT, for therapeutic and strengthening exercises     I have filled out patient's discharge paperwork and e-prescribed medications.  Patient will receive home health PT, OT and Nurse for wound treatment.  Wound treatment: Cleanse left inner arm and sternum surgical wound with NS daily.  Leave open to air  DME provided: [facility]  Total discharge time: Greater than 30 minutes Greater than 50% was spent in counseling and coordination of care.    Discharge time involved coordination of the discharge process with social worker, nursing staff and therapy department. Medical justification for home health services/DME verified.  Durenda Age, DNP, MSN, FNP-BC [facility] [phone] (Monday-Friday 8:00 a.m. - 5:00 p.m.) [phone] (after hours)

## 2020-08-16 MED ORDER — GUAIFENESIN ER 600 MG PO TB12
600.0000 mg | ORAL_TABLET | Freq: Two times a day (BID) | ORAL | 0 refills | Status: DC | PRN
Start: 2020-08-16 — End: 2020-08-26

## 2020-08-16 MED ORDER — ISOSORBIDE DINITRATE 10 MG PO TABS: 10.0000 mg | ORAL_TABLET | Freq: Three times a day (TID) | ORAL | 0 refills | Status: DC

## 2020-08-16 MED ORDER — AMIODARONE HCL 200 MG PO TABS: 200.0000 mg | ORAL_TABLET | Freq: Every day | ORAL | 0 refills | Status: DC

## 2020-08-18 ENCOUNTER — Encounter (HOSPITAL_COMMUNITY): Payer: Self-pay

## 2020-08-18 ENCOUNTER — Ambulatory Visit (HOSPITAL_COMMUNITY)
Admission: RE | Admit: 2020-08-18 | Discharge: 2020-08-18 | Disposition: A | Discharge status: Home / Self Care | Payer: Medicare Other | Source: Ambulatory Visit | Attending: Cardiothoracic Surgery | Admitting: Cardiothoracic Surgery

## 2020-08-18 ENCOUNTER — Other Ambulatory Visit: Payer: Self-pay | Admitting: Cardiothoracic Surgery

## 2020-08-18 ENCOUNTER — Other Ambulatory Visit: Payer: Self-pay

## 2020-08-18 ENCOUNTER — Telehealth: Payer: Self-pay | Admitting: *Deleted

## 2020-08-18 DIAGNOSIS — R5383 Other fatigue: Secondary | ICD-10-CM | POA: Insufficient documentation

## 2020-08-18 DIAGNOSIS — Z951 Presence of aortocoronary bypass graft: Secondary | ICD-10-CM | POA: Diagnosis present

## 2020-08-18 DIAGNOSIS — R531 Weakness: Secondary | ICD-10-CM | POA: Insufficient documentation

## 2020-08-18 DIAGNOSIS — Z538 Procedure and treatment not carried out for other reasons: Secondary | ICD-10-CM | POA: Insufficient documentation

## 2020-08-18 MED ORDER — SODIUM CHLORIDE 0.9 % IV BOLUS
500.0000 mL | Freq: Once | INTRAVENOUS | Status: AC
  Administered 2020-08-18: 500 mL via INTRAVENOUS

## 2020-08-18 MED ORDER — LIDOCAINE HCL 1 % IJ SOLN
INTRAMUSCULAR | Status: AC
  Filled 2020-08-18: qty 20

## 2020-08-18 NOTE — Progress Notes (Signed)
Interventional Radiology Brief Note:  Mr. Nohr arrived to Radiology today feeling poorly.  He appeared weak and fatigued, accompanied by his brother who came in from out of town to get him to this appointment.  Patient states "I feel terrible today.  This is my worst day since heart surgery."  HR 65, BP 82/38 while sitting.  Reports he has been feeling dizzy and lightheaded today. Has not eaten since breakfast.  Limited US Chest performed while patient is sitting and shows fluid amenable to drainage, however patient not able to tolerate today.  When lying down, his BP initially improved, but quickly returned to mid-80s, although he is asymptomatic when recycling.  Review of medications with patient reveals he has been taking twice the prescribed dose of metoprolol.  His BP did not improve with a light snack and bed rest, therefore an IV was started and 500 mL NS bolus administered.  With snack and fluid bolus patient improved.  His blood pressure improved to 98/61.  Called patient's MD office to notify.  Plan made to hold BP medication tonight and resume correct dosage tomorrow if home BP check improved and after he has spoken with his MDs.  Brother at bedside throughout and is available for patient assistance as needed.   Loyce Dys, MS RD PA-C

## 2020-08-18 NOTE — Telephone Encounter (Signed)
Tommie Raymond, PA with interventional radiology contacted the office concerned about Mr. On blood pressure prior to his scheduled thoracentesis. Per Rosanne Sack, his blood pressure was running low 84/60, stating she would not be doing his thoracentesis today because of his symptomatic low blood pressure. We compared scheduled blood pressure medications. I contacted the patients brother, Jorja Loa to check on Kamdyn & they were still at the radiologists office. Per Tim they were giving Mr. Kwong a bolus of IV fluid to help raise his blood pressure. He also stated that they figured out the root cause of his low pressure stating he was taking one of his blood pressure medications, 25mg , twice a day instead of 12.5mg  twice a day. Pt has a virtual visit with his cardiologist tomorrow. I instructed Tim to call radiology once Mr. Desroches feels better to get his thoracentesis rescheduled. Dr. Andrey Campanile aware of this situation.

## 2020-08-19 ENCOUNTER — Ambulatory Visit: Payer: Medicare Other | Admitting: Cardiology

## 2020-08-19 ENCOUNTER — Encounter: Payer: Self-pay | Admitting: Cardiology

## 2020-08-19 ENCOUNTER — Telehealth: Payer: Medicare Other | Admitting: Cardiology

## 2020-08-19 VITALS — BP 126/90 | HR 65 | Ht 73.0 in | Wt 256.0 lb

## 2020-08-19 DIAGNOSIS — Z79899 Other long term (current) drug therapy: Secondary | ICD-10-CM

## 2020-08-19 DIAGNOSIS — Z8679 Personal history of other diseases of the circulatory system: Secondary | ICD-10-CM

## 2020-08-19 DIAGNOSIS — I447 Left bundle-branch block, unspecified: Secondary | ICD-10-CM

## 2020-08-19 DIAGNOSIS — Z9889 Other specified postprocedural states: Secondary | ICD-10-CM

## 2020-08-19 DIAGNOSIS — I4819 Other persistent atrial fibrillation: Secondary | ICD-10-CM

## 2020-08-19 DIAGNOSIS — Z7901 Long term (current) use of anticoagulants: Secondary | ICD-10-CM

## 2020-08-19 DIAGNOSIS — I2510 Atherosclerotic heart disease of native coronary artery without angina pectoris: Secondary | ICD-10-CM

## 2020-08-19 DIAGNOSIS — N183 Chronic kidney disease, stage 3 unspecified: Secondary | ICD-10-CM

## 2020-08-19 DIAGNOSIS — Z951 Presence of aortocoronary bypass graft: Secondary | ICD-10-CM

## 2020-08-19 DIAGNOSIS — E782 Mixed hyperlipidemia: Secondary | ICD-10-CM

## 2020-08-19 DIAGNOSIS — I6522 Occlusion and stenosis of left carotid artery: Secondary | ICD-10-CM

## 2020-08-19 NOTE — Addendum Note (Signed)
Addended by: Durward Mallard on: 08/19/2020 05:39 PM   Modules accepted: Orders

## 2020-08-19 NOTE — Progress Notes (Addendum)
[name] Rice Date of Birth: 05-02-1951 MRN: [id number] Primary Care Provider:Kaplan, Isidor Holts., PA-C Former Cardiology Providers: Dr. Rudean Hitt, APRN, FNP-C Primary Cardiologist: Tessa Lerner, DO, [facility] (established care 01/14/2020)  Date: 08/19/20 Last Visit: 07/03/2020   Chief Complaint  Patient presents with  . Atrial Fibrillation  . Follow-up    s/p CABG.     I connected with the patient on 08/19/20 by a telephone call and verified that I am speaking with the correct person using two identifiers.     I offered the patient a video enabled application for a virtual visit. Unfortunately, this could not be accomplished due to technical difficulties/lack of video enabled phone/computer. I discussed the limitations of evaluation and management by telemedicine. The patient expressed understanding and agreed to proceed.   This format is felt to be most appropriate for this patient at this time.  All issues noted in this document were discussed and addressed.  No physical exam was performed. The patient has consented to conduct a Tele health visit and understands insurance will be billed.   HPI  [name] Rice is a 69 y.o.  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  Of note, patient was scheduled for an in office visit.  However due to recent hypotension and unable to undergo thoracentesis patient rescheduled his office visit as a telephone encounter.  Patient was identified by 2 different patient  identifiers prior to the encounter over the phone.  I last saw him in the office back in August 2021 after having a near syncopal event.  The plan was to discontinue beta-blocker therapy and undergo a monitor to evaluate for underlying dysrhythmias.  Subsequently patient did undergo monitored and was found to have multiple episodes of nonsustained ventricular tachycardia and ventricular standstill reaching up to 9 seconds in duration.  In the setting of nonsustained ventricular tachycardia, ventricular standstill, change in LVEF, and abnormal stress test patient I was able to convince the patient to undergo left heart catheterization for further evaluation and management.  Coronary angiography was performed and noted to have multivessel CAD and patient was recommended to undergo bypass surgery.  Patient underwent four-vessel bypass surgery in September 2021 with Dr. Vickey Sages.  He did well post procedure and then was transitioned to [place] rehab facility for approximately 8 days.  Patient states that he recently was discharged home on August 16, 2020.  He was scheduled to undergo thoracentesis as per Dr. Conni Slipper recommendation on August 18, 2020; however, due to hypotension the procedure did not take place.  It appeared that the patient was taking a higher dose of metoprolol than originally recommended.  He was taking 25 mg p.o. twice daily when he was supposed to actually take 12.5 mg p.o. twice daily.  He has been holding his beta-blocker therapy and today states that his blood pressures have improved he has been checking it several times and his systolic range between 125-135 mmHg.  Around noon patient states that his systolic blood pressures were 341 mmHg and therefore proceeded to take 12.5 mg of Lopressor x1.  Patient is overall in good spirits despite his recent  hospitalization and rehab stay.  Medications reconciled over the phone.  Review of his preoperative work-up notes left carotid artery  stenosis; however, he was unaware of this finding.  Shared decision was to work on rehab and getting back to baseline prior to additional work-up at this time.  He is aware to look out for signs of left-sided vision changes and contralateral weakness/focal deficits.    ALLERGIES: No Known Allergies  MEDICATION LIST PRIOR TO VISIT: Current Outpatient Medications on File Prior to Visit  Medication Sig Dispense Refill  . acetaminophen (TYLENOL) 325 MG tablet Take 650 mg by mouth every 6 (six) hours as needed for mild pain.    Marland Kitchen amiodarone (PACERONE) 200 MG tablet Take 1 tablet (200 mg total) by mouth daily. 30 tablet 0  . amLODipine (NORVASC) 10 MG tablet Take 10 mg by mouth daily.    Marland Kitchen aspirin EC 81 MG EC tablet Take 1 tablet (81 mg total) by mouth daily. Swallow whole. 30 tablet 11  . Ensure (ENSURE) Take 237 mLs by mouth daily. Vanilla if available    . guaiFENesin (MUCINEX) 600 MG 12 hr tablet Take 1 tablet (600 mg total) by mouth 2 (two) times daily as needed. 60 tablet 0  . isosorbide dinitrate (ISORDIL) 10 MG tablet Take 1 tablet (10 mg total) by mouth 3 (three) times daily. 90 tablet 0  . melatonin 5 MG TABS Take 5 mg by mouth at bedtime.    . metoprolol tartrate (LOPRESSOR) 25 MG tablet Take 0.5 tablets (12.5 mg total) by mouth 2 (two) times daily. (Patient taking differently: Take 25 mg by mouth daily. )    . NON FORMULARY Heart Healthy chopped/thin diet consistency    . olmesartan (BENICAR) 40 MG tablet Take 40 mg by mouth daily.    . rosuvastatin (CRESTOR) 40 MG tablet Take 1 tablet (40 mg total) by mouth daily.    Carlena Hurl 20 MG TABS tablet TAKE 1 TABLET BY MOUTH IN  THE EVENING AFTER DINNER 90 tablet 1   No current facility-administered medications on file prior to visit.    PAST MEDICAL HISTORY: Past Medical History:  Diagnosis Date  . Atrial fibrillation (HCC)   . Coronary artery disease   . Dysrhythmia   . Hypertension   . LBBB (left bundle branch block)     PAST  SURGICAL HISTORY: Past Surgical History:  Procedure Laterality Date  . CARDIOVERSION N/A 09/28/2016   Procedure: CARDIOVERSION;  Surgeon: Yates Decamp, MD;  Location: [facility] ENDOSCOPY;  Service: Cardiovascular;  Laterality: N/A;  . CLIPPING OF ATRIAL APPENDAGE Left 07/29/2020   Procedure: CLIPPING OF ATRIAL APPENDAGE;  Surgeon: Linden Dolin, MD;  Location: [facility] OR;  Service: Open Heart Surgery;  Laterality: Left;  . CORONARY ARTERY BYPASS GRAFT N/A 07/29/2020   Procedure: CORONARY ARTERY BYPASS GRAFTING (CABG) TIMES FOUR USING BILATERAL INTERNAL MAMMARIES AND LEFT RADIAL ARTERY;  Surgeon: Linden Dolin, MD;  Location: [facility] OR;  Service: Open Heart Surgery;  Laterality: N/A;  . LEFT HEART CATH AND CORONARY ANGIOGRAPHY N/A 07/25/2020   Procedure: LEFT HEART CATH AND CORONARY ANGIOGRAPHY;  Surgeon: Yates Decamp, MD;  Location: [facility] INVASIVE CV LAB;  Service: Cardiovascular;  Laterality: N/A;  . MAZE N/A 07/29/2020   Procedure: MAZE;  Surgeon: Linden Dolin, MD;  Location: [facility] OR;  Service: Open Heart Surgery;  Laterality: N/A;  . NO PAST SURGERIES    . RADIAL ARTERY HARVEST Left 07/29/2020   Procedure: RADIAL ARTERY HARVEST;  Surgeon: Valentina Lucks  Z, MD;  Location: [facility] OR;  Service: Open Heart Surgery;  Laterality: Left;  . TEE WITHOUT CARDIOVERSION N/A 07/29/2020   Procedure: TRANSESOPHAGEAL ECHOCARDIOGRAM (TEE);  Surgeon: Linden Dolin, MD;  Location: [facility] OR;  Service: Open Heart Surgery;  Laterality: N/A;    FAMILY HISTORY: The patient's family history includes Hypertension in an other family member.   SOCIAL HISTORY:  The patient  reports that he has never smoked. He has never used smokeless tobacco. He reports that he does not drink alcohol and does not use drugs.  Review of Systems  Constitutional: Negative for chills and fever.  HENT: Negative for hoarse voice and nosebleeds.   Eyes: Negative for discharge, double vision and pain.  Cardiovascular: Negative for chest pain, claudication,  dyspnea on exertion, leg swelling, near-syncope, orthopnea, palpitations, paroxysmal nocturnal dyspnea and syncope.  Respiratory: Positive for shortness of breath (improved.). Negative for hemoptysis.   Musculoskeletal: Negative for muscle cramps and myalgias.  Gastrointestinal: Negative for abdominal pain, constipation, diarrhea, hematemesis, hematochezia, melena, nausea and vomiting.  Neurological: Negative for dizziness and light-headedness.    PHYSICAL EXAM: Vitals with BMI 08/19/2020 08/18/2020 08/18/2020  Height  - -  Weight 256 lbs - -  BMI 33.78 - -  Systolic 126 98 113  Diastolic 90 61 65  Pulse 65 - 56   Because this was a telephone encounter copied the last physical examination performed on 07/24/2020.  CONSTITUTIONAL: Appears older than stated age, hemodynamically stable, no acute distress.   SKIN: Skin is warm and dry. No rash noted. No cyanosis. No pallor. No jaundice HEAD: Normocephalic and atraumatic.  EYES: No scleral icterus MOUTH/THROAT: Moist oral membranes.  NECK: No JVD present. No thyromegaly noted. No carotid bruits  LYMPHATIC: No visible cervical adenopathy.  CHEST Normal respiratory effort. No intercostal retractions  LUNGS: Clear to auscultation bilaterally.  No stridor. No wheezes. No rales.  CARDIOVASCULAR: Irregularly irregular, positive S1-S2, no murmurs rubs or gallops appreciated. ABDOMINAL: No apparent ascites.  EXTREMITIES: No peripheral edema  HEMATOLOGIC: No significant bruising NEUROLOGIC: Oriented to person, place, and time. Nonfocal. Normal muscle tone.  PSYCHIATRIC: Normal mood and affect. Normal behavior. Cooperative  RADIOLOGY: 08/11/2020 CXR:  Persistent left pleural effusion with left base atelectasis. Scattered calcified granulomas noted. Lungs elsewhere clear. Stable cardiac enlargement with postoperative changes. Aortic Atherosclerosis (ICD10-I70.0  CARDIAC DATABASE: EKG: 07/23/2020: Atrial fibrillation with controlled  ventricular response, underlying left bundle branch block.  No further analysis.  Coronary artery bypass grafting: 07/29/2020 (by Dr. Vickey Sages at [facility]):  Left Internal Mammary Artery to Distal Left Anterior Descending Coronary Artery; pedicled RIMA Graft to Posterior Descending Coronary Artery; left radial artery  Graft to Obtuse Marginal Branch of Left Circumflex Coronary Artery and ramus intermedius as a sequenced graft. Bi-atrial Maze procedure and left atrial appendage clipping  Echocardiogram: 12/31/2019: Mildly depressed LV systolic function with visual EF 45-50%. Left ventricle cavity is normal in size. Left ventricle regional wall motion findings: Mid anteroseptal, Mid inferoseptal, Apical anterior, Apical septal and Apical cap hypokinesis. Mild left ventricular hypertrophy.  Unable to evaluate diastolic function due to atrial fibrillation. Calculated EF 50%. Left atrial cavity is severely dilated. Right atrial cavity is slightly dilated. Right ventricle cavity is normal in size. Low normal right ventricular function. Mild aortic valve leaflet thickening with mild calcification. Mildly restricted aortic valve leaflets. No evidence of aortic valve stenosis. No aortic valve regurgitation. Mild tricuspid regurgitation. Mild pulmonary hypertension. RVSP measures 35 mmHg. IVC is dilated with a respiratory response of >  50%. Prior study 08/2016: Moderate concentric hypertrophy of the left ventricle. Normal global wall motion. Visual EF is 55-60%. Indeterminate diastolic filling pattern, indeterminate LAP due to A. Fib. Biatrial enlargement. Mild (Grade I) mitral regurgitation. Mild tricuspid regurgitation. No evidence of pulmonary hypertension.   Stress Testing:  02/18/2020: No previous exam available for comparison. Lexiscan nuclear stress test performed using 1-day protocol. Stress EKG is non-diagnostic, as this is pharmacological stress test. In addition, rest and stress EKG showed atrial  fibrillation with slow ventricular response, anterolateral T wave inversion. Stress LVEF 77%. Normal myocardial perfusion. Low risk study.  Heart Catheterization: 07/25/20: RCA: Proximal RCA 80% stenosis, large vessel with mild disease in PL and PDA branches. A secondary PL branch is subtotally occluded. LM: Distal 30-40% stenosis. LAD: LAD diffusely diseased, proximal diffuse 80% followed by tandem 90% stenosis. Large D1 with moderate diffuse disease and proximal and mid tandem 70 and 80% stenosis. Has secondary branches and is tortuous. Cx and RI: Co-dominant Cx with Ostial circumflex 99% involving a moderate sized ramus with ostial 80 to 90% and proximal 90% stenosis. LV: Normal LVEDP. No pressure gradient across the aortic valve. Patent LIMA and RIMA and RIMA has ostial 20-30% stenosis. Subclavian arteries widely patent.  Right radial diffusely disease by [place]. 4mL contrast used.   Carotid duplex: 07/27/2020:  Right Carotid: Velocities in the right ICA are consistent with a 1-39% stenosis.  Left Carotid: Velocities in the left ICA are consistent with a 60-79% stenosis.  Vertebrals: Bilateral vertebral arteries demonstrate antegrade flow.   14 day extended Holter monitor: Dominant rhythm atrial fibrillation (HR between 25-137bpm, avg. 59bpm).  Overall HR 25-197 bpm. Avg HR 59 bpm. 1519 pauses that were 3 secs or longer.  The longest pause 9.5 sec at 12:36 AM (07/04/2020), followed by 9.4 sec pause at 4:30 AM (07/11/2020), third longest pause 8.9 sec (07/08/2020). These are auto-triggered events. 20 episodes of NSVT reported. Longest episode 20 beats in duration for 10.5 seconds at an average rate 116 bpm. The fastest episode was 6 beats in duration at average rate of 170 bpm.  Total ventricular ectopic burden <1%. Patient activated events: 0.  LABORATORY DATA: CBC Latest Ref Rng & Units 08/12/2020 08/03/2020 08/02/2020  WBC 4.0 - 10.5 K/uL - 12.4(H) 14.0(H)  Hemoglobin 13.0 -  17.7 g/dL 57.3 9.0(L) 8.4(L)  Hematocrit 37.5 - 51.0 % 43.3 28.7(L) 27.6(L)  Platelets 150 - 400 K/uL - 330 268    CMP Latest Ref Rng & Units 08/12/2020 08/05/2020 08/03/2020  Glucose 65 - 99 mg/dL 97 220(U) 542(H)  BUN 8 - 27 mg/dL 18 15 23   Creatinine 0.76 - 1.27 mg/dL 0.62 3.76)  Sodium 134 - 144 mmol/L 140 133(L) 134(L)  Potassium 3.5 - 5.2 mmol/L 4.8 3.7 3.4(L)  Chloride 96 - 106 mmol/L 106 96(L) 98  CO2 20 - 29 mmol/L 21 28 25   Calcium 8.6 - 10.2 mg/dL 9.3 2.83(T) 8.3(L)  Total Protein 6.5 - 8.1 g/dL - - -  Total Bilirubin 0.3 - 1.2 mg/dL - - -  Alkaline Phos 38 - 126 U/L - - -  AST 15 - 41 U/L - - -  ALT 0 - 44 U/L - - -    Lipid Panel     Component Value Date/Time   CHOL 78 07/26/2020 0515   CHOL 143 07/10/2019 0906   TRIG 92 07/26/2020 0515   HDL 24 (L) 07/26/2020 0515   HDL 51 07/10/2019 0906   CHOLHDL 3.3 07/26/2020 0515  VLDL 18 07/26/2020 0515   LDLCALC 36 07/26/2020 0515   LDLCALC 74 07/10/2019 0906   LABVLDL 18 07/10/2019 0906    Lab Results  Component Value Date   HGBA1C 5.2 07/26/2020   No components found for: NTPROBNP Lab Results  Component Value Date   TSH 0.941 07/24/2020   TSH 1.898 07/02/2020    Cardiac Panel (last 3 results) No results for input(s): CKTOTAL, CKMB, TROPONINIHS, RELINDX in the last 72 hours.  IMPRESSION:    ICD-10-CM   1. Atherosclerosis of native coronary artery of native heart without angina pectoris  I25.10   2. Hx of LIMA to dLAD, pedicled RIMA Graft to PDA, left radial artery  Graft to OM and Ramus as sequenced graft.  Z95.1   3. S/P Maze operation for atrial fibrillation  Z98.890    Z86.79   4. S/P left atrial appendage clipping  Z98.890   5. Persistent atrial fibrillation (HCC)  I48.19   6. Long term current use of antiarrhythmic drug  Z79.899   7. Long term current use of anticoagulant  Z79.01   8. Left bundle branch block  I44.7   9. Benign hypertension with CKD (chronic kidney disease) stage III (HCC)   I12.9    N18.30   10. Mixed hyperlipidemia  E78.2   11. Asymptomatic stenosis of left carotid artery  I65.22      RECOMMENDATIONS: [name] BankerJames R Texeira is a 69 y.o. 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  Multivessel CAD s/p four-vessel bypass without angina pectoris:  Medications reconciled.  Reviewed the hospitalization records, discharge summary, operative report, carotid duplex results and summarized above.  Recommending restarting Lopressor 12.5 mg p.o. twice daily with holding parameters.  Hold if systolic blood pressure less than 100 mmHg or heart rate less than 60 bpm.  Hold isosorbide dinitrate 10 mg p.o. 3 times daily for now as he has been having episodes of hypotension.  Follow-up with cardiothoracic surgeon Dr. Vickey SagesAtkins as recommended  Recommend cardiac rehab once cleared by Dr. Vickey SagesAtkins.   Persistent atrial fibrillation, status post biatrial maze procedure:  Rate control: Beta-blocker therapy.  Rhythm control: Amiodarone.  Thromboembolic prophylaxis: Xarelto.  Status post left atrial appendage clipping.  History of failed cardioversion in the past.  Continue current medical management.  Long-term oral anticoagulation: Reemphasized the risks, benefits, and alternatives to oral anticoagulation.  Asymptomatic left carotid artery stenosis:  Found on pre-CABG work-up.  Currently asymptomatic.  Shared decision was to proceed with rehab efforts after recent bypass surgery and to reevaluate in 6 months.  Patient is asked to be more cognizant of vision changes involving the left eye and  contralateral body.  Recommend restarting aspirin 81 mg p.o. daily when cleared by cardiothoracic surgery.  Continue statin therapy.  Benign essential hypertension:  Home blood pressures reviewed with him over the phone.  Restart Lopressor 12.5 mg p.o. twice daily given recent bypass surgery.  Hold off Isordil for now as he is having episodes of hypotension.  However, if his systolic blood pressure is greater than 140 mmHg he is asked to restart the medication.  We will continue to follow.  I have asked him to reach out to Dr. Vickey SagesAtkins office to reschedule the thoracentesis if still  clinically warranted.  I would like to see him back in the office in 1 month to reevaluate his progress and we discussed disease management.   FINAL MEDICATION LIST END OF ENCOUNTER: No orders of the defined types were placed in this encounter.   There are no discontinued medications.   Current Outpatient Medications:  .  acetaminophen (TYLENOL) 325 MG tablet, Take 650 mg by mouth every 6 (six) hours as needed for mild pain., Disp: , Rfl:  .  amiodarone (PACERONE) 200 MG tablet, Take 1 tablet (200 mg total) by mouth daily., Disp: 30 tablet, Rfl: 0 .  amLODipine (NORVASC) 10 MG tablet, Take 10 mg by mouth daily., Disp: , Rfl:  .  aspirin EC 81 MG EC tablet, Take 1 tablet (81 mg total) by mouth daily. Swallow whole., Disp: 30 tablet, Rfl: 11 .  Ensure (ENSURE), Take 237 mLs by mouth daily. Vanilla if available, Disp: , Rfl:  .  guaiFENesin (MUCINEX) 600 MG 12 hr tablet, Take 1 tablet (600 mg total) by mouth 2 (two) times daily as needed., Disp: 60 tablet, Rfl: 0 .  isosorbide dinitrate (ISORDIL) 10 MG tablet, Take 1 tablet (10 mg total) by mouth 3 (three) times daily., Disp: 90 tablet, Rfl: 0 .  melatonin 5 MG TABS, Take 5 mg by mouth at bedtime., Disp: , Rfl:  .  metoprolol tartrate (LOPRESSOR) 25 MG tablet, Take 0.5 tablets (12.5 mg total) by mouth 2 (two) times daily. (Patient taking differently: Take 25 mg by  mouth daily. ), Disp: , Rfl:  .  NON FORMULARY, Heart Healthy chopped/thin diet consistency, Disp: , Rfl:  .  olmesartan (BENICAR) 40 MG tablet, Take 40 mg by mouth daily., Disp: , Rfl:  .  rosuvastatin (CRESTOR) 40 MG tablet, Take 1 tablet (40 mg total) by mouth daily., Disp: , Rfl:  .  XARELTO 20 MG TABS tablet, TAKE 1 TABLET BY MOUTH IN  THE EVENING AFTER DINNER, Disp: 90 tablet, Rfl: 1  No orders of the defined types were placed in this encounter.  --Continue cardiac medications as reconciled in final medication list. --Return in about 4 weeks (around 09/16/2020) for Reevaluation of CAD, status post CABG, labs prior to next office visit. Or sooner if needed. --Continue follow-up with your primary care physician regarding the management of your other chronic comorbid conditions.  Patient's questions and concerns were addressed to his satisfaction. He voices understanding of the instructions provided during this encounter.   This note was created using a voice recognition software as a result there may be grammatical errors inadvertently enclosed that do not reflect the nature of this encounter. Every attempt is made to correct such errors.  Tessa Lerner, [place], [facility]  Pager: [phone] Office: [phone]

## 2020-08-20 ENCOUNTER — Telehealth: Payer: Self-pay

## 2020-08-20 NOTE — Telephone Encounter (Signed)
He can try OTC colace 100 mg daily for 3-4 days and use Murelax 1 tbl daily or BID

## 2020-08-20 NOTE — Telephone Encounter (Signed)
Pt called to inform us that he has not had a bowel movement. Pt would like to know what should he take

## 2020-08-21 ENCOUNTER — Telehealth: Payer: Self-pay

## 2020-08-21 NOTE — Telephone Encounter (Signed)
Patient called and stated that he is just not feeling well. He stated that his BP has been fluctuating a lot. He says that he has had a lot of moments that he feels like he is going faint and is not sure if it comes after his open heart surgery. He would like to know what he should do at this point, because the hospital already sent him home, so he is concerned they will not re-admit him. He would like for you to give him a call ASAP.

## 2020-08-22 ENCOUNTER — Telehealth: Payer: Self-pay

## 2020-08-22 ENCOUNTER — Ambulatory Visit (HOSPITAL_COMMUNITY): Payer: Medicare Other | Admitting: Physician Assistant

## 2020-08-22 NOTE — Telephone Encounter (Signed)
Patient called regarding his blood pressure in the morning it was 169/88 and right now after lunch it went down to 112 patient is worried about his bp being uncontrolled patient states the hospital started him on isosorbide please advise

## 2020-08-22 NOTE — Telephone Encounter (Signed)
Bring him in today. Also, thanks. Dr. Odis Hollingshead patient.

## 2020-08-22 NOTE — Telephone Encounter (Signed)
Spoke with the patient and he is doing well. Educated him that his blood pressures is a spectrum and not an isolated reading. Since he had a recent episode of hypotension after taking excess amount of metoprolol last week a like to keep his blood pressure around 120-140 mmHg.  Advised him to take Isordil 10 mg p.o. as needed 3 times daily if systolic blood pressures greater than 140 mmHg.  Overall patient doing well with no cardiac symptoms and no focal deficits.

## 2020-08-22 NOTE — Telephone Encounter (Signed)
Spoke to patient, patient states he had a bowel movement

## 2020-08-23 NOTE — Telephone Encounter (Signed)
Hope he is seeing Dr. Odis Hollingshead

## 2020-08-26 ENCOUNTER — Encounter: Payer: Self-pay | Admitting: Cardiology

## 2020-08-26 ENCOUNTER — Ambulatory Visit: Payer: Medicare Other | Admitting: Cardiology

## 2020-08-26 ENCOUNTER — Other Ambulatory Visit: Payer: Self-pay

## 2020-08-26 VITALS — BP 104/71 | HR 61 | Resp 16 | Ht 73.0 in | Wt 240.0 lb

## 2020-08-26 DIAGNOSIS — I4819 Other persistent atrial fibrillation: Secondary | ICD-10-CM

## 2020-08-26 DIAGNOSIS — I2510 Atherosclerotic heart disease of native coronary artery without angina pectoris: Secondary | ICD-10-CM

## 2020-08-26 DIAGNOSIS — Z7901 Long term (current) use of anticoagulants: Secondary | ICD-10-CM

## 2020-08-26 DIAGNOSIS — I447 Left bundle-branch block, unspecified: Secondary | ICD-10-CM

## 2020-08-26 DIAGNOSIS — Z8679 Personal history of other diseases of the circulatory system: Secondary | ICD-10-CM

## 2020-08-26 DIAGNOSIS — I6522 Occlusion and stenosis of left carotid artery: Secondary | ICD-10-CM

## 2020-08-26 DIAGNOSIS — I129 Hypertensive chronic kidney disease with stage 1 through stage 4 chronic kidney disease, or unspecified chronic kidney disease: Secondary | ICD-10-CM

## 2020-08-26 DIAGNOSIS — E782 Mixed hyperlipidemia: Secondary | ICD-10-CM

## 2020-08-26 DIAGNOSIS — Z9889 Other specified postprocedural states: Secondary | ICD-10-CM

## 2020-08-26 DIAGNOSIS — Z951 Presence of aortocoronary bypass graft: Secondary | ICD-10-CM

## 2020-08-26 DIAGNOSIS — N183 Chronic kidney disease, stage 3 unspecified: Secondary | ICD-10-CM

## 2020-08-26 MED ORDER — METOPROLOL TARTRATE 25 MG PO TABS: 12.5000 mg | ORAL_TABLET | Freq: Every day | ORAL | Status: DC

## 2020-08-26 NOTE — Progress Notes (Signed)
[name] Rice Date of Birth: 11-Apr-1951 MRN: [id number] Primary Care Provider:Kaplan, Isidor Holts., PA-C Former Cardiology Providers: Dr. Rudean Hitt, APRN, FNP-C Primary Cardiologist: Tessa Lerner, DO, [facility] (established care 01/14/2020)  Date: 08/26/20 Last Office Visit: 07/03/2020  Chief Complaint  Patient presents with  . Hypertension  . Coronary Artery Disease  . Follow-up   HPI  [name] Rice is a 69 y.o.  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  Patient had a near syncopal event back in August 2021 and thereafter underwent Holter monitor to evaluate for underlying dysrhythmias.  He had multiple episodes of nonsustained ventricular tachycardia and ventricular standstill reaching up to 9 seconds in duration.  In the setting of nonsustained ventricular tachycardia, ventricular standstill, change in LVEF, and abnormal stress test I was able to convince the patient to undergo left heart catheterization for further evaluation and management.  Coronary angiography was performed and noted to have multivessel CAD and patient was recommended to undergo bypass surgery.  Patient underwent four-vessel bypass surgery in September 2021 with Dr. Vickey Sages.  He did well post procedure and then was transitioned to [facility] rehab facility.  Post discharge from rehab facility patient states that he is doing well and getting stronger.  He still has not started cardiac rehab I would like to discuss it further with Dr.  Vickey Sages prior to doing so.  Patient states that his shortness of breath has significantly improved.  He was planned to undergo thoracentesis in the recent past but still has not have this performed.  I have asked him to discuss this further with Dr. Renaldo Fiddler at his upcoming office visit next week.  Since his medication titration patient states that his systolic blood pressures at home range between 111-150 mmHg, average SBP is 130 mmHg.  And diastolic blood pressures are averaging around 80 mmHg.  He still feels tired and fatigued but slowly improving.  ALLERGIES: No Known Allergies  MEDICATION LIST PRIOR TO VISIT: Current Outpatient Medications on File Prior to Visit  Medication Sig Dispense Refill  . amLODipine (NORVASC) 10 MG tablet Take 10 mg by mouth daily.    . Ensure (ENSURE) Take 237 mLs by mouth daily. Vanilla if available    . olmesartan (BENICAR) 40 MG tablet Take 40 mg by mouth daily.    . rosuvastatin (CRESTOR) 40 MG tablet Take 1 tablet (40 mg total) by mouth daily.    Carlena Hurl 20 MG TABS tablet TAKE 1 TABLET BY MOUTH IN  THE EVENING AFTER DINNER 90 tablet 1  . acetaminophen (TYLENOL) 325 MG tablet Take 650 mg by mouth every 6 (six) hours as needed for mild pain. (Patient not taking: Reported on 08/26/2020)    . isosorbide dinitrate (ISORDIL) 10 MG tablet Take 10 mg by mouth 3 (three) times daily.     No current facility-administered medications on file prior to visit.    PAST MEDICAL HISTORY: Past Medical History:  Diagnosis Date  . Atrial fibrillation (HCC)   . Coronary artery disease   . Dysrhythmia   . Hypertension   . LBBB (left bundle branch block)     PAST  SURGICAL HISTORY: Past Surgical History:  Procedure Laterality Date  . CARDIOVERSION N/A 09/28/2016   Procedure: CARDIOVERSION;  Surgeon: Yates Decamp, MD;  Location: [facility] ENDOSCOPY;  Service: Cardiovascular;  Laterality: N/A;  . CLIPPING OF ATRIAL APPENDAGE Left 07/29/2020   Procedure: CLIPPING OF ATRIAL  APPENDAGE;  Surgeon: Linden Dolin, MD;  Location: [facility] OR;  Service: Open Heart Surgery;  Laterality: Left;  . CORONARY ARTERY BYPASS GRAFT N/A 07/29/2020   Procedure: CORONARY ARTERY BYPASS GRAFTING (CABG) TIMES FOUR USING BILATERAL INTERNAL MAMMARIES AND LEFT RADIAL ARTERY;  Surgeon: Linden Dolin, MD;  Location: [facility] OR;  Service: Open Heart Surgery;  Laterality: N/A;  . LEFT HEART CATH AND CORONARY ANGIOGRAPHY N/A 07/25/2020   Procedure: LEFT HEART CATH AND CORONARY ANGIOGRAPHY;  Surgeon: Yates Decamp, MD;  Location: [facility] INVASIVE CV LAB;  Service: Cardiovascular;  Laterality: N/A;  . MAZE N/A 07/29/2020   Procedure: MAZE;  Surgeon: Linden Dolin, MD;  Location: [facility] OR;  Service: Open Heart Surgery;  Laterality: N/A;  . NO PAST SURGERIES    . RADIAL ARTERY HARVEST Left 07/29/2020   Procedure: RADIAL ARTERY HARVEST;  Surgeon: Linden Dolin, MD;  Location: [facility] OR;  Service: Open Heart Surgery;  Laterality: Left;  . TEE WITHOUT CARDIOVERSION N/A 07/29/2020   Procedure: TRANSESOPHAGEAL ECHOCARDIOGRAM (TEE);  Surgeon: Linden Dolin, MD;  Location[facility] OR;  Service: Open Heart Surgery;  Laterality: N/A;    FAMILY HISTORY: The patient's family history includes Hypertension in an other family member.   SOCIAL HISTORY:  The patient  reports that he has never smoked. He has never used smokeless tobacco. He reports that he does not drink alcohol and does not use drugs.  Review of Systems  Constitutional: Negative for chills and fever.  HENT: Negative for hoarse voice and nosebleeds.   Eyes: Negative for discharge, double vision and pain.  Cardiovascular: Negative for chest pain, claudication, dyspnea on exertion, leg swelling, near-syncope, orthopnea, palpitations, paroxysmal nocturnal dyspnea and syncope.  Respiratory: Positive for cough (clear productive cough) and shortness of breath (improved.). Negative for hemoptysis.   Musculoskeletal: Negative for muscle cramps and myalgias.   Gastrointestinal: Negative for abdominal pain, constipation, diarrhea, hematemesis, hematochezia, melena, nausea and vomiting.  Neurological: Negative for dizziness and light-headedness.    PHYSICAL EXAM: Vitals with BMI 08/26/2020 08/19/2020 08/18/2020  Height   -  Weight 240 lbs 256 lbs -  BMI 31.67 33.78 -  Systolic 104 126 98  Diastolic 71 90 61  Pulse 61 65 -   CONSTITUTIONAL: Appears older than stated age, hemodynamically stable, no acute distress.   SKIN: Skin is warm and dry. No rash noted. No cyanosis. No pallor. No jaundice HEAD: Normocephalic and atraumatic.  EYES: No scleral icterus MOUTH/THROAT: Moist oral membranes.  NECK: No JVD present. No thyromegaly noted. No carotid bruits  LYMPHATIC: No visible cervical adenopathy.  CHEST Normal respiratory effort. No intercostal retractions.  Sternotomy site is healing well. LUNGS: Clear to auscultation in the upper lung fields with decreased breath sounds at the bases, No stridor. No wheezes. No rales.  CARDIOVASCULAR: Irregularly irregular, bradycardic, positive S1-S2, no murmurs rubs or gallops appreciated. ABDOMINAL: No apparent ascites.  EXTREMITIES: No peripheral edema.  Left radial site is healing well. HEMATOLOGIC: No significant bruising NEUROLOGIC: Oriented to person, place, and time. Nonfocal. Normal muscle tone.  PSYCHIATRIC: Normal mood and affect. Normal behavior. Cooperative  RADIOLOGY: 08/11/2020 CXR:  Persistent left pleural effusion with left base atelectasis. Scattered calcified granulomas noted. Lungs  elsewhere clear. Stable cardiac enlargement with postoperative changes. Aortic Atherosclerosis (ICD10-I70.0  CARDIAC DATABASE: EKG: 08/26/2020: Atrial fibrillation, 52 bpm, left bundle branch block, poor R wave progression,  Coronary artery bypass grafting: 07/29/2020 (by [facility] Vickey [facility]agesAtkins at [facility]):  Left Internal Mammary Artery to Distal Left Anterior Descending Coronary Artery; pedicled RIMA  Graft to Posterior Descending Coronary Artery; left radial artery  Graft to Obtuse Marginal Branch of Left Circumflex Coronary Artery and ramus intermedius as a sequenced graft. Bi-atrial Maze procedure and left atrial appendage clipping  Echocardiogram: 12/31/2019: Mildly depressed LV systolic function with visual EF 45-50%. Left ventricle cavity is normal in size. Left ventricle regional wall motion findings: Mid anteroseptal, Mid inferoseptal, Apical anterior, Apical septal and Apical cap hypokinesis. Mild left ventricular hypertrophy.  Unable to evaluate diastolic function due to atrial fibrillation. Calculated EF 50%. Left atrial cavity is severely dilated. Right atrial cavity is slightly dilated. Right ventricle cavity is normal in size. Low normal right ventricular function. Mild aortic valve leaflet thickening with mild calcification. Mildly restricted aortic valve leaflets. No evidence of aortic valve stenosis. No aortic valve regurgitation. Mild tricuspid regurgitation. Mild pulmonary hypertension. RVSP measures 35 mmHg. IVC is dilated with a respiratory response of >50%. Prior study 08/2016: Moderate concentric hypertrophy of the left ventricle. Normal global wall motion. Visual EF is 55-60%. Indeterminate diastolic filling pattern, indeterminate LAP due to A. Fib. Biatrial enlargement. Mild (Grade I) mitral regurgitation. Mild tricuspid regurgitation. No evidence of pulmonary hypertension.   Stress Testing:  02/18/2020: No previous exam available for comparison. Lexiscan nuclear stress test performed using 1-day protocol. Stress EKG is non-diagnostic, as this is pharmacological stress test. In addition, rest and stress EKG showed atrial fibrillation with slow ventricular response, anterolateral T wave inversion. Stress LVEF 77%. Normal myocardial perfusion. Low risk study.  Heart Catheterization: 07/25/20: RCA: Proximal RCA 80% stenosis, large vessel with mild disease in PL and PDA  branches. A secondary PL branch is subtotally occluded. LM: Distal 30-40% stenosis. LAD: LAD diffusely diseased, proximal diffuse 80% followed by tandem 90% stenosis. Large D1 with moderate diffuse disease and proximal and mid tandem 70 and 80% stenosis. Has secondary branches and is tortuous. Cx and RI: Co-dominant Cx with Ostial circumflex 99% involving a moderate sized ramus with ostial 80 to 90% and proximal 90% stenosis. LV: Normal LVEDP. No pressure gradient across the aortic valve. Patent LIMA and RIMA and RIMA has ostial 20-30% stenosis. Subclavian arteries widely patent.  Right radial diffusely disease by US. 65mL contrast used.   Carotid duplex: 07/27/2020:  Right Carotid: Velocities in the right ICA are consistent with a 1-39% stenosis.  Left Carotid: Velocities in the left ICA are consistent with a 60-79% stenosis.  Vertebrals: Bilateral vertebral arteries demonstrate antegrade flow.   14 day extended Holter monitor: Dominant rhythm atrial fibrillation (HR between 25-137bpm, avg. 59bpm).  Overall HR 25-197 bpm. Avg HR 59 bpm. 1519 pauses that were 3 secs or longer.  The longest pause 9.5 sec at 12:36 AM (07/04/2020), followed by 9.4 sec pause at 4:30 AM (07/11/2020), third longest pause 8.9 sec (07/08/2020). These are auto-triggered events. 20 episodes of NSVT reported. Longest episode 20 beats in duration for 10.5 seconds at an average rate 116 bpm. The fastest episode was 6 beats in duration at average rate of 170 bpm.  Total ventricular ectopic burden <1%. Patient activated events: 0.  LABORATORY DATA: CBC Latest Ref Rng & Units 08/12/2020 08/03/2020 08/02/2020  WBC 4.0 - 10.5 K/uL - 12.4(H) 14.0(H)  Hemoglobin  13.0 - 17.7 g/dL 34.7 9.0(L) 8.4(L)  Hematocrit 37.5 - 51.0 % 43.3 28.7(L) 27.6(L)  Platelets 150 - 400 K/uL - 330 268    CMP Latest Ref Rng & Units 08/12/2020 08/05/2020 08/03/2020  Glucose 65 - 99 mg/dL 97 425(Z) 563(O)  BUN 8 - 27 mg/dL 18 15 23   Creatinine  0.76 - 1.27 mg/dL 7.56 4.33)  Sodium 134 - 144 mmol/L 140 133(L) 134(L)  Potassium 3.5 - 5.2 mmol/L 4.8 3.7 3.4(L)  Chloride 96 - 106 mmol/L 106 96(L) 98  CO2 20 - 29 mmol/L 21 28 25   Calcium 8.6 - 10.2 mg/dL 9.3 2.95(J) 8.3(L)  Total Protein 6.5 - 8.1 g/dL - - -  Total Bilirubin 0.3 - 1.2 mg/dL - - -  Alkaline Phos 38 - 126 U/L - - -  AST 15 - 41 U/L - - -  ALT 0 - 44 U/L - - -    Lipid Panel     Component Value Date/Time   CHOL 78 07/26/2020 0515   CHOL 143 07/10/2019 0906   TRIG 92 07/26/2020 0515   HDL 24 (L) 07/26/2020 0515   HDL 51 07/10/2019 0906   CHOLHDL 3.3 07/26/2020 0515   VLDL 18 07/26/2020 0515   LDLCALC 36 07/26/2020 0515   LDLCALC 74 07/10/2019 0906   LABVLDL 18 07/10/2019 0906    Lab Results  Component Value Date   HGBA1C 5.2 07/26/2020   No components found for: NTPROBNP Lab Results  Component Value Date   TSH 0.941 07/24/2020   TSH 1.898 07/02/2020    Cardiac Panel (last 3 results) No results for input(s): CKTOTAL, CKMB, TROPONINIHS, RELINDX in the last 72 hours.  IMPRESSION:    ICD-10-CM   1. Atherosclerosis of native coronary artery of native heart without angina pectoris  I25.10 EKG 12-Lead    metoprolol tartrate (LOPRESSOR) 25 MG tablet  2. Hx of CABG  Z95.1   3. S/P Maze operation for atrial fibrillation  Z98.890    Z86.79   4. S/P left atrial appendage ligation  Z98.890   5. Persistent atrial fibrillation (HCC)  I48.19   6. Long term current use of anticoagulant  Z79.01   7. Left bundle branch block  I44.7   8. Benign hypertension with CKD (chronic kidney disease) stage III (HCC)  I12.9    N18.30   9. Mixed hyperlipidemia  E78.2   10. Stenosis of left carotid artery  I65.22      RECOMMENDATIONS: [name] Rice is a 69 y.o. 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  Multivessel CAD s/p four-vessel bypass without angina pectoris:  Medications reconciled.  Given the fact the patient is feeling tired, fatigue, his heart rate trending high 50s and low 60s.  Recommend decreasing Lopressor to 12.5 mg p.o. every morning.  Currently takes isosorbide dinitrate 10 mg p.o. 3 times daily on as needed basis for SBP greater than 140 mmHg.    Follow-up with cardiothoracic surgeon Dr. 79 as recommended  Recommend cardiac rehab once cleared by Dr. 08/2020.   Persistent atrial fibrillation, status post biatrial maze procedure:  Rate control: Beta-blocker therapy.  Rhythm control: NA (patient already stopped amiodarone due to dizziness).  Thromboembolic prophylaxis: Xarelto.  Status  post left atrial appendage clipping.  History of failed cardioversion in the past.  Continue current medical management.  Long-term oral anticoagulation: Reemphasized the risks, benefits, and alternatives to oral anticoagulation.  Asymptomatic left carotid artery stenosis:  Found on pre-CABG work-up.  Currently asymptomatic.    Follow-up study scheduled in 6 months.  Would recommend aspirin 81 mg p.o. daily we will continue statin therapy.    Benign essential hypertension:  Home blood pressures reviewed with him over the phone.  Decrease Lopressor to 12.5 mg p.o. daily given his pulse rate, and in feeling tired and fatigued.    We will continue to follow.  FINAL MEDICATION LIST END OF ENCOUNTER: Meds ordered this encounter  Medications  . metoprolol tartrate (LOPRESSOR) 25 MG tablet    Sig: Take 0.5 tablets (12.5 mg total) by mouth daily.    Medications Discontinued During This Encounter  Medication Reason  . guaiFENesin (MUCINEX) 600 MG 12  hr tablet Patient Preference  . NON FORMULARY Patient Preference  . melatonin 5 MG TABS Patient Preference  . aspirin EC 81 MG EC tablet Discontinued by provider  . amiodarone (PACERONE) 200 MG tablet Patient Preference  . metoprolol tartrate (LOPRESSOR) 25 MG tablet      Current Outpatient Medications:  .  amLODipine (NORVASC) 10 MG tablet, Take 10 mg by mouth daily., Disp: , Rfl:  .  Ensure (ENSURE), Take 237 mLs by mouth daily. Vanilla if available, Disp: , Rfl:  .  metoprolol tartrate (LOPRESSOR) 25 MG tablet, Take 0.5 tablets (12.5 mg total) by mouth daily., Disp: , Rfl:  .  olmesartan (BENICAR) 40 MG tablet, Take 40 mg by mouth daily., Disp: , Rfl:  .  rosuvastatin (CRESTOR) 40 MG tablet, Take 1 tablet (40 mg total) by mouth daily., Disp: , Rfl:  .  XARELTO 20 MG TABS tablet, TAKE 1 TABLET BY MOUTH IN  THE EVENING AFTER DINNER, Disp: 90 tablet, Rfl: 1 .  acetaminophen (TYLENOL) 325 MG tablet, Take 650 mg by mouth every 6 (six) hours as needed for mild pain. (Patient not taking: Reported on 08/26/2020), Disp: , Rfl:  .  isosorbide dinitrate (ISORDIL) 10 MG tablet, Take 10 mg by mouth 3 (three) times daily., Disp: , Rfl:   Orders Placed This Encounter  Procedures  . EKG 12-Lead   --Continue cardiac medications as reconciled in final medication list. --Return in about 3 months (around 11/26/2020) for Management of CAD s/p CABG. Or sooner if needed. --Continue follow-up with your primary care physician regarding the management of your other chronic comorbid conditions.  Patient's questions and concerns were addressed to his satisfaction. He voices understanding of the instructions provided during this encounter.   This note was created using a voice recognition software as a result there may be grammatical errors inadvertently enclosed that do not reflect the nature of this encounter. Every attempt is made to correct such errors.  Total time spent: 35 minutes.  Tessa Lerner, [place],  [facility]  Pager: [phone] Office: [phone]

## 2020-09-01 ENCOUNTER — Other Ambulatory Visit: Payer: Self-pay

## 2020-09-01 ENCOUNTER — Ambulatory Visit
Admission: RE | Admit: 2020-09-01 | Discharge: 2020-09-01 | Disposition: A | Discharge status: Home / Self Care | Payer: Medicare Other | Source: Ambulatory Visit | Attending: Cardiothoracic Surgery | Admitting: Cardiothoracic Surgery

## 2020-09-01 ENCOUNTER — Ambulatory Visit (INDEPENDENT_AMBULATORY_CARE_PROVIDER_SITE_OTHER): Payer: Self-pay | Admitting: Cardiothoracic Surgery

## 2020-09-01 ENCOUNTER — Other Ambulatory Visit: Payer: Self-pay | Admitting: Cardiothoracic Surgery

## 2020-09-01 VITALS — BP 95/64 | HR 75 | Temp 97.6°F | Resp 20 | Ht 73.0 in | Wt 240.0 lb

## 2020-09-01 DIAGNOSIS — Z951 Presence of aortocoronary bypass graft: Secondary | ICD-10-CM

## 2020-09-01 DIAGNOSIS — I2510 Atherosclerotic heart disease of native coronary artery without angina pectoris: Secondary | ICD-10-CM

## 2020-09-01 NOTE — Progress Notes (Signed)
The patient returns for his second postoperative visit as an outpatient.  He has been doing well since discharge and is out of rehab now.  He has no complaints.  Physical exam:  BP 95/64    Pulse 75    Temp 97.6 F (36.4 C) (Skin)    Resp 20    Ht 6\' 1"  (1.854 m)    Wt 108.9 kg    SpO2 97% Comment: RA   BMI 31.66 kg/m   Well-appearing, no acute distress Regular rate and rhythm Diminished breath sounds at both bases No edema  Imaging: No new studies  Meds: Reviewed  Recommendations: Continue present management Follow-up with thoracic surgery as needed Chest x-ray today to assess effusion Anticoagulation per cardiology  [name] Rice Z. , MD [phone]

## 2020-09-02 ENCOUNTER — Telehealth: Payer: Self-pay

## 2020-09-02 NOTE — Telephone Encounter (Signed)
Patient called and reported some recent hypotensive blood pressures. Patient states his blood pressures have been approx: 97/65 79HR 93/67 79HR the past 48 hours. Patient reports no other symptoms. Pt is concerned blood pressure is getting too low. Please advise. Thanks!

## 2020-09-02 NOTE — Telephone Encounter (Signed)
Patient called and reported some recent hypotensive blood pressures. Patient states his blood pressures have been approx: 97/65 79HR 93/67 79HR the past 48 hours. Patient reports no other symptoms. Pt is concerned blood pressure is getting too low. Please advise. Thanks! 

## 2020-09-02 NOTE — Telephone Encounter (Signed)
Relayed information to patient. Patient voiced understanding.  

## 2020-09-02 NOTE — Telephone Encounter (Signed)
As long as the patient remains asymptomatic it should be fine. However, as she is concerned have her decrease amlodipine to 5 mg p.o. daily

## 2020-09-04 ENCOUNTER — Emergency Department (HOSPITAL_COMMUNITY)
Admission: EM | Admit: 2020-09-04 | Discharge: 2020-09-04 | Disposition: A | Discharge status: Home / Self Care | Payer: Medicare Other | Attending: Emergency Medicine | Admitting: Emergency Medicine

## 2020-09-04 ENCOUNTER — Other Ambulatory Visit: Payer: Self-pay

## 2020-09-04 ENCOUNTER — Emergency Department (HOSPITAL_COMMUNITY): Payer: Medicare Other

## 2020-09-04 ENCOUNTER — Encounter (HOSPITAL_COMMUNITY): Payer: Self-pay

## 2020-09-04 DIAGNOSIS — R748 Abnormal levels of other serum enzymes: Secondary | ICD-10-CM

## 2020-09-04 DIAGNOSIS — Z955 Presence of coronary angioplasty implant and graft: Secondary | ICD-10-CM | POA: Insufficient documentation

## 2020-09-04 DIAGNOSIS — R1013 Epigastric pain: Secondary | ICD-10-CM | POA: Diagnosis not present

## 2020-09-04 DIAGNOSIS — Z7901 Long term (current) use of anticoagulants: Secondary | ICD-10-CM | POA: Diagnosis not present

## 2020-09-04 DIAGNOSIS — I2510 Atherosclerotic heart disease of native coronary artery without angina pectoris: Secondary | ICD-10-CM | POA: Diagnosis not present

## 2020-09-04 DIAGNOSIS — R945 Abnormal results of liver function studies: Secondary | ICD-10-CM | POA: Insufficient documentation

## 2020-09-04 DIAGNOSIS — R197 Diarrhea, unspecified: Secondary | ICD-10-CM | POA: Insufficient documentation

## 2020-09-04 DIAGNOSIS — Z79899 Other long term (current) drug therapy: Secondary | ICD-10-CM | POA: Diagnosis not present

## 2020-09-04 DIAGNOSIS — I10 Essential (primary) hypertension: Secondary | ICD-10-CM | POA: Diagnosis not present

## 2020-09-04 DIAGNOSIS — R1033 Periumbilical pain: Secondary | ICD-10-CM | POA: Diagnosis not present

## 2020-09-04 DIAGNOSIS — R112 Nausea with vomiting, unspecified: Secondary | ICD-10-CM | POA: Diagnosis not present

## 2020-09-04 LAB — URINALYSIS, ROUTINE W REFLEX MICROSCOPIC
Glucose, UA: NEGATIVE mg/dL
Hgb urine dipstick: NEGATIVE
Ketones, ur: 20 mg/dL — AB
Leukocytes,Ua: NEGATIVE
Nitrite: NEGATIVE
Protein, ur: 100 mg/dL — AB
Specific Gravity, Urine: 1.026 (ref 1.005–1.030)
pH: 5 (ref 5.0–8.0)

## 2020-09-04 LAB — COMPREHENSIVE METABOLIC PANEL
ALT: 47 U/L — ABNORMAL HIGH (ref 0–44)
AST: 95 U/L — ABNORMAL HIGH (ref 15–41)
Albumin: 2.7 g/dL — ABNORMAL LOW (ref 3.5–5.0)
Alkaline Phosphatase: 398 U/L — ABNORMAL HIGH (ref 38–126)
Anion gap: 14 (ref 5–15)
BUN: 26 mg/dL — ABNORMAL HIGH (ref 8–23)
CO2: 19 mmol/L — ABNORMAL LOW (ref 22–32)
Calcium: 8.6 mg/dL — ABNORMAL LOW (ref 8.9–10.3)
Chloride: 107 mmol/L (ref 98–111)
Creatinine, Ser: 1.35 mg/dL — ABNORMAL HIGH (ref 0.61–1.24)
GFR, Estimated: 58 mL/min — ABNORMAL LOW (ref >60)
Glucose, Bld: 159 mg/dL — ABNORMAL HIGH (ref 70–99)
Potassium: 3.2 mmol/L — ABNORMAL LOW (ref 3.5–5.1)
Sodium: 140 mmol/L (ref 135–145)
Total Bilirubin: 3.7 mg/dL — ABNORMAL HIGH (ref 0.3–1.2)
Total Protein: 6.8 g/dL (ref 6.5–8.1)

## 2020-09-04 LAB — CBC WITH DIFFERENTIAL/PLATELET
Abs Immature Granulocytes: 0.06 10*3/uL (ref 0.00–0.07)
Basophils Absolute: 0 10*3/uL (ref 0.0–0.1)
Basophils Relative: 0 %
Eosinophils Absolute: 0.1 10*3/uL (ref 0.0–0.5)
Eosinophils Relative: 1 %
HCT: 42.2 % (ref 39.0–52.0)
Hemoglobin: 12.4 g/dL — ABNORMAL LOW (ref 13.0–17.0)
Immature Granulocytes: 1 %
Lymphocytes Relative: 9 %
Lymphs Abs: 0.9 10*3/uL (ref 0.7–4.0)
MCH: 28.8 pg (ref 26.0–34.0)
MCHC: 29.4 g/dL — ABNORMAL LOW (ref 30.0–36.0)
MCV: 97.9 fL (ref 80.0–100.0)
Monocytes Absolute: 0.8 10*3/uL (ref 0.1–1.0)
Monocytes Relative: 8 %
Neutro Abs: 8.2 10*3/uL — ABNORMAL HIGH (ref 1.7–7.7)
Neutrophils Relative %: 81 %
Platelets: 337 10*3/uL (ref 150–400)
RBC: 4.31 MIL/uL (ref 4.22–5.81)
RDW: 17.2 % — ABNORMAL HIGH (ref 11.5–15.5)
WBC: 10 10*3/uL (ref 4.0–10.5)
nRBC: 0 % (ref 0.0–0.2)

## 2020-09-04 LAB — LIPASE, BLOOD: Lipase: 28 U/L (ref 11–51)

## 2020-09-04 MED ORDER — SODIUM CHLORIDE 0.9 % IV BOLUS
500.0000 mL | Freq: Once | INTRAVENOUS | Status: AC
  Administered 2020-09-04: 500 mL via INTRAVENOUS

## 2020-09-04 NOTE — ED Notes (Signed)
Got pt up to ambulate, pt did well. Reported no dizziness or weakness.

## 2020-09-04 NOTE — ED Provider Notes (Signed)
[facility] EMERGENCY DEPARTMENT Provider Note  CSN: [id number] Arrival date & time: 09/04/20 1754    History Chief Complaint  Patient presents with  . Emesis  . Hypotension    HPI  [name] Rice is a 69 y.o. male who is s/p CABG about 6 weeks ago initially had rehab at [facility] but has been home for about 2 weeks. He was recently found to have a pleural effusion and went to have it drained but his BP was too low so they gave him something to eat and some fluids but when it didn't improve he was sent home. He had his BP meds reduced recently as well. Today he was doing well, ordered a pizza for lunch but after just a slice or two he began to feel sick. He had episodes of nonbloody emesis and dry heaves. No abdominal pain initially, but some mild-moderate pain began afterwards in epigastric and periumbilical area. He had his uncle take him to his PCP office where he was again noted to be hypotensive, began to improve after 1L of IV. He was also given Zofran enroute. No pain on arrival and nausea improved.    Past Medical History:  Diagnosis Date  . Atrial fibrillation (HCC)   . Coronary artery disease   . Dysrhythmia   . Hypertension   . LBBB (left bundle branch block)     Past Surgical History:  Procedure Laterality Date  . CARDIOVERSION N/A 09/28/2016   Procedure: CARDIOVERSION;  Surgeon: Yates Decamp, MD;  Location: [facility] ENDOSCOPY;  Service: Cardiovascular;  Laterality: N/A;  . CLIPPING OF ATRIAL APPENDAGE Left 07/29/2020   Procedure: CLIPPING OF ATRIAL APPENDAGE;  Surgeon: Linden Dolin, MD;  Location: [facility] OR;  Service: Open Heart Surgery;  Laterality: Left;  . CORONARY ARTERY BYPASS GRAFT N/A 07/29/2020   Procedure: CORONARY ARTERY BYPASS GRAFTING (CABG) TIMES FOUR USING BILATERAL INTERNAL MAMMARIES AND LEFT RADIAL ARTERY;  Surgeon: Linden Dolin, MD;  Location: [facility] OR;  Service: Open Heart Surgery;  Laterality: N/A;  . LEFT HEART CATH AND CORONARY ANGIOGRAPHY N/A  07/25/2020   Procedure: LEFT HEART CATH AND CORONARY ANGIOGRAPHY;  Surgeon: Yates Decamp, MD;  Location: [facility] INVASIVE CV LAB;  Service: Cardiovascular;  Laterality: N/A;  . MAZE N/A 07/29/2020   Procedure: MAZE;  Surgeon: Linden Dolin, MD;  Location: [facility] OR;  Service: Open Heart Surgery;  Laterality: N/A;  . NO PAST SURGERIES    . RADIAL ARTERY HARVEST Left 07/29/2020   Procedure: RADIAL ARTERY HARVEST;  Surgeon: Linden Dolin, MD;  Location: [facility] OR;  Service: Open Heart Surgery;  Laterality: Left;  . TEE WITHOUT CARDIOVERSION N/A 07/29/2020   Procedure: TRANSESOPHAGEAL ECHOCARDIOGRAM (TEE);  Surgeon: Linden Dolin, MD;  Location: [facility] OR;  Service: Open Heart Surgery;  Laterality: N/A;    Family History  Problem Relation Age of Onset  . Hypertension Other     Social History   Tobacco Use  . Smoking status: Never Smoker  . Smokeless tobacco: Never Used  Vaping Use  . Vaping Use: Never used  Substance Use Topics  . Alcohol use: No  . Drug use: No     Home Medications Prior to Admission medications   Medication Sig Start Date End Date Taking? Authorizing Provider  amLODipine (NORVASC) 10 MG tablet Take 5 mg by mouth at bedtime.    Yes [provider]  Ensure (ENSURE) Take 1 Can by mouth See admin instructions. Drink 1 can of VANILLA ENSURE by mouth  once a day   Yes [provider]  loperamide (IMODIUM A-D) 2 MG tablet Take 2 mg by mouth 4 (four) times daily as needed for diarrhea or loose stools.   Yes [provider]  metoprolol tartrate (LOPRESSOR) 25 MG tablet Take 0.5 tablets (12.5 mg total) by mouth daily. Patient taking differently: Take 12.5 mg by mouth in the morning.  08/26/20 11/24/20 Yes Tolia, Sunit, DO  olmesartan (BENICAR) 40 MG tablet Take 40 mg by mouth in the morning.    Yes [provider]  rosuvastatin (CRESTOR) 40 MG tablet Take 1 tablet (40 mg total) by mouth daily. Patient taking differently: Take 40 mg by mouth at  bedtime.  08/05/20  Yes Zimmerman, Donielle M, PA-C  XARELTO 20 MG TABS tablet TAKE 1 TABLET BY MOUTH IN  THE EVENING AFTER DINNER Patient taking differently: Take 20 mg by mouth daily after supper.  04/01/20  Yes Tolia, Sunit, DO  acetaminophen (TYLENOL) 325 MG tablet Take 650 mg by mouth every 6 (six) hours as needed for mild pain.     [provider]     Allergies    Patient has no known allergies.   Review of Systems   Review of Systems A comprehensive review of systems was completed and negative except as noted in HPI.    Physical Exam BP 120/76   Pulse 87   Temp 97.7 F (36.5 C) (Oral)   Resp 20   Ht 6\' 1"  (1.854 m)   Wt 108.9 kg   SpO2 100%   BMI 31.66 kg/m   Physical Exam Vitals and nursing note reviewed.  Constitutional:      Appearance: Normal appearance.  HENT:     Head: Normocephalic and atraumatic.     Nose: Nose normal.     Mouth/Throat:     Mouth: Mucous membranes are moist.  Eyes:     Extraocular Movements: Extraocular movements intact.     Conjunctiva/sclera: Conjunctivae normal.  Cardiovascular:     Rate and Rhythm: Normal rate.  Pulmonary:     Effort: Pulmonary effort is normal.     Breath sounds: Normal breath sounds.  Abdominal:     General: Abdomen is flat. Bowel sounds are normal. There is no distension.     Palpations: Abdomen is soft.     Tenderness: There is no abdominal tenderness. There is no guarding.  Musculoskeletal:        General: No swelling. Normal range of motion.     Cervical back: Neck supple.  Skin:    General: Skin is warm and dry.  Neurological:     General: No focal deficit present.     Mental Status: He is alert.  Psychiatric:        Mood and Affect: Mood normal.      ED Results / Procedures / Treatments   Labs (all labs ordered are listed, but only abnormal results are displayed) Labs Reviewed  CBC WITH DIFFERENTIAL/PLATELET - Abnormal; Notable for the following components:      Result Value    Hemoglobin 12.4 (*)    MCHC 29.4 (*)    RDW 17.2 (*)    Neutro Abs 8.2 (*)    All other components within normal limits  COMPREHENSIVE METABOLIC PANEL - Abnormal; Notable for the following components:   Potassium 3.2 (*)    CO2 19 (*)    Glucose, Bld 159 (*)    BUN 26 (*)    Creatinine, Ser 1.35 (*)  Calcium 8.6 (*)    Albumin 2.7 (*)    AST 95 (*)    ALT 47 (*)    Alkaline Phosphatase 398 (*)    Total Bilirubin 3.7 (*)    GFR, Estimated 58 (*)    All other components within normal limits  URINALYSIS, ROUTINE W REFLEX MICROSCOPIC - Abnormal; Notable for the following components:   Color, Urine AMBER (*)    APPearance CLOUDY (*)    Bilirubin Urine SMALL (*)    Ketones, ur 20 (*)    Protein, ur 100 (*)    Bacteria, UA RARE (*)    All other components within normal limits  LIPASE, BLOOD    EKG None  Radiology US Abdomen Limited  Result Date: 09/04/2020 CLINICAL DATA:  69 year old male with epigastric pain. Elevated LFTs. EXAM: ULTRASOUND ABDOMEN LIMITED RIGHT UPPER QUADRANT COMPARISON:  Right upper quadrant ultrasound dated 07/16/2017. FINDINGS: Gallbladder: There is layering sludge or small stones within the gallbladder. No gallbladder wall thickening or pericholecystic fluid. Negative sonographic Murphy's sign. Common bile duct: Diameter: 6 mm Liver: There is diffuse increased liver echogenicity most commonly seen in the setting of fatty infiltration. Superimposed inflammation or fibrosis is not excluded. Clinical correlation is recommended. There is a 2.0 x 1.6 x 1.7 cm echogenic lesion in the anterior liver which is not characterized on this ultrasound. No internal vascularity noted within this lesion on color Doppler. This may represent an area of increased fat deposit. Repeat ultrasound in 3 months or further characterization with MRI without and with contrast on a nonemergent/outpatient basis is advised. Portal vein is patent on color Doppler imaging with normal direction  of blood flow towards the liver. Other: None. IMPRESSION: 1. Gallbladder sludge. No sonographic findings of acute cholecystitis. 2. Fatty liver. 3. Indeterminate echogenic lesion in the anterior liver as above. Follow-up recommended. Electronically Signed   By: Elgie Collard M.D.   On: 09/04/2020 20:00    Procedures Procedures  Medications Ordered in the ED Medications  sodium chloride 0.9 % bolus 500 mL (0 mLs Intravenous Stopped 09/04/20 2240)     MDM Rules/Calculators/A&P MDM Patient with vomiting and hypotension today of unclear etiology. Will check labs, give additional IVF and reassess. EKG without significant change.  ED Course  I have reviewed the triage vital signs and the nursing notes.  Pertinent labs & imaging results that were available during my care of the patient were reviewed by me and considered in my medical decision making (see chart for details).  Clinical Course as of Sep 04 2312  Thu Sep 04, 2020  1837 CBC is unremarkable.    [CS]  1904 BUN, Creatinine mildly elevated from previous may indicate some degree of dehydration. Otherwise mild elevation in LFTs above baseline. Will check [place] to eval gall bladder. He has documented history of Gilbert's but he states he has never heard of that before and doesn't know where that diagnosis came from.    [CS]  2213 Patient's [place] with some biliary sludge and fatty liver. He does not have any signs of infection. Suspect LFT abnormalities in part due to dehydration. Patient's BP is normalized now and he is feeling better.    [CS]  2219 Patient tolerating PO and able to ambulate without dizziness. He mentions to me that he has had diarrhea since before his CABG last month. Reports loose or watery stool, but only every other day or so. He was advised to follow up with GI for evaluation of his diarrhea  and to recheck his LFTs. RTED for any other concerns.    [CS]    Clinical Course User Index [CS] Pollyann SavoySheldon, Lisa Milian B, MD     Final Clinical Impression(s) / ED Diagnoses Final diagnoses:  Nausea vomiting and diarrhea  Elevated liver enzymes    Rx / DC Orders ED Discharge Orders    None       Pollyann SavoySheldon, Bruno Leach B, MD 09/04/20 2313

## 2020-09-04 NOTE — ED Triage Notes (Signed)
Pt from PCP office for further evaluation of emesis and diarrhea since waking up this morning, pt went to sleep last night feeling fine. BP initially 88 palpated, given fluid and BP went down to 80/46, pt given another fluid bolus, BP 92/48. Pt also given 8mg  zofran IV en route. Pt arrives to ED alert, denies pain, dry heaving.

## 2020-09-04 NOTE — ED Notes (Signed)
Patient verbalizes understanding of discharge instructions. Opportunity for questioning and answers were provided. Armband removed by staff, pt discharged from ED via wheelchair.  

## 2020-09-07 ENCOUNTER — Other Ambulatory Visit: Payer: Self-pay | Admitting: Adult Health

## 2020-09-07 DIAGNOSIS — I2510 Atherosclerotic heart disease of native coronary artery without angina pectoris: Secondary | ICD-10-CM

## 2020-09-07 DIAGNOSIS — I4821 Permanent atrial fibrillation: Secondary | ICD-10-CM

## 2020-09-14 ENCOUNTER — Emergency Department (HOSPITAL_BASED_OUTPATIENT_CLINIC_OR_DEPARTMENT_OTHER): Payer: Medicare Other

## 2020-09-14 ENCOUNTER — Other Ambulatory Visit: Payer: Self-pay

## 2020-09-14 ENCOUNTER — Inpatient Hospital Stay (HOSPITAL_BASED_OUTPATIENT_CLINIC_OR_DEPARTMENT_OTHER)
Admission: EM | Admit: 2020-09-14 | Discharge: 2020-09-22 | DRG: 853 | Disposition: A | Discharge status: Home Health | Payer: Medicare Other | Attending: Family Medicine | Admitting: Family Medicine

## 2020-09-14 ENCOUNTER — Encounter (HOSPITAL_BASED_OUTPATIENT_CLINIC_OR_DEPARTMENT_OTHER): Payer: Self-pay | Admitting: Emergency Medicine

## 2020-09-14 DIAGNOSIS — I10 Essential (primary) hypertension: Secondary | ICD-10-CM

## 2020-09-14 DIAGNOSIS — I482 Chronic atrial fibrillation, unspecified: Secondary | ICD-10-CM | POA: Diagnosis present

## 2020-09-14 DIAGNOSIS — Z6831 Body mass index (BMI) 31.0-31.9, adult: Secondary | ICD-10-CM

## 2020-09-14 DIAGNOSIS — R748 Abnormal levels of other serum enzymes: Secondary | ICD-10-CM | POA: Diagnosis not present

## 2020-09-14 DIAGNOSIS — I4821 Permanent atrial fibrillation: Secondary | ICD-10-CM | POA: Diagnosis not present

## 2020-09-14 DIAGNOSIS — K529 Noninfective gastroenteritis and colitis, unspecified: Secondary | ICD-10-CM | POA: Diagnosis present

## 2020-09-14 DIAGNOSIS — I4819 Other persistent atrial fibrillation: Secondary | ICD-10-CM | POA: Diagnosis present

## 2020-09-14 DIAGNOSIS — I6522 Occlusion and stenosis of left carotid artery: Secondary | ICD-10-CM | POA: Diagnosis present

## 2020-09-14 DIAGNOSIS — K831 Obstruction of bile duct: Secondary | ICD-10-CM | POA: Diagnosis not present

## 2020-09-14 DIAGNOSIS — K8051 Calculus of bile duct without cholangitis or cholecystitis with obstruction: Secondary | ICD-10-CM | POA: Diagnosis not present

## 2020-09-14 DIAGNOSIS — Z79899 Other long term (current) drug therapy: Secondary | ICD-10-CM | POA: Diagnosis not present

## 2020-09-14 DIAGNOSIS — K810 Acute cholecystitis: Secondary | ICD-10-CM | POA: Diagnosis not present

## 2020-09-14 DIAGNOSIS — E7800 Pure hypercholesterolemia, unspecified: Secondary | ICD-10-CM | POA: Diagnosis present

## 2020-09-14 DIAGNOSIS — E860 Dehydration: Secondary | ICD-10-CM | POA: Diagnosis present

## 2020-09-14 DIAGNOSIS — K8067 Calculus of gallbladder and bile duct with acute and chronic cholecystitis with obstruction: Secondary | ICD-10-CM | POA: Diagnosis present

## 2020-09-14 DIAGNOSIS — R197 Diarrhea, unspecified: Secondary | ICD-10-CM | POA: Diagnosis present

## 2020-09-14 DIAGNOSIS — I4891 Unspecified atrial fibrillation: Secondary | ICD-10-CM | POA: Diagnosis present

## 2020-09-14 DIAGNOSIS — I2510 Atherosclerotic heart disease of native coronary artery without angina pectoris: Secondary | ICD-10-CM | POA: Diagnosis present

## 2020-09-14 DIAGNOSIS — E669 Obesity, unspecified: Secondary | ICD-10-CM | POA: Diagnosis present

## 2020-09-14 DIAGNOSIS — Z20822 Contact with and (suspected) exposure to covid-19: Secondary | ICD-10-CM | POA: Diagnosis present

## 2020-09-14 DIAGNOSIS — Z8249 Family history of ischemic heart disease and other diseases of the circulatory system: Secondary | ICD-10-CM

## 2020-09-14 DIAGNOSIS — A419 Sepsis, unspecified organism: Secondary | ICD-10-CM | POA: Diagnosis present

## 2020-09-14 DIAGNOSIS — E785 Hyperlipidemia, unspecified: Secondary | ICD-10-CM | POA: Diagnosis present

## 2020-09-14 DIAGNOSIS — Z951 Presence of aortocoronary bypass graft: Secondary | ICD-10-CM | POA: Diagnosis not present

## 2020-09-14 DIAGNOSIS — K8042 Calculus of bile duct with acute cholecystitis without obstruction: Secondary | ICD-10-CM

## 2020-09-14 DIAGNOSIS — R55 Syncope and collapse: Secondary | ICD-10-CM

## 2020-09-14 DIAGNOSIS — E876 Hypokalemia: Secondary | ICD-10-CM | POA: Diagnosis present

## 2020-09-14 DIAGNOSIS — N179 Acute kidney failure, unspecified: Secondary | ICD-10-CM | POA: Diagnosis present

## 2020-09-14 DIAGNOSIS — I447 Left bundle-branch block, unspecified: Secondary | ICD-10-CM | POA: Diagnosis present

## 2020-09-14 DIAGNOSIS — R1011 Right upper quadrant pain: Secondary | ICD-10-CM | POA: Diagnosis not present

## 2020-09-14 DIAGNOSIS — Z7901 Long term (current) use of anticoagulants: Secondary | ICD-10-CM | POA: Diagnosis not present

## 2020-09-14 DIAGNOSIS — R7989 Other specified abnormal findings of blood chemistry: Secondary | ICD-10-CM | POA: Diagnosis present

## 2020-09-14 DIAGNOSIS — R109 Unspecified abdominal pain: Secondary | ICD-10-CM

## 2020-09-14 DIAGNOSIS — R935 Abnormal findings on diagnostic imaging of other abdominal regions, including retroperitoneum: Secondary | ICD-10-CM

## 2020-09-14 DIAGNOSIS — R42 Dizziness and giddiness: Secondary | ICD-10-CM

## 2020-09-14 DIAGNOSIS — R933 Abnormal findings on diagnostic imaging of other parts of digestive tract: Secondary | ICD-10-CM | POA: Diagnosis not present

## 2020-09-14 LAB — COMPREHENSIVE METABOLIC PANEL
ALT: 78 U/L — ABNORMAL HIGH (ref 0–44)
AST: 105 U/L — ABNORMAL HIGH (ref 15–41)
Albumin: 2.5 g/dL — ABNORMAL LOW (ref 3.5–5.0)
Alkaline Phosphatase: 796 U/L — ABNORMAL HIGH (ref 38–126)
Anion gap: 16 — ABNORMAL HIGH (ref 5–15)
BUN: 15 mg/dL (ref 8–23)
CO2: 23 mmol/L (ref 22–32)
Calcium: 8.3 mg/dL — ABNORMAL LOW (ref 8.9–10.3)
Chloride: 97 mmol/L — ABNORMAL LOW (ref 98–111)
Creatinine, Ser: 1.36 mg/dL — ABNORMAL HIGH (ref 0.61–1.24)
GFR, Estimated: 57 mL/min — ABNORMAL LOW (ref >60)
Glucose, Bld: 103 mg/dL — ABNORMAL HIGH (ref 70–99)
Potassium: 3.1 mmol/L — ABNORMAL LOW (ref 3.5–5.1)
Sodium: 136 mmol/L (ref 135–145)
Total Bilirubin: 1.6 mg/dL — ABNORMAL HIGH (ref 0.3–1.2)
Total Protein: 6.9 g/dL (ref 6.5–8.1)

## 2020-09-14 LAB — URINALYSIS, MICROSCOPIC (REFLEX): RBC / HPF: >50 RBC/hpf (ref 0–5)

## 2020-09-14 LAB — URINALYSIS, ROUTINE W REFLEX MICROSCOPIC
Glucose, UA: NEGATIVE mg/dL
Ketones, ur: 15 mg/dL — AB
Leukocytes,Ua: NEGATIVE
Nitrite: NEGATIVE
Protein, ur: 30 mg/dL — AB
Specific Gravity, Urine: <1.005 — ABNORMAL LOW (ref 1.005–1.030)
pH: 5.5 (ref 5.0–8.0)

## 2020-09-14 LAB — CBC
HCT: 42.5 % (ref 39.0–52.0)
Hemoglobin: 13.1 g/dL (ref 13.0–17.0)
MCH: 28.1 pg (ref 26.0–34.0)
MCHC: 30.8 g/dL (ref 30.0–36.0)
MCV: 91.2 fL (ref 80.0–100.0)
Platelets: 486 10*3/uL — ABNORMAL HIGH (ref 150–400)
RBC: 4.66 MIL/uL (ref 4.22–5.81)
RDW: 16.7 % — ABNORMAL HIGH (ref 11.5–15.5)
WBC: 13.4 10*3/uL — ABNORMAL HIGH (ref 4.0–10.5)
nRBC: 0 % (ref 0.0–0.2)

## 2020-09-14 LAB — C DIFFICILE QUICK SCREEN W PCR REFLEX
C Diff antigen: NEGATIVE
C Diff interpretation: NOT DETECTED
C Diff toxin: NEGATIVE

## 2020-09-14 LAB — LIPASE, BLOOD: Lipase: 36 U/L (ref 11–51)

## 2020-09-14 LAB — RESPIRATORY PANEL BY RT PCR (FLU A&B, COVID)
Influenza A by PCR: NEGATIVE
Influenza B by PCR: NEGATIVE
SARS Coronavirus 2 by RT PCR: NEGATIVE

## 2020-09-14 LAB — LACTIC ACID, PLASMA: Lactic Acid, Venous: 1.3 mmol/L (ref 0.5–1.9)

## 2020-09-14 MED ORDER — PIPERACILLIN-TAZOBACTAM 3.375 G IVPB 30 MIN
3.3750 g | Freq: Once | INTRAVENOUS | Status: AC
  Administered 2020-09-14: 3.375 g via INTRAVENOUS
  Filled 2020-09-14: qty 50

## 2020-09-14 MED ORDER — ONDANSETRON HCL 4 MG/2ML IJ SOLN: 4.0000 mg | Freq: Four times a day (QID) | INTRAMUSCULAR | Status: DC | PRN

## 2020-09-14 MED ORDER — PIPERACILLIN-TAZOBACTAM 3.375 G IVPB
3.3750 g | Freq: Three times a day (TID) | INTRAVENOUS | Status: DC
  Administered 2020-09-14 – 2020-09-22 (×22): 3.375 g via INTRAVENOUS
  Filled 2020-09-14 (×23): qty 50

## 2020-09-14 MED ORDER — POTASSIUM CHLORIDE 10 MEQ/100ML IV SOLN
10.0000 meq | Freq: Once | INTRAVENOUS | Status: AC
  Administered 2020-09-14: 10 meq via INTRAVENOUS
  Filled 2020-09-14: qty 100

## 2020-09-14 MED ORDER — SODIUM CHLORIDE 0.9 % IV BOLUS
1000.0000 mL | Freq: Once | INTRAVENOUS | Status: AC
  Administered 2020-09-14: 1000 mL via INTRAVENOUS

## 2020-09-14 MED ORDER — SODIUM CHLORIDE 0.9 % IV SOLN: INTRAVENOUS | Status: DC

## 2020-09-14 MED ORDER — POTASSIUM CHLORIDE CRYS ER 20 MEQ PO TBCR: 20.0000 meq | EXTENDED_RELEASE_TABLET | Freq: Two times a day (BID) | ORAL | 0 refills | Status: DC

## 2020-09-14 MED ORDER — POTASSIUM CHLORIDE CRYS ER 20 MEQ PO TBCR
40.0000 meq | EXTENDED_RELEASE_TABLET | Freq: Once | ORAL | Status: AC
  Administered 2020-09-14: 40 meq via ORAL
  Filled 2020-09-14: qty 2

## 2020-09-14 MED ORDER — IOHEXOL 300 MG/ML  SOLN
100.0000 mL | Freq: Once | INTRAMUSCULAR | Status: AC
  Administered 2020-09-14: 100 mL via INTRAVENOUS

## 2020-09-14 NOTE — ED Notes (Signed)
I have just phoned report to Tacey Ruiz, Charity fundraiser at [facility]. [facility] leaves with him at this time without issue. Pt. Continues to be comfortable and in no distress.

## 2020-09-14 NOTE — ED Provider Notes (Addendum)
[facility] EMERGENCY DEPARTMENT Provider Note   CSN: [id number] Arrival date & time: 09/14/20  1108     History Chief Complaint  Patient presents with  . Diarrhea    [name] Rice is a 69 y.o. male.  Patient status post coronary bypass surgery and September.  Patient was admitted September 15 through September 28.  Had four-vessel bypass.  Patient last seen in the emergency department at [facility] on October 28.  For very similar complaint is today.  The diarrhea.  Patient underwent ultrasound of the abdomen and had some abnormal LFTs.  But there was no acute findings.  Patient states that he has had diarrhea since he was in rehab following CABG.  Patient's friend and neighbor states that he is probably been having diarrhea since August.  So there is some mental recall issues.  Patient denies any abdominal pain.  States is watery diarrhea.  No blood in it.  Patient had to get IV fluids when he was seen on the 28th.  Potassium was a little low at that time.  Patient was referred to GI medicine but he was confused about that so he has not called for follow-up yet but it now understands that they intended for him to be seen.  Was [facility].  Patient not followed by gastroenterology in the past.  Patient arrived with low blood pressures.  That was similar to when he was seen in October.  Patient given a liter of fluid blood pressures now up into the 120s.  Blood pressures were in the low 90s.  Patient has a history of atrial fibrillation and left bundle branch block.  In addition because of the low blood pressures patient has stopped all of his hypertensive medications.  Also very concerned about C. difficile.  Wants to be tested for that        Past Medical History:  Diagnosis Date  . Atrial fibrillation ([place])   . Coronary artery disease   . Dysrhythmia   . Hypertension   . LBBB (left bundle branch block)     Patient Active Problem List   Diagnosis Date Noted  . Malnutrition  of moderate degree ([place]) 08/07/2020  . Acute blood loss anemia 08/07/2020  . CAD, multiple vessel 08/07/2020  . 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 Postural dizziness with presyncope 07/24/2020  . Hypokalemia 07/24/2020  . Diarrhea 07/24/2020  . Lightheadedness 07/24/2020  . Long term current use of anticoagulant 01/14/2020  . Benign hypertension with CKD (chronic kidney disease) stage III 06/12/2019  . Pure hypercholesterolemia 06/12/2019  . Atrial fibrillation ([place]) 09/25/2016    Past Surgical History:  Procedure Laterality Date  . CARDIOVERSION N/A 09/28/2016   Procedure: CARDIOVERSION;  Surgeon: Adrian Prows, MD;  Location: [place];  Service: Cardiovascular;  Laterality: N/A;  . CLIPPING OF ATRIAL APPENDAGE Left 07/29/2020   Procedure: CLIPPING OF ATRIAL APPENDAGE;  Surgeon: Wonda Olds, MD;  Location: [place];  Service: Open Heart Surgery;  Laterality: Left;  . CORONARY ARTERY BYPASS GRAFT N/A 07/29/2020   Procedure: CORONARY ARTERY BYPASS GRAFTING (CABG) TIMES FOUR USING BILATERAL INTERNAL MAMMARIES AND LEFT RADIAL ARTERY;  Surgeon: Wonda Olds, MD;  Location: [place];  Service: Open Heart Surgery;  Laterality: N/A;  . LEFT HEART CATH AND CORONARY ANGIOGRAPHY N/A 07/25/2020   Procedure: LEFT HEART CATH AND CORONARY ANGIOGRAPHY;  Surgeon: Adrian Prows, MD;  Location: [place] CV [facility];  Service: Cardiovascular;  Laterality: N/A;  . MAZE N/A 07/29/2020   Procedure:  MAZE;  Surgeon: Wonda Olds, MD;  Location: [place];  Service: Open Heart Surgery;  Laterality: N/A;  . NO PAST SURGERIES    . RADIAL ARTERY HARVEST Left 07/29/2020   Procedure: RADIAL ARTERY HARVEST;  Surgeon: Wonda Olds, MD;  Location: [facility];  Service: Open Heart Surgery;  Laterality: Left;  . TEE WITHOUT CARDIOVERSION N/A 07/29/2020   Procedure: TRANSESOPHAGEAL ECHOCARDIOGRAM (TEE);  Surgeon: Wonda Olds, MD;  Location: [place];  Service: Open Heart Surgery;  Laterality: N/A;       Family History    Problem Relation Age of Onset  . Hypertension Other     Social History   Tobacco Use  . Smoking status: Never Smoker  . Smokeless tobacco: Never Used  Vaping Use  . Vaping Use: Never used  Substance Use Topics  . Alcohol use: No  . Drug use: No    Home Medications Prior to Admission medications   Medication Sig Start Date End Date Taking? Authorizing Provider  acetaminophen (TYLENOL) 325 MG tablet Take 650 mg by mouth every 6 (six) hours as needed for mild pain.     [provider]  amLODipine (NORVASC) 10 MG tablet Take 5 mg by mouth at bedtime.     [provider]  Ensure (ENSURE) Take 1 Can by mouth See admin instructions. Drink 1 can of VANILLA ENSURE by mouth once a day    [provider]  loperamide (IMODIUM A-D) 2 MG tablet Take 2 mg by mouth 4 (four) times daily as needed for diarrhea or loose stools.    [provider]  metoprolol tartrate (LOPRESSOR) 25 MG tablet Take 0.5 tablets (12.5 mg total) by mouth daily. Patient taking differently: Take 12.5 mg by mouth in the morning.  08/26/20 11/24/20  Tolia, Sunit, DO  olmesartan (BENICAR) 40 MG tablet Take 40 mg by mouth in the morning.     [provider]  potassium chloride SA (KLOR-CON) 20 MEQ tablet Take 1 tablet (20 mEq total) by mouth 2 (two) times daily. 09/14/20   Fredia Sorrow, MD  rosuvastatin (CRESTOR) 40 MG tablet Take 1 tablet (40 mg total) by mouth daily. Patient taking differently: Take 40 mg by mouth at bedtime.  08/05/20   Tacy Dura, Donielle M, PA-C  XARELTO 20 MG TABS tablet TAKE 1 TABLET BY MOUTH IN  THE EVENING AFTER DINNER Patient taking differently: Take 20 mg by mouth daily after supper.  04/01/20   Rex Kras, DO    Allergies    Patient has no known allergies.  Review of Systems   Review of Systems  Constitutional: Positive for fatigue. Negative for chills and fever.  HENT: Negative for congestion, rhinorrhea and sore throat.   Eyes: Negative for  visual disturbance.  Respiratory: Negative for cough and shortness of breath.   Cardiovascular: Negative for chest pain and leg swelling.  Gastrointestinal: Positive for diarrhea. Negative for abdominal pain, nausea and vomiting.  Genitourinary: Negative for dysuria.  Musculoskeletal: Negative for back pain and neck pain.  Skin: Negative for rash.  Neurological: Negative for dizziness, light-headedness and headaches.  Hematological: Does not bruise/bleed easily.  Psychiatric/Behavioral: Negative for confusion.    Physical Exam Updated Vital Signs BP (!) 91/56   Pulse 97   Temp 97.6 F (36.4 C) (Oral)   Resp (!) 21   Ht 1.88 m ('6\' 2"' )   Wt 104.3 kg   SpO2 100%   BMI 29.53 kg/m   Physical Exam Vitals and nursing note  reviewed.  Constitutional:      General: He is not in acute distress.    Appearance: Normal appearance. He is well-developed.  HENT:     Head: Normocephalic and atraumatic.  Eyes:     Extraocular Movements: Extraocular movements intact.     Conjunctiva/sclera: Conjunctivae normal.     Pupils: Pupils are equal, round, and reactive to light.  Cardiovascular:     Rate and Rhythm: Normal rate and regular rhythm.     Heart sounds: No murmur heard.   Pulmonary:     Effort: Pulmonary effort is normal. No respiratory distress.     Breath sounds: Normal breath sounds.  Abdominal:     General: There is no distension.     Palpations: Abdomen is soft.     Tenderness: There is no abdominal tenderness.  Musculoskeletal:        General: Normal range of motion.     Cervical back: Normal range of motion and neck supple.  Skin:    General: Skin is warm and dry.  Neurological:     General: No focal deficit present.     Mental Status: He is alert. Mental status is at baseline.     Cranial Nerves: No cranial nerve deficit.     Sensory: No sensory deficit.     Motor: No weakness.     Comments: Patient may have some mild mental deficit.  Seems to have some recollection  of when things started to be off according to neighbor.     ED Results / Procedures / Treatments   Labs (all labs ordered are listed, but only abnormal results are displayed) Labs Reviewed  COMPREHENSIVE METABOLIC PANEL - Abnormal; Notable for the following components:      Result Value   Potassium 3.1 (*)    Chloride 97 (*)    Glucose, Bld 103 (*)    Creatinine, Ser 1.36 (*)    Calcium 8.3 (*)    Albumin 2.5 (*)    AST 105 (*)    ALT 78 (*)    Alkaline Phosphatase 796 (*)    Total Bilirubin 1.6 (*)    GFR, Estimated 57 (*)    Anion gap 16 (*)    All other components within normal limits  CBC - Abnormal; Notable for the following components:   WBC 13.4 (*)    RDW 16.7 (*)    Platelets 486 (*)    All other components within normal limits  C DIFFICILE QUICK SCREEN W PCR REFLEX  RESPIRATORY PANEL BY RT PCR (FLU A&B, COVID)  LIPASE, BLOOD  URINALYSIS, ROUTINE W REFLEX MICROSCOPIC    EKG EKG Interpretation  Date/Time:  Sunday September 14 2020 14:51:34 EST Ventricular Rate:  89 PR Interval:    QRS Duration: 157 QT Interval:  439 QTC Calculation: 535 R Axis:   91 Text Interpretation: Atrial fibrillation Nonspecific intraventricular conduction delay Probable lateral infarct, age indeterminate Left bundle branch block No significant change since last tracing Confirmed by Fredia Sorrow [id number]) on 09/14/2020 2:59:55 PM   Radiology CT Abdomen Pelvis W Contrast  Result Date: 09/14/2020 CLINICAL DATA:  Diarrhea after surgery, fatigue, weakness, history atrial fibrillation, coronary artery disease, hypertension, post CABG and Maze procedure on 07/29/2020 EXAM: CT ABDOMEN AND PELVIS WITH CONTRAST TECHNIQUE: Multidetector CT imaging of the abdomen and pelvis was performed using the standard protocol following bolus administration of intravenous contrast. Sagittal and coronal MPR images reconstructed from axial data set. CONTRAST:  156m OMNIPAQUE IOHEXOL 300 MG/ML SOLN  IV. No  oral contrast. COMPARISON:  07/16/2017 FINDINGS: Lower chest: Small to moderate LEFT pleural effusion with partial atelectasis of LEFT lower lobe. Minimal RIGHT pleural effusion and basilar atelectasis dependently. Calcified granuloma RIGHT lower lobe. Hepatobiliary: Distended gallbladder with wall thickening and pericholecystic infiltrative changes concerning for acute cholecystitis. No definite calcified gallstones. Dilated CBD up to 19 mm diameter to ampulla. No definite calcification at duodenal junction though questionable wall thickening at the ampulla is present. Intrahepatic biliary dilatation present. No focal hepatic mass lesion. Pancreas: No focal abnormalities Spleen: Normal appearance Adrenals/Urinary Tract: 5 mm calculus at central LEFT kidney. Cortical scarring LEFT kidney. Adrenal glands, kidneys, and ureters otherwise normal appearance. Bladder contains small amount of urine and extends into a RIGHT inguinal hernia. Stomach/Bowel: Minimal colonic diverticulosis without evidence of diverticulitis. Normal appendix. Stomach decompressed containing radiodense material question medication. Remaining bowel loops unremarkable. Vascular/Lymphatic: Atherosclerotic calcifications aorta and iliac arteries without aneurysm. No adenopathy Reproductive: Unremarkable prostate gland and seminal vesicles. Other: RIGHT inguinal hernia containing fat and portion of the urinary bladder. No bowel herniation. No additional hernia is identified. No free air or free fluid. Musculoskeletal: Osseous demineralization. IMPRESSION: Distended gallbladder with wall thickening and pericholecystic infiltrative changes concerning for acute cholecystitis. Dilated CBD up to 19 mm diameter with intrahepatic biliary dilatation, recommend correlation with LFTs; cause of biliary dilatation is uncertain, without calcification evident, could be due to noncalcified stone, mass, or stricture. This could be assessed by MRCP imaging for further  evaluation. RIGHT inguinal hernia containing fat and a portion of the urinary bladder. 5 minutes mm nonobstructing LEFT renal calculus. Small to moderate LEFT and minimal RIGHT pleural effusions with bibasilar atelectasis greater on LEFT. Minimal colonic diverticulosis without evidence of diverticulitis. Aortic Atherosclerosis (ICD10-I70.0). Electronically Signed   By: Lavonia Dana M.D.   On: 09/14/2020 15:17    Procedures Procedures (including critical care time)  Medications Ordered in ED Medications  0.9 %  sodium chloride infusion ( Intravenous New Bag/Given 09/14/20 1409)  potassium chloride 10 mEq in 100 mL IVPB (has no administration in time range)  sodium chloride 0.9 % bolus 1,000 mL (0 mLs Intravenous Stopped 09/14/20 1318)  potassium chloride 10 mEq in 100 mL IVPB (10 mEq Intravenous New Bag/Given 09/14/20 1411)  potassium chloride SA (KLOR-CON) CR tablet 40 mEq (40 mEq Oral Given 09/14/20 1410)  iohexol (OMNIPAQUE) 300 MG/ML solution 100 mL (100 mLs Intravenous Contrast Given 09/14/20 1420)    ED Course  I have reviewed the triage vital signs and the nursing notes.  Pertinent labs & imaging results that were available during my care of the patient were reviewed by me and considered in my medical decision making (see chart for details).    MDM Rules/Calculators/A&P                          Patient's work-up here significant for a potassium of 3.1.  Little bit lower than when he was seen on 28 October.  Will Goeden give some IV potassium tomorrow Pat potassium may need some supplementation.  Some mild renal insufficiency.  Creatinine 1.36.  Marked liver function test abnormalities with elevation of AST ALT alk phos and total bili.  Alk phos is 796 total bili 1.6.  This part of the reason probably why they did the ultrasound with the visit on October.  Ultrasound did show some sludge.  Patient is nontender to palpation in the right upper quadrant.  Patient is very concerned about  having C. difficile.  So have ordered that.  Based on the frequency of his bowel movements we should be get a stool sample.  In addition we will go ahead and get CT abdomen pelvis for further evaluation.  Patient's lipase is normal here today.  Blood pressure improved with 1 L fluid.  We will continue IV hydration at 75 cc an hour.  Patient will need follow-up with gastroenterology also need follow-up for the low potassium.  Would recommend potassium supplementation at home.  Patient will need follow-up with primary care doctor for that as well.  CT scan further confirms a significantly abnormal liver function test with markedly elevated alk phos bilirubin.  Also has some leukocytosis.  Patient without any significant tenderness right upper quadrant.  But CT scan consistent with acute cholecystitis.  Will start Zosyn as an antibiotic patient has no known allergies.  No evidence of any colitis or enteritis.  Due to the biliary obstruction will need to discuss with GI medicine already discussed with general surgery probably hospitalist admission at [facility] long.  With concern for probable need of ERCP.  Discussed with hospitalist at [facility].  He can admit him to telemetry.  Call still pending from GI medicine.  Gust with general surgery Dr. Marlou Starks.  They are aware of the patient.  And available for consultation after GI medicine decides what they want to do.     Final Clinical Impression(s) / ED Diagnoses Final diagnoses:  Diarrhea, unspecified type  Hypokalemia  Acute cholecystitis    Rx / DC Orders ED Discharge Orders         Ordered    potassium chloride SA (KLOR-CON) 20 MEQ tablet  2 times daily        09/14/20 1509           Fredia Sorrow, MD 09/14/20 1539    Fredia Sorrow, MD 09/14/20 1542    Fredia Sorrow, MD 09/14/20 1559

## 2020-09-14 NOTE — ED Triage Notes (Signed)
States he has had diarrhea x 2 months. He wants to be tested for c diff. States he has been seen multiple times with no relief. C/o being very weak. Denies pain.

## 2020-09-14 NOTE — Progress Notes (Signed)
Brief note regarding plan, with full H&P to follow:  [name] Rice is a 69 year old male with recent history of four-vessel CABG in September 2021, who is admitted to [facility] on September 14, 2020 as transfer from [facility] emergency department for further evaluation of suspected acute cholecystitis.  Gastroenterology consulted and planning to evaluate the patient tonight for evaluation of ERCP versus MRCP. GI notified of patient's arrival at [facility]. For now, the patient is n.p.o. and on Zosyn in the context of concern for underlying acute cholecystitis. Of note, he received 60 mEq of potassium chloride prior to transfer from med [facility] emergency department for presenting hypokalemia. Add on serum magnesium level has been ordered, with result currently pending. On normal saline at 125 cc/h.    [name], DO Hospitalist

## 2020-09-14 NOTE — ED Notes (Signed)
He has been, and continues to be awake, alert and oriented x 4 with clear speech. He tells me he just feels "kinda weak" and denies pain.

## 2020-09-14 NOTE — Plan of Care (Signed)
From [facility] for transfer for admission by Dr. Deretha Emory  Patient is a 69 year old Caucasian male with past medical history of recent CABG in September for a four-vessel bypass who presents with a chief complaint of diarrhea as well as abdominal pain.  He had similar symptoms on October 20 when he was seen at [facility], ER.  He had a pending ultrasound of the abdomen at that time and was discharged home and comes in again with similar complaints.  Patient's parent has stated that he has had diarrhea since August of this year.  He presented with abdominal pain and diarrhea and further work-up revealed acute cholecystitis and elevated bilirubin and LFTs.  General surgery was consulted and requested GI see the patient for MRCP given possible choledocholithiasis.  If is on IV Zosyn given a 1 L normal saline bolus.  Currently he is hemodynamically stable and his blood pressure was less 126/63.  On CT scan his common bile duct was significantly dilated.  GI will see this patient when he arrives and will determine whether he needs surgical intervention or not.  GI to likely do an ERCP and MRCP.  His potassium was also low and this is currently being repleted.  Currently patient has been vaccinated for Covid and Covid testing is still pending.  Accepted to telemetry bed and will have GI follow-up and general surgery follow-up.

## 2020-09-14 NOTE — ED Provider Notes (Signed)
4:21 PM Discussed with Chales Abrahams, who will consult on patient at [facility].   5:14 PM Patient is noted to have BP in 80s. While he clinically looks well and states he feels well, will add 2nd IV fluid bolus and add lactate and cultures.   5:42 PM BP is 120/90. He appears stable for transfer.    Pricilla Loveless, MD 09/14/20 1742

## 2020-09-14 NOTE — ED Notes (Signed)
He stands with minimal assistance to pass a thick liquid diarrhea stool, into which he also voids. He denies dizziness, nor any sensation of impending syncope at any time.

## 2020-09-14 NOTE — ED Notes (Signed)
Patient transported to CT 

## 2020-09-14 NOTE — Plan of Care (Signed)
  Problem: Clinical Measurements: Goal: Will remain free from infection Outcome: Progressing Goal: Diagnostic test results will improve Outcome: Progressing   Problem: Activity: Goal: Risk for activity intolerance will decrease Outcome: Progressing   Problem: Nutrition: Goal: Adequate nutrition will be maintained Outcome: Progressing   Problem: Elimination: Goal: Will not experience complications related to bowel motility Outcome: Progressing Goal: Will not experience complications related to urinary retention Outcome: Progressing   Problem: Safety: Goal: Ability to remain free from injury will improve Outcome: Progressing   Problem: Skin Integrity: Goal: Risk for impaired skin integrity will decrease Outcome: Progressing

## 2020-09-14 NOTE — Progress Notes (Signed)
Pharmacy Antibiotic Note  [name] Rice is a 69 y.o. male presented to the ED on 09/14/2020 with c/o diarrhea for the past two months.  Abdominal CT on 11/7 showed findings consistent with acute cholecystitis. Pharmacy is consulted to start zosyn for intra-abdominal infection.  Plan: - zosyn 3.375 gm IV q8h (infuse over 4 hrs)  _____________________________________  Height: 6\' 2"  (188 cm) Weight: 104.3 kg (230 lb) IBW/kg (Calculated) : 82.2  Temp (24hrs), Avg:97.7 F (36.5 C), Min:97.6 F (36.4 C), Max:97.8 F (36.6 C)  Recent Labs  Lab 09/14/20 1240 09/14/20 1725  WBC 13.4*  --   CREATININE 1.36*  --   LATICACIDVEN  --  1.3    Estimated Creatinine Clearance: 67.8 mL/min (A) (by C-G formula based on SCr of 1.36 mg/dL (H)).    No Known Allergies   Thank you for allowing pharmacy to be a part of this patient's care.  13/07/21 09/14/2020 8:50 PM

## 2020-09-14 NOTE — Consult Note (Signed)
Reason for Consult:CT findings Referring Physician: Dr. Letta Median is an 69 y.o. male.  HPI: The patient is a 69 year old white male who has a history of 5 vessel coronary bypass about 2 months ago.  Since the time of surgery he has been having fairly significant diarrhea.  He denies any abdominal pain.  He denies any nausea or vomiting.  He came to the emergency department where a CT scan showed what appeared to be a thick walled gallbladder with some pericholecystic fluid.  His liver functions have been elevated.  He is on Xarelto and took this medicine earlier today.  Past Medical History:  Diagnosis Date  . Atrial fibrillation (HCC)   . Coronary artery disease   . Dysrhythmia   . Hypertension   . LBBB (left bundle branch block)     Past Surgical History:  Procedure Laterality Date  . CARDIOVERSION N/A 09/28/2016   Procedure: CARDIOVERSION;  Surgeon: Yates Decamp, MD;  Location: [facility] ENDOSCOPY;  Service: Cardiovascular;  Laterality: N/A;  . CLIPPING OF ATRIAL APPENDAGE Left 07/29/2020   Procedure: CLIPPING OF ATRIAL APPENDAGE;  Surgeon: Linden Dolin, MD;  Location: [facility] OR;  Service: Open Heart Surgery;  Laterality: Left;  . CORONARY ARTERY BYPASS GRAFT N/A 07/29/2020   Procedure: CORONARY ARTERY BYPASS GRAFTING (CABG) TIMES FOUR USING BILATERAL INTERNAL MAMMARIES AND LEFT RADIAL ARTERY;  Surgeon: Linden Dolin, MD;  Location: [facility] OR;  Service: Open Heart Surgery;  Laterality: N/A;  . LEFT HEART CATH AND CORONARY ANGIOGRAPHY N/A 07/25/2020   Procedure: LEFT HEART CATH AND CORONARY ANGIOGRAPHY;  Surgeon: Yates Decamp, MD;  Location: [facility] INVASIVE CV LAB;  Service: Cardiovascular;  Laterality: N/A;  . MAZE N/A 07/29/2020   Procedure: MAZE;  Surgeon: Linden Dolin, MD;  Location: [facility] OR;  Service: Open Heart Surgery;  Laterality: N/A;  . NO PAST SURGERIES    . RADIAL ARTERY HARVEST Left 07/29/2020   Procedure: RADIAL ARTERY HARVEST;  Surgeon: Linden Dolin, MD;  Location: [facility]  OR;  Service: Open Heart Surgery;  Laterality: Left;  . TEE WITHOUT CARDIOVERSION N/A 07/29/2020   Procedure: TRANSESOPHAGEAL ECHOCARDIOGRAM (TEE);  Surgeon: Linden Dolin, MD;  Location: [facility] OR;  Service: Open Heart Surgery;  Laterality: N/A;    Family History  Problem Relation Age of Onset  . Hypertension Other     Social History:  reports that he has never smoked. He has never used smokeless tobacco. He reports that he does not drink alcohol and does not use drugs.  Allergies: No Known Allergies  Medications: I have reviewed the patient's current medications.  Results for orders placed or performed during the hospital encounter of 09/14/20 (from the past 48 hour(s))  Lipase, blood     Status: None   Collection Time: 09/14/20 12:40 PM  Result Value Ref Range   Lipase 36 11 - 51 U/L    Comment: Performed at [facility], [address] [place], [place] [address]  Comprehensive metabolic panel     Status: Abnormal   Collection Time: 09/14/20 12:40 PM  Result Value Ref Range   Sodium 136 135 - 145 mmol/L   Potassium 3.1 (L) 3.5 - 5.1 mmol/L   Chloride 97 (L) 98 - 111 mmol/L   CO2 23 22 - 32 mmol/L   Glucose, Bld 103 (H) 70 - 99 mg/dL    Comment: Glucose reference range applies only to samples taken after fasting for at least 8 hours.   BUN  15 8 - 23 mg/dL   Creatinine, Ser 7.98 (H) 0.61 - 1.24 mg/dL   Calcium 8.3 (L) 8.9 - 10.3 mg/dL   Total Protein 6.9 6.5 - 8.1 g/dL   Albumin 2.5 (L) 3.5 - 5.0 g/dL   AST 921 (H) 15 - 41 U/L   ALT 78 (H) 0 - 44 U/L   Alkaline Phosphatase 796 (H) 38 - 126 U/L   Total Bilirubin 1.6 (H) 0.3 - 1.2 mg/dL   GFR, Estimated 57 (L) >60 mL/min    Comment: (NOTE) Calculated using the CKD-EPI Creatinine Equation (2021)    Anion gap 16 (H) 5 - 15    Comment: Performed at [facility], [address] [place], [place] [address]  CBC     Status: Abnormal   Collection Time: 09/14/20 12:40 PM  Result Value Ref Range   WBC 13.4  (H) 4.0 - 10.5 K/uL   RBC 4.66 4.22 - 5.81 MIL/uL   Hemoglobin 13.1 13.0 - 17.0 g/dL   HCT 40.8 39 - 52 %   MCV 91.2 80.0 - 100.0 fL   MCH 28.1 26.0 - 34.0 pg   MCHC 30.8 30.0 - 36.0 g/dL   RDW 14.4 (H) 81.8 - 56.3 %   Platelets 486 (H) 150 - 400 K/uL   nRBC 0.0 0.0 - 0.2 %    Comment: Performed at [facility], [address] [place], [place] [address]  C Difficile Quick Screen w PCR reflex     Status: None   Collection Time: 09/14/20  2:20 PM   Specimen: STOOL  Result Value Ref Range   C Diff antigen NEGATIVE NEGATIVE   C Diff toxin NEGATIVE NEGATIVE   C Diff interpretation No C. difficile detected.     Comment: Performed at [facility], [address] [place], [place] [address]  Respiratory Panel by RT PCR (Flu A&B, Covid) - Nasopharyngeal Swab     Status: None   Collection Time: 09/14/20  4:04 PM   Specimen: Nasopharyngeal Swab  Result Value Ref Range   SARS Coronavirus 2 by RT PCR NEGATIVE NEGATIVE    Comment: (NOTE) SARS-CoV-2 target nucleic acids are NOT DETECTED.  The SARS-CoV-2 RNA is generally detectable in upper respiratoy specimens during the acute phase of infection. The lowest concentration of SARS-CoV-2 viral copies this assay can detect is 131 copies/mL. A negative result does not preclude SARS-Cov-2 infection and should not be used as the sole basis for treatment or other patient management decisions. A negative result may occur with  improper specimen collection/handling, submission of specimen other than nasopharyngeal swab, presence of viral mutation(s) within the areas targeted by this assay, and inadequate number of viral copies (<131 copies/mL). A negative result must be combined with clinical observations, patient history, and epidemiological information. The expected result is Negative.  Fact Sheet for Patients:  https://www.moore.com/  Fact Sheet for Healthcare Providers:   https://www.young.biz/  This test is no t yet approved or cleared by the [place] FDA and  has been authorized for detection and/or diagnosis of SARS-CoV-2 by FDA under an Emergency Use Authorization (EUA). This EUA will remain  in effect (meaning this test can be used) for the duration of the COVID-19 declaration under Section 564(b)(1) of the Act, 21 U.S.C. section 360bbb-3(b)(1), unless the authorization is terminated or revoked sooner.     Influenza A by PCR NEGATIVE NEGATIVE   Influenza B by PCR NEGATIVE NEGATIVE    Comment: (  NOTE) The Xpert Xpress SARS-CoV-2/FLU/RSV assay is intended as an aid in  the diagnosis of influenza from Nasopharyngeal swab specimens and  should not be used as a sole basis for treatment. Nasal washings and  aspirates are unacceptable for Xpert Xpress SARS-CoV-2/FLU/RSV  testing.  Fact Sheet for Patients: https://www.moore.com/https://www.fda.gov/media/142436/download  Fact Sheet for Healthcare Providers: https://www.young.biz/https://www.fda.gov/media/142435/download  This test is not yet approved or cleared by the [place] FDA and  has been authorized for detection and/or diagnosis of SARS-CoV-2 by  FDA under an Emergency Use Authorization (EUA). This EUA will remain  in effect (meaning this test can be used) for the duration of the  Covid-19 declaration under Section 564(b)(1) of the Act, 21  U.S.C. section 360bbb-3(b)(1), unless the authorization is  terminated or revoked. Performed at [facility] [place], [address] [place], [place] [address]   Urinalysis, Routine w reflex microscopic Urine, Clean Catch     Status: Abnormal   Collection Time: 09/14/20  5:20 PM  Result Value Ref Range   Color, Urine BROWN (A) YELLOW    Comment: BIOCHEMICALS MAY BE AFFECTED BY COLOR   APPearance CLOUDY (A) CLEAR   Specific Gravity, Urine <1.005 (L) 1.005 - 1.030   pH 5.5 5.0 - 8.0   Glucose, UA NEGATIVE NEGATIVE mg/dL   Hgb urine dipstick LARGE (A) NEGATIVE    Bilirubin Urine MODERATE (A) NEGATIVE   Ketones, ur 15 (A) NEGATIVE mg/dL   Protein, ur 30 (A) NEGATIVE mg/dL   Nitrite NEGATIVE NEGATIVE   Leukocytes,Ua NEGATIVE NEGATIVE    Comment: Performed at [facility] [place], [address] [place], [place] [address]  Urinalysis, Microscopic (reflex)     Status: Abnormal   Collection Time: 09/14/20  5:20 PM  Result Value Ref Range   RBC / HPF >50 0 - 5 RBC/hpf   WBC, UA 0-5 0 - 5 WBC/hpf   Bacteria, UA MANY (A) NONE SEEN   Squamous Epithelial / LPF 0-5 0 - 5   Hyaline Casts, UA PRESENT    Granular Casts, UA PRESENT    WBC Casts, UA PRESENT     Comment: Performed at [facility] [place], [address] [place], [place] [address]  Lactic acid, plasma     Status: None   Collection Time: 09/14/20  5:25 PM  Result Value Ref Range   Lactic Acid, Venous 1.3 0.5 - 1.9 mmol/L    Comment: Performed at [facility] [place], [address] [place], [place] [address]    CT Abdomen Pelvis W Contrast  Result Date: 09/14/2020 CLINICAL DATA:  Diarrhea after surgery, fatigue, weakness, history atrial fibrillation, coronary artery disease, hypertension, post CABG and Maze procedure on 07/29/2020 EXAM: CT ABDOMEN AND PELVIS WITH CONTRAST TECHNIQUE: Multidetector CT imaging of the abdomen and pelvis was performed using the standard protocol following bolus administration of intravenous contrast. Sagittal and coronal MPR images reconstructed from axial data set. CONTRAST:  100mL OMNIPAQUE IOHEXOL 300 MG/ML SOLN IV. No oral contrast. COMPARISON:  07/16/2017 FINDINGS: Lower chest: Small to moderate LEFT pleural effusion with partial atelectasis of LEFT lower lobe. Minimal RIGHT pleural effusion and basilar atelectasis dependently. Calcified granuloma RIGHT lower lobe. Hepatobiliary: Distended gallbladder with wall thickening and pericholecystic infiltrative changes concerning for acute cholecystitis. No definite calcified gallstones. Dilated CBD up to 19 mm  diameter to ampulla. No definite calcification at duodenal junction though questionable wall thickening at the ampulla is present. Intrahepatic biliary dilatation present. No focal hepatic mass  lesion. Pancreas: No focal abnormalities Spleen: Normal appearance Adrenals/Urinary Tract: 5 mm calculus at central LEFT kidney. Cortical scarring LEFT kidney. Adrenal glands, kidneys, and ureters otherwise normal appearance. Bladder contains small amount of urine and extends into a RIGHT inguinal hernia. Stomach/Bowel: Minimal colonic diverticulosis without evidence of diverticulitis. Normal appendix. Stomach decompressed containing radiodense material question medication. Remaining bowel loops unremarkable. Vascular/Lymphatic: Atherosclerotic calcifications aorta and iliac arteries without aneurysm. No adenopathy Reproductive: Unremarkable prostate gland and seminal vesicles. Other: RIGHT inguinal hernia containing fat and portion of the urinary bladder. No bowel herniation. No additional hernia is identified. No free air or free fluid. Musculoskeletal: Osseous demineralization. IMPRESSION: Distended gallbladder with wall thickening and pericholecystic infiltrative changes concerning for acute cholecystitis. Dilated CBD up to 19 mm diameter with intrahepatic biliary dilatation, recommend correlation with LFTs; cause of biliary dilatation is uncertain, without calcification evident, could be due to noncalcified stone, mass, or stricture. This could be assessed by MRCP imaging for further evaluation. RIGHT inguinal hernia containing fat and a portion of the urinary bladder. 5 minutes mm nonobstructing LEFT renal calculus. Small to moderate LEFT and minimal RIGHT pleural effusions with bibasilar atelectasis greater on LEFT. Minimal colonic diverticulosis without evidence of diverticulitis. Aortic Atherosclerosis (ICD10-I70.0). Electronically Signed   By: Ulyses Southward M.D.   On: 09/14/2020 15:17   DG Chest Port 1  View  Result Date: 09/14/2020 CLINICAL DATA:  Acute cholecystitis, diarrhea for 2 months, abdominal distension EXAM: PORTABLE CHEST 1 VIEW COMPARISON:  09/01/2020 FINDINGS: Single frontal view of the chest demonstrates a stable cardiac silhouette. Chronic left basilar consolidation and effusion, stable. Right chest is clear. No pneumothorax. Chronic postsurgical changes from median sternotomy. IMPRESSION: 1. Persistent left basilar consolidation and effusion. Electronically Signed   By: Sharlet Salina M.D.   On: 09/14/2020 16:41    Review of Systems  Constitutional: Negative.   HENT: Negative.   Eyes: Negative.   Respiratory: Negative.   Cardiovascular: Negative.   Gastrointestinal: Positive for diarrhea. Negative for abdominal pain and vomiting.  Endocrine: Negative.   Genitourinary: Negative.   Musculoskeletal: Negative.   Skin: Negative.   Allergic/Immunologic: Negative.   Neurological: Negative.   Hematological: Negative.   Psychiatric/Behavioral: Negative.    Blood pressure (!) 114/59, pulse 75, temperature (!) 97.5 F (36.4 C), temperature source Oral, resp. rate 20, height 6\' 2"  (1.88 m), weight 104.3 kg, SpO2 97 %. Physical Exam Vitals reviewed.  Constitutional:      General: He is not in acute distress.    Appearance: Normal appearance. He is normal weight.  HENT:     Head: Normocephalic and atraumatic.     Right Ear: External ear normal.     Left Ear: External ear normal.     Nose: Nose normal.     Mouth/Throat:     Mouth: Mucous membranes are moist.     Pharynx: Oropharynx is clear.  Eyes:     General: No scleral icterus.    Extraocular Movements: Extraocular movements intact.     Conjunctiva/sclera: Conjunctivae normal.     Pupils: Pupils are equal, round, and reactive to light.  Cardiovascular:     Rate and Rhythm: Normal rate. Rhythm irregular.     Pulses: Normal pulses.     Heart sounds: Normal heart sounds.     Comments: No pitting edema lower  extr Pulmonary:     Effort: Pulmonary effort is normal. No respiratory distress.     Breath sounds: Normal breath sounds.  Abdominal:  General: Abdomen is flat. There is no distension.     Palpations: Abdomen is soft.     Tenderness: There is no abdominal tenderness.  Musculoskeletal:        General: No tenderness or deformity. Normal range of motion.     Cervical back: Normal range of motion and neck supple. No tenderness.  Skin:    General: Skin is warm and dry.     Findings: No rash.  Neurological:     General: No focal deficit present.     Mental Status: He is alert and oriented to person, place, and time.  Psychiatric:        Mood and Affect: Mood normal.        Behavior: Behavior normal.     Assessment/Plan: The patient has CT findings consistent with possible cholecystitis but no abdominal pain whatsoever or nausea.  He does have significant diarrhea after his recent coronary bypass surgery.  At this point I would recommend getting a HIDA scan to rule out cholecystitis.  He should also be evaluated by the gastroenterologist for the diarrhea.  We will follow him closely with you.  If he does need surgery he will need cardiac clearance and would need to be off his blood thinners for at least a couple of days.  Chevis Pretty III 09/14/2020, 11:47 PM

## 2020-09-14 NOTE — Progress Notes (Signed)
Paged GI and General Surgery for consults. Both returned pages.

## 2020-09-14 NOTE — H&P (Signed)
History and Physical    PLEASE NOTE THAT DRAGON DICTATION SOFTWARE WAS USED IN THE CONSTRUCTION OF THIS NOTE.   [name] Rice QTM:[id number] DOB: [birth year] DOA: 09/14/2020  PCP: Aletha Halim., PA-C Patient coming from: home   I have personally briefly reviewed patient's old medical records in [place]  Chief Complaint: Diarrhea  HPI: [name] Rice is a 69 y.o. male with medical history significant for coronary artery disease status post four-vessel CABG in September 2021, known left bundle branch block, hypertension, paroxysmal atrial fibrillation chronically anticoagulated on Xarelto, who is admitted to [facility] on 09/14/2020 as transfer from [place] emergency department with sepsis due to suspected acute cholecystitis presenting from home to the latter facility complaining of diarrhea.  The patient reports approximately 1 month of loose stools of increasing frequency.  Over the last week he notes approximately 2 episodes of loose stool per day in the absence of any associated melena or hematochezia.  He also notes 3 to 4 days of intermittent right upper quadrant abdominal discomfort, which she reports as sharp, nonradiating discomfort that worsens shortly after attempting to eat.  Denies any recent travel or trauma.  Denies any associated nausea/vomiting.  Denies any associated subjective fever, chills, rigors, or generalized myalgias.  Patient does not believe he has been on any recent antibiotics, and denies use of proton pump inhibitor as an outpatient.  Denies any recent headache, neck stiffness, rhinitis, rhinorrhea, sore throat, shortness of breath, wheezing, cough, or rash.  Denies any associated dysuria, gross hematuria, or change in urinary urgency/frequency.  Denies any associated chest pain, diaphoresis, palpitations, dizziness, presyncope, or syncope.  Of note, the patient presented to [facility] emergency department on 09/04/2020 for  further evaluation of loose stool, at which time he was noted to have mildly elevated liver enzymes.  Associated abdominal ultrasound reportedly showed gallbladder sludge, but no evidence of acute cholecystitis as well as no evidence of common bile duct dilation and no overt choledocholithiasis.  It appears that the patient was instructed to follow-up with gastroenterology, although the patient acknowledges some confusion on his part regarding the follow-up instructions, and conveys that he was resultantly noncompliant with this recommendation.  Does not follow with gastroenterology on a recurring basis.   Of note, the patient underwent four-vessel CABG on 07/29/2020, with associated hospitalization from 07/23/2020 to 08/05/2020.  He also has a history of paroxysmal atrial fibrillation for which she underwent Maze procedure on 07/29/2020, and is chronically anticoagulated on Xarelto.  In the setting of persistence and slight progression of recent diarrhea as well as the presence of right upper quadrant abdominal discomfort, the patient presented to [place] emergency department earlier today for further evaluation.    [facility] ED Course:   Vital signs in the ED were notable for the following: Check T-max 97.8; initial blood pressure 82/41, which improved to 112/66 following administration of IV fluids, as further described below; heart rate 66-88; respiratory rate 15-25; oxygen saturation 97 to 100% on room air.  Labs were notable for the following: CMP notable for the following: Sodium 136, potassium 3.1, bicarbonate 23, anion gap 16, creatinine 1.36 relative day 1.35 on 09/04/2020, and relative to apparent baseline creatinine of 0.7-1.1, with value of 0.94 on 08/12/2020; CMP also notable for the following, with comparison to most recent previous liver enzymes on 09/04/2020: Albumin 2.5, alkaline phosphatase 796 relative to 398 on 09/04/2020, AST 105 compared to 95, ALT 78 compared to  47, total  bilirubin 1.6 compared to 3.7.  Lipase 36.  CBC notable for the following: White blood cell count 13,400 compared to most recent prior value of 10,000 on 09/04/2020.  Lactic acid 1.3.  Urinalysis notable for 0 white blood cells, nitrate negative, leukocyte Estrace negative, positive muddy brown granular casts as well as positive hyaline casts, 15 ketones, 30 protein.  COVID-19 PCR/influenza PCR performed at [facility] ED were found to be negative.   Chest x-ray, per comparison to most recent prior on 09/01/2020, showed a persistent left basilar consolidation and effusion, without interval change, and no evidence of interval infiltrate, edema, or pneumothorax.  CT abdomen/pelvis with contrast showed evidence of a distended gallbladder with gallbladder wall thickening and pericholecystic fluid changes concerning for acute cholecystitis.  Additionally, CT abdomen/pelvis showed the common bile duct dilated up to 19 mm with intrahepatic biliary dilation in the absence of overt choledocholithiasis.  CT abdomen/pelvis showed minimal colonic diverticulosis without evidence of diverticulitis, and showed no evidence of bowel obstruction, abscess, or perforation.  The emergency department physician at [facility] discussed the patient's case and imaging with the on-call general surgeon, Dr. Marlou Starks, who conveyed that they would consult on this patient and recommended initial gastroenterology consultation for evaluation of potential choledocholithiasis.  Subsequently, the med center [facility] emergency department physician discussed the patient's case with the on-call gastroenterologist, Dr. Lyndel Safe, who agreed to consult on the patient, with plan to evaluate for MRCP versus ERCP upon arrival at [facility].  Subsequently, the patient was accepted by Dr. Alfredia Ferguson for admission to the hospitalist service at [facility] for further evaluation and management of suspected sepsis due to acute  cholecystitis.  While in the [place] ED, the following were administered: Potassium chloride 40 mEq p.o. x1, potassium chloride 20 mEq IV over 2 hours x 1, normal saline x2 L bolus followed by initiation of continuous normal saline at 75 cc/h, Zosyn 3.375 g IV x1.    Review of Systems: As per HPI otherwise 10 point review of systems negative.   Past Medical History:  Diagnosis Date  . Atrial fibrillation ([place])   . Coronary artery disease   . Dysrhythmia   . Hypertension   . LBBB (left bundle branch block)     Past Surgical History:  Procedure Laterality Date  . CARDIOVERSION N/A 09/28/2016   Procedure: CARDIOVERSION;  Surgeon: Adrian Prows, MD;  Location: [place];  Service: Cardiovascular;  Laterality: N/A;  . CLIPPING OF ATRIAL APPENDAGE Left 07/29/2020   Procedure: CLIPPING OF ATRIAL APPENDAGE;  Surgeon: Wonda Olds, MD;  Location: [place];  Service: Open Heart Surgery;  Laterality: Left;  . CORONARY ARTERY BYPASS GRAFT N/A 07/29/2020   Procedure: CORONARY ARTERY BYPASS GRAFTING (CABG) TIMES FOUR USING BILATERAL INTERNAL MAMMARIES AND LEFT RADIAL ARTERY;  Surgeon: Wonda Olds, MD;  Location: [place];  Service: Open Heart Surgery;  Laterality: N/A;  . LEFT HEART CATH AND CORONARY ANGIOGRAPHY N/A 07/25/2020   Procedure: LEFT HEART CATH AND CORONARY ANGIOGRAPHY;  Surgeon: Adrian Prows, MD;  Location: [place] [facility];  Service: Cardiovascular;  Laterality: N/A;  . MAZE N/A 07/29/2020   Procedure: MAZE;  Surgeon: Wonda Olds, MD;  Location: [place];  Service: Open Heart Surgery;  Laterality: N/A;  . NO PAST SURGERIES    . RADIAL ARTERY HARVEST Left 07/29/2020   Procedure: RADIAL ARTERY HARVEST;  Surgeon: Wonda Olds, MD;  Location: [place];  Service: Open Heart Surgery;  Laterality: Left;  . TEE WITHOUT CARDIOVERSION N/A 07/29/2020   Procedure: TRANSESOPHAGEAL ECHOCARDIOGRAM (TEE);  Surgeon: Wonda Olds, MD;  Location: [place];  Service: Open Heart  Surgery;  Laterality: N/A;    Social History:  reports that he has never smoked. He has never used smokeless tobacco. He reports that he does not drink alcohol and does not use drugs.   No Known Allergies  Family History  Problem Relation Age of Onset  . Hypertension Other     Prior to Admission medications   Medication Sig Start Date End Date Taking? Authorizing Provider  acetaminophen (TYLENOL) 500 MG tablet Take 1,000 mg by mouth every 6 (six) hours as needed for headache (pain).   Yes [provider]  amLODipine (NORVASC) 10 MG tablet Take 5 mg by mouth at bedtime.    Yes [provider]  Ensure (ENSURE) Take 1 Can by mouth See admin instructions. Drink 1 can of VANILLA ENSURE by mouth once a day   Yes [provider]  loperamide (IMODIUM A-D) 2 MG tablet Take 2 mg by mouth 4 (four) times daily as needed for diarrhea or loose stools.   Yes [provider]  olmesartan (BENICAR) 40 MG tablet Take 40 mg by mouth in the morning.    Yes [provider]  rosuvastatin (CRESTOR) 40 MG tablet Take 1 tablet (40 mg total) by mouth daily. Patient taking differently: Take 40 mg by mouth at bedtime.  08/05/20  Yes Zimmerman, Donielle M, PA-C  XARELTO 20 MG TABS tablet TAKE 1 TABLET BY MOUTH IN  THE EVENING AFTER DINNER Patient taking differently: Take 20 mg by mouth daily after supper.  04/01/20  Yes Tolia, Sunit, DO  metoprolol tartrate (LOPRESSOR) 25 MG tablet Take 0.5 tablets (12.5 mg total) by mouth daily. Patient taking differently: Take 12.5 mg by mouth in the morning.  08/26/20 11/24/20  Tolia, Sunit, DO  potassium chloride SA (KLOR-CON) 20 MEQ tablet Take 1 tablet (20 mEq total) by mouth 2 (two) times daily. 09/14/20   Fredia Sorrow, MD     Objective    Physical Exam: Vitals:   09/14/20 1700 09/14/20 1715 09/14/20 1730 09/14/20 1837  BP: (!) 81/45 (!) 112/98 120/90 112/66  Pulse:    77  Resp: 17 (!) 22 (!) 23 18  Temp:    97.8 F (36.6  C)  TempSrc:    Oral  SpO2:    100%  Weight:      Height:        General: appears to be stated age; alert, oriented Skin: warm, dry, no rash Head:  AT/[place] Mouth:  Oral mucosa membranes appear dry, normal dentition Neck: supple; trachea midline Heart:  RRR; did not appreciate any M/R/G Lungs: CTAB, did not appreciate any wheezes, rales, or rhonchi Abdomen: + BS; soft, ND; mild tenderness with palpation to the right upper quadrant in the absence of associated guarding, rigidity, or rebound tenderness. Vascular: 2+ pedal pulses b/l; 2+ radial pulses b/l Extremities: no peripheral edema, no muscle wasting Neuro: strength and sensation intact in upper and lower extremities b/l   Labs on Admission: I have personally reviewed following labs and imaging studies  CBC: Recent Labs  Lab 09/14/20 1240  WBC 13.4*  HGB 13.1  HCT 42.5  MCV 91.2  PLT 852*   Basic Metabolic Panel: Recent Labs  Lab 09/14/20 1240  NA 136  K 3.1*  CL 97*  CO2 23  GLUCOSE 103*  BUN 15  CREATININE 1.36*  CALCIUM 8.3*   GFR: Estimated Creatinine Clearance: 67.8 mL/min (A) (by C-G formula based on SCr of 1.36 mg/dL (H)). Liver Function Tests: Recent Labs  Lab 09/14/20 1240  AST 105*  ALT 78*  ALKPHOS 796*  BILITOT 1.6*  PROT 6.9  ALBUMIN 2.5*   Recent Labs  Lab 09/14/20 1240  LIPASE 36   No results for input(s): AMMONIA in the last 168 hours. Coagulation Profile: No results for input(s): INR, PROTIME in the last 168 hours. Cardiac Enzymes: No results for input(s): CKTOTAL, CKMB, CKMBINDEX, TROPONINI in the last 168 hours. BNP (last 3 results) No results for input(s): PROBNP in the last 8760 hours. HbA1C: No results for input(s): HGBA1C in the last 72 hours. CBG: No results for input(s): GLUCAP in the last 168 hours. Lipid Profile: No results for input(s): CHOL, HDL, LDLCALC, TRIG, CHOLHDL, LDLDIRECT in the last 72 hours. Thyroid Function Tests: No results for input(s): TSH,  T4TOTAL, FREET4, T3FREE, THYROIDAB in the last 72 hours. Anemia Panel: No results for input(s): VITAMINB12, FOLATE, FERRITIN, TIBC, IRON, RETICCTPCT in the last 72 hours. Urine analysis:    Component Value Date/Time   COLORURINE BROWN (A) 09/14/2020 1720   APPEARANCEUR CLOUDY (A) 09/14/2020 1720   LABSPEC <1.005 (L) 09/14/2020 1720   PHURINE 5.5 09/14/2020 1720   GLUCOSEU NEGATIVE 09/14/2020 1720   HGBUR LARGE (A) 09/14/2020 1720   BILIRUBINUR MODERATE (A) 09/14/2020 1720   KETONESUR 15 (A) 09/14/2020 1720   PROTEINUR 30 (A) 09/14/2020 1720   NITRITE NEGATIVE 09/14/2020 1720   LEUKOCYTESUR NEGATIVE 09/14/2020 1720    Radiological Exams on Admission: CT Abdomen Pelvis W Contrast  Result Date: 09/14/2020 CLINICAL DATA:  Diarrhea after surgery, fatigue, weakness, history atrial fibrillation, coronary artery disease, hypertension, post CABG and Maze procedure on 07/29/2020 EXAM: CT ABDOMEN AND PELVIS WITH CONTRAST TECHNIQUE: Multidetector CT imaging of the abdomen and pelvis was performed using the standard protocol following bolus administration of intravenous contrast. Sagittal and coronal MPR images reconstructed from axial data set. CONTRAST:  148m OMNIPAQUE IOHEXOL 300 MG/ML SOLN IV. No oral contrast. COMPARISON:  07/16/2017 FINDINGS: Lower chest: Small to moderate LEFT pleural effusion with partial atelectasis of LEFT lower lobe. Minimal RIGHT pleural effusion and basilar atelectasis dependently. Calcified granuloma RIGHT lower lobe. Hepatobiliary: Distended gallbladder with wall thickening and pericholecystic infiltrative changes concerning for acute cholecystitis. No definite calcified gallstones. Dilated CBD up to 19 mm diameter to ampulla. No definite calcification at duodenal junction though questionable wall thickening at the ampulla is present. Intrahepatic biliary dilatation present. No focal hepatic mass lesion. Pancreas: No focal abnormalities Spleen: Normal appearance  Adrenals/Urinary Tract: 5 mm calculus at central LEFT kidney. Cortical scarring LEFT kidney. Adrenal glands, kidneys, and ureters otherwise normal appearance. Bladder contains small amount of urine and extends into a RIGHT inguinal hernia. Stomach/Bowel: Minimal colonic diverticulosis without evidence of diverticulitis. Normal appendix. Stomach decompressed containing radiodense material question medication. Remaining bowel loops unremarkable. Vascular/Lymphatic: Atherosclerotic calcifications aorta and iliac arteries without aneurysm. No adenopathy Reproductive: Unremarkable prostate gland and seminal vesicles. Other: RIGHT inguinal hernia containing fat and portion of the urinary bladder. No bowel herniation. No additional hernia is identified. No free air or free fluid. Musculoskeletal: Osseous demineralization. IMPRESSION: Distended gallbladder with wall thickening and pericholecystic infiltrative changes concerning for acute cholecystitis. Dilated CBD up to 19 mm diameter with intrahepatic biliary dilatation, recommend correlation with LFTs; cause of biliary dilatation is uncertain, without calcification evident, could be due to noncalcified stone, mass,  or stricture. This could be assessed by MRCP imaging for further evaluation. RIGHT inguinal hernia containing fat and a portion of the urinary bladder. 5 minutes mm nonobstructing LEFT renal calculus. Small to moderate LEFT and minimal RIGHT pleural effusions with bibasilar atelectasis greater on LEFT. Minimal colonic diverticulosis without evidence of diverticulitis. Aortic Atherosclerosis (ICD10-I70.0). Electronically Signed   By: Lavonia Dana M.D.   On: 09/14/2020 15:17   DG Chest Port 1 View  Result Date: 09/14/2020 CLINICAL DATA:  Acute cholecystitis, diarrhea for 2 months, abdominal distension EXAM: PORTABLE CHEST 1 VIEW COMPARISON:  09/01/2020 FINDINGS: Single frontal view of the chest demonstrates a stable cardiac silhouette. Chronic left basilar  consolidation and effusion, stable. Right chest is clear. No pneumothorax. Chronic postsurgical changes from median sternotomy. IMPRESSION: 1. Persistent left basilar consolidation and effusion. Electronically Signed   By: Randa Ngo M.D.   On: 09/14/2020 16:41     Assessment/Plan   [name] Rice is a 69 y.o. male with medical history significant for coronary artery disease status post four-vessel CABG in September 2021, known left bundle branch block, hypertension, paroxysmal atrial fibrillation chronically anticoagulated on Xarelto, who is admitted to [facility] on 09/14/2020 as transfer from [place] emergency department with sepsis due to suspected acute cholecystitis presenting from home to the latter facility complaining of diarrhea.   Principal Problem:   Acute cholecystitis Active Problems:   Atrial fibrillation (HCC)   Pure hypercholesterolemia   Hypokalemia   CAD, multiple vessel   AKI (acute kidney injury) ([place])   Sepsis ([place])   Abdominal pain    #) Sepsis due to acute cholecystitis: Diagnosis on the basis of presenting RUQ abdominal pain with finding of acute transaminitis with potential cholestatic pattern in the context of CT abdomen/pelvis revealing evidence of acute cholecystitis as well as dilated common bile duct in the absence of overt evidence of choledocholithiasis.  No clinical evidence to suggest ascending cholangitis at this time the absence of fever and jaundice.  SIRS criteria met via presenting leukocytosis and tachypnea.  Patient sepsis does not appear to meet criteria to be considered severe in nature in the absence of associated evidence of endorgan damage, including a presenting nonelevated lactic acid level of 1.3.  Patient's case and imaging were discussed with the on-call general surgeon and the on-call gastroenterologist, as further described above, with plan for GI to evaluate the patient upon arrival at the [facility] for  determination of necessity of MRCP versus ERCP. gastroenterology notified of patient's arrival at [facility], as above.  No other overt infectious source identified at this time, including COVID-19/influenza PCR found to be negative.  Additionally, urinalysis was not suggestive of an acute urinary tract infection, while chest x-ray showed no interval changes relative to most recent prior plain films, as further detailed above.  Blood cultures x2 were collected prior to initiation of broad-spectrum antibiotics in the form of Zosyn.  In the setting of presenting sepsis with suspected intra-abdominal source, will continue IV Zosyn for now, which will continue to provide anaerobic coverage.   Plan: Monitor for results of blood cultures x2 collected prior to initiation of antibiotics.  Continue Zosyn, as above.  Gentle continuous IV fluids.  N.p.o. repeat CBC with differential in the morning.  Repeat CMP.  Check direct bilirubin.  Check INR. Follow-up on recommendations pending GI consult.     #) Hypokalemia: Presenting labs reflect serum potassium 3.1 in the context of recent increased GI losses, as further described  above.  The patient received a total of 60 mEq of potassium supplementation at [facility].   Plan: Add on serum magnesium level and plan to repeat magnesium level in the morning.  Repeat CMP in the morning.  Monitor on telemetry.  Continuous NS @ 125 cc/hr, as above.  Work-up and management of sepsis due to acute cholecystitis, as above.     #) Acute kidney injury: Presenting labs reflect serum creatinine 1.36 which is relative to his apparent baseline creatinine range of 0.7-1.1, including relative to 0.94 when checked on 08/12/2020.  Suspect that this was originally exclusively prerenal in nature due to intravascular depletion in the setting of sepsis due to acute cholecystitis as well as dehydration from recent increased GI losses, as above.  However, in the context of presenting urinalysis showing  evidence of muddy brown granular casts suggestive of ATN, it is suspected that an element of this prolonged prerenal strain ultimately resulted in acute tubular necrosis.  There may also be a pharmacologic exacerbating factor in the setting of home olmesartan.   Plan: Work-up and management of presenting sepsis due to suspected acute cholecystitis, as above, including continuous IV fluids in the form of normal saline at 125 cc/h, with consideration for transition to lactated Ringer's dependent upon subsequent trend in kidney function in response to these measures.  Add on random urine sodium as well as random urine creatinine.  Repeat CMP in the morning.  Hold home olmesartan.      #) Coronary artery disease: Status post four-vessel CABG on 07/29/2020.  Outpatient cardiac medications include Lopressor, olmesartan, rosuvastatin, and Xarelto.  The patient denies any recent chest pain, and presenting chest x-ray shows no evidence of interval change relative to CXR on 09/01/2020, as further described above. Will check EKG to establish hospitalization baseline.   Plan: In the context of current n.p.o. status and potential need to undergo surgery in the setting of suspected presenting acute cholecystitis, will hold Xarelto and rosuvastatin.  Additionally, in setting of presenting sepsis and acute kidney injury, will hold olmesartan for now.  Check EKG, as above.  Add on serum magnesium level.     #) Paroxysmal atrial fibrillation: Status post Maze procedure on 07/29/2020. In the setting of a CHA2DS2-VASc score of 3, there is an indication for the patient to be on chronic anticoagulation for thromboembolic prophylaxis.  Consistent with this, the patient is chronically anticoagulated on Xarelto, with most recent dose believed to have occurred on the morning of 09/14/2020.  Home AV nodal blocking regimen consists of Lopressor, as above.  EKG has been ordered, with result currently pending at this time, as above.   We will hold tomorrow morning's dose of Xarelto for now given the possibility of surgical intervention for presenting acute cholecystitis with suspicion for cholestatic pattern, as further described above.  Plan: Monitor strict I's and O's and daily weights.  Repeat CMP in the morning.  Add on serum magnesium level and repeat serum magnesium level in the morning.  We will hold home Xarelto for now, as above.  In the setting of presenting sepsis, will also hold home AV nodal blocking agent for now.  Monitor on telemetry.      #) Hypertension: Outpatient antihypertensive medications include olmesartan, amlodipine, and Lopressor.  In the context of presenting diarrhea, initial systolic blood pressure was noted to be in the 80s, which subsequently remained normotensive following interval IV fluid administration, as further described above.  However, in the setting of sepsis with initial hypotensive  findings, will hold home antihypertensive medications for now.  Plan: Hold home amlodipine, losartan, and Lopressor for now, as further described above.  Close monitoring of ensuing blood pressure via routine vital signs.      DVT prophylaxis: SCDs Code Status: Full code Family Communication: none Disposition Plan: Per Rounding Team Consults called: GI and general surgery, as further detailed above Admission status: Inpatient; med telemetry.  COVID-19 Vaccination status: Pfizer vaccine x2 in February 2021.    PLEASE NOTE THAT DRAGON DICTATION SOFTWARE WAS USED IN THE CONSTRUCTION OF THIS NOTE.   Rhetta Mura DO [facility] Pager [phone] From [place]   09/14/2020, 7:58 PM

## 2020-09-14 NOTE — ED Notes (Signed)
Dr. Criss Alvine sees him and orders are given for IVF and Lactate, which I do. Pt. remains in no distress.

## 2020-09-15 ENCOUNTER — Inpatient Hospital Stay (HOSPITAL_COMMUNITY): Payer: Medicare Other

## 2020-09-15 DIAGNOSIS — E876 Hypokalemia: Secondary | ICD-10-CM | POA: Diagnosis not present

## 2020-09-15 DIAGNOSIS — N179 Acute kidney failure, unspecified: Secondary | ICD-10-CM | POA: Diagnosis not present

## 2020-09-15 DIAGNOSIS — R197 Diarrhea, unspecified: Secondary | ICD-10-CM

## 2020-09-15 DIAGNOSIS — I4821 Permanent atrial fibrillation: Secondary | ICD-10-CM | POA: Diagnosis not present

## 2020-09-15 DIAGNOSIS — R935 Abnormal findings on diagnostic imaging of other abdominal regions, including retroperitoneum: Secondary | ICD-10-CM | POA: Diagnosis not present

## 2020-09-15 DIAGNOSIS — I2510 Atherosclerotic heart disease of native coronary artery without angina pectoris: Secondary | ICD-10-CM

## 2020-09-15 DIAGNOSIS — R748 Abnormal levels of other serum enzymes: Secondary | ICD-10-CM | POA: Diagnosis not present

## 2020-09-15 DIAGNOSIS — R109 Unspecified abdominal pain: Secondary | ICD-10-CM | POA: Diagnosis present

## 2020-09-15 DIAGNOSIS — R1011 Right upper quadrant pain: Secondary | ICD-10-CM | POA: Diagnosis not present

## 2020-09-15 DIAGNOSIS — E7800 Pure hypercholesterolemia, unspecified: Secondary | ICD-10-CM

## 2020-09-15 DIAGNOSIS — K810 Acute cholecystitis: Secondary | ICD-10-CM | POA: Diagnosis not present

## 2020-09-15 DIAGNOSIS — A419 Sepsis, unspecified organism: Secondary | ICD-10-CM | POA: Diagnosis present

## 2020-09-15 LAB — COMPREHENSIVE METABOLIC PANEL
ALT: 61 U/L — ABNORMAL HIGH (ref 0–44)
AST: 61 U/L — ABNORMAL HIGH (ref 15–41)
Albumin: 2.4 g/dL — ABNORMAL LOW (ref 3.5–5.0)
Alkaline Phosphatase: 690 U/L — ABNORMAL HIGH (ref 38–126)
Anion gap: 12 (ref 5–15)
BUN: 16 mg/dL (ref 8–23)
CO2: 22 mmol/L (ref 22–32)
Calcium: 7.8 mg/dL — ABNORMAL LOW (ref 8.9–10.3)
Chloride: 103 mmol/L (ref 98–111)
Creatinine, Ser: 1.02 mg/dL (ref 0.61–1.24)
GFR, Estimated: >60 mL/min (ref >60)
Glucose, Bld: 79 mg/dL (ref 70–99)
Potassium: 3.4 mmol/L — ABNORMAL LOW (ref 3.5–5.1)
Sodium: 137 mmol/L (ref 135–145)
Total Bilirubin: 1.6 mg/dL — ABNORMAL HIGH (ref 0.3–1.2)
Total Protein: 5.9 g/dL — ABNORMAL LOW (ref 6.5–8.1)

## 2020-09-15 LAB — CBC WITH DIFFERENTIAL/PLATELET
Abs Immature Granulocytes: 0.21 10*3/uL — ABNORMAL HIGH (ref 0.00–0.07)
Basophils Absolute: 0 10*3/uL (ref 0.0–0.1)
Basophils Relative: 0 %
Eosinophils Absolute: 0.2 10*3/uL (ref 0.0–0.5)
Eosinophils Relative: 2 %
HCT: 38.6 % — ABNORMAL LOW (ref 39.0–52.0)
Hemoglobin: 11.4 g/dL — ABNORMAL LOW (ref 13.0–17.0)
Immature Granulocytes: 2 %
Lymphocytes Relative: 13 %
Lymphs Abs: 1.3 10*3/uL (ref 0.7–4.0)
MCH: 28.3 pg (ref 26.0–34.0)
MCHC: 29.5 g/dL — ABNORMAL LOW (ref 30.0–36.0)
MCV: 95.8 fL (ref 80.0–100.0)
Monocytes Absolute: 0.7 10*3/uL (ref 0.1–1.0)
Monocytes Relative: 7 %
Neutro Abs: 7.9 10*3/uL — ABNORMAL HIGH (ref 1.7–7.7)
Neutrophils Relative %: 76 %
Platelets: 376 10*3/uL (ref 150–400)
RBC: 4.03 MIL/uL — ABNORMAL LOW (ref 4.22–5.81)
RDW: 16.9 % — ABNORMAL HIGH (ref 11.5–15.5)
WBC: 10.4 10*3/uL (ref 4.0–10.5)
nRBC: 0 % (ref 0.0–0.2)

## 2020-09-15 LAB — PROTIME-INR
INR: 1.7 — ABNORMAL HIGH (ref 0.8–1.2)
Prothrombin Time: 19.5 seconds — ABNORMAL HIGH (ref 11.4–15.2)

## 2020-09-15 LAB — BILIRUBIN, DIRECT: Bilirubin, Direct: 0.4 mg/dL — ABNORMAL HIGH (ref 0.0–0.2)

## 2020-09-15 LAB — MAGNESIUM
Magnesium: 1.9 mg/dL (ref 1.7–2.4)
Magnesium: 1.9 mg/dL (ref 1.7–2.4)

## 2020-09-15 MED ORDER — KCL IN DEXTROSE-NACL 20-5-0.45 MEQ/L-%-% IV SOLN
INTRAVENOUS | Status: DC
  Filled 2020-09-15 (×3): qty 1000

## 2020-09-15 MED ORDER — GADOBUTROL 1 MMOL/ML IV SOLN
10.0000 mL | Freq: Once | INTRAVENOUS | Status: AC | PRN
  Administered 2020-09-15: 10 mL via INTRAVENOUS

## 2020-09-15 NOTE — Plan of Care (Signed)
  Problem: Clinical Measurements: Goal: Will remain free from infection Outcome: Progressing Goal: Diagnostic test results will improve Outcome: Progressing   Problem: Activity: Goal: Risk for activity intolerance will decrease Outcome: Progressing   Problem: Nutrition: Goal: Adequate nutrition will be maintained Outcome: Progressing   Problem: Elimination: Goal: Will not experience complications related to bowel motility Outcome: Progressing Goal: Will not experience complications related to urinary retention Outcome: Progressing   Problem: Safety: Goal: Ability to remain free from injury will improve Outcome: Progressing   Problem: Skin Integrity: Goal: Risk for impaired skin integrity will decrease Outcome: Progressing   Problem: Clinical Measurements: Goal: Ability to maintain clinical measurements within normal limits will improve Outcome: Progressing Goal: Will remain free from infection Outcome: Progressing Goal: Diagnostic test results will improve Outcome: Progressing Goal: Respiratory complications will improve Outcome: Progressing   Problem: Activity: Goal: Risk for activity intolerance will decrease Outcome: Progressing

## 2020-09-15 NOTE — H&P (View-Only) (Signed)
Consultation  Referring Provider: Dr. Tyson Babinski     Primary Care Physician:  [name], PA-C Primary Gastroenterologist: Gentry Fitz       Reason for Consultation: Elevated LFTs             HPI:   [name] [name] is a 69 y.o. male with a past medical history significant for CAD status post four-vessel CABG in September 2021, known left bundle branch block, paroxysmal A. fib on Xarelto, who was admitted to the hospital on 09/14/2020 as a transfer from med [name] with sepsis due to suspected acute cholecystitis after presenting there with a complaint of diarrhea.    Today, the patient reports approximately 1 month of loose stools of increasing frequency, over the past week continuous episodes of loose stool per day regardless of Imodium.  Explains that at first he just thought this was after his stent procedure for his heart as they discussed loose stools afterwards which could be a consequence.  Tells me he was tested for C. difficile and this was negative.  Explains that he presented to the ER on a couple of occasions because he would become "dehydrated".  At time of my exam this morning he denies any right upper quadrant pain or nausea, but does describe 1 episode of vomiting 2 days ago.    Denies fever, chills, weight loss or blood in his stools.  ED course: Alkaline phosphatase 796 (398 on 10/28), AST 105 (95 on 10/28), ALT 78 (47 on 10/28), total bilirubin 1.6 (3.7 on 10/28), lipase 36, WBC 13,400, Covid negative, CT abdomen pelvis with contrast showed evidence of a distended gallbladder with gallbladder wall thickening and pericholecystic fluid changes concerning for acute cholecystitis, also common bile duct dilated up to 19 mm with intrahepatic biliary dilation and absence of overt choledocholithiasis  Past Medical History:  Diagnosis Date  . Atrial fibrillation (HCC)   . Coronary artery disease   . Dysrhythmia   . Hypertension   . LBBB (left bundle branch block)      Past Surgical History:  Procedure Laterality Date  . CARDIOVERSION N/A 09/28/2016   Procedure: CARDIOVERSION;  Surgeon: Yates Decamp, MD;  Location: [facility] ENDOSCOPY;  Service: Cardiovascular;  Laterality: N/A;  . CLIPPING OF ATRIAL APPENDAGE Left 07/29/2020   Procedure: CLIPPING OF ATRIAL APPENDAGE;  Surgeon: Linden Dolin, MD;  Location: [facility] OR;  Service: Open Heart Surgery;  Laterality: Left;  . CORONARY ARTERY BYPASS GRAFT N/A 07/29/2020   Procedure: CORONARY ARTERY BYPASS GRAFTING (CABG) TIMES FOUR USING BILATERAL INTERNAL MAMMARIES AND LEFT RADIAL ARTERY;  Surgeon: Linden Dolin, MD;  Location: [facility] OR;  Service: Open Heart Surgery;  Laterality: N/A;  . LEFT HEART CATH AND CORONARY ANGIOGRAPHY N/A 07/25/2020   Procedure: LEFT HEART CATH AND CORONARY ANGIOGRAPHY;  Surgeon: Yates Decamp, MD;  Location: [facility] INVASIVE CV LAB;  Service: Cardiovascular;  Laterality: N/A;  . MAZE N/A 07/29/2020   Procedure: MAZE;  Surgeon: Linden Dolin, MD;  Location: [facility] OR;  Service: Open Heart Surgery;  Laterality: N/A;  . NO PAST SURGERIES    . RADIAL ARTERY HARVEST Left 07/29/2020   Procedure: RADIAL ARTERY HARVEST;  Surgeon: Linden Dolin, MD;  Location: [facility] OR;  Service: Open Heart Surgery;  Laterality: Left;  . TEE WITHOUT CARDIOVERSION N/A 07/29/2020   Procedure: TRANSESOPHAGEAL ECHOCARDIOGRAM (TEE);  Surgeon: Linden Dolin, MD;  Location: [facility] OR;  Service: Open Heart Surgery;  Laterality: N/A;    Family History  Problem Relation Age of Onset  . Hypertension Other     Social History   Tobacco Use  . Smoking status: Never Smoker  . Smokeless tobacco: Never Used  Vaping Use  . Vaping Use: Never used  Substance Use Topics  . Alcohol use: No  . Drug use: No    Prior to Admission medications   Medication Sig Start Date End Date Taking? Authorizing Provider  acetaminophen (TYLENOL) 500 MG tablet Take 1,000 mg by mouth every 6 (six) hours as needed for headache (pain).   Yes [provider]  amLODipine (NORVASC) 10 MG tablet Take 5 mg by mouth at bedtime.    Yes [provider]  Ensure (ENSURE) Take 1 Can by mouth See admin instructions. Drink 1 can of VANILLA ENSURE by mouth once a day   Yes [provider]  loperamide (IMODIUM A-D) 2 MG tablet Take 2 mg by mouth 4 (four) times daily as needed for diarrhea or loose stools.   Yes [provider]  olmesartan (BENICAR) 40 MG tablet Take 40 mg by mouth in the morning.    Yes [provider]  rosuvastatin (CRESTOR) 40 MG tablet Take 1 tablet (40 mg total) by mouth daily. Patient taking differently: Take 40 mg by mouth at bedtime.  08/05/20  Yes Zimmerman, Donielle M, PA-C  XARELTO 20 MG TABS tablet TAKE 1 TABLET BY MOUTH IN  THE EVENING AFTER DINNER Patient taking differently: Take 20 mg by mouth daily after supper.  04/01/20  Yes Tolia, Sunit, DO  metoprolol tartrate (LOPRESSOR) 25 MG tablet Take 0.5 tablets (12.5 mg total) by mouth daily. Patient taking differently: Take 12.5 mg by mouth in the morning.  08/26/20 11/24/20  Tolia, Sunit, DO  potassium chloride SA (KLOR-CON) 20 MEQ tablet Take 1 tablet (20 mEq total) by mouth 2 (two) times daily. 09/14/20   Vanetta Mulders, MD    Current Facility-Administered Medications  Medication Dose Route Frequency Provider Last Rate Last Admin  . 0.9 %  sodium chloride infusion   Intravenous Continuous Howerter, Justin B, DO 125 mL/hr at 09/15/20 0325 New Bag at 09/15/20 0325  . ondansetron (ZOFRAN) injection 4 mg  4 mg Intravenous Q6H PRN Howerter, Justin B, DO      . piperacillin-tazobactam (ZOSYN) IVPB 3.375 g  3.375 g Intravenous Q8H Pham, Anh P, RPH 12.5 mL/hr at 09/15/20 0513 3.375 g at 09/15/20 0513    Allergies as of 09/14/2020  . (No Known Allergies)     Review of Systems:    Constitutional: No weight loss, fever or chills Skin: No rash Cardiovascular: No chest pain Respiratory: No SOB  Gastrointestinal: See HPI and otherwise  negative Genitourinary: No dysuria  Neurological: No headache, dizziness or syncope Musculoskeletal: No new muscle or joint pain Hematologic: No bleeding or bruising Psychiatric: No history of depression or anxiety    Physical Exam:  Vital signs in last 24 hours: Temp:  [97.5 F (36.4 C)-97.8 F (36.6 C)] 97.5 F (36.4 C) (11/08 0702) Pulse Rate:  [42-97] 78 (11/08 0702) Resp:  [13-26] 18 (11/08 0702) BP: (63-135)/(38-98) 135/58 (11/08 0702) SpO2:  [96 %-100 %] 98 % (11/08 0702) Weight:  [104.3 kg] 104.3 kg (11/07 1114) Last BM Date: 09/14/20 General:   Pleasant Caucasian male appears to be in NAD, Well developed, Well nourished, alert and cooperative Head:  Normocephalic and atraumatic. Eyes:   PEERL, EOMI. No icterus. Conjunctiva pink. Ears:  Normal auditory acuity. Neck:  Supple Throat: Oral cavity and  pharynx without inflammation, swelling or lesion. Teeth in good condition. Lungs: Respirations even and unlabored. Lungs clear to auscultation bilaterally.   No wheezes, crackles, or rhonchi.  Heart: Normal S1, S2. No MRG. Regular rate and rhythm. No peripheral edema, cyanosis or pallor.  Abdomen:  Soft, nondistended, nontender. No rebound or guarding. Normal bowel sounds. No appreciable masses or hepatomegaly. Rectal:  Not performed.  Msk:  Symmetrical without gross deformities. Peripheral pulses intact.  Extremities:  Without edema, no deformity or joint abnormality Neurologic:  Alert and  oriented x4;  grossly normal neurologically.  Skin:   Dry and intact without significant lesions or rashes. Psychiatric: Demonstrates good judgement and reason without abnormal affect or behaviors.   LAB RESULTS: Recent Labs    09/14/20 1240 09/15/20 0441  WBC 13.4* 10.4  HGB 13.1 11.4*  HCT 42.5 38.6*  PLT 486* 376   BMET Recent Labs    09/14/20 1240 09/15/20 0441  NA 136 137  K 3.1* 3.4*  CL 97* 103  CO2 23 22  GLUCOSE 103* 79  BUN 15 16  CREATININE 1.36* 1.02  CALCIUM  8.3* 7.8*   LFT Recent Labs    09/15/20 0441  PROT 5.9*  ALBUMIN 2.4*  AST 61*  ALT 61*  ALKPHOS 690*  BILITOT 1.6*  BILIDIR 0.4*   PT/INR Recent Labs    09/15/20 0441  LABPROT 19.5*  INR 1.7*    STUDIES: CT Abdomen Pelvis W Contrast  Result Date: 09/14/2020 CLINICAL DATA:  Diarrhea after surgery, fatigue, weakness, history atrial fibrillation, coronary artery disease, hypertension, post CABG and Maze procedure on 07/29/2020 EXAM: CT ABDOMEN AND PELVIS WITH CONTRAST TECHNIQUE: Multidetector CT imaging of the abdomen and pelvis was performed using the standard protocol following bolus administration of intravenous contrast. Sagittal and coronal MPR images reconstructed from axial data set. CONTRAST:  100mL OMNIPAQUE IOHEXOL 300 MG/ML SOLN IV. No oral contrast. COMPARISON:  07/16/2017 FINDINGS: Lower chest: Small to moderate LEFT pleural effusion with partial atelectasis of LEFT lower lobe. Minimal RIGHT pleural effusion and basilar atelectasis dependently. Calcified granuloma RIGHT lower lobe. Hepatobiliary: Distended gallbladder with wall thickening and pericholecystic infiltrative changes concerning for acute cholecystitis. No definite calcified gallstones. Dilated CBD up to 19 mm diameter to ampulla. No definite calcification at duodenal junction though questionable wall thickening at the ampulla is present. Intrahepatic biliary dilatation present. No focal hepatic mass lesion. Pancreas: No focal abnormalities Spleen: Normal appearance Adrenals/Urinary Tract: 5 mm calculus at central LEFT kidney. Cortical scarring LEFT kidney. Adrenal glands, kidneys, and ureters otherwise normal appearance. Bladder contains small amount of urine and extends into a RIGHT inguinal hernia. Stomach/Bowel: Minimal colonic diverticulosis without evidence of diverticulitis. Normal appendix. Stomach decompressed containing radiodense material question medication. Remaining bowel loops unremarkable.  Vascular/Lymphatic: Atherosclerotic calcifications aorta and iliac arteries without aneurysm. No adenopathy Reproductive: Unremarkable prostate gland and seminal vesicles. Other: RIGHT inguinal hernia containing fat and portion of the urinary bladder. No bowel herniation. No additional hernia is identified. No free air or free fluid. Musculoskeletal: Osseous demineralization. IMPRESSION: Distended gallbladder with wall thickening and pericholecystic infiltrative changes concerning for acute cholecystitis. Dilated CBD up to 19 mm diameter with intrahepatic biliary dilatation, recommend correlation with LFTs; cause of biliary dilatation is uncertain, without calcification evident, could be due to noncalcified stone, mass, or stricture. This could be assessed by MRCP imaging for further evaluation. RIGHT inguinal hernia containing fat and a portion of the urinary bladder. 5 minutes mm nonobstructing LEFT renal calculus. Small to moderate LEFT and  minimal RIGHT pleural effusions with bibasilar atelectasis greater on LEFT. Minimal colonic diverticulosis without evidence of diverticulitis. Aortic Atherosclerosis (ICD10-I70.0). Electronically Signed   By: Ulyses Southward M.D.   On: 09/14/2020 15:17   DG Chest Port 1 View  Result Date: 09/14/2020 CLINICAL DATA:  Acute cholecystitis, diarrhea for 2 months, abdominal distension EXAM: PORTABLE CHEST 1 VIEW COMPARISON:  09/01/2020 FINDINGS: Single frontal view of the chest demonstrates a stable cardiac silhouette. Chronic left basilar consolidation and effusion, stable. Right chest is clear. No pneumothorax. Chronic postsurgical changes from median sternotomy. IMPRESSION: 1. Persistent left basilar consolidation and effusion. Electronically Signed   By: Sharlet Salina M.D.   On: 09/14/2020 16:41    Impression / Plan:   Impression: 1.  Sepsis due to acute cholecystitis:  acute transaminitis, CT abdomen pelvis revealing evidence of acute cholecystitis as well as dilated  common bile duct ; concern for choledocholithiasis 2.  Elevated LFTs: Related to above 3.  Hypokalemia: Improving 4.  AKI 5.  CAD: Status post four-vessel CABG on 07/29/2020 on Xarelto 6.  Paroxysmal A. fib: Status post Maze procedure 07/29/2020 on Xarelto  Plan: 1.  Ordered MRCP for the patient for further evaluation of possible choledocholithiasis. 2.  Patient will remain n.p.o. for now, can likely eat after time of imaging if there are no plans for surgery. 3.  Patient may benefit from further stool studies in the near future. 4.  Please await any further recommendations from Dr. Orvan Falconer later today.  Thank you for your kind consultation, we will continue to follow.  Violet Baldy [facility]  09/15/2020, 8:31 AM

## 2020-09-15 NOTE — Progress Notes (Signed)
Initial Nutrition Assessment  INTERVENTION:   Once diet advanced: -Boost Breeze po TID, each supplement provides 250 kcal and 9 grams of protein  NUTRITION DIAGNOSIS:   Increased nutrient needs related to poor appetite, diarrhea as evidenced by estimated needs.  GOAL:   Patient will meet greater than or equal to 90% of their needs  MONITOR:   PO intake, Supplement acceptance, Labs, Weight trends, I & O's, Diet advancement  REASON FOR ASSESSMENT:   Malnutrition Screening Tool    ASSESSMENT:   69 y.o. male with medical history significant for coronary artery disease status post four-vessel CABG in September 2021, known left bundle branch block, hypertension, paroxysmal atrial fibrillation chronically anticoagulated on Xarelto, who is admitted to [facility] on 09/14/2020 as transfer from med [facility] emergency department with sepsis due to suspected acute cholecystitis presenting from home to the latter facility complaining of diarrhea.  Patient in room, awaiting MRCP. Pt states he feels ready to eat but currently NPO for procedures. Answered all of pt's diet related questions. Pt is agreeable to trying protein shakes once diet is advanced.  Pt reports he has had ongoing diarrhea for 1-2 months since his heart surgery in September 2021. Pt tried to follow the BRAT diet but this didn't help. C.diff negative. Pt did have some instances of N/V PTA.  Per weight records, pt has lost 33 lbs since 9/29 (12% wt loss x 1.5 months, significant for time frame).   Medications: D5 infusion  Labs reviewed: Low K  NUTRITION - FOCUSED PHYSICAL EXAM:  No depletions noted.  Diet Order:   Diet Order            Diet NPO time specified  Diet effective now                 EDUCATION NEEDS:   Education needs have been addressed  Skin:  Skin Assessment: Reviewed RN Assessment  Last BM:  11/8 -type 7  Height:   Ht Readings from Last 1 Encounters:  09/14/20 6'  2" (1.88 m)    Weight:   Wt Readings from Last 1 Encounters:  09/14/20 104.3 kg    BMI:  Body mass index is 29.53 kg/m.  Estimated Nutritional Needs:   Kcal:  2500-2700  Protein:  115-125g  Fluid:  2.5L/day  Tilda Franco, MS, RD, LDN Inpatient Clinical Dietitian Contact information available via Amion

## 2020-09-15 NOTE — Consult Note (Signed)
Consultation  Referring Provider: Dr. Tyson Babinski     Primary Care Physician:  [name], PA-C Primary Gastroenterologist: Gentry Fitz       Reason for Consultation: Elevated LFTs             HPI:   [name] [name] is a 69 y.o. male with a past medical history significant for CAD status post four-vessel CABG in September 2021, known left bundle branch block, paroxysmal A. fib on Xarelto, who was admitted to the hospital on 09/14/2020 as a transfer from med [name] with sepsis due to suspected acute cholecystitis after presenting there with a complaint of diarrhea.    Today, the patient reports approximately 1 month of loose stools of increasing frequency, over the past week continuous episodes of loose stool per day regardless of Imodium.  Explains that at first he just thought this was after his stent procedure for his heart as they discussed loose stools afterwards which could be a consequence.  Tells me he was tested for C. difficile and this was negative.  Explains that he presented to the ER on a couple of occasions because he would become "dehydrated".  At time of my exam this morning he denies any right upper quadrant pain or nausea, but does describe 1 episode of vomiting 2 days ago.    Denies fever, chills, weight loss or blood in his stools.  ED course: Alkaline phosphatase 796 (398 on 10/28), AST 105 (95 on 10/28), ALT 78 (47 on 10/28), total bilirubin 1.6 (3.7 on 10/28), lipase 36, WBC 13,400, Covid negative, CT abdomen pelvis with contrast showed evidence of a distended gallbladder with gallbladder wall thickening and pericholecystic fluid changes concerning for acute cholecystitis, also common bile duct dilated up to 19 mm with intrahepatic biliary dilation and absence of overt choledocholithiasis  Past Medical History:  Diagnosis Date  . Atrial fibrillation (HCC)   . Coronary artery disease   . Dysrhythmia   . Hypertension   . LBBB (left bundle branch block)      Past Surgical History:  Procedure Laterality Date  . CARDIOVERSION N/A 09/28/2016   Procedure: CARDIOVERSION;  Surgeon: Yates Decamp, MD;  Location: [facility] ENDOSCOPY;  Service: Cardiovascular;  Laterality: N/A;  . CLIPPING OF ATRIAL APPENDAGE Left 07/29/2020   Procedure: CLIPPING OF ATRIAL APPENDAGE;  Surgeon: Linden Dolin, MD;  Location: [facility] OR;  Service: Open Heart Surgery;  Laterality: Left;  . CORONARY ARTERY BYPASS GRAFT N/A 07/29/2020   Procedure: CORONARY ARTERY BYPASS GRAFTING (CABG) TIMES FOUR USING BILATERAL INTERNAL MAMMARIES AND LEFT RADIAL ARTERY;  Surgeon: Linden Dolin, MD;  Location: [facility] OR;  Service: Open Heart Surgery;  Laterality: N/A;  . LEFT HEART CATH AND CORONARY ANGIOGRAPHY N/A 07/25/2020   Procedure: LEFT HEART CATH AND CORONARY ANGIOGRAPHY;  Surgeon: Yates Decamp, MD;  Location: [facility] INVASIVE CV LAB;  Service: Cardiovascular;  Laterality: N/A;  . MAZE N/A 07/29/2020   Procedure: MAZE;  Surgeon: Linden Dolin, MD;  Location: [facility] OR;  Service: Open Heart Surgery;  Laterality: N/A;  . NO PAST SURGERIES    . RADIAL ARTERY HARVEST Left 07/29/2020   Procedure: RADIAL ARTERY HARVEST;  Surgeon: Linden Dolin, MD;  Location: [facility] OR;  Service: Open Heart Surgery;  Laterality: Left;  . TEE WITHOUT CARDIOVERSION N/A 07/29/2020   Procedure: TRANSESOPHAGEAL ECHOCARDIOGRAM (TEE);  Surgeon: Linden Dolin, MD;  Location: [facility] OR;  Service: Open Heart Surgery;  Laterality: N/A;    Family History  Problem Relation Age of Onset  . Hypertension Other     Social History   Tobacco Use  . Smoking status: Never Smoker  . Smokeless tobacco: Never Used  Vaping Use  . Vaping Use: Never used  Substance Use Topics  . Alcohol use: No  . Drug use: No    Prior to Admission medications   Medication Sig Start Date End Date Taking? Authorizing Provider  acetaminophen (TYLENOL) 500 MG tablet Take 1,000 mg by mouth every 6 (six) hours as needed for headache (pain).   Yes [provider]  amLODipine (NORVASC) 10 MG tablet Take 5 mg by mouth at bedtime.    Yes [provider]  Ensure (ENSURE) Take 1 Can by mouth See admin instructions. Drink 1 can of VANILLA ENSURE by mouth once a day   Yes [provider]  loperamide (IMODIUM A-D) 2 MG tablet Take 2 mg by mouth 4 (four) times daily as needed for diarrhea or loose stools.   Yes [provider]  olmesartan (BENICAR) 40 MG tablet Take 40 mg by mouth in the morning.    Yes [provider]  rosuvastatin (CRESTOR) 40 MG tablet Take 1 tablet (40 mg total) by mouth daily. Patient taking differently: Take 40 mg by mouth at bedtime.  08/05/20  Yes Zimmerman, Donielle M, PA-C  XARELTO 20 MG TABS tablet TAKE 1 TABLET BY MOUTH IN  THE EVENING AFTER DINNER Patient taking differently: Take 20 mg by mouth daily after supper.  04/01/20  Yes Tolia, Sunit, DO  metoprolol tartrate (LOPRESSOR) 25 MG tablet Take 0.5 tablets (12.5 mg total) by mouth daily. Patient taking differently: Take 12.5 mg by mouth in the morning.  08/26/20 11/24/20  Tolia, Sunit, DO  potassium chloride SA (KLOR-CON) 20 MEQ tablet Take 1 tablet (20 mEq total) by mouth 2 (two) times daily. 09/14/20   Vanetta Mulders, MD    Current Facility-Administered Medications  Medication Dose Route Frequency Provider Last Rate Last Admin  . 0.9 %  sodium chloride infusion   Intravenous Continuous Howerter, Justin B, DO 125 mL/hr at 09/15/20 0325 New Bag at 09/15/20 0325  . ondansetron (ZOFRAN) injection 4 mg  4 mg Intravenous Q6H PRN Howerter, Justin B, DO      . piperacillin-tazobactam (ZOSYN) IVPB 3.375 g  3.375 g Intravenous Q8H Pham, Anh P, RPH 12.5 mL/hr at 09/15/20 0513 3.375 g at 09/15/20 0513    Allergies as of 09/14/2020  . (No Known Allergies)     Review of Systems:    Constitutional: No weight loss, fever or chills Skin: No rash Cardiovascular: No chest pain Respiratory: No SOB  Gastrointestinal: See HPI and otherwise  negative Genitourinary: No dysuria  Neurological: No headache, dizziness or syncope Musculoskeletal: No new muscle or joint pain Hematologic: No bleeding or bruising Psychiatric: No history of depression or anxiety    Physical Exam:  Vital signs in last 24 hours: Temp:  [97.5 F (36.4 C)-97.8 F (36.6 C)] 97.5 F (36.4 C) (11/08 0702) Pulse Rate:  [42-97] 78 (11/08 0702) Resp:  [13-26] 18 (11/08 0702) BP: (63-135)/(38-98) 135/58 (11/08 0702) SpO2:  [96 %-100 %] 98 % (11/08 0702) Weight:  [104.3 kg] 104.3 kg (11/07 1114) Last BM Date: 09/14/20 General:   Pleasant Caucasian male appears to be in NAD, Well developed, Well nourished, alert and cooperative Head:  Normocephalic and atraumatic. Eyes:   PEERL, EOMI. No icterus. Conjunctiva pink. Ears:  Normal auditory acuity. Neck:  Supple Throat: Oral cavity and  pharynx without inflammation, swelling or lesion. Teeth in good condition. Lungs: Respirations even and unlabored. Lungs clear to auscultation bilaterally.   No wheezes, crackles, or rhonchi.  Heart: Normal S1, S2. No MRG. Regular rate and rhythm. No peripheral edema, cyanosis or pallor.  Abdomen:  Soft, nondistended, nontender. No rebound or guarding. Normal bowel sounds. No appreciable masses or hepatomegaly. Rectal:  Not performed.  Msk:  Symmetrical without gross deformities. Peripheral pulses intact.  Extremities:  Without edema, no deformity or joint abnormality Neurologic:  Alert and  oriented x4;  grossly normal neurologically.  Skin:   Dry and intact without significant lesions or rashes. Psychiatric: Demonstrates good judgement and reason without abnormal affect or behaviors.   LAB RESULTS: Recent Labs    09/14/20 1240 09/15/20 0441  WBC 13.4* 10.4  HGB 13.1 11.4*  HCT 42.5 38.6*  PLT 486* 376   BMET Recent Labs    09/14/20 1240 09/15/20 0441  NA 136 137  K 3.1* 3.4*  CL 97* 103  CO2 23 22  GLUCOSE 103* 79  BUN 15 16  CREATININE 1.36* 1.02  CALCIUM  8.3* 7.8*   LFT Recent Labs    09/15/20 0441  PROT 5.9*  ALBUMIN 2.4*  AST 61*  ALT 61*  ALKPHOS 690*  BILITOT 1.6*  BILIDIR 0.4*   PT/INR Recent Labs    09/15/20 0441  LABPROT 19.5*  INR 1.7*    STUDIES: CT Abdomen Pelvis W Contrast  Result Date: 09/14/2020 CLINICAL DATA:  Diarrhea after surgery, fatigue, weakness, history atrial fibrillation, coronary artery disease, hypertension, post CABG and Maze procedure on 07/29/2020 EXAM: CT ABDOMEN AND PELVIS WITH CONTRAST TECHNIQUE: Multidetector CT imaging of the abdomen and pelvis was performed using the standard protocol following bolus administration of intravenous contrast. Sagittal and coronal MPR images reconstructed from axial data set. CONTRAST:  100mL OMNIPAQUE IOHEXOL 300 MG/ML SOLN IV. No oral contrast. COMPARISON:  07/16/2017 FINDINGS: Lower chest: Small to moderate LEFT pleural effusion with partial atelectasis of LEFT lower lobe. Minimal RIGHT pleural effusion and basilar atelectasis dependently. Calcified granuloma RIGHT lower lobe. Hepatobiliary: Distended gallbladder with wall thickening and pericholecystic infiltrative changes concerning for acute cholecystitis. No definite calcified gallstones. Dilated CBD up to 19 mm diameter to ampulla. No definite calcification at duodenal junction though questionable wall thickening at the ampulla is present. Intrahepatic biliary dilatation present. No focal hepatic mass lesion. Pancreas: No focal abnormalities Spleen: Normal appearance Adrenals/Urinary Tract: 5 mm calculus at central LEFT kidney. Cortical scarring LEFT kidney. Adrenal glands, kidneys, and ureters otherwise normal appearance. Bladder contains small amount of urine and extends into a RIGHT inguinal hernia. Stomach/Bowel: Minimal colonic diverticulosis without evidence of diverticulitis. Normal appendix. Stomach decompressed containing radiodense material question medication. Remaining bowel loops unremarkable.  Vascular/Lymphatic: Atherosclerotic calcifications aorta and iliac arteries without aneurysm. No adenopathy Reproductive: Unremarkable prostate gland and seminal vesicles. Other: RIGHT inguinal hernia containing fat and portion of the urinary bladder. No bowel herniation. No additional hernia is identified. No free air or free fluid. Musculoskeletal: Osseous demineralization. IMPRESSION: Distended gallbladder with wall thickening and pericholecystic infiltrative changes concerning for acute cholecystitis. Dilated CBD up to 19 mm diameter with intrahepatic biliary dilatation, recommend correlation with LFTs; cause of biliary dilatation is uncertain, without calcification evident, could be due to noncalcified stone, mass, or stricture. This could be assessed by MRCP imaging for further evaluation. RIGHT inguinal hernia containing fat and a portion of the urinary bladder. 5 minutes mm nonobstructing LEFT renal calculus. Small to moderate LEFT and   minimal RIGHT pleural effusions with bibasilar atelectasis greater on LEFT. Minimal colonic diverticulosis without evidence of diverticulitis. Aortic Atherosclerosis (ICD10-I70.0). Electronically Signed   By: Ulyses Southward M.D.   On: 09/14/2020 15:17   DG Chest Port 1 View  Result Date: 09/14/2020 CLINICAL DATA:  Acute cholecystitis, diarrhea for 2 months, abdominal distension EXAM: PORTABLE CHEST 1 VIEW COMPARISON:  09/01/2020 FINDINGS: Single frontal view of the chest demonstrates a stable cardiac silhouette. Chronic left basilar consolidation and effusion, stable. Right chest is clear. No pneumothorax. Chronic postsurgical changes from median sternotomy. IMPRESSION: 1. Persistent left basilar consolidation and effusion. Electronically Signed   By: Sharlet Salina M.D.   On: 09/14/2020 16:41    Impression / Plan:   Impression: 1.  Sepsis due to acute cholecystitis:  acute transaminitis, CT abdomen pelvis revealing evidence of acute cholecystitis as well as dilated  common bile duct ; concern for choledocholithiasis 2.  Elevated LFTs: Related to above 3.  Hypokalemia: Improving 4.  AKI 5.  CAD: Status post four-vessel CABG on 07/29/2020 on Xarelto 6.  Paroxysmal A. fib: Status post Maze procedure 07/29/2020 on Xarelto  Plan: 1.  Ordered MRCP for the patient for further evaluation of possible choledocholithiasis. 2.  Patient will remain n.p.o. for now, can likely eat after time of imaging if there are no plans for surgery. 3.  Patient may benefit from further stool studies in the near future. 4.  Please await any further recommendations from Dr. Orvan Falconer later today.  Thank you for your kind consultation, we will continue to follow.  Violet Baldy [facility]  09/15/2020, 8:31 AM

## 2020-09-15 NOTE — Plan of Care (Signed)
  Problem: Clinical Measurements: Goal: Will remain free from infection Outcome: Progressing Goal: Diagnostic test results will improve Outcome: Progressing   Problem: Activity: Goal: Risk for activity intolerance will decrease Outcome: Progressing   Problem: Nutrition: Goal: Adequate nutrition will be maintained Outcome: Progressing   Problem: Elimination: Goal: Will not experience complications related to bowel motility Outcome: Progressing Goal: Will not experience complications related to urinary retention Outcome: Progressing   Problem: Safety: Goal: Ability to remain free from injury will improve Outcome: Progressing   Problem: Skin Integrity: Goal: Risk for impaired skin integrity will decrease Outcome: Progressing   Problem: Education: Goal: Knowledge of General Education information will improve Description: Including pain rating scale, medication(s)/side effects and non-pharmacologic comfort measures Outcome: Progressing   Problem: Health Behavior/Discharge Planning: Goal: Ability to manage health-related needs will improve Outcome: Progressing   Problem: Clinical Measurements: Goal: Ability to maintain clinical measurements within normal limits will improve Outcome: Progressing Goal: Will remain free from infection Outcome: Progressing Goal: Diagnostic test results will improve Outcome: Progressing Goal: Respiratory complications will improve Outcome: Progressing Goal: Cardiovascular complication will be avoided Outcome: Progressing   Problem: Activity: Goal: Risk for activity intolerance will decrease Outcome: Progressing   Problem: Elimination: Goal: Will not experience complications related to bowel motility Outcome: Progressing Goal: Will not experience complications related to urinary retention Outcome: Progressing   Problem: Pain Managment: Goal: General experience of comfort will improve Outcome: Progressing   Problem: Skin  Integrity: Goal: Risk for impaired skin integrity will decrease Outcome: Progressing

## 2020-09-15 NOTE — TOC Progression Note (Signed)
Transition of Care [facility]) - Progression Note    Patient Details  Name: [name] [name] MRN: [id number] Date of Birth: Sep 20, 1951  Transition of Care [facility]) CM/SW Contact  Geni Bers, RN Phone Number: 09/15/2020, 12:13 PM  Clinical Narrative:     Pt from home alone with support from [facility] family. TOC will continue to follow.   Expected Discharge Plan: Home/Self Care Barriers to Discharge: No Barriers Identified  Expected Discharge Plan and Services Expected Discharge Plan: Home/Self Care       Living arrangements for the past 2 months: Single Family Home                                       Social Determinants of Health (SDOH) Interventions    Readmission Risk Interventions No flowsheet data found.

## 2020-09-15 NOTE — Progress Notes (Signed)
PROGRESS NOTE  [name] [name] ZOX:[id number] DOB: 04/26/1951 DOA: 09/14/2020 PCP: Richmond CampbellKaplan, Kristen W., PA-C   LOS: 1 day   Brief narrative: As per HPI,  [name] BankerJames R Dingley is a 69 y.o. male with medical history significant for coronary artery disease status post four-vessel CABG in September 2021, known left bundle branch block, hypertension, paroxysmal atrial fibrillation chronically anticoagulated on Xarelto, [facility] with complaints of diarrhea abdominal pain.  He was recently evaluated at [facility] department on 08/27/2020.  He did have ultrasound which showed gallbladder sludge but no acute cholecystitis.  He was instructed to follow-up with GI as outpatient. Of note, the patient underwent four-vessel CABG on 07/29/2020, with associated hospitalization from 07/23/2020 to 08/05/2020.  He also has a history of paroxysmal atrial fibrillation for which she underwent Maze procedure on 07/29/2020, and is chronically anticoagulated on Xarelto.  Patient was then admitted to hospital for GI evaluation and surgical input.   Assessment/Plan:  Principal Problem:   Acute cholecystitis Active Problems:   Atrial fibrillation (HCC)   Pure hypercholesterolemia   Hypokalemia   CAD, multiple vessel   AKI (acute kidney injury) (HCC)   Sepsis (HCC)   Abdominal pain  Sepsis due to acute cholecystitis:  Patient had right upper quadrant abdominal pain with elevated LFTs and CT scan showed features of cholecystitis.  C. difficile was negative.  COVID-19 was negative.  General surgery recommending HIDA scan.  Currently on IV Zosyn. GI on board and recommend MRCP.  Will follow GI and surgical input.  Diarrhea for 2 months with recurrent dehydration.  Patient has been hydrated.  Continue to replenish electrolyte  Hypokalemia:  Will replace as necessary. Mild hypokalemia today at 3.4.  Will replace IV since patient is n.p.o. at this time.  Acute kidney injury: On presentation  likely secondary to volume depletion, sepsis.  Possibility of mild ATN.  Patient did have a granular cast and WBC cast as well.  Continue to hold olmesartan.  Trend BMP.  Creatinine of 1.0 today.  History of coronary artery disease Status post four-vessel CABG on 07/29/2020.  Outpatient cardiac medications include Lopressor, olmesartan, rosuvastatin, and Xarelto.   Holding Xarelto  rosuvastatin, olmesartan for now.  No chest pain, shortness of breath.   Paroxysmal atrial fibrillation: Status post Maze procedure on 07/29/2020. CHA2DS2-VASc score of 3, on metoprolol at home.  Holding Xarelto..Blood pressure and heart rate seems to be stable at this time.  Essential hypertension: Was on olmesartan, amlodipine, and Lopressor.  Currently on holddue to n.p.o. status.   DVT prophylaxis: SCDs Start: 09/14/20 2033   Code Status: Full code  Family Communication: I spoke with the patient's brother on the phone and updated him about the clinical condition of the patient.  Status is: Inpatient  Remains inpatient appropriate because:IV treatments appropriate due to intensity of illness or inability to take PO, Inpatient level of care appropriate due to severity of illness and Possible surgical intervention   Dispo: The patient is from: Home              Anticipated d/c is to: Home              Anticipated d/c date is: 2 days              Patient currently is not medically stable to d/c.   Consultants:  General surgery  GI  Procedures:  None  Antibiotics:  . Zosyn iv 11/07>  Anti-infectives (From admission, onward)  Start     Dose/Rate Route Frequency Ordered Stop   09/14/20 2200  piperacillin-tazobactam (ZOSYN) IVPB 3.375 g        3.375 g 12.5 mL/hr over 240 Minutes Intravenous Every 8 hours 09/14/20 2055     09/14/20 1600  piperacillin-tazobactam (ZOSYN) IVPB 3.375 g        3.375 g 100 mL/hr over 30 Minutes Intravenous  Once 09/14/20 1558 09/14/20 1719       Subjective: Today, patient was seen and examined at bedside. Denies obvious pain fever, chest pain.  Had loose bowel movement yesterday.  Feels ok.  Denies any nausea, vomiting today.  N.p.o. currently for MRCP.  Objective: Vitals:   09/15/20 0304 09/15/20 0702  BP: 132/62 (!) 135/58  Pulse: 79 78  Resp: 18 18  Temp: (!) 97.5 F (36.4 C) (!) 97.5 F (36.4 C)  SpO2: 100% 98%    Intake/Output Summary (Last 24 hours) at 09/15/2020 0732 Last data filed at 09/15/2020 0600 Gross per 24 hour  Intake 1425.13 ml  Output 700 ml  Net 725.13 ml   Filed Weights   09/14/20 1114  Weight: 104.3 kg   Body mass index is 29.53 kg/m.   Physical Exam:  GENERAL: Patient is alert awake and oriented. Not in obvious distress. HENT: No scleral pallor or icterus. Pupils equally reactive to light. Oral mucosa is moist NECK: is supple, no gross swelling noted. CHEST: Clear to auscultation. No crackles or wheezes.  Diminished breath sounds bilaterally. CVS: S1 and S2 heard, no murmur. Regular rate and rhythm.  ABDOMEN: Soft, mild tenderness over the right upper quadrant bowel sounds are present. EXTREMITIES: No edema. CNS: Cranial nerves are intact. No focal motor deficits. SKIN: warm and dry without rashes.  Data Review: I have personally reviewed the following laboratory data and studies,  CBC: Recent Labs  Lab 09/14/20 1240 09/15/20 0441  WBC 13.4* 10.4  NEUTROABS  --  7.9*  HGB 13.1 11.4*  HCT 42.5 38.6*  MCV 91.2 95.8  PLT 486* 376   Basic Metabolic Panel: Recent Labs  Lab 09/14/20 1240 09/15/20 0006 09/15/20 0441  NA 136  --  137  K 3.1*  --  3.4*  CL 97*  --  103  CO2 23  --  22  GLUCOSE 103*  --  79  BUN 15  --  16  CREATININE 1.36*  --  1.02  CALCIUM 8.3*  --  7.8*  MG  --  1.9 1.9   Liver Function Tests: Recent Labs  Lab 09/14/20 1240 09/15/20 0441  AST 105* 61*  ALT 78* 61*  ALKPHOS 796* 690*  BILITOT 1.6* 1.6*  PROT 6.9 5.9*  ALBUMIN 2.5* 2.4*    Recent Labs  Lab 09/14/20 1240  LIPASE 36   No results for input(s): AMMONIA in the last 168 hours. Cardiac Enzymes: No results for input(s): CKTOTAL, CKMB, CKMBINDEX, TROPONINI in the last 168 hours. BNP (last 3 results) Recent Labs    07/24/20 0748  BNP 206.5*    ProBNP (last 3 results) No results for input(s): PROBNP in the last 8760 hours.  CBG: No results for input(s): GLUCAP in the last 168 hours. Recent Results (from the past 240 hour(s))  C Difficile Quick Screen w PCR reflex     Status: None   Collection Time: 09/14/20  2:20 PM   Specimen: STOOL  Result Value Ref Range Status   C Diff antigen NEGATIVE NEGATIVE Final   C Diff toxin NEGATIVE NEGATIVE Final  C Diff interpretation No C. difficile detected.  Final    Comment: Performed at [facility], [address], [place], [place] [address]  Respiratory Panel by RT PCR (Flu A&B, Covid) - Nasopharyngeal Swab     Status: None   Collection Time: 09/14/20  4:04 PM   Specimen: Nasopharyngeal Swab  Result Value Ref Range Status   SARS Coronavirus 2 by RT PCR NEGATIVE NEGATIVE Final    Comment: (NOTE) SARS-CoV-2 target nucleic acids are NOT DETECTED.  The SARS-CoV-2 RNA is generally detectable in upper respiratoy specimens during the acute phase of infection. The lowest concentration of SARS-CoV-2 viral copies this assay can detect is 131 copies/mL. A negative result does not preclude SARS-Cov-2 infection and should not be used as the sole basis for treatment or other patient management decisions. A negative result may occur with  improper specimen collection/handling, submission of specimen other than nasopharyngeal swab, presence of viral mutation(s) within the areas targeted by this assay, and inadequate number of viral copies (<131 copies/mL). A negative result must be combined with clinical observations, patient history, and epidemiological information. The expected result is Negative.  Fact Sheet  for Patients:  https://www.moore.com/  Fact Sheet for Healthcare Providers:  https://www.young.biz/  This test is no t yet approved or cleared by the [place] FDA and  has been authorized for detection and/or diagnosis of SARS-CoV-2 by FDA under an Emergency Use Authorization (EUA). This EUA will remain  in effect (meaning this test can be used) for the duration of the COVID-19 declaration under Section 564(b)(1) of the Act, 21 U.S.C. section 360bbb-3(b)(1), unless the authorization is terminated or revoked sooner.     Influenza A by PCR NEGATIVE NEGATIVE Final   Influenza B by PCR NEGATIVE NEGATIVE Final    Comment: (NOTE) The Xpert Xpress SARS-CoV-2/FLU/RSV assay is intended as an aid in  the diagnosis of influenza from Nasopharyngeal swab specimens and  should not be used as a sole basis for treatment. Nasal washings and  aspirates are unacceptable for Xpert Xpress SARS-CoV-2/FLU/RSV  testing.  Fact Sheet for Patients: https://www.moore.com/  Fact Sheet for Healthcare Providers: https://www.young.biz/  This test is not yet approved or cleared by the [place] FDA and  has been authorized for detection and/or diagnosis of SARS-CoV-2 by  FDA under an Emergency Use Authorization (EUA). This EUA will remain  in effect (meaning this test can be used) for the duration of the  Covid-19 declaration under Section 564(b)(1) of the Act, 21  U.S.C. section 360bbb-3(b)(1), unless the authorization is  terminated or revoked. Performed at [facility], [address] [place], [place] [address]      Studies: CT Abdomen Pelvis W Contrast  Result Date: 09/14/2020 CLINICAL DATA:  Diarrhea after surgery, fatigue, weakness, history atrial fibrillation, coronary artery disease, hypertension, post CABG and Maze procedure on 07/29/2020 EXAM: CT ABDOMEN AND PELVIS WITH CONTRAST TECHNIQUE:  Multidetector CT imaging of the abdomen and pelvis was performed using the standard protocol following bolus administration of intravenous contrast. Sagittal and coronal MPR images reconstructed from axial data set. CONTRAST:  OMNIPAQUE IOHEXOL 300 MG/ML SOLN IV. No oral contrast. COMPARISON:  07/16/2017 FINDINGS: Lower chest: Small to moderate LEFT pleural effusion with partial atelectasis of LEFT lower lobe. Minimal RIGHT pleural effusion and basilar atelectasis dependently. Calcified granuloma RIGHT lower lobe. Hepatobiliary: Distended gallbladder with wall thickening and pericholecystic infiltrative changes concerning for acute cholecystitis. No definite calcified gallstones. Dilated CBD up to 19 mm diameter  to ampulla. No definite calcification at duodenal junction though questionable wall thickening at the ampulla is present. Intrahepatic biliary dilatation present. No focal hepatic mass lesion. Pancreas: No focal abnormalities Spleen: Normal appearance Adrenals/Urinary Tract: 5 mm calculus at central LEFT kidney. Cortical scarring LEFT kidney. Adrenal glands, kidneys, and ureters otherwise normal appearance. Bladder contains small amount of urine and extends into a RIGHT inguinal hernia. Stomach/Bowel: Minimal colonic diverticulosis without evidence of diverticulitis. Normal appendix. Stomach decompressed containing radiodense material question medication. Remaining bowel loops unremarkable. Vascular/Lymphatic: Atherosclerotic calcifications aorta and iliac arteries without aneurysm. No adenopathy Reproductive: Unremarkable prostate gland and seminal vesicles. Other: RIGHT inguinal hernia containing fat and portion of the urinary bladder. No bowel herniation. No additional hernia is identified. No free air or free fluid. Musculoskeletal: Osseous demineralization. IMPRESSION: Distended gallbladder with wall thickening and pericholecystic infiltrative changes concerning for acute cholecystitis. Dilated  CBD up to 19 mm diameter with intrahepatic biliary dilatation, recommend correlation with LFTs; cause of biliary dilatation is uncertain, without calcification evident, could be due to noncalcified stone, mass, or stricture. This could be assessed by MRCP imaging for further evaluation. RIGHT inguinal hernia containing fat and a portion of the urinary bladder. 5 minutes mm nonobstructing LEFT renal calculus. Small to moderate LEFT and minimal RIGHT pleural effusions with bibasilar atelectasis greater on LEFT. Minimal colonic diverticulosis without evidence of diverticulitis. Aortic Atherosclerosis (ICD10-I70.0). Electronically Signed   By: Ulyses Southward M.D.   On: 09/14/2020 15:17   DG Chest Port 1 View  Result Date: 09/14/2020 CLINICAL DATA:  Acute cholecystitis, diarrhea for 2 months, abdominal distension EXAM: PORTABLE CHEST 1 VIEW COMPARISON:  09/01/2020 FINDINGS: Single frontal view of the chest demonstrates a stable cardiac silhouette. Chronic left basilar consolidation and effusion, stable. Right chest is clear. No pneumothorax. Chronic postsurgical changes from median sternotomy. IMPRESSION: 1. Persistent left basilar consolidation and effusion. Electronically Signed   By: Sharlet Salina M.D.   On: 09/14/2020 16:41      Joycelyn Das, MD  [facility] 09/15/2020

## 2020-09-15 NOTE — Progress Notes (Addendum)
    CC: Diarrhea x2 months, fatigue/weakness  Subjective:  He complains of recurrent daily diarrhea with subsequent dehydration.  He has been to the ED 3 times for this since his heart surgery.  He denies any abdominal pain, even after meals.  He has no abdominal pain currently.  Objective: Vital signs in last 24 hours: Temp:  [97.5 F (36.4 C)-97.8 F (36.6 C)] 97.5 F (36.4 C) (11/08 0702) Pulse Rate:  [42-97] 78 (11/08 0702) Resp:  [13-26] 18 (11/08 0702) BP: (63-135)/(38-98) 135/58 (11/08 0702) SpO2:  [96 %-100 %] 98 % (11/08 0702) Weight:  [104.3 kg] 104.3 kg (11/07 1114) Last BM Date: 09/14/20 N.p.o. 1425 IV 700 urine BM x1 Afebrile, vital signs are stable Potassium 3.4 CT scan 11/7: Distended gallbladder up to 19 mm with intrahepatic biliary dilatation, cause of biliary dilatation is uncertain without calcification, possible noncalcified stone, mass, stricture. RIGHT inguinal hernia containing fat and a portion of the urinary bladder MRCP pending    Intake/Output from previous day: 11/07 0701 - 11/08 0700 In: 1425.1 [I.V.:1425.1] Out: 700 [Urine:700] Intake/Output this shift: No intake/output data recorded.  General appearance: alert, cooperative and no distress Resp: clear to auscultation bilaterally GI: soft, non-tender; bowel sounds normal; no masses,  no organomegaly  Lab Results:  Recent Labs    09/14/20 1240 09/15/20 0441  WBC 13.4* 10.4  HGB 13.1 11.4*  HCT 42.5 38.6*  PLT 486* 376    BMET Recent Labs    09/14/20 1240 09/15/20 0441  NA 136 137  K 3.1* 3.4*  CL 97* 103  CO2 23 22  GLUCOSE 103* 79  BUN 15 16  CREATININE 1.36* 1.02  CALCIUM 8.3* 7.8*   PT/INR Recent Labs    09/15/20 0441  LABPROT 19.5*  INR 1.7*    Recent Labs  Lab 09/14/20 1240 09/15/20 0441  AST 105* 61*  ALT 78* 61*  ALKPHOS 796* 690*  BILITOT 1.6* 1.6*  PROT 6.9 5.9*  ALBUMIN 2.5* 2.4*     Lipase     Component Value Date/Time   LIPASE 36  09/14/2020 1240     Medications:   . sodium chloride 125 mL/hr at 09/15/20 0325  . piperacillin-tazobactam (ZOSYN)  IV 3.375 g (09/15/20 0513)    Assessment/Plan CAD/CABG x 4 07/29/2020/left bundle branch block  Dr. Bobbe Medico PAF -on Xarelto/last dose 09/14/2020 Hypertension Recurrent dehydration  Diarrhea x 2 months - he has more than 10 BM's per day (all watery) Possible cholecystitis (seen on CT scan on 11/7, but gall stones seen)  Elevated LFTs  CBD dilatation 19 mm Alk phos 796>> 690 AST 105>>61 ALT 78>> 61 T Bil 1.6>>1.6 INR 1.7  FEN: IV fluids/n.p.o. ID: Zosyn 11/7>> day 2 DVT: SCDs only Follow-up: TBD  Plan: Dr. Marlou Starks had recommended a HIDA scan.  He has been seen by GI and MRCP has been ordered.    We will follow with you.     LOS: 1 day   [name],[name] 09/15/2020 Please see Amion  Agree with above. The diarrhea has eased off here, but he is not eating. He is having no abdominal pain. MRCP ordered.  Alphonsa Overall, MD, [facility] Surgery Office phone:  [phone]

## 2020-09-16 DIAGNOSIS — K8042 Calculus of bile duct with acute cholecystitis without obstruction: Secondary | ICD-10-CM

## 2020-09-16 DIAGNOSIS — R1011 Right upper quadrant pain: Secondary | ICD-10-CM | POA: Diagnosis not present

## 2020-09-16 DIAGNOSIS — N179 Acute kidney failure, unspecified: Secondary | ICD-10-CM | POA: Diagnosis not present

## 2020-09-16 DIAGNOSIS — I4821 Permanent atrial fibrillation: Secondary | ICD-10-CM | POA: Diagnosis not present

## 2020-09-16 DIAGNOSIS — R935 Abnormal findings on diagnostic imaging of other abdominal regions, including retroperitoneum: Secondary | ICD-10-CM | POA: Diagnosis not present

## 2020-09-16 DIAGNOSIS — K810 Acute cholecystitis: Secondary | ICD-10-CM

## 2020-09-16 LAB — TSH: TSH: 1.083 u[IU]/mL (ref 0.350–4.500)

## 2020-09-16 LAB — COMPREHENSIVE METABOLIC PANEL
ALT: 51 U/L — ABNORMAL HIGH (ref 0–44)
AST: 41 U/L (ref 15–41)
Albumin: 2.3 g/dL — ABNORMAL LOW (ref 3.5–5.0)
Alkaline Phosphatase: 592 U/L — ABNORMAL HIGH (ref 38–126)
Anion gap: 12 (ref 5–15)
BUN: 11 mg/dL (ref 8–23)
CO2: 22 mmol/L (ref 22–32)
Calcium: 8.3 mg/dL — ABNORMAL LOW (ref 8.9–10.3)
Chloride: 106 mmol/L (ref 98–111)
Creatinine, Ser: 1.11 mg/dL (ref 0.61–1.24)
GFR, Estimated: >60 mL/min (ref >60)
Glucose, Bld: 67 mg/dL — ABNORMAL LOW (ref 70–99)
Potassium: 3.4 mmol/L — ABNORMAL LOW (ref 3.5–5.1)
Sodium: 140 mmol/L (ref 135–145)
Total Bilirubin: 1.5 mg/dL — ABNORMAL HIGH (ref 0.3–1.2)
Total Protein: 6.1 g/dL — ABNORMAL LOW (ref 6.5–8.1)

## 2020-09-16 MED ORDER — AMLODIPINE BESYLATE 5 MG PO TABS
5.0000 mg | ORAL_TABLET | Freq: Every day | ORAL | Status: DC
  Administered 2020-09-16 – 2020-09-21 (×6): 5 mg via ORAL
  Filled 2020-09-16 (×6): qty 1

## 2020-09-16 MED ORDER — SODIUM CHLORIDE 0.9 % IV SOLN: INTRAVENOUS | Status: DC

## 2020-09-16 MED ORDER — BOOST / RESOURCE BREEZE PO LIQD CUSTOM
1.0000 | Freq: Three times a day (TID) | ORAL | Status: DC
  Administered 2020-09-16 – 2020-09-21 (×6): 1 via ORAL

## 2020-09-16 MED ORDER — METOPROLOL TARTRATE 25 MG PO TABS
12.5000 mg | ORAL_TABLET | Freq: Every day | ORAL | Status: DC
  Administered 2020-09-17 – 2020-09-22 (×6): 12.5 mg via ORAL
  Filled 2020-09-16 (×6): qty 1

## 2020-09-16 MED ORDER — VITAMIN K1 10 MG/ML IJ SOLN
5.0000 mg | Freq: Once | INTRAVENOUS | Status: AC
  Administered 2020-09-16: 5 mg via INTRAVENOUS
  Filled 2020-09-16: qty 0.5

## 2020-09-16 NOTE — Anesthesia Preprocedure Evaluation (Addendum)
Anesthesia Evaluation  Patient identified by MRN, date of birth, ID band Patient awake    Reviewed: Allergy & Precautions, NPO status , Patient's Chart, lab work & pertinent test results  Airway Mallampati: III  TM Distance: <3 FB Neck ROM: Full    Dental no notable dental hx. (+) Poor Dentition, Edentulous Upper,    Pulmonary neg pulmonary ROS,    Pulmonary exam normal breath sounds clear to auscultation       Cardiovascular hypertension, Pt. on medications + CAD  Normal cardiovascular exam+ dysrhythmias Atrial Fibrillation  Rhythm:Regular Rate:Normal  9-21 Echo Left Ventricle: The left ventricle has mildly reduced systolic function,  with an ejection fraction of 45-50%. The cavity size was normal. There is  mildly increased left ventricular wall thickness. No evidence of left  ventricular regional wall motion  abnormalities.    Neuro/Psych negative neurological ROS  negative psych ROS   GI/Hepatic negative GI ROS, Neg liver ROS,   Endo/Other    Renal/GU Renal InsufficiencyRenal diseaseK+_ 3.4 Cr 1.11     Musculoskeletal   Abdominal   Peds  Hematology hgb 11.4   Anesthesia Other Findings   Reproductive/Obstetrics                           Anesthesia Physical Anesthesia Plan  ASA: III  Anesthesia Plan: General   Post-op Pain Management:    Induction: Intravenous  PONV Risk Score and Plan: Treatment may vary due to age or medical condition  Airway Management Planned: Oral ETT  Additional Equipment: Spinal Drain  Intra-op Plan:   Post-operative Plan: Extubation in OR  Informed Consent: I have reviewed the patients History and Physical, chart, labs and discussed the procedure including the risks, benefits and alternatives for the proposed anesthesia with the patient or authorized representative who has indicated his/her understanding and acceptance.     Dental advisory  given  Plan Discussed with: CRNA  Anesthesia Plan Comments: (GA for Cholelithiasis)        Anesthesia Quick Evaluation

## 2020-09-16 NOTE — Progress Notes (Signed)
PROGRESS NOTE  [name] Rice CHE:[id number] DOB: 04/29/1951 DOA: 09/14/2020 PCP: [name], PA-C   LOS: 2 days   Brief narrative: As per HPI,  [name] Rice is a 69 y.o. male with medical history significant for coronary artery disease status post four-vessel CABG in September 2021, known left bundle branch block, hypertension, paroxysmal atrial fibrillation chronically anticoagulated on Xarelto, presented to med [facility] with complaints of diarrhea abdominal pain.  He was recently evaluated at [facility] emergency department on 08/27/2020.  He did have ultrasound which showed gallbladder sludge but no acute cholecystitis.  He was instructed to follow-up with GI as outpatient. Of note, the patient underwent four-vessel CABG on 07/29/2020, with associated hospitalization from 07/23/2020 to 08/05/2020.  He also has a history of paroxysmal atrial fibrillation for which he underwent Maze procedure on 07/29/2020, and is chronically anticoagulated on Xarelto.  Patient was then admitted to hospital for GI evaluation and surgical input.  Assessment/Plan:  Principal Problem:   Acute cholecystitis Active Problems:   Atrial fibrillation (HCC)   Pure hypercholesterolemia   Hypokalemia   CAD, multiple vessel   AKI (acute kidney injury) (HCC)   Sepsis (HCC)   Abdominal pain   Abnormal CT of the abdomen   Abnormal liver enzymes  Sepsis due to acute cholecystitis with biliary dilatation:  Patient had right upper quadrant abdominal pain with elevated LFTs and CT scan showed features of acute cholecystitis.  C. difficile was negative.  COVID-19 was negative.  Currently on IV Zosyn for possible cholecystitis.. GI on board and recommend MRCP.  MRCP shows CBD and intrahepatic duct dilation, CBD sludge, features of acute cholecystitis.  Patient has been seen by GI today and plan is ERCP tomorrow afternoon.  Patient will also likely undergo laparoscopic cholecystectomy if okay with cardiology.   Communicated with general surgery.  Diarrhea. Supportive care.  GI on board.  Hypokalemia:  Potassium of 3.4 today.  We will continue to replenish.  Check levels in a.m.  Acute kidney injury:  likely secondary to volume depletion, sepsis.  Improved possibility of mild ATN.  Patient did have a granular cast and WBC cast as well.  Continue to hold olmesartan.  Creatinine of 1.1 today.  History of coronary artery disease Status post four-vessel CABG on 07/29/2020.  Outpatient cardiac medications include Lopressor, olmesartan, rosuvastatin, and Xarelto.   Holding Xarelto  rosuvastatin, olmesartan for now.  No chest pain, shortness of breath.  Could resume metoprolol.   Paroxysmal atrial fibrillation: Status post Maze procedure on 07/29/2020. CHA2DS2-VASc score of 3, on metoprolol at home.  Holding Xarelto.Blood pressure and heart rate seems to be stable at this time.  Will resume metoprolol.  Essential hypertension: Was on olmesartan, amlodipine, and Lopressor.  GI and general surgery has seen the patient today.  Recommend ERCP tomorrow.  Restart metoprolol and amlodipine.  DVT prophylaxis: SCDs Start: 09/14/20 2033.   Code Status: Full code  Family Communication: None today.I spoke with the patient's brother on the phone and updated him about the clinical condition of the patient yesterday..  Status is: Inpatient  Remains inpatient appropriate because:IV treatments appropriate due to intensity of illness or inability to take PO, Inpatient level of care appropriate due to severity of illness and Possible surgical intervention,   Dispo: The patient is from: Home              Anticipated d/c is to: Home  Anticipated d/c date is: 2 to 3 days, follow GI and surgical recommendations.              Patient currently is not medically stable to d/c.  Consultants:  General surgery  GI  Procedures:  None  Antibiotics:  . Zosyn iv 11/07>  Anti-infectives (From admission,  onward)   Start     Dose/Rate Route Frequency Ordered Stop   09/14/20 2200  piperacillin-tazobactam (ZOSYN) IVPB 3.375 g        3.375 g 12.5 mL/hr over 240 Minutes Intravenous Every 8 hours 09/14/20 2055     09/14/20 1600  piperacillin-tazobactam (ZOSYN) IVPB 3.375 g        3.375 g 100 mL/hr over 30 Minutes Intravenous  Once 09/14/20 1558 09/14/20 1719     Subjective: Today, denies any abdominal pain, nausea, vomiting. Denies any shortness of breath, cough, fever or chills.  Objective: Vitals:   09/15/20 2113 09/16/20 0706  BP: 117/65 (!) 147/70  Pulse: 78 71  Resp:  20  Temp: 97.6 F (36.4 C) 98 F (36.7 C)  SpO2: 99% 98%    Intake/Output Summary (Last 24 hours) at 09/16/2020 0733 Last data filed at 09/16/2020 0707 Gross per 24 hour  Intake 1164.47 ml  Output 500 ml  Net 664.47 ml   Filed Weights   09/14/20 1114 09/16/20 0706  Weight: 104.3 kg 105 kg   Body mass index is 29.72 kg/m.   Physical Exam:  General:  Average built, not in obvious distress, communicative HENT:   No scleral pallor or icterus noted. Oral mucosa is moist.  Chest:  Clear breath sounds.  Diminished breath sounds bilaterally. No crackles or wheezes.  CVS: S1 &S2 heard. No murmur.  Regular rate and rhythm. Abdomen: Soft, nontender, nondistended.  Bowel sounds are heard.   Extremities: No cyanosis, clubbing or edema.  Peripheral pulses are palpable. Psych: Alert, awake and oriented, normal mood CNS:  No cranial nerve deficits.  Power equal in all extremities.   Skin: Warm and dry.  No rashes noted.  Data Review: I have personally reviewed the following laboratory data and studies,  CBC: Recent Labs  Lab 09/14/20 1240 09/15/20 0441  WBC 13.4* 10.4  NEUTROABS  --  7.9*  HGB 13.1 11.4*  HCT 42.5 38.6*  MCV 91.2 95.8  PLT 486* 376   Basic Metabolic Panel: Recent Labs  Lab 09/14/20 1240 09/15/20 0006 09/15/20 0441  NA 136  --  137  K 3.1*  --  3.4*  CL 97*  --  103  CO2 23  --  22    GLUCOSE 103*  --  79  BUN 15  --  16  CREATININE 1.36*  --  1.02  CALCIUM 8.3*  --  7.8*  MG  --  1.9 1.9   Liver Function Tests: Recent Labs  Lab 09/14/20 1240 09/15/20 0441  AST 105* 61*  ALT 78* 61*  ALKPHOS 796* 690*  BILITOT 1.6* 1.6*  PROT 6.9 5.9*  ALBUMIN 2.5* 2.4*   Recent Labs  Lab 09/14/20 1240  LIPASE 36   No results for input(s): AMMONIA in the last 168 hours. Cardiac Enzymes: No results for input(s): CKTOTAL, CKMB, CKMBINDEX, TROPONINI in the last 168 hours. BNP (last 3 results) Recent Labs    07/24/20 0748  BNP 206.5*    ProBNP (last 3 results) No results for input(s): PROBNP in the last 8760 hours.  CBG: No results for input(s): GLUCAP in the last 168 hours. Recent  Results (from the past 240 hour(s))  Culture, blood (routine x 2)     Status: None (Preliminary result)   Collection Time: 09/14/20 12:00 PM   Specimen: BLOOD RIGHT ARM  Result Value Ref Range Status   Specimen Description   Final    BLOOD RIGHT ARM Performed at [facility], [address] [place], [place] [address]    Special Requests   Final    BOTTLES DRAWN AEROBIC AND ANAEROBIC Blood Culture adequate volume Performed at [facility], [address] [place], [place] [address]    Culture   Final    NO GROWTH < 12 HOURS Performed at [facility], [address], [place], [place] [address]    Report Status PENDING  Incomplete  C Difficile Quick Screen w PCR reflex     Status: None   Collection Time: 09/14/20  2:20 PM   Specimen: STOOL  Result Value Ref Range Status   C Diff antigen NEGATIVE NEGATIVE Final   C Diff toxin NEGATIVE NEGATIVE Final   C Diff interpretation No C. difficile detected.  Final    Comment: Performed at [facility], [address], [place], [place] [address]  Respiratory Panel by RT PCR (Flu A&B, Covid) - Nasopharyngeal Swab     Status: None   Collection Time: 09/14/20  4:04 PM   Specimen: Nasopharyngeal Swab   Result Value Ref Range Status   SARS Coronavirus 2 by RT PCR NEGATIVE NEGATIVE Final    Comment: (NOTE) SARS-CoV-2 target nucleic acids are NOT DETECTED.  The SARS-CoV-2 RNA is generally detectable in upper respiratoy specimens during the acute phase of infection. The lowest concentration of SARS-CoV-2 viral copies this assay can detect is 131 copies/mL. A negative result does not preclude SARS-Cov-2 infection and should not be used as the sole basis for treatment or other patient management decisions. A negative result may occur with  improper specimen collection/handling, submission of specimen other than nasopharyngeal swab, presence of viral mutation(s) within the areas targeted by this assay, and inadequate number of viral copies (<131 copies/mL). A negative result must be combined with clinical observations, patient history, and epidemiological information. The expected result is Negative.  Fact Sheet for Patients:  https://www.moore.com/  Fact Sheet for Healthcare Providers:  https://www.young.biz/  This test is no t yet approved or cleared by the [place] FDA and  has been authorized for detection and/or diagnosis of SARS-CoV-2 by FDA under an Emergency Use Authorization (EUA). This EUA will remain  in effect (meaning this test can be used) for the duration of the COVID-19 declaration under Section 564(b)(1) of the Act, 21 U.S.C. section 360bbb-3(b)(1), unless the authorization is terminated or revoked sooner.     Influenza A by PCR NEGATIVE NEGATIVE Final   Influenza B by PCR NEGATIVE NEGATIVE Final    Comment: (NOTE) The Xpert Xpress SARS-CoV-2/FLU/RSV assay is intended as an aid in  the diagnosis of influenza from Nasopharyngeal swab specimens and  should not be used as a sole basis for treatment. Nasal washings and  aspirates are unacceptable for Xpert Xpress SARS-CoV-2/FLU/RSV  testing.  Fact Sheet for  Patients: https://www.moore.com/  Fact Sheet for Healthcare Providers: https://www.young.biz/  This test is not yet approved or cleared by the [place] FDA and  has been authorized for detection and/or diagnosis of SARS-CoV-2 by  FDA under an Emergency Use Authorization (EUA). This EUA will remain  in effect (meaning this test can be  used) for the duration of the  Covid-19 declaration under Section 564(b)(1) of the Act, 21  U.S.C. section 360bbb-3(b)(1), unless the authorization is  terminated or revoked. Performed at [facility], [address] [place], [place] [address]      Studies: CT Abdomen Pelvis W Contrast  Result Date: 09/14/2020 CLINICAL DATA:  Diarrhea after surgery, fatigue, weakness, history atrial fibrillation, coronary artery disease, hypertension, post CABG and Maze procedure on 07/29/2020 EXAM: CT ABDOMEN AND PELVIS WITH CONTRAST TECHNIQUE: Multidetector CT imaging of the abdomen and pelvis was performed using the standard protocol following bolus administration of intravenous contrast. Sagittal and coronal MPR images reconstructed from axial data set. CONTRAST:  OMNIPAQUE IOHEXOL 300 MG/ML SOLN IV. No oral contrast. COMPARISON:  07/16/2017 FINDINGS: Lower chest: Small to moderate LEFT pleural effusion with partial atelectasis of LEFT lower lobe. Minimal RIGHT pleural effusion and basilar atelectasis dependently. Calcified granuloma RIGHT lower lobe. Hepatobiliary: Distended gallbladder with wall thickening and pericholecystic infiltrative changes concerning for acute cholecystitis. No definite calcified gallstones. Dilated CBD up to 19 mm diameter to ampulla. No definite calcification at duodenal junction though questionable wall thickening at the ampulla is present. Intrahepatic biliary dilatation present. No focal hepatic mass lesion. Pancreas: No focal abnormalities Spleen: Normal appearance Adrenals/Urinary  Tract: 5 mm calculus at central LEFT kidney. Cortical scarring LEFT kidney. Adrenal glands, kidneys, and ureters otherwise normal appearance. Bladder contains small amount of urine and extends into a RIGHT inguinal hernia. Stomach/Bowel: Minimal colonic diverticulosis without evidence of diverticulitis. Normal appendix. Stomach decompressed containing radiodense material question medication. Remaining bowel loops unremarkable. Vascular/Lymphatic: Atherosclerotic calcifications aorta and iliac arteries without aneurysm. No adenopathy Reproductive: Unremarkable prostate gland and seminal vesicles. Other: RIGHT inguinal hernia containing fat and portion of the urinary bladder. No bowel herniation. No additional hernia is identified. No free air or free fluid. Musculoskeletal: Osseous demineralization. IMPRESSION: Distended gallbladder with wall thickening and pericholecystic infiltrative changes concerning for acute cholecystitis. Dilated CBD up to 19 mm diameter with intrahepatic biliary dilatation, recommend correlation with LFTs; cause of biliary dilatation is uncertain, without calcification evident, could be due to noncalcified stone, mass, or stricture. This could be assessed by MRCP imaging for further evaluation. RIGHT inguinal hernia containing fat and a portion of the urinary bladder. 5 minutes mm nonobstructing LEFT renal calculus. Small to moderate LEFT and minimal RIGHT pleural effusions with bibasilar atelectasis greater on LEFT. Minimal colonic diverticulosis without evidence of diverticulitis. Aortic Atherosclerosis (ICD10-I70.0). Electronically Signed   By: Ulyses Southward M.D.   On: 09/14/2020 15:17   MR 3D Recon At Scanner  Result Date: 09/16/2020 CLINICAL DATA:  Elevated liver function studies and biliary dilatation on CT scan. EXAM: MRI ABDOMEN WITHOUT AND WITH CONTRAST (INCLUDING MRCP) TECHNIQUE: Multiplanar multisequence MR imaging of the abdomen was performed both before and after the  administration of intravenous contrast. Heavily T2-weighted images of the biliary and pancreatic ducts were obtained, and three-dimensional MRCP images were rendered by post processing. CONTRAST:  32mL GADAVIST GADOBUTROL 1 MMOL/ML IV SOLN COMPARISON:  CT scan 09/14/2020 and ultrasound 09/04/2020 FINDINGS: Examination limited due to breathing motion artifact. Lower chest: There is a left pleural effusion and left lower lobe infiltrate or atelectasis. No pericardial effusion. Hepatobiliary: Moderate intrahepatic biliary dilatation and significant common bile duct dilatation measuring up to 19 mm. The gallbladder demonstrates distension and there is layering sludge in the gallbladder and possible tiny stones. Similar findings are demonstrated in the common bile duct with layering sludge. No large obstructing  common bile duct stone is identified. No hepatic lesions. The portal and hepatic veins are patent. Marked wall thickening suggesting acute cholecystitis. Pancreas:  No mass, inflammation or ductal dilatation. Spleen:  Normal size. No focal lesions. Adrenals/Urinary Tract: The adrenal glands and kidneys are unremarkable. No renal lesions are hydronephrosis. Stomach/Bowel: The stomach, duodenum, visualized small bowel and visualized colon are grossly normal. Vascular/Lymphatic: Age advanced atherosclerotic calcifications involving the aorta but no aneurysm or dissection. The branch vessels are patent. No mesenteric or retroperitoneal mass or adenopathy. Other: No ascites or abdominal wall hernia. Small scattered upper abdominal lymph nodes. Musculoskeletal: No significant bony findings. IMPRESSION: 1. Moderate intrahepatic biliary dilatation and significant common bile duct dilatation measuring up to 19 mm. No large obstructing common bile duct stone is identified. There is layering sludge in the common bile duct. No ampullary mass or pancreatic head mass. 2. Distended gallbladder with marked gallbladder wall  thickening suggesting acute cholecystitis. Layering sludge and possible tiny stones. 3. Left pleural effusion and left lower lobe infiltrate or atelectasis. Electronically Signed   By: Rudie Meyer M.D.   On: 09/16/2020 05:10   DG Chest Port 1 View  Result Date: 09/14/2020 CLINICAL DATA:  Acute cholecystitis, diarrhea for 2 months, abdominal distension EXAM: PORTABLE CHEST 1 VIEW COMPARISON:  09/01/2020 FINDINGS: Single frontal view of the chest demonstrates a stable cardiac silhouette. Chronic left basilar consolidation and effusion, stable. Right chest is clear. No pneumothorax. Chronic postsurgical changes from median sternotomy. IMPRESSION: 1. Persistent left basilar consolidation and effusion. Electronically Signed   By: Sharlet Salina M.D.   On: 09/14/2020 16:41   MR ABDOMEN MRCP W WO CONTAST  Result Date: 09/16/2020 CLINICAL DATA:  Elevated liver function studies and biliary dilatation on CT scan. EXAM: MRI ABDOMEN WITHOUT AND WITH CONTRAST (INCLUDING MRCP) TECHNIQUE: Multiplanar multisequence MR imaging of the abdomen was performed both before and after the administration of intravenous contrast. Heavily T2-weighted images of the biliary and pancreatic ducts were obtained, and three-dimensional MRCP images were rendered by post processing. CONTRAST:  10mL GADAVIST GADOBUTROL 1 MMOL/ML IV SOLN COMPARISON:  CT scan 09/14/2020 and ultrasound 09/04/2020 FINDINGS: Examination limited due to breathing motion artifact. Lower chest: There is a left pleural effusion and left lower lobe infiltrate or atelectasis. No pericardial effusion. Hepatobiliary: Moderate intrahepatic biliary dilatation and significant common bile duct dilatation measuring up to 19 mm. The gallbladder demonstrates distension and there is layering sludge in the gallbladder and possible tiny stones. Similar findings are demonstrated in the common bile duct with layering sludge. No large obstructing common bile duct stone is identified. No  hepatic lesions. The portal and hepatic veins are patent. Marked wall thickening suggesting acute cholecystitis. Pancreas:  No mass, inflammation or ductal dilatation. Spleen:  Normal size. No focal lesions. Adrenals/Urinary Tract: The adrenal glands and kidneys are unremarkable. No renal lesions are hydronephrosis. Stomach/Bowel: The stomach, duodenum, visualized small bowel and visualized colon are grossly normal. Vascular/Lymphatic: Age advanced atherosclerotic calcifications involving the aorta but no aneurysm or dissection. The branch vessels are patent. No mesenteric or retroperitoneal mass or adenopathy. Other: No ascites or abdominal wall hernia. Small scattered upper abdominal lymph nodes. Musculoskeletal: No significant bony findings. IMPRESSION: 1. Moderate intrahepatic biliary dilatation and significant common bile duct dilatation measuring up to 19 mm. No large obstructing common bile duct stone is identified. There is layering sludge in the common bile duct. No ampullary mass or pancreatic head mass. 2. Distended gallbladder with marked gallbladder wall thickening suggesting acute cholecystitis. Layering  sludge and possible tiny stones. 3. Left pleural effusion and left lower lobe infiltrate or atelectasis. Electronically Signed   By: Rudie MeyerP.  Gallerani M.D.   On: 09/16/2020 05:10      Joycelyn DasLaxman Jd Mccaster, MD  [facility] 09/16/2020

## 2020-09-16 NOTE — Progress Notes (Addendum)
Progress Note   Subjective  Chief Complaint: Elevated LFTs  Today, the patient tells me that he is doing okay, he is really not had any loose stools over the past 48 hours but has not eaten anything since Saturday per his report.  Explains that the surgeon came in and told him that he probably could have something to eat as long as he was not having a procedure.  Also explained that he has no abdominal pain.  He is just waiting for [place] to proceed.    Objective   Vital signs in last 24 hours: Temp:  [97.6 F (36.4 C)-98 F (36.7 C)] 98 F (36.7 C) (11/09 0706) Pulse Rate:  [71-80] 71 (11/09 0706) Resp:  [18-20] 20 (11/09 0706) BP: (117-147)/(65-72) 147/70 (11/09 0706) SpO2:  [98 %-100 %] 98 % (11/09 0706) Weight:  [105 kg] 105 kg (11/09 0706) Last BM Date: 09/15/20 General:    white male in NAD Heart:  Regular rate and rhythm; no murmurs Lungs: Respirations even and unlabored, lungs CTA bilaterally Abdomen:  Soft, nontender and nondistended. Normal bowel sounds. Extremities:  Without edema. Neurologic:  Alert and oriented,  grossly normal neurologically. Psych:  Cooperative. Normal mood and affect.  Intake/Output from previous day: 11/08 0701 - 11/09 0700 In: 1164.5 [I.V.:964.4; IV Piggyback:200] Out: -  Intake/Output this shift: Total I/O In: -  Out: 600 [Urine:600]  Lab Results: Recent Labs    09/14/20 1240 09/15/20 0441  WBC 13.4* 10.4  HGB 13.1 11.4*  HCT 42.5 38.6*  PLT 486* 376   BMET Recent Labs    09/14/20 1240 09/15/20 0441 09/16/20 0506  NA 136 137 140  K 3.1* 3.4* 3.4*  CL 97* 103 106  CO2 23 22 22   GLUCOSE 103* 79 67*  BUN 15 16 11   CREATININE 1.36* 1.02 1.11  CALCIUM 8.3* 7.8* 8.3*   LFT Recent Labs    09/15/20 0441 09/15/20 0441 09/16/20 0506  PROT 5.9*   < > 6.1*  ALBUMIN 2.4*   < > 2.3*  AST 61*   < > 41  ALT 61*   < > 51*  ALKPHOS 690*   < > 592*  BILITOT 1.6*   < > 1.5*  BILIDIR 0.4*  --   --    < > = values in this  interval not displayed.   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 Sagittal and coronal MPR images reconstructed from axial data set. CONTRAST:  13/05/2020 OMNIPAQUE IOHEXOL 300 MG/ML SOLN IV. No oral contrast. COMPARISON:  07/16/2017 FINDINGS: Lower chest: Small to moderate LEFT pleural effusion with partial atelectasis of LEFT lower lobe. Minimal RIGHT pleural effusion and basilar atelectasis dependently. Calcified granuloma RIGHT lower lobe. Hepatobiliary: Distended gallbladder with wall thickening and pericholecystic infiltrative changes concerning for acute cholecystitis. No definite calcified gallstones. Dilated CBD up to 19 mm diameter to ampulla. No definite calcification at duodenal junction though questionable wall thickening at the ampulla is present. Intrahepatic biliary dilatation present. No focal hepatic mass lesion. Pancreas: No focal abnormalities Spleen: Normal appearance Adrenals/Urinary Tract: 5 mm calculus  at central LEFT kidney. Cortical scarring LEFT kidney. Adrenal glands, kidneys, and ureters otherwise normal appearance. Bladder contains small amount of urine and extends into a RIGHT inguinal hernia. Stomach/Bowel: Minimal colonic diverticulosis without evidence of diverticulitis. Normal appendix. Stomach decompressed containing radiodense material question medication. Remaining bowel loops unremarkable. Vascular/Lymphatic: Atherosclerotic calcifications aorta and iliac arteries without aneurysm. No adenopathy Reproductive:  Unremarkable prostate gland and seminal vesicles. Other: RIGHT inguinal hernia containing fat and portion of the urinary bladder. No bowel herniation. No additional hernia is identified. No free air or free fluid. Musculoskeletal: Osseous demineralization. IMPRESSION: Distended gallbladder with wall thickening and pericholecystic infiltrative changes concerning for acute cholecystitis. Dilated CBD up to 19 mm diameter with intrahepatic biliary dilatation, recommend correlation with LFTs; cause of biliary dilatation is uncertain, without calcification evident, could be due to noncalcified stone, mass, or stricture. This could be assessed by MRCP imaging for further evaluation. RIGHT inguinal hernia containing fat and a portion of the urinary bladder. 5 minutes mm nonobstructing LEFT renal calculus. Small to moderate LEFT and minimal RIGHT pleural effusions with bibasilar atelectasis greater on LEFT. Minimal colonic diverticulosis without evidence of diverticulitis. Aortic Atherosclerosis (ICD10-I70.0). Electronically Signed   By: Ulyses Southward M.D.   On: 09/14/2020 15:17   MR 3D Recon At Scanner  Result Date: 09/16/2020 CLINICAL DATA:  Elevated liver function studies and biliary dilatation on CT scan. EXAM: MRI ABDOMEN WITHOUT AND WITH CONTRAST (INCLUDING MRCP) TECHNIQUE: Multiplanar multisequence MR imaging of the abdomen was performed both before and after the administration of intravenous contrast. Heavily T2-weighted images of the biliary and pancreatic ducts were obtained, and three-dimensional MRCP images were rendered by post processing. CONTRAST:  1mL GADAVIST GADOBUTROL 1 MMOL/ML IV SOLN COMPARISON:  CT scan 09/14/2020 and ultrasound 09/04/2020 FINDINGS: Examination limited due to breathing motion artifact. Lower chest: There is a left pleural effusion and left lower lobe infiltrate or atelectasis. No pericardial effusion. Hepatobiliary: Moderate intrahepatic biliary dilatation and significant common bile  duct dilatation measuring up to 19 mm. The gallbladder demonstrates distension and there is layering sludge in the gallbladder and possible tiny stones. Similar findings are demonstrated in the common bile duct with layering sludge. No large obstructing common bile duct stone is identified. No hepatic lesions. The portal and hepatic veins are patent. Marked wall thickening suggesting acute cholecystitis. Pancreas:  No mass, inflammation or ductal dilatation. Spleen:  Normal size. No focal lesions. Adrenals/Urinary Tract: The adrenal glands and kidneys are unremarkable. No renal lesions are hydronephrosis. Stomach/Bowel: The stomach, duodenum, visualized small bowel and visualized colon are grossly normal. Vascular/Lymphatic: Age advanced atherosclerotic calcifications involving the aorta but no aneurysm or dissection. The branch vessels are patent. No mesenteric or retroperitoneal mass or adenopathy. Other: No ascites or abdominal wall hernia. Small scattered upper abdominal lymph nodes. Musculoskeletal: No significant bony findings. IMPRESSION: 1. Moderate intrahepatic biliary dilatation and significant common bile duct dilatation measuring up to 19 mm. No large obstructing common bile duct stone is identified. There is layering sludge in the common bile duct. No ampullary mass or pancreatic head mass. 2. Distended gallbladder with marked gallbladder wall thickening suggesting acute cholecystitis. Layering sludge and possible tiny stones. 3. Left pleural effusion and left lower lobe infiltrate or atelectasis. Electronically Signed   By: Rudie Meyer M.D.   On: 09/16/2020 05:10   DG Chest Port 1 View  Result Date: 09/14/2020 CLINICAL DATA:  Acute cholecystitis, diarrhea for 2 months, abdominal distension EXAM: PORTABLE CHEST 1 VIEW COMPARISON:  09/01/2020 FINDINGS: Single frontal  view of the chest demonstrates a stable cardiac silhouette. Chronic left basilar consolidation and effusion, stable. Right chest is  clear. No pneumothorax. Chronic postsurgical changes from median sternotomy. IMPRESSION: 1. Persistent left basilar consolidation and effusion. Electronically Signed   By: Sharlet Salina M.D.   On: 09/14/2020 16:41   MR ABDOMEN MRCP W WO CONTAST  Result Date: 09/16/2020 CLINICAL DATA:  Elevated liver function studies and biliary dilatation on CT scan. EXAM: MRI ABDOMEN WITHOUT AND WITH CONTRAST (INCLUDING MRCP) TECHNIQUE: Multiplanar multisequence MR imaging of the abdomen was performed both before and after the administration of intravenous contrast. Heavily T2-weighted images of the biliary and pancreatic ducts were obtained, and three-dimensional MRCP images were rendered by post processing. CONTRAST:  90mL GADAVIST GADOBUTROL 1 MMOL/ML IV SOLN COMPARISON:  CT scan 09/14/2020 and ultrasound 09/04/2020 FINDINGS: Examination limited due to breathing motion artifact. Lower chest: There is a left pleural effusion and left lower lobe infiltrate or atelectasis. No pericardial effusion. Hepatobiliary: Moderate intrahepatic biliary dilatation and significant common bile duct dilatation measuring up to 19 mm. The gallbladder demonstrates distension and there is layering sludge in the gallbladder and possible tiny stones. Similar findings are demonstrated in the common bile duct with layering sludge. No large obstructing common bile duct stone is identified. No hepatic lesions. The portal and hepatic veins are patent. Marked wall thickening suggesting acute cholecystitis. Pancreas:  No mass, inflammation or ductal dilatation. Spleen:  Normal size. No focal lesions. Adrenals/Urinary Tract: The adrenal glands and kidneys are unremarkable. No renal lesions are hydronephrosis. Stomach/Bowel: The stomach, duodenum, visualized small bowel and visualized colon are grossly normal. Vascular/Lymphatic: Age advanced atherosclerotic calcifications involving the aorta but no aneurysm or dissection. The branch vessels are patent. No  mesenteric or retroperitoneal mass or adenopathy. Other: No ascites or abdominal wall hernia. Small scattered upper abdominal lymph nodes. Musculoskeletal: No significant bony findings. IMPRESSION: 1. Moderate intrahepatic biliary dilatation and significant common bile duct dilatation measuring up to 19 mm. No large obstructing common bile duct stone is identified. There is layering sludge in the common bile duct. No ampullary mass or pancreatic head mass. 2. Distended gallbladder with marked gallbladder wall thickening suggesting acute cholecystitis. Layering sludge and possible tiny stones. 3. Left pleural effusion and left lower lobe infiltrate or atelectasis. Electronically Signed   By: Rudie Meyer M.D.   On: 09/16/2020 05:10       Assessment / Plan:   Assessment: 1.  Sepsis due to acute cholecystitis: Acute transaminitis, CT abdomen pelvis revealing evidence of acute cholecystitis as well as dilated common bile duct, MRCP with signs of sludge and continued dilation 2.  Elevated LFTs: With above 3.  Hypokalemia: Improving 4.  AKI 5.  CAD: Xarelto not given since 09/13/2020  Plan: 1.  Patient is scheduled for an ERCP tomorrow afternoon with Dr. [name].  Did discuss risks, benefits, limitations and alternatives and patient agrees to proceed. 2.  Patient can have a regular diet today and n.p.o. after midnight 3.  Recheck CBC and CMP tomorrow 4.  Continue other supportive measures 5. Ordered 5mg  IV K for elevated INR, will recheck tomorrow morning 6.  Please await any further recommendations from Dr. later today.  Thank you for your kind consultation, we will continue to follow.   LOS: 2 days   Orvan Falconer  09/16/2020, 12:49 PM

## 2020-09-16 NOTE — Plan of Care (Signed)
  Problem: Clinical Measurements: Goal: Will remain free from infection Outcome: Progressing Goal: Diagnostic test results will improve Outcome: Progressing   Problem: Activity: Goal: Risk for activity intolerance will decrease Outcome: Progressing   Problem: Nutrition: Goal: Adequate nutrition will be maintained Outcome: Progressing   Problem: Elimination: Goal: Will not experience complications related to bowel motility Outcome: Progressing Goal: Will not experience complications related to urinary retention Outcome: Progressing   Problem: Safety: Goal: Ability to remain free from injury will improve Outcome: Progressing   Problem: Skin Integrity: Goal: Risk for impaired skin integrity will decrease Outcome: Progressing   Problem: Education: Goal: Knowledge of General Education information will improve Description: Including pain rating scale, medication(s)/side effects and non-pharmacologic comfort measures Outcome: Progressing   Problem: Health Behavior/Discharge Planning: Goal: Ability to manage health-related needs will improve Outcome: Progressing   Problem: Clinical Measurements: Goal: Ability to maintain clinical measurements within normal limits will improve Outcome: Progressing Goal: Will remain free from infection Outcome: Progressing Goal: Diagnostic test results will improve Outcome: Progressing Goal: Respiratory complications will improve Outcome: Progressing Goal: Cardiovascular complication will be avoided Outcome: Progressing   Problem: Activity: Goal: Risk for activity intolerance will decrease Outcome: Progressing   Problem: Nutrition: Goal: Adequate nutrition will be maintained Outcome: Progressing   Problem: Coping: Goal: Level of anxiety will decrease Outcome: Progressing   Problem: Elimination: Goal: Will not experience complications related to bowel motility Outcome: Progressing Goal: Will not experience complications related to  urinary retention Outcome: Progressing   Problem: Pain Managment: Goal: General experience of comfort will improve Outcome: Progressing   Problem: Safety: Goal: Ability to remain free from injury will improve Outcome: Progressing   Problem: Skin Integrity: Goal: Risk for impaired skin integrity will decrease Outcome: Progressing

## 2020-09-16 NOTE — Consult Note (Signed)
CARDIOLOGY CONSULT NOTE  Patient ID: [name] Rice MRN: [id number] DOB/AGE: 67/01/1951 67 y.o.  Admit date: 09/14/2020 Attending physician: Joycelyn Das, MD Primary Physician:  [name], PA-C Outpatient Cardiologist: Tessa Lerner, DO, [facility] Inpatient Cardiologist: Tessa Lerner, DO, [facility]  Chief complaint: Preoperative risk stratification  HPI:  [name] Rice is a 69 y.o. 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  Patient has been experiencing low blood pressure and increased diarrheal movements and as result came to the hospital for further evaluation and management.  He was tested negative.  C. difficile but LFTs were elevated and underwent an ultrasound of the abdomen.  Patient underwent further imaging modalities and was evaluated by general surgery and gastroenterology and currently being treated for acute cholecystitis.  Cardiology was consulted today for recommendations in regards to his preoperative risk stratification prior to undergoing ERCP and laparoscopic cholecystectomy.  Currently patient denies any chest pain to suggest acute coronary syndrome.  He is overall euvolemic and not in congestive heart failure.  No history of diabetes mellitus, no history of prior stroke.  Serum creatinine level is less than 2 mg/dL.  He recently underwent four-vessel coronary artery bypass grafting surgery in September 2021.  He also carries a history of persistent atrial fibrillation and left carotid artery stenosis.  Patient is resting in bed  comfortably in no acute distress and hemodynamically stable.  Per patient, his last dose of Xarelto was on 09/14/2020.  ALLERGIES: No Known Allergies  PAST MEDICAL HISTORY: Past Medical History:  Diagnosis Date  . Atrial fibrillation (HCC)   . Coronary artery disease   . Dysrhythmia   . Hypertension   . LBBB (left bundle branch block)     PAST SURGICAL HISTORY: Past Surgical History:  Procedure Laterality Date  . CARDIOVERSION N/A 09/28/2016   Procedure: CARDIOVERSION;  Surgeon: Yates Decamp, MD;  Location: [facility] ENDOSCOPY;  Service: Cardiovascular;  Laterality: N/A;  . CLIPPING OF ATRIAL APPENDAGE Left 07/29/2020   Procedure: CLIPPING OF ATRIAL APPENDAGE;  Surgeon: Linden Dolin, MD;  Location: [facility] OR;  Service: Open Heart Surgery;  Laterality: Left;  . CORONARY ARTERY BYPASS GRAFT N/A 07/29/2020   Procedure: CORONARY ARTERY BYPASS GRAFTING (CABG) TIMES FOUR USING BILATERAL INTERNAL MAMMARIES AND LEFT RADIAL ARTERY;  Surgeon: Linden Dolin, MD;  Location: [facility] OR;  Service: Open Heart Surgery;  Laterality: N/A;  . LEFT HEART CATH AND CORONARY ANGIOGRAPHY N/A 07/25/2020   Procedure: LEFT HEART CATH AND CORONARY ANGIOGRAPHY;  Surgeon: Yates Decamp, MD;  Location: [facility] INVASIVE CV LAB;  Service: Cardiovascular;  Laterality: N/A;  . MAZE N/A 07/29/2020   Procedure: MAZE;  Surgeon: Linden Dolin, MD;  Location: [facility] OR;  Service: Open Heart Surgery;  Laterality: N/A;  . NO PAST SURGERIES    . RADIAL ARTERY HARVEST Left 07/29/2020   Procedure: RADIAL ARTERY HARVEST;  Surgeon: Linden Dolin, MD;  Location: [facility] OR;  Service: Open Heart Surgery;  Laterality: Left;  . TEE WITHOUT CARDIOVERSION N/A 07/29/2020   Procedure: TRANSESOPHAGEAL ECHOCARDIOGRAM (TEE);  Surgeon: Linden Dolin, MD;  Location[facility] OR;  Service: Open Heart Surgery;  Laterality: N/A;    FAMILY HISTORY: The patient  family history includes Hypertension in an other family member.   SOCIAL HISTORY:  The patient  reports  that he has never smoked. He has never used smokeless tobacco. He reports that he does not drink alcohol and does not use drugs.  MEDICATIONS: Current Outpatient Medications  Medication Instructions  . acetaminophen (TYLENOL) 1,000 mg, Oral, Every 6 hours PRN  . amLODipine (NORVASC) 5 mg, Oral, Daily at bedtime  . Ensure (ENSURE) 1 Can, Oral, See admin instructions, Drink 1 can of VANILLA ENSURE by mouth once a day  . loperamide (IMODIUM A-D) 2 mg, Oral, 4 times daily PRN  . metoprolol tartrate (LOPRESSOR) 12.5 mg, Oral, Daily  . olmesartan (BENICAR) 40 mg, Oral, Every morning  . potassium chloride SA (KLOR-CON) 20 MEQ tablet 20 mEq, Oral, 2 times daily  . rosuvastatin (CRESTOR) 40 mg, Oral, Daily  . XARELTO 20 MG TABS tablet TAKE 1 TABLET BY MOUTH IN  THE EVENING AFTER DINNER    14 ORGAN REVIEW OF SYSTEMS: Review of Systems  Constitutional: Positive for malaise/fatigue. Negative for chills and fever.  HENT: Negative for hoarse voice and nosebleeds.   Eyes: Negative for discharge, double vision and pain.  Cardiovascular: Negative for chest pain, claudication, dyspnea on exertion, leg swelling, near-syncope, orthopnea, palpitations, paroxysmal nocturnal dyspnea and syncope.  Respiratory: Negative for hemoptysis and shortness of breath.   Musculoskeletal: Negative for muscle cramps and myalgias.  Gastrointestinal: Positive for diarrhea. Negative for abdominal pain, constipation, hematemesis, hematochezia, melena, nausea and vomiting.  Neurological: Negative for dizziness and light-headedness.  All other systems reviewed and are negative.  PHYSICAL EXAM: Vitals with BMI 09/16/2020 09/16/2020 09/15/2020  Height - - -  Weight - 231 lbs 8 oz -  BMI - 29.71 -  Systolic 135 147 [id number]  Diastolic 65 70 65  Pulse 79 71 78    Intake/Output Summary (Last 24 hours) at 09/16/2020 1809 Last data filed at 09/16/2020 1359 Gross per 24 hour  Intake 560.9 ml  Output 725 ml  Net -164.1 ml    Net IO  Since Admission: 1,164.6 mL [09/16/20 1809]  CONSTITUTIONAL:Appears older than stated age, hemodynamically stable, no acute distress. SKIN: Skin is warm and dry. No rash noted. No cyanosis. No pallor.  HEAD: Normocephalic and atraumatic.  EYES: No scleral icterus MOUTH/THROAT: Moist oral membranes.  NECK: No JVD present. No thyromegaly noted. No carotid bruits  LYMPHATIC: No visible cervical adenopathy.  CHEST Normal respiratory effort. No intercostal retractions.  Sternotomy site is healing well. LUNGS:Clear to auscultation in the upper lung fields with decreased breath sounds at the bases,No stridor. No wheezes. No rales.  CARDIOVASCULAR:Irregularly irregular, positive S1-S2, no murmurs rubs or gallops appreciated. ABDOMINAL: Soft, nontender, nondistended, positive bowel sounds in all 4 quadrants, no apparent ascites.  EXTREMITIES: No peripheral edema.  Left radial site is healing well. HEMATOLOGIC: No significant bruising NEUROLOGIC: Oriented to person, place, and time. Nonfocal. Normal muscle tone.  PSYCHIATRIC: Normal mood and affect. Normal behavior. Cooperative.  RADIOLOGY: MR 3D Recon At Scanner  Result Date: 09/16/2020 CLINICAL DATA:  Elevated liver function studies and biliary dilatation on CT scan. EXAM: MRI ABDOMEN WITHOUT AND WITH CONTRAST (INCLUDING MRCP) TECHNIQUE: Multiplanar multisequence MR imaging of the abdomen was performed both before and after the administration of intravenous contrast. Heavily T2-weighted images of the biliary and pancreatic ducts were obtained, and three-dimensional MRCP images were rendered by post processing. CONTRAST:  10mL GADAVIST GADOBUTROL 1 MMOL/ML IV SOLN COMPARISON:  CT scan 09/14/2020 and ultrasound 09/04/2020 FINDINGS: Examination  limited due to breathing motion artifact. Lower chest: There is a left pleural effusion and left lower lobe infiltrate or atelectasis. No pericardial effusion. Hepatobiliary: Moderate intrahepatic biliary  dilatation and significant common bile duct dilatation measuring up to 19 mm. The gallbladder demonstrates distension and there is layering sludge in the gallbladder and possible tiny stones. Similar findings are demonstrated in the common bile duct with layering sludge. No large obstructing common bile duct stone is identified. No hepatic lesions. The portal and hepatic veins are patent. Marked wall thickening suggesting acute cholecystitis. Pancreas:  No mass, inflammation or ductal dilatation. Spleen:  Normal size. No focal lesions. Adrenals/Urinary Tract: The adrenal glands and kidneys are unremarkable. No renal lesions are hydronephrosis. Stomach/Bowel: The stomach, duodenum, visualized small bowel and visualized colon are grossly normal. Vascular/Lymphatic: Age advanced atherosclerotic calcifications involving the aorta but no aneurysm or dissection. The branch vessels are patent. No mesenteric or retroperitoneal mass or adenopathy. Other: No ascites or abdominal wall hernia. Small scattered upper abdominal lymph nodes. Musculoskeletal: No significant bony findings. IMPRESSION: 1. Moderate intrahepatic biliary dilatation and significant common bile duct dilatation measuring up to 19 mm. No large obstructing common bile duct stone is identified. There is layering sludge in the common bile duct. No ampullary mass or pancreatic head mass. 2. Distended gallbladder with marked gallbladder wall thickening suggesting acute cholecystitis. Layering sludge and possible tiny stones. 3. Left pleural effusion and left lower lobe infiltrate or atelectasis. Electronically Signed   By: Rudie Meyer M.D.   On: 09/16/2020 05:10   MR ABDOMEN MRCP W WO CONTAST  Result Date: 09/16/2020 CLINICAL DATA:  Elevated liver function studies and biliary dilatation on CT scan. EXAM: MRI ABDOMEN WITHOUT AND WITH CONTRAST (INCLUDING MRCP) TECHNIQUE: Multiplanar multisequence MR imaging of the abdomen was performed both before and after  the administration of intravenous contrast. Heavily T2-weighted images of the biliary and pancreatic ducts were obtained, and three-dimensional MRCP images were rendered by post processing. CONTRAST:  70mL GADAVIST GADOBUTROL 1 MMOL/ML IV SOLN COMPARISON:  CT scan 09/14/2020 and ultrasound 09/04/2020 FINDINGS: Examination limited due to breathing motion artifact. Lower chest: There is a left pleural effusion and left lower lobe infiltrate or atelectasis. No pericardial effusion. Hepatobiliary: Moderate intrahepatic biliary dilatation and significant common bile duct dilatation measuring up to 19 mm. The gallbladder demonstrates distension and there is layering sludge in the gallbladder and possible tiny stones. Similar findings are demonstrated in the common bile duct with layering sludge. No large obstructing common bile duct stone is identified. No hepatic lesions. The portal and hepatic veins are patent. Marked wall thickening suggesting acute cholecystitis. Pancreas:  No mass, inflammation or ductal dilatation. Spleen:  Normal size. No focal lesions. Adrenals/Urinary Tract: The adrenal glands and kidneys are unremarkable. No renal lesions are hydronephrosis. Stomach/Bowel: The stomach, duodenum, visualized small bowel and visualized colon are grossly normal. Vascular/Lymphatic: Age advanced atherosclerotic calcifications involving the aorta but no aneurysm or dissection. The branch vessels are patent. No mesenteric or retroperitoneal mass or adenopathy. Other: No ascites or abdominal wall hernia. Small scattered upper abdominal lymph nodes. Musculoskeletal: No significant bony findings. IMPRESSION: 1. Moderate intrahepatic biliary dilatation and significant common bile duct dilatation measuring up to 19 mm. No large obstructing common bile duct stone is identified. There is layering sludge in the common bile duct. No ampullary mass or pancreatic head mass. 2. Distended gallbladder with marked gallbladder wall  thickening suggesting acute cholecystitis. Layering sludge and possible tiny stones. 3. Left pleural effusion and left  lower lobe infiltrate or atelectasis. Electronically Signed   By: Rudie Meyer M.D.   On: 09/16/2020 05:10    LABORATORY DATA: Lab Results  Component Value Date   WBC 10.4 09/15/2020   HGB 11.4 (L) 09/15/2020   HCT 38.6 (L) 09/15/2020   MCV 95.8 09/15/2020   PLT 376 09/15/2020    Recent Labs  Lab 09/16/20 0506  NA 140  K 3.4*  CL 106  CO2 22  BUN 11  CREATININE 1.11  CALCIUM 8.3*  PROT 6.1*  BILITOT 1.5*  ALKPHOS 592*  ALT 51*  AST 41  GLUCOSE 67*    Lipid Panel     Component Value Date/Time   CHOL 78 07/26/2020 0515   CHOL 143 07/10/2019 0906   TRIG 92 07/26/2020 0515   HDL 24 (L) 07/26/2020 0515   HDL 51 07/10/2019 0906   CHOLHDL 3.3 07/26/2020 0515   VLDL 18 07/26/2020 0515   LDLCALC 36 07/26/2020 0515   LDLCALC 74 07/10/2019 0906    BNP (last 3 results) Recent Labs    07/24/20 0748  BNP 206.5*    HEMOGLOBIN A1C Lab Results  Component Value Date   HGBA1C 5.2 07/26/2020   MPG 102.54 07/26/2020    Cardiac Panel (last 3 results) Recent Labs    07/24/20 0210  CKTOTAL 39*    Lab Results  Component Value Date   CKTOTAL 39 (L) 07/24/2020     TSH Recent Labs    07/02/20 1604 07/24/20 0748 09/16/20 0506  TSH 1.898 0.941 1.083     CARDIAC DATABASE: EKG: 09/14/2020: Atrial fibrillation, 89 bpm, left bundle branch block, without underlying injury pattern.  Coronary artery bypass grafting: 07/29/2020 (by Dr. Vickey Sages at [facility]): Left Internal Mammary Artery to Distal Left Anterior Descending Coronary Artery; pedicled RIMAGraft to Posterior Descending Coronary Artery; left radial arteryGraft to Obtuse Marginal Branch of Left Circumflex Coronary Artery and ramus intermedius as a sequenced graft. Bi-atrial Maze procedure and left atrial appendage clipping  Echocardiogram: 12/31/2019: Mildly depressed LV systolic  function with visual EF 45-50%. Left ventricle cavity is normal in size. Left ventricle regional wall motion findings: Mid anteroseptal, Mid inferoseptal, Apical anterior, Apical septal and Apical cap hypokinesis. Mild left ventricular hypertrophy.  Unable to evaluate diastolic function due to atrial fibrillation. Calculated EF 50%. Left atrial cavity is severely dilated. Right atrial cavity is slightly dilated. Right ventricle cavity is normal in size. Low normal right ventricular function. Mild aortic valve leaflet thickening with mild calcification. Mildly restricted aortic valve leaflets. No evidence of aortic valve stenosis. No aortic valve regurgitation. Mild tricuspid regurgitation. Mild pulmonary hypertension. RVSP measures 35 mmHg. IVC is dilated with a respiratory response of >50%. Prior study 08/2016: Moderate concentric hypertrophy of the left ventricle. Normal global wall motion. Visual EF is 55-60%. Indeterminate diastolic filling pattern, indeterminate LAP due to A. Fib. Biatrial enlargement. Mild (Grade I) mitral regurgitation. Mild tricuspid regurgitation. No evidence of pulmonary hypertension.   Stress Testing:  02/18/2020: No previous exam available for comparison. Lexiscan nuclear stress test performed using 1-day protocol. Stress EKG is non-diagnostic, as this is pharmacological stress test. In addition, rest and stress EKG showed atrial fibrillation with slow ventricular response, anterolateral T wave inversion. Stress LVEF 77%. Normal myocardial perfusion. Low risk study.  Heart Catheterization: 07/25/20: RCA: Proximal RCA 80% stenosis, large vessel with mild disease in PL and PDA branches. A secondary PL branch is subtotally occluded. LM: Distal 30-40% stenosis. LAD: LAD diffusely diseased, proximal diffuse 80% followed by  tandem 90% stenosis. Large D1 with moderate diffuse disease and proximal and mid tandem 70 and 80% stenosis. Has secondary branches and is  tortuous. Cx and RI: Co-dominant Cx with Ostial circumflex 99% involving a moderate sized ramus with ostial 80 to 90% and proximal 90% stenosis. LV: Normal LVEDP. No pressure gradient across the aortic valve. Patent LIMA and RIMA and RIMA has ostial 20-30% stenosis. Subclavian arteries widely patent.  Right radial diffusely disease by [place]. 42mL contrast used.   Carotid duplex: 07/27/2020:  Right Carotid: Velocities in the right ICA are consistent with a 1-39% stenosis.  Left Carotid: Velocities in the left ICA are consistent with a 60-79% stenosis.  Vertebrals: Bilateral vertebral arteries demonstrate antegrade flow.   14 day extended Holter monitor: Dominant rhythm atrial fibrillation (HR between 25-137bpm, avg. 59bpm).  Overall HR 25-197 bpm. Avg HR 59 bpm. 1519 pauses that were 3 secs or longer.  The longest pause 9.5 sec at 12:36 AM (07/04/2020), followed by 9.4 sec pause at 4:30 AM (07/11/2020), third longest pause 8.9 sec (07/08/2020). These are auto-triggered events. 20 episodes of NSVT reported. Longest episode 20 beats in duration for 10.5 seconds at an average rate 116 bpm. The fastest episode was 6 beats in duration at average rate of 170 bpm.  Total ventricular ectopic burden <1%. Patient activated events: 0.   IMPRESSION & RECOMMENDATIONS: [name] Rice is a 69 y.o. 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  Preoperative cardiovascular examination for risk stratification: Patient has a complex past medical history as noted above and now presents to the hospital with diarrhea and  laboratory imaging findings consistent with acute cholecystitis.  Patient is currently being evaluated by both gastroenterology and general surgery and plans to undergo ERCP and laparoscopic cholecystectomy.  Cardiology has been consulted for preoperative stratification given his complex past cardiac history.  Clinically patient does not have any anginal chest pain or symptoms suggestive of acute coronary syndrome.  He is overall euvolemic and not in congestive heart failure.  He recently underwent surgically revascularized in September 2021 given multivessel CAD and has done well post operatively.  He has other nonmodifiable cardiovascular risk factors which are optimized.  Most recent echocardiogram notes mildly reduced left ventricular systolic function and no significant valvular heart disease.  In summary, I do believe that the patient should be considered an acceptable risk for the planned ERCP and laparoscopic cholecystectomy.  Given his age, underlying cardiac issues, and current diagnosis of choledocholithiasis with suspected cholecystitis I believe the cardiovascular risk would be at least low to moderate.  I do not believe that this is modifiable at this time and he is optimized from a cardiovascular standpoint.  Patient finds his preoperative risk assessment acceptable. From a cardiovascular standpoint will be available if needed.  Given his underlying persistent atrial fibrillation he is on oral anticoagulation.  His last dose of oral anticoagulation was 09/14/2020.  Recommend holding oral anticoagulation for at least 48 hours prior to upcoming procedures/surgeries.  Residual anticoagulation will be dependent on surgical recommendations as to when appropriate hemostasis is achieved.    Secondary Diagnosis: Choledocholithiasis with suspected cholecystitis: Currently managed by primary, GI, and general surgical team.  Persistent atrial fibrillation, status post biatrial maze  procedure:  Continue beta-blocker therapy.  Xarelto currently held secondary to upcoming procedures/surgeries.  Multivessel CAD status post four-vessel bypass surgery without angina pectoris: Continue beta-blocker therapy, reinitiation of statin therapy once LFTs resolve, recommend aspirin 81 mg p.o. daily post surgery/procedure.  Asymptomatic left carotid artery disease: Scheduled for 39-month follow-up study.  Recommend reinitiation of aspirin and statin therapy when clinically appropriate.  Benign essential hypertension: Blood pressure within acceptable range.  Currently managed per primary team   Total encounter time 85 minutes. *Total Encounter Time as defined by the Centers for Medicare and Medicaid Services includes, in addition to the face-to-face time of a patient visit (documented in the note above) non-face-to-face time: obtaining and reviewing outside history, ordering and reviewing medications, tests or procedures, care coordination (communications with other health care professionals or caregivers) and documentation in the medical record.  Patient's questions and concerns were addressed to his satisfaction. He voices understanding of the instructions provided during this encounter.   This note was created using a voice recognition software as a result there may be grammatical errors inadvertently enclosed that do not reflect the nature of this encounter. Every attempt is made to correct such errors.  [facility]  Pager: [phone] Office: [phone] 09/16/2020, 6:09 PM

## 2020-09-16 NOTE — Progress Notes (Addendum)
Subjective: CC:  Patient reports no abdominal pain, n/v. He cannot remember the last time he had abdominal pain. Mainly discusses the diarrhea he has had for the last few months.  Objective: Vital signs in last 24 hours: Temp:  [97.6 F (36.4 C)-98 F (36.7 C)] 98 F (36.7 C) (11/09 0706) Pulse Rate:  [71-80] 71 (11/09 0706) Resp:  [18-20] 20 (11/09 0706) BP: (117-147)/(65-72) 147/70 (11/09 0706) SpO2:  [98 %-100 %] 98 % (11/09 0706) Weight:  [105 kg] 105 kg (11/09 0706) Last BM Date: 09/14/20  Intake/Output from previous day: 11/08 0701 - 11/09 0700 In: 1164.5 [I.V.:964.4; IV Piggyback:200] Out: -  Intake/Output this shift: Total I/O In: -  Out: 500 [Urine:500]  PE: Gen:  Alert, NAD, pleasant Card:  RRR Pulm:  CTA b/l with normal rate and effort  Abd: Soft, NT/ND, +BS Psych: A&Ox3  Skin: no rashes noted, warm and dry   Lab Results:  Recent Labs    09/14/20 1240 09/15/20 0441  WBC 13.4* 10.4  HGB 13.1 11.4*  HCT 42.5 38.6*  PLT 486* 376   BMET Recent Labs    09/15/20 0441 09/16/20 0506  NA 137 140  K 3.4* 3.4*  CL 103 106  CO2 22 22  GLUCOSE 79 67*  BUN 16 11  CREATININE 1.02 1.11  CALCIUM 7.8* 8.3*   PT/INR Recent Labs    09/15/20 0441  LABPROT 19.5*  INR 1.7*   CMP     Component Value Date/Time   NA 140 09/16/2020 0506   NA 140 08/12/2020 0926   K 3.4 (L) 09/16/2020 0506   CL 106 09/16/2020 0506   CO2 22 09/16/2020 0506   GLUCOSE 67 (L) 09/16/2020 0506   BUN 11 09/16/2020 0506   BUN 18 08/12/2020 0926   CREATININE 1.11 09/16/2020 0506   CALCIUM 8.3 (L) 09/16/2020 0506   PROT 6.1 (L) 09/16/2020 0506   PROT 6.5 12/13/2019 0936   ALBUMIN 2.3 (L) 09/16/2020 0506   ALBUMIN 4.1 12/13/2019 0936   AST 41 09/16/2020 0506   ALT 51 (H) 09/16/2020 0506   ALKPHOS 592 (H) 09/16/2020 0506   BILITOT 1.5 (H) 09/16/2020 0506   BILITOT 1.8 (H) 12/13/2019 0936   GFRNONAA >60 09/16/2020 0506   GFRAA 97 08/12/2020 0926   Lipase       Component Value Date/Time   LIPASE 36 09/14/2020 1240       Studies/Results: CT Abdomen Pelvis W Contrast  Result Date: 09/14/2020 CLINICAL DATA:  Diarrhea after surgery, fatigue, weakness, history atrial fibrillation, coronary artery disease, hypertension, post CABG and Maze procedure on 07/29/2020 EXAM: CT ABDOMEN AND PELVIS WITH CONTRAST TECHNIQUE: Multidetector CT imaging of the abdomen and pelvis was performed using the standard protocol following bolus administration of intravenous contrast. Sagittal and coronal MPR images reconstructed from axial data set. CONTRAST:  OMNIPAQUE IOHEXOL 300 MG/ML SOLN IV. No oral contrast. COMPARISON:  07/16/2017 FINDINGS: Lower chest: Small to moderate LEFT pleural effusion with partial atelectasis of LEFT lower lobe. Minimal RIGHT pleural effusion and basilar atelectasis dependently. Calcified granuloma RIGHT lower lobe. Hepatobiliary: Distended gallbladder with wall thickening and pericholecystic infiltrative changes concerning for acute cholecystitis. No definite calcified gallstones. Dilated CBD up to 19 mm diameter to ampulla. No definite calcification at duodenal junction though questionable wall thickening at the ampulla is present. Intrahepatic biliary dilatation present. No focal hepatic mass lesion. Pancreas: No focal abnormalities Spleen: Normal appearance Adrenals/Urinary Tract: 5 mm calculus at central LEFT  kidney. Cortical scarring LEFT kidney. Adrenal glands, kidneys, and ureters otherwise normal appearance. Bladder contains small amount of urine and extends into a RIGHT inguinal hernia. Stomach/Bowel: Minimal colonic diverticulosis without evidence of diverticulitis. Normal appendix. Stomach decompressed containing radiodense material question medication. Remaining bowel loops unremarkable. Vascular/Lymphatic: Atherosclerotic calcifications aorta and iliac arteries without aneurysm. No adenopathy Reproductive: Unremarkable prostate gland and  seminal vesicles. Other: RIGHT inguinal hernia containing fat and portion of the urinary bladder. No bowel herniation. No additional hernia is identified. No free air or free fluid. Musculoskeletal: Osseous demineralization. IMPRESSION: Distended gallbladder with wall thickening and pericholecystic infiltrative changes concerning for acute cholecystitis. Dilated CBD up to 19 mm diameter with intrahepatic biliary dilatation, recommend correlation with LFTs; cause of biliary dilatation is uncertain, without calcification evident, could be due to noncalcified stone, mass, or stricture. This could be assessed by MRCP imaging for further evaluation. RIGHT inguinal hernia containing fat and a portion of the urinary bladder. 5 minutes mm nonobstructing LEFT renal calculus. Small to moderate LEFT and minimal RIGHT pleural effusions with bibasilar atelectasis greater on LEFT. Minimal colonic diverticulosis without evidence of diverticulitis. Aortic Atherosclerosis (ICD10-I70.0). Electronically Signed   By: Ulyses SouthwardMark  Boles M.D.   On: 09/14/2020 15:17   MR 3D Recon At Scanner  Result Date: 09/16/2020 CLINICAL DATA:  Elevated liver function studies and biliary dilatation on CT scan. EXAM: MRI ABDOMEN WITHOUT AND WITH CONTRAST (INCLUDING MRCP) TECHNIQUE: Multiplanar multisequence MR imaging of the abdomen was performed both before and after the administration of intravenous contrast. Heavily T2-weighted images of the biliary and pancreatic ducts were obtained, and three-dimensional MRCP images were rendered by post processing. CONTRAST:  10mL GADAVIST GADOBUTROL 1 MMOL/ML IV SOLN COMPARISON:  CT scan 09/14/2020 and ultrasound 09/04/2020 FINDINGS: Examination limited due to breathing motion artifact. Lower chest: There is a left pleural effusion and left lower lobe infiltrate or atelectasis. No pericardial effusion. Hepatobiliary: Moderate intrahepatic biliary dilatation and significant common bile duct dilatation measuring up to  19 mm. The gallbladder demonstrates distension and there is layering sludge in the gallbladder and possible tiny stones. Similar findings are demonstrated in the common bile duct with layering sludge. No large obstructing common bile duct stone is identified. No hepatic lesions. The portal and hepatic veins are patent. Marked wall thickening suggesting acute cholecystitis. Pancreas:  No mass, inflammation or ductal dilatation. Spleen:  Normal size. No focal lesions. Adrenals/Urinary Tract: The adrenal glands and kidneys are unremarkable. No renal lesions are hydronephrosis. Stomach/Bowel: The stomach, duodenum, visualized small bowel and visualized colon are grossly normal. Vascular/Lymphatic: Age advanced atherosclerotic calcifications involving the aorta but no aneurysm or dissection. The branch vessels are patent. No mesenteric or retroperitoneal mass or adenopathy. Other: No ascites or abdominal wall hernia. Small scattered upper abdominal lymph nodes. Musculoskeletal: No significant bony findings. IMPRESSION: 1. Moderate intrahepatic biliary dilatation and significant common bile duct dilatation measuring up to 19 mm. No large obstructing common bile duct stone is identified. There is layering sludge in the common bile duct. No ampullary mass or pancreatic head mass. 2. Distended gallbladder with marked gallbladder wall thickening suggesting acute cholecystitis. Layering sludge and possible tiny stones. 3. Left pleural effusion and left lower lobe infiltrate or atelectasis. Electronically Signed   By: Rudie MeyerP.  Gallerani M.D.   On: 09/16/2020 05:10   DG Chest Port 1 View  Result Date: 09/14/2020 CLINICAL DATA:  Acute cholecystitis, diarrhea for 2 months, abdominal distension EXAM: PORTABLE CHEST 1 VIEW COMPARISON:  09/01/2020 FINDINGS: Single frontal view of the  chest demonstrates a stable cardiac silhouette. Chronic left basilar consolidation and effusion, stable. Right chest is clear. No pneumothorax. Chronic  postsurgical changes from median sternotomy. IMPRESSION: 1. Persistent left basilar consolidation and effusion. Electronically Signed   By: Sharlet Salina M.D.   On: 09/14/2020 16:41   MR ABDOMEN MRCP W WO CONTAST  Result Date: 09/16/2020 CLINICAL DATA:  Elevated liver function studies and biliary dilatation on CT scan. EXAM: MRI ABDOMEN WITHOUT AND WITH CONTRAST (INCLUDING MRCP) TECHNIQUE: Multiplanar multisequence MR imaging of the abdomen was performed both before and after the administration of intravenous contrast. Heavily T2-weighted images of the biliary and pancreatic ducts were obtained, and three-dimensional MRCP images were rendered by post processing. CONTRAST:  46mL GADAVIST GADOBUTROL 1 MMOL/ML IV SOLN COMPARISON:  CT scan 09/14/2020 and ultrasound 09/04/2020 FINDINGS: Examination limited due to breathing motion artifact. Lower chest: There is a left pleural effusion and left lower lobe infiltrate or atelectasis. No pericardial effusion. Hepatobiliary: Moderate intrahepatic biliary dilatation and significant common bile duct dilatation measuring up to 19 mm. The gallbladder demonstrates distension and there is layering sludge in the gallbladder and possible tiny stones. Similar findings are demonstrated in the common bile duct with layering sludge. No large obstructing common bile duct stone is identified. No hepatic lesions. The portal and hepatic veins are patent. Marked wall thickening suggesting acute cholecystitis. Pancreas:  No mass, inflammation or ductal dilatation. Spleen:  Normal size. No focal lesions. Adrenals/Urinary Tract: The adrenal glands and kidneys are unremarkable. No renal lesions are hydronephrosis. Stomach/Bowel: The stomach, duodenum, visualized small bowel and visualized colon are grossly normal. Vascular/Lymphatic: Age advanced atherosclerotic calcifications involving the aorta but no aneurysm or dissection. The branch vessels are patent. No mesenteric or retroperitoneal  mass or adenopathy. Other: No ascites or abdominal wall hernia. Small scattered upper abdominal lymph nodes. Musculoskeletal: No significant bony findings. IMPRESSION: 1. Moderate intrahepatic biliary dilatation and significant common bile duct dilatation measuring up to 19 mm. No large obstructing common bile duct stone is identified. There is layering sludge in the common bile duct. No ampullary mass or pancreatic head mass. 2. Distended gallbladder with marked gallbladder wall thickening suggesting acute cholecystitis. Layering sludge and possible tiny stones. 3. Left pleural effusion and left lower lobe infiltrate or atelectasis. Electronically Signed   By: Rudie Meyer M.D.   On: 09/16/2020 05:10    Anti-infectives: Anti-infectives (From admission, onward)   Start     Dose/Rate Route Frequency Ordered Stop   09/14/20 2200  piperacillin-tazobactam (ZOSYN) IVPB 3.375 g        3.375 g 12.5 mL/hr over 240 Minutes Intravenous Every 8 hours 09/14/20 2055     09/14/20 1600  piperacillin-tazobactam (ZOSYN) IVPB 3.375 g        3.375 g 100 mL/hr over 30 Minutes Intravenous  Once 09/14/20 1558 09/14/20 1719       Assessment/Plan CAD - s/p CABG x 4 07/29/2020 by Dr. Arnoldo Morale. Patient will need cardiac clearance. Appears his primary cardiologist is Dr. Jacinto Halim PAF -on Xarelto at home. Currently on hold, last dose 09/14/2020 Hypertension Diarrhea - notes ongoing diarrhea x 2 months. Per GI  Choledocholithiasis  Cholelithiasis with likely Cholecystitis  - MRCP yesterday showed CBD measuring 8mm w/ layering sludge in the common bile duct. Expect patient will require ERCP. Will defer to GI.   If patient performs ERCP, will plan for Lap Chole during admission pending cardiac clearance. Continue IV abx.  FEN: NPO pending GI recs. IVF ID: Zosyn 11/7>> day  3 DVT: SCDs only, home Xarelto on hold Follow-up: TBD   LOS: 2 days    Jacinto Halim , [facility] 09/16/2020, 8:46  AM Please see Amion for pager number during day hours 7:00am-4:30pm  Agree with above. He's had no abdominal symptoms. Okay to start liquids from our standpoint.  Not sure why he is NPO, unless a procedure is planned for today.  Ovidio Kin, MD, [facility] Office phone:  [phone]

## 2020-09-17 ENCOUNTER — Inpatient Hospital Stay (HOSPITAL_COMMUNITY): Payer: Medicare Other | Admitting: Anesthesiology

## 2020-09-17 ENCOUNTER — Inpatient Hospital Stay (HOSPITAL_COMMUNITY): Payer: Medicare Other

## 2020-09-17 ENCOUNTER — Ambulatory Visit: Payer: Medicare Other | Admitting: Cardiology

## 2020-09-17 ENCOUNTER — Encounter (HOSPITAL_COMMUNITY)
Admission: EM | Disposition: A | Discharge status: Home Health | Payer: Self-pay | Source: Home / Self Care | Attending: Internal Medicine

## 2020-09-17 ENCOUNTER — Encounter (HOSPITAL_COMMUNITY): Payer: Self-pay | Admitting: Internal Medicine

## 2020-09-17 DIAGNOSIS — K8051 Calculus of bile duct without cholangitis or cholecystitis with obstruction: Secondary | ICD-10-CM

## 2020-09-17 DIAGNOSIS — R933 Abnormal findings on diagnostic imaging of other parts of digestive tract: Secondary | ICD-10-CM | POA: Diagnosis not present

## 2020-09-17 DIAGNOSIS — K810 Acute cholecystitis: Secondary | ICD-10-CM | POA: Diagnosis not present

## 2020-09-17 DIAGNOSIS — R748 Abnormal levels of other serum enzymes: Secondary | ICD-10-CM | POA: Diagnosis not present

## 2020-09-17 DIAGNOSIS — R7989 Other specified abnormal findings of blood chemistry: Secondary | ICD-10-CM

## 2020-09-17 DIAGNOSIS — R1011 Right upper quadrant pain: Secondary | ICD-10-CM | POA: Diagnosis not present

## 2020-09-17 DIAGNOSIS — I4819 Other persistent atrial fibrillation: Secondary | ICD-10-CM

## 2020-09-17 DIAGNOSIS — K831 Obstruction of bile duct: Secondary | ICD-10-CM

## 2020-09-17 HISTORY — PX: SPHINCTEROTOMY: SHX5279

## 2020-09-17 HISTORY — PX: REMOVAL OF STONES: SHX5545

## 2020-09-17 HISTORY — PX: ERCP: SHX5425

## 2020-09-17 LAB — PROTIME-INR
INR: 1.3 — ABNORMAL HIGH (ref 0.8–1.2)
Prothrombin Time: 15.2 seconds (ref 11.4–15.2)

## 2020-09-17 LAB — COMPREHENSIVE METABOLIC PANEL
ALT: 51 U/L — ABNORMAL HIGH (ref 0–44)
AST: 47 U/L — ABNORMAL HIGH (ref 15–41)
Albumin: 2.5 g/dL — ABNORMAL LOW (ref 3.5–5.0)
Alkaline Phosphatase: 655 U/L — ABNORMAL HIGH (ref 38–126)
Anion gap: 7 (ref 5–15)
BUN: 8 mg/dL (ref 8–23)
CO2: 24 mmol/L (ref 22–32)
Calcium: 7.9 mg/dL — ABNORMAL LOW (ref 8.9–10.3)
Chloride: 104 mmol/L (ref 98–111)
Creatinine, Ser: 1.01 mg/dL (ref 0.61–1.24)
GFR, Estimated: >60 mL/min (ref >60)
Glucose, Bld: 104 mg/dL — ABNORMAL HIGH (ref 70–99)
Potassium: 3.1 mmol/L — ABNORMAL LOW (ref 3.5–5.1)
Sodium: 135 mmol/L (ref 135–145)
Total Bilirubin: 1.1 mg/dL (ref 0.3–1.2)
Total Protein: 6.2 g/dL — ABNORMAL LOW (ref 6.5–8.1)

## 2020-09-17 LAB — MAGNESIUM: Magnesium: 1.9 mg/dL (ref 1.7–2.4)

## 2020-09-17 LAB — CBC
HCT: 39.9 % (ref 39.0–52.0)
Hemoglobin: 12 g/dL — ABNORMAL LOW (ref 13.0–17.0)
MCH: 28.4 pg (ref 26.0–34.0)
MCHC: 30.1 g/dL (ref 30.0–36.0)
MCV: 94.3 fL (ref 80.0–100.0)
Platelets: 397 10*3/uL (ref 150–400)
RBC: 4.23 MIL/uL (ref 4.22–5.81)
RDW: 17.2 % — ABNORMAL HIGH (ref 11.5–15.5)
WBC: 9.1 10*3/uL (ref 4.0–10.5)
nRBC: 0 % (ref 0.0–0.2)

## 2020-09-17 LAB — POTASSIUM
Potassium: 3.3 mmol/L — ABNORMAL LOW (ref 3.5–5.1)
Potassium: 3.7 mmol/L (ref 3.5–5.1)

## 2020-09-17 SURGERY — ERCP, WITH INTERVENTION IF INDICATED
Anesthesia: General

## 2020-09-17 MED ORDER — DEXMEDETOMIDINE (PRECEDEX) IN NS 20 MCG/5ML (4 MCG/ML) IV SYRINGE
PREFILLED_SYRINGE | INTRAVENOUS | Status: AC
  Filled 2020-09-17: qty 5

## 2020-09-17 MED ORDER — PROPOFOL 10 MG/ML IV BOLUS
INTRAVENOUS | Status: AC
  Filled 2020-09-17: qty 20

## 2020-09-17 MED ORDER — FENTANYL CITRATE (PF) 100 MCG/2ML IJ SOLN
INTRAMUSCULAR | Status: AC
  Filled 2020-09-17: qty 2

## 2020-09-17 MED ORDER — ROCURONIUM BROMIDE 10 MG/ML (PF) SYRINGE
PREFILLED_SYRINGE | INTRAVENOUS | Status: DC | PRN
  Administered 2020-09-17: 50 mg via INTRAVENOUS

## 2020-09-17 MED ORDER — ONDANSETRON HCL 4 MG/2ML IJ SOLN
INTRAMUSCULAR | Status: DC | PRN
  Administered 2020-09-17: 4 mg via INTRAVENOUS

## 2020-09-17 MED ORDER — GLYCOPYRROLATE PF 0.2 MG/ML IJ SOSY
PREFILLED_SYRINGE | INTRAMUSCULAR | Status: AC
  Filled 2020-09-17: qty 1

## 2020-09-17 MED ORDER — PHENYLEPHRINE 40 MCG/ML (10ML) SYRINGE FOR IV PUSH (FOR BLOOD PRESSURE SUPPORT)
PREFILLED_SYRINGE | INTRAVENOUS | Status: DC | PRN
  Administered 2020-09-17: 120 ug via INTRAVENOUS
  Administered 2020-09-17: 200 ug via INTRAVENOUS
  Administered 2020-09-17: 120 ug via INTRAVENOUS
  Administered 2020-09-17: 200 ug via INTRAVENOUS

## 2020-09-17 MED ORDER — SODIUM CHLORIDE 0.9 % IV SOLN
INTRAVENOUS | Status: DC | PRN
  Administered 2020-09-17: 20 mL

## 2020-09-17 MED ORDER — INDOMETHACIN 50 MG RE SUPP
RECTAL | Status: DC | PRN
  Administered 2020-09-17: 100 mg via RECTAL

## 2020-09-17 MED ORDER — ONDANSETRON HCL 4 MG/2ML IJ SOLN
INTRAMUSCULAR | Status: AC
  Filled 2020-09-17: qty 2

## 2020-09-17 MED ORDER — DEXAMETHASONE SODIUM PHOSPHATE 10 MG/ML IJ SOLN
INTRAMUSCULAR | Status: AC
  Filled 2020-09-17: qty 1

## 2020-09-17 MED ORDER — DEXAMETHASONE SODIUM PHOSPHATE 4 MG/ML IJ SOLN
INTRAMUSCULAR | Status: DC | PRN
  Administered 2020-09-17: 5 mg via INTRAVENOUS

## 2020-09-17 MED ORDER — PROPOFOL 10 MG/ML IV BOLUS
INTRAVENOUS | Status: DC | PRN
  Administered 2020-09-17: 200 mg via INTRAVENOUS

## 2020-09-17 MED ORDER — LIDOCAINE 2% (20 MG/ML) 5 ML SYRINGE
INTRAMUSCULAR | Status: DC | PRN
  Administered 2020-09-17: 80 mg via INTRAVENOUS

## 2020-09-17 MED ORDER — GLUCAGON HCL RDNA (DIAGNOSTIC) 1 MG IJ SOLR
INTRAMUSCULAR | Status: DC | PRN
  Administered 2020-09-17: .25 mg via INTRAVENOUS

## 2020-09-17 MED ORDER — GLUCAGON HCL RDNA (DIAGNOSTIC) 1 MG IJ SOLR
INTRAMUSCULAR | Status: AC
  Filled 2020-09-17: qty 1

## 2020-09-17 MED ORDER — EPHEDRINE SULFATE-NACL 50-0.9 MG/10ML-% IV SOSY
PREFILLED_SYRINGE | INTRAVENOUS | Status: DC | PRN
  Administered 2020-09-17 (×3): 10 mg via INTRAVENOUS

## 2020-09-17 MED ORDER — LACTATED RINGERS IV SOLN: INTRAVENOUS | Status: DC | PRN

## 2020-09-17 MED ORDER — LIDOCAINE 2% (20 MG/ML) 5 ML SYRINGE
INTRAMUSCULAR | Status: AC
  Filled 2020-09-17: qty 5

## 2020-09-17 MED ORDER — KCL IN DEXTROSE-NACL 40-5-0.9 MEQ/L-%-% IV SOLN
INTRAVENOUS | Status: DC
  Filled 2020-09-17 (×3): qty 1000

## 2020-09-17 MED ORDER — INDOMETHACIN 50 MG RE SUPP
RECTAL | Status: AC
  Filled 2020-09-17: qty 2

## 2020-09-17 MED ORDER — SUGAMMADEX SODIUM 200 MG/2ML IV SOLN
INTRAVENOUS | Status: DC | PRN
  Administered 2020-09-17: 300 mg via INTRAVENOUS

## 2020-09-17 MED ORDER — FENTANYL CITRATE (PF) 100 MCG/2ML IJ SOLN
INTRAMUSCULAR | Status: DC | PRN
  Administered 2020-09-17: 100 ug via INTRAVENOUS

## 2020-09-17 MED ORDER — SUCCINYLCHOLINE CHLORIDE 200 MG/10ML IV SOSY
PREFILLED_SYRINGE | INTRAVENOUS | Status: DC | PRN
  Administered 2020-09-17: 160 mg via INTRAVENOUS

## 2020-09-17 MED ORDER — POTASSIUM CHLORIDE 20 MEQ PO PACK
40.0000 meq | PACK | Freq: Once | ORAL | Status: AC
  Administered 2020-09-17: 40 meq via ORAL
  Filled 2020-09-17: qty 2

## 2020-09-17 MED ORDER — PHENYLEPHRINE HCL-NACL 10-0.9 MG/250ML-% IV SOLN
INTRAVENOUS | Status: DC | PRN
  Administered 2020-09-17: 100 ug/min via INTRAVENOUS

## 2020-09-17 NOTE — Anesthesia Procedure Notes (Signed)
Procedure Name: Intubation Date/Time: 09/17/2020 1:56 PM Performed by: Vanessa Knippa, CRNA Pre-anesthesia Checklist: Patient identified, Emergency Drugs available, Suction available and Patient being monitored Patient Re-evaluated:Patient Re-evaluated prior to induction Oxygen Delivery Method: Circle system utilized Preoxygenation: Pre-oxygenation with 100% oxygen Induction Type: IV induction Ventilation: Mask ventilation without difficulty Laryngoscope Size: 2 and Miller Grade View: Grade I Tube type: Oral Tube size: 7.5 mm Number of attempts: 1 Airway Equipment and Method: Stylet Placement Confirmation: ETT inserted through vocal cords under direct vision,  positive ETCO2 and breath sounds checked- equal and bilateral Secured at: 22 cm Tube secured with: Tape Dental Injury: Teeth and Oropharynx as per pre-operative assessment

## 2020-09-17 NOTE — Transfer of Care (Signed)
Immediate Anesthesia Transfer of Care Note  Patient: [name] Rice  Procedure(s) Performed: ENDOSCOPIC RETROGRADE CHOLANGIOPANCREATOGRAPHY (ERCP) (N/A ) SPHINCTEROTOMY REMOVAL OF STONES  Patient Location: PACU  Anesthesia Type:General  Level of Consciousness: awake, alert , oriented and patient cooperative  Airway & Oxygen Therapy: Patient Spontanous Breathing and Patient connected to face mask  Post-op Assessment: Report given to RN and Post -op Vital signs reviewed and stable  Post vital signs: Reviewed and stable  Last Vitals:  Vitals Value Taken Time  BP    Temp    Pulse    Resp    SpO2      Last Pain:  Vitals:   09/17/20 1336  TempSrc: Oral  PainSc: 0-No pain      Patients Stated Pain Goal: 0 (09/17/20 0910)  Complications: No complications documented.

## 2020-09-17 NOTE — Progress Notes (Signed)
[facility]  Aware that potassium has dropped overnight, ordered Potassium 40mEq to be given this morning, will recheck at 11:00.  This may potentially delay ERCP this afternoon. Will discuss with Dr. Stark after next potassium check.  [name] Toto, PA-C 

## 2020-09-17 NOTE — Care Management Important Message (Signed)
Important Message  Patient Details IM Letter given to the Patient Name: [name] Rice MRN: [id number] Date of Birth: Aug 21, 1951   Medicare Important Message Given:  Yes     [name] 09/17/2020, 9:56 AM

## 2020-09-17 NOTE — H&P (View-Only) (Signed)
[facility]  Aware that potassium has dropped overnight, ordered Potassium to be given this morning, will recheck at 11:00.  This may potentially delay ERCP this afternoon. Will discuss with Dr. [name] after next potassium check.  Hyacinth Meeker, PA-C

## 2020-09-17 NOTE — Interval H&P Note (Signed)
History and Physical Interval Note:  09/17/2020 1:45 PM  [name] Rice  has presented today for surgery, with the diagnosis of Choledocholithiasis.  The various methods of treatment have been discussed with the patient and family. After consideration of risks, benefits and other options for treatment, the patient has consented to  Procedure(s): ENDOSCOPIC RETROGRADE CHOLANGIOPANCREATOGRAPHY (ERCP) (N/A) as a surgical intervention.  The patient's history has been reviewed, patient examined, no change in status, stable for surgery.  I have reviewed the patient's chart and labs.  Questions were answered to the patient's satisfaction.     Venita Lick. [name]

## 2020-09-17 NOTE — Interval H&P Note (Signed)
History and Physical Interval Note:  09/17/2020 1:46 PM  [name] Rice  has presented today for surgery, with the diagnosis of Choledocholithiasis.  The various methods of treatment have been discussed with the patient and family. After consideration of risks, benefits and other options for treatment, the patient has consented to  Procedure(s): ENDOSCOPIC RETROGRADE CHOLANGIOPANCREATOGRAPHY (ERCP) (N/A) as a surgical intervention.  The patient's history has been reviewed, patient examined, no change in status, stable for surgery.  I have reviewed the patient's chart and labs.  Questions were answered to the patient's satisfaction.     Venita Lick. [name]

## 2020-09-17 NOTE — Progress Notes (Addendum)
Day of Surgery    FV:CBSWHQPR x2 months, fatigue/weakness  Subjective:  Diarrhea remains his primary complaint.  C. difficile is pending.  GI PCR is also pending.  He has no pain but he says he has had a rolling stomach since he ate something yesterday.  He has no pain on exam.  Objective: Vital signs in last 24 hours: Temp:  [97.7 F (36.5 C)-97.8 F (36.6 C)] 97.7 F (36.5 C) (11/10 0435) Pulse Rate:  [79-90] 90 (11/10 0435) Resp:  [13-20] 20 (11/10 0435) BP: (112-140)/(59-78) 112/69 (11/10 0435) SpO2:  [95 %-100 %] 95 % (11/10 0435) Weight:  [105.8 kg] 105.8 kg (11/10 0440) Last BM Date: 09/16/20 400 p.o. 3200 IV 1325 urine BM x2 Afebrile vital signs are stable CMP shows a potassium of 3.1, glucose 104, alk phos 655, AST 47, ALT 51, total bilirubin 1.1 WBC 9.1 H/H 12/39.9, platelets 397,000. Mag checked >>1.9  Intake/Output from previous day: 11/09 0701 - 11/10 0700 In: 3649 [P.O.:400; I.V.:3082.4; IV Piggyback:166.6] Out: 1325 [Urine:1325] Intake/Output this shift: No intake/output data recorded.  General appearance: alert, cooperative and no distress Resp: clear to auscultation bilaterally GI: soft , nontender, few BS, feels like stomach is not right since eating yesterday  Lab Results:  Recent Labs    09/15/20 0441 09/17/20 0441  WBC 10.4 9.1  HGB 11.4* 12.0*  HCT 38.6* 39.9  PLT 376 397    BMET Recent Labs    09/16/20 0506 09/17/20 0441  NA 140 135  K 3.4* 3.1*  CL 106 104  CO2 22 24  GLUCOSE 67* 104*  BUN 11 8  CREATININE 1.11 1.01  CALCIUM 8.3* 7.9*   PT/INR Recent Labs    09/15/20 0441 09/17/20 0441  LABPROT 19.5* 15.2  INR 1.7* 1.3*    Recent Labs  Lab 09/14/20 1240 09/15/20 0441 09/16/20 0506 09/17/20 0441  AST 105* 61* 41 47*  ALT 78* 61* 51* 51*  ALKPHOS 796* 690* 592* 655*  BILITOT 1.6* 1.6* 1.5* 1.1  PROT 6.9 5.9* 6.1* 6.2*  ALBUMIN 2.5* 2.4* 2.3* 2.5*     Lipase     Component Value Date/Time   LIPASE 36  09/14/2020 1240     Medications: . amLODipine  5 mg Oral QHS  . feeding supplement  1 Container Oral TID BM  . metoprolol tartrate  12.5 mg Oral Daily  . potassium chloride  40 mEq Oral Once   . sodium chloride    . dextrose 5 % and 0.45 % NaCl with KCl 20 mEq/L 125 mL/hr at 09/17/20 0137  . piperacillin-tazobactam (ZOSYN)  IV 3.375 g (09/17/20 0517)   Assessment/Plan CAD - s/p CABGx 49/21/2021 by Dr. Bobbe Medico. Patient will need cardiac clearance.   Appears his primary cardiologist is Dr. Einar Gip PAF-on Xarelto at home.   Currently on hold, last dose 09/14/2020 Hypertension Diarrhea - notes ongoing diarrhea x 2 months. Per GI Hypokalemia  -K+ 3.1  Choledocholithiasis  Cholelithiasis with likely Cholecystitis             - MRCP yesterday showed CBD measuring 78m w/ layering sludge in the common bile duct. Expect patient will require ERCP. Will defer to GI.              If patient performs ERCP, will plan for Lap Chole during admission pending cardiac clearance. Continue IV abx. ERCP pending potassium replacement  FEN: NPO pending GI recs. IVF ID: Zosyn 11/7>>day 3 DVT: SCDs only, home Xarelto on hold Follow-up: TBD  Plan: Potassium is being replaced.  Hopefully he will get his ERCP today recheck labs in the a.m. for possible cholecystectomy tomorrow.    Labs ordered for the a.m. along with n.p.o. after midnight.   LOS: 3 days    JENNINGS,WILLARD 09/17/2020 Please see Amion  Agree with above.  ERCP today by Dr. Fuller Plan - stones and debris found in CBD.  Successful sphincterotomy. He has done well from this - he is taking liquids and has no abdominal symptoms. Will plan lap chole tomorrow.  I discussed with the patient the indications and risks of gall bladder surgery.  The primary risks of gall bladder surgery include, but are not limited to, bleeding, infection, common bile duct injury, and open surgery.  There is also the risk that the patient may have continued  symptoms after surgery.  We discussed the typical post-operative recovery course. I tried to answer the patient's questions.  Alphonsa Overall, MD, [facility] Office phone:  [phone]

## 2020-09-17 NOTE — Progress Notes (Signed)
PROGRESS NOTE  [name] BankerJames R Rice  WUJ:[id number] DOB: 1951/04/03 DOA: 09/14/2020 PCP: Richmond CampbellKaplan, Kristen W., PA-C   Brief Narrative: [name] ArgyleJames R Wilsonis a 69 y.o.malewith a history of CAD s/p 4v CABG 07/29/2020, HTN, PAF s/p MAZE on xarelto who presented to the ED 11/7 with diarrhea and abdominal pain. Work up demonstrated enlarged common bile duct (19mm) with layering sludge for which GI and surgery were consulted. ERCP performed 09/17/2020 with successful extraction of CBD stone and sludge. Lap chole planned 11/11.  Assessment & Plan: Principal Problem:   Acute cholecystitis Active Problems:   Atrial fibrillation (HCC)   Pure hypercholesterolemia   Hypokalemia   CAD, multiple vessel   AKI (acute kidney injury) (HCC)   Sepsis (HCC)   Abdominal pain   Abnormal CT of the abdomen   Abnormal liver enzymes   Choledocholithiasis with acute cholecystitis   Abnormal magnetic resonance cholangiopancreatography (MRCP)   Common bile duct (CBD) obstruction   Elevated LFTs  Choledocholithiasis: s/p removal of stones and sludge by biliary sphincterotomy and balloon extraction at ERCP 11/10.  - Follow up pathology results from biopsy of abnormal mucosa at ampullary orifice  - Avoid aspirin, NSAIDs x1 week.  - Follow LFTs - Ok per GI to restart xarelto 11/13, will need surgery clearance in this regard prior to restarting.                 Cholelithiasis, suspected acute cholecystitis:  - Laparoscopic cholecystectomy tentatively planned by surgery 09/18/2020. NPO p MN. - Continuing zosyn   Diarrhea: Unclear etiology.  - CDiff and GI pathogen panel.  - Follow with GI.            Hypokalemia:  - Continue supplementation and monitoring. Check AM Mg.   Persistent atrial fibrillation s/p biatrial MAZE, LAA clipping:  - Holding xarelto. Continue BB, rate is controlled. Continuing telemetry due to AFib and hx NSVT.  CAD s/p CABG x4 September 2021, LBBB, HTN, HLD: No angina or heart failure symptoms.    - Continue metoprolol, statin, norvasc - Restart aspirin in 1 week per GI recommendations.  - Per cardiology, pt's CV risk is low-moderate.   Left carotid stenosis:  - Outpatient follow up recommended.   LFT elevation:  - Monitoring.  DVT prophylaxis: SCDs Code Status: Full Family Communication: None at bedside Disposition Plan:  Status is: Inpatient  Remains inpatient appropriate because:Ongoing diagnostic testing needed not appropriate for outpatient work up and Inpatient level of care appropriate due to severity of illness  Dispo: The patient is from: Home              Anticipated d/c is to: Home              Anticipated d/c date is: 2 days              Patient currently is not medically stable to d/c.  Consultants:   GI  General surgery  Procedures:   ERCP 09/17/2020 [name]:  Impression:       - Bulging ampulla which decompressed post                            sphinterotomy and CBD clearing.                           - The common bile duct was dilated, with sludge and  stones causing an obstruction.                           - Choledocholithiasis and a large volume of sludge                            was found in the CBD. Complete removal was                            accomplished by biliary sphincterotomy and balloon                            extraction.                           - A biliary sphincterotomy was performed.                           - Area of abnormal mucosa at the ampulla orifice,                            along the sphincterotomy margins was evident after                            sphincterotomy. Biopsied. Recommendation: Avoid aspirin and nonsteroidal anti-inflammatory                            medicines for 1 week.                           - Observe patient's clinical course following                            today's ERCP with therapeutic intervention.                           - Trend LFTs.                            - Await path results.                           - Pending pathology results and clinical course                            further evaluation with EUS may be indicated.                           - Proceed with cholecystectomy per general surgery.                           - OK to resume Xarelto in 3 days as indicated                            however discuss timing with general surgery  regarding scheduling of cholecystectomy.  Antimicrobials:  Zosyn   Subjective: Continues to have chronic diarrhea for months now. No bleeding. Abdominal pain is mild, intermittent. No nausea or vomiting.  Objective: Vitals:   09/17/20 1515 09/17/20 1525 09/17/20 1530 09/17/20 1535  BP: (!)   Pulse: 83 80 84 80  Resp: Temp:      TempSrc:      SpO2: 95% 97% 97% 99%  Weight:      Height:        Intake/Output Summary (Last 24 hours) at 09/17/2020 1656 Last data filed at 09/17/2020 1500 Gross per 24 hour  Intake 4463.47 ml  Output 600 ml  Net 3863.47 ml   Filed Weights   09/14/20 1114 09/16/20 0706 09/17/20 0440  Weight: 104.3 kg 105 kg 105.8 kg    Gen: 69 y.o. male in no distress Pulm: Non-labored breathing room air. Clear to auscultation bilaterally.  CV: Regular rate and rhythm. No murmur, rub, or gallop. No JVD, no pedal edema. GI: Abdomen soft, minimally tender, non-distended, with normoactive bowel sounds. No organomegaly or masses felt. Ext: Warm, no deformities Skin: No rashes, lesions or ulcers Neuro: Alert and oriented. No focal neurological deficits. Psych: Judgement and insight appear normal. Mood & affect appropriate.   Data Reviewed: I have personally reviewed following labs and imaging studies  CBC: Recent Labs  Lab 09/14/20 1240 09/15/20 0441 09/17/20 0441  WBC 13.4* 10.4 9.1  NEUTROABS  --  7.9*  --   HGB 13.1 11.4* 12.0*  HCT 42.5 38.6* 39.9  MCV 91.2 95.8 94.3  PLT 486* 376 397   Basic  Metabolic Panel: Recent Labs  Lab 09/14/20 1240 09/14/20 1240 09/15/20 0006 09/15/20 0441 09/16/20 0506 09/17/20 0441 09/17/20 1030 09/17/20 1256  NA 136  --   --  137 140 135  --   --   K 3.1*   < >  --  3.4* 3.4* 3.1* 3.3* 3.7  CL 97*  --   --  103 106 104  --   --   CO2 23  --   --  --   --   GLUCOSE 103*  --   --  79 67* 104*  --   --   BUN 15  --   --  --   --   CREATININE 1.36*  --   --  1.02 1.11 1.01  --   --   CALCIUM 8.3*  --   --  7.8* 8.3* 7.9*  --   --   MG  --   --  1.9 1.9  --  1.9  --   --    < > = values in this interval not displayed.   GFR: Estimated Creatinine Clearance: 92 mL/min (by C-G formula based on SCr of 1.01 mg/dL). Liver Function Tests: Recent Labs  Lab 09/14/20 1240 09/15/20 0441 09/16/20 0506 09/17/20 0441  AST 105* 61* 41 47*  ALT 78* 61* 51* 51*  ALKPHOS 796* 690* 592* 655*  BILITOT 1.6* 1.6* 1.5* 1.1  PROT 6.9 5.9* 6.1* 6.2*  ALBUMIN 2.5* 2.4* 2.3* 2.5*   Recent Labs  Lab 09/14/20 1240  LIPASE 36   No results for input(s): AMMONIA in the last 168 hours. Coagulation Profile: Recent Labs  Lab 09/15/20 0441 09/17/20 0441  INR 1.7* 1.3*   Cardiac Enzymes: No results for input(s): CKTOTAL, CKMB, CKMBINDEX, TROPONINI in the last  168 hours. BNP (last 3 results) No results for input(s): PROBNP in the last 8760 hours. HbA1C: No results for input(s): HGBA1C in the last 72 hours. CBG: No results for input(s): GLUCAP in the last 168 hours. Lipid Profile: No results for input(s): CHOL, HDL, LDLCALC, TRIG, CHOLHDL, LDLDIRECT in the last 72 hours. Thyroid Function Tests: Recent Labs    09/16/20 0506  TSH 1.083   Anemia Panel: No results for input(s): VITAMINB12, FOLATE, FERRITIN, TIBC, IRON, RETICCTPCT in the last 72 hours. Urine analysis:    Component Value Date/Time   COLORURINE BROWN (A) 09/14/2020 1720   APPEARANCEUR CLOUDY (A) 09/14/2020 1720   LABSPEC <1.005 (L) 09/14/2020 1720   PHURINE 5.5  09/14/2020 1720   GLUCOSEU NEGATIVE 09/14/2020 1720   HGBUR LARGE (A) 09/14/2020 1720   BILIRUBINUR MODERATE (A) 09/14/2020 1720   KETONESUR 15 (A) 09/14/2020 1720   PROTEINUR 30 (A) 09/14/2020 1720   NITRITE NEGATIVE 09/14/2020 1720   LEUKOCYTESUR NEGATIVE 09/14/2020 1720   Recent Results (from the past 240 hour(s))  Culture, blood (routine x 2)     Status: None (Preliminary result)   Collection Time: 09/14/20 12:00 PM   Specimen: BLOOD RIGHT ARM  Result Value Ref Range Status   Specimen Description   Final    BLOOD RIGHT ARM Performed at [facility], [address] [place], [place] [address]    Special Requests   Final    BOTTLES DRAWN AEROBIC AND ANAEROBIC Blood Culture adequate volume Performed at [facility], [address] [place], [place] [address]    Culture   Final    NO GROWTH 3 DAYS Performed at [facility], [address], [place], [place] [address]    Report Status PENDING  Incomplete  C Difficile Quick Screen w PCR reflex     Status: None   Collection Time: 09/14/20  2:20 PM   Specimen: STOOL  Result Value Ref Range Status   C Diff antigen NEGATIVE NEGATIVE Final   C Diff toxin NEGATIVE NEGATIVE Final   C Diff interpretation No C. difficile detected.  Final    Comment: Performed at [facility], [address], [place], [place] [address]  Respiratory Panel by RT PCR (Flu A&B, Covid) - Nasopharyngeal Swab     Status: None   Collection Time: 09/14/20  4:04 PM   Specimen: Nasopharyngeal Swab  Result Value Ref Range Status   SARS Coronavirus 2 by RT PCR NEGATIVE NEGATIVE Final    Comment: (NOTE) SARS-CoV-2 target nucleic acids are NOT DETECTED.  The SARS-CoV-2 RNA is generally detectable in upper respiratoy specimens during the acute phase of infection. The lowest concentration of SARS-CoV-2 viral copies this assay can detect is 131 copies/mL. A negative result does not preclude SARS-Cov-2 infection and should not be used  as the sole basis for treatment or other patient management decisions. A negative result may occur with  improper specimen collection/handling, submission of specimen other than nasopharyngeal swab, presence of viral mutation(s) within the areas targeted by this assay, and inadequate number of viral copies (<131 copies/mL). A negative result must be combined with clinical observations, patient history, and epidemiological information. The expected result is Negative.  Fact Sheet for Patients:  https://www.moore.com/  Fact Sheet for Healthcare Providers:  https://www.young.biz/  This test is no t yet approved or cleared by the [place] FDA and  has been authorized for detection and/or diagnosis of SARS-CoV-2 by FDA under an Emergency  Use Authorization (EUA). This EUA will remain  in effect (meaning this test can be used) for the duration of the COVID-19 declaration under Section 564(b)(1) of the Act, 21 U.S.C. section 360bbb-3(b)(1), unless the authorization is terminated or revoked sooner.     Influenza A by PCR NEGATIVE NEGATIVE Final   Influenza B by PCR NEGATIVE NEGATIVE Final    Comment: (NOTE) The Xpert Xpress SARS-CoV-2/FLU/RSV assay is intended as an aid in  the diagnosis of influenza from Nasopharyngeal swab specimens and  should not be used as a sole basis for treatment. Nasal washings and  aspirates are unacceptable for Xpert Xpress SARS-CoV-2/FLU/RSV  testing.  Fact Sheet for Patients: https://www.moore.com/  Fact Sheet for Healthcare Providers: https://www.young.biz/  This test is not yet approved or cleared by the [place] FDA and  has been authorized for detection and/or diagnosis of SARS-CoV-2 by  FDA under an Emergency Use Authorization (EUA). This EUA will remain  in effect (meaning this test can be used) for the duration of the  Covid-19 declaration under Section  564(b)(1) of the Act, 21  U.S.C. section 360bbb-3(b)(1), unless the authorization is  terminated or revoked. Performed at [facility], [address], [place], [place] [address]       Radiology Studies: MR 3D Recon At Scanner  Result Date: 09/16/2020 CLINICAL DATA:  Elevated liver function studies and biliary dilatation on CT scan. EXAM: MRI ABDOMEN WITHOUT AND WITH CONTRAST (INCLUDING MRCP) TECHNIQUE: Multiplanar multisequence MR imaging of the abdomen was performed both before and after the administration of intravenous contrast. Heavily T2-weighted images of the biliary and pancreatic ducts were obtained, and three-dimensional MRCP images were rendered by post processing. CONTRAST:  88mL GADAVIST GADOBUTROL 1 MMOL/ML IV SOLN COMPARISON:  CT scan 09/14/2020 and ultrasound 09/04/2020 FINDINGS: Examination limited due to breathing motion artifact. Lower chest: There is a left pleural effusion and left lower lobe infiltrate or atelectasis. No pericardial effusion. Hepatobiliary: Moderate intrahepatic biliary dilatation and significant common bile duct dilatation measuring up to 19 mm. The gallbladder demonstrates distension and there is layering sludge in the gallbladder and possible tiny stones. Similar findings are demonstrated in the common bile duct with layering sludge. No large obstructing common bile duct stone is identified. No hepatic lesions. The portal and hepatic veins are patent. Marked wall thickening suggesting acute cholecystitis. Pancreas:  No mass, inflammation or ductal dilatation. Spleen:  Normal size. No focal lesions. Adrenals/Urinary Tract: The adrenal glands and kidneys are unremarkable. No renal lesions are hydronephrosis. Stomach/Bowel: The stomach, duodenum, visualized small bowel and visualized colon are grossly normal. Vascular/Lymphatic: Age advanced atherosclerotic calcifications involving the aorta but no aneurysm or dissection. The branch vessels are patent. No  mesenteric or retroperitoneal mass or adenopathy. Other: No ascites or abdominal wall hernia. Small scattered upper abdominal lymph nodes. Musculoskeletal: No significant bony findings. IMPRESSION: 1. Moderate intrahepatic biliary dilatation and significant common bile duct dilatation measuring up to 19 mm. No large obstructing common bile duct stone is identified. There is layering sludge in the common bile duct. No ampullary mass or pancreatic head mass. 2. Distended gallbladder with marked gallbladder wall thickening suggesting acute cholecystitis. Layering sludge and possible tiny stones. 3. Left pleural effusion and left lower lobe infiltrate or atelectasis. Electronically Signed   By: Rudie Meyer M.D.   On: 09/16/2020 05:10   DG ERCP BILIARY & PANCREATIC DUCTS  Result Date: 09/17/2020 CLINICAL DATA:  69 year old male with a history elevated LFTs EXAM: ERCP TECHNIQUE: Multiple spot  images obtained with the fluoroscopic device and submitted for interpretation post-procedure. FLUOROSCOPY TIME:  Fluoroscopy Time:  3 minutes 54 seconds COMPARISON:  MR 09/15/2020 FINDINGS: Limited intraoperative fluoroscopic spot images during ERCP. Initial image demonstrates the endoscope projecting over the upper abdomen. Subsequently there is cannulation of the ampulla with a safety wire and retrograde infusion of contrast. Vague filling defects in the distal common bile duct. Deployment of a retrieval balloon. IMPRESSION: Limited images during ERCP demonstrates partial opacification of the extrahepatic biliary ducts with deployment of a retrieval balloon. Please refer to the dictated operative report for full details of intraoperative findings and procedure. Electronically Signed   By: Gilmer Mor D.O.   On: 09/17/2020 15:11   MR ABDOMEN MRCP W WO CONTAST  Result Date: 09/16/2020 CLINICAL DATA:  Elevated liver function studies and biliary dilatation on CT scan. EXAM: MRI ABDOMEN WITHOUT AND WITH CONTRAST (INCLUDING  MRCP) TECHNIQUE: Multiplanar multisequence MR imaging of the abdomen was performed both before and after the administration of intravenous contrast. Heavily T2-weighted images of the biliary and pancreatic ducts were obtained, and three-dimensional MRCP images were rendered by post processing. CONTRAST:  72mL GADAVIST GADOBUTROL 1 MMOL/ML IV SOLN COMPARISON:  CT scan 09/14/2020 and ultrasound 09/04/2020 FINDINGS: Examination limited due to breathing motion artifact. Lower chest: There is a left pleural effusion and left lower lobe infiltrate or atelectasis. No pericardial effusion. Hepatobiliary: Moderate intrahepatic biliary dilatation and significant common bile duct dilatation measuring up to 19 mm. The gallbladder demonstrates distension and there is layering sludge in the gallbladder and possible tiny stones. Similar findings are demonstrated in the common bile duct with layering sludge. No large obstructing common bile duct stone is identified. No hepatic lesions. The portal and hepatic veins are patent. Marked wall thickening suggesting acute cholecystitis. Pancreas:  No mass, inflammation or ductal dilatation. Spleen:  Normal size. No focal lesions. Adrenals/Urinary Tract: The adrenal glands and kidneys are unremarkable. No renal lesions are hydronephrosis. Stomach/Bowel: The stomach, duodenum, visualized small bowel and visualized colon are grossly normal. Vascular/Lymphatic: Age advanced atherosclerotic calcifications involving the aorta but no aneurysm or dissection. The branch vessels are patent. No mesenteric or retroperitoneal mass or adenopathy. Other: No ascites or abdominal wall hernia. Small scattered upper abdominal lymph nodes. Musculoskeletal: No significant bony findings. IMPRESSION: 1. Moderate intrahepatic biliary dilatation and significant common bile duct dilatation measuring up to 19 mm. No large obstructing common bile duct stone is identified. There is layering sludge in the common bile  duct. No ampullary mass or pancreatic head mass. 2. Distended gallbladder with marked gallbladder wall thickening suggesting acute cholecystitis. Layering sludge and possible tiny stones. 3. Left pleural effusion and left lower lobe infiltrate or atelectasis. Electronically Signed   By: Rudie Meyer M.D.   On: 09/16/2020 05:10    Scheduled Meds: . amLODipine  5 mg Oral QHS  . feeding supplement  1 Container Oral TID BM  . metoprolol tartrate  12.5 mg Oral Daily   Continuous Infusions: . dextrose 5 % and 0.9 % NaCl with KCl 40 mEq/L 125 mL/hr at 09/17/20 1032  . piperacillin-tazobactam (ZOSYN)  IV Stopped (09/17/20 0917)     LOS: 3 days   Time spent: 35 minutes.  Tyrone Nine, MD [facility] [email or web address] 09/17/2020, 4:56 PM

## 2020-09-17 NOTE — Progress Notes (Signed)
CRITICAL VALUE ALERT  Critical Value:  K - 3.1 from 3.4  Date & Time Notied: 09/17/2020  0559  Provider Notified: Dr. Katherina Right  Orders Received/Actions taken: MD aware/spoke to defer to  AP

## 2020-09-17 NOTE — Op Note (Signed)
[facility] Patient Name: [name] Rice Procedure Date: 09/17/2020 MRN: [id number] Attending MD: Meryl Dare , MD Date of Birth: 1950-12-16 CSN: [id number] Age: 69 Admit Type: Inpatient Procedure:                ERCP Indications:              Common bile duct sludge, stone(s), Abnormal MRCP,                            Elevated liver enzymes Providers:                Venita Lick. [name], MD, Norman Clay, RN, Leanne Lovely, Technician, Nadene Rubins Referring MD:             [facility] Medicines:                General Anesthesia Complications:            No immediate complications. Estimated Blood Loss:     Estimated blood loss was minimal. Procedure:                Pre-Anesthesia Assessment:                           - Prior to the procedure, a History and Physical                            was performed, and patient medications and                            allergies were reviewed. The patient's tolerance of                            previous anesthesia was also reviewed. The risks                            and benefits of the procedure and the sedation                            options and risks were discussed with the patient.                            All questions were answered, and informed consent                            was obtained. Prior Anticoagulants: The patient has                            taken Xarelto (rivaroxaban), last dose was 3 days                            prior to procedure. ASA Grade Assessment: III - A  patient with severe systemic disease. After                            reviewing the risks and benefits, the patient was                            deemed in satisfactory condition to undergo the                            procedure.                           After obtaining informed consent, the scope was                            passed under direct vision. Throughout the                             procedure, the patient's blood pressure, pulse, and                            oxygen saturations were monitored continuously. The                            TJF-Q180V ([id number]) Olympus Duodenoscope was                            introduced through the mouth, and used to inject                            contrast into and used to inject contrast into the                            bile duct. The ERCP was accomplished without                            difficulty. The patient tolerated the procedure                            well. Scope In: Scope Out: Findings:      The scout film was normal. The esophagus was successfully intubated       under direct vision. The scope was advanced to a large, bulging major       papilla in the descending duodenum without detailed examination of the       pharynx, larynx and associated structures, and upper GI tract. The       stomach was J-shaped and several attempts were needed to advance through       the pylorus. The upper GI tract was otherwise grossly normal. The       ampullary orifice was difficult to locate and was found at the distal       aspect of the bulging ampulla, under a fold. It was difficult to       maintain adequate visualization of the orifice. A straight Roadrunner       wire was passed into  the biliary tree. The short-nosed traction       sphincterotome was passed over the guidewire and the bile duct was then       deeply cannulated. Contrast was injected. I personally interpreted the       bile duct images. Ductal flow of contrast was adequate. The common bile       duct contained multiple filling defects thought to be sludge and stones.       The common bile duct was diffusely dilated, with sludge and stones       causing an obstruction. The largest diameter was 18 mm. The intrahepatic       ducts were moderately dilated. The cystic duct and gallbladder did not       fill. A 9 mm biliary sphincterotomy was made  with a braided traction       (standard) sphincterotome using ERBE electrocautery. There was no       post-sphincterotomy bleeding. The biliary tree was swept multiple times       with an 18 mm balloon starting at the bifurcation. A large volume of       sludge and 2 stones were swept from the duct. All were removed. No       stones or sludge remained. The bulging ampulla substantially       decompressed following sphincterotomy and balloon clearing. An area at       the ampullary orifice at the distal margin of the sphincterotomy had       abnormal appearing mucosa which was only evident after sphincterotomy.       This area was nodular and erythematous. Forcep biopsies were obtained.       The biliary tree drained well. The PD was not cannulated or injected by       intention. Impression:               - Bulging ampulla which decompressed post                            sphinterotomy and CBD clearing.                           - The common bile duct was dilated, with sludge and                            stones causing an obstruction.                           - Choledocholithiasis and a large volume of sludge                            was found in the CBD. Complete removal was                            accomplished by biliary sphincterotomy and balloon                            extraction.                           - A biliary sphincterotomy was performed.                           -  Area of abnormal mucosa at the ampulla orifice,                            along the sphincterotomy margins was evident after                            sphincterotomy. Biopsied. Moderate Sedation:      Not Applicable - Patient had care per Anesthesia. Recommendation:           - Avoid aspirin and nonsteroidal anti-inflammatory                            medicines for 1 week.                           - Observe patient's clinical course following                            today's ERCP with therapeutic  intervention.                           - Trend LFTs.                           - Await path results.                           - Pending pathology results and clinical course                            further evaluation with EUS may be indicated.                           - Proceed with cholecystectomy per general surgery.                           - OK to resume Xarelto in 3 days as indicated                            however discuss timing with general surgery                            regarding scheduling of cholecystectomy. Procedure Code(s):        --- Professional ---                           [phone], Endoscopic retrograde                            cholangiopancreatography (ERCP); with removal of                            calculi/debris from biliary/pancreatic duct(s)                           43262, Endoscopic retrograde  cholangiopancreatography (ERCP); with                            sphincterotomy/papillotomy Diagnosis Code(s):        --- Professional ---                           K80.51, Calculus of bile duct without cholangitis                            or cholecystitis with obstruction                           R74.8, Abnormal levels of other serum enzymes                           R93.2, Abnormal findings on diagnostic imaging of                            liver and biliary tract CPT copyright 2019 [employer]. All rights reserved. The codes documented in this report are preliminary and upon coder review may  be revised to meet current compliance requirements. Meryl DareMalcolm T Gaddiel Cullens, MD 09/17/2020 3:18:13 PM This report has been signed electronically. Number of Addenda: 0

## 2020-09-18 ENCOUNTER — Inpatient Hospital Stay (HOSPITAL_COMMUNITY): Payer: Medicare Other | Admitting: Anesthesiology

## 2020-09-18 ENCOUNTER — Encounter (HOSPITAL_COMMUNITY): Payer: Self-pay | Admitting: Internal Medicine

## 2020-09-18 ENCOUNTER — Encounter (HOSPITAL_COMMUNITY)
Admission: EM | Disposition: A | Discharge status: Home Health | Payer: Self-pay | Source: Home / Self Care | Attending: Internal Medicine

## 2020-09-18 DIAGNOSIS — R1011 Right upper quadrant pain: Secondary | ICD-10-CM | POA: Diagnosis not present

## 2020-09-18 DIAGNOSIS — R748 Abnormal levels of other serum enzymes: Secondary | ICD-10-CM | POA: Diagnosis not present

## 2020-09-18 DIAGNOSIS — K810 Acute cholecystitis: Secondary | ICD-10-CM | POA: Diagnosis not present

## 2020-09-18 DIAGNOSIS — R933 Abnormal findings on diagnostic imaging of other parts of digestive tract: Secondary | ICD-10-CM | POA: Diagnosis not present

## 2020-09-18 HISTORY — PX: CHOLECYSTECTOMY: SHX55

## 2020-09-18 LAB — CBC
HCT: 37.9 % — ABNORMAL LOW (ref 39.0–52.0)
Hemoglobin: 11.2 g/dL — ABNORMAL LOW (ref 13.0–17.0)
MCH: 28.4 pg (ref 26.0–34.0)
MCHC: 29.6 g/dL — ABNORMAL LOW (ref 30.0–36.0)
MCV: 95.9 fL (ref 80.0–100.0)
Platelets: 314 10*3/uL (ref 150–400)
RBC: 3.95 MIL/uL — ABNORMAL LOW (ref 4.22–5.81)
RDW: 17 % — ABNORMAL HIGH (ref 11.5–15.5)
WBC: 5.8 10*3/uL (ref 4.0–10.5)
nRBC: 0 % (ref 0.0–0.2)

## 2020-09-18 LAB — LIPASE, BLOOD: Lipase: 40 U/L (ref 11–51)

## 2020-09-18 LAB — COMPREHENSIVE METABOLIC PANEL
ALT: 40 U/L (ref 0–44)
AST: 26 U/L (ref 15–41)
Albumin: 2.4 g/dL — ABNORMAL LOW (ref 3.5–5.0)
Alkaline Phosphatase: 543 U/L — ABNORMAL HIGH (ref 38–126)
Anion gap: 7 (ref 5–15)
BUN: 9 mg/dL (ref 8–23)
CO2: 23 mmol/L (ref 22–32)
Calcium: 8 mg/dL — ABNORMAL LOW (ref 8.9–10.3)
Chloride: 108 mmol/L (ref 98–111)
Creatinine, Ser: 1.44 mg/dL — ABNORMAL HIGH (ref 0.61–1.24)
GFR, Estimated: 53 mL/min — ABNORMAL LOW (ref >60)
Glucose, Bld: 150 mg/dL — ABNORMAL HIGH (ref 70–99)
Potassium: 4.7 mmol/L (ref 3.5–5.1)
Sodium: 138 mmol/L (ref 135–145)
Total Bilirubin: 0.7 mg/dL (ref 0.3–1.2)
Total Protein: 6.3 g/dL — ABNORMAL LOW (ref 6.5–8.1)

## 2020-09-18 LAB — SURGICAL PATHOLOGY

## 2020-09-18 LAB — MAGNESIUM: Magnesium: 1.7 mg/dL (ref 1.7–2.4)

## 2020-09-18 SURGERY — LAPAROSCOPIC CHOLECYSTECTOMY WITH INTRAOPERATIVE CHOLANGIOGRAM
Anesthesia: General | Site: Abdomen

## 2020-09-18 MED ORDER — FENTANYL CITRATE (PF) 250 MCG/5ML IJ SOLN
INTRAMUSCULAR | Status: AC
  Filled 2020-09-18: qty 5

## 2020-09-18 MED ORDER — AMISULPRIDE (ANTIEMETIC) 5 MG/2ML IV SOLN: 10.0000 mg | Freq: Once | INTRAVENOUS | Status: DC | PRN

## 2020-09-18 MED ORDER — MORPHINE SULFATE (PF) 2 MG/ML IV SOLN: 1.0000 mg | INTRAVENOUS | Status: DC | PRN

## 2020-09-18 MED ORDER — PROPOFOL 10 MG/ML IV BOLUS
INTRAVENOUS | Status: DC | PRN
  Administered 2020-09-18: 125 mg via INTRAVENOUS

## 2020-09-18 MED ORDER — ACETAMINOPHEN 10 MG/ML IV SOLN: 1000.0000 mg | Freq: Once | INTRAVENOUS | Status: DC | PRN

## 2020-09-18 MED ORDER — FENTANYL CITRATE (PF) 100 MCG/2ML IJ SOLN
INTRAMUSCULAR | Status: AC
  Filled 2020-09-18: qty 2

## 2020-09-18 MED ORDER — MEPERIDINE HCL 50 MG/ML IJ SOLN: 6.2500 mg | INTRAMUSCULAR | Status: DC | PRN

## 2020-09-18 MED ORDER — ACETAMINOPHEN 160 MG/5ML PO SOLN: 325.0000 mg | Freq: Once | ORAL | Status: DC | PRN

## 2020-09-18 MED ORDER — DEXAMETHASONE SODIUM PHOSPHATE 10 MG/ML IJ SOLN
INTRAMUSCULAR | Status: DC | PRN
  Administered 2020-09-18: 8 mg via INTRAVENOUS

## 2020-09-18 MED ORDER — ONDANSETRON HCL 4 MG/2ML IJ SOLN
INTRAMUSCULAR | Status: AC
  Filled 2020-09-18: qty 2

## 2020-09-18 MED ORDER — LACTATED RINGERS IV SOLN: INTRAVENOUS | Status: DC

## 2020-09-18 MED ORDER — HYDROCODONE-ACETAMINOPHEN 5-325 MG PO TABS
1.0000 | ORAL_TABLET | ORAL | Status: DC | PRN
  Administered 2020-09-18 – 2020-09-20 (×3): 1 via ORAL
  Filled 2020-09-18 (×3): qty 1

## 2020-09-18 MED ORDER — SUGAMMADEX SODIUM 200 MG/2ML IV SOLN
INTRAVENOUS | Status: DC | PRN
  Administered 2020-09-18: 200 mg via INTRAVENOUS
  Administered 2020-09-18: 100 mg via INTRAVENOUS

## 2020-09-18 MED ORDER — EPHEDRINE SULFATE 50 MG/ML IJ SOLN
INTRAMUSCULAR | Status: DC | PRN
  Administered 2020-09-18: 5 mg via INTRAVENOUS
  Administered 2020-09-18: 7 mg via INTRAVENOUS

## 2020-09-18 MED ORDER — SODIUM CHLORIDE 0.45 % IV SOLN: INTRAVENOUS | Status: DC

## 2020-09-18 MED ORDER — MIDAZOLAM HCL 5 MG/5ML IJ SOLN
INTRAMUSCULAR | Status: DC | PRN
  Administered 2020-09-18 (×2): 1 mg via INTRAVENOUS

## 2020-09-18 MED ORDER — BUPIVACAINE-EPINEPHRINE 0.5% -1:200000 IJ SOLN
INTRAMUSCULAR | Status: DC | PRN
  Administered 2020-09-18: 30 mL

## 2020-09-18 MED ORDER — LACTATED RINGERS IR SOLN
Status: DC | PRN
  Administered 2020-09-18: 2000 mL

## 2020-09-18 MED ORDER — PHENYLEPHRINE HCL (PRESSORS) 10 MG/ML IV SOLN
INTRAVENOUS | Status: DC | PRN
  Administered 2020-09-18 (×2): 80 ug via INTRAVENOUS
  Administered 2020-09-18: 60 ug via INTRAVENOUS

## 2020-09-18 MED ORDER — LIDOCAINE 2% (20 MG/ML) 5 ML SYRINGE
INTRAMUSCULAR | Status: AC
  Filled 2020-09-18: qty 5

## 2020-09-18 MED ORDER — BUPIVACAINE-EPINEPHRINE (PF) 0.5% -1:200000 IJ SOLN
INTRAMUSCULAR | Status: AC
  Filled 2020-09-18: qty 30

## 2020-09-18 MED ORDER — PHENYLEPHRINE 40 MCG/ML (10ML) SYRINGE FOR IV PUSH (FOR BLOOD PRESSURE SUPPORT)
PREFILLED_SYRINGE | INTRAVENOUS | Status: AC
  Filled 2020-09-18: qty 10

## 2020-09-18 MED ORDER — FENTANYL CITRATE (PF) 100 MCG/2ML IJ SOLN
25.0000 ug | INTRAMUSCULAR | Status: DC | PRN
  Administered 2020-09-18: 25 ug via INTRAVENOUS

## 2020-09-18 MED ORDER — 0.9 % SODIUM CHLORIDE (POUR BTL) OPTIME
TOPICAL | Status: DC | PRN
  Administered 2020-09-18: 1000 mL

## 2020-09-18 MED ORDER — ONDANSETRON HCL 4 MG/2ML IJ SOLN
INTRAMUSCULAR | Status: DC | PRN
  Administered 2020-09-18: 4 mg via INTRAVENOUS

## 2020-09-18 MED ORDER — ROCURONIUM BROMIDE 10 MG/ML (PF) SYRINGE
PREFILLED_SYRINGE | INTRAVENOUS | Status: AC
  Filled 2020-09-18: qty 10

## 2020-09-18 MED ORDER — ACETAMINOPHEN 325 MG PO TABS: 325.0000 mg | ORAL_TABLET | Freq: Once | ORAL | Status: DC | PRN

## 2020-09-18 MED ORDER — DEXAMETHASONE SODIUM PHOSPHATE 10 MG/ML IJ SOLN
INTRAMUSCULAR | Status: AC
  Filled 2020-09-18: qty 1

## 2020-09-18 MED ORDER — FENTANYL CITRATE (PF) 100 MCG/2ML IJ SOLN
INTRAMUSCULAR | Status: DC | PRN
  Administered 2020-09-18 (×3): 50 ug via INTRAVENOUS
  Administered 2020-09-18: 25 ug via INTRAVENOUS

## 2020-09-18 MED ORDER — ROCURONIUM BROMIDE 100 MG/10ML IV SOLN
INTRAVENOUS | Status: DC | PRN
  Administered 2020-09-18 (×2): 10 mg via INTRAVENOUS
  Administered 2020-09-18: 60 mg via INTRAVENOUS

## 2020-09-18 MED ORDER — PROPOFOL 10 MG/ML IV BOLUS
INTRAVENOUS | Status: AC
  Filled 2020-09-18: qty 20

## 2020-09-18 MED ORDER — LIDOCAINE HCL (CARDIAC) PF 100 MG/5ML IV SOSY
PREFILLED_SYRINGE | INTRAVENOUS | Status: DC | PRN
  Administered 2020-09-18: 50 mg via INTRAVENOUS

## 2020-09-18 MED ORDER — MIDAZOLAM HCL 2 MG/2ML IJ SOLN
INTRAMUSCULAR | Status: AC
  Filled 2020-09-18: qty 2

## 2020-09-18 SURGICAL SUPPLY — 46 items
ADH SKN CLS APL DERMABOND .7 (GAUZE/BANDAGES/DRESSINGS) ×1
APL PRP STRL LF DISP 70% ISPRP (MISCELLANEOUS) ×1
APPLIER CLIP 5 13 M/L LIGAMAX5 (MISCELLANEOUS)
APPLIER CLIP ROT 10 11.4 M/L (STAPLE)
APR CLP MED LRG 11.4X10 (STAPLE)
APR CLP MED LRG 5 ANG JAW (MISCELLANEOUS)
BAG SPEC RTRVL 10 TROC 200 (ENDOMECHANICALS) ×1
CABLE HIGH FREQUENCY MONO STRZ (ELECTRODE) ×2 IMPLANT
CHLORAPREP W/TINT 26 (MISCELLANEOUS) ×2 IMPLANT
CHOLANGIOGRAM CATH TAUT (CATHETERS) ×2 IMPLANT
CLIP APPLIE 5 13 M/L LIGAMAX5 (MISCELLANEOUS) IMPLANT
CLIP APPLIE ROT 10 11.4 M/L (STAPLE) IMPLANT
COVER MAYO STAND STRL (DRAPES) ×2 IMPLANT
COVER SURGICAL LIGHT HANDLE (MISCELLANEOUS) ×2 IMPLANT
COVER WAND RF STERILE (DRAPES) IMPLANT
DECANTER SPIKE VIAL GLASS SM (MISCELLANEOUS) ×2 IMPLANT
DERMABOND ADVANCED (GAUZE/BANDAGES/DRESSINGS) ×1
DERMABOND ADVANCED .7 DNX12 (GAUZE/BANDAGES/DRESSINGS) ×1 IMPLANT
DRAIN CHANNEL 19F RND (DRAIN) ×2 IMPLANT
DRAPE C-ARM 42X120 X-RAY (DRAPES) ×2 IMPLANT
ELECT REM PT RETURN 15FT ADLT (MISCELLANEOUS) ×2 IMPLANT
EVACUATOR SILICONE 100CC (DRAIN) ×2 IMPLANT
GLOVE SURG SYN 7.5  E (GLOVE) ×2
GLOVE SURG SYN 7.5 E (GLOVE) ×1 IMPLANT
GOWN STRL REUS W/TWL XL LVL3 (GOWN DISPOSABLE) ×6 IMPLANT
HEMOSTAT SURGICEL 4X8 (HEMOSTASIS) IMPLANT
IV CATH 14GX2 1/4 (CATHETERS) ×2 IMPLANT
IV SET EXTENSION CATH 6 NF (IV SETS) ×2 IMPLANT
KIT BASIN OR (CUSTOM PROCEDURE TRAY) ×2 IMPLANT
KIT TURNOVER KIT A (KITS) IMPLANT
POUCH RETRIEVAL ECOSAC 10 (ENDOMECHANICALS) ×1 IMPLANT
POUCH RETRIEVAL ECOSAC 10MM (ENDOMECHANICALS) ×1
SCISSORS LAP 5X35 DISP (ENDOMECHANICALS) ×2 IMPLANT
SET IRRIG TUBING LAPAROSCOPIC (IRRIGATION / IRRIGATOR) ×2 IMPLANT
SET TUBE SMOKE EVAC HIGH FLOW (TUBING) ×2 IMPLANT
SLEEVE ADV FIXATION 5X100MM (TROCAR) ×4 IMPLANT
STOPCOCK 4 WAY LG BORE MALE ST (IV SETS) ×2 IMPLANT
SUT MNCRL AB 4-0 PS2 18 (SUTURE) ×2 IMPLANT
SYR 10ML ECCENTRIC (SYRINGE) ×2 IMPLANT
TOWEL OR 17X26 10 PK STRL BLUE (TOWEL DISPOSABLE) ×2 IMPLANT
TOWEL OR NON WOVEN STRL DISP B (DISPOSABLE) ×2 IMPLANT
TRAY LAPAROSCOPIC (CUSTOM PROCEDURE TRAY) ×2 IMPLANT
TROCAR ADV FIXATION 11X100MM (TROCAR) IMPLANT
TROCAR ADV FIXATION 5X100MM (TROCAR) ×2 IMPLANT
TROCAR XCEL BLUNT TIP 100MML (ENDOMECHANICALS) ×2 IMPLANT
TROCAR XCEL NON-BLD 11X100MML (ENDOMECHANICALS) ×2 IMPLANT

## 2020-09-18 NOTE — Anesthesia Preprocedure Evaluation (Addendum)
Anesthesia Evaluation  Patient identified by MRN, date of birth, ID band Patient awake    Reviewed: Allergy & Precautions, NPO status , Patient's Chart, lab work & pertinent test results  Airway Mallampati: I  TM Distance: >3 FB Neck ROM: Full    Dental  (+) Poor Dentition, Missing, Loose, Chipped, Dental Advisory Given   Pulmonary neg pulmonary ROS,     + decreased breath sounds      Cardiovascular hypertension, Pt. on medications and Pt. on home beta blockers + CAD and + CABG  + dysrhythmias Atrial Fibrillation  Rhythm:Irregular Rate:Normal     Neuro/Psych negative neurological ROS  negative psych ROS   GI/Hepatic negative GI ROS, Neg liver ROS,   Endo/Other  negative endocrine ROS  Renal/GU      Musculoskeletal negative musculoskeletal ROS (+)   Abdominal Normal abdominal exam  (+)   Peds  Hematology negative hematology ROS (+)   Anesthesia Other Findings   Reproductive/Obstetrics                            Anesthesia Physical Anesthesia Plan  ASA: III  Anesthesia Plan: General   Post-op Pain Management:    Induction: Intravenous, Rapid sequence and Cricoid pressure planned  PONV Risk Score and Plan: 3 and Ondansetron, Dexamethasone and Midazolam  Airway Management Planned: Oral ETT  Additional Equipment: None  Intra-op Plan:   Post-operative Plan: Extubation in OR  Informed Consent: I have reviewed the patients History and Physical, chart, labs and discussed the procedure including the risks, benefits and alternatives for the proposed anesthesia with the patient or authorized representative who has indicated his/her understanding and acceptance.     Dental advisory given  Plan Discussed with: CRNA  Anesthesia Plan Comments: (Echo:  Left ventricular ejection fraction, by  estimation, is 55 to 60%. The left  ventricle has normal function. Left ventricular  endocardial border not  optimally defined to evaluate regional wall motion. There is severe  asymmetric left ventricular hypertrophy. Left ventricular diastolic  parameters are indeterminate.  2. Right ventricular systolic function is normal. The right ventricular  size is mildly enlarged.  3. The mitral valve is abnormal. Mild mitral valve regurgitation.  4. The aortic valve is abnormal. Aortic valve regurgitation is not  visualized. Mild aortic valve sclerosis is present, with no evidence of  aortic valve stenosis.  5. The inferior vena cava is normal in size with greater than 50%  respiratory variability, suggesting right atrial pressure of 3 mmHg. )       Anesthesia Quick Evaluation

## 2020-09-18 NOTE — Op Note (Addendum)
09/18/2020  12:37 PM  PATIENT:  [name] Rice, 69 y.o., male, MRN: [id number]  PREOP DIAGNOSIS:  cholelithiasis  POSTOP DIAGNOSIS:   Infarcted gall bladder with cholelithiasis  PROCEDURE:   Procedure(s):   LAPAROSCOPIC PARTIAL CHOLECYSTECTOMY  SURGEON:   Ovidio Kin, M.D.  ASSISTANT:   C. White MD, and Royston Sinner, MD  ANESTHESIA:   general  Anesthesiologist: Lewie Loron, MD; Shelton Silvas, MD CRNA: Garth Bigness, CRNA; Epimenio Sarin, CRNA  General  ASA: 3 emergent  EBL:  75  ml  BLOOD ADMINISTERED: none  DRAINS: 19 F Blake drain  LOCAL MEDICATIONS USED:   30 cc of 1/4% marcaine  SPECIMEN:   Portion of gall bladder  COUNTS CORRECT:  YES  INDICATIONS FOR PROCEDURE:  [name] Rice is a 69 y.o. (DOB: Mar 15, 1951) white male whose primary care physician is [name], PA-C and comes for cholecystectomy.   He had an ERCP yesterday, which Dr. [name] removed CBD stones and debris.  He did a sphincterotomy.   The indications and risks of the gall bladder surgery were explained to the patient.  The risks include, but are not limited to, infection, bleeding, common bile duct injury and open surgery.  SURGERY:  The patient was taken to OR room #4 at [facility].  The abdomen was prepped with chloroprep.  The patient was on Zosyn prior to the beginning of the operation.   A time out was held and the surgical checklist run.   An infraumbilical incision was made into the abdominal cavity.  A 12 mm Hasson trocar was inserted into the abdominal cavity through the infraumbilical incision and secured with a 0 Vicryl suture.  Three additional trocars were inserted: a 10 mm trocar in the sub-xiphoid location, a 5 mm trocar in the right mid subcostal area, and a 5 mm trocar in the right lateral subcostal area.   The abdomen was explored and the liver, stomach, and bowel that could be seen were unremarkable.   The gall bladder was encased in omentum and fat  and was difficult to find.   I dissected down to the gall bladder dome, I entered the gall bladder and decompressed the gall bladder.   I grasped the gall bladder and rotated it cephalad.  Disssection was carried down to the gall bladder wall anteriorly, but the duodenum was stuck laterally and posteriorly to the gall bladder and it was obvious that I would not find the gall bladder/cystic junction because of the severe inflammation.   I then opened the anterior wall of the gall bladder and the entire mucosa of the gall bladder was infarcted.  The infarcted mucosa was removed.  It became obvious at that point that the patient would be best served with a subtotal cholecystectomy.  I removed the anterior wall of the gall bladder.  I cauterized the posterior wall of the gall bladder   The portions of the gall bladder that I removed waere placed in a Ecco Sac bag and delivered through the umbilicus.  The abdomen and gall bladder bed was irrigated with 2,000 cc saline.   I placed a 19 F drain in the gall bladder bed.   The trocars were then removed.  I infiltrated 30cc of 1/4% Marcaine into the incisions.  The umbilical port closed with a 0 Vicryl suture and the skin closed with 4-0 Monocryl.  The skin was painted with DermaBond.  The patient's sponge and needle count were correct.  The  patient was transported to the RR in good condition.    Both photos of residual gall bladder after subtotal cholecystectomy.  Ovidio Kin, MD, [facility] Surgery Pager: [phone] Office phone:  [phone]

## 2020-09-18 NOTE — Progress Notes (Signed)
Day of Surgery    ZO:[id number] x2 months, fatigue/weakness  Subjective:  Did well with ERCP yesterday.  Now for lap chole  Objective: Vital signs in last 24 hours: Temp:  [97.5 F (36.4 C)-98.1 F (36.7 C)] 97.5 F (36.4 C) (11/11 0958) Pulse Rate:  [73-87] 76 (11/11 0958) Resp:  [11-25] 18 (11/11 0958) BP: (92-138)/(43-91) 138/82 (11/11 0958) SpO2:  [94 %-100 %] 100 % (11/11 0958) Weight:  [106.6 kg-108.5 kg] 106.6 kg (11/11 0842) Last BM Date: 09/17/20 400 p.o. 3200 IV 1325 urine BM x2 Afebrile vital signs are stable CMP shows a potassium of 3.1, glucose 104, alk phos 655, AST 47, ALT 51, total bilirubin 1.1 WBC 9.1 H/H 12/39.9, platelets 397,000. Mag checked >>1.9  Intake/Output from previous day: 11/10 0701 - 11/11 0700 In: 2089.9 [I.V.:1992.8; IV Piggyback:97.1] Out: 0  Intake/Output this shift: No intake/output data recorded.  General appearance: alert, cooperative and no distress Resp: clear to auscultation bilaterally GI: soft , nontender, few BS, feels like stomach is not right since eating yesterday  Lab Results:  Recent Labs    09/17/20 0441 09/18/20 0439  WBC 9.1 5.8  HGB 12.0* 11.2*  HCT 39.9 37.9*  PLT 397 314    BMET Recent Labs    09/17/20 0441 09/17/20 1030 09/17/20 1256 09/18/20 0439  NA 135  --   --  138  K 3.1*   < > 3.7 4.7  CL 104  --   --  108  CO2 24  --   --  23  GLUCOSE 104*  --   --  150*  BUN 8  --   --  9  CREATININE 1.01  --   --  1.44*  CALCIUM 7.9*  --   --  8.0*   < > = values in this interval not displayed.   PT/INR Recent Labs    09/17/20 0441  LABPROT 15.2  INR 1.3*    Recent Labs  Lab 09/14/20 1240 09/15/20 0441 09/16/20 0506 09/17/20 0441 09/18/20 0439  AST 105* 61* 41 47* 26  ALT 78* 61* 51* 51* 40  ALKPHOS 796* 690* 592* 655* 543*  BILITOT 1.6* 1.6* 1.5* 1.1 0.7  PROT 6.9 5.9* 6.1* 6.2* 6.3*  ALBUMIN 2.5* 2.4* 2.3* 2.5* 2.4*     Lipase     Component Value Date/Time   LIPASE 40  09/18/2020 0439     Medications: . [MAR Hold] amLODipine  5 mg Oral QHS  . [MAR Hold] feeding supplement  1 Container Oral TID BM  . [MAR Hold] metoprolol tartrate  12.5 mg Oral Daily   . sodium chloride    . lactated ringers 50 mL/hr at 09/18/20 0851  . 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 Bobbe Medico. Patient will need cardiac clearance.   Appears his primary cardiologist is Dr. Einar Gip PAF-on Xarelto at home.   Currently on hold, last dose 09/14/2020 Hypertension Diarrhea - notes ongoing diarrhea x 2 months. Per GI  Choledocholithiasis  Cholelithiasis with likely Cholecystitis             - MRCP yesterday showed CBD measuring 18m w/ layering sludge in the common bile duct. Expect patient will require ERCP. Will defer to GI.              ERCP today by Dr. SFuller Plan- stones and debris found in CBD.  Successful sphincterotomy.  LFT's look good today  FEN: NPO pending GI recs. IVF ID: Zosyn 11/7>>day 3 DVT: SCDs only, home Xarelto on hold Follow-up: TBD  Plan: Lap chole today.  I discussed with the patient the indications and risks of gall bladder surgery.  The primary risks of gall bladder surgery include, but are not limited to, bleeding, infection, common bile duct injury, and open surgery.  There is also the risk that the patient may have continued symptoms after surgery.  We discussed the typical post-operative recovery course. I tried to answer the patient's questions.    LOS: 4 days    Shann Medal 09/18/2020 Please see Amion

## 2020-09-18 NOTE — Progress Notes (Signed)
PROGRESS NOTE  [name] Rice  WUJ:[id number] DOB: [birth year] DOA: 09/14/2020 PCP: [name] Rice, [name] W., PA-C   Brief Narrative: [name] ArgyleJames R Wilsonis a 69 y.o.malewith a history of CAD s/p 4v CABG 07/29/2020, HTN, PAF s/p MAZE on xarelto who presented to the ED 11/7 with diarrhea and abdominal pain. Work up demonstrated enlarged common bile duct (19mm) with layering sludge for which GI and surgery were consulted. ERCP performed 09/17/2020 with successful extraction of CBD stone and sludge. Lap chole planned 11/11.  Assessment & Plan: Principal Problem:   Acute cholecystitis Active Problems:   Atrial fibrillation (HCC)   Pure hypercholesterolemia   Hypokalemia   CAD, multiple vessel   AKI (acute kidney injury) (HCC)   Sepsis (HCC)   Abdominal pain   Abnormal CT of the abdomen   Abnormal liver enzymes   Choledocholithiasis with acute cholecystitis   Abnormal magnetic resonance cholangiopancreatography (MRCP)   Common bile duct (CBD) obstruction   Elevated LFTs  Choledocholithiasis: s/p removal of stones and sludge by biliary sphincterotomy and balloon extraction at ERCP 11/10.  - Follow up pathology results from biopsy of abnormal mucosa at ampullary orifice  - Avoid aspirin, NSAIDs x1 week.  - Follow LFTs - Ok per GI to restart xarelto 11/13, will need surgery clearance in this regard prior to restarting.                 Cholelithiasis, suspected acute cholecystitis:  - Laparoscopic cholecystectomy tentatively planned by surgery 09/18/2020.   - Continued zosyn, may need to change to CTX/flagyl if Cr rises further.   Diarrhea: Unclear etiology.  - CDiff and GI pathogen panel.  - Follow with GI.            Hypokalemia:  - Stop supplementation with rise 3.1 > 4.7/24 hrs. Mg 1.7. Recheck.  AKI: Post-ERCP.  - Check in AM, continue hydration, avoid hypotension and nephrotoxins.  Persistent atrial fibrillation s/p biatrial MAZE, LAA clipping:  - Holding xarelto. Continue BB, rate is  controlled. Continuing telemetry due to AFib and hx NSVT.  CAD s/p CABG x4 September 2021, LBBB, HTN, HLD: No angina or heart failure symptoms.  - Continue metoprolol, statin, norvasc - Restart aspirin in 1 week per GI recommendations.  - Per cardiology, pt's CV risk is low-moderate.   Left carotid stenosis:  - Outpatient follow up recommended.   LFT elevation:  - Monitoring. Improved.  DVT prophylaxis: SCDs Code Status: Full Family Communication: None at bedside Disposition Plan:  Status is: Inpatient  Remains inpatient appropriate because:Ongoing diagnostic testing needed not appropriate for outpatient work up and Inpatient level of care appropriate due to severity of illness  Dispo: The patient is from: Home              Anticipated d/c is to: Home              Anticipated d/c date is: 2 days pending post-operative course, general surgery recommendations.              Patient currently is not medically stable to d/c.  Consultants:   GI  General surgery  Procedures:   ERCP 09/17/2020 Dr. Russella Rice:  Impression:       - Bulging ampulla which decompressed post                            sphinterotomy and CBD clearing.                           -  The common bile duct was dilated, with sludge and                            stones causing an obstruction.                           - Choledocholithiasis and a large volume of sludge                            was found in the CBD. Complete removal was                            accomplished by biliary sphincterotomy and balloon                            extraction.                           - A biliary sphincterotomy was performed.                           - Area of abnormal mucosa at the ampulla orifice,                            along the sphincterotomy margins was evident after                            sphincterotomy. Biopsied. Recommendation: Avoid aspirin and nonsteroidal anti-inflammatory                             medicines for 1 week.                           - Observe patient's clinical course following                            today's ERCP with therapeutic intervention.                           - Trend LFTs.                           - Await path results.                           - Pending pathology results and clinical course                            further evaluation with EUS may be indicated.                           - Proceed with cholecystectomy per general surgery.                           - OK to resume Xarelto in 3 days as indicated  however discuss timing with general surgery                            regarding scheduling of cholecystectomy.  Antimicrobials:  Zosyn   Subjective: In high spirits this morning preop, no abd pain, had solid stool last night without bleeding. No N/V. Eager to go home ASAP. No chest pain, palpitations, leg swelling.   Objective: Vitals:   09/17/20 2209 09/18/20 0449 09/18/20 0453 09/18/20 0842  BP: (!) 123/91 104/72    Pulse: 73 78    Resp: 18     Temp: (!) 97.5 F (36.4 C) 97.6 F (36.4 C)    TempSrc:  Axillary    SpO2: 97%     Weight:   108.5 kg 106.6 kg  Height:    6\' 2"  (1.88 m)    Intake/Output Summary (Last 24 hours) at 09/18/2020 0943 Last data filed at 09/18/2020 0830 Gross per 24 hour  Intake 2089.89 ml  Output 0 ml  Net 2089.89 ml   Filed Weights   09/17/20 0440 09/18/20 0453 09/18/20 0842  Weight: 105.8 kg 108.5 kg 106.6 kg   Gen: 69 y.o. male in no distress Pulm: Nonlabored breathing room air. Clear. CV: Irreg irreg. No murmur, rub, or gallop. No JVD, no dependent edema. GI: Abdomen soft, non-tender, non-distended, with normoactive bowel sounds.  Ext: Warm, no deformities Skin: No rashes, lesions or ulcers on visualized skin. Neuro: Alert and oriented. No focal neurological deficits. Psych: Judgement and insight appear fair. Mood euthymic & affect congruent. Behavior is appropriate.     Data Reviewed: I have personally reviewed following labs and imaging studies  CBC: Recent Labs  Lab 09/14/20 1240 09/15/20 0441 09/17/20 0441 09/18/20 0439  WBC 13.4* 10.4 9.1 5.8  NEUTROABS  --  7.9*  --   --   HGB 13.1 11.4* 12.0* 11.2*  HCT 42.5 38.6* 39.9 37.9*  MCV 91.2 95.8 94.3 95.9  PLT 486* 376 397 314   Basic Metabolic Panel: Recent Labs  Lab 09/14/20 1240 09/14/20 1240 09/15/20 0006 09/15/20 0441 09/15/20 0441 09/16/20 0506 09/17/20 0441 09/17/20 1030 09/17/20 1256 09/18/20 0439  NA 136  --   --  137  --  140 135  --   --  138  K 3.1*   < >  --  3.4*   < > 3.4* 3.1* 3.3* 3.7 4.7  CL 97*  --   --  103  --  106 104  --   --  108  CO2 23  --   --  22  --  22 24  --   --  23  GLUCOSE 103*  --   --  79  --  67* 104*  --   --  150*  BUN 15  --   --  16  --  11 8  --   --  9  CREATININE 1.36*  --   --  1.02  --  1.11 1.01  --   --  1.44*  CALCIUM 8.3*  --   --  7.8*  --  8.3* 7.9*  --   --  8.0*  MG  --   --  1.9 1.9  --   --  1.9  --   --  1.7   < > = values in this interval not displayed.   GFR: Estimated Creatinine Clearance: 64.8 mL/min (A) (by C-G formula based on SCr of 1.44  mg/dL (H)). Liver Function Tests: Recent Labs  Lab 09/14/20 1240 09/15/20 0441 09/16/20 0506 09/17/20 0441 09/18/20 0439  AST 105* 61* 41 47* 26  ALT 78* 61* 51* 51* 40  ALKPHOS 796* 690* 592* 655* 543*  BILITOT 1.6* 1.6* 1.5* 1.1 0.7  PROT 6.9 5.9* 6.1* 6.2* 6.3*  ALBUMIN 2.5* 2.4* 2.3* 2.5* 2.4*   Recent Labs  Lab 09/14/20 1240 09/18/20 0439  LIPASE 36 40   No results for input(s): AMMONIA in the last 168 hours. Coagulation Profile: Recent Labs  Lab 09/15/20 0441 09/17/20 0441  INR 1.7* 1.3*   Cardiac Enzymes: No results for input(s): CKTOTAL, CKMB, CKMBINDEX, TROPONINI in the last 168 hours. BNP (last 3 results) No results for input(s): PROBNP in the last 8760 hours. HbA1C: No results for input(s): HGBA1C in the last 72 hours. CBG: No results for  input(s): GLUCAP in the last 168 hours. Lipid Profile: No results for input(s): CHOL, HDL, LDLCALC, TRIG, CHOLHDL, LDLDIRECT in the last 72 hours. Thyroid Function Tests: Recent Labs    09/16/20 0506  TSH 1.083   Anemia Panel: No results for input(s): VITAMINB12, FOLATE, FERRITIN, TIBC, IRON, RETICCTPCT in the last 72 hours. Urine analysis:    Component Value Date/Time   COLORURINE BROWN (A) 09/14/2020 1720   APPEARANCEUR CLOUDY (A) 09/14/2020 1720   LABSPEC <1.005 (L) 09/14/2020 1720   PHURINE 5.5 09/14/2020 1720   GLUCOSEU NEGATIVE 09/14/2020 1720   HGBUR LARGE (A) 09/14/2020 1720   BILIRUBINUR MODERATE (A) 09/14/2020 1720   KETONESUR 15 (A) 09/14/2020 1720   PROTEINUR 30 (A) 09/14/2020 1720   NITRITE NEGATIVE 09/14/2020 1720   LEUKOCYTESUR NEGATIVE 09/14/2020 1720   Recent Results (from the past 240 hour(s))  Culture, blood (routine x 2)     Status: None (Preliminary result)   Collection Time: 09/14/20 12:00 PM   Specimen: BLOOD RIGHT ARM  Result Value Ref Range Status   Specimen Description   Final    BLOOD RIGHT ARM Performed at [facility], [address] [place], [place] [address]    Special Requests   Final    BOTTLES DRAWN AEROBIC AND ANAEROBIC Blood Culture adequate volume Performed at [facility], [address] [place], [place] [address]    Culture   Final    NO GROWTH 4 DAYS Performed at [facility], [address] [place], [place] [address]    Report Status PENDING  Incomplete  C Difficile Quick Screen w PCR reflex     Status: None   Collection Time: 09/14/20  2:20 PM   Specimen: STOOL  Result Value Ref Range Status   C Diff antigen NEGATIVE NEGATIVE Final   C Diff toxin NEGATIVE NEGATIVE Final   C Diff interpretation No C. difficile detected.  Final    Comment: Performed at [facility], [address], [place], [place] [address]  Respiratory Panel by RT PCR (Flu A&B, Covid) - Nasopharyngeal Swab     Status:  None   Collection Time: 09/14/20  4:04 PM   Specimen: Nasopharyngeal Swab  Result Value Ref Range Status   SARS Coronavirus 2 by RT PCR NEGATIVE NEGATIVE Final    Comment: (NOTE) SARS-CoV-2 target nucleic acids are NOT DETECTED.  The SARS-CoV-2 RNA is generally detectable in upper respiratoy specimens during the acute phase of infection. The lowest concentration of SARS-CoV-2 viral copies this assay can detect is 131 copies/mL. A negative result does not preclude SARS-Cov-2 infection and  should not be used as the sole basis for treatment or other patient management decisions. A negative result may occur with  improper specimen collection/handling, submission of specimen other than nasopharyngeal swab, presence of viral mutation(s) within the areas targeted by this assay, and inadequate number of viral copies (<131 copies/mL). A negative result must be combined with clinical observations, patient history, and epidemiological information. The expected result is Negative.  Fact Sheet for Patients:  https://www.moore.com/  Fact Sheet for Healthcare Providers:  https://www.young.biz/  This test is no t yet approved or cleared by the [place] FDA and  has been authorized for detection and/or diagnosis of SARS-CoV-2 by FDA under an Emergency Use Authorization (EUA). This EUA will remain  in effect (meaning this test can be used) for the duration of the COVID-19 declaration under Section 564(b)(1) of the Act, 21 U.S.C. section 360bbb-3(b)(1), unless the authorization is terminated or revoked sooner.     Influenza A by PCR NEGATIVE NEGATIVE Final   Influenza B by PCR NEGATIVE NEGATIVE Final    Comment: (NOTE) The Xpert Xpress SARS-CoV-2/FLU/RSV assay is intended as an aid in  the diagnosis of influenza from Nasopharyngeal swab specimens and  should not be used as a sole basis for treatment. Nasal washings and  aspirates are unacceptable for  Xpert Xpress SARS-CoV-2/FLU/RSV  testing.  Fact Sheet for Patients: https://www.moore.com/  Fact Sheet for Healthcare Providers: https://www.young.biz/  This test is not yet approved or cleared by the [place] FDA and  has been authorized for detection and/or diagnosis of SARS-CoV-2 by  FDA under an Emergency Use Authorization (EUA). This EUA will remain  in effect (meaning this test can be used) for the duration of the  Covid-19 declaration under Section 564(b)(1) of the Act, 21  U.S.C. section 360bbb-3(b)(1), unless the authorization is  terminated or revoked. Performed at [facility], [address] [place], [place] [address]       Radiology Studies: DG ERCP BILIARY & PANCREATIC DUCTS  Result Date: 09/17/2020 CLINICAL DATA:  69 year old male with a history elevated LFTs EXAM: ERCP TECHNIQUE: Multiple spot images obtained with the fluoroscopic device and submitted for interpretation post-procedure. FLUOROSCOPY TIME:  Fluoroscopy Time:  3 minutes 54 seconds COMPARISON:  MR 09/15/2020 FINDINGS: Limited intraoperative fluoroscopic spot images during ERCP. Initial image demonstrates the endoscope projecting over the upper abdomen. Subsequently there is cannulation of the ampulla with a safety wire and retrograde infusion of contrast. Vague filling defects in the distal common bile duct. Deployment of a retrieval balloon. IMPRESSION: Limited images during ERCP demonstrates partial opacification of the extrahepatic biliary ducts with deployment of a retrieval balloon. Please refer to the dictated operative report for full details of intraoperative findings and procedure. Electronically Signed   By: Gilmer Mor D.O.   On: 09/17/2020 15:11    Scheduled Meds: . [MAR Hold] amLODipine  5 mg Oral QHS  . [MAR Hold] feeding supplement  1 Container Oral TID BM  . [MAR Hold] metoprolol tartrate  12.5 mg Oral Daily   Continuous Infusions: .  sodium chloride    . lactated ringers 50 mL/hr at 09/18/20 0851  . [MAR Hold] piperacillin-tazobactam (ZOSYN)  IV 12.5 mL/hr at 09/18/20 0639     LOS: 4 days   Time spent: 35 minutes.  Tyrone Nine, MD [facility] [email or web address] 09/18/2020, 9:43 AM

## 2020-09-18 NOTE — Transfer of Care (Signed)
Immediate Anesthesia Transfer of Care Note  Patient: [name] Rice  Procedure(s) Performed: LAPAROSCOPIC PARTIAL CHOLECYSTECTOMY (N/A Abdomen)  Patient Location: PACU  Anesthesia Type:General  Level of Consciousness: awake, alert , oriented and patient cooperative  Airway & Oxygen Therapy: Patient Spontanous Breathing and Patient connected to face mask oxygen  Post-op Assessment: Report given to RN and Post -op Vital signs reviewed and stable  Post vital signs: Reviewed and stable  Last Vitals:  Vitals Value Taken Time  BP 126/75 09/18/20 1246  Temp    Pulse 65 09/18/20 1253  Resp 24 09/18/20 1253  SpO2 100 % 09/18/20 1253  Vitals shown include unvalidated device data.  Last Pain:  Vitals:   09/18/20 0958  TempSrc: Oral  PainSc:       Patients Stated Pain Goal: 4 (09/18/20 4098)  Complications: No complications documented.

## 2020-09-18 NOTE — Progress Notes (Signed)
Pt transported to OR, stable, a/o. SRP, RN

## 2020-09-18 NOTE — Anesthesia Procedure Notes (Signed)
Procedure Name: Intubation Date/Time: 09/18/2020 10:36 AM Performed by: Garrel Ridgel, CRNA Pre-anesthesia Checklist: Patient identified, Emergency Drugs available, Suction available and Patient being monitored Patient Re-evaluated:Patient Re-evaluated prior to induction Oxygen Delivery Method: Circle system utilized Preoxygenation: Pre-oxygenation with 100% oxygen Induction Type: IV induction Ventilation: Mask ventilation without difficulty Laryngoscope Size: Mac and 4 Tube type: Oral Tube size: 7.5 mm Number of attempts: 1 Airway Equipment and Method: Stylet and Oral airway Placement Confirmation: ETT inserted through vocal cords under direct vision,  positive ETCO2 and breath sounds checked- equal and bilateral Secured at: 23 cm Tube secured with: Tape Dental Injury: Teeth and Oropharynx as per pre-operative assessment

## 2020-09-18 NOTE — Progress Notes (Signed)
Pharmacy Antibiotic Note  [name] Rice is a 69 y.o. male presented to the ED on 09/14/2020 with c/o diarrhea for the past two months.  Abdominal CT on 11/7 showed findings consistent with acute cholecystitis. Pharmacy is consulted to start zosyn for intra-abdominal infection.  Plan: Day 5 Zosyn 3.375 gm IV q8h (infuse over 4 hrs) Lap cholecystectomy today 11/11  _____________________________________  Height: 6\' 2"  (188 cm) Weight: 106.6 kg (235 lb) IBW/kg (Calculated) : 82.2  Temp (24hrs), Avg:97.7 F (36.5 C), Min:97.5 F (36.4 C), Max:98.1 F (36.7 C)  Recent Labs  Lab 09/14/20 1240 09/14/20 1725 09/15/20 0441 09/16/20 0506 09/17/20 0441 09/18/20 0439  WBC 13.4*  --  10.4  --  9.1 5.8  CREATININE 1.36*  --  1.02 1.11 1.01 1.44*  LATICACIDVEN  --  1.3  --   --   --   --     Estimated Creatinine Clearance: 64.8 mL/min (A) (by C-G formula based on SCr of 1.44 mg/dL (H)).    No Known Allergies   Thank you for allowing pharmacy to be a part of this patient's care.  13/11/21 PharmD [facility] [phone] 09/18/2020 10:32 AM

## 2020-09-18 NOTE — Progress Notes (Addendum)
Received pt from PACU, incisional sites intact, JP drain charged, 15cc sanguinous drainage emptied and noted. Dressing around JP site dry and intact. Pt alert, denies pain. IS @ bedside and encouraged to use as instructed. Due to void post op. SRP, RN

## 2020-09-19 ENCOUNTER — Encounter (HOSPITAL_COMMUNITY): Payer: Self-pay | Admitting: Surgery

## 2020-09-19 DIAGNOSIS — R1011 Right upper quadrant pain: Secondary | ICD-10-CM

## 2020-09-19 DIAGNOSIS — K810 Acute cholecystitis: Secondary | ICD-10-CM | POA: Diagnosis not present

## 2020-09-19 DIAGNOSIS — K831 Obstruction of bile duct: Secondary | ICD-10-CM | POA: Diagnosis not present

## 2020-09-19 DIAGNOSIS — R748 Abnormal levels of other serum enzymes: Secondary | ICD-10-CM | POA: Diagnosis not present

## 2020-09-19 DIAGNOSIS — R933 Abnormal findings on diagnostic imaging of other parts of digestive tract: Secondary | ICD-10-CM | POA: Diagnosis not present

## 2020-09-19 DIAGNOSIS — I10 Essential (primary) hypertension: Secondary | ICD-10-CM

## 2020-09-19 LAB — CULTURE, BLOOD (ROUTINE X 2)
Culture: NO GROWTH
Special Requests: ADEQUATE

## 2020-09-19 LAB — COMPREHENSIVE METABOLIC PANEL
ALT: 43 U/L (ref 0–44)
AST: 34 U/L (ref 15–41)
Albumin: 2.6 g/dL — ABNORMAL LOW (ref 3.5–5.0)
Alkaline Phosphatase: 451 U/L — ABNORMAL HIGH (ref 38–126)
Anion gap: 11 (ref 5–15)
BUN: 10 mg/dL (ref 8–23)
CO2: 20 mmol/L — ABNORMAL LOW (ref 22–32)
Calcium: 8.2 mg/dL — ABNORMAL LOW (ref 8.9–10.3)
Chloride: 106 mmol/L (ref 98–111)
Creatinine, Ser: 1.23 mg/dL (ref 0.61–1.24)
GFR, Estimated: >60 mL/min (ref >60)
Glucose, Bld: 101 mg/dL — ABNORMAL HIGH (ref 70–99)
Potassium: 4.4 mmol/L (ref 3.5–5.1)
Sodium: 137 mmol/L (ref 135–145)
Total Bilirubin: 1.3 mg/dL — ABNORMAL HIGH (ref 0.3–1.2)
Total Protein: 6.5 g/dL (ref 6.5–8.1)

## 2020-09-19 LAB — CBC
HCT: 39.8 % (ref 39.0–52.0)
Hemoglobin: 11.4 g/dL — ABNORMAL LOW (ref 13.0–17.0)
MCH: 28.1 pg (ref 26.0–34.0)
MCHC: 28.6 g/dL — ABNORMAL LOW (ref 30.0–36.0)
MCV: 98 fL (ref 80.0–100.0)
Platelets: 361 10*3/uL (ref 150–400)
RBC: 4.06 MIL/uL — ABNORMAL LOW (ref 4.22–5.81)
RDW: 17.5 % — ABNORMAL HIGH (ref 11.5–15.5)
WBC: 17.1 10*3/uL — ABNORMAL HIGH (ref 4.0–10.5)
nRBC: 0 % (ref 0.0–0.2)

## 2020-09-19 LAB — SURGICAL PATHOLOGY

## 2020-09-19 MED ORDER — PNEUMOCOCCAL VAC POLYVALENT 25 MCG/0.5ML IJ INJ
0.5000 mL | INJECTION | INTRAMUSCULAR | Status: DC
  Filled 2020-09-19: qty 0.5

## 2020-09-19 NOTE — Evaluation (Signed)
Occupational Therapy Evaluation Patient Details Name: [name] Rice MRN: [id number] DOB: [birth year] Today's Date: 09/19/2020    History of Present Illness [name] Rice is a 69 y.o. male with a history of CAD s/p 4v CABG 07/29/2020, HTN, PAF s/p MAZE on xarelto who presented to the ED 11/7 with diarrhea and abdominal pain. Work up demonstrated enlarged common bile duct (36mm) with layering sludge for which GI and surgery were consulted. ERCP performed 09/17/2020 with successful extraction of CBD stone and sludge. Laparoscopic cholecystectomy was performed 11/11 by Dr. Ezzard Standing where gallbladder was found to be necrotic and severely inflammed requiring incomplete removal and placement of JP drain.   Clinical Impression   [name] Rice is a 69 year old man who demonstrates good ROM and strength of upper extremities, ability to perform ambulation managing IV pole,bed mobility and self care tasks. No physical assistance needed. Patient reports he is weaker than he normally is but no complaints of difficulty with tasks. Patient lives alone but as assistance of neighbors and friends as needed and his brother is a PT. No OT needs at this time.    Follow Up Recommendations  No OT follow up    Equipment Recommendations  None recommended by OT    Recommendations for Other Services       Precautions / Restrictions Precautions Precautions: None      Mobility Bed Mobility Overal bed mobility: Modified Independent                  Transfers Overall transfer level: Modified independent               General transfer comment: Ambulated in room managing IV pole without assistance.    Balance Overall balance assessment: No apparent balance deficits (not formally assessed)                                         ADL either performed or assessed with clinical judgement   ADL Overall ADL's : Modified independent                                        General ADL Comments: Demonstrates ability to don socks by using figure 4 position, ambulate to bathroom managing IV pole, perform toilet transfer using grab bar (has a sink he uses to push up on at home), and the physical abilities to perform all other ADLs.     Vision   Vision Assessment?: No apparent visual deficits     Perception     Praxis      Pertinent Vitals/Pain Pain Assessment: No/denies pain     Hand Dominance Right   Extremity/Trunk Assessment Upper Extremity Assessment Upper Extremity Assessment: Overall WFL for tasks assessed   Lower Extremity Assessment Lower Extremity Assessment: Defer to PT evaluation   Cervical / Trunk Assessment Cervical / Trunk Assessment: Normal   Communication Communication Communication: No difficulties   Cognition Arousal/Alertness: Awake/alert Behavior During Therapy: WFL for tasks assessed/performed Overall Cognitive Status: Within Functional Limits for tasks assessed                                     General Comments       Exercises  Shoulder Instructions      Home Living Family/patient expects to be discharged to:: Private residence Living Arrangements: Alone Available Help at Discharge: Friend(s);Available PRN/intermittently Type of Home: House Home Access: Stairs to enter [employer] of Steps: 2 Entrance Stairs-Rails: None Home Layout: One level     Bathroom Shower/Tub: IT trainer: Standard     Home Equipment: None          Prior Functioning/Environment Level of Independence: Independent        Comments: Prior to heart surgery independent and ambualted out in communicty. Went to rehab after heart surgery - and was independent. Limited by persistent diarhea that kept him weak and close to a bathroom.        OT Problem List:        OT Treatment/Interventions:      OT Goals(Current goals can be found in the care plan section)  Acute Rehab OT Goals Patient Stated Goal: to get strong enoough to leave home, and have no more diarrhea OT Goal Formulation: All assessment and education complete, DC therapy  OT Frequency:     Barriers to D/C:            Co-evaluation              AM-PAC OT "6 Clicks" Daily Activity     Outcome Measure Help from another person eating meals?: None Help from another person taking care of personal grooming?: None Help from another person toileting, which includes using toliet, bedpan, or urinal?: None Help from another person bathing (including washing, rinsing, drying)?: None Help from another person to put on and taking off regular upper body clothing?: None Help from another person to put on and taking off regular lower body clothing?: None 6 Click Score: 24   End of Session Nurse Communication:  (okay to see per RN)  Activity Tolerance: Patient tolerated treatment well Patient left: in chair;with call bell/phone within reach;with chair alarm set                   Time: 1410-1428 OT Time Calculation (min): 18 min Charges:  OT General Charges $OT Visit: 1 Visit OT Evaluation $OT Eval Low Complexity: 1 Low  Maciah Feeback, OTR/L Acute Care Rehab Services  Office [phone] Pager: [phone]   Kelli Churn 09/19/2020, 3:17 PM

## 2020-09-19 NOTE — Progress Notes (Signed)
PROGRESS NOTE  [name] Rice  WUJ:[id number] DOB: 10-23-1951 DOA: 09/14/2020 PCP: [name], PA-C   Brief Narrative: [name] Rice a 68 y.o.malewith a history of CAD s/p 4v CABG 07/29/2020, HTN, PAF s/p MAZE on xarelto who presented to the ED 11/7 with diarrhea and abdominal pain. Work up demonstrated enlarged common bile duct (19mm) with layering sludge for which GI and surgery were consulted. ERCP performed 09/17/2020 with successful extraction of CBD stone and sludge. Laparoscopic cholecystectomy was performed 11/11 by Dr. Ezzard Standing where gallbladder was found to be necrotic and severely inflammed requiring incomplete removal and placement of JP drain. Zosyn continues.  Assessment & Plan: Principal Problem:   Acute cholecystitis Active Problems:   Atrial fibrillation (HCC)   Pure hypercholesterolemia   Hypokalemia   CAD, multiple vessel   AKI (acute kidney injury) (HCC)   Sepsis (HCC)   Abdominal pain   Abnormal CT of the abdomen   Abnormal liver enzymes   Choledocholithiasis with acute cholecystitis   Abnormal magnetic resonance cholangiopancreatography (MRCP)   Common bile duct (CBD) obstruction   Elevated LFTs  Choledocholithiasis: s/p removal of stones and sludge by biliary sphincterotomy and balloon extraction at ERCP 11/10. Reactive changes without dysplasia or malignancy identified on pathology from biopsy of abnormal mucosa at ampullary orifice  - Avoid aspirin, NSAIDs x1 week.  - Follow LFTs - Ok per GI to restart xarelto 11/13, will need surgery clearance in this regard prior to restarting.                 Cholelithiasis, infarcted gallbladder s/p laparoscopic partial cholecystectomy 11/11:  - Laparoscopic cholecystectomy tentatively planned by surgery 09/18/2020.   - Continued zosyn    Diarrhea: Unclear etiology. CDiff negative. - Follow with GI.            Hypokalemia:  - Stopped standing supplementation with rise. Will continue to monitor.  AKI:  Post-ERCP. Now improving.   - Continue hydration, avoid hypotension and nephrotoxins.  Persistent atrial fibrillation s/p biatrial MAZE, LAA clipping:  - Holding xarelto. Continue BB, rate is controlled. Continuing telemetry due to AFib and hx NSVT.  CAD s/p CABG x4 September 2021, LBBB, HTN, HLD: No angina or heart failure symptoms.  - Continue metoprolol, statin, norvasc - Restart aspirin in 1 week per GI recommendations.  - Per cardiology, pt's CV risk is low-moderate.   Left carotid stenosis:  - Outpatient follow up recommended.   LFT elevation:  - Monitoring. Improved.  DVT prophylaxis: SCDs Code Status: Full Family Communication: None at bedside Disposition Plan:  Status is: Inpatient  Remains inpatient appropriate because:Ongoing diagnostic testing needed not appropriate for outpatient work up and Inpatient level of care appropriate due to severity of illness  Dispo: The patient is from: Home              Anticipated d/c is to: Home              Anticipated d/c date is: > 3 days pending post-operative course, general surgery recommendations.              Patient currently is not medically stable to d/c.  Consultants:   GI  General surgery  Procedures:   ERCP 09/17/2020 Dr. [name]:  Impression:       - Bulging ampulla which decompressed post                            sphinterotomy and CBD  clearing.                           - The common bile duct was dilated, with sludge and                            stones causing an obstruction.                           - Choledocholithiasis and a large volume of sludge                            was found in the CBD. Complete removal was                            accomplished by biliary sphincterotomy and balloon                            extraction.                           - A biliary sphincterotomy was performed.                           - Area of abnormal mucosa at the ampulla orifice,                             along the sphincterotomy margins was evident after                            sphincterotomy. Biopsied. Recommendation: Avoid aspirin and nonsteroidal anti-inflammatory                            medicines for 1 week.                           - Observe patient's clinical course following                            today's ERCP with therapeutic intervention.                           - Trend LFTs.                           - Await path results.                           - Pending pathology results and clinical course                            further evaluation with EUS may be indicated.                           - Proceed with cholecystectomy per general surgery.                           -  OK to resume Xarelto in 3 days as indicated                            however discuss timing with general surgery                            regarding scheduling of cholecystectomy.  Antimicrobials:  Zosyn   Subjective: Pain is controlled, mild-moderate in abdomen diffusely without nausea or vomiting. No current diarrhea. Taking po normally.   Objective: Vitals:   09/18/20 1742 09/18/20 2053 09/19/20 0450 09/19/20 0913  BP:  120/80 117/66 111/67  Pulse:  88 76 66  Resp:  17 16   Temp: (!) 97.4 F (36.3 C) 97.8 F (36.6 C) (!) 97.5 F (36.4 C)   TempSrc: Oral Oral Oral   SpO2:  98% 97%   Weight:   105.5 kg   Height:        Intake/Output Summary (Last 24 hours) at 09/19/2020 1036 Last data filed at 09/19/2020 0603 Gross per 24 hour  Intake 2640.35 ml  Output 705 ml  Net 1935.35 ml   Filed Weights   09/18/20 0453 09/18/20 0842 09/19/20 0450  Weight: 108.5 kg 106.6 kg 105.5 kg   Gen: 69 y.o. male in no distress Pulm: Nonlabored breathing room air. Clear. CV: Regular rate and rhythm. No murmur, rub, or gallop. No JVD, no pitting dependent edema. GI: Abdomen soft, mildly diffusely tender. Protuberant but non-distended, with normoactive bowel sounds.  Ext: Warm, no  deformities Skin: Laparoscopic sites with dermabond without erythema or dehiscence at this time, JP drain in RUQ c/d/i with serosanguinous discharge. No new rashes, lesions or ulcers on visualized skin. Neuro: Alert and oriented. No focal neurological deficits. Psych: Judgement and insight appear fair. Mood euthymic & affect congruent. Behavior is appropriate.    Data Reviewed: I have personally reviewed following labs and imaging studies  CBC: Recent Labs  Lab 09/14/20 1240 09/15/20 0441 09/17/20 0441 09/18/20 0439 09/19/20 0831  WBC 13.4* 10.4 9.1 5.8 17.1*  NEUTROABS  --  7.9*  --   --   --   HGB 13.1 11.4* 12.0* 11.2* 11.4*  HCT 42.5 38.6* 39.9 37.9* 39.8  MCV 91.2 95.8 94.3 95.9 98.0  PLT 486* 376 397 314 361   Basic Metabolic Panel: Recent Labs  Lab 09/14/20 1240 09/15/20 0006 09/15/20 0441 09/15/20 0441 09/16/20 0506 09/16/20 0506 09/17/20 0441 09/17/20 1030 09/17/20 1256 09/18/20 0439 09/19/20 0831  NA   < >  --  137  --  140  --  135  --   --  138 137  K   < >  --  3.4*   < > 3.4*   < > 3.1* 3.3* 3.7 4.7 4.4  CL   < >  --  103  --  106  --  104  --   --  108 106  CO2   < >  --  22  --  22  --  24  --   --  23 20*  GLUCOSE   < >  --  79  --  67*  --  104*  --   --  150* 101*  BUN   < >  --  16  --  11  --  8  --   --  9 10  CREATININE   < >  --  1.02  --  1.11  --  1.01  --   --  1.44* 1.23  CALCIUM   < >  --  7.8*  --  8.3*  --  7.9*  --   --  8.0* 8.2*  MG  --  1.9 1.9  --   --   --  1.9  --   --  1.7  --    < > = values in this interval not displayed.   GFR: Estimated Creatinine Clearance: 75.4 mL/min (by C-G formula based on SCr of 1.23 mg/dL). Liver Function Tests: Recent Labs  Lab 09/15/20 0441 09/16/20 0506 09/17/20 0441 09/18/20 0439 09/19/20 0831  AST 61* 41 47* 26 34  ALT 61* 51* 51* 40 43  ALKPHOS 690* 592* 655* 543* 451*  BILITOT 1.6* 1.5* 1.1 0.7 1.3*  PROT 5.9* 6.1* 6.2* 6.3* 6.5  ALBUMIN 2.4* 2.3* 2.5* 2.4* 2.6*   Recent Labs   Lab 09/14/20 1240 09/18/20 0439  LIPASE 36 40   No results for input(s): AMMONIA in the last 168 hours. Coagulation Profile: Recent Labs  Lab 09/15/20 0441 09/17/20 0441  INR 1.7* 1.3*   Urine analysis:    Component Value Date/Time   COLORURINE BROWN (A) 09/14/2020 1720   APPEARANCEUR CLOUDY (A) 09/14/2020 1720   LABSPEC <1.005 (L) 09/14/2020 1720   PHURINE 5.5 09/14/2020 1720   GLUCOSEU NEGATIVE 09/14/2020 1720   HGBUR LARGE (A) 09/14/2020 1720   BILIRUBINUR MODERATE (A) 09/14/2020 1720   KETONESUR 15 (A) 09/14/2020 1720   PROTEINUR 30 (A) 09/14/2020 1720   NITRITE NEGATIVE 09/14/2020 1720   LEUKOCYTESUR NEGATIVE 09/14/2020 1720   Recent Results (from the past 240 hour(s))  Culture, blood (routine x 2)     Status: None   Collection Time: 09/14/20 12:00 PM   Specimen: BLOOD RIGHT ARM  Result Value Ref Range Status   Specimen Description   Final    BLOOD RIGHT ARM Performed at [facility] [place], [address] [place], [place] [address]    Special Requests   Final    BOTTLES DRAWN AEROBIC AND ANAEROBIC Blood Culture adequate volume Performed at [facility] [place], [address] [place], [place] [address]    Culture   Final    NO GROWTH 5 DAYS Performed at [facility], [address] [place], [place] [address]    Report Status 09/19/2020 FINAL  Final  C Difficile Quick Screen w PCR reflex     Status: None   Collection Time: 09/14/20  2:20 PM   Specimen: STOOL  Result Value Ref Range Status   C Diff antigen NEGATIVE NEGATIVE Final   C Diff toxin NEGATIVE NEGATIVE Final   C Diff interpretation No C. difficile detected.  Final    Comment: Performed at [facility], [address] [place], [place] [address]  Respiratory Panel by RT PCR (Flu A&B, Covid) - Nasopharyngeal Swab     Status: None   Collection Time: 09/14/20  4:04 PM   Specimen: Nasopharyngeal Swab  Result Value Ref Range Status   SARS Coronavirus 2 by RT PCR NEGATIVE  NEGATIVE Final    Comment: (NOTE) SARS-CoV-2 target nucleic acids are NOT DETECTED.  The SARS-CoV-2 RNA is generally detectable in upper respiratoy specimens during the acute phase of infection. The lowest concentration of SARS-CoV-2 viral copies this assay can detect is 131 copies/mL. A negative result does not preclude SARS-Cov-2 infection and should not be used as the sole basis for  treatment or other patient management decisions. A negative result may occur with  improper specimen collection/handling, submission of specimen other than nasopharyngeal swab, presence of viral mutation(s) within the areas targeted by this assay, and inadequate number of viral copies (<131 copies/mL). A negative result must be combined with clinical observations, patient history, and epidemiological information. The expected result is Negative.  Fact Sheet for Patients:  https://www.moore.com/  Fact Sheet for Healthcare Providers:  https://www.young.biz/  This test is no t yet approved or cleared by the [place] FDA and  has been authorized for detection and/or diagnosis of SARS-CoV-2 by FDA under an Emergency Use Authorization (EUA). This EUA will remain  in effect (meaning this test can be used) for the duration of the COVID-19 declaration under Section 564(b)(1) of the Act, 21 U.S.C. section 360bbb-3(b)(1), unless the authorization is terminated or revoked sooner.     Influenza A by PCR NEGATIVE NEGATIVE Final   Influenza B by PCR NEGATIVE NEGATIVE Final    Comment: (NOTE) The Xpert Xpress SARS-CoV-2/FLU/RSV assay is intended as an aid in  the diagnosis of influenza from Nasopharyngeal swab specimens and  should not be used as a sole basis for treatment. Nasal washings and  aspirates are unacceptable for Xpert Xpress SARS-CoV-2/FLU/RSV  testing.  Fact Sheet for Patients: https://www.moore.com/  Fact Sheet for Healthcare  Providers: https://www.young.biz/  This test is not yet approved or cleared by the [place] FDA and  has been authorized for detection and/or diagnosis of SARS-CoV-2 by  FDA under an Emergency Use Authorization (EUA). This EUA will remain  in effect (meaning this test can be used) for the duration of the  Covid-19 declaration under Section 564(b)(1) of the Act, 21  U.S.C. section 360bbb-3(b)(1), unless the authorization is  terminated or revoked. Performed at [facility], [address] [place], [place] [address]       Radiology Studies: DG ERCP BILIARY & PANCREATIC DUCTS  Result Date: 09/17/2020 CLINICAL DATA:  69 year old male with a history elevated LFTs EXAM: ERCP TECHNIQUE: Multiple spot images obtained with the fluoroscopic device and submitted for interpretation post-procedure. FLUOROSCOPY TIME:  Fluoroscopy Time:  3 minutes 54 seconds COMPARISON:  MR 09/15/2020 FINDINGS: Limited intraoperative fluoroscopic spot images during ERCP. Initial image demonstrates the endoscope projecting over the upper abdomen. Subsequently there is cannulation of the ampulla with a safety wire and retrograde infusion of contrast. Vague filling defects in the distal common bile duct. Deployment of a retrieval balloon. IMPRESSION: Limited images during ERCP demonstrates partial opacification of the extrahepatic biliary ducts with deployment of a retrieval balloon. Please refer to the dictated operative report for full details of intraoperative findings and procedure. Electronically Signed   By: Gilmer Mor D.O.   On: 09/17/2020 15:11    Scheduled Meds: . amLODipine  5 mg Oral QHS  . feeding supplement  1 Container Oral TID BM  . metoprolol tartrate  12.5 mg Oral Daily   Continuous Infusions: . sodium chloride 100 mL/hr at 09/18/20 2211  . piperacillin-tazobactam (ZOSYN)  IV 3.375 g (09/19/20 0603)     LOS: 5 days   Time spent: 35 minutes.  Tyrone Nine,  MD [facility] [email or web address] 09/19/2020, 10:36 AM

## 2020-09-19 NOTE — Anesthesia Postprocedure Evaluation (Signed)
Anesthesia Post Note  Patient: [name] Rice  Procedure(s) Performed: ENDOSCOPIC RETROGRADE CHOLANGIOPANCREATOGRAPHY (ERCP) (N/A ) SPHINCTEROTOMY REMOVAL OF STONES     Patient location during evaluation: Endoscopy Anesthesia Type: General Level of consciousness: awake Pain management: pain level controlled Vital Signs Assessment: post-procedure vital signs reviewed and stable Respiratory status: spontaneous breathing Cardiovascular status: stable Postop Assessment: no apparent nausea or vomiting Anesthetic complications: no   No complications documented.  Last Vitals:  Vitals:   09/19/20 0913 09/19/20 1200  BP: 111/67 103/76  Pulse: 66 71  Resp:    Temp:  36.5 C  SpO2:  95%    Last Pain:  Vitals:   09/19/20 1200  TempSrc: Oral  PainSc:    Pain Goal: Patients Stated Pain Goal: 0 (09/18/20 2213)                 [name]

## 2020-09-19 NOTE — Evaluation (Signed)
Physical Therapy Evaluation Patient Details Name: [name] Rice MRN: [id number] DOB: Jan 26, 1951 Today's Date: 09/19/2020   History of Present Illness  [name] Rice is a 69 y.o. male with a history of CAD s/p 4v CABG 07/29/2020, HTN, PAF s/p MAZE on xarelto who presented to the ED 11/7 with diarrhea and abdominal pain. Work up demonstrated enlarged common bile duct (26mm) with layering sludge for which GI and surgery were consulted. ERCP performed 09/17/2020 with successful extraction of CBD stone and sludge. Laparoscopic cholecystectomy was performed 11/11 by Dr. Ezzard Standing where gallbladder was found to be necrotic and severely inflammed requiring incomplete removal and placement of JP drain.  Clinical Impression  Pt admitted with above diagnosis.  Pt currently with functional limitations due to the deficits listed below (see PT Problem List). Pt will benefit from skilled PT to increase their independence and safety with mobility to allow discharge to the venue listed below.  Pt reports his brother is PT and he has been to his house and assisted with safety upgrades.  Pt typically ambulatory without assistive device and only used RW for comfort today.  Pt encouraged to ambulate with staff during acute stay in preparation for d/c home.     Follow Up Recommendations No PT follow up    Equipment Recommendations  Rolling walker with 5" wheels (may need RW but likely with progress to no needs)    Recommendations for Other Services       Precautions / Restrictions Precautions Precautions: None      Mobility  Bed Mobility Overal bed mobility: Modified Independent                  Transfers Overall transfer level: Modified independent               General transfer comment: Ambulated in room managing IV pole without assistance.  Ambulation/Gait Ambulation/Gait assistance: Supervision;Min guard Gait Distance (Feet): 120 Feet Assistive device: Rolling walker (2 wheeled) Gait  Pattern/deviations: Step-through pattern;Decreased stride length     General Gait Details: performs well with RW, requested use of RW and gait belt for safety since he hasn't walked since Sunday  Stairs            Wheelchair Mobility    Modified Rankin (Stroke Patients Only)       Balance Overall balance assessment: No apparent balance deficits (not formally assessed)                                           Pertinent Vitals/Pain Pain Assessment: No/denies pain    Home Living Family/patient expects to be discharged to:: Private residence Living Arrangements: Alone Available Help at Discharge: Friend(s);Available PRN/intermittently Type of Home: House Home Access: Stairs to enter Entrance Stairs-Rails: None Entrance Stairs-Number of Steps: 2 Home Layout: One level Home Equipment: None      Prior Function Level of Independence: Independent         Comments: Prior to heart surgery independent and ambulated in community. Went to rehab after heart surgery - and was independent. Limited by persistent diarrhea that kept him weak and close to a bathroom.     Hand Dominance   Dominant Hand: Right    Extremity/Trunk Assessment   Upper Extremity Assessment Upper Extremity Assessment: Overall WFL for tasks assessed    Lower Extremity Assessment Lower Extremity Assessment: Overall WFL for tasks assessed  Cervical / Trunk Assessment Cervical / Trunk Assessment: Normal  Communication   Communication: No difficulties  Cognition Arousal/Alertness: Awake/alert Behavior During Therapy: WFL for tasks assessed/performed Overall Cognitive Status: Within Functional Limits for tasks assessed                                        General Comments      Exercises     Assessment/Plan    PT Assessment Patient needs continued PT services  PT Problem List Decreased mobility;Decreased activity tolerance;Decreased knowledge of use  of DME       PT Treatment Interventions Gait training;DME instruction;Balance training;Functional mobility training;Therapeutic activities;Patient/family education;Stair training;Therapeutic exercise    PT Goals (Current goals can be found in the Care Plan section)  Acute Rehab PT Goals Patient Stated Goal: to get strong enoough to leave home, and have no more diarrhea PT Goal Formulation: With patient Time For Goal Achievement: 10/03/20 Potential to Achieve Goals: Good    Frequency Min 3X/week   Barriers to discharge        Co-evaluation               AM-PAC PT "6 Clicks" Mobility  Outcome Measure Help needed turning from your back to your side while in a flat bed without using bedrails?: None Help needed moving from lying on your back to sitting on the side of a flat bed without using bedrails?: None Help needed moving to and from a bed to a chair (including a wheelchair)?: A Little Help needed standing up from a chair using your arms (e.g., wheelchair or bedside chair)?: A Little Help needed to walk in hospital room?: A Little Help needed climbing 3-5 steps with a railing? : A Little 6 Click Score: 20    End of Session Equipment Utilized During Treatment: Gait belt Activity Tolerance: Patient tolerated treatment well Patient left: in bed;with call bell/phone within reach   PT Visit Diagnosis: Difficulty in walking, not elsewhere classified (R26.2)    Time: 5329-9242 PT Time Calculation (min) (ACUTE ONLY): 12 min   Charges:   PT Evaluation $PT Eval Low Complexity: 1 Low         Kati PT, DPT Acute Rehabilitation Services Pager: [phone] Office: [phone]  Sarajane Jews 09/19/2020, 4:47 PM

## 2020-09-19 NOTE — Progress Notes (Signed)
While rounding on the unit Chaplain spoke with several nurses and received a request to visit with this patient who "needs a friend."  Chaplain was welcomed by the patient.  Chaplain provided a ministry of presence while patient described his journey to being here today.  Patient described his close relationship with his brother who is in the medical profession.  Patient recounted how is doctor said it is a miracle patient is still walking around since he had 5 heart blockages.  And a gall bladder that was so tattered it should have killed him. Chaplain supported patient's belief he has been blessed by God.    Vernell Morgans Chaplain

## 2020-09-19 NOTE — Progress Notes (Signed)
1 Day Post-Op   Subjective:  Doing well with minimal abdominal discomfort  Taking po's well.  Objective: Vital signs in last 24 hours: Temp:  [97.3 F (36.3 C)-97.8 F (36.6 C)] 97.5 F (36.4 C) (11/12 0450) Pulse Rate:  [64-88] 76 (11/12 0450) Resp:  [16-22] 16 (11/12 0450) BP: (117-138)/(66-83) 117/66 (11/12 0450) SpO2:  [96 %-100 %] 97 % (11/12 0450) Weight:  [105.5 kg-106.6 kg] 105.5 kg (11/12 0450) Last BM Date: 09/17/20 360 p.o. recorded 2300 IV 550 urine 125 drain BM x1 Afebrile vital signs are stable WBC 17.1 this a.m.  CMP is pending     Intake/Output from previous day: 11/11 0701 - 11/12 0700 In: 2687 [P.O.:360; I.V.:2277.2; IV Piggyback:49.8] Out: 705 [Urine:550; Drains:125; Blood:30] Intake/Output this shift: No intake/output data recorded.  General: Older heavy WM who is alert and in good spirits. HEENT: Normal. Pupils equal.  Lungs: Clear to auscultation and symmetric breath sounds. Heart:  RRR. No murmur or rub. Abdomen: Soft. No mass. Incisions look good.  Drain in RUQ - thin bloody serous fluid.   125 cc recorded last 24 hours. Extremities:  Good strength and ROM  in upper and lower extremities.  Lab Results:  Recent Labs    09/18/20 0439 09/19/20 0831  WBC 5.8 17.1*  HGB 11.2* 11.4*  HCT 37.9* 39.8  PLT 314 361    BMET Recent Labs    09/17/20 0441 09/17/20 1030 09/17/20 1256 09/18/20 0439  NA 135  --   --  138  K 3.1*   < > 3.7 4.7  CL 104  --   --  108  CO2 24  --   --  23  GLUCOSE 104*  --   --  150*  BUN 8  --   --  9  CREATININE 1.01  --   --  1.44*  CALCIUM 7.9*  --   --  8.0*   < > = values in this interval not displayed.   PT/INR Recent Labs    09/17/20 0441  LABPROT 15.2  INR 1.3*    Recent Labs  Lab 09/14/20 1240 09/15/20 0441 09/16/20 0506 09/17/20 0441 09/18/20 0439  AST 105* 61* 41 47* 26  ALT 78* 61* 51* 51* 40  ALKPHOS 796* 690* 592* 655* 543*  BILITOT 1.6* 1.6* 1.5* 1.1 0.7  PROT 6.9 5.9* 6.1*  6.2* 6.3*  ALBUMIN 2.5* 2.4* 2.3* 2.5* 2.4*     Lipase     Component Value Date/Time   LIPASE 40 09/18/2020 0439     Medications: . amLODipine  5 mg Oral QHS  . feeding supplement  1 Container Oral TID BM  . metoprolol tartrate  12.5 mg Oral Daily   . sodium chloride 100 mL/hr at 09/18/20 2211  . piperacillin-tazobactam (ZOSYN)  IV 3.375 g (09/19/20 0603)    Assessment/Plan CAD- s/pCABGx 49/21/2021byDr. Z Atkins. Patient will need cardiac clearance.              Appears his primary cardiologist is Dr. Noemi Chapel home.              Currently on hold,last dose 09/14/2020 Hypertension Diarrhea- notes ongoing diarrhea x2 months. Per GI  Infarcted gallbladder with cholelithiasis Choledocholithiasis  S/p ERCP/sphincterotomy/balloon extraction biliary stones 09/17/2020 Dr. Claudette Head  S/p laparoscopic partial cholecystectomy/drain placement 09/18/2020 Dr. Ovidio Kin, POD #1  WBC - 17,100 - 09/19/2020  LFT's okay  FEN: IV fluids/clear liquids  To advance to reg food ID: Zosyn 11/7 >>  day 6 (needs 7-10 days postop antibiotics) DVT: Can restart Lovenox later today Follow-up: Dr. Ezzard Standing  Plan: Advance diet  Recheck CBC tomorrow  Follow drain output  Ambulate    LOS: 5 days   JENNINGS,WILLARD 09/19/2020 Please see Amion  Agree with above. Looks better than I would have expected.   Ovidio Kin, MD, [facility] Office phone:  [phone]

## 2020-09-19 NOTE — Anesthesia Postprocedure Evaluation (Signed)
Anesthesia Post Note  Patient: [name] Rice  Procedure(s) Performed: LAPAROSCOPIC PARTIAL CHOLECYSTECTOMY (N/A Abdomen)     Patient location during evaluation: PACU Anesthesia Type: General Level of consciousness: sedated and patient cooperative Pain management: pain level controlled Vital Signs Assessment: post-procedure vital signs reviewed and stable Respiratory status: spontaneous breathing Cardiovascular status: stable Anesthetic complications: no   No complications documented.  Last Vitals:  Vitals:   09/19/20 0913 09/19/20 1200  BP: 111/67 103/76  Pulse: 66 71  Resp:    Temp:  36.5 C  SpO2:  95%    Last Pain:  Vitals:   09/19/20 1200  TempSrc: Oral  PainSc:                  Lewie Loron

## 2020-09-19 NOTE — Plan of Care (Signed)
  Problem: Clinical Measurements: Goal: Will remain free from infection Outcome: Progressing Goal: Diagnostic test results will improve Outcome: Progressing   Problem: Activity: Goal: Risk for activity intolerance will decrease Outcome: Progressing   Problem: Nutrition: Goal: Adequate nutrition will be maintained Outcome: Progressing   Problem: Elimination: Goal: Will not experience complications related to bowel motility Outcome: Progressing   Problem: Safety: Goal: Ability to remain free from injury will improve Outcome: Progressing   Problem: Skin Integrity: Goal: Risk for impaired skin integrity will decrease Outcome: Progressing   Problem: Clinical Measurements: Goal: Diagnostic test results will improve Outcome: Progressing Goal: Cardiovascular complication will be avoided Outcome: Progressing   Problem: Activity: Goal: Risk for activity intolerance will decrease Outcome: Progressing

## 2020-09-19 NOTE — Progress Notes (Signed)
Progress Note   Subjective  Chief Complaint: Elevated LFTs, status post ERCP with sphincterotomy and partial cholecystectomy  Patient doing well after his partial cholecystectomy yesterday (see op note, procedure was complicated).  Tells me that he is really having no abdominal pain and does have an appetite.   Objective   Vital signs in last 24 hours: Temp:  [97.3 F (36.3 C)-97.8 F (36.6 C)] 97.5 F (36.4 C) (11/12 0450) Pulse Rate:  [64-88] 66 (11/12 0913) Resp:  [16-22] 16 (11/12 0450) BP: (111-129)/(66-83) 111/67 (11/12 0913) SpO2:  [96 %-100 %] 97 % (11/12 0450) Weight:  [105.5 kg] 105.5 kg (11/12 0450) Last BM Date: 09/19/20 General:    white male in NAD Heart:  Regular rate and rhythm; no murmurs Lungs: Respirations even and unlabored, lungs CTA bilaterally Abdomen:  Soft, mild right upper quadrant TTP and nondistended. Normal bowel sounds. Extremities:  Without edema. Neurologic:  Alert and oriented,  grossly normal neurologically. Psych:  Cooperative. Normal mood and affect.  Intake/Output from previous day: 11/11 0701 - 11/12 0700 In: 2687 [P.O.:360; I.V.:2277.2; IV Piggyback:49.8] Out: 705 [Urine:550; Drains:125; Blood:30]  Lab Results: Recent Labs    09/17/20 0441 09/18/20 0439 09/19/20 0831  WBC 9.1 5.8 17.1*  HGB 12.0* 11.2* 11.4*  HCT 39.9 37.9* 39.8  PLT 397 314 361   BMET Recent Labs    09/17/20 0441 09/17/20 1030 09/17/20 1256 09/18/20 0439 09/19/20 0831  NA 135  --   --  138 137  K 3.1*   < > 3.7 4.7 4.4  CL 104  --   --  108 106  CO2 24  --   --  23 20*  GLUCOSE 104*  --   --  150* 101*  BUN 8  --   --  9 10  CREATININE 1.01  --   --  1.44* 1.23  CALCIUM 7.9*  --   --  8.0* 8.2*   < > = values in this interval not displayed.   LFT Recent Labs    09/19/20 0831  PROT 6.5  ALBUMIN 2.6*  AST 34  ALT 43  ALKPHOS 451*  BILITOT 1.3*   PT/INR Recent Labs    09/17/20 0441  LABPROT 15.2  INR 1.3*    Studies/Results: DG  ERCP BILIARY & PANCREATIC DUCTS  Result Date: 09/17/2020 CLINICAL DATA:  69 year old male with a history elevated LFTs EXAM: ERCP TECHNIQUE: Multiple spot images obtained with the fluoroscopic device and submitted for interpretation post-procedure. FLUOROSCOPY TIME:  Fluoroscopy Time:  3 minutes 54 seconds COMPARISON:  MR 09/15/2020 FINDINGS: Limited intraoperative fluoroscopic spot images during ERCP. Initial image demonstrates the endoscope projecting over the upper abdomen. Subsequently there is cannulation of the ampulla with a safety wire and retrograde infusion of contrast. Vague filling defects in the distal common bile duct. Deployment of a retrieval balloon. IMPRESSION: Limited images during ERCP demonstrates partial opacification of the extrahepatic biliary ducts with deployment of a retrieval balloon. Please refer to the dictated operative report for full details of intraoperative findings and procedure. Electronically Signed   By: Gilmer Mor D.O.   On: 09/17/2020 15:11      Assessment / Plan:     Assessment: 1.  Sepsis due to acute cholecystitis with choledocholithiasis: Status post ERCP with sphincterotomy on 09/17/2020, status post partial cholecystectomy on 09/18/2020, white blood cell count rising, likely due to complicated operation yesterday 2.  Elevated LFTs 3.  Hypokalemia 4.  AKI 5.  CAD  Plan:  1.  Reviewed patient's recent op note, this procedure was complicated.  His LFTs are trending down, no concern for recurrence of bile duct stone. 2.  At this time appreciate surgical recommendations and we will likely sign off. 3.  Please await any final recommendations from Dr. Orvan Falconer later today.  Thank you for kind consultation.   LOS: 5 days   Unk Lightning  09/19/2020, 11:04 AM

## 2020-09-19 NOTE — Progress Notes (Signed)
Progress Note  Patient Name: [name] Rice Date of Encounter: 09/19/2020  Attending physician: Tyrone NineGrunz, Ryan B, MD Primary care provider: Richmond CampbellKaplan, Kristen W., PA-C Primary Cardiologist: Tessa LernerSunit Janalynn Eder, DO, [facility] Consultant:Rohith Fauth [place], DO, [facility]  Subjective: [name] Rice is a 69 y.o. male who was seen and examined at bedside at approximately 9 AM. No events overnight. Patient remains in atrial fibrillation. Since last evaluation patient has undergone ERCP and also laparoscopic cholecystectomy. Patient is doing well post procedure and does not have any chest pain or anginal equivalent.  He is resting in bed comfortably and hemodynamically stable.  Objective: Vital Signs in the last 24 hours: Temp:  [97.3 F (36.3 C)-97.8 F (36.6 C)] 97.5 F (36.4 C) (11/12 0450) Pulse Rate:  [64-88] 66 (11/12 0913) Resp:  [16-22] 16 (11/12 0450) BP: (111-129)/(66-83) 111/67 (11/12 0913) SpO2:  [96 %-100 %] 97 % (11/12 0450) Weight:  [105.5 kg] 105.5 kg (11/12 0450)  Intake/Output:  Intake/Output Summary (Last 24 hours) at 09/19/2020 1021 Last data filed at 09/19/2020 0603 Gross per 24 hour  Intake 2687.02 ml  Output 705 ml  Net 1982.02 ml    Net IO Since Admission: 8,285.53 mL [09/19/20 1021]  Weights:  Filed Weights   09/18/20 0453 09/18/20 0842 09/19/20 0450  Weight: 108.5 kg 106.6 kg 105.5 kg    Telemetry: Personally reviewed.  Atrial fibrillation.  Physical examination: PHYSICAL EXAM: Vitals with BMI 09/19/2020 09/19/2020 09/18/2020  Height - - -  Weight - 232 lbs 9 oz -  BMI - 29.85 -  Systolic 111 117 161120  Diastolic 67 66 80  Pulse 66 76 88    CONSTITUTIONAL: Appears older than stated age, hemodynamically stable, no acute distress.   SKIN: Skin is warm and dry. No rash noted. No cyanosis. No pallor. No jaundice HEAD: Normocephalic and atraumatic.  EYES: No scleral icterus MOUTH/THROAT: Moist oral membranes.  NECK: No JVD present. No thyromegaly noted. No carotid bruits   LYMPHATIC: No visible cervical adenopathy.  CHEST Normal respiratory effort. No intercostal retractions.  Sternotomy site is healed well LUNGS: Clear to auscultation bilaterally.  No stridor. No wheezes. No rales.  CARDIOVASCULAR: Irregularly irregular, positive S1-S2, no JVP, no murmurs rubs or gallops appreciated. ABDOMINAL: Obese, soft, nontender, nondistended, positive bowel sounds in all 4 quadrants, drain noted on the right lateral abdominal wall draining serosanguineous fluid, no apparent ascites.  EXTREMITIES: No peripheral edema  HEMATOLOGIC: No significant bruising NEUROLOGIC: Oriented to person, place, and time. Nonfocal. Normal muscle tone.  PSYCHIATRIC: Normal mood and affect. Normal behavior. Cooperative  Lab Results: Hematology Recent Labs  Lab 09/17/20 0441 09/18/20 0439 09/19/20 0831  WBC 9.1 5.8 17.1*  RBC 4.23 3.95* 4.06*  HGB 12.0* 11.2* 11.4*  HCT 39.9 37.9* 39.8  MCV 94.3 95.9 98.0  MCH 28.4 28.4 28.1  MCHC 30.1 29.6* 28.6*  RDW 17.2* 17.0* 17.5*  PLT 397 314 361    Chemistry Recent Labs  Lab 09/17/20 0441 09/17/20 1030 09/17/20 1256 09/18/20 0439 09/19/20 0831  NA 135  --   --  138 137  K 3.1*   < > 3.7 4.7 4.4  CL 104  --   --  108 106  CO2 24  --   --  23 20*  GLUCOSE 104*  --   --  150* 101*  BUN 8  --   --  9 10  CREATININE 1.01  --   --  1.44* 1.23  CALCIUM 7.9*  --   --  8.0*  8.2*  PROT 6.2*  --   --  6.3* 6.5  ALBUMIN 2.5*  --   --  2.4* 2.6*  AST 47*  --   --  26 34  ALT 51*  --   --  40 43  ALKPHOS 655*  --   --  543* 451*  BILITOT 1.1  --   --  0.7 1.3*  GFRNONAA >60  --   --  53* >60  ANIONGAP 7  --   --  7 11   < > = values in this interval not displayed.     Cardiac Enzymes: Cardiac Panel (last 3 results) No results for input(s): CKTOTAL, CKMB, TROPONINIHS, RELINDX in the last 72 hours.  BNP (last 3 results) Recent Labs    07/24/20 0748  BNP 206.5*    ProBNP (last 3 results) No results for input(s): PROBNP in the  last 8760 hours.   DDimer No results for input(s): DDIMER in the last 168 hours.   Hemoglobin A1c:  Lab Results  Component Value Date   HGBA1C 5.2 07/26/2020   MPG 102.54 07/26/2020    TSH  Recent Labs    07/02/20 1604 07/24/20 0748 09/16/20 0506  TSH 1.898 0.941 1.083    Lipid Panel     Component Value Date/Time   CHOL 78 07/26/2020 0515   CHOL 143 07/10/2019 0906   TRIG 92 07/26/2020 0515   HDL 24 (L) 07/26/2020 0515   HDL 51 07/10/2019 0906   CHOLHDL 3.3 07/26/2020 0515   VLDL 18 07/26/2020 0515   LDLCALC 36 07/26/2020 0515   LDLCALC 74 07/10/2019 0906    Imaging: DG ERCP BILIARY & PANCREATIC DUCTS  Result Date: 09/17/2020 CLINICAL DATA:  69 year old male with a history elevated LFTs EXAM: ERCP TECHNIQUE: Multiple spot images obtained with the fluoroscopic device and submitted for interpretation post-procedure. FLUOROSCOPY TIME:  Fluoroscopy Time:  3 minutes 54 seconds COMPARISON:  MR 09/15/2020 FINDINGS: Limited intraoperative fluoroscopic spot images during ERCP. Initial image demonstrates the endoscope projecting over the upper abdomen. Subsequently there is cannulation of the ampulla with a safety wire and retrograde infusion of contrast. Vague filling defects in the distal common bile duct. Deployment of a retrieval balloon. IMPRESSION: Limited images during ERCP demonstrates partial opacification of the extrahepatic biliary ducts with deployment of a retrieval balloon. Please refer to the dictated operative report for full details of intraoperative findings and procedure. Electronically Signed   By: Gilmer Mor D.O.   On: 09/17/2020 15:11    CARDIAC DATABASE: EKG: 09/14/2020: Atrial fibrillation, 89 bpm, left bundle branch block, without underlying injury pattern.  Coronary artery bypass grafting: 07/29/2020 (by Dr. Vickey Sages at [facility]): Left Internal Mammary Artery to Distal Left Anterior Descending Coronary Artery; pedicled RIMAGraft to Posterior  Descending Coronary Artery; left radial arteryGraft to Obtuse Marginal Branch of Left Circumflex Coronary Artery and ramus intermedius as a sequenced graft. Bi-atrial Maze procedure and left atrial appendage clipping  Echocardiogram: 12/31/2019: Mildly depressed LV systolic function with visual EF 45-50%. Left ventricle cavity is normal in size. Left ventricle regional wall motion findings: Mid anteroseptal, Mid inferoseptal, Apical anterior, Apical septal and Apical cap hypokinesis. Mild left ventricular hypertrophy. Unable to evaluate diastolic function due to atrial fibrillation. Calculated EF 50%. Left atrial cavity is severely dilated. Right atrial cavity is slightly dilated. Right ventricle cavity is normal in size. Low normal right ventricular function. Mild aortic valve leaflet thickening with mild calcification. Mildly restricted aortic valve leaflets. No evidence of aortic  valve stenosis. No aortic valve regurgitation. Mild tricuspid regurgitation. Mild pulmonary hypertension. RVSP measures 35 mmHg. IVC is dilated with a respiratory response of >50%. Prior study 08/2016: Moderate concentric hypertrophy of the left ventricle. Normal global wall motion. Visual EF is 55-60%. Indeterminate diastolic filling pattern, indeterminate LAP due to A. Fib. Biatrial enlargement. Mild (Grade I) mitral regurgitation. Mild tricuspid regurgitation. No evidence of pulmonary hypertension.  Stress Testing: 02/18/2020: No previous exam available for comparison. Lexiscan nuclear stress test performed using 1-day protocol. Stress EKG is non-diagnostic, as this is pharmacological stress test. In addition, rest and stress EKG showed atrial fibrillation with slow ventricular response, anterolateral T wave inversion. Stress LVEF 77%. Normal myocardial perfusion. Low risk study.  Heart Catheterization: 07/25/20: RCA: Proximal RCA 80% stenosis, large vessel with mild disease in PL and PDA branches. A  secondary PL branch is subtotally occluded. LM: Distal 30-40% stenosis. LAD: LAD diffusely diseased, proximal diffuse 80% followed by tandem 90% stenosis. Large D1 with moderate diffuse disease and proximal and mid tandem 70 and 80% stenosis. Has secondary branches and is tortuous. Cx and RI: Co-dominant Cx with Ostial circumflex 99% involving a moderate sized ramus with ostial 80 to 90% and proximal 90% stenosis. LV: Normal LVEDP. No pressure gradient across the aortic valve. Patent LIMA and RIMA and RIMA has ostial 20-30% stenosis. Subclavian arteries widely patent.  Right radial diffusely disease by [place]. 36mL contrast used.   Carotid duplex: 07/27/2020:  Right Carotid: Velocities in the right ICA are consistent with a 1-39% stenosis.  Left Carotid: Velocities in the left ICA are consistent with a 60-79% stenosis.  Vertebrals: Bilateral vertebral arteries demonstrate antegrade flow.   14 day extended Holter monitor: Dominant rhythm atrial fibrillation (HR between 25-137bpm, avg. 59bpm).  Overall HR 25-197 bpm. Avg HR 59 bpm. 1519 pauses that were 3 secs or longer.  The longest pause 9.5 sec at 12:36 AM (07/04/2020), followed by 9.4 sec pause at 4:30 AM (07/11/2020), third longest pause 8.9 sec (07/08/2020). These are auto-triggered events. 20 episodes of NSVT reported. Longest episode 20 beats in duration for 10.5 seconds at an average rate 116 bpm. The fastest episode was 6 beats in duration at average rate of 170 bpm.  Total ventricular ectopic burden <1%. Patient activated events: 0.  Scheduled Meds: . amLODipine  5 mg Oral QHS  . feeding supplement  1 Container Oral TID BM  . metoprolol tartrate  12.5 mg Oral Daily    Continuous Infusions: . sodium chloride 100 mL/hr at 09/18/20 2211  . piperacillin-tazobactam (ZOSYN)  IV 3.375 g (09/19/20 0603)    PRN Meds: HYDROcodone-acetaminophen, morphine injection, ondansetron (ZOFRAN) IV   IMPRESSION & RECOMMENDATIONS: [name] Rice is a 69 y.o. 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  Preoperative risk stratification: Patient was seen in consultation I was deemed low to moderate risk from a cardiovascular standpoint to undergo ERCP and laparoscopic cholecystectomy.  Patient has done well after both of these procedures. He denies any chest pain or anginal equivalent. Telemetry reviewed and he remains in atrial fibrillation. Recommend reinitiation of Xarelto as per GI and general surgery recommendations since he is undergone recent procedure/surgery respectively.  Multivessel CAD status post four-vessel bypass surgery without angina pectoris:  Continue beta-blocker therapy. Recommend reinitiation of aspirin 81 mg p.o. daily once cleared by GI and general surgery. Recommend reinitiation of statin therapy prior to discharge or when clinically appropriate as per GI and general surgery recommendations. Continue amlodipine.  Benign essential hypertension: Blood pressures within acceptable range.  Currently managed per primary team.  Continue your care regarding his acute cholecystitis, choledocholithiasis, and recent ERCP and laparoscopic cholecystectomy.  We will sign off for now but please do not hesitate to reach out if any questions or concerns arise during this hospitalization.  Recommend follow-up in 2 weeks post discharge.  Patient's questions and concerns were addressed to his satisfaction. He voices understanding of the instructions provided during this encounter.   This note was created using a voice recognition software as a result  there may be grammatical errors inadvertently enclosed that do not reflect the nature of this encounter. Every attempt is made to correct such errors.  Tessa Lerner, DO, [facility] Office: [phone] 09/19/2020, 10:21 AM

## 2020-09-20 DIAGNOSIS — R933 Abnormal findings on diagnostic imaging of other parts of digestive tract: Secondary | ICD-10-CM | POA: Diagnosis not present

## 2020-09-20 DIAGNOSIS — N179 Acute kidney failure, unspecified: Secondary | ICD-10-CM | POA: Diagnosis not present

## 2020-09-20 DIAGNOSIS — R748 Abnormal levels of other serum enzymes: Secondary | ICD-10-CM | POA: Diagnosis not present

## 2020-09-20 DIAGNOSIS — K810 Acute cholecystitis: Secondary | ICD-10-CM | POA: Diagnosis not present

## 2020-09-20 DIAGNOSIS — I10 Essential (primary) hypertension: Secondary | ICD-10-CM

## 2020-09-20 LAB — COMPREHENSIVE METABOLIC PANEL
ALT: 32 U/L (ref 0–44)
AST: 21 U/L (ref 15–41)
Albumin: 2.3 g/dL — ABNORMAL LOW (ref 3.5–5.0)
Alkaline Phosphatase: 338 U/L — ABNORMAL HIGH (ref 38–126)
Anion gap: 9 (ref 5–15)
BUN: 9 mg/dL (ref 8–23)
CO2: 21 mmol/L — ABNORMAL LOW (ref 22–32)
Calcium: 8.1 mg/dL — ABNORMAL LOW (ref 8.9–10.3)
Chloride: 106 mmol/L (ref 98–111)
Creatinine, Ser: 1.04 mg/dL (ref 0.61–1.24)
GFR, Estimated: >60 mL/min (ref >60)
Glucose, Bld: 99 mg/dL (ref 70–99)
Potassium: 3.9 mmol/L (ref 3.5–5.1)
Sodium: 136 mmol/L (ref 135–145)
Total Bilirubin: 1.1 mg/dL (ref 0.3–1.2)
Total Protein: 6 g/dL — ABNORMAL LOW (ref 6.5–8.1)

## 2020-09-20 LAB — CBC
HCT: 38 % — ABNORMAL LOW (ref 39.0–52.0)
Hemoglobin: 11.2 g/dL — ABNORMAL LOW (ref 13.0–17.0)
MCH: 28.2 pg (ref 26.0–34.0)
MCHC: 29.5 g/dL — ABNORMAL LOW (ref 30.0–36.0)
MCV: 95.7 fL (ref 80.0–100.0)
Platelets: 324 10*3/uL (ref 150–400)
RBC: 3.97 MIL/uL — ABNORMAL LOW (ref 4.22–5.81)
RDW: 17.7 % — ABNORMAL HIGH (ref 11.5–15.5)
WBC: 13.7 10*3/uL — ABNORMAL HIGH (ref 4.0–10.5)
nRBC: 0 % (ref 0.0–0.2)

## 2020-09-20 MED ORDER — RIVAROXABAN 20 MG PO TABS
20.0000 mg | ORAL_TABLET | Freq: Every day | ORAL | Status: DC
  Administered 2020-09-20 – 2020-09-22 (×3): 20 mg via ORAL
  Filled 2020-09-20 (×4): qty 1

## 2020-09-20 MED ORDER — ENOXAPARIN SODIUM 40 MG/0.4ML ~~LOC~~ SOLN
40.0000 mg | Freq: Every day | SUBCUTANEOUS | Status: DC
  Administered 2020-09-20: 40 mg via SUBCUTANEOUS
  Filled 2020-09-20: qty 0.4

## 2020-09-20 NOTE — Plan of Care (Signed)
  Problem: Clinical Measurements: Goal: Will remain free from infection Outcome: Progressing Goal: Diagnostic test results will improve Outcome: Progressing   Problem: Activity: Goal: Risk for activity intolerance will decrease Outcome: Progressing   

## 2020-09-20 NOTE — Progress Notes (Addendum)
2 Days Post-Op   Subjective:  Doing well with minimal abdominal discomfort  Taking po's well.  Objective: Vital signs in last 24 hours: Temp:  [97.7 F (36.5 C)-97.9 F (36.6 C)] 97.9 F (36.6 C) (11/13 0500) Pulse Rate:  [66-79] 68 (11/13 0500) Resp:  [20] 20 (11/12 2109) BP: (103-115)/(67-76) 107/70 (11/13 0500) SpO2:  [94 %-95 %] 94 % (11/12 2109) Last BM Date: 09/19/20   Intake/Output from previous day: 11/12 0701 - 11/13 0700 In: 582.4 [I.V.:482.4; IV Piggyback:100] Out: 675 [Urine:650; Drains:25] Intake/Output this shift: No intake/output data recorded.  General: Older heavy WM who is alert and in good spirits.  Abdomen: Soft. No mass. Incisions look good.  Drain in RUQ - thin bloody serous fluid.   25 cc recorded last 24 hours. Extremities:  Good strength and ROM  in upper and lower extremities.  Lab Results:  Recent Labs    09/19/20 0831 09/20/20 0559  WBC 17.1* 13.7*  HGB 11.4* 11.2*  HCT 39.8 38.0*  PLT 361 324    BMET Recent Labs    09/19/20 0831 09/20/20 0559  NA 137 136  K 4.4 3.9  CL 106 106  CO2 20* 21*  GLUCOSE 101* 99  BUN 10 9  CREATININE 1.23 1.04  CALCIUM 8.2* 8.1*   PT/INR No results for input(s): LABPROT, INR in the last 72 hours.  Recent Labs  Lab 09/16/20 0506 09/17/20 0441 09/18/20 0439 09/19/20 0831 09/20/20 0559  AST 41 47* 26 34 21  ALT 51* 51* 40 43 32  ALKPHOS 592* 655* 543* 451* 338*  BILITOT 1.5* 1.1 0.7 1.3* 1.1  PROT 6.1* 6.2* 6.3* 6.5 6.0*  ALBUMIN 2.3* 2.5* 2.4* 2.6* 2.3*     Lipase     Component Value Date/Time   LIPASE 40 09/18/2020 0439     Medications: . amLODipine  5 mg Oral QHS  . feeding supplement  1 Container Oral TID BM  . metoprolol tartrate  12.5 mg Oral Daily  . pneumococcal 23 valent vaccine  0.5 mL Intramuscular Tomorrow-1000   . sodium chloride 100 mL/hr at 09/19/20 2033  . piperacillin-tazobactam (ZOSYN)  IV 3.375 g (09/20/20 0517)    Assessment/Plan CAD- s/pCABGx  49/21/2021byDr. Z Atkins.  PAF-on Xareltoat home.              Currently on hold,last dose 09/14/2020- ok to restart Hypertension Diarrhea- notes ongoing diarrhea x2 months. Per GI  Infarcted gallbladder with cholelithiasis Choledocholithiasis  S/p ERCP/sphincterotomy/balloon extraction biliary stones 09/17/2020 Dr. Claudette Head  S/p laparoscopic partial cholecystectomy/drain placement 09/18/2020 Dr. Ovidio Kin, POD #3  WBC - trending down  LFT's okay  FEN: reg diet ID: [id number] 11/7 >> day 7 (needs 7-10 days postop antibiotics) [id number]  Follow-up: Dr. Ezzard Standing  Plan: Cont diet as tolerated  Recheck CBC tomorrow  Follow drain output  Ambulate    LOS: 6 days   [name] Rice 09/20/2020 Please see Amion

## 2020-09-20 NOTE — Progress Notes (Signed)
PROGRESS NOTE  [name] Rice  ZOX:[id number] DOB: January 02, 1951 DOA: 09/14/2020 PCP: [name], PA-C   Brief Narrative: [name] Rice [name] Rice a 69 y.o.malewith a history of CAD s/p 4v CABG 07/29/2020, HTN, PAF s/p MAZE on xarelto who presented to the ED 11/7 with diarrhea and abdominal pain. Work up demonstrated enlarged common bile duct (19mm) with layering sludge for which GI and surgery were consulted. ERCP performed 09/17/2020 with successful extraction of CBD stone and sludge. Laparoscopic cholecystectomy was performed 11/11 by Dr. Ezzard Standing where gallbladder was found to be necrotic and severely inflammed requiring incomplete removal and placement of JP drain. Zosyn continues. Xarelto restarted 11/13 per surgery clearance.   Assessment & Plan: Principal Problem:   Acute cholecystitis Active Problems:   Atrial fibrillation (HCC)   Pure hypercholesterolemia   Hypokalemia   CAD, multiple vessel   AKI (acute kidney injury) (HCC)   Sepsis (HCC)   Abdominal pain   Abnormal CT of the abdomen   Abnormal liver enzymes   Choledocholithiasis with acute cholecystitis   Abnormal magnetic resonance cholangiopancreatography (MRCP)   Common bile duct (CBD) obstruction   Elevated LFTs   Benign hypertension  Choledocholithiasis: s/p removal of stones and sludge by biliary sphincterotomy and balloon extraction at ERCP 11/10. Reactive changes without dysplasia or malignancy identified on pathology from biopsy of abnormal mucosa at ampullary orifice  - Avoid aspirin, NSAIDs x1 week.  - Follow LFTs - Ok per GI and surgery to restart xarelto 11/13                 Cholelithiasis, infarcted gallbladder s/p laparoscopic partial cholecystectomy 11/11 with JP drain placement in GB fossa:  - Continue IV antibiotics over the weekend per surgery - zosyn  - Serosanguinous JP drain output 25cc/24hrs.   Diarrhea: Unclear etiology. CDiff negative. - Follow with GI. Overall improving.   Hypokalemia:  -  Stopped standing supplementation with rise. Will continue to monitor and supplement as indicated.  AKI: Post-ERCP. Now improving. Continues improvement today.   - Continue hydration, avoid hypotension and nephrotoxins.  Persistent atrial fibrillation s/p biatrial MAZE, LAA clipping:  - Restart xarelto.  - Continue BB, rate is controlled.  - Continuing telemetry due to AFib and hx NSVT.  CAD s/p CABG x4 September 2021, LBBB, HTN, HLD: No angina or heart failure symptoms.  - Continue metoprolol, statin, norvasc - Restart aspirin in 1 week per GI recommendations.  - Per cardiology, pt's CV risk is low-moderate.   Left carotid stenosis:  - Outpatient follow up recommended.   LFT elevation:  - Monitoring. Improved.  DVT prophylaxis: SCDs > xarelto Code Status: Full Family Communication: None at bedside Disposition Plan:  Status is: Inpatient  Remains inpatient appropriate because:Ongoing diagnostic testing needed not appropriate for outpatient work up and Inpatient level of care appropriate due to severity of illness  Dispo: The patient is from: Home              Anticipated d/c is to: Home              Anticipated d/c date is: 2 days pending post-operative course, general surgery recommendations.              Patient currently is not medically stable to d/c.  Consultants:   GI  General surgery  Procedures:   ERCP 09/17/2020 Dr. [name]:  Impression:       - Bulging ampulla which decompressed post  sphinterotomy and CBD clearing.                           - The common bile duct was dilated, with sludge and                            stones causing an obstruction.                           - Choledocholithiasis and a large volume of sludge                            was found in the CBD. Complete removal was                            accomplished by biliary sphincterotomy and balloon                            extraction.                            - A biliary sphincterotomy was performed.                           - Area of abnormal mucosa at the ampulla orifice,                            along the sphincterotomy margins was evident after                            sphincterotomy. Biopsied. Recommendation: Avoid aspirin and nonsteroidal anti-inflammatory                            medicines for 1 week.                           - Observe patient's clinical course following                            today's ERCP with therapeutic intervention.                           - Trend LFTs.                           - Await path results.                           - Pending pathology results and clinical course                            further evaluation with EUS may be indicated.                           - Proceed with cholecystectomy per general surgery.                           -  OK to resume Xarelto in 3 days as indicated                            however discuss timing with general surgery                            regarding scheduling of cholecystectomy.  Antimicrobials:  Zosyn   Subjective: Limited po intake, but no vomiting or diarrhea. Abdominal pain is minimal. In good spirits. No bleeding. The pain associated with the drain is slightly worse today from yesterday.  Objective: Vitals:   09/19/20 2109 09/20/20 0500 09/20/20 0956 09/20/20 1253  BP: 115/71 107/70 103/76 121/72  Pulse: 79 68 81 76  Resp: 20   18  Temp: 97.7 F (36.5 C) 97.9 F (36.6 C)  98.3 F (36.8 C)  TempSrc: Oral Oral  Oral  SpO2: 94%   95%  Weight:      Height:        Intake/Output Summary (Last 24 hours) at 09/20/2020 1421 Last data filed at 09/20/2020 1914 Gross per 24 hour  Intake 582.4 ml  Output 677 ml  Net -94.6 ml   Filed Weights   09/18/20 0453 09/18/20 0842 09/19/20 0450  Weight: 108.5 kg 106.6 kg 105.5 kg   Gen: 69 y.o. male in no distress Pulm: Nonlabored breathing room air. Clear. CV: Irreg without murmur, rub, or  gallop. No definite JVD, no pitting dependent edema. GI: Abdomen soft, minimally tender, only around drain site, protuberant without distention. drain dressing c/d/i without spreading erythema. Serosanguinous drain output. Normoactive bowel sounds.  Ext: Warm, no deformities Skin: No new rashes, lesions or ulcers on visualized skin. Dermabonded surgical lap sites c/d/i. Neuro: Alert and oriented. No focal neurological deficits. Psych: Judgement and insight appear fair. Mood euthymic & affect congruent. Behavior is appropriate.    Data Reviewed: I have personally reviewed following labs and imaging studies  CBC: Recent Labs  Lab 09/15/20 0441 09/17/20 0441 09/18/20 0439 09/19/20 0831 09/20/20 0559  WBC 10.4 9.1 5.8 17.1* 13.7*  NEUTROABS 7.9*  --   --   --   --   HGB 11.4* 12.0* 11.2* 11.4* 11.2*  HCT 38.6* 39.9 37.9* 39.8 38.0*  MCV 95.8 94.3 95.9 98.0 95.7  PLT 376 397 314 361 324   Basic Metabolic Panel: Recent Labs  Lab 09/14/20 1240 09/15/20 0006 09/15/20 0441 09/15/20 0441 09/16/20 0506 09/16/20 0506 09/17/20 0441 09/17/20 0441 09/17/20 1030 09/17/20 1256 09/18/20 0439 09/19/20 0831 09/20/20 0559  NA   < >  --  137   < > 140  --  135  --   --   --  138 137 136  K   < >  --  3.4*   < > 3.4*   < > 3.1*   < > 3.3* 3.7 4.7 4.4 3.9  CL   < >  --  103   < > 106  --  104  --   --   --  108 106 106  CO2   < >  --  22   < > 22  --  24  --   --   --  23 20* 21*  GLUCOSE   < >  --  79   < > 67*  --  104*  --   --   --  150* 101* 99  BUN   < >  --  16   < > 11  --  8  --   --   --  9 10 9   CREATININE   < >  --  1.02   < > 1.11  --  1.01  --   --   --  1.44* 1.23 1.04  CALCIUM   < >  --  7.8*   < > 8.3*  --  7.9*  --   --   --  8.0* 8.2* 8.1*  MG  --  1.9 1.9  --   --   --  1.9  --   --   --  1.7  --   --    < > = values in this interval not displayed.   GFR: Estimated Creatinine Clearance: 89.2 mL/min (by C-G formula based on SCr of 1.04 mg/dL). Liver Function  Tests: Recent Labs  Lab 09/16/20 0506 09/17/20 0441 09/18/20 0439 09/19/20 0831 09/20/20 0559  AST 41 47* 26 34 21  ALT 51* 51* 40 43 32  ALKPHOS 592* 655* 543* 451* 338*  BILITOT 1.5* 1.1 0.7 1.3* 1.1  PROT 6.1* 6.2* 6.3* 6.5 6.0*  ALBUMIN 2.3* 2.5* 2.4* 2.6* 2.3*   Recent Labs  Lab 09/14/20 1240 09/18/20 0439  LIPASE 36 40   No results for input(s): AMMONIA in the last 168 hours. Coagulation Profile: Recent Labs  Lab 09/15/20 0441 09/17/20 0441  INR 1.7* 1.3*   Urine analysis:    Component Value Date/Time   COLORURINE BROWN (A) 09/14/2020 1720   APPEARANCEUR CLOUDY (A) 09/14/2020 1720   LABSPEC <1.005 (L) 09/14/2020 1720   PHURINE 5.5 09/14/2020 1720   GLUCOSEU NEGATIVE 09/14/2020 1720   HGBUR LARGE (A) 09/14/2020 1720   BILIRUBINUR MODERATE (A) 09/14/2020 1720   KETONESUR 15 (A) 09/14/2020 1720   PROTEINUR 30 (A) 09/14/2020 1720   NITRITE NEGATIVE 09/14/2020 1720   LEUKOCYTESUR NEGATIVE 09/14/2020 1720   Recent Results (from the past 240 hour(s))  Culture, blood (routine x 2)     Status: None   Collection Time: 09/14/20 12:00 PM   Specimen: BLOOD RIGHT ARM  Result Value Ref Range Status   Specimen Description   Final    BLOOD RIGHT ARM Performed at [facility], [address] [place], [place]    Special Requests   Final    BOTTLES DRAWN AEROBIC AND ANAEROBIC Blood Culture adequate volume Performed at [facility], [address] [place], [place]    Culture   Final    NO GROWTH 5 DAYS Performed at [facility], [address] [place], [place]    Report Status 09/19/2020 FINAL  Final  C Difficile Quick Screen w PCR reflex     Status: None   Collection Time: 09/14/20  2:20 PM   Specimen: STOOL  Result Value Ref Range Status   C Diff antigen NEGATIVE NEGATIVE Final   C Diff toxin NEGATIVE NEGATIVE Final   C Diff interpretation No C. difficile detected.  Final    Comment: Performed at  [facility], [address], [place], [place]  Respiratory Panel by RT PCR (Flu A&B, Covid) - Nasopharyngeal Swab     Status: None   Collection Time: 09/14/20  4:04 PM   Specimen: Nasopharyngeal Swab  Result Value Ref Range Status   SARS Coronavirus 2 by RT PCR NEGATIVE NEGATIVE Final    Comment: (NOTE) SARS-CoV-2 target nucleic acids are  NOT DETECTED.  The SARS-CoV-2 RNA is generally detectable in upper respiratoy specimens during the acute phase of infection. The lowest concentration of SARS-CoV-2 viral copies this assay can detect is 131 copies/mL. A negative result does not preclude SARS-Cov-2 infection and should not be used as the sole basis for treatment or other patient management decisions. A negative result may occur with  improper specimen collection/handling, submission of specimen other than nasopharyngeal swab, presence of viral mutation(s) within the areas targeted by this assay, and inadequate number of viral copies (<131 copies/mL). A negative result must be combined with clinical observations, patient history, and epidemiological information. The expected result is Negative.  Fact Sheet for Patients:  https://www.moore.com/https://www.fda.gov/media/142436/download  Fact Sheet for Healthcare Providers:  https://www.young.biz/https://www.fda.gov/media/142435/download  This test is no t yet approved or cleared by the [place] FDA and  has been authorized for detection and/or diagnosis of SARS-CoV-2 by FDA under an Emergency Use Authorization (EUA). This EUA will remain  in effect (meaning this test can be used) for the duration of the COVID-19 declaration under Section 564(b)(1) of the Act, 21 U.S.C. section 360bbb-3(b)(1), unless the authorization is terminated or revoked sooner.     Influenza A by PCR NEGATIVE NEGATIVE Final   Influenza B by PCR NEGATIVE NEGATIVE Final    Comment: (NOTE) The Xpert Xpress SARS-CoV-2/FLU/RSV assay is intended as an aid in  the diagnosis of influenza  from Nasopharyngeal swab specimens and  should not be used as a sole basis for treatment. Nasal washings and  aspirates are unacceptable for Xpert Xpress SARS-CoV-2/FLU/RSV  testing.  Fact Sheet for Patients: https://www.moore.com/https://www.fda.gov/media/142436/download  Fact Sheet for Healthcare Providers: https://www.young.biz/https://www.fda.gov/media/142435/download  This test is not yet approved or cleared by the [place] FDA and  has been authorized for detection and/or diagnosis of SARS-CoV-2 by  FDA under an Emergency Use Authorization (EUA). This EUA will remain  in effect (meaning this test can be used) for the duration of the  Covid-19 declaration under Section 564(b)(1) of the Act, 21  U.S.C. section 360bbb-3(b)(1), unless the authorization is  terminated or revoked. Performed at [facility], [address] [place], [place] [address]       Radiology Studies: No results found.  Scheduled Meds: . amLODipine  5 mg Oral QHS  . enoxaparin (LOVENOX) injection  40 mg Subcutaneous Daily  . feeding supplement  1 Container Oral TID BM  . metoprolol tartrate  12.5 mg Oral Daily  . pneumococcal 23 valent vaccine  0.5 mL Intramuscular Tomorrow-1000   Continuous Infusions: . sodium chloride 100 mL/hr at 09/20/20 1322  . piperacillin-tazobactam (ZOSYN)  IV 3.375 g (09/20/20 1323)     LOS: 6 days   Time spent: 25 minutes.  Tyrone Nineyan B Anna Beaird, MD [facility] [email or web address] 09/20/2020, 2:21 PM

## 2020-09-20 NOTE — Progress Notes (Signed)
ANTICOAGULATION CONSULT NOTE - Initial Consult  Pharmacy Consult for rivaroxaban Indication: atrial fibrillation  No Known Allergies  Patient Measurements: Height: 6\' 2"  (188 cm) Weight: 105.5 kg (232 lb 9.4 oz) IBW/kg (Calculated) : 82.2  Vital Signs: Temp: 98.3 F (36.8 C) (11/13 1253) Temp Source: Oral (11/13 1253) BP: 121/72 (11/13 1253) Pulse Rate: 76 (11/13 1253)  Labs: Recent Labs    09/18/20 0439 09/18/20 0439 09/19/20 0831 09/20/20 0559  HGB 11.2*   < > 11.4* 11.2*  HCT 37.9*  --  39.8 38.0*  PLT 314  --  361 324  CREATININE 1.44*  --  1.23 1.04   < > = values in this interval not displayed.    Estimated Creatinine Clearance: 89.2 mL/min (by C-G formula based on SCr of 1.04 mg/dL).   Medical History: Past Medical History:  Diagnosis Date  . Atrial fibrillation (HCC)   . Coronary artery disease   . Dysrhythmia   . Hypertension   . LBBB (left bundle branch block)     Medications: Rivaroxaban 20 mg PO daily PTA  -LD 11/7  Assessment: Pt is a 31 yoM who takes rivaroxaban PTA for history of atrial fibrillation. Anticoagulation has been on hold since 11/7; pt underwent ERCP on 11/10. Clearance from GI and CCS to resume rivaroxaban today.   Today, 09/20/20 -CBC: Hgb (11.2) slightly low but stable, Plt 324 WNL -SCr 1.04, CrCl 89 mL/min -Enoxaparin 40 mg dose for DVT ppx given 11/13    Plan:   Discontinue enoxaparin  Resume home dose of rivaroxaban 20 mg PO daily with supper this evening  CBC ordered with AM labs tomorrow  Pharmacy to sign off. Please re-consult if needed.   12/13, PharmD 09/20/2020,2:35 PM

## 2020-09-21 ENCOUNTER — Encounter (HOSPITAL_COMMUNITY): Payer: Self-pay | Admitting: Gastroenterology

## 2020-09-21 DIAGNOSIS — R933 Abnormal findings on diagnostic imaging of other parts of digestive tract: Secondary | ICD-10-CM | POA: Diagnosis not present

## 2020-09-21 DIAGNOSIS — N179 Acute kidney failure, unspecified: Secondary | ICD-10-CM | POA: Diagnosis not present

## 2020-09-21 DIAGNOSIS — R748 Abnormal levels of other serum enzymes: Secondary | ICD-10-CM | POA: Diagnosis not present

## 2020-09-21 DIAGNOSIS — K810 Acute cholecystitis: Secondary | ICD-10-CM | POA: Diagnosis not present

## 2020-09-21 LAB — COMPREHENSIVE METABOLIC PANEL
ALT: 24 U/L (ref 0–44)
AST: 16 U/L (ref 15–41)
Albumin: 2.1 g/dL — ABNORMAL LOW (ref 3.5–5.0)
Alkaline Phosphatase: 259 U/L — ABNORMAL HIGH (ref 38–126)
Anion gap: 8 (ref 5–15)
BUN: 9 mg/dL (ref 8–23)
CO2: 23 mmol/L (ref 22–32)
Calcium: 7.9 mg/dL — ABNORMAL LOW (ref 8.9–10.3)
Chloride: 104 mmol/L (ref 98–111)
Creatinine, Ser: 0.99 mg/dL (ref 0.61–1.24)
GFR, Estimated: >60 mL/min (ref >60)
Glucose, Bld: 99 mg/dL (ref 70–99)
Potassium: 3.2 mmol/L — ABNORMAL LOW (ref 3.5–5.1)
Sodium: 135 mmol/L (ref 135–145)
Total Bilirubin: 0.9 mg/dL (ref 0.3–1.2)
Total Protein: 5.5 g/dL — ABNORMAL LOW (ref 6.5–8.1)

## 2020-09-21 LAB — CBC
HCT: 34.8 % — ABNORMAL LOW (ref 39.0–52.0)
Hemoglobin: 10.3 g/dL — ABNORMAL LOW (ref 13.0–17.0)
MCH: 28.5 pg (ref 26.0–34.0)
MCHC: 29.6 g/dL — ABNORMAL LOW (ref 30.0–36.0)
MCV: 96.1 fL (ref 80.0–100.0)
Platelets: 295 10*3/uL (ref 150–400)
RBC: 3.62 MIL/uL — ABNORMAL LOW (ref 4.22–5.81)
RDW: 17.4 % — ABNORMAL HIGH (ref 11.5–15.5)
WBC: 11.9 10*3/uL — ABNORMAL HIGH (ref 4.0–10.5)
nRBC: 0 % (ref 0.0–0.2)

## 2020-09-21 MED ORDER — POTASSIUM CHLORIDE CRYS ER 20 MEQ PO TBCR
40.0000 meq | EXTENDED_RELEASE_TABLET | Freq: Once | ORAL | Status: AC
  Administered 2020-09-21: 40 meq via ORAL
  Filled 2020-09-21: qty 2

## 2020-09-21 NOTE — Progress Notes (Signed)
PROGRESS NOTE  [name] Rice  YKD:[id number] DOB: 02/01/1951 DOA: 09/14/2020 PCP: [name], PA-C   Brief Narrative: [name] Kainz Wilsonis a 69 y.o.malewith a history of CAD s/p 4v CABG 07/29/2020, HTN, PAF s/p MAZE on xarelto who presented to the ED 11/7 with diarrhea and abdominal pain. Work up demonstrated enlarged common bile duct (52mm) with layering sludge for which GI and surgery were consulted. ERCP performed 09/17/2020 with successful extraction of CBD stone and sludge. Laparoscopic cholecystectomy was performed 11/11 by Dr. Ezzard Standing where gallbladder was found to be necrotic and severely inflammed requiring incomplete removal and placement of JP drain. Zosyn continues with plans per surgery for 7-10 days antibiotics post-op. Xarelto restarted 11/13 per surgery clearance.   Assessment & Plan: Principal Problem:   Acute cholecystitis Active Problems:   Atrial fibrillation (HCC)   Pure hypercholesterolemia   Hypokalemia   CAD, multiple vessel   AKI (acute kidney injury) (HCC)   Sepsis (HCC)   Abdominal pain   Abnormal CT of the abdomen   Abnormal liver enzymes   Choledocholithiasis with acute cholecystitis   Abnormal magnetic resonance cholangiopancreatography (MRCP)   Common bile duct (CBD) obstruction   Elevated LFTs   Benign hypertension  Choledocholithiasis: s/p removal of stones and sludge by biliary sphincterotomy and balloon extraction at ERCP 11/10. Reactive changes without dysplasia or malignancy identified on pathology from biopsy of abnormal mucosa at ampullary orifice  - Avoid aspirin, NSAIDs x1 week.  - Follow LFTs (improving)                Cholelithiasis, infarcted gallbladder s/p laparoscopic partial cholecystectomy 11/11 with JP drain placement in GB fossa:  - Continue IV antibiotics over the weekend per surgery - zosyn. Planning 7-10 days postoperative abx. - Monitoring drain output   Diarrhea: Unclear etiology. CDiff negative. - Follow with GI.  Overall improving.   Hypokalemia:  - Supplement today  AKI: Post-ERCP. Improved. - DC IVF and push po intake, avoid hypotension and nephrotoxins.  Persistent atrial fibrillation s/p biatrial MAZE, LAA clipping:  - Restarted xarelto.  - Continue BB, rate is controlled.  - Continuing telemetry due to AFib and hx NSVT.  CAD s/p CABG x4 September 2021, LBBB, HTN, HLD: No angina or heart failure symptoms.  - Continue metoprolol, statin, norvasc - Restart aspirin in 1 week per GI recommendations.  - Per cardiology, pt's CV risk is low-moderate.   Left carotid stenosis:  - Outpatient follow up recommended.   LFT elevation:  - Monitoring. Improved.  DVT prophylaxis: Xarelto Code Status: Full Family Communication: None at bedside Disposition Plan:  Status is: Inpatient  Remains inpatient appropriate because:Ongoing diagnostic testing needed not appropriate for outpatient work up and Inpatient level of care appropriate due to severity of illness  Dispo: The patient is from: Home              Anticipated d/c is to: Home              Anticipated d/c date is: 2 days pending post-operative course, general surgery recommendations.              Patient currently is not medically stable to d/c.  Consultants:   GI  General surgery  Procedures:   ERCP 09/17/2020 Dr. [name]:  Impression:       - Bulging ampulla which decompressed post  sphinterotomy and CBD clearing.                           - The common bile duct was dilated, with sludge and                            stones causing an obstruction.                           - Choledocholithiasis and a large volume of sludge                            was found in the CBD. Complete removal was                            accomplished by biliary sphincterotomy and balloon                            extraction.                           - A biliary sphincterotomy was performed.                           -  Area of abnormal mucosa at the ampulla orifice,                            along the sphincterotomy margins was evident after                            sphincterotomy. Biopsied. Recommendation: Avoid aspirin and nonsteroidal anti-inflammatory                            medicines for 1 week.                           - Observe patient's clinical course following                            today's ERCP with therapeutic intervention.                           - Trend LFTs.                           - Await path results.                           - Pending pathology results and clinical course                            further evaluation with EUS may be indicated.                           - Proceed with cholecystectomy per general surgery.                           -  OK to resume Xarelto in 3 days as indicated                            however discuss timing with general surgery                            regarding scheduling of cholecystectomy.  Antimicrobials:  Zosyn   Subjective: Continues to not want anything to eat/drink, also has some nausea, no vomiting or significant abdominal pain. No fever.   Objective: Vitals:   09/20/20 1253 09/20/20 2051 09/21/20 0425 09/21/20 1046  BP: 121/72 129/75 115/73 111/72  Pulse: 76 84 84 97  Resp: Temp: 98.3 F (36.8 C) 97.8 F (36.6 C) 98.3 F (36.8 C) 98.2 F (36.8 C)  TempSrc: Oral Oral Oral Oral  SpO2: 95% 95% 96% 97%  Weight:   112.5 kg   Height:        Intake/Output Summary (Last 24 hours) at 09/21/2020 1423 Last data filed at 09/21/2020 0900 Gross per 24 hour  Intake 1464.53 ml  Output 303 ml  Net 1161.53 ml   Filed Weights   09/18/20 0842 09/19/20 0450 09/21/20 0425  Weight: 106.6 kg 105.5 kg 112.5 kg   Gen: 69 y.o. male in no distress Pulm: Nonlabored breathing room air. Clear. CV: Regular rate and rhythm. No murmur, rub, or gallop. No JVD, no dependent edema. GI: Abdomen soft, non-tender,  non-distended, with normoactive bowel sounds.  Ext: Warm, no deformities Skin: No new rashes, lesions or ulcers on visualized skin. Neuro: Alert and oriented. No focal neurological deficits. Psych: Judgement and insight appear fair. Mood euthymic & affect congruent. Behavior is appropriate.    Data Reviewed: I have personally reviewed following labs and imaging studies  CBC: Recent Labs  Lab 09/15/20 0441 09/15/20 0441 09/17/20 0441 09/18/20 0439 09/19/20 0831 09/20/20 0559 09/21/20 0532  WBC 10.4   < > 9.1 5.8 17.1* 13.7* 11.9*  NEUTROABS 7.9*  --   --   --   --   --   --   HGB 11.4*   < > 12.0* 11.2* 11.4* 11.2* 10.3*  HCT 38.6*   < > 39.9 37.9* 39.8 38.0* 34.8*  MCV 95.8   < > 94.3 95.9 98.0 95.7 96.1  PLT 376   < > 397 314 361 324 295   < > = values in this interval not displayed.   Basic Metabolic Panel: Recent Labs  Lab 09/15/20 0006 09/15/20 0441 09/16/20 0506 09/17/20 0441 09/17/20 1030 09/17/20 1256 09/18/20 0439 09/19/20 0831 09/20/20 0559 09/21/20 0532  NA  --  137   < > 135  --   --  138 137 136 135  K  --  3.4*   < > 3.1*   < > 3.7 4.7 4.4 3.9 3.2*  CL  --  103   < > 104  --   --  108 106 106 104  CO2  --  22   < > 24  --   --  23 20* 21* 23  GLUCOSE  --  79   < > 104*  --   --  150* 101* 99 99  BUN  --  16   < > 8  --   --  CREATININE  --  1.02   < > 1.01  --   --  1.44* 1.23 1.04 0.99  CALCIUM  --  7.8*   < > 7.9*  --   --  8.0* 8.2* 8.1* 7.9*  MG 1.9 1.9  --  1.9  --   --  1.7  --   --   --    < > = values in this interval not displayed.   GFR: Estimated Creatinine Clearance: 96.6 mL/min (by C-G formula based on SCr of 0.99 mg/dL). Liver Function Tests: Recent Labs  Lab 09/17/20 0441 09/18/20 0439 09/19/20 0831 09/20/20 0559 09/21/20 0532  AST 47* 26 34 21 16  ALT 51* 40 43 32 24  ALKPHOS 655* 543* 451* 338* 259*  BILITOT 1.1 0.7 1.3* 1.1 0.9  PROT 6.2* 6.3* 6.5 6.0* 5.5*  ALBUMIN 2.5* 2.4* 2.6* 2.3* 2.1*   Recent Labs  Lab  09/18/20 0439  LIPASE 40   No results for input(s): AMMONIA in the last 168 hours. Coagulation Profile: Recent Labs  Lab 09/15/20 0441 09/17/20 0441  INR 1.7* 1.3*   Urine analysis:    Component Value Date/Time   COLORURINE BROWN (A) 09/14/2020 1720   APPEARANCEUR CLOUDY (A) 09/14/2020 1720   LABSPEC <1.005 (L) 09/14/2020 1720   PHURINE 5.5 09/14/2020 1720   GLUCOSEU NEGATIVE 09/14/2020 1720   HGBUR LARGE (A) 09/14/2020 1720   BILIRUBINUR MODERATE (A) 09/14/2020 1720   KETONESUR 15 (A) 09/14/2020 1720   PROTEINUR 30 (A) 09/14/2020 1720   NITRITE NEGATIVE 09/14/2020 1720   LEUKOCYTESUR NEGATIVE 09/14/2020 1720   Recent Results (from the past 240 hour(s))  Culture, blood (routine x 2)     Status: None   Collection Time: 09/14/20 12:00 PM   Specimen: BLOOD RIGHT ARM  Result Value Ref Range Status   Specimen Description   Final    BLOOD RIGHT ARM Performed at [facility], [address] [place], [place] [address]    Special Requests   Final    BOTTLES DRAWN AEROBIC AND ANAEROBIC Blood Culture adequate volume Performed at [facility], [address] [place], [place] [address]    Culture   Final    NO GROWTH 5 DAYS Performed at [facility], [address] [place], [place] [address]    Report Status 09/19/2020 FINAL  Final  C Difficile Quick Screen w PCR reflex     Status: None   Collection Time: 09/14/20  2:20 PM   Specimen: STOOL  Result Value Ref Range Status   C Diff antigen NEGATIVE NEGATIVE Final   C Diff toxin NEGATIVE NEGATIVE Final   C Diff interpretation No C. difficile detected.  Final    Comment: Performed at [facility], [address], [place], [place] [address]  Respiratory Panel by RT PCR (Flu A&B, Covid) - Nasopharyngeal Swab     Status: None   Collection Time: 09/14/20  4:04 PM   Specimen: Nasopharyngeal Swab  Result Value Ref Range Status   SARS Coronavirus 2 by RT PCR NEGATIVE NEGATIVE Final    Comment:  (NOTE) SARS-CoV-2 target nucleic acids are NOT DETECTED.  The SARS-CoV-2 RNA is generally detectable in upper respiratoy specimens during the acute phase of infection. The lowest concentration of SARS-CoV-2 viral copies this assay can detect is 131 copies/mL. A negative result does not preclude SARS-Cov-2 infection and should not be used as the sole basis for treatment or other patient management decisions. A negative result may occur with  improper specimen collection/handling, submission of specimen other  than nasopharyngeal swab, presence of viral mutation(s) within the areas targeted by this assay, and inadequate number of viral copies (<131 copies/mL). A negative result must be combined with clinical observations, patient history, and epidemiological information. The expected result is Negative.  Fact Sheet for Patients:  https://www.moore.com/https://www.fda.gov/media/142436/download  Fact Sheet for Healthcare Providers:  https://www.young.biz/https://www.fda.gov/media/142435/download  This test is no t yet approved or cleared by the [place] FDA and  has been authorized for detection and/or diagnosis of SARS-CoV-2 by FDA under an Emergency Use Authorization (EUA). This EUA will remain  in effect (meaning this test can be used) for the duration of the COVID-19 declaration under Section 564(b)(1) of the Act, 21 U.S.C. section 360bbb-3(b)(1), unless the authorization is terminated or revoked sooner.     Influenza A by PCR NEGATIVE NEGATIVE Final   Influenza B by PCR NEGATIVE NEGATIVE Final    Comment: (NOTE) The Xpert Xpress SARS-CoV-2/FLU/RSV assay is intended as an aid in  the diagnosis of influenza from Nasopharyngeal swab specimens and  should not be used as a sole basis for treatment. Nasal washings and  aspirates are unacceptable for Xpert Xpress SARS-CoV-2/FLU/RSV  testing.  Fact Sheet for Patients: https://www.moore.com/https://www.fda.gov/media/142436/download  Fact Sheet for Healthcare  Providers: https://www.young.biz/https://www.fda.gov/media/142435/download  This test is not yet approved or cleared by the [place] FDA and  has been authorized for detection and/or diagnosis of SARS-CoV-2 by  FDA under an Emergency Use Authorization (EUA). This EUA will remain  in effect (meaning this test can be used) for the duration of the  Covid-19 declaration under Section 564(b)(1) of the Act, 21  U.S.C. section 360bbb-3(b)(1), unless the authorization is  terminated or revoked. Performed at [facility], [address] [place], [place] [address]       Radiology Studies: No results found.  Scheduled Meds: . amLODipine  5 mg Oral QHS  . feeding supplement  1 Container Oral TID BM  . metoprolol tartrate  12.5 mg Oral Daily  . pneumococcal 23 valent vaccine  0.5 mL Intramuscular Tomorrow-1000  . rivaroxaban  20 mg Oral Q supper   Continuous Infusions: . piperacillin-tazobactam (ZOSYN)  IV 3.375 g (09/21/20 1409)     LOS: 7 days   Time spent: 25 minutes.  Tyrone Nineyan B Jhane Lorio, MD [facility] [email or web address] 09/21/2020, 2:23 PM

## 2020-09-21 NOTE — Progress Notes (Signed)
3 Days Post-Op   Subjective:  Doing well with minimal abdominal discomfort.  No apettite  Objective: Vital signs in last 24 hours: Temp:  [97.8 F (36.6 C)-98.3 F (36.8 C)] 98.3 F (36.8 C) (11/14 0425) Pulse Rate:  [76-84] 84 (11/14 0425) Resp:  [18-20] 20 (11/14 0425) BP: (103-129)/(72-76) 115/73 (11/14 0425) SpO2:  [95 %-96 %] 96 % (11/14 0425) Weight:  [112.5 kg] 112.5 kg (11/14 0425) Last BM Date: 09/19/20   Intake/Output from previous day: 11/13 0701 - 11/14 0700 In: 1224.5 [I.V.:1049.8; IV Piggyback:174.8] Out: 205 [Urine:200; Drains:5] Intake/Output this shift: No intake/output data recorded.  General: Older heavy WM who is alert and in good spirits.  Abdomen: Soft. No mass. Incisions look good.  Drain in RUQ - bile tinged fluid.   5 cc recorded last 24 hours. Extremities:  Good strength and ROM  in upper and lower extremities.  Lab Results:  Recent Labs    09/20/20 0559 09/21/20 0532  WBC 13.7* 11.9*  HGB 11.2* 10.3*  HCT 38.0* 34.8*  PLT 324 295    BMET Recent Labs    09/20/20 0559 09/21/20 0532  NA 136 135  K 3.9 3.2*  CL 106 104  CO2 21* 23  GLUCOSE 99 99  BUN 9 9  CREATININE 1.04 0.99  CALCIUM 8.1* 7.9*   PT/INR No results for input(s): LABPROT, INR in the last 72 hours.  Recent Labs  Lab 09/17/20 0441 09/18/20 0439 09/19/20 0831 09/20/20 0559 09/21/20 0532  AST 47* 26 34 21 16  ALT 51* 40 43 32 24  ALKPHOS 655* 543* 451* 338* 259*  BILITOT 1.1 0.7 1.3* 1.1 0.9  PROT 6.2* 6.3* 6.5 6.0* 5.5*  ALBUMIN 2.5* 2.4* 2.6* 2.3* 2.1*     Lipase     Component Value Date/Time   LIPASE 40 09/18/2020 0439     Medications: . amLODipine  5 mg Oral QHS  . feeding supplement  1 Container Oral TID BM  . metoprolol tartrate  12.5 mg Oral Daily  . pneumococcal 23 valent vaccine  0.5 mL Intramuscular Tomorrow-1000  . potassium chloride  40 mEq Oral Once  . rivaroxaban  20 mg Oral Q supper   . piperacillin-tazobactam (ZOSYN)  IV 3.375 g  (09/21/20 2409)    Assessment/Plan CAD- s/pCABGx 49/21/2021byDr. Z Atkins.  PAF-on Xareltoat home. Restarted 11/13              Hypertension Diarrhea- notes ongoing diarrhea x2 months. Per GI  Infarcted gallbladder with cholelithiasis Choledocholithiasis  S/p ERCP/sphincterotomy/balloon extraction biliary stones 09/17/2020 Dr. Claudette Head  S/p laparoscopic partial cholecystectomy/drain placement 09/18/2020 Dr. Ovidio Kin, POD #3  WBC - trending down  LFT's okay  FEN: reg diet ID: Zosyn 11/7 >> day 8 (needs 7-10 days postop antibiotics) [id number]  Follow-up: Dr. Ezzard Standing  Plan: Cont diet as tolerated  Recheck CBC tomorrow  Follow drain output closely.  Will need to keep drain for a few weeks  Ambulate    LOS: 7 days   [name] Rice 09/21/2020 Please see Amion

## 2020-09-22 ENCOUNTER — Other Ambulatory Visit: Payer: Self-pay | Admitting: Cardiology

## 2020-09-22 DIAGNOSIS — R1011 Right upper quadrant pain: Secondary | ICD-10-CM | POA: Diagnosis not present

## 2020-09-22 DIAGNOSIS — R935 Abnormal findings on diagnostic imaging of other abdominal regions, including retroperitoneum: Secondary | ICD-10-CM | POA: Diagnosis not present

## 2020-09-22 DIAGNOSIS — I119 Hypertensive heart disease without heart failure: Secondary | ICD-10-CM

## 2020-09-22 DIAGNOSIS — R748 Abnormal levels of other serum enzymes: Secondary | ICD-10-CM | POA: Diagnosis not present

## 2020-09-22 DIAGNOSIS — K810 Acute cholecystitis: Secondary | ICD-10-CM | POA: Diagnosis not present

## 2020-09-22 LAB — CBC
HCT: 34.9 % — ABNORMAL LOW (ref 39.0–52.0)
Hemoglobin: 10.7 g/dL — ABNORMAL LOW (ref 13.0–17.0)
MCH: 28.2 pg (ref 26.0–34.0)
MCHC: 30.7 g/dL (ref 30.0–36.0)
MCV: 92.1 fL (ref 80.0–100.0)
Platelets: 309 10*3/uL (ref 150–400)
RBC: 3.79 MIL/uL — ABNORMAL LOW (ref 4.22–5.81)
RDW: 17.5 % — ABNORMAL HIGH (ref 11.5–15.5)
WBC: 10.1 10*3/uL (ref 4.0–10.5)
nRBC: 0 % (ref 0.0–0.2)

## 2020-09-22 LAB — COMPREHENSIVE METABOLIC PANEL
ALT: 21 U/L (ref 0–44)
AST: 14 U/L — ABNORMAL LOW (ref 15–41)
Albumin: 2.1 g/dL — ABNORMAL LOW (ref 3.5–5.0)
Alkaline Phosphatase: 223 U/L — ABNORMAL HIGH (ref 38–126)
Anion gap: 7 (ref 5–15)
BUN: 7 mg/dL — ABNORMAL LOW (ref 8–23)
CO2: 25 mmol/L (ref 22–32)
Calcium: 8.1 mg/dL — ABNORMAL LOW (ref 8.9–10.3)
Chloride: 105 mmol/L (ref 98–111)
Creatinine, Ser: 0.81 mg/dL (ref 0.61–1.24)
GFR, Estimated: >60 mL/min (ref >60)
Glucose, Bld: 99 mg/dL (ref 70–99)
Potassium: 3.1 mmol/L — ABNORMAL LOW (ref 3.5–5.1)
Sodium: 137 mmol/L (ref 135–145)
Total Bilirubin: 1 mg/dL (ref 0.3–1.2)
Total Protein: 5.4 g/dL — ABNORMAL LOW (ref 6.5–8.1)

## 2020-09-22 MED ORDER — POTASSIUM CHLORIDE CRYS ER 20 MEQ PO TBCR
40.0000 meq | EXTENDED_RELEASE_TABLET | Freq: Once | ORAL | Status: AC
  Administered 2020-09-22: 40 meq via ORAL
  Filled 2020-09-22: qty 2

## 2020-09-22 MED ORDER — AMOXICILLIN-POT CLAVULANATE 875-125 MG PO TABS: 1.0000 | ORAL_TABLET | Freq: Two times a day (BID) | ORAL | 0 refills | Status: DC

## 2020-09-22 MED ORDER — HYDROCODONE-ACETAMINOPHEN 5-325 MG PO TABS: 1.0000 | ORAL_TABLET | ORAL | 0 refills | Status: DC | PRN

## 2020-09-22 NOTE — Discharge Instructions (Signed)
If your drain output changes to green or increases significantly in output, you need to call our office ASAP.  Please keep track of how much output you are having each day  [facility]  Please arrive at least 30 min before your appointment to complete your check in paperwork.  If you are unable to arrive 30 min prior to your appointment time we may have to cancel or reschedule you. LAPAROSCOPIC SURGERY: POST OP INSTRUCTIONS Always review your discharge instruction sheet given to you by the facility where your surgery was performed. IF YOU HAVE DISABILITY OR FAMILY LEAVE FORMS, YOU MUST BRING THEM TO THE OFFICE FOR PROCESSING.   DO NOT GIVE THEM TO YOUR DOCTOR.  PAIN CONTROL  1. First take acetaminophen (Tylenol) AND/or ibuprofen (Advil) to control your pain after surgery.  Follow directions on package.  Taking acetaminophen (Tylenol) and/or ibuprofen (Advil) regularly after surgery will help to control your pain and lower the amount of prescription pain medication you may need.  You should not take more than 4,000 mg (4 grams) of acetaminophen (Tylenol) in 24 hours.  You should not take ibuprofen (Advil), aleve, motrin, naprosyn or other NSAIDS if you have a history of stomach ulcers or chronic kidney disease.  2. A prescription for pain medication may be given to you upon discharge.  Take your pain medication as prescribed, if you still have uncontrolled pain after taking acetaminophen (Tylenol) or ibuprofen (Advil). 3. Use ice packs to help control pain. 4. If you need a refill on your pain medication, please contact your pharmacy.  They will contact our office to request authorization. Prescriptions will not be filled after 5pm or on week-ends.  HOME MEDICATIONS 5. Take your usually prescribed medications unless otherwise directed.  DIET 6. You should follow a light diet the first few days after arrival home.  Be sure to include lots of fluids daily. Avoid fatty, fried  foods.   CONSTIPATION 7. It is common to experience some constipation after surgery and if you are taking pain medication.  Increasing fluid intake and taking a stool softener (such as Colace) will usually help or prevent this problem from occurring.  A mild laxative (Milk of Magnesia or Miralax) should be taken according to package instructions if there are no bowel movements after 48 hours.  WOUND/INCISION CARE 8. Most patients will experience some swelling and bruising in the area of the incisions.  Ice packs will help.  Swelling and bruising can take several days to resolve.  9. Unless discharge instructions indicate otherwise, follow guidelines below  a. STERI-STRIPS - you may remove your outer bandages 48 hours after surgery, and you may shower at that time.  You have steri-strips (small skin tapes) in place directly over the incision.  These strips should be left on the skin for 7-10 days.   b. DERMABOND/SKIN GLUE - you may shower in 24 hours.  The glue will flake off over the next 2-3 weeks. 10. Any sutures or staples will be removed at the office during your follow-up visit.  ACTIVITIES 11. You may resume regular (light) daily activities beginning the next day--such as daily self-care, walking, climbing stairs--gradually increasing activities as tolerated.  You may have sexual intercourse when it is comfortable.  Refrain from any heavy lifting or straining until approved by your doctor. a. You may drive when you are no longer taking prescription pain medication, you can comfortably wear a seatbelt, and you can safely maneuver your car and  apply brakes.  FOLLOW-UP 12. You should see your doctor in the office for a follow-up appointment approximately 2-3 weeks after your surgery.  You should have been given your post-op/follow-up appointment when your surgery was scheduled.  If you did not receive a post-op/follow-up appointment, make sure that you call for this appointment within a day or  two after you arrive home to insure a convenient appointment time.   WHEN TO CALL YOUR DOCTOR: 1. Fever over 101.0 2. Inability to urinate 3. Continued bleeding from incision. 4. Increased pain, redness, or drainage from the incision. 5. Increasing abdominal pain  The clinic staff is available to answer your questions during regular business hours.  Please dont hesitate to call and ask to speak to one of the nurses for clinical concerns.  If you have a medical emergency, go to the nearest emergency room or call 911.  A surgeon from [facility] is always on call at the hospital. [address], [address], [place], [place]  [address] ? [address] [address], [place], [place]   [address] [phone] ? [phone] ? FAX [phone]

## 2020-09-22 NOTE — Care Management Important Message (Signed)
Important Message  Patient Details IM Letter given to the Patient. Name: MINER KORAL MRN: [id number] Date of Birth: 1951-06-26   Medicare Important Message Given:  Yes     [name] 09/22/2020, 11:37 AM

## 2020-09-22 NOTE — Progress Notes (Signed)
4 Days Post-Op  Subjective: Patient feels great this morning.  Tolerating solid diet.  Pain is well controlled.  Moving his bowels.  Diarrhea has improved and is now solid.  ROS: See above, otherwise other systems negative  Objective: Vital signs in last 24 hours: Temp:  [97.7 F (36.5 C)-98.2 F (36.8 C)] 98 F (36.7 C) (11/15 0834) Pulse Rate:  [81-87] 87 (11/15 0834) Resp:  [18-20] 18 (11/15 0501) BP: (131-134)/(68-81) 134/68 (11/15 0834) SpO2:  [94 %-97 %] 97 % (11/15 0834) Weight:  [110.4 kg] 110.4 kg (11/15 0453) Last BM Date: 09/21/20  Intake/Output from previous day: 11/14 0701 - 11/15 0700 In: 1075.2 [P.O.:840; IV Piggyback:235.2] Out: 605 [Urine:600; Drains:5] Intake/Output this shift: Total I/O In: 360 [P.O.:360] Out: 100 [Urine:100]  PE: Abd: soft, appropriately tender, +BS, ND, incisions c/d/i.  Drain in place with minimal output and appeared serous.  Does not appear bilious today  Lab Results:  Recent Labs    09/21/20 0532 09/22/20 0434  WBC 11.9* 10.1  HGB 10.3* 10.7*  HCT 34.8* 34.9*  PLT 295 309   BMET Recent Labs    09/21/20 0532 09/22/20 0434  NA 135 137  K 3.2* 3.1*  CL 104 105  CO2 23 25  GLUCOSE 99 99  BUN 9 7*  CREATININE 0.99 0.81  CALCIUM 7.9* 8.1*   PT/INR No results for input(s): LABPROT, INR in the last 72 hours. CMP     Component Value Date/Time   NA 137 09/22/2020 0434   NA 140 08/12/2020 0926   K 3.1 (L) 09/22/2020 0434   CL 105 09/22/2020 0434   CO2 25 09/22/2020 0434   GLUCOSE 99 09/22/2020 0434   BUN 7 (L) 09/22/2020 0434   BUN 18 08/12/2020 0926   CREATININE 0.81 09/22/2020 0434   CALCIUM 8.1 (L) 09/22/2020 0434   PROT 5.4 (L) 09/22/2020 0434   PROT 6.5 12/13/2019 0936   ALBUMIN 2.1 (L) 09/22/2020 0434   ALBUMIN 4.1 12/13/2019 0936   AST 14 (L) 09/22/2020 0434   ALT 21 09/22/2020 0434   ALKPHOS 223 (H) 09/22/2020 0434   BILITOT 1.0 09/22/2020 0434   BILITOT 1.8 (H) 12/13/2019 0936   GFRNONAA >60  09/22/2020 0434   GFRAA 97 08/12/2020 0926   Lipase     Component Value Date/Time   LIPASE 40 09/18/2020 0439       Studies/Results: No results found.  Anti-infectives: Anti-infectives (From admission, onward)   Start     Dose/Rate Route Frequency Ordered Stop   09/22/20 0000  amoxicillin-clavulanate (AUGMENTIN) 875-125 MG tablet        1 tablet Oral 2 times daily 09/22/20 1051 09/26/20 2359   09/14/20 2200  piperacillin-tazobactam (ZOSYN) IVPB 3.375 g        3.375 g 12.5 mL/hr over 240 Minutes Intravenous Every 8 hours 09/14/20 2055     09/14/20 1600  piperacillin-tazobactam (ZOSYN) IVPB 3.375 g        3.375 g 100 mL/hr over 30 Minutes Intravenous  Once 09/14/20 1558 09/14/20 1719       Assessment/Plan CAD- s/pCABGx 49/21/2021byDr. Z Atkins.  PAF-on Xareltoat home. Restarted 11/13  Hypertension Diarrhea- notes ongoing diarrhea x2 months. Per GI  Infarcted gallbladder with cholelithiasis Choledocholithiasis             S/p ERCP/sphincterotomy/balloon extraction biliary stones 09/17/2020 Dr. Claudette Head             POD 4, S/p laparoscopic partial cholecystectomy/drain placement 09/18/2020 Dr. Onalee Hua  [place]             WBC - 10 today, will need 7 days of abx post op             LFT's okay  Surgically stable for DC home.  Will need to go home with JP drain in place.  RN to teach patient how to manage this.  Follow up being arranged in our office  FEN: reg diet ID: Zosyn 11/7, augmentin at DC for 7 days total PQA:[id number]  Follow-up: [facility]   LOS: 8 days    Letha Cape , [facility] 09/22/2020, 10:53 AM Please see Amion for pager number during day hours 7:00am-4:30pm or 7:00am -11:30am on weekends

## 2020-09-22 NOTE — Progress Notes (Signed)
Physical Therapy Treatment Patient Details Name: [name] Rice MRN: [id number] DOB: 1951/03/07 Today's Date: 09/22/2020    History of Present Illness [name] Rice is a 69 y.o. male with a history of CAD s/p 4v CABG 07/29/2020, HTN, PAF s/p MAZE on xarelto who presented to the ED 11/7 with diarrhea and abdominal pain. Work up demonstrated enlarged common bile duct (80mm) with layering sludge for which GI and surgery were consulted. ERCP performed 09/17/2020 with successful extraction of CBD stone and sludge. Laparoscopic cholecystectomy was performed 11/11 by Dr. Ezzard Standing where gallbladder was found to be necrotic and severely inflammed requiring incomplete removal and placement of JP drain.    PT Comments    Pt ambulated in hallway without assistive device and appears mildly unsteady however no assist required and no overt LOB observed.  Pt hopeful for d/c home today.   Follow Up Recommendations  No PT follow up     Equipment Recommendations  None recommended by PT    Recommendations for Other Services       Precautions / Restrictions Precautions Precaution Comments: R JP drain Restrictions Weight Bearing Restrictions: No    Mobility  Bed Mobility Overal bed mobility: Modified Independent                Transfers Overall transfer level: Modified independent                  Ambulation/Gait Ambulation/Gait assistance: Supervision;Modified independent (Device/Increase time) Gait Distance (Feet): 100 Feet Assistive device: None Gait Pattern/deviations: Step-through pattern;Decreased stride length     General Gait Details: slightly unsteady however no overt LOB, pt does not want to need assistive device upon d/c, short standing rest break for breathing (c/o mask)   Stairs             Wheelchair Mobility    Modified Rankin (Stroke Patients Only)       Balance                                            Cognition  Arousal/Alertness: Awake/alert Behavior During Therapy: WFL for tasks assessed/performed Overall Cognitive Status: Within Functional Limits for tasks assessed                                        Exercises      General Comments        Pertinent Vitals/Pain Pain Assessment: No/denies pain    Home Living                      Prior Function            PT Goals (current goals can now be found in the care plan section) Progress towards PT goals: Progressing toward goals    Frequency    Min 3X/week      PT Plan Current plan remains appropriate    Co-evaluation              AM-PAC PT "6 Clicks" Mobility   Outcome Measure  Help needed turning from your back to your side while in a flat bed without using bedrails?: None Help needed moving from lying on your back to sitting on the side of a flat bed without using bedrails?: None Help needed moving  to and from a bed to a chair (including a wheelchair)?: A Little Help needed standing up from a chair using your arms (e.g., wheelchair or bedside chair)?: A Little Help needed to walk in hospital room?: A Little Help needed climbing 3-5 steps with a railing? : A Little 6 Click Score: 20    End of Session Equipment Utilized During Treatment: Gait belt Activity Tolerance: Patient tolerated treatment well Patient left: with call bell/phone within reach;in chair;with chair alarm set Nurse Communication: Mobility status PT Visit Diagnosis: Difficulty in walking, not elsewhere classified (R26.2)     Time: 1031-1040 PT Time Calculation (min) (ACUTE ONLY): 9 min  Charges:  $Gait Training: 8-22 mins                    Paulino Door, DPT Acute Rehabilitation Services Pager: [phone] Office: [phone]  Maida Sale E 09/22/2020, 12:11 PM

## 2020-09-22 NOTE — Progress Notes (Signed)
Provided and discussed discharge instructions (follow up information and appointments, jp drain care, signs and symptoms to look for to call MD ) with pt and his brother. Pt verbalized understanding and provided teach back demonstration regarding JP drain care and maintenance.  Val Eagle

## 2020-09-22 NOTE — Progress Notes (Signed)
Nutrition Follow-up  DOCUMENTATION CODES:   Not applicable  INTERVENTION:  - continue Boost Breeze TID.  NUTRITION DIAGNOSIS:   Increased nutrient needs related to poor appetite, diarrhea as evidenced by estimated needs. -ongoing  GOAL:   Patient will meet greater than or equal to 90% of their needs -unmet  MONITOR:   PO intake, Labs, Weight trends, Skin  ASSESSMENT:   69 y.o. male with medical history significant for coronary artery disease status post four-vessel CABG in September 2021, known left bundle branch block, hypertension, paroxysmal atrial fibrillation chronically anticoagulated on Xarelto, who is admitted to [facility] on 09/14/2020 as transfer from [facility] emergency department with sepsis due to suspected acute cholecystitis presenting from home to the latter facility complaining of diarrhea.  Diet advanced from NPO to CLD on 11/11 at 22 and to Heart Healthy on 11/12 at 1055. Patient consumed 50% of breakfast yesterday and 100% of breakfast this AM. No other intakes documented.  Boost Breeze ordered TID starting 11/9 at 1600. He has accepted this supplement 50-100% of the time offered.   Weight has been fluctuating throughout hospitalization. Mild pitting edema to BLE documented in the flow sheet.   Discharge order and discharge summary for discharge home entered earlier this AM.    Labs reviewed; K: 3.1 mmol/l, BUN: 7 mg/dl, Ca: 8.1 mg/dl, Alk Phos elevated.  Medications reviewed; 40 mEq Klor-Con x1 dose 11/15.   Diet Order:   Diet Order            Diet - low sodium heart healthy           Diet Heart Room service appropriate? Yes; Fluid consistency: Thin  Diet effective now                 EDUCATION NEEDS:   Education needs have been addressed  Skin:  Skin Assessment: Skin Integrity Issues: Skin Integrity Issues:: Incisions Incisions: chest and L arm (11/7)  Last BM:  11/14 (type 6 x2)  Height:   Ht Readings  from Last 1 Encounters:  09/18/20 _0  (1.88 m)    Weight:   Wt Readings from Last 1 Encounters:  09/22/20 110.4 kg     Estimated Nutritional Needs:  Kcal:  2500-2700 Protein:  115-125g Fluid:  2.5L/day     Jarome Matin, MS, RD, LDN, CNSC Inpatient Clinical Dietitian RD pager # available in [facility]  After hours/weekend pager # available in [facility]

## 2020-09-22 NOTE — Discharge Summary (Signed)
Physician Discharge Summary  [name] Rice XHB:[id number] DOB: July 14, 1951 DOA: 09/14/2020  PCP: [name], PA-C  Admit date: 09/14/2020 Discharge date: 09/22/2020  Admitted From: Home Disposition: Home   Recommendations for Outpatient Follow-up:  1. Follow up with PCP in 1-2 weeks 2. Follow up with general surgery, continue JP drain care.  3. Consider further evaluation of left carotid artery stenosis.   Home Health: RN Equipment/Devices: RUQ JP drain Discharge Condition: Stable CODE STATUS: Full Diet recommendation: Heart healthy  Brief/Interim Summary: [name] Widmayer Wilsonis a 69 y.o.malewith a history of CAD s/p 4v CABG 07/29/2020, HTN, PAF s/p MAZE on xarelto who presented to the ED 11/7 with diarrhea and abdominal pain. Work up demonstrated enlarged common bile duct (37mm) with layering sludge for which GI and surgery were consulted. ERCP performed 09/17/2020 with successful extraction of CBD stone and sludge. Laparoscopic cholecystectomy was performed 11/11 by Dr. Ezzard Standing where gallbladder was found to be necrotic and severely inflammed requiring incomplete removal and placement of JP drain. Zosyn continues with plans to transition to augmentin per surgery for 7 days antibiotics post-op. Xarelto restarted 11/13 per surgery clearance. JP drain output has decreased, abdominal pain resolved, and he's taking adequate per oral intake. He has been cleared by surgery for discharge.   Discharge Diagnoses:  Principal Problem:   Acute cholecystitis Active Problems:   Atrial fibrillation (HCC)   Pure hypercholesterolemia   Hypokalemia   CAD, multiple vessel   AKI (acute kidney injury) (HCC)   Sepsis (HCC)   Abdominal pain   Abnormal CT of the abdomen   Abnormal liver enzymes   Choledocholithiasis with acute cholecystitis   Abnormal magnetic resonance cholangiopancreatography (MRCP)   Common bile duct (CBD) obstruction   Elevated LFTs   Benign hypertension  Choledocholithiasis:  s/p removal of stones and sludge by biliary sphincterotomy and balloon extraction at ERCP 11/10. Reactive changes without dysplasia or malignancy identified on pathology from biopsy of abnormal mucosa at ampullary orifice. LFTs significantly improved. - Avoid aspirin, NSAIDs x1 week.   Cholelithiasis, infarcted gallbladder s/p laparoscopic partial cholecystectomy 11/11 with JP drain placement in GB fossa:  - Continued IV antibiotics 11/7 - 11/15, WBC normalized and has remained afebrile. Per general surgery, no further antibiotics are required.  - Continue JP drain at discharge, to be followed up at [facility].  Diarrhea: Unclear etiology. CDiff negative. - Follow with GI if symptoms recur. Overall improving.  Hypokalemia:  - Recheck at follow up and supplement as indicated.  AKI: Post-ERCP. Improved and continues improvement off IVF. - Avoid hypotension and nephrotoxins.  Persistent atrial fibrillation s/p biatrial MAZE, LAA clipping:  - Restarted xarelto.  - Continue BB, rate is controlled.   CAD s/p CABG x4 September 2021, LBBB, HTN, HLD: No angina or heart failure symptoms.  - Continue metoprolol, statin, norvasc - Restart aspirin 10/18 per GI recommendations.  - Per cardiology, pt's CV risk is low-moderate.   Left carotid stenosis:  - Outpatient follow up recommended.   LFT elevation:  - Monitoring. Improved. Alkaline phosphatase is only persistent elevation, though this continues to trend toward normal every day.  Obesity: Estimated body mass index is 31.25 kg/m as calculated from the following:   Height as of this encounter: 6\' 2"  (1.88 m).   Weight as of this encounter: 110.4 kg.  Discharge Instructions Discharge Instructions    Diet - low sodium heart healthy   Complete by: As directed    Discharge instructions   Complete by: As directed  You have been cleared for discharge by GI and surgery, and will need to follow these  recommendations:  - You will continue antibiotics (augmentin sent to your pharmacy), though should seek medical attention if you notice fever, worsening nausea, vomiting, or abdominal pain.  - Do not restart taking aspirin until Thursday this week. - Continue wound care for the JP drain and draining it per general surgery's recommendations. Follow up at [facility].   Discharge wound care:   Complete by: As directed    Per general surgery   Face-to-face encounter (required for Medicare/Medicaid patients)   Complete by: As directed    I Tyrone Nine certify that this patient is under my care and that I, or a nurse practitioner or physician's assistant working with me, had a face-to-face encounter that meets the physician face-to-face encounter requirements with this patient on 09/22/2020. The encounter with the patient was in whole, or in part for the following medical condition(s) which is the primary reason for home health care (List medical condition): Cholidocholithiasis, infarcted gallbladder s/p biliary drain placement   The encounter with the patient was in whole, or in part, for the following medical condition, which is the primary reason for home health care: Cholidocholithiasis, infarcted gallbladder s/p biliary drain placement   I certify that, based on my findings, the following services are medically necessary home health services: Nursing   Reason for Medically Necessary Home Health Services: Skilled Nursing- Post-Surgical Wound Assessment and Care   My clinical findings support the need for the above services: Unable to leave home safely without assistance and/or assistive device   Further, I certify that my clinical findings support that this patient is homebound due to: Unable to leave home safely without assistance   For home use only DME Walker rolling   Complete by: As directed    Walker: With 5 Inch Wheels   Patient needs a walker to treat with the following  condition: Gait instability   Home Health   Complete by: As directed    To provide the following care/treatments: RN   Increase activity slowly   Complete by: As directed      Allergies as of 09/22/2020   No Known Allergies     Medication List    TAKE these medications   acetaminophen 500 MG tablet Commonly known as: TYLENOL Take 1,000 mg by mouth every 6 (six) hours as needed for headache (pain).   amLODipine 10 MG tablet Commonly known as: NORVASC TAKE 1 TABLET BY MOUTH  DAILY What changed:   how much to take  when to take this   amoxicillin-clavulanate 875-125 MG tablet Commonly known as: Augmentin Take 1 tablet by mouth 2 (two) times daily for 4 days.   Ensure Take 1 Can by mouth See admin instructions. Drink 1 can of VANILLA ENSURE by mouth once a day   loperamide 2 MG tablet Commonly known as: IMODIUM A-D Take 2 mg by mouth 4 (four) times daily as needed for diarrhea or loose stools.   metoprolol tartrate 25 MG tablet Commonly known as: LOPRESSOR Take 0.5 tablets (12.5 mg total) by mouth daily. What changed: when to take this   olmesartan 40 MG tablet Commonly known as: BENICAR Take 40 mg by mouth in the morning.   rosuvastatin 40 MG tablet Commonly known as: CRESTOR Take 1 tablet (40 mg total) by mouth daily. What changed: when to take this   Xarelto 20 MG Tabs tablet Generic drug: rivaroxaban TAKE  1 TABLET BY MOUTH IN  THE EVENING AFTER DINNER What changed: See the new instructions.            Durable Medical Equipment  (From admission, onward)         Start     Ordered   09/22/20 0000  For home use only DME Walker rolling       Question Answer Comment  Walker: With 5 Inch Wheels   Patient needs a walker to treat with the following condition Gait instability      09/22/20 1051           Discharge Care Instructions  (From admission, onward)         Start     Ordered   09/22/20 0000  Discharge wound care:       Comments: Per  general surgery   09/22/20 1051          Follow-up Information    Richmond CampbellKaplan, Kristen W., PA-C. Schedule an appointment as soon as possible for a visit in 1 week(s).   Specialty: Family Medicine Contact information: [address] [place] [address] [phone]        Ovidio KinNewman, David, MD Follow up.   Specialty: General Surgery Why: for post operative evaluation and JP drain assessment Contact information: [address] [place] [id number] [phone]              No Known Allergies  Consultations:  GI  General surgery  Procedures/Studies: DG Chest 2 View  Result Date: 09/01/2020 CLINICAL DATA:  Previous coronary bypass, pleural effusion EXAM: CHEST - 2 VIEW COMPARISON:  08/11/2020 FINDINGS: Previous coronary bypass changes noted. Similar small to moderate left pleural effusion and trace right effusion. Minor associated bibasilar atelectasis, worse on the left. Evidence of remote granulomatous disease bilaterally with scattered punctate granulomata. Stable heart size and vascularity. No pneumothorax. IMPRESSION: Similar persistent small to moderate left effusion with left base atelectasis. Trace right effusion. Remote granulomatous disease. Electronically Signed   By: Judie PetitM.  Shick M.D.   On: 09/01/2020 11:20   CT Abdomen Pelvis W Contrast  Result Date: 09/14/2020 CLINICAL DATA:  Diarrhea after surgery, fatigue, weakness, history atrial fibrillation, coronary artery disease, hypertension, post CABG and Maze procedure on 07/29/2020 EXAM: CT ABDOMEN AND PELVIS WITH CONTRAST TECHNIQUE: Multidetector CT imaging of the abdomen and pelvis was performed using the standard protocol following bolus administration of intravenous contrast. Sagittal and coronal MPR images reconstructed from axial data set. CONTRAST:  100mL OMNIPAQUE IOHEXOL 300 MG/ML SOLN IV. No oral contrast. COMPARISON:  07/16/2017 FINDINGS: Lower chest: Small to moderate LEFT pleural effusion with  partial atelectasis of LEFT lower lobe. Minimal RIGHT pleural effusion and basilar atelectasis dependently. Calcified granuloma RIGHT lower lobe. Hepatobiliary: Distended gallbladder with wall thickening and pericholecystic infiltrative changes concerning for acute cholecystitis. No definite calcified gallstones. Dilated CBD up to 19 mm diameter to ampulla. No definite calcification at duodenal junction though questionable wall thickening at the ampulla is present. Intrahepatic biliary dilatation present. No focal hepatic mass lesion. Pancreas: No focal abnormalities Spleen: Normal appearance Adrenals/Urinary Tract: 5 mm calculus at central LEFT kidney. Cortical scarring LEFT kidney. Adrenal glands, kidneys, and ureters otherwise normal appearance. Bladder contains small amount of urine and extends into a RIGHT inguinal hernia. Stomach/Bowel: Minimal colonic diverticulosis without evidence of diverticulitis. Normal appendix. Stomach decompressed containing radiodense material question medication. Remaining bowel loops unremarkable. Vascular/Lymphatic: Atherosclerotic calcifications aorta and iliac arteries without aneurysm. No adenopathy Reproductive: Unremarkable prostate  gland and seminal vesicles. Other: RIGHT inguinal hernia containing fat and portion of the urinary bladder. No bowel herniation. No additional hernia is identified. No free air or free fluid. Musculoskeletal: Osseous demineralization. IMPRESSION: Distended gallbladder with wall thickening and pericholecystic infiltrative changes concerning for acute cholecystitis. Dilated CBD up to 19 mm diameter with intrahepatic biliary dilatation, recommend correlation with LFTs; cause of biliary dilatation is uncertain, without calcification evident, could be due to noncalcified stone, mass, or stricture. This could be assessed by MRCP imaging for further evaluation. RIGHT inguinal hernia containing fat and a portion of the urinary bladder. 5 minutes mm  nonobstructing LEFT renal calculus. Small to moderate LEFT and minimal RIGHT pleural effusions with bibasilar atelectasis greater on LEFT. Minimal colonic diverticulosis without evidence of diverticulitis. Aortic Atherosclerosis (ICD10-I70.0). Electronically Signed   By: Ulyses Southward M.D.   On: 09/14/2020 15:17   MR 3D Recon At Scanner  Result Date: 09/16/2020 CLINICAL DATA:  Elevated liver function studies and biliary dilatation on CT scan. EXAM: MRI ABDOMEN WITHOUT AND WITH CONTRAST (INCLUDING MRCP) TECHNIQUE: Multiplanar multisequence MR imaging of the abdomen was performed both before and after the administration of intravenous contrast. Heavily T2-weighted images of the biliary and pancreatic ducts were obtained, and three-dimensional MRCP images were rendered by post processing. CONTRAST:  10mL GADAVIST GADOBUTROL 1 MMOL/ML IV SOLN COMPARISON:  CT scan 09/14/2020 and ultrasound 09/04/2020 FINDINGS: Examination limited due to breathing motion artifact. Lower chest: There is a left pleural effusion and left lower lobe infiltrate or atelectasis. No pericardial effusion. Hepatobiliary: Moderate intrahepatic biliary dilatation and significant common bile duct dilatation measuring up to 19 mm. The gallbladder demonstrates distension and there is layering sludge in the gallbladder and possible tiny stones. Similar findings are demonstrated in the common bile duct with layering sludge. No large obstructing common bile duct stone is identified. No hepatic lesions. The portal and hepatic veins are patent. Marked wall thickening suggesting acute cholecystitis. Pancreas:  No mass, inflammation or ductal dilatation. Spleen:  Normal size. No focal lesions. Adrenals/Urinary Tract: The adrenal glands and kidneys are unremarkable. No renal lesions are hydronephrosis. Stomach/Bowel: The stomach, duodenum, visualized small bowel and visualized colon are grossly normal. Vascular/Lymphatic: Age advanced atherosclerotic  calcifications involving the aorta but no aneurysm or dissection. The branch vessels are patent. No mesenteric or retroperitoneal mass or adenopathy. Other: No ascites or abdominal wall hernia. Small scattered upper abdominal lymph nodes. Musculoskeletal: No significant bony findings. IMPRESSION: 1. Moderate intrahepatic biliary dilatation and significant common bile duct dilatation measuring up to 19 mm. No large obstructing common bile duct stone is identified. There is layering sludge in the common bile duct. No ampullary mass or pancreatic head mass. 2. Distended gallbladder with marked gallbladder wall thickening suggesting acute cholecystitis. Layering sludge and possible tiny stones. 3. Left pleural effusion and left lower lobe infiltrate or atelectasis. Electronically Signed   By: Rudie Meyer M.D.   On: 09/16/2020 05:10   US Abdomen Limited  Result Date: 09/04/2020 CLINICAL DATA:  69 year old male with epigastric pain. Elevated LFTs. EXAM: ULTRASOUND ABDOMEN LIMITED RIGHT UPPER QUADRANT COMPARISON:  Right upper quadrant ultrasound dated 07/16/2017. FINDINGS: Gallbladder: There is layering sludge or small stones within the gallbladder. No gallbladder wall thickening or pericholecystic fluid. Negative sonographic Murphy's sign. Common bile duct: Diameter: 6 mm Liver: There is diffuse increased liver echogenicity most commonly seen in the setting of fatty infiltration. Superimposed inflammation or fibrosis is not excluded. Clinical correlation is recommended. There is a 2.0 x 1.6 x  1.7 cm echogenic lesion in the anterior liver which is not characterized on this ultrasound. No internal vascularity noted within this lesion on color Doppler. This may represent an area of increased fat deposit. Repeat ultrasound in 3 months or further characterization with MRI without and with contrast on a nonemergent/outpatient basis is advised. Portal vein is patent on color Doppler imaging with normal direction of blood  flow towards the liver. Other: None. IMPRESSION: 1. Gallbladder sludge. No sonographic findings of acute cholecystitis. 2. Fatty liver. 3. Indeterminate echogenic lesion in the anterior liver as above. Follow-up recommended. Electronically Signed   By: Elgie Collard M.D.   On: 09/04/2020 20:00   DG Chest Port 1 View  Result Date: 09/14/2020 CLINICAL DATA:  Acute cholecystitis, diarrhea for 2 months, abdominal distension EXAM: PORTABLE CHEST 1 VIEW COMPARISON:  09/01/2020 FINDINGS: Single frontal view of the chest demonstrates a stable cardiac silhouette. Chronic left basilar consolidation and effusion, stable. Right chest is clear. No pneumothorax. Chronic postsurgical changes from median sternotomy. IMPRESSION: 1. Persistent left basilar consolidation and effusion. Electronically Signed   By: Sharlet Salina M.D.   On: 09/14/2020 16:41   DG ERCP BILIARY & PANCREATIC DUCTS  Result Date: 09/17/2020 CLINICAL DATA:  69 year old male with a history elevated LFTs EXAM: ERCP TECHNIQUE: Multiple spot images obtained with the fluoroscopic device and submitted for interpretation post-procedure. FLUOROSCOPY TIME:  Fluoroscopy Time:  3 minutes 54 seconds COMPARISON:  MR 09/15/2020 FINDINGS: Limited intraoperative fluoroscopic spot images during ERCP. Initial image demonstrates the endoscope projecting over the upper abdomen. Subsequently there is cannulation of the ampulla with a safety wire and retrograde infusion of contrast. Vague filling defects in the distal common bile duct. Deployment of a retrieval balloon. IMPRESSION: Limited images during ERCP demonstrates partial opacification of the extrahepatic biliary ducts with deployment of a retrieval balloon. Please refer to the dictated operative report for full details of intraoperative findings and procedure. Electronically Signed   By: Gilmer Mor D.O.   On: 09/17/2020 15:11   MR ABDOMEN MRCP W WO CONTAST  Result Date: 09/16/2020 CLINICAL DATA:  Elevated  liver function studies and biliary dilatation on CT scan. EXAM: MRI ABDOMEN WITHOUT AND WITH CONTRAST (INCLUDING MRCP) TECHNIQUE: Multiplanar multisequence MR imaging of the abdomen was performed both before and after the administration of intravenous contrast. Heavily T2-weighted images of the biliary and pancreatic ducts were obtained, and three-dimensional MRCP images were rendered by post processing. CONTRAST:  40mL GADAVIST GADOBUTROL 1 MMOL/ML IV SOLN COMPARISON:  CT scan 09/14/2020 and ultrasound 09/04/2020 FINDINGS: Examination limited due to breathing motion artifact. Lower chest: There is a left pleural effusion and left lower lobe infiltrate or atelectasis. No pericardial effusion. Hepatobiliary: Moderate intrahepatic biliary dilatation and significant common bile duct dilatation measuring up to 19 mm. The gallbladder demonstrates distension and there is layering sludge in the gallbladder and possible tiny stones. Similar findings are demonstrated in the common bile duct with layering sludge. No large obstructing common bile duct stone is identified. No hepatic lesions. The portal and hepatic veins are patent. Marked wall thickening suggesting acute cholecystitis. Pancreas:  No mass, inflammation or ductal dilatation. Spleen:  Normal size. No focal lesions. Adrenals/Urinary Tract: The adrenal glands and kidneys are unremarkable. No renal lesions are hydronephrosis. Stomach/Bowel: The stomach, duodenum, visualized small bowel and visualized colon are grossly normal. Vascular/Lymphatic: Age advanced atherosclerotic calcifications involving the aorta but no aneurysm or dissection. The branch vessels are patent. No mesenteric or retroperitoneal mass or adenopathy. Other: No ascites  or abdominal wall hernia. Small scattered upper abdominal lymph nodes. Musculoskeletal: No significant bony findings. IMPRESSION: 1. Moderate intrahepatic biliary dilatation and significant common bile duct dilatation measuring up  to 19 mm. No large obstructing common bile duct stone is identified. There is layering sludge in the common bile duct. No ampullary mass or pancreatic head mass. 2. Distended gallbladder with marked gallbladder wall thickening suggesting acute cholecystitis. Layering sludge and possible tiny stones. 3. Left pleural effusion and left lower lobe infiltrate or atelectasis. Electronically Signed   By: Rudie Meyer M.D.   On: 09/16/2020 05:10     ERCP 09/17/2020 Dr. [name]:  Impression: - Bulging ampulla which decompressed post  sphinterotomy and CBD clearing. - The common bile duct was dilated, with sludge and  stones causing an obstruction. - Choledocholithiasis and a large volume of sludge  was found in the CBD. Complete removal was  accomplished by biliary sphincterotomy and balloon  extraction. - A biliary sphincterotomy was performed. - Area of abnormal mucosa at the ampulla orifice,  along the sphincterotomy margins was evident after  sphincterotomy. Biopsied. Recommendation: Avoid aspirin and nonsteroidal anti-inflammatory  medicines for 1 week. - Observe patient's clinical course following  today's ERCP with therapeutic intervention. - Trend LFTs. - Await path results. - Pending pathology results and clinical course  further evaluation with EUS may be indicated. - Proceed with cholecystectomy per general surgery. - OK to resume Xarelto in 3 days as indicated   however discuss timing with general surgery  regarding scheduling of cholecystectomy.   09/18/20 LAPAROSCOPIC PARTIAL CHOLECYSTECTOMY Ovidio Kin, MD    Subjective: Feels well, eating better without nausea, vomiting or abdominal pain, diarrhea seems to have resolved as well. No shortness of breath, chest pain, ready to go home, has a lot of support.  Discharge Exam: Vitals:   09/22/20 0501 09/22/20 0834  BP: 131/81 134/68  Pulse: 81 87  Resp: 18   Temp: 98.2 F (36.8 C) 98 F (36.7 C)  SpO2: 94% 97%   General: Pt is alert, awake, not in acute distress Cardiovascular: RRR, S1/S2 +, no rubs, no gallops Respiratory: CTA bilaterally, no wheezing, no rhonchi Abdominal: Soft, NT, ND, bowel sounds +. Incision sites c/d/i, minimal serous discharge in JP drain, drained 5cc/24hrs. Extremities: No edema, no cyanosis  Labs: BNP (last 3 results) Recent Labs    07/24/20 0748  BNP 206.5*   Basic Metabolic Panel: Recent Labs  Lab 09/17/20 0441 09/17/20 1030 09/18/20 0439 09/19/20 0831 09/20/20 0559 09/21/20 0532 09/22/20 0434  NA 135  --  138 137 136 135 137  K 3.1*   < > 4.7 4.4 3.9 3.2* 3.1*  CL 104  --  108 106 106 104 105  CO2 24  --  23 20* 21* 23 25  GLUCOSE 104*  --  150* 101* 99 99 99  BUN 8  --  7*  CREATININE 1.01  --  1.44* 1.23 1.04 0.99 0.81  CALCIUM 7.9*  --  8.0* 8.2* 8.1* 7.9* 8.1*  MG 1.9  --  1.7  --   --   --   --    < > = values in this interval not displayed.   Liver Function Tests: Recent Labs  Lab 09/18/20 0439 09/19/20 0831 09/20/20 0559 09/21/20 0532 09/22/20 0434  AST 26 34 21 16 14*  ALT 40 43 32 24 21  ALKPHOS 543* 451* 338* 259* 223*  BILITOT 0.7 1.3* 1.1 0.9 1.0  PROT  6.3* 6.5 6.0* 5.5* 5.4*  ALBUMIN 2.4* 2.6* 2.3* 2.1* 2.1*   Recent Labs  Lab 09/18/20 0439  LIPASE 40   CBC: Recent Labs  Lab 09/18/20 0439 09/19/20 0831 09/20/20 0559 09/21/20 0532  09/22/20 0434  WBC 5.8 17.1* 13.7* 11.9* 10.1  HGB 11.2* 11.4* 11.2* 10.3* 10.7*  HCT 37.9* 39.8 38.0* 34.8* 34.9*  MCV 95.9 98.0 95.7 96.1 92.1  PLT 314 361 324 295 309   Urinalysis    Component Value Date/Time   COLORURINE BROWN (A) 09/14/2020 1720   APPEARANCEUR CLOUDY (A) 09/14/2020 1720   LABSPEC <1.005 (L) 09/14/2020 1720   PHURINE 5.5 09/14/2020 1720   GLUCOSEU NEGATIVE 09/14/2020 1720   HGBUR LARGE (A) 09/14/2020 1720   BILIRUBINUR MODERATE (A) 09/14/2020 1720   KETONESUR 15 (A) 09/14/2020 1720   PROTEINUR 30 (A) 09/14/2020 1720   NITRITE NEGATIVE 09/14/2020 1720   LEUKOCYTESUR NEGATIVE 09/14/2020 1720    Microbiology Recent Results (from the past 240 hour(s))  Culture, blood (routine x 2)     Status: None   Collection Time: 09/14/20 12:00 PM   Specimen: BLOOD RIGHT ARM  Result Value Ref Range Status   Specimen Description   Final    BLOOD RIGHT ARM Performed at [facility], [address] [place], [place] [address]    Special Requests   Final    BOTTLES DRAWN AEROBIC AND ANAEROBIC Blood Culture adequate volume Performed at [facility], [address] [place], [place] [address]    Culture   Final    NO GROWTH 5 DAYS Performed at [facility], [address], [place], [place] [address]    Report Status 09/19/2020 FINAL  Final  C Difficile Quick Screen w PCR reflex     Status: None   Collection Time: 09/14/20  2:20 PM   Specimen: STOOL  Result Value Ref Range Status   C Diff antigen NEGATIVE NEGATIVE Final   C Diff toxin NEGATIVE NEGATIVE Final   C Diff interpretation No C. difficile detected.  Final    Comment: Performed at [facility], [address], [place], [place] [address]  Respiratory Panel by RT PCR (Flu A&B, Covid) - Nasopharyngeal Swab     Status: None   Collection Time: 09/14/20  4:04 PM   Specimen: Nasopharyngeal Swab  Result Value Ref Range Status   SARS Coronavirus 2 by RT PCR NEGATIVE NEGATIVE Final     Comment: (NOTE) SARS-CoV-2 target nucleic acids are NOT DETECTED.  The SARS-CoV-2 RNA is generally detectable in upper respiratoy specimens during the acute phase of infection. The lowest concentration of SARS-CoV-2 viral copies this assay can detect is 131 copies/mL. A negative result does not preclude SARS-Cov-2 infection and should not be used as the sole basis for treatment or other patient management decisions. A negative result may occur with  improper specimen collection/handling, submission of specimen other than nasopharyngeal swab, presence of viral mutation(s) within the areas targeted by this assay, and inadequate number of viral copies (<131 copies/mL). A negative result must be combined with clinical observations, patient history, and epidemiological information. The expected result is Negative.  Fact Sheet for Patients:  https://www.moore.com/  Fact Sheet for Healthcare Providers:  https://www.young.biz/  This test is no t yet approved or cleared by the [place] FDA and  has been authorized for detection and/or diagnosis of SARS-CoV-2 by FDA under an Emergency Use Authorization (EUA). This EUA will remain  in effect (  meaning this test can be used) for the duration of the COVID-19 declaration under Section 564(b)(1) of the Act, 21 U.S.C. section 360bbb-3(b)(1), unless the authorization is terminated or revoked sooner.     Influenza A by PCR NEGATIVE NEGATIVE Final   Influenza B by PCR NEGATIVE NEGATIVE Final    Comment: (NOTE) The Xpert Xpress SARS-CoV-2/FLU/RSV assay is intended as an aid in  the diagnosis of influenza from Nasopharyngeal swab specimens and  should not be used as a sole basis for treatment. Nasal washings and  aspirates are unacceptable for Xpert Xpress SARS-CoV-2/FLU/RSV  testing.  Fact Sheet for Patients: https://www.moore.com/  Fact Sheet for Healthcare  Providers: https://www.young.biz/  This test is not yet approved or cleared by the [place] FDA and  has been authorized for detection and/or diagnosis of SARS-CoV-2 by  FDA under an Emergency Use Authorization (EUA). This EUA will remain  in effect (meaning this test can be used) for the duration of the  Covid-19 declaration under Section 564(b)(1) of the Act, 21  U.S.C. section 360bbb-3(b)(1), unless the authorization is  terminated or revoked. Performed at [facility], [address] [place], [place] [address]     Time coordinating discharge: Approximately 40 minutes  Tyrone Nine, MD  [facility] 09/22/2020, 10:51 AM

## 2020-09-23 ENCOUNTER — Inpatient Hospital Stay (HOSPITAL_COMMUNITY)
Admission: EM | Admit: 2020-09-23 | Discharge: 2020-10-01 | DRG: 689 | Disposition: A | Discharge status: Skilled Nursing Facility | Payer: Medicare Other | Attending: Internal Medicine | Admitting: Internal Medicine

## 2020-09-23 ENCOUNTER — Emergency Department (HOSPITAL_COMMUNITY): Payer: Medicare Other

## 2020-09-23 ENCOUNTER — Encounter (HOSPITAL_COMMUNITY): Payer: Self-pay

## 2020-09-23 ENCOUNTER — Telehealth: Payer: Self-pay

## 2020-09-23 DIAGNOSIS — N200 Calculus of kidney: Secondary | ICD-10-CM | POA: Diagnosis not present

## 2020-09-23 DIAGNOSIS — N132 Hydronephrosis with renal and ureteral calculous obstruction: Secondary | ICD-10-CM | POA: Diagnosis not present

## 2020-09-23 DIAGNOSIS — I482 Chronic atrial fibrillation, unspecified: Secondary | ICD-10-CM | POA: Diagnosis present

## 2020-09-23 DIAGNOSIS — Z20822 Contact with and (suspected) exposure to covid-19: Secondary | ICD-10-CM | POA: Diagnosis present

## 2020-09-23 DIAGNOSIS — J90 Pleural effusion, not elsewhere classified: Secondary | ICD-10-CM

## 2020-09-23 DIAGNOSIS — N136 Pyonephrosis: Secondary | ICD-10-CM | POA: Diagnosis not present

## 2020-09-23 DIAGNOSIS — R0600 Dyspnea, unspecified: Secondary | ICD-10-CM

## 2020-09-23 DIAGNOSIS — Z8249 Family history of ischemic heart disease and other diseases of the circulatory system: Secondary | ICD-10-CM

## 2020-09-23 DIAGNOSIS — E785 Hyperlipidemia, unspecified: Secondary | ICD-10-CM | POA: Diagnosis present

## 2020-09-23 DIAGNOSIS — I2510 Atherosclerotic heart disease of native coronary artery without angina pectoris: Secondary | ICD-10-CM | POA: Diagnosis present

## 2020-09-23 DIAGNOSIS — I4891 Unspecified atrial fibrillation: Secondary | ICD-10-CM | POA: Diagnosis present

## 2020-09-23 DIAGNOSIS — R0602 Shortness of breath: Secondary | ICD-10-CM

## 2020-09-23 DIAGNOSIS — E871 Hypo-osmolality and hyponatremia: Secondary | ICD-10-CM | POA: Diagnosis not present

## 2020-09-23 DIAGNOSIS — E7800 Pure hypercholesterolemia, unspecified: Secondary | ICD-10-CM | POA: Diagnosis present

## 2020-09-23 DIAGNOSIS — Z951 Presence of aortocoronary bypass graft: Secondary | ICD-10-CM

## 2020-09-23 DIAGNOSIS — I447 Left bundle-branch block, unspecified: Secondary | ICD-10-CM | POA: Diagnosis present

## 2020-09-23 DIAGNOSIS — Z9889 Other specified postprocedural states: Secondary | ICD-10-CM

## 2020-09-23 DIAGNOSIS — B379 Candidiasis, unspecified: Secondary | ICD-10-CM | POA: Diagnosis present

## 2020-09-23 DIAGNOSIS — R531 Weakness: Secondary | ICD-10-CM

## 2020-09-23 DIAGNOSIS — I130 Hypertensive heart and chronic kidney disease with heart failure and stage 1 through stage 4 chronic kidney disease, or unspecified chronic kidney disease: Secondary | ICD-10-CM | POA: Diagnosis present

## 2020-09-23 DIAGNOSIS — R21 Rash and other nonspecific skin eruption: Secondary | ICD-10-CM | POA: Diagnosis not present

## 2020-09-23 DIAGNOSIS — I10 Essential (primary) hypertension: Secondary | ICD-10-CM | POA: Diagnosis present

## 2020-09-23 DIAGNOSIS — I4819 Other persistent atrial fibrillation: Secondary | ICD-10-CM | POA: Diagnosis present

## 2020-09-23 DIAGNOSIS — E440 Moderate protein-calorie malnutrition: Secondary | ICD-10-CM | POA: Diagnosis present

## 2020-09-23 DIAGNOSIS — N183 Chronic kidney disease, stage 3 unspecified: Secondary | ICD-10-CM | POA: Diagnosis present

## 2020-09-23 DIAGNOSIS — R112 Nausea with vomiting, unspecified: Secondary | ICD-10-CM | POA: Diagnosis not present

## 2020-09-23 DIAGNOSIS — I5043 Acute on chronic combined systolic (congestive) and diastolic (congestive) heart failure: Secondary | ICD-10-CM | POA: Diagnosis present

## 2020-09-23 DIAGNOSIS — Z6830 Body mass index (BMI) 30.0-30.9, adult: Secondary | ICD-10-CM

## 2020-09-23 DIAGNOSIS — E669 Obesity, unspecified: Secondary | ICD-10-CM | POA: Diagnosis present

## 2020-09-23 DIAGNOSIS — D6859 Other primary thrombophilia: Secondary | ICD-10-CM | POA: Diagnosis present

## 2020-09-23 DIAGNOSIS — Z9049 Acquired absence of other specified parts of digestive tract: Secondary | ICD-10-CM

## 2020-09-23 DIAGNOSIS — D62 Acute posthemorrhagic anemia: Secondary | ICD-10-CM | POA: Diagnosis present

## 2020-09-23 DIAGNOSIS — R1114 Bilious vomiting: Secondary | ICD-10-CM

## 2020-09-23 DIAGNOSIS — I951 Orthostatic hypotension: Secondary | ICD-10-CM | POA: Diagnosis not present

## 2020-09-23 LAB — CBC WITH DIFFERENTIAL/PLATELET
Abs Immature Granulocytes: 0.08 10*3/uL — ABNORMAL HIGH (ref 0.00–0.07)
Basophils Absolute: 0.1 10*3/uL (ref 0.0–0.1)
Basophils Relative: 1 %
Eosinophils Absolute: 0.4 10*3/uL (ref 0.0–0.5)
Eosinophils Relative: 4 %
HCT: 39.2 % (ref 39.0–52.0)
Hemoglobin: 12 g/dL — ABNORMAL LOW (ref 13.0–17.0)
Immature Granulocytes: 1 %
Lymphocytes Relative: 8 %
Lymphs Abs: 0.9 10*3/uL (ref 0.7–4.0)
MCH: 28.3 pg (ref 26.0–34.0)
MCHC: 30.6 g/dL (ref 30.0–36.0)
MCV: 92.5 fL (ref 80.0–100.0)
Monocytes Absolute: 1.3 10*3/uL — ABNORMAL HIGH (ref 0.1–1.0)
Monocytes Relative: 11 %
Neutro Abs: 8.9 10*3/uL — ABNORMAL HIGH (ref 1.7–7.7)
Neutrophils Relative %: 75 %
Platelets: 385 10*3/uL (ref 150–400)
RBC: 4.24 MIL/uL (ref 4.22–5.81)
RDW: 17.3 % — ABNORMAL HIGH (ref 11.5–15.5)
WBC: 11.6 10*3/uL — ABNORMAL HIGH (ref 4.0–10.5)
nRBC: 0 % (ref 0.0–0.2)

## 2020-09-23 LAB — COMPREHENSIVE METABOLIC PANEL
ALT: 20 U/L (ref 0–44)
AST: 21 U/L (ref 15–41)
Albumin: 2.6 g/dL — ABNORMAL LOW (ref 3.5–5.0)
Alkaline Phosphatase: 236 U/L — ABNORMAL HIGH (ref 38–126)
Anion gap: 9 (ref 5–15)
BUN: 6 mg/dL — ABNORMAL LOW (ref 8–23)
CO2: 26 mmol/L (ref 22–32)
Calcium: 8.6 mg/dL — ABNORMAL LOW (ref 8.9–10.3)
Chloride: 101 mmol/L (ref 98–111)
Creatinine, Ser: 0.98 mg/dL (ref 0.61–1.24)
GFR, Estimated: >60 mL/min (ref >60)
Glucose, Bld: 138 mg/dL — ABNORMAL HIGH (ref 70–99)
Potassium: 3.6 mmol/L (ref 3.5–5.1)
Sodium: 136 mmol/L (ref 135–145)
Total Bilirubin: 1.2 mg/dL (ref 0.3–1.2)
Total Protein: 6.5 g/dL (ref 6.5–8.1)

## 2020-09-23 LAB — URINALYSIS, ROUTINE W REFLEX MICROSCOPIC
Bilirubin Urine: NEGATIVE
Glucose, UA: NEGATIVE mg/dL
Ketones, ur: 5 mg/dL — AB
Nitrite: NEGATIVE
Protein, ur: 30 mg/dL — AB
RBC / HPF: >50 RBC/hpf — ABNORMAL HIGH (ref 0–5)
Specific Gravity, Urine: 1.041 — ABNORMAL HIGH (ref 1.005–1.030)
pH: 5 (ref 5.0–8.0)

## 2020-09-23 LAB — RESP PANEL BY RT PCR (RSV, FLU A&B, COVID)
Influenza A by PCR: NEGATIVE
Influenza B by PCR: NEGATIVE
Respiratory Syncytial Virus by PCR: NEGATIVE
SARS Coronavirus 2 by RT PCR: NEGATIVE

## 2020-09-23 LAB — LIPASE, BLOOD: Lipase: 44 U/L (ref 11–51)

## 2020-09-23 MED ORDER — POTASSIUM CHLORIDE IN NACL 20-0.9 MEQ/L-% IV SOLN
INTRAVENOUS | Status: AC
  Filled 2020-09-23: qty 1000

## 2020-09-23 MED ORDER — ACETAMINOPHEN 650 MG RE SUPP: 650.0000 mg | Freq: Four times a day (QID) | RECTAL | Status: DC | PRN

## 2020-09-23 MED ORDER — ONDANSETRON HCL 4 MG PO TABS
4.0000 mg | ORAL_TABLET | Freq: Four times a day (QID) | ORAL | Status: DC | PRN
  Administered 2020-09-25: 4 mg via ORAL
  Filled 2020-09-23: qty 1

## 2020-09-23 MED ORDER — ACETAMINOPHEN 325 MG PO TABS
650.0000 mg | ORAL_TABLET | Freq: Four times a day (QID) | ORAL | Status: DC | PRN
  Administered 2020-09-25 – 2020-09-27 (×4): 650 mg via ORAL
  Filled 2020-09-23 (×5): qty 2

## 2020-09-23 MED ORDER — ONDANSETRON HCL 4 MG/2ML IJ SOLN
4.0000 mg | Freq: Once | INTRAMUSCULAR | Status: AC
  Administered 2020-09-23: 4 mg via INTRAVENOUS
  Filled 2020-09-23: qty 2

## 2020-09-23 MED ORDER — HYDROMORPHONE HCL 1 MG/ML IJ SOLN: 1.0000 mg | INTRAMUSCULAR | Status: DC | PRN

## 2020-09-23 MED ORDER — ONDANSETRON HCL 4 MG/2ML IJ SOLN: 4.0000 mg | Freq: Four times a day (QID) | INTRAMUSCULAR | Status: DC | PRN

## 2020-09-23 MED ORDER — RIVAROXABAN 20 MG PO TABS
20.0000 mg | ORAL_TABLET | Freq: Every day | ORAL | Status: DC
  Administered 2020-09-24 – 2020-10-01 (×8): 20 mg via ORAL
  Filled 2020-09-23 (×8): qty 1

## 2020-09-23 MED ORDER — HYDROMORPHONE HCL 1 MG/ML IJ SOLN
1.0000 mg | Freq: Once | INTRAMUSCULAR | Status: AC
  Administered 2020-09-23: 1 mg via INTRAVENOUS
  Filled 2020-09-23: qty 1

## 2020-09-23 MED ORDER — SODIUM CHLORIDE 0.9 % IV SOLN: INTRAVENOUS | Status: DC

## 2020-09-23 MED ORDER — IOHEXOL 300 MG/ML  SOLN
100.0000 mL | Freq: Once | INTRAMUSCULAR | Status: AC | PRN
  Administered 2020-09-23: 100 mL via INTRAVENOUS

## 2020-09-23 MED ORDER — POTASSIUM CHLORIDE IN NACL 20-0.9 MEQ/L-% IV SOLN: INTRAVENOUS | Status: DC

## 2020-09-23 NOTE — Telephone Encounter (Signed)
Spoke to Marisue Ivan she was going to contact patient

## 2020-09-23 NOTE — Telephone Encounter (Signed)
Hold Amlodipine but continue Benicar and continue to monitor

## 2020-09-23 NOTE — ED Notes (Signed)
Patient has restricted left limb. Appropriate bracelet placed on arm.

## 2020-09-23 NOTE — ED Notes (Signed)
Report given to charge nurse on 5W, receiving nurse was giving report on another patient.

## 2020-09-23 NOTE — ED Provider Notes (Signed)
[facility]-EMERGENCY DEPT Provider Note   CSN: [id number] Arrival date & time: 09/23/20  1729     History No chief complaint on file.   CHAROD SLAWINSKI is a 69 y.o. male.  HPI   Patient presents with weakness, nausea, vomiting.  Patient has multiple medical problems, and has recent notable history of CABG, and not long thereafter partial cholecystectomy.  His most recent surgery was last week.  He notes that he was hospitalized until 3 days ago.  Now, since being home he has had persistent anorexia, nausea, minimal oral intake of liquids or solids.  He had 1 episode of vomiting yesterday.  No focal pain in his chest, nor abdomen, no cutaneous changes around the surgical sites, no fever.  However, in spite of taking all of his medication he continues to feel weaker, with worsening nausea, anorexia. Patient arrives via EMS. Per EMS patient was hemodynamically unremarkable in route with a glucose of 138.  Past Medical History:  Diagnosis Date  . Atrial fibrillation (HCC)   . Coronary artery disease   . Dysrhythmia   . Hypertension   . LBBB (left bundle branch block)     Patient Active Problem List   Diagnosis Date Noted  . Nausea and vomiting 09/23/2020  . Benign hypertension   . Abnormal magnetic resonance cholangiopancreatography (MRCP)   . Common bile duct (CBD) obstruction   . Elevated LFTs   . Choledocholithiasis with acute cholecystitis   . AKI (acute kidney injury) (HCC) 09/15/2020  . Sepsis (HCC) 09/15/2020  . Abdominal pain 09/15/2020  . Abnormal CT of the abdomen   . Abnormal liver enzymes   . Acute cholecystitis 09/14/2020  . Malnutrition of moderate degree (HCC) 08/07/2020  . Acute blood loss anemia 08/07/2020  . CAD, multiple vessel 08/07/2020  . 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 Postural dizziness with presyncope 07/24/2020  . Hypokalemia 07/24/2020  . Diarrhea 07/24/2020  . Lightheadedness 07/24/2020  . Long term current use of  anticoagulant 01/14/2020  . Benign hypertension with CKD (chronic kidney disease) stage III 06/12/2019  . Pure hypercholesterolemia 06/12/2019  . Atrial fibrillation (HCC) 09/25/2016    Past Surgical History:  Procedure Laterality Date  . CARDIOVERSION N/A 09/28/2016   Procedure: CARDIOVERSION;  Surgeon: Yates Decamp, MD;  Location: [facility] ENDOSCOPY;  Service: Cardiovascular;  Laterality: N/A;  . CHOLECYSTECTOMY N/A 09/18/2020   Procedure: LAPAROSCOPIC PARTIAL CHOLECYSTECTOMY;  Surgeon: Ovidio Kin, MD;  Location: [facility] ORS;  Service: General;  Laterality: N/A;  . CLIPPING OF ATRIAL APPENDAGE Left 07/29/2020   Procedure: CLIPPING OF ATRIAL APPENDAGE;  Surgeon: Linden Dolin, MD;  Location: [facility] OR;  Service: Open Heart Surgery;  Laterality: Left;  . CORONARY ARTERY BYPASS GRAFT N/A 07/29/2020   Procedure: CORONARY ARTERY BYPASS GRAFTING (CABG) TIMES FOUR USING BILATERAL INTERNAL MAMMARIES AND LEFT RADIAL ARTERY;  Surgeon: Linden Dolin, MD;  Location: [facility] OR;  Service: Open Heart Surgery;  Laterality: N/A;  . ERCP N/A 09/17/2020   Procedure: ENDOSCOPIC RETROGRADE CHOLANGIOPANCREATOGRAPHY (ERCP);  Surgeon: Meryl Dare, MD;  Location: [facility] ENDOSCOPY;  Service: Endoscopy;  Laterality: N/A;  . LEFT HEART CATH AND CORONARY ANGIOGRAPHY N/A 07/25/2020   Procedure: LEFT HEART CATH AND CORONARY ANGIOGRAPHY;  Surgeon: Yates Decamp, MD;  Location: [facility] INVASIVE CV LAB;  Service: Cardiovascular;  Laterality: N/A;  . MAZE N/A 07/29/2020   Procedure: MAZE;  Surgeon: Linden Dolin, MD;  Location: [facility] OR;  Service: Open Heart Surgery;  Laterality: N/A;  . NO PAST SURGERIES    .  RADIAL ARTERY HARVEST Left 07/29/2020   Procedure: RADIAL ARTERY HARVEST;  Surgeon: Linden Dolin, MD;  Location: [facility] OR;  Service: Open Heart Surgery;  Laterality: Left;  . REMOVAL OF STONES  09/17/2020   Procedure: REMOVAL OF STONES;  Surgeon: Meryl Dare, MD;  Location: [facility] ENDOSCOPY;  Service: Endoscopy;;  . Dennison Mascot   09/17/2020   Procedure: SPHINCTEROTOMY;  Surgeon: Meryl Dare, MD;  Location: [facility] ENDOSCOPY;  Service: Endoscopy;;  . TEE WITHOUT CARDIOVERSION N/A 07/29/2020   Procedure: TRANSESOPHAGEAL ECHOCARDIOGRAM (TEE);  Surgeon: Linden Dolin, MD;  Location: [facility] OR;  Service: Open Heart Surgery;  Laterality: N/A;       Family History  Problem Relation Age of Onset  . Hypertension Other     Social History   Tobacco Use  . Smoking status: Never Smoker  . Smokeless tobacco: Never Used  Vaping Use  . Vaping Use: Never used  Substance Use Topics  . Alcohol use: No  . Drug use: No    Home Medications Prior to Admission medications   Medication Sig Start Date End Date Taking? Authorizing Provider  acetaminophen (TYLENOL) 500 MG tablet Take 1,000 mg by mouth every 6 (six) hours as needed for moderate pain.    Yes [provider]  metoprolol tartrate (LOPRESSOR) 25 MG tablet Take 0.5 tablets (12.5 mg total) by mouth daily. Patient taking differently: Take 12.5 mg by mouth in the morning.  08/26/20 11/24/20 Yes Tolia, Sunit, DO  amLODipine (NORVASC) 10 MG tablet TAKE 1 TABLET BY MOUTH  DAILY Patient taking differently: Take 10 mg by mouth daily.  09/22/20   Tolia, Sunit, DO  amoxicillin-clavulanate (AUGMENTIN) 875-125 MG tablet Take 1 tablet by mouth 2 (two) times daily for 4 days. 09/22/20 09/26/20  Tyrone Nine, MD  Ensure (ENSURE) Take 1 Can by mouth See admin instructions. Drink 1 can of VANILLA ENSURE by mouth once a day    [provider]  HYDROcodone-acetaminophen (NORCO/VICODIN) 5-325 MG tablet Take 1 tablet by mouth every 4 (four) hours as needed for moderate pain. 09/22/20   Barnetta Chapel, PA-C  loperamide (IMODIUM A-D) 2 MG tablet Take 2 mg by mouth 4 (four) times daily as needed for diarrhea or loose stools.    [provider]  olmesartan (BENICAR) 40 MG tablet Take 40 mg by mouth in the morning.     [provider]  rosuvastatin (CRESTOR) 40  MG tablet Take 1 tablet (40 mg total) by mouth daily. Patient taking differently: Take 40 mg by mouth at bedtime.  08/05/20   Doree Fudge M, PA-C  XARELTO 20 MG TABS tablet TAKE 1 TABLET BY MOUTH IN  THE EVENING AFTER DINNER 09/22/20   Tolia, Sunit, DO    Allergies    Patient has no known allergies.  Review of Systems   Review of Systems  Constitutional:       Per HPI, otherwise negative  HENT:       Per HPI, otherwise negative  Respiratory:       Per HPI, otherwise negative  Cardiovascular:       Per HPI, otherwise negative  Gastrointestinal: Positive for nausea and vomiting. Negative for abdominal pain.  Endocrine:       Negative aside from HPI  Genitourinary:       Neg aside from HPI   Musculoskeletal:       Per HPI, otherwise negative  Skin: Positive for color change.  Neurological: Positive for weakness. Negative for syncope.  Physical Exam Updated Vital Signs BP (!) 150/83   Pulse 85   Temp 97.8 F (36.6 C) (Oral)   Resp (!) 21   SpO2 95%   Physical Exam Vitals and nursing note reviewed.  Constitutional:      General: He is not in acute distress.    Appearance: He is well-developed.  HENT:     Head: Normocephalic and atraumatic.  Eyes:     Conjunctiva/sclera: Conjunctivae normal.  Cardiovascular:     Rate and Rhythm: Normal rate and regular rhythm.  Pulmonary:     Effort: Pulmonary effort is normal. No respiratory distress.     Breath sounds: No stridor.  Chest:    Abdominal:     General: There is no distension.     Tenderness: There is no abdominal tenderness. There is no guarding.    Skin:    General: Skin is warm and dry.  Neurological:     Mental Status: He is alert and oriented to person, place, and time.      ED Results / Procedures / Treatments   Labs (all labs ordered are listed, but only abnormal results are displayed) Labs Reviewed  COMPREHENSIVE METABOLIC PANEL - Abnormal; Notable for the following components:       Result Value   Glucose, Bld 138 (*)    BUN 6 (*)    Calcium 8.6 (*)    Albumin 2.6 (*)    Alkaline Phosphatase 236 (*)    All other components within normal limits  CBC WITH DIFFERENTIAL/PLATELET - Abnormal; Notable for the following components:   WBC 11.6 (*)    Hemoglobin 12.0 (*)    RDW 17.3 (*)    Neutro Abs 8.9 (*)    Monocytes Absolute 1.3 (*)    Abs Immature Granulocytes 0.08 (*)    All other components within normal limits  RESP PANEL BY RT PCR (RSV, FLU A&B, COVID)  LIPASE, BLOOD  URINALYSIS, ROUTINE W REFLEX MICROSCOPIC  CBC  COMPREHENSIVE METABOLIC PANEL    EKG A. fib, rate 84 intraventricular conduction delay, abnormal   Radiology CT ABDOMEN PELVIS W CONTRAST  Result Date: 09/23/2020 CLINICAL DATA:  Status post cholecystectomy with weakness. Anorexia. Nausea. Recent fall. EXAM: CT ABDOMEN AND PELVIS WITH CONTRAST TECHNIQUE: Multidetector CT imaging of the abdomen and pelvis was performed using the standard protocol following bolus administration of intravenous contrast. CONTRAST:  100mL OMNIPAQUE IOHEXOL 300 MG/ML  SOLN COMPARISON:  09/15/2020 MRCP. 09/17/2020 ERCP. Abdominopelvic CT 09/14/2020. FINDINGS: Lower chest: Mild right base atelectasis with calcified right sided granulomas. Left lower lobe collapse with increase in moderate left and small right pleural effusions. Mild cardiomegaly with prior median sternotomy and native coronary artery atherosclerosis. Hepatobiliary: Minimal pneumobilia. Resolved intrahepatic biliary duct dilatation. Probable focal fat adjacent the falciform ligament. Cholecystectomy. Ill-defined fluid and edema within the operative bed. Example at 4.6 x 2.8 cm on 31/2. More caudally, contiguous somewhat ill-defined fluid anterior to the pylorus measures 3.0 x 2.5 cm on 37/2. Surgical drain terminates in the operative bed. Pancreas: Normal, without mass or ductal dilatation. Spleen: Normal in size, without focal abnormality. Adrenals/Urinary Tract:  Normal adrenal glands. A stone at the left ureteropelvic junction measures 6 mm on 41/2, in the left renal pelvis on the prior. Development of right-sided mild hydroureteronephrosis with decreased excretion of contrast from the right kidney. A mid right ureteric 3 mm stone is most apparent on coronal image 72. The right-side of the bladder enters an inguinal hernia, similar. Stomach/Bowel: Normal stomach,  without wall thickening. Normal colon, appendix, and terminal ileum. Normal abdominal small bowel. Vascular/Lymphatic: Aortic atherosclerosis. No abdominopelvic adenopathy. Reproductive: Normal prostate. Other: Right inguinal hernia containing fat and a portion of the bladder, similar. No significant free fluid. No free intraperitoneal air. Minimal air within the anterior subcutaneous abdominal wall is likely postoperative. Musculoskeletal: Subtle left femoral head avascular necrosis. IMPRESSION: 1. Status post cholecystectomy with ill-defined fluid and edema in the operative bed. Considerations include phlegmon/developing abscess, biloma, and postoperative hematoma. 2. Development of right-sided urinary tract obstruction secondary to a punctate mid right ureteric stone. 3. Left-sided stone at the ureteropelvic junction, without significant hydronephrosis. 4. Increase in left larger than right pleural effusions with progressive left base atelectasis. 5. Fat and right-side of the bladder within a inguinal hernia, as before. 6.  Aortic Atherosclerosis (ICD10-I70.0). Electronically Signed   By: Jeronimo Greaves M.D.   On: 09/23/2020 19:22   DG Chest Port 1 View  Result Date: 09/23/2020 CLINICAL DATA:  Pleural effusions, post gallbladder surgery 09/22/2020. Increasing weakness and mechanical fall EXAM: PORTABLE CHEST 1 VIEW COMPARISON:  Radiograph 09/14/2020 FINDINGS: Enlarging left pleural effusion with adjacent opacity likely reflecting passive atelectasis though underlying infection or edema is not fully excluded.  There is some gradient opacity in the right lung as well which could reflect atelectasis, edema or early airspace disease. Trace peripheral right pleural thickening may reflect trace effusion as well. No pneumothorax. Postsurgical changes related to prior CABG including intact and aligned sternotomy wires and multiple surgical clips projecting over the mediastinum. Atrial occluder device is noted. The aorta is calcified. The remaining cardiomediastinal contours are unremarkable. Telemetry leads overlie the chest. No acute osseous or soft tissue abnormality. IMPRESSION: 1. Enlarging left pleural effusion with adjacent opacity likely reflecting passive atelectasis though underlying infection or edema is not fully excluded. 2. Vascular congestion and gradient opacity in the right lung which is favored to be a combination of atelectasis and edema. Trace right effusion as well. 3.  Aortic Atherosclerosis (ICD10-I70.0). Electronically Signed   By: Kreg Shropshire M.D.   On: 09/23/2020 20:13    Procedures Procedures (including critical care time)  Medications Ordered in ED Medications  0.9 % NaCl with KCl 20 mEq/ L  infusion (has no administration in time range)  acetaminophen (TYLENOL) tablet 650 mg (has no administration in time range)    Or  acetaminophen (TYLENOL) suppository 650 mg (has no administration in time range)  ondansetron (ZOFRAN) tablet 4 mg (has no administration in time range)    Or  ondansetron (ZOFRAN) injection 4 mg (has no administration in time range)  HYDROmorphone (DILAUDID) injection 1 mg (has no administration in time range)  iohexol (OMNIPAQUE) 300 MG/ML solution 100 mL (100 mLs Intravenous Contrast Given 09/23/20 1848)  HYDROmorphone (DILAUDID) injection 1 mg (1 mg Intravenous Given 09/23/20 1959)  ondansetron (ZOFRAN) injection 4 mg (4 mg Intravenous Given 09/23/20 2032)    ED Course  I have reviewed the triage vital signs and the nursing notes.  Pertinent labs & imaging  results that were available during my care of the patient were reviewed by me and considered in my medical decision making (see chart for details).     Adult male with multiple medical issues including multiple recent surgeries presents with weakness, nausea, vomiting.  Patient is initially hemodynamically unremarkable, initial cardiac rhythm A. fib, 90 abnormal Pulse oximetry 97% room air unremarkable  Update:, Patient now vomiting.  Update:, Patient's vomiting is stopped, he continues to complain of pain, however.  I reviewed the CT scan myself, discussed with the patient, x-ray pending.  Update:, X-ray consistent with worsening pleural effusion. Though the patient does not need oxygen, given his worsening overall status, p.o. intolerance, concern for pleural effusions, kidney stones, and ongoing nausea, vomiting, the patient will require admission for further monitoring, management.  Final Clinical Impression(s) / ED Diagnoses Final diagnoses:  Bilious vomiting with nausea  Nephrolithiasis     Gerhard Munch, MD 09/23/20 2148

## 2020-09-23 NOTE — Telephone Encounter (Signed)
Marisue Ivan, RN from [facility] :   Patient had gallbladder removal last Thursday. Patients discharge papers has him taking Amlodipine and Benicar, but after she checked his BP this morning, it was 124/68 and she is concerned to have him take his medication this morning. She wants to know if he should continued his BP medication, because she is worried that it may drop to low.   MA's call Marisue Ivan back on her call:   [phone]

## 2020-09-23 NOTE — ED Triage Notes (Signed)
Pt arrived via EMS, from home, s/p gallbladder surgery, dx 11/15. States he has been increasingly weak at home. States he had a mechanical fall at home, family was there to help. Diarrhea and nausea at home. Denies any vomiting today, has been vomitting the last couple days.

## 2020-09-23 NOTE — H&P (Signed)
History and Physical    [name] Rice ZSW:[id number] DOB: 10/10/1951 DOA: 09/23/2020  PCP: [name], PA-C   Patient coming from: Home.  I have personally briefly reviewed patient's old medical records in [facility]  Chief Complaint: Weakness.  HPI: [name] Rice is a 69 y.o. male with medical history significant of chronic atrial fibrillation, CAD, s/p CABG almost 2 months ago, LBBB, chronic systolic heart failure of 45 to 50% per recent echocardiogram who was recently admitted and discharged from 09/14/2020 to 09/22/2020 for acute cholecystitis requiring ERCP with successful extraction of a stone, followed by partial laparoscopic cholecystectomy with JP drainage placement who returns to the hospital due to generalized weakness associated with decreased oral intake, abdominal pain, nausea, 2 episodes of emesis and a history of a mechanical fall at home without any significant injury. He states his diarrhea has gotten better in the past few days. He denies fever, but complains of chills and fatigue. He denies rhinorrhea, sore throat, wheezing or hemoptysis. No chest pain, palpitations, diaphoresis, PND or orthopnea. He gets occasional lower extremity edema, but states that this has improved significantly. He denies constipation, melena or hematochezia. He has had mild dysuria along with right flank pain, but no frequency, oliguria or hematuria. No polyuria, polydipsia, polyphagia or blurred vision.  ED Course: Initial vital signs were temperature 97.8 F, pulse 86, respirations 16, blood pressure 143/99 mmHg O2 sat 98% on room air.  Labs: Coronavirus and influenza PCR was negative.  Urine analysis showed large hemoglobinuria with more than 50 RBC microscopic examination.  Ketonuria 5 and proteinuria 30 mg/dL.  There was trace leukocyte esterase with 21-50 WBCs per hpf.  There were a few bacteria, mucus, but engaged and hyaline casts.  Imaging: 1 view chest radiograph shows  enlarging left pleural effusion likely reflecting passive atelectasis though underlying infection or edema could not be fully excluded.  There is vascular congestion and grading opacity in the right lung which is favored to be a combination of atelectasis and edema.  There is trace right pleural effusion as well.  CT abdomen/pelvis with contrast shows status post cholecystectomy with ill-defined fluid and edema in the operative bed.  Considerations include phlegmon, developing abscess, biloma and postoperative hematoma.  Development of right-sided urinary tract obstruction secondary to a punctate mid right ureteric stone treated close to left-sided stone at the ureteropelvic junction without significant hydronephrosis.  There is atelectasis.  There is fat and right side of the bladder within an inguinal hernia.  Please see images and full radiology report.  Review of Systems: As per HPI otherwise all other systems reviewed and are negative.  Past Medical History:  Diagnosis Date  . Atrial fibrillation (HCC)   . Coronary artery disease   . Dysrhythmia   . Hypertension   . LBBB (left bundle branch block)    Past Surgical History:  Procedure Laterality Date  . CARDIOVERSION N/A 09/28/2016   Procedure: CARDIOVERSION;  Surgeon: Yates Decamp, MD;  Location: [facility] ENDOSCOPY;  Service: Cardiovascular;  Laterality: N/A;  . CHOLECYSTECTOMY N/A 09/18/2020   Procedure: LAPAROSCOPIC PARTIAL CHOLECYSTECTOMY;  Surgeon: Ovidio Kin, MD;  Location: [facility] ORS;  Service: General;  Laterality: N/A;  . CLIPPING OF ATRIAL APPENDAGE Left 07/29/2020   Procedure: CLIPPING OF ATRIAL APPENDAGE;  Surgeon: Linden Dolin, MD;  Location: [facility] OR;  Service: Open Heart Surgery;  Laterality: Left;  . CORONARY ARTERY BYPASS GRAFT N/A 07/29/2020   Procedure: CORONARY ARTERY BYPASS GRAFTING (CABG) TIMES FOUR USING BILATERAL  INTERNAL MAMMARIES AND LEFT RADIAL ARTERY;  Surgeon: Linden Dolin, MD;  Location: [facility] OR;  Service: Open Heart  Surgery;  Laterality: N/A;  . ERCP N/A 09/17/2020   Procedure: ENDOSCOPIC RETROGRADE CHOLANGIOPANCREATOGRAPHY (ERCP);  Surgeon: Meryl Dare, MD;  Location: [facility] ENDOSCOPY;  Service: Endoscopy;  Laterality: N/A;  . LEFT HEART CATH AND CORONARY ANGIOGRAPHY N/A 07/25/2020   Procedure: LEFT HEART CATH AND CORONARY ANGIOGRAPHY;  Surgeon: Yates Decamp, MD;  Location: [facility] INVASIVE CV LAB;  Service: Cardiovascular;  Laterality: N/A;  . MAZE N/A 07/29/2020   Procedure: MAZE;  Surgeon: Linden Dolin, MD;  Location: [facility] OR;  Service: Open Heart Surgery;  Laterality: N/A;  . NO PAST SURGERIES    . RADIAL ARTERY HARVEST Left 07/29/2020   Procedure: RADIAL ARTERY HARVEST;  Surgeon: Linden Dolin, MD;  Location: [facility] OR;  Service: Open Heart Surgery;  Laterality: Left;  . REMOVAL OF STONES  09/17/2020   Procedure: REMOVAL OF STONES;  Surgeon: Meryl Dare, MD;  Location: [facility] ENDOSCOPY;  Service: Endoscopy;;  . Dennison Mascot  09/17/2020   Procedure: SPHINCTEROTOMY;  Surgeon: Meryl Dare, MD;  Location: [facility] ENDOSCOPY;  Service: Endoscopy;;  . TEE WITHOUT CARDIOVERSION N/A 07/29/2020   Procedure: TRANSESOPHAGEAL ECHOCARDIOGRAM (TEE);  Surgeon: Linden Dolin, MD;  Location: [facility] OR;  Service: Open Heart Surgery;  Laterality: N/A;   Social History  reports that he has never smoked. He has never used smokeless tobacco. He reports that he does not drink alcohol and does not use drugs.  No Known Allergies  Family History  Problem Relation Age of Onset  . Hypertension Other    Prior to Admission medications   Medication Sig Start Date End Date Taking? Authorizing Provider  acetaminophen (TYLENOL) 500 MG tablet Take 1,000 mg by mouth every 6 (six) hours as needed for moderate pain.    Yes [provider]  metoprolol tartrate (LOPRESSOR) 25 MG tablet Take 0.5 tablets (12.5 mg total) by mouth daily. Patient taking differently: Take 12.5 mg by mouth in the morning.  08/26/20 11/24/20 Yes Tolia,  Sunit, DO  amLODipine (NORVASC) 10 MG tablet TAKE 1 TABLET BY MOUTH  DAILY Patient taking differently: Take 10 mg by mouth daily.  09/22/20   Tolia, Sunit, DO  amoxicillin-clavulanate (AUGMENTIN) 875-125 MG tablet Take 1 tablet by mouth 2 (two) times daily for 4 days. 09/22/20 09/26/20  Tyrone Nine, MD  Ensure (ENSURE) Take 1 Can by mouth See admin instructions. Drink 1 can of VANILLA ENSURE by mouth once a day    [provider]  HYDROcodone-acetaminophen (NORCO/VICODIN) 5-325 MG tablet Take 1 tablet by mouth every 4 (four) hours as needed for moderate pain. 09/22/20   Barnetta Chapel, PA-C  loperamide (IMODIUM A-D) 2 MG tablet Take 2 mg by mouth 4 (four) times daily as needed for diarrhea or loose stools.    [provider]  olmesartan (BENICAR) 40 MG tablet Take 40 mg by mouth in the morning.     [provider]  rosuvastatin (CRESTOR) 40 MG tablet Take 1 tablet (40 mg total) by mouth daily. Patient taking differently: Take 40 mg by mouth at bedtime.  08/05/20   Doree Fudge M, PA-C  XARELTO 20 MG TABS tablet TAKE 1 TABLET BY MOUTH IN  THE [facility] AFTER DINNER 09/22/20   Tessa Lerner, DO   Physical Exam: Vitals:   09/23/20 1745 09/23/20 1830 09/23/20 2017 09/23/20 2100  BP: (!) 143/99 (!) 109/95 (!) 150/89 (!) 150/83  Pulse:  85 91 85  Resp:  (!) 23 20 (!) 21  Temp: 97.8 F (36.6 C)     TempSrc: Oral     SpO2:  99% 97% 95%   Constitutional: Looks chronically ill, but nontoxic. Eyes: PERRL, lids and conjunctivae mildly ejected. ENMT: Mucous membranes are dry. Posterior pharynx clear of any exudate or lesions. Neck: normal, supple, no masses, no thyromegaly Respiratory: Decreased breath sounds in bases, otherwise clear to auscultation bilaterally, no wheezing, no crackles. Normal respiratory effort. No accessory muscle use.  Cardiovascular: Irregularly irregular, no murmurs / rubs / gallops. No extremity edema. 2+ pedal pulses. No carotid bruits.    Abdomen: No distention, obese.  BS positive.  Soft, minimal RUQ tenderness, no masses palpated. No hepatosplenomegaly. Bowel sounds positive.  Musculoskeletal: Generalized weakness.  No clubbing / cyanosis. Good ROM, no contractures. Normal muscle tone.  Skin: no complaints of or clinically significant rashes, lesions, ulcers on very limited dermatological examination. Neurologic: CN 2-12 grossly intact. Sensation intact, DTR normal.  Strength 4 /5 in all 4.  Psychiatric: Normal judgment and insight. Alert and oriented x 3. Normal mood.   Labs on Admission: I have personally reviewed following labs and imaging studies  CBC: Recent Labs  Lab 09/19/20 0831 09/20/20 0559 09/21/20 0532 09/22/20 0434 09/23/20 1800  WBC 17.1* 13.7* 11.9* 10.1 11.6*  NEUTROABS  --   --   --   --  8.9*  HGB 11.4* 11.2* 10.3* 10.7* 12.0*  HCT 39.8 38.0* 34.8* 34.9* 39.2  MCV 98.0 95.7 96.1 92.1 92.5  PLT 361 324 295 309 385    Basic Metabolic Panel: Recent Labs  Lab 09/17/20 0441 09/17/20 1030 09/18/20 0439 09/18/20 0439 09/19/20 0831 09/20/20 0559 09/21/20 0532 09/22/20 0434 09/23/20 1800  NA 135  --  138   < > 137 136 135 137 136  K 3.1*   < > 4.7   < > 4.4 3.9 3.2* 3.1* 3.6  CL 104  --  108   < > 106 106 104 105 101  CO2 24  --  23   < > 20* 21* 23 25 26   GLUCOSE 104*  --  150*   < > 101* 99 99 99 138*  BUN 8  --  9   < > 10 9 9  7* 6*  CREATININE 1.01  --  1.44*   < > 1.23 1.04 0.99 0.81 0.98  CALCIUM 7.9*  --  8.0*   < > 8.2* 8.1* 7.9* 8.1* 8.6*  MG 1.9  --  1.7  --   --   --   --   --   --    < > = values in this interval not displayed.    GFR: Estimated Creatinine Clearance: 96.7 mL/min (by C-G formula based on SCr of 0.98 mg/dL).  Liver Function Tests: Recent Labs  Lab 09/19/20 0831 09/20/20 0559 09/21/20 0532 09/22/20 0434 09/23/20 1800  AST 34 21 16 14* 21  ALT 43 32 24 21 20   ALKPHOS 451* 338* 259* 223* 236*  BILITOT 1.3* 1.1 0.9 1.0 1.2  PROT 6.5 6.0* 5.5* 5.4* 6.5   ALBUMIN 2.6* 2.3* 2.1* 2.1* 2.6*   Radiological Exams on Admission: CT ABDOMEN PELVIS W CONTRAST  Result Date: 09/23/2020 CLINICAL DATA:  Status post cholecystectomy with weakness. Anorexia. Nausea. Recent fall. EXAM: CT ABDOMEN AND PELVIS WITH CONTRAST TECHNIQUE: Multidetector CT imaging of the abdomen and pelvis was performed using the standard protocol following bolus administration of intravenous  contrast. CONTRAST:  OMNIPAQUE IOHEXOL 300 MG/ML  SOLN COMPARISON:  09/15/2020 MRCP. 09/17/2020 ERCP. Abdominopelvic CT 09/14/2020. FINDINGS: Lower chest: Mild right base atelectasis with calcified right sided granulomas. Left lower lobe collapse with increase in moderate left and small right pleural effusions. Mild cardiomegaly with prior median sternotomy and native coronary artery atherosclerosis. Hepatobiliary: Minimal pneumobilia. Resolved intrahepatic biliary duct dilatation. Probable focal fat adjacent the falciform ligament. Cholecystectomy. Ill-defined fluid and edema within the operative bed. Example at 4.6 x 2.8 cm on 31/2. More caudally, contiguous somewhat ill-defined fluid anterior to the pylorus measures 3.0 x 2.5 cm on 37/2. Surgical drain terminates in the operative bed. Pancreas: Normal, without mass or ductal dilatation. Spleen: Normal in size, without focal abnormality. Adrenals/Urinary Tract: Normal adrenal glands. A stone at the left ureteropelvic junction measures 6 mm on 41/2, in the left renal pelvis on the prior. Development of right-sided mild hydroureteronephrosis with decreased excretion of contrast from the right kidney. A mid right ureteric 3 mm stone is most apparent on coronal image 72. The right-side of the bladder enters an inguinal hernia, similar. Stomach/Bowel: Normal stomach, without wall thickening. Normal colon, appendix, and terminal ileum. Normal abdominal small bowel. Vascular/Lymphatic: Aortic atherosclerosis. No abdominopelvic adenopathy. Reproductive: Normal  prostate. Other: Right inguinal hernia containing fat and a portion of the bladder, similar. No significant free fluid. No free intraperitoneal air. Minimal air within the anterior subcutaneous abdominal wall is likely postoperative. Musculoskeletal: Subtle left femoral head avascular necrosis. IMPRESSION: 1. Status post cholecystectomy with ill-defined fluid and edema in the operative bed. Considerations include phlegmon/developing abscess, biloma, and postoperative hematoma. 2. Development of right-sided urinary tract obstruction secondary to a punctate mid right ureteric stone. 3. Left-sided stone at the ureteropelvic junction, without significant hydronephrosis. 4. Increase in left larger than right pleural effusions with progressive left base atelectasis. 5. Fat and right-side of the bladder within a inguinal hernia, as before. 6.  Aortic Atherosclerosis (ICD10-I70.0). Electronically Signed   By: Jeronimo Greaves M.D.   On: 09/23/2020 19:22   DG Chest Port 1 View  Result Date: 09/23/2020 CLINICAL DATA:  Pleural effusions, post gallbladder surgery 09/22/2020. Increasing weakness and mechanical fall EXAM: PORTABLE CHEST 1 VIEW COMPARISON:  Radiograph 09/14/2020 FINDINGS: Enlarging left pleural effusion with adjacent opacity likely reflecting passive atelectasis though underlying infection or edema is not fully excluded. There is some gradient opacity in the right lung as well which could reflect atelectasis, edema or early airspace disease. Trace peripheral right pleural thickening may reflect trace effusion as well. No pneumothorax. Postsurgical changes related to prior CABG including intact and aligned sternotomy wires and multiple surgical clips projecting over the mediastinum. Atrial occluder device is noted. The aorta is calcified. The remaining cardiomediastinal contours are unremarkable. Telemetry leads overlie the chest. No acute osseous or soft tissue abnormality. IMPRESSION: 1. Enlarging left pleural  effusion with adjacent opacity likely reflecting passive atelectasis though underlying infection or edema is not fully excluded. 2. Vascular congestion and gradient opacity in the right lung which is favored to be a combination of atelectasis and edema. Trace right effusion as well. 3.  Aortic Atherosclerosis (ICD10-I70.0). Electronically Signed   By: Kreg Shropshire M.D.   On: 09/23/2020 20:13    EKG: Independently reviewed.  Assessment/Plan Principal Problem:   Nausea and vomiting Observation/telemetry. N.p.o. for now. Continue IV hydration. Protonix 40 mg IVP q 24 h. Antiemetics as needed. Analgesics as needed for abdominal pain.  Active Problems:   Right sided hydronephrosis with renal and ureteral  calculus obstruction Continue IV hydration. Tamsulosin 0.4 mg p.o. twice daily. Consult urology in the morning..    Budding yeast in U/A detected Will treat given symptoms and possible need for urological procedure. Fluconazole 150 mg p.o. daily.    Atrial fibrillation, chronic (HCC) CHA?DS?-VASc Score of at least 4. Continue metoprolol 12.5 mg p.o. daily. Continue Xarelto for anticoagulation.    Pure hypercholesterolemia Hold rosuvastatin while on fluconazole.    Malnutrition of moderate degree (HCC) Protein supplementation.    Acute blood loss anemia In the setting of 2 recent surgeries. Hemoglobin higher than baseline. Monitor H&H and transfuse as needed.    CAD, multiple vessel Continue beta-blocker and Xarelto. Holding a statin while using fluconazole.    Benign hypertension He has been holding amlodipine and olmesartan. Continue metoprolol 12.5 mg p.o. daily. Monitor blood pressure and heart rate.     DVT prophylaxis: On Xarelto. Code Status:   Full code. Family Communication: Disposition Plan:   Patient is from:  Home.  Anticipated DC to:  Home.  Anticipated DC date:  09/25/2020.  Anticipated DC barriers: Clinical status.  Consults called: Admission  status:  Observation/MedSurg.   Severity of Illness: Medium to high due to generalized weakness with volume depletion and decreased oral intake in the setting of 2 recent surgical interventions.  Bobette Mo MD [facility]  How to contact the [facility] Attending or Consulting provider 7A - 7P or covering provider during after hours 7P -7A, for this patient?   1. Check the care team in [facility] and look for a) attending/consulting [facility] provider listed and b) the [facility] team listed 2. Log into [email or web address] and use [place]'s universal password to access. If you do not have the password, please contact the hospital operator. 3. Locate the [facility] provider you are looking for under [facility] and page to a number that you can be directly reached. 4. If you still have difficulty reaching the provider, please page the [facility] (Director on Call) for the Hospitalists listed on amion for assistance.  09/23/2020, 9:49 PM   This document was prepared using Dragon voice recognition software and may contain some unintended transcription errors.

## 2020-09-23 NOTE — Telephone Encounter (Signed)
Wynelle Bourgeois, [place], will try again later.

## 2020-09-24 ENCOUNTER — Other Ambulatory Visit: Payer: Self-pay

## 2020-09-24 DIAGNOSIS — I130 Hypertensive heart and chronic kidney disease with heart failure and stage 1 through stage 4 chronic kidney disease, or unspecified chronic kidney disease: Secondary | ICD-10-CM | POA: Diagnosis present

## 2020-09-24 DIAGNOSIS — I4811 Longstanding persistent atrial fibrillation: Secondary | ICD-10-CM | POA: Diagnosis not present

## 2020-09-24 DIAGNOSIS — B379 Candidiasis, unspecified: Secondary | ICD-10-CM | POA: Diagnosis not present

## 2020-09-24 DIAGNOSIS — N200 Calculus of kidney: Secondary | ICD-10-CM | POA: Diagnosis present

## 2020-09-24 DIAGNOSIS — I4819 Other persistent atrial fibrillation: Secondary | ICD-10-CM

## 2020-09-24 DIAGNOSIS — E785 Hyperlipidemia, unspecified: Secondary | ICD-10-CM | POA: Diagnosis present

## 2020-09-24 DIAGNOSIS — Z6830 Body mass index (BMI) 30.0-30.9, adult: Secondary | ICD-10-CM | POA: Diagnosis not present

## 2020-09-24 DIAGNOSIS — R531 Weakness: Secondary | ICD-10-CM | POA: Diagnosis not present

## 2020-09-24 DIAGNOSIS — Z951 Presence of aortocoronary bypass graft: Secondary | ICD-10-CM | POA: Diagnosis not present

## 2020-09-24 DIAGNOSIS — E7800 Pure hypercholesterolemia, unspecified: Secondary | ICD-10-CM

## 2020-09-24 DIAGNOSIS — N183 Chronic kidney disease, stage 3 unspecified: Secondary | ICD-10-CM | POA: Diagnosis present

## 2020-09-24 DIAGNOSIS — N136 Pyonephrosis: Secondary | ICD-10-CM | POA: Diagnosis present

## 2020-09-24 DIAGNOSIS — D6859 Other primary thrombophilia: Secondary | ICD-10-CM | POA: Diagnosis present

## 2020-09-24 DIAGNOSIS — E669 Obesity, unspecified: Secondary | ICD-10-CM | POA: Diagnosis present

## 2020-09-24 DIAGNOSIS — Z8249 Family history of ischemic heart disease and other diseases of the circulatory system: Secondary | ICD-10-CM | POA: Diagnosis not present

## 2020-09-24 DIAGNOSIS — I10 Essential (primary) hypertension: Secondary | ICD-10-CM

## 2020-09-24 DIAGNOSIS — R112 Nausea with vomiting, unspecified: Secondary | ICD-10-CM | POA: Diagnosis not present

## 2020-09-24 DIAGNOSIS — I447 Left bundle-branch block, unspecified: Secondary | ICD-10-CM | POA: Diagnosis present

## 2020-09-24 DIAGNOSIS — E440 Moderate protein-calorie malnutrition: Secondary | ICD-10-CM

## 2020-09-24 DIAGNOSIS — E871 Hypo-osmolality and hyponatremia: Secondary | ICD-10-CM | POA: Diagnosis not present

## 2020-09-24 DIAGNOSIS — I5043 Acute on chronic combined systolic (congestive) and diastolic (congestive) heart failure: Secondary | ICD-10-CM | POA: Diagnosis present

## 2020-09-24 DIAGNOSIS — Z20822 Contact with and (suspected) exposure to covid-19: Secondary | ICD-10-CM | POA: Diagnosis present

## 2020-09-24 DIAGNOSIS — I2510 Atherosclerotic heart disease of native coronary artery without angina pectoris: Secondary | ICD-10-CM

## 2020-09-24 DIAGNOSIS — D649 Anemia, unspecified: Secondary | ICD-10-CM

## 2020-09-24 DIAGNOSIS — D62 Acute posthemorrhagic anemia: Secondary | ICD-10-CM | POA: Diagnosis present

## 2020-09-24 DIAGNOSIS — R21 Rash and other nonspecific skin eruption: Secondary | ICD-10-CM | POA: Diagnosis not present

## 2020-09-24 DIAGNOSIS — I951 Orthostatic hypotension: Secondary | ICD-10-CM | POA: Diagnosis not present

## 2020-09-24 DIAGNOSIS — Z9049 Acquired absence of other specified parts of digestive tract: Secondary | ICD-10-CM | POA: Diagnosis not present

## 2020-09-24 LAB — CBC
HCT: 36.2 % — ABNORMAL LOW (ref 39.0–52.0)
Hemoglobin: 11.3 g/dL — ABNORMAL LOW (ref 13.0–17.0)
MCH: 28.6 pg (ref 26.0–34.0)
MCHC: 31.2 g/dL (ref 30.0–36.0)
MCV: 91.6 fL (ref 80.0–100.0)
Platelets: 325 10*3/uL (ref 150–400)
RBC: 3.95 MIL/uL — ABNORMAL LOW (ref 4.22–5.81)
RDW: 17.4 % — ABNORMAL HIGH (ref 11.5–15.5)
WBC: 10 10*3/uL (ref 4.0–10.5)
nRBC: 0 % (ref 0.0–0.2)

## 2020-09-24 LAB — COMPREHENSIVE METABOLIC PANEL
ALT: 18 U/L (ref 0–44)
AST: 18 U/L (ref 15–41)
Albumin: 2.1 g/dL — ABNORMAL LOW (ref 3.5–5.0)
Alkaline Phosphatase: 184 U/L — ABNORMAL HIGH (ref 38–126)
Anion gap: 7 (ref 5–15)
BUN: 6 mg/dL — ABNORMAL LOW (ref 8–23)
CO2: 25 mmol/L (ref 22–32)
Calcium: 8 mg/dL — ABNORMAL LOW (ref 8.9–10.3)
Chloride: 103 mmol/L (ref 98–111)
Creatinine, Ser: 0.87 mg/dL (ref 0.61–1.24)
GFR, Estimated: >60 mL/min (ref >60)
Glucose, Bld: 88 mg/dL (ref 70–99)
Potassium: 3.7 mmol/L (ref 3.5–5.1)
Sodium: 135 mmol/L (ref 135–145)
Total Bilirubin: 1 mg/dL (ref 0.3–1.2)
Total Protein: 5.6 g/dL — ABNORMAL LOW (ref 6.5–8.1)

## 2020-09-24 MED ORDER — FLUCONAZOLE 150 MG PO TABS
150.0000 mg | ORAL_TABLET | Freq: Every day | ORAL | Status: DC
  Administered 2020-09-24 – 2020-09-25 (×3): 150 mg via ORAL
  Filled 2020-09-24 (×3): qty 1

## 2020-09-24 MED ORDER — ADULT MULTIVITAMIN W/MINERALS CH
1.0000 | ORAL_TABLET | Freq: Every day | ORAL | Status: DC
  Administered 2020-09-24 – 2020-10-01 (×8): 1 via ORAL
  Filled 2020-09-24 (×8): qty 1

## 2020-09-24 MED ORDER — ENSURE ENLIVE PO LIQD
237.0000 mL | Freq: Two times a day (BID) | ORAL | Status: DC
  Administered 2020-09-24 – 2020-10-01 (×11): 237 mL via ORAL

## 2020-09-24 MED ORDER — PANTOPRAZOLE SODIUM 40 MG IV SOLR
40.0000 mg | INTRAVENOUS | Status: DC
  Administered 2020-09-24 – 2020-09-25 (×2): 40 mg via INTRAVENOUS
  Filled 2020-09-24 (×2): qty 40

## 2020-09-24 MED ORDER — TAMSULOSIN HCL 0.4 MG PO CAPS
0.4000 mg | ORAL_CAPSULE | Freq: Every day | ORAL | Status: DC
  Administered 2020-09-24 – 2020-10-01 (×8): 0.4 mg via ORAL
  Filled 2020-09-24 (×8): qty 1

## 2020-09-24 MED ORDER — HYDROCODONE-ACETAMINOPHEN 5-325 MG PO TABS: 1.0000 | ORAL_TABLET | ORAL | Status: DC | PRN

## 2020-09-24 MED ORDER — SACCHAROMYCES BOULARDII 250 MG PO CAPS
250.0000 mg | ORAL_CAPSULE | Freq: Two times a day (BID) | ORAL | Status: DC
  Administered 2020-09-24 – 2020-09-26 (×5): 250 mg via ORAL
  Filled 2020-09-24 (×5): qty 1

## 2020-09-24 MED ORDER — METOPROLOL TARTRATE 25 MG PO TABS
12.5000 mg | ORAL_TABLET | Freq: Every morning | ORAL | Status: DC
  Administered 2020-09-24 – 2020-09-25 (×2): 12.5 mg via ORAL
  Filled 2020-09-24 (×3): qty 1

## 2020-09-24 MED ORDER — PIPERACILLIN-TAZOBACTAM 3.375 G IVPB
3.3750 g | Freq: Three times a day (TID) | INTRAVENOUS | Status: DC
  Administered 2020-09-24 – 2020-09-26 (×6): 3.375 g via INTRAVENOUS
  Filled 2020-09-24 (×6): qty 50

## 2020-09-24 MED ORDER — POTASSIUM CHLORIDE IN NACL 20-0.9 MEQ/L-% IV SOLN
INTRAVENOUS | Status: DC
  Filled 2020-09-24 (×2): qty 1000

## 2020-09-24 MED ORDER — PIPERACILLIN-TAZOBACTAM 3.375 G IVPB 30 MIN
3.3750 g | Freq: Once | INTRAVENOUS | Status: AC
  Administered 2020-09-24: 3.375 g via INTRAVENOUS
  Filled 2020-09-24: qty 50

## 2020-09-24 NOTE — Evaluation (Signed)
Physical Therapy Evaluation Patient Details Name: [name] Rice MRN: [id number] DOB: [birth year] Today's Date: 09/24/2020   History of Present Illness  69 yo male admitted with N/V, weakness, fall at home. 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 Recent d/c 09/22/2020  Clinical Impression  On eval, pt was Min guard assist for mobility. He walked ~125 feet with a RW. Pt tolerated activity well. Mildly unsteady intermittently. Discussed d/c plan-pt plans to return home. He would like to be able to regain as much strength as possible during this hospital stay prior to returning home. He has a goal of being able to walk without his walker. Will plan to follow and progress activity as tolerated.     Follow Up Recommendations Home health PT    Equipment Recommendations  None recommended by PT    Recommendations for Other Services       Precautions / Restrictions Precautions Precautions: Fall Precaution Comments: R JP drain Restrictions Weight Bearing Restrictions: No      Mobility  Bed Mobility Overal bed mobility: Modified Independent                  Transfers Overall transfer level: Modified independent Equipment used: Rolling walker (2 wheeled) Transfers: Sit to/from Stand Sit to Stand: Modified independent (Device/Increase time)            Ambulation/Gait Ambulation/Gait assistance: Min guard Gait Distance (Feet): 125 Feet Assistive device: Rolling walker (2 wheeled) Gait Pattern/deviations: Step-through pattern     General Gait Details: Used RW for safety. Mildly unsteady. NO knee buckling observed. Pt tolerated distance well.  Stairs            Wheelchair Mobility    Modified Rankin (Stroke Patients Only)       Balance Overall balance assessment: Needs assistance;History of Falls         Standing balance support: Bilateral upper extremity supported Standing balance-Leahy Scale: Fair                                Pertinent Vitals/Pain Pain Assessment: No/denies pain    Home Living Family/patient expects to be discharged to:: Private residence Living Arrangements: Alone Available Help at Discharge: Friend(s);Available PRN/intermittently Type of Home: House Home Access: Stairs to enter Entrance Stairs-Rails: None Entrance Stairs-Number of Steps: 2 Home Layout: One level Home Equipment: None      Prior Function Level of Independence: Independent with assistive device(s)         Comments: using rollator for ambulation     Hand Dominance   Dominant Hand: Right    Extremity/Trunk Assessment   Upper Extremity Assessment Upper Extremity Assessment: Generalized weakness    Lower Extremity Assessment Lower Extremity Assessment: Generalized weakness    Cervical / Trunk Assessment Cervical / Trunk Assessment: Normal  Communication   Communication: No difficulties  Cognition Arousal/Alertness: Awake/alert Behavior During Therapy: WFL for tasks assessed/performed Overall Cognitive Status: Within Functional Limits for tasks assessed                                        General Comments      Exercises     Assessment/Plan    PT Assessment Patient needs continued PT services  PT Problem List Decreased strength;Decreased mobility;Decreased balance;Decreased activity tolerance;Decreased knowledge of use of DME  PT Treatment Interventions DME instruction;Gait training;Therapeutic activities;Therapeutic exercise;Patient/family education;Balance training;Functional mobility training    PT Goals (Current goals can be found in the Care Plan section)  Acute Rehab PT Goals Patient Stated Goal: to get strong enough too safely manage at home alone PT Goal Formulation: With patient Time For Goal Achievement: 10/08/20 Potential to Achieve Goals: Good    Frequency Min 3X/week   Barriers to discharge        Co-evaluation               AM-PAC  PT "6 Clicks" Mobility  Outcome Measure Help needed turning from your back to your side while in a flat bed without using bedrails?: None Help needed moving from lying on your back to sitting on the side of a flat bed without using bedrails?: None Help needed moving to and from a bed to a chair (including a wheelchair)?: A Little Help needed standing up from a chair using your arms (e.g., wheelchair or bedside chair)?: A Little Help needed to walk in hospital room?: A Little Help needed climbing 3-5 steps with a railing? : A Little 6 Click Score: 20    End of Session Equipment Utilized During Treatment: Gait belt Activity Tolerance: Patient tolerated treatment well Patient left: in chair;with call bell/phone within reach   PT Visit Diagnosis: Muscle weakness (generalized) (M62.81);History of falling (Z91.81)    Time: 2010-0712 PT Time Calculation (min) (ACUTE ONLY): 23 min   Charges:   PT Evaluation $PT Eval Low Complexity: 1 Low PT Treatments $Gait Training: 8-22 mins          Faye Ramsay, PT Acute Rehabilitation  Office: [phone] Pager: [phone]

## 2020-09-24 NOTE — Progress Notes (Signed)
Progress Note     Subjective: Patient is s/p lap chole 11/11 by Dr. Ezzard Standing. He reports he has been doing well from this overall. Discharged 11/15 and readmitted 11/16 with generalized weakness, poor PO intake, nausea and emesis and mechanical fall at home.   On my exam he reports no RUQ pain. He denies nausea or vomiting. He is having bowel function and previous diarrhea is resolved.   Objective: Vital signs in last 24 hours: Temp:  [97.4 F (36.3 C)-98 F (36.7 C)] 97.4 F (36.3 C) (11/17 1343) Pulse Rate:  [73-91] 82 (11/17 1343) Resp:  [15-23] 16 (11/17 1343) BP: (106-158)/(60-99) 106/60 (11/17 1343) SpO2:  [94 %-100 %] 95 % (11/17 1343) Weight:  [109.7 kg] 109.7 kg (11/17 0130) Last BM Date: 09/23/20  Intake/Output from previous day: 11/16 0701 - 11/17 0700 In: 525 [I.V.:475; IV Piggyback:50] Out: 1 [Urine:1] Intake/Output this shift: Total I/O In: 1220 [P.O.:1115; I.V.:55; IV Piggyback:50] Out: 10 [Drains:10]  PE: Abd: soft, non-tender on palpation, +BS, ND, incisions c/d/i.  Drain in place with SS fluid.  Does not appear bilious today   Lab Results:  Recent Labs    09/23/20 1800 09/24/20 0426  WBC 11.6* 10.0  HGB 12.0* 11.3*  HCT 39.2 36.2*  PLT 385 325   BMET Recent Labs    09/23/20 1800 09/24/20 0426  NA 136 135  K 3.6 3.7  CL 101 103  CO2 26 25  GLUCOSE 138* 88  BUN 6* 6*  CREATININE 0.98 0.87  CALCIUM 8.6* 8.0*   PT/INR No results for input(s): LABPROT, INR in the last 72 hours. CMP     Component Value Date/Time   NA 135 09/24/2020 0426   NA 140 08/12/2020 0926   K 3.7 09/24/2020 0426   CL 103 09/24/2020 0426   CO2 25 09/24/2020 0426   GLUCOSE 88 09/24/2020 0426   BUN 6 (L) 09/24/2020 0426   BUN 18 08/12/2020 0926   CREATININE 0.87 09/24/2020 0426   CALCIUM 8.0 (L) 09/24/2020 0426   PROT 5.6 (L) 09/24/2020 0426   PROT 6.5 12/13/2019 0936   ALBUMIN 2.1 (L) 09/24/2020 0426   ALBUMIN 4.1 12/13/2019 0936   AST 18 09/24/2020 0426     ALT 18 09/24/2020 0426   ALKPHOS 184 (H) 09/24/2020 0426   BILITOT 1.0 09/24/2020 0426   BILITOT 1.8 (H) 12/13/2019 0936   GFRNONAA >60 09/24/2020 0426   GFRAA 97 08/12/2020 0926   Lipase     Component Value Date/Time   LIPASE 44 09/23/2020 1800       Studies/Results: CT ABDOMEN PELVIS W CONTRAST  Result Date: 09/23/2020 CLINICAL DATA:  Status post cholecystectomy with weakness. Anorexia. Nausea. Recent fall. EXAM: CT ABDOMEN AND PELVIS WITH CONTRAST TECHNIQUE: Multidetector CT imaging of the abdomen and pelvis was performed using the standard protocol following bolus administration of intravenous contrast. CONTRAST:  OMNIPAQUE IOHEXOL 300 MG/ML  SOLN COMPARISON:  09/15/2020 MRCP. 09/17/2020 ERCP. Abdominopelvic CT 09/14/2020. FINDINGS: Lower chest: Mild right base atelectasis with calcified right sided granulomas. Left lower lobe collapse with increase in moderate left and small right pleural effusions. Mild cardiomegaly with prior median sternotomy and native coronary artery atherosclerosis. Hepatobiliary: Minimal pneumobilia. Resolved intrahepatic biliary duct dilatation. Probable focal fat adjacent the falciform ligament. Cholecystectomy. Ill-defined fluid and edema within the operative bed. Example at 4.6 x 2.8 cm on 31/2. More caudally, contiguous somewhat ill-defined fluid anterior to the pylorus measures 3.0 x 2.5 cm on 37/2. Surgical drain terminates in  the operative bed. Pancreas: Normal, without mass or ductal dilatation. Spleen: Normal in size, without focal abnormality. Adrenals/Urinary Tract: Normal adrenal glands. A stone at the left ureteropelvic junction measures 6 mm on 41/2, in the left renal pelvis on the prior. Development of right-sided mild hydroureteronephrosis with decreased excretion of contrast from the right kidney. A mid right ureteric 3 mm stone is most apparent on coronal image 72. The right-side of the bladder enters an inguinal hernia, similar.  Stomach/Bowel: Normal stomach, without wall thickening. Normal colon, appendix, and terminal ileum. Normal abdominal small bowel. Vascular/Lymphatic: Aortic atherosclerosis. No abdominopelvic adenopathy. Reproductive: Normal prostate. Other: Right inguinal hernia containing fat and a portion of the bladder, similar. No significant free fluid. No free intraperitoneal air. Minimal air within the anterior subcutaneous abdominal wall is likely postoperative. Musculoskeletal: Subtle left femoral head avascular necrosis. IMPRESSION: 1. Status post cholecystectomy with ill-defined fluid and edema in the operative bed. Considerations include phlegmon/developing abscess, biloma, and postoperative hematoma. 2. Development of right-sided urinary tract obstruction secondary to a punctate mid right ureteric stone. 3. Left-sided stone at the ureteropelvic junction, without significant hydronephrosis. 4. Increase in left larger than right pleural effusions with progressive left base atelectasis. 5. Fat and right-side of the bladder within a inguinal hernia, as before. 6.  Aortic Atherosclerosis (ICD10-I70.0). Electronically Signed   By: Jeronimo Greaves M.D.   On: 09/23/2020 19:22   DG Chest Port 1 View  Result Date: 09/23/2020 CLINICAL DATA:  Pleural effusions, post gallbladder surgery 09/22/2020. Increasing weakness and mechanical fall EXAM: PORTABLE CHEST 1 VIEW COMPARISON:  Radiograph 09/14/2020 FINDINGS: Enlarging left pleural effusion with adjacent opacity likely reflecting passive atelectasis though underlying infection or edema is not fully excluded. There is some gradient opacity in the right lung as well which could reflect atelectasis, edema or early airspace disease. Trace peripheral right pleural thickening may reflect trace effusion as well. No pneumothorax. Postsurgical changes related to prior CABG including intact and aligned sternotomy wires and multiple surgical clips projecting over the mediastinum. Atrial  occluder device is noted. The aorta is calcified. The remaining cardiomediastinal contours are unremarkable. Telemetry leads overlie the chest. No acute osseous or soft tissue abnormality. IMPRESSION: 1. Enlarging left pleural effusion with adjacent opacity likely reflecting passive atelectasis though underlying infection or edema is not fully excluded. 2. Vascular congestion and gradient opacity in the right lung which is favored to be a combination of atelectasis and edema. Trace right effusion as well. 3.  Aortic Atherosclerosis (ICD10-I70.0). Electronically Signed   By: Kreg Shropshire M.D.   On: 09/23/2020 20:13    Anti-infectives: Anti-infectives (From admission, onward)   Start     Dose/Rate Route Frequency Ordered Stop   09/24/20 0800  piperacillin-tazobactam (ZOSYN) IVPB 3.375 g        3.375 g 12.5 mL/hr over 240 Minutes Intravenous Every 8 hours 09/24/20 0030     09/24/20 0030  piperacillin-tazobactam (ZOSYN) IVPB 3.375 g        3.375 g 100 mL/hr over 30 Minutes Intravenous  Once 09/24/20 0006 09/24/20 0138   09/24/20 0015  fluconazole (DIFLUCAN) tablet 150 mg       Note to Pharmacy: Budding yeast urine with some urinary symptoms who will likely need to undergo cystoscopy.   150 mg Oral Daily 09/24/20 0010         Assessment/Plan CAD- s/pCABGx 49/21/2021byDr. Z Atkins.  PAF-on Xarelto Hypertension Diarrhea- resolved since surgery  Enlarging L pleural effusion Left-sided kidney stone at the ureteropelvic junction -  no significant hydronephrosis but could be contributing to symptoms   Infarcted gallbladder with cholelithiasis Choledocholithiasis S/p ERCP/sphincterotomy/balloon extraction biliary stones 09/17/2020 Dr. Claudette Head POD 6, S/p laparoscopic partial cholecystectomy/drain placement 09/18/2020 Dr. Ovidio Kin WBC mildly elevated at 11.6 on admit, now normalized LFT's okay, Tbili 1.0  At this point I do  not think this is a surgical complication or issue. I think phlegmonous tissue in operative bed is likely the back wall of the gallbladder given that patient had subtotal cholecystectomy. Drain is not frankly bilious so I am not currently suspicious for bile leak. We will repeat labs in the AM. If Tbili markedly elevated or drainage appears more frankly bilious then patient will need a HIDA to asses for bile leak.   Would recommend treating patient's significant L pleural effusion and kidney stone as that is likely to be causing symptoms of fatigue.                FEN: soft diet, IVF ID: was discharged on PO augmentin; PO fluconazole 11/17>>, Zosyn 11/17>> [email or web address]  Follow-up: [facility]  LOS: 0 days    Juliet Rude , [facility] 09/24/2020, 4:30 PM Please see Amion for pager number during day hours 7:00am-4:30pm

## 2020-09-24 NOTE — Progress Notes (Signed)
PROGRESS NOTE    [name] Rice  QBH:[id number] DOB: September 05, 1951 DOA: 09/23/2020 PCP: [name], PA-C   Brief Narrative:  HPI per [name] on 09/23/20 [name] Rice is a 69 y.o. male with medical history significant of chronic atrial fibrillation, CAD, s/p CABG almost 2 months ago, LBBB, chronic systolic heart failure of 45 to 50% per recent echocardiogram who was recently admitted and discharged from 09/14/2020 to 09/22/2020 for acute cholecystitis requiring ERCP with successful extraction of a stone, followed by partial laparoscopic cholecystectomy with JP drainage placement who returns to the hospital due to generalized weakness associated with decreased oral intake, abdominal pain, nausea, 2 episodes of emesis and a history of a mechanical fall at home without any significant injury. He states his diarrhea has gotten better in the past few days. He denies fever, but complains of chills and fatigue. He denies rhinorrhea, sore throat, wheezing or hemoptysis. No chest pain, palpitations, diaphoresis, PND or orthopnea. He gets occasional lower extremity edema, but states that this has improved significantly. He denies constipation, melena or hematochezia. He has had mild dysuria along with right flank pain, but no frequency, oliguria or hematuria. No polyuria, polydipsia, polyphagia or blurred vision.  ED Course: Initial vital signs were temperature 97.8 F, pulse 86, respirations 16, blood pressure 143/99 mmHg O2 sat 98% on room air.  Labs: Coronavirus and influenza PCR was negative.  Urine analysis showed large hemoglobinuria with more than 50 RBC microscopic examination.  Ketonuria 5 and proteinuria 30 mg/dL.  There was trace leukocyte esterase with 21-50 WBCs per hpf.  There were a few bacteria, mucus, but engaged and hyaline casts.  Imaging: 1 view chest radiograph shows enlarging left pleural effusion likely reflecting passive atelectasis though underlying infection or edema  could not be fully excluded.  There is vascular congestion and grading opacity in the right lung which is favored to be a combination of atelectasis and edema.  There is trace right pleural effusion as well.  CT abdomen/pelvis with contrast shows status post cholecystectomy with ill-defined fluid and edema in the operative bed.  Considerations include phlegmon, developing abscess, biloma and postoperative hematoma.  Development of right-sided urinary tract obstruction secondary to a punctate mid right ureteric stone treated close to left-sided stone at the ureteropelvic junction without significant hydronephrosis.  There is atelectasis.  There is fat and right side of the bladder within an inguinal hernia.  Please see images and full radiology report.  **Interim History  His diet was advanced to clear liquid diet and will go to soft diet.  Dietitian was consulted for further evaluation we will add probiotics.  Patient states that he feels generally weak  Assessment & Plan:   Principal Problem:   Nausea and vomiting Active Problems:   Atrial fibrillation (HCC)   Pure hypercholesterolemia   Malnutrition of moderate degree (HCC)   Acute blood loss anemia   CAD, multiple vessel   Benign hypertension   Right sided hydronephrosis with renal and ureteral calculus obstruction   Budding yeast in U/A detected  Mild Right sided hydronephrosis with renal and ureteral calculus obstruction and ? UTI poA -Continue IV hydration and reduce the Rate to 75 mL/hr; Has development of Right Sideded Urinary Tract Obstruction due to Punctate Mid Ureteric Stone -Tamsulosin 0.4 mg p.o. twice daily and will change to Daily  -Discussed with Urology Dr. Gwynneth Macleod and he will evaluate  -leukocytosis on admission of 11.6 which is now improved to 10.0 but could be in  the setting of dehydration-patient's urinalysis showed a hazy appearance with large hemoglobin, 5 ketones, trace leukocytes, negative nitrites, few bacteria,  greater than 50 RBCs per high-power field, and 21-50 WBCs; urine culture still pending -C/w with Empiric Abx with IV Zosyn for now as it will also cover him for his gallbladder issues  Generalized weakness and poor p.o. intake -In the setting of dehydration and poor p.o. intake and recent gallbladder issues and possible UTI -Continue IV fluid hydration -PT OT to further evaluate and treat  Budding yeast in U/A detected -Will treat given symptoms and possible need for urological procedure; urology feels that no surgical intervention is warranted at this time but they will come evaluate the patient later on -Fluconazole 150 mg p.o. daily.  Fluid and Edema in the Operative Bed -? Phlegmon/Developing Abscess, Biloma, Postoperative Hematoma -Will discuss with General Surgery   Atrial fibrillation, chronic (HCC) -CHA?DS?-VASc Score of at least 4. -Continue metoprolol 12.5 mg p.o. daily. -Continue Xarelto for anticoagulation. -Continue to monitor on telemetry  Acquired Thrombophilia -Think of his atrial fibrillation -Patient's CHA2DS2-VASc is 4 -Continue with anticoagulation with Xarelto  Pure Hypercholesterolemia -Hold rosuvastatin while on fluconazole.  Malnutrition of moderate degree (HCC) -Dietitian was consulted for further evaluation recommendations -They are recommending Ensure Enlive p.o. twice daily -Continue with multivitamin with minerals daily -Diet has been advanced from a clear liquid diet to soft -We will add a probiotic as well  Normocytic anemia -In the setting of 2 recent surgeries. -Hemoglobin higher than baseline. -Continue monitor hemoglobin/hematocrit and monitor for signs and symptoms of bleeding; currently no overt bleeding noted -Repeat CBC in a.m.  CAD, multiple vessel -Continue beta-blocker and Xarelto. -Holding a statin while using fluconazole.  Benign hypertension -He has been holding amlodipine and olmesartan. -Continue metoprolol 12.5 mg  p.o. daily. -Monitor blood pressure and heart rate. -Blood pressures on the lower side at 106/60 -Continue monitor blood pressure per protocol  Obesity -Complicates overall prognosis and Care -Estimated body mass index is 31.05 kg/m as calculated from the following:   Height as of this encounter: 6\' 2"  (1.88 m).   Weight as of this encounter: 109.7 kg. -Weight Loss and Dietary Counseling given   DVT prophylaxis: Anticoagulated with Xarelto Code Status: FULL CODE  Family Communication: No family present at bedside Disposition Plan: Pending further clinical improvement and tolerance of p.o. diet and evaluation by PT and OT  Status is: Observation  The patient will require care spanning > 2 midnights and should be moved to inpatient because: Unsafe d/c plan, IV treatments appropriate due to intensity of illness or inability to take PO and Inpatient level of care appropriate due to severity of illness  Dispo: The patient is from: Home              Anticipated d/c is to: TBD              Anticipated d/c date is: 2 days              Patient currently is not medically stable to d/c.  Consultants:   Urology  General Surgery   Procedures: None  Antimicrobials:  Anti-infectives (From admission, onward)   Start     Dose/Rate Route Frequency Ordered Stop   09/24/20 0800  piperacillin-tazobactam (ZOSYN) IVPB 3.375 g        3.375 g 12.5 mL/hr over 240 Minutes Intravenous Every 8 hours 09/24/20 0030     09/24/20 0030  piperacillin-tazobactam (ZOSYN) IVPB 3.375 g  3.375 g 100 mL/hr over 30 Minutes Intravenous  Once 09/24/20 0006 09/24/20 0138   09/24/20 0015  fluconazole (DIFLUCAN) tablet 150 mg       Note to Pharmacy: Budding yeast urine with some urinary symptoms who will likely need to undergo cystoscopy.   150 mg Oral Daily 09/24/20 0010          Subjective: Seen And examined and was still feeling weak and states that his appetite is not too great.  He states his  diarrhea for months has now resolved.  No chest pain, lightheadedness or dizziness but just does not feel well and has a very poor appetite.  Denies any lightheadedness or dizziness.  No other concerns or complaints at this time.  Objective: Vitals:   09/24/20 0030 09/24/20 0130 09/24/20 0131 09/24/20 0530  BP: (!) 151/88  136/73 134/85  Pulse: 82  84 84  Resp: 15  18   Temp:   98 F (36.7 C) 97.6 F (36.4 C)  TempSrc:   Oral Oral  SpO2: 96%  100% 98%  Weight:  109.7 kg    Height:   (1.88 m)      Intake/Output Summary (Last 24 hours) at 09/24/2020 1210 Last data filed at 09/24/2020 0930 Gross per 24 hour  Intake 1010 ml  Output 1 ml  Net 1009 ml   Filed Weights   09/24/20 0130  Weight: 109.7 kg   Examination: Physical Exam:  Constitutional: WN/WD obese Caucasian male in NAD and appears calm and comfortable Eyes: Lids and conjunctivae normal, sclerae anicteric  ENMT: External Ears, Nose appear normal. Grossly normal hearing.   Neck: Appears normal, supple, no cervical masses, normal ROM, no appreciable thyromegaly; no JVD Respiratory: Clear to auscultation bilaterally, no wheezing, rales, rhonchi or crackles. Normal respiratory effort and patient is not tachypenic. No accessory muscle use.  Cardiovascular: RRR, no murmurs / rubs / gallops. S1 and S2 auscultated. Trace extremity edema. 2+ pedal pulses. No carotid bruits.  Abdomen: Soft, Slightly-tender, Distended 2/2 body habitus. Drain in place Bowel sounds positive.  GU: Deferred. Musculoskeletal: No clubbing / cyanosis of digits/nails. No joint deformity upper and lower extremities.  Skin: No rashes, lesions, ulcers on a limited skin evaluation. No induration; Warm and dry.  Neurologic: CN 2-12 grossly intact with no focal deficits. Romberg sign and cerebellar reflexes not assessed.  Psychiatric: Normal judgment and insight. Alert and oriented x 3. Normal mood and appropriate affect.   Data Reviewed: I have  personally reviewed following labs and imaging studies  CBC: Recent Labs  Lab 09/20/20 0559 09/21/20 0532 09/22/20 0434 09/23/20 1800 09/24/20 0426  WBC 13.7* 11.9* 10.1 11.6* 10.0  NEUTROABS  --   --   --  8.9*  --   HGB 11.2* 10.3* 10.7* 12.0* 11.3*  HCT 38.0* 34.8* 34.9* 39.2 36.2*  MCV 95.7 96.1 92.1 92.5 91.6  PLT 324 295 309 385 325   Basic Metabolic Panel: Recent Labs  Lab 09/18/20 0439 09/19/20 0831 09/20/20 0559 09/21/20 0532 09/22/20 0434 09/23/20 1800 09/24/20 0426  NA 138   < > 136 135 137 136 135  K 4.7   < > 3.9 3.2* 3.1* 3.6 3.7  CL 108   < > 106 104 105 101 103  CO2 23   < > 21* GLUCOSE 150*   < > 99 99 99 138* 88  BUN 9   < > 9 9 7* 6* 6*  CREATININE 1.44*   < >  1.04 0.99 0.81 0.98 0.87  CALCIUM 8.0*   < > 8.1* 7.9* 8.1* 8.6* 8.0*  MG 1.7  --   --   --   --   --   --    < > = values in this interval not displayed.   GFR: Estimated Creatinine Clearance: 108.6 mL/min (by C-G formula based on SCr of 0.87 mg/dL). Liver Function Tests: Recent Labs  Lab 09/20/20 0559 09/21/20 0532 09/22/20 0434 09/23/20 1800 09/24/20 0426  AST 21 16 14* 21 18  ALT 32 24 21 20 18   ALKPHOS 338* 259* 223* 236* 184*  BILITOT 1.1 0.9 1.0 1.2 1.0  PROT 6.0* 5.5* 5.4* 6.5 5.6*  ALBUMIN 2.3* 2.1* 2.1* 2.6* 2.1*   Recent Labs  Lab 09/18/20 0439 09/23/20 1800  LIPASE 40 44   No results for input(s): AMMONIA in the last 168 hours. Coagulation Profile: No results for input(s): INR, PROTIME in the last 168 hours. Cardiac Enzymes: No results for input(s): CKTOTAL, CKMB, CKMBINDEX, TROPONINI in the last 168 hours. BNP (last 3 results) No results for input(s): PROBNP in the last 8760 hours. HbA1C: No results for input(s): HGBA1C in the last 72 hours. CBG: No results for input(s): GLUCAP in the last 168 hours. Lipid Profile: No results for input(s): CHOL, HDL, LDLCALC, TRIG, CHOLHDL, LDLDIRECT in the last 72 hours. Thyroid Function Tests: No results  for input(s): TSH, T4TOTAL, FREET4, T3FREE, THYROIDAB in the last 72 hours. Anemia Panel: No results for input(s): VITAMINB12, FOLATE, FERRITIN, TIBC, IRON, RETICCTPCT in the last 72 hours. Sepsis Labs: No results for input(s): PROCALCITON, LATICACIDVEN in the last 168 hours.  Recent Results (from the past 240 hour(s))  C Difficile Quick Screen w PCR reflex     Status: None   Collection Time: 09/14/20  2:20 PM   Specimen: STOOL  Result Value Ref Range Status   C Diff antigen NEGATIVE NEGATIVE Final   C Diff toxin NEGATIVE NEGATIVE Final   C Diff interpretation No C. difficile detected.  Final    Comment: Performed at [facility], [address], [place], [place]  Respiratory Panel by RT PCR (Flu A&B, Covid) - Nasopharyngeal Swab     Status: None   Collection Time: 09/14/20  4:04 PM   Specimen: Nasopharyngeal Swab  Result Value Ref Range Status   SARS Coronavirus 2 by RT PCR NEGATIVE NEGATIVE Final    Comment: (NOTE) SARS-CoV-2 target nucleic acids are NOT DETECTED.  The SARS-CoV-2 RNA is generally detectable in upper respiratoy specimens during the acute phase of infection. The lowest concentration of SARS-CoV-2 viral copies this assay can detect is 131 copies/mL. A negative result does not preclude SARS-Cov-2 infection and should not be used as the sole basis for treatment or other patient management decisions. A negative result may occur with  improper specimen collection/handling, submission of specimen other than nasopharyngeal swab, presence of viral mutation(s) within the areas targeted by this assay, and inadequate number of viral copies (<131 copies/mL). A negative result must be combined with clinical observations, patient history, and epidemiological information. The expected result is Negative.  Fact Sheet for Patients:  13/07/21  Fact Sheet for Healthcare Providers:  https://www.moore.com/  This  test is no t yet approved or cleared by the https://www.young.biz/ FDA and  has been authorized for detection and/or diagnosis of SARS-CoV-2 by FDA under an Emergency Use Authorization (EUA). This EUA will remain  in effect (meaning this test can be used) for the duration  of the COVID-19 declaration under Section 564(b)(1) of the Act, 21 U.S.C. section 360bbb-3(b)(1), unless the authorization is terminated or revoked sooner.     Influenza A by PCR NEGATIVE NEGATIVE Final   Influenza B by PCR NEGATIVE NEGATIVE Final    Comment: (NOTE) The Xpert Xpress SARS-CoV-2/FLU/RSV assay is intended as an aid in  the diagnosis of influenza from Nasopharyngeal swab specimens and  should not be used as a sole basis for treatment. Nasal washings and  aspirates are unacceptable for Xpert Xpress SARS-CoV-2/FLU/RSV  testing.  Fact Sheet for Patients: https://www.moore.com/  Fact Sheet for Healthcare Providers: https://www.young.biz/  This test is not yet approved or cleared by the [place] FDA and  has been authorized for detection and/or diagnosis of SARS-CoV-2 by  FDA under an Emergency Use Authorization (EUA). This EUA will remain  in effect (meaning this test can be used) for the duration of the  Covid-19 declaration under Section 564(b)(1) of the Act, 21  U.S.C. section 360bbb-3(b)(1), unless the authorization is  terminated or revoked. Performed at [facility], [address] [place], [place] [address]   Resp Panel by RT PCR (RSV, Flu A&B, Covid) - Nasopharyngeal Swab     Status: None   Collection Time: 09/23/20  8:01 PM   Specimen: Nasopharyngeal Swab  Result Value Ref Range Status   SARS Coronavirus 2 by RT PCR NEGATIVE NEGATIVE Final    Comment: (NOTE) SARS-CoV-2 target nucleic acids are NOT DETECTED.  The SARS-CoV-2 RNA is generally detectable in upper respiratoy specimens during the acute phase of infection. The lowest concentration  of SARS-CoV-2 viral copies this assay can detect is 131 copies/mL. A negative result does not preclude SARS-Cov-2 infection and should not be used as the sole basis for treatment or other patient management decisions. A negative result may occur with  improper specimen collection/handling, submission of specimen other than nasopharyngeal swab, presence of viral mutation(s) within the areas targeted by this assay, and inadequate number of viral copies (<131 copies/mL). A negative result must be combined with clinical observations, patient history, and epidemiological information. The expected result is Negative.  Fact Sheet for Patients:  https://www.moore.com/  Fact Sheet for Healthcare Providers:  https://www.young.biz/  This test is no t yet approved or cleared by the [place] FDA and  has been authorized for detection and/or diagnosis of SARS-CoV-2 by FDA under an Emergency Use Authorization (EUA). This EUA will remain  in effect (meaning this test can be used) for the duration of the COVID-19 declaration under Section 564(b)(1) of the Act, 21 U.S.C. section 360bbb-3(b)(1), unless the authorization is terminated or revoked sooner.     Influenza A by PCR NEGATIVE NEGATIVE Final   Influenza B by PCR NEGATIVE NEGATIVE Final    Comment: (NOTE) The Xpert Xpress SARS-CoV-2/FLU/RSV assay is intended as an aid in  the diagnosis of influenza from Nasopharyngeal swab specimens and  should not be used as a sole basis for treatment. Nasal washings and  aspirates are unacceptable for Xpert Xpress SARS-CoV-2/FLU/RSV  testing.  Fact Sheet for Patients: https://www.moore.com/  Fact Sheet for Healthcare Providers: https://www.young.biz/  This test is not yet approved or cleared by the [place] FDA and  has been authorized for detection and/or diagnosis of SARS-CoV-2 by  FDA under an Emergency Use  Authorization (EUA). This EUA will remain  in effect (meaning this test can be used) for the duration of the  Covid-19 declaration under Section 564(b)(1) of the Act, 21  U.S.C. section 360bbb-3(b)(1), unless the authorization is  terminated or revoked.    Respiratory Syncytial Virus by PCR NEGATIVE NEGATIVE Final    Comment: (NOTE) Fact Sheet for Patients: https://www.moore.com/  Fact Sheet for Healthcare Providers: https://www.young.biz/  This test is not yet approved or cleared by the [place] FDA and  has been authorized for detection and/or diagnosis of SARS-CoV-2 by  FDA under an Emergency Use Authorization (EUA). This EUA will remain  in effect (meaning this test can be used) for the duration of the  COVID-19 declaration under Section 564(b)(1) of the Act, 21 U.S.C.  section 360bbb-3(b)(1), unless the authorization is terminated or  revoked. Performed at [facility], [address] [place], [place] [address]      RN Pressure Injury Documentation:     Estimated body mass index is 31.05 kg/m as calculated from the following:   Height as of this encounter:  (1.88 m).   Weight as of this encounter: 109.7 kg.  Malnutrition Type:   Malnutrition Characteristics:   Nutrition Interventions:    Radiology Studies: CT ABDOMEN PELVIS W CONTRAST  Result Date: 09/23/2020 CLINICAL DATA:  Status post cholecystectomy with weakness. Anorexia. Nausea. Recent fall. EXAM: CT ABDOMEN AND PELVIS WITH CONTRAST TECHNIQUE: Multidetector CT imaging of the abdomen and pelvis was performed using the standard protocol following bolus administration of intravenous contrast. CONTRAST:  OMNIPAQUE IOHEXOL 300 MG/ML  SOLN COMPARISON:  09/15/2020 MRCP. 09/17/2020 ERCP. Abdominopelvic CT 09/14/2020. FINDINGS: Lower chest: Mild right base atelectasis with calcified right sided granulomas. Left lower lobe collapse with increase in  moderate left and small right pleural effusions. Mild cardiomegaly with prior median sternotomy and native coronary artery atherosclerosis. Hepatobiliary: Minimal pneumobilia. Resolved intrahepatic biliary duct dilatation. Probable focal fat adjacent the falciform ligament. Cholecystectomy. Ill-defined fluid and edema within the operative bed. Example at 4.6 x 2.8 cm on 31/2. More caudally, contiguous somewhat ill-defined fluid anterior to the pylorus measures 3.0 x 2.5 cm on 37/2. Surgical drain terminates in the operative bed. Pancreas: Normal, without mass or ductal dilatation. Spleen: Normal in size, without focal abnormality. Adrenals/Urinary Tract: Normal adrenal glands. A stone at the left ureteropelvic junction measures 6 mm on 41/2, in the left renal pelvis on the prior. Development of right-sided mild hydroureteronephrosis with decreased excretion of contrast from the right kidney. A mid right ureteric 3 mm stone is most apparent on coronal image 72. The right-side of the bladder enters an inguinal hernia, similar. Stomach/Bowel: Normal stomach, without wall thickening. Normal colon, appendix, and terminal ileum. Normal abdominal small bowel. Vascular/Lymphatic: Aortic atherosclerosis. No abdominopelvic adenopathy. Reproductive: Normal prostate. Other: Right inguinal hernia containing fat and a portion of the bladder, similar. No significant free fluid. No free intraperitoneal air. Minimal air within the anterior subcutaneous abdominal wall is likely postoperative. Musculoskeletal: Subtle left femoral head avascular necrosis. IMPRESSION: 1. Status post cholecystectomy with ill-defined fluid and edema in the operative bed. Considerations include phlegmon/developing abscess, biloma, and postoperative hematoma. 2. Development of right-sided urinary tract obstruction secondary to a punctate mid right ureteric stone. 3. Left-sided stone at the ureteropelvic junction, without significant hydronephrosis. 4.  Increase in left larger than right pleural effusions with progressive left base atelectasis. 5. Fat and right-side of the bladder within a inguinal hernia, as before. 6.  Aortic Atherosclerosis (ICD10-I70.0). Electronically Signed   By: Jeronimo Greaves M.D.   On: 09/23/2020 19:22   DG Chest Port 1 View  Result Date: 09/23/2020 CLINICAL DATA:  Pleural effusions, post  gallbladder surgery 09/22/2020. Increasing weakness and mechanical fall EXAM: PORTABLE CHEST 1 VIEW COMPARISON:  Radiograph 09/14/2020 FINDINGS: Enlarging left pleural effusion with adjacent opacity likely reflecting passive atelectasis though underlying infection or edema is not fully excluded. There is some gradient opacity in the right lung as well which could reflect atelectasis, edema or early airspace disease. Trace peripheral right pleural thickening may reflect trace effusion as well. No pneumothorax. Postsurgical changes related to prior CABG including intact and aligned sternotomy wires and multiple surgical clips projecting over the mediastinum. Atrial occluder device is noted. The aorta is calcified. The remaining cardiomediastinal contours are unremarkable. Telemetry leads overlie the chest. No acute osseous or soft tissue abnormality. IMPRESSION: 1. Enlarging left pleural effusion with adjacent opacity likely reflecting passive atelectasis though underlying infection or edema is not fully excluded. 2. Vascular congestion and gradient opacity in the right lung which is favored to be a combination of atelectasis and edema. Trace right effusion as well. 3.  Aortic Atherosclerosis (ICD10-I70.0). Electronically Signed   By: Kreg Shropshire M.D.   On: 09/23/2020 20:13   Scheduled Meds:  fluconazole  150 mg Oral Daily   metoprolol tartrate  12.5 mg Oral q AM   pantoprazole (PROTONIX) IV  40 mg Intravenous Q24H   rivaroxaban  20 mg Oral Q supper   tamsulosin  0.4 mg Oral Daily   Continuous Infusions:  piperacillin-tazobactam (ZOSYN)   IV 3.375 g (09/24/20 0948)    LOS: 0 days    Merlene Laughter, DO [facility] PAGER is on AMION  If 7PM-7AM, please contact night-coverage [email or web address]

## 2020-09-24 NOTE — Plan of Care (Signed)

## 2020-09-24 NOTE — Progress Notes (Signed)
Initial Nutrition Assessment  DOCUMENTATION CODES:   Not applicable  INTERVENTION:  - will order Ensure Enlive BID, each supplement provides 350 kcal and 20 grams of protein. - will order 1 tablet multivitamin with minerals/day. - recommend daily probiotic and advancing diet from CLD to Soft.    NUTRITION DIAGNOSIS:   Inadequate oral intake related to poor appetite, other (see comment) as evidenced by per patient/family report.  GOAL:   Patient will meet greater than or equal to 90% of their needs  MONITOR:   PO intake, Supplement acceptance, Diet advancement, Labs, Weight trends  REASON FOR ASSESSMENT:   Malnutrition Screening Tool, Consult Assessment of nutrition requirement/status, Poor PO  ASSESSMENT:   69 y.o. male with medical history of chronic afib, CAD, s/p CABG almost 2 months ago (07/2020), LBBB, HTN, and CHF. He was admitted 11/7-11/15 due to acute cholecystitis requiring ERCP and partial lap chole. He presented to the ED on 11/16 due to weakness with associated fall, decreased oral intake, abdominal pain, nausea, emesis x2.  Diet advanced from NPO to CLD today at 37 and patient ate 100% of lunch meal (254 kcal, 1 gram protein).   Patient recounts medical course over the past 2-3 months. He began experiencing a decrease in appetite even before cardiac surgery in September and poor appetite has persisted since that time. Following cardiac surgery, he has also been experiencing taste alteration (states the closest thing he can compare it to is bland taste of once favorite items, all items in general).   He denies any chewing or swallowing difficulties. More than early satiety, he has been experiencing nausea when he eats more than a few bites at a time.   From the time of cardiac surgery in September until Sunday, 11/14, he was experiencing persistent diarrhea. He states that since Sunday stools have not only been water.   Currently on CLD. He does not like Boost  Breeze but does like chocolate and vanilla Ensure and would like to receive this supplement.   Talked with patient about likely persistent effects (taste alteration, diarrhea) of high dose abx at the time of surgery in September and earlier this month. Also discussed the pathophysiology of disease and infection and how this alone can affect appetite. Discussed the importance of adequate nutrition for healing and maintaining and building strength and how to make this feasible despite nausea.  Patient reported that he previously could ambulate unassisted but that when he went home on Monday he needed his brother to help him and that he fell going in to the house. He stated that yesterday he was unable to stand up from the chair to even attempt to walk.   Weight today is 242 lb and weight on 10/12 was 255 lb. This indicate 13 lb weight loss (5% body weight) in the past 1 month.   Patient does not currently meet criteria for malnutrition, but is at very high risk.    Labs reviewed; BUN: 6 mg/dl, Ca: 8 mg/dl, Alk Phos elevated.  Medications reviewed.  IVF; NS-20 mEq IV KCl @ 75 ml/hr.     NUTRITION - FOCUSED PHYSICAL EXAM:    Most Recent Value  Orbital Region No depletion  Upper Arm Region No depletion  Thoracic and Lumbar Region No depletion  Buccal Region No depletion  Temple Region No depletion  Clavicle Bone Region No depletion  Clavicle and Acromion Bone Region No depletion  Scapular Bone Region Unable to assess  Dorsal Hand No depletion  Patellar Region  No depletion  Anterior Thigh Region No depletion  Posterior Calf Region No depletion  Edema (RD Assessment) None  Hair Reviewed  Eyes Reviewed  Mouth Reviewed  Skin Reviewed  Nails Reviewed       Diet Order:   Diet Order            Diet clear liquid Room service appropriate? Yes; Fluid consistency: Thin  Diet effective now                 EDUCATION NEEDS:   No education needs have been identified at this  time  Skin:  Skin Assessment: Reviewed RN Assessment  Last BM:  11/17 (type 6)  Height:   Ht Readings from Last 1 Encounters:  09/24/20 '6\' 2"'  (1.88 m)    Weight:   Wt Readings from Last 1 Encounters:  09/24/20 109.7 kg    Estimated Nutritional Needs:  Kcal:  2300-2500 kcal Protein:  115-125 grams Fluid:  >/= 2.4 L/day     Jarome Matin, MS, RD, LDN, CNSC Inpatient Clinical Dietitian RD pager # available in AMION  After hours/weekend pager # available in [facility]

## 2020-09-24 NOTE — Progress Notes (Signed)
Pt resting in bed this am, reports he fell walking into his house, using the rotator the hospital sent home by home health, his brother walking along side of pt. Pt reports, he missed one of the three steps going into his house. Pt reports is felt weaker once he arrived home. Will provide support for the patient as needed. SRP, RN

## 2020-09-24 NOTE — Plan of Care (Signed)
  Problem: Education: Goal: Knowledge of General Education information will improve Description: Including pain rating scale, medication(s)/side effects and non-pharmacologic comfort measures Outcome: Progressing   Problem: Coping: Goal: Level of anxiety will decrease Outcome: Progressing   

## 2020-09-25 ENCOUNTER — Inpatient Hospital Stay (HOSPITAL_COMMUNITY): Payer: Medicare Other

## 2020-09-25 DIAGNOSIS — J90 Pleural effusion, not elsewhere classified: Secondary | ICD-10-CM

## 2020-09-25 LAB — CBC WITH DIFFERENTIAL/PLATELET
Abs Immature Granulocytes: 0.12 10*3/uL — ABNORMAL HIGH (ref 0.00–0.07)
Basophils Absolute: 0.1 10*3/uL (ref 0.0–0.1)
Basophils Relative: 1 %
Eosinophils Absolute: 1.1 10*3/uL — ABNORMAL HIGH (ref 0.0–0.5)
Eosinophils Relative: 12 %
HCT: 33.3 % — ABNORMAL LOW (ref 39.0–52.0)
Hemoglobin: 10 g/dL — ABNORMAL LOW (ref 13.0–17.0)
Immature Granulocytes: 1 %
Lymphocytes Relative: 16 %
Lymphs Abs: 1.5 10*3/uL (ref 0.7–4.0)
MCH: 28.2 pg (ref 26.0–34.0)
MCHC: 30 g/dL (ref 30.0–36.0)
MCV: 94.1 fL (ref 80.0–100.0)
Monocytes Absolute: 1.2 10*3/uL — ABNORMAL HIGH (ref 0.1–1.0)
Monocytes Relative: 13 %
Neutro Abs: 5.2 10*3/uL (ref 1.7–7.7)
Neutrophils Relative %: 57 %
Platelets: 313 10*3/uL (ref 150–400)
RBC: 3.54 MIL/uL — ABNORMAL LOW (ref 4.22–5.81)
RDW: 17.3 % — ABNORMAL HIGH (ref 11.5–15.5)
WBC: 9.2 10*3/uL (ref 4.0–10.5)
nRBC: 0 % (ref 0.0–0.2)

## 2020-09-25 LAB — BODY FLUID CELL COUNT WITH DIFFERENTIAL
Eos, Fluid: 1 %
Lymphs, Fluid: 50 %
Monocyte-Macrophage-Serous Fluid: 43 % — ABNORMAL LOW (ref 50–90)
Neutrophil Count, Fluid: 6 % (ref 0–25)
Total Nucleated Cell Count, Fluid: 1109 cu mm — ABNORMAL HIGH (ref 0–1000)

## 2020-09-25 LAB — PROTEIN, PLEURAL OR PERITONEAL FLUID: Total protein, fluid: <3 g/dL

## 2020-09-25 LAB — URINE CULTURE: Culture: NO GROWTH

## 2020-09-25 LAB — COMPREHENSIVE METABOLIC PANEL
ALT: 16 U/L (ref 0–44)
AST: 18 U/L (ref 15–41)
Albumin: 2.1 g/dL — ABNORMAL LOW (ref 3.5–5.0)
Alkaline Phosphatase: 150 U/L — ABNORMAL HIGH (ref 38–126)
Anion gap: 8 (ref 5–15)
BUN: 5 mg/dL — ABNORMAL LOW (ref 8–23)
CO2: 26 mmol/L (ref 22–32)
Calcium: 7.9 mg/dL — ABNORMAL LOW (ref 8.9–10.3)
Chloride: 102 mmol/L (ref 98–111)
Creatinine, Ser: 1.02 mg/dL (ref 0.61–1.24)
GFR, Estimated: >60 mL/min (ref >60)
Glucose, Bld: 83 mg/dL (ref 70–99)
Potassium: 3.9 mmol/L (ref 3.5–5.1)
Sodium: 136 mmol/L (ref 135–145)
Total Bilirubin: 1.1 mg/dL (ref 0.3–1.2)
Total Protein: 5.2 g/dL — ABNORMAL LOW (ref 6.5–8.1)

## 2020-09-25 LAB — AMYLASE, PLEURAL OR PERITONEAL FLUID: Amylase, Fluid: 15 U/L

## 2020-09-25 LAB — LACTATE DEHYDROGENASE, PLEURAL OR PERITONEAL FLUID: LD, Fluid: 83 U/L — ABNORMAL HIGH (ref 3–23)

## 2020-09-25 LAB — ALBUMIN, PLEURAL OR PERITONEAL FLUID: Albumin, Fluid: 1.2 g/dL

## 2020-09-25 LAB — GLUCOSE, PLEURAL OR PERITONEAL FLUID: Glucose, Fluid: 108 mg/dL

## 2020-09-25 LAB — PHOSPHORUS: Phosphorus: 2.8 mg/dL (ref 2.5–4.6)

## 2020-09-25 LAB — MAGNESIUM: Magnesium: 1.7 mg/dL (ref 1.7–2.4)

## 2020-09-25 MED ORDER — MAGNESIUM SULFATE 2 GM/50ML IV SOLN
2.0000 g | Freq: Once | INTRAVENOUS | Status: AC
  Administered 2020-09-25: 2 g via INTRAVENOUS
  Filled 2020-09-25: qty 50

## 2020-09-25 MED ORDER — FLUCONAZOLE 100 MG PO TABS
200.0000 mg | ORAL_TABLET | Freq: Every day | ORAL | Status: DC
  Administered 2020-09-26: 200 mg via ORAL
  Filled 2020-09-25: qty 2

## 2020-09-25 MED ORDER — LIDOCAINE HCL 1 % IJ SOLN
INTRAMUSCULAR | Status: AC
  Filled 2020-09-25: qty 20

## 2020-09-25 MED ORDER — FUROSEMIDE 10 MG/ML IJ SOLN
40.0000 mg | Freq: Once | INTRAMUSCULAR | Status: AC
  Administered 2020-09-25: 40 mg via INTRAVENOUS
  Filled 2020-09-25: qty 4

## 2020-09-25 MED ORDER — MELATONIN 3 MG PO TABS
6.0000 mg | ORAL_TABLET | Freq: Every evening | ORAL | Status: DC | PRN
  Administered 2020-09-25 – 2020-09-30 (×6): 6 mg via ORAL
  Filled 2020-09-25 (×7): qty 2

## 2020-09-25 MED ORDER — PANTOPRAZOLE SODIUM 40 MG PO TBEC
40.0000 mg | DELAYED_RELEASE_TABLET | Freq: Every day | ORAL | Status: DC
  Administered 2020-09-25 – 2020-09-26 (×2): 40 mg via ORAL
  Filled 2020-09-25 (×2): qty 1

## 2020-09-25 NOTE — Plan of Care (Signed)
  Problem: Education: Goal: Knowledge of General Education information will improve Description Including pain rating scale, medication(s)/side effects and non-pharmacologic comfort measures Outcome: Progressing   

## 2020-09-25 NOTE — Progress Notes (Signed)
Progress Note     Subjective: Feeling well this morning, denies abdominal pain  Objective: Vital signs in last 24 hours: Temp:  [97.4 F (36.3 C)-98.3 F (36.8 C)] 98.1 F (36.7 C) (11/18 0505) Pulse Rate:  [76-82] 81 (11/18 0505) Resp:  [16-18] 18 (11/18 0505) BP: (106-120)/(60-93) 109/69 (11/18 0505) SpO2:  [95 %-97 %] 96 % (11/18 0505) Last BM Date: 09/24/20  Intake/Output from previous day: 11/17 0701 - 11/18 0700 In: 2391.3 [P.O.:1115; I.V.:1176.3; IV Piggyback:100] Out: 615 [Urine:600; Drains:15] Intake/Output this shift: No intake/output data recorded.  PE: Abd: soft, non-tender on palpation, +BS, ND, incisions c/d/i.  Drain in place with SS fluid.     Lab Results:  Recent Labs    09/24/20 0426 09/25/20 0420  WBC 10.0 9.2  HGB 11.3* 10.0*  HCT 36.2* 33.3*  PLT 325 313   BMET Recent Labs    09/24/20 0426 09/25/20 0420  NA 135 136  K 3.7 3.9  CL 103 102  CO2 25 26  GLUCOSE 88 83  BUN 6* 5*  CREATININE 0.87 1.02  CALCIUM 8.0* 7.9*   PT/INR No results for input(s): LABPROT, INR in the last 72 hours. CMP     Component Value Date/Time   NA 136 09/25/2020 0420   NA 140 08/12/2020 0926   K 3.9 09/25/2020 0420   CL 102 09/25/2020 0420   CO2 26 09/25/2020 0420   GLUCOSE 83 09/25/2020 0420   BUN 5 (L) 09/25/2020 0420   BUN 18 08/12/2020 0926   CREATININE 1.02 09/25/2020 0420   CALCIUM 7.9 (L) 09/25/2020 0420   PROT 5.2 (L) 09/25/2020 0420   PROT 6.5 12/13/2019 0936   ALBUMIN 2.1 (L) 09/25/2020 0420   ALBUMIN 4.1 12/13/2019 0936   AST 18 09/25/2020 0420   ALT 16 09/25/2020 0420   ALKPHOS 150 (H) 09/25/2020 0420   BILITOT 1.1 09/25/2020 0420   BILITOT 1.8 (H) 12/13/2019 0936   GFRNONAA >60 09/25/2020 0420   GFRAA 97 08/12/2020 0926   Lipase     Component Value Date/Time   LIPASE 44 09/23/2020 1800       Studies/Results: CT ABDOMEN PELVIS W CONTRAST  Result Date: 09/23/2020 CLINICAL DATA:  Status post cholecystectomy with  weakness. Anorexia. Nausea. Recent fall. EXAM: CT ABDOMEN AND PELVIS WITH CONTRAST TECHNIQUE: Multidetector CT imaging of the abdomen and pelvis was performed using the standard protocol following bolus administration of intravenous contrast. CONTRAST:  OMNIPAQUE IOHEXOL 300 MG/ML  SOLN COMPARISON:  09/15/2020 MRCP. 09/17/2020 ERCP. Abdominopelvic CT 09/14/2020. FINDINGS: Lower chest: Mild right base atelectasis with calcified right sided granulomas. Left lower lobe collapse with increase in moderate left and small right pleural effusions. Mild cardiomegaly with prior median sternotomy and native coronary artery atherosclerosis. Hepatobiliary: Minimal pneumobilia. Resolved intrahepatic biliary duct dilatation. Probable focal fat adjacent the falciform ligament. Cholecystectomy. Ill-defined fluid and edema within the operative bed. Example at 4.6 x 2.8 cm on 31/2. More caudally, contiguous somewhat ill-defined fluid anterior to the pylorus measures 3.0 x 2.5 cm on 37/2. Surgical drain terminates in the operative bed. Pancreas: Normal, without mass or ductal dilatation. Spleen: Normal in size, without focal abnormality. Adrenals/Urinary Tract: Normal adrenal glands. A stone at the left ureteropelvic junction measures 6 mm on 41/2, in the left renal pelvis on the prior. Development of right-sided mild hydroureteronephrosis with decreased excretion of contrast from the right kidney. A mid right ureteric 3 mm stone is most apparent on coronal image 72. The right-side of the bladder  enters an inguinal hernia, similar. Stomach/Bowel: Normal stomach, without wall thickening. Normal colon, appendix, and terminal ileum. Normal abdominal small bowel. Vascular/Lymphatic: Aortic atherosclerosis. No abdominopelvic adenopathy. Reproductive: Normal prostate. Other: Right inguinal hernia containing fat and a portion of the bladder, similar. No significant free fluid. No free intraperitoneal air. Minimal air within the anterior  subcutaneous abdominal wall is likely postoperative. Musculoskeletal: Subtle left femoral head avascular necrosis. IMPRESSION: 1. Status post cholecystectomy with ill-defined fluid and edema in the operative bed. Considerations include phlegmon/developing abscess, biloma, and postoperative hematoma. 2. Development of right-sided urinary tract obstruction secondary to a punctate mid right ureteric stone. 3. Left-sided stone at the ureteropelvic junction, without significant hydronephrosis. 4. Increase in left larger than right pleural effusions with progressive left base atelectasis. 5. Fat and right-side of the bladder within a inguinal hernia, as before. 6.  Aortic Atherosclerosis (ICD10-I70.0). Electronically Signed   By: Jeronimo Greaves M.D.   On: 09/23/2020 19:22   DG CHEST PORT 1 VIEW  Result Date: 09/25/2020 CLINICAL DATA:  Left pleural effusion. Short of breath over the last several days. Increased weakness. EXAM: PORTABLE CHEST 1 VIEW COMPARISON:  09/23/2020 and older exams. FINDINGS: Moderate to large left pleural effusion appears mildly increased from the recent prior study. No visualized right pleural effusion.  No pneumothorax. Presumed atelectasis associated with the left pleural effusion. Lungs otherwise clear. Stable changes from prior cardiac surgery. IMPRESSION: 1. Mild interval increase in the size of the left pleural effusion compared to the study dated 09/23/2020. Pleural effusion is moderate to large. 2. No convincing pneumonia and no evidence of pulmonary edema. No other change. Electronically Signed   By: Amie Portland M.D.   On: 09/25/2020 09:54   DG Chest Port 1 View  Result Date: 09/23/2020 CLINICAL DATA:  Pleural effusions, post gallbladder surgery 09/22/2020. Increasing weakness and mechanical fall EXAM: PORTABLE CHEST 1 VIEW COMPARISON:  Radiograph 09/14/2020 FINDINGS: Enlarging left pleural effusion with adjacent opacity likely reflecting passive atelectasis though underlying  infection or edema is not fully excluded. There is some gradient opacity in the right lung as well which could reflect atelectasis, edema or early airspace disease. Trace peripheral right pleural thickening may reflect trace effusion as well. No pneumothorax. Postsurgical changes related to prior CABG including intact and aligned sternotomy wires and multiple surgical clips projecting over the mediastinum. Atrial occluder device is noted. The aorta is calcified. The remaining cardiomediastinal contours are unremarkable. Telemetry leads overlie the chest. No acute osseous or soft tissue abnormality. IMPRESSION: 1. Enlarging left pleural effusion with adjacent opacity likely reflecting passive atelectasis though underlying infection or edema is not fully excluded. 2. Vascular congestion and gradient opacity in the right lung which is favored to be a combination of atelectasis and edema. Trace right effusion as well. 3.  Aortic Atherosclerosis (ICD10-I70.0). Electronically Signed   By: Kreg Shropshire M.D.   On: 09/23/2020 20:13    Anti-infectives: Anti-infectives (From admission, onward)   Start     Dose/Rate Route Frequency Ordered Stop   09/24/20 0800  piperacillin-tazobactam (ZOSYN) IVPB 3.375 g        3.375 g 12.5 mL/hr over 240 Minutes Intravenous Every 8 hours 09/24/20 0030     09/24/20 0030  piperacillin-tazobactam (ZOSYN) IVPB 3.375 g        3.375 g 100 mL/hr over 30 Minutes Intravenous  Once 09/24/20 0006 09/24/20 0138   09/24/20 0015  fluconazole (DIFLUCAN) tablet 150 mg       Note to Pharmacy: Budding yeast  urine with some urinary symptoms who will likely need to undergo cystoscopy.   150 mg Oral Daily 09/24/20 0010         Assessment/Plan CAD- s/pCABGx 49/21/2021byDr. Z Atkins.  PAF-on Xarelto Hypertension Diarrhea- resolved since surgery  Enlarging L pleural effusion Left-sided kidney stone at the ureteropelvic junction - no significant hydronephrosis but could be  contributing to symptoms   Infarcted gallbladder with cholelithiasis Choledocholithiasis S/p ERCP/sphincterotomy/balloon extraction biliary stones 09/17/2020 Dr. Claudette Head POD 7, S/p laparoscopic partial cholecystectomy/drain placement 09/18/2020 Dr. Ovidio Kin -Continue drain to bulb suction.  LFTs unremarkable and drain output low-volume/serous, currently no suspicion of bile leak.  We will continue to follow peripherally.                  FEN: soft diet ID: was discharged on PO augmentin; PO fluconazole 11/17>>, Zosyn 11/17>> DVT: xarelto Follow-up: [facility]  LOS: 1 day    Berna Bue MD [facility] 09/25/2020, 10:48 AM Please see Amion for pager number during day hours 7:00am-4:30pm

## 2020-09-25 NOTE — Progress Notes (Signed)
PT Cancellation Note  Patient Details Name: [name] Rice MRN: [id number] DOB: 05/16/1951   Cancelled Treatment:    Reason Eval/Treat Not Completed: Fatigue/lethargy limiting ability to participate Pt s/p thoracentesis earlier and requesting to rest at this time.  Will check back as schedule permits.   Addysen Louth,KATHrine E 09/25/2020, 2:42 PM Kati PT, DPT Acute Rehabilitation Services Pager: [phone] Office: [phone]

## 2020-09-25 NOTE — Plan of Care (Signed)

## 2020-09-25 NOTE — TOC Progression Note (Signed)
Transition of Care [facility]) - Progression Note    Patient Details  Name: [name] Rice MRN: [id number] Date of Birth: 1951/02/07  Transition of Care [facility] CM/SW Contact  Geni Bers, RN Phone Number: 09/25/2020, 12:07 PM  Clinical Narrative:    Spoke with pt concerning HH. Pt selected [facility] for HH. Referral was given to in house rep.         Expected Discharge Plan and Services                                                 Social Determinants of Health (SDOH) Interventions    Readmission Risk Interventions No flowsheet data found.

## 2020-09-25 NOTE — Progress Notes (Addendum)
PROGRESS NOTE    [name] Rice  WGN:[id number] DOB: 1951/11/03 DOA: 09/23/2020 PCP: [name], PA-C   Brief Narrative:  HPI per [name] on 09/23/20 [name] Rice is a 69 y.o. male with medical history significant of chronic atrial fibrillation, CAD, s/p CABG almost 2 months ago, LBBB, chronic systolic heart failure of 45 to 50% per recent echocardiogram who was recently admitted and discharged from 09/14/2020 to 09/22/2020 for acute cholecystitis requiring ERCP with successful extraction of a stone, followed by partial laparoscopic cholecystectomy with JP drainage placement who returns to the hospital due to generalized weakness associated with decreased oral intake, abdominal pain, nausea, 2 episodes of emesis and a history of a mechanical fall at home without any significant injury. He states his diarrhea has gotten better in the past few days. He denies fever, but complains of chills and fatigue. He denies rhinorrhea, sore throat, wheezing or hemoptysis. No chest pain, palpitations, diaphoresis, PND or orthopnea. He gets occasional lower extremity edema, but states that this has improved significantly. He denies constipation, melena or hematochezia. He has had mild dysuria along with right flank pain, but no frequency, oliguria or hematuria. No polyuria, polydipsia, polyphagia or blurred vision.  ED Course: Initial vital signs were temperature 97.8 F, pulse 86, respirations 16, blood pressure 143/99 mmHg O2 sat 98% on room air.  Labs: Coronavirus and influenza PCR was negative.  Urine analysis showed large hemoglobinuria with more than 50 RBC microscopic examination.  Ketonuria 5 and proteinuria 30 mg/dL.  There was trace leukocyte esterase with 21-50 WBCs per hpf.  There were a few bacteria, mucus, but engaged and hyaline casts.  Imaging: 1 view chest radiograph shows enlarging left pleural effusion likely reflecting passive atelectasis though underlying infection or edema  could not be fully excluded.  There is vascular congestion and grading opacity in the right lung which is favored to be a combination of atelectasis and edema.  There is trace right pleural effusion as well.  CT abdomen/pelvis with contrast shows status post cholecystectomy with ill-defined fluid and edema in the operative bed.  Considerations include phlegmon, developing abscess, biloma and postoperative hematoma.  Development of right-sided urinary tract obstruction secondary to a punctate mid right ureteric stone treated close to left-sided stone at the ureteropelvic junction without significant hydronephrosis.  There is atelectasis.  There is fat and right side of the bladder within an inguinal hernia.  Please see images and full radiology report.  **Interim History  His diet was advanced to clear liquid diet and will go to soft diet.  Dietitian was consulted for further evaluation we will add probiotics.  Patient states that he feels generally weak but he is improving.  Further work-up done and he had a thoracentesis today which yielded 1.85 L and he was given a dose of IV Lasix for diuresis.  General surgery does not feel he has a bile leak and they are recommending continuing drain output.  PT OT to reevaluate today and urology evaluation still pending but Dr. [name] recommended starting the patient on tamsulosin and having follow-up in outpatient setting as he did not feel like any surgical intervention is warranted for his ureteral stone.  Assessment & Plan:   Principal Problem:   Nausea and vomiting Active Problems:   Atrial fibrillation (HCC)   Pure hypercholesterolemia   Malnutrition of moderate degree (HCC)   Acute blood loss anemia   CAD, multiple vessel   Benign hypertension   Right sided hydronephrosis with  renal and ureteral calculus obstruction   Budding yeast in U/A detected   Generalized weakness  Mild Right sided hydronephrosis with renal and ureteral calculus obstruction  and ? UTI poA -Continue IV hydration and reduce the Rate to 75 mL/hr; Has development of Right Sideded Urinary Tract Obstruction due to Punctate Mid Ureteric Stone -Tamsulosin 0.4 mg p.o. twice daily and will change to Daily  -Discussed with Urology Dr. Gwynneth Macleodahlstedht and he will evaluate eventually  -Leukocytosis on admission of 11.6 which is now improved to 9.2 today but could be in the setting of dehydration -patient's urinalysis showed a hazy appearance with large hemoglobin, 5 ketones, trace leukocytes, negative nitrites, few bacteria, greater than 50 RBCs per high-power field, and 21-50 WBCs; urine culture still pending but was obtained after he is placed on antibiotics -Patient is also started on p.o. fluconazole and will continue to increase the dose -C/w with Empiric Abx with IV Zosyn for now as it will also cover him for his gallbladder issues and can be changed to p.o. Augmentin at discharge  Generalized weakness and poor p.o. intake -Was In the setting of dehydration and poor p.o. intake and recent gallbladder issues and possible UTI -Continuedf IV fluid hydration and now stopped given his worsened left-sided pleural effusion; was given a dose of IV Lasix 40 mg x 1 and had a thoracentesis ordered -PT OT to further evaluate and treat and recommending Home Health -Recommend OOB to Chair  Budding yeast in U/A detected -Will treat given symptoms and possible need for urological procedure; urology feels that no surgical intervention is warranted at this time but they will come evaluate the patient later on -Fluconazole 150 mg p.o. daily changed to 200 p.o. daily  Fluid and Edema in the Operative Bed -? Phlegmon/Developing Abscess, Biloma, Postoperative Hematoma -Will discuss with General Surgery and they do not feel that he has an abscess and feel that the findings were a posterior wall of his gallbladder -They recommend no Follow up   Atrial fibrillation, chronic (HCC) -CHA?DS?-VASc  Score of at least 4. -S/p Maze Procedure on 07/29/20 -Continue metoprolol 12.5 mg p.o. daily. -Continue Xarelto for anticoagulation. -Continue to monitor on telemetry  Acquired Thrombophilia -Think of his atrial fibrillation -Patient's CHA2DS2-VASc is 4 -Continue with anticoagulation with Xarelto  Pure Hypercholesterolemia -Hold Rosuvastatin while on fluconazole.  Malnutrition of moderate degree (HCC) -Dietitian was consulted for further evaluation recommendations -They are recommending Ensure Enlive p.o. twice daily -Continue with multivitamin with minerals daily -Diet has been advanced from a clear liquid diet to soft -We will add a probiotic as well  Normocytic Anemia -In the setting of 2 recent surgeries. -Hemoglobin higher than baseline and is slowly trending down as hemoglobin/hematocrit went from 12.0/39.2 -> 11.3/36.2 -> 10.0/33.2. -Continue monitor hemoglobin/hematocrit and monitor for signs and symptoms of bleeding as he is anticoagulated; currently no overt bleeding noted -Repeat CBC in a.m.  CAD, multiple vessel -Continue beta-blocker and Xarelto. -Holding a statin while using fluconazole. -Resume Statin   Benign Hypertension -He has been holding amlodipine and olmesartan. -Continue metoprolol 12.5 mg p.o. daily. -Monitor blood pressure and heart rate. -Blood pressures on the lower side at 106/60 -Continue monitor blood pressure per protocol  Left-Sided Pleural Effusion, poA and improved -She was given a dose of IV Lasix as he is +2.3 L since admission -Ordered a thoracentesis to be done and he had a left-sided thoracentesis which yielded 1.85 L of fluid -Ordered a Fluid analysis on the pleural fluid and showed a  red color with 1109 total nucleated cell count, 50 lymphs, turbid appearance, 43 monocytes, with Gram stain WBCs and no organisms seen currently -Repeat chest x-ray showed "No pneumothorax following left thoracentesis. Small to moderate-sized left  pleural effusion with an interval significant decrease in amount. Mild left basilar atelectasis, improved." -Will add Flutter Valve and Incentive Spirometry    Obesity -Complicates overall prognosis and Care -Estimated body mass index is 31.05 kg/m as calculated from the following:   Height as of this encounter:  (1.88 m).   Weight as of this encounter: 109.7 kg. -Weight Loss and Dietary Counseling given   DVT prophylaxis: Anticoagulated with Xarelto Code Status: FULL CODE  Family Communication: No family present at bedside Disposition Plan: Pending further clinical improvement and tolerance of p.o. diet and evaluation by PT and OT  Status is: Inpatient  Remains inpatient appropriate because:Unsafe d/c plan, IV treatments appropriate due to intensity of illness or inability to take PO and Inpatient level of care appropriate due to severity of illness   Dispo: The patient is from: Home              Anticipated d/c is to: Home              Anticipated d/c date is: 1 day              Patient currently is not medically stable to d/c.  Consultants:   Urology  General Surgery   IR for Thoracentesis   Procedures: Successful image-guided left thoracentesis. Yielded 1.85 liters of dark red fluid. Procedure was stopped after 1.85 L secondary to patient's BP (78/40, repeat 85/49)- at that time, patient stated he was lightheaded with blurred vision. He was placed in RT with improvement of BP (92/51, repeat 106/56) and resolution of symptoms. Patient tolerated procedure well. No immediate complications. EBL < 1 mL.  Antimicrobials:  Anti-infectives (From admission, onward)   Start     Dose/Rate Route Frequency Ordered Stop   09/26/20 1000  fluconazole (DIFLUCAN) tablet 200 mg        200 mg Oral Daily 09/25/20 1355     09/24/20 0800  piperacillin-tazobactam (ZOSYN) IVPB 3.375 g        3.375 g 12.5 mL/hr over 240 Minutes Intravenous Every 8 hours 09/24/20 0030     09/24/20 0030   piperacillin-tazobactam (ZOSYN) IVPB 3.375 g        3.375 g 100 mL/hr over 30 Minutes Intravenous  Once 09/24/20 0006 09/24/20 0138   09/24/20 0015  fluconazole (DIFLUCAN) tablet 150 mg  Status:  Discontinued       Note to Pharmacy: Budding yeast urine with some urinary symptoms who will likely need to undergo cystoscopy.   150 mg Oral Daily 09/24/20 0010 09/25/20 1355        Subjective: Seen and examined at bedside and he states that he is doing a little bit better and he is eating all bit more.  Diarrhea has improved.  Ambulated with physical therapy yesterday.  No chest pain, lightheadedness or dizziness.  Still feels a little weak.  No other concerns up at this time  Objective: Vitals:   09/25/20 1121 09/25/20 1124 09/25/20 1215 09/25/20 1305  BP: 105/66 (!) 97/58 120/69 117/66  Pulse:   80 78  Resp:   18 (!) 22  Temp:   97.8 F (36.6 C) 97.6 F (36.4 C)  TempSrc:   Oral Oral  SpO2: 100% 99% 100% 98%  Weight:  Height:        Intake/Output Summary (Last 24 hours) at 09/25/2020 1452 Last data filed at 09/25/2020 1258 Gross per 24 hour  Intake 1226.25 ml  Output 1355 ml  Net -128.75 ml   Filed Weights   09/24/20 0130  Weight: 109.7 kg   Examination: Physical Exam:  Constitutional: WN/WD obese Caucasian male in NAD and appears calm and comfortable Eyes: Lids and conjunctivae normal, sclerae anicteric  ENMT: External Ears, Nose appear normal. Grossly normal hearing. Neck: Appears normal, supple, no cervical masses, normal ROM, no appreciable thyromegaly; no JVD Respiratory: Diminishedto auscultation bilaterally worse on the Left compared to the right with some crackles but no appreciable wheezing, rales, rhonchi. Normal respiratory effort and patient is not tachypenic. No accessory muscle use. Unlabored breathing  Cardiovascular: RRR, no murmurs / rubs / gallops. S1 and S2 auscultated. 1+ LE extremity edema.  Abdomen: Soft, non-tender, Distended. No masses palpated.  Bowel sounds positive.  GU: Deferred. Musculoskeletal: No clubbing / cyanosis of digits/nails. No joint deformity upper and lower extremities.  Skin: No rashes, lesions, ulcers on a limited skin evaluation but has a midline scar from his CABG. No induration; Warm and dry.  Neurologic: CN 2-12 grossly intact with no focal deficits. Romberg sign and cerebellar reflexes not assessed.  Psychiatric: Normal judgment and insight. Alert and oriented x 3. Normal mood and appropriate affect.   Data Reviewed: I have personally reviewed following labs and imaging studies  CBC: Recent Labs  Lab 09/21/20 0532 09/22/20 0434 09/23/20 1800 09/24/20 0426 09/25/20 0420  WBC 11.9* 10.1 11.6* 10.0 9.2  NEUTROABS  --   --  8.9*  --  5.2  HGB 10.3* 10.7* 12.0* 11.3* 10.0*  HCT 34.8* 34.9* 39.2 36.2* 33.3*  MCV 96.1 92.1 92.5 91.6 94.1  PLT 295 309 385 325 313   Basic Metabolic Panel: Recent Labs  Lab 09/21/20 0532 09/22/20 0434 09/23/20 1800 09/24/20 0426 09/25/20 0420  NA 135 137 136 135 136  K 3.2* 3.1* 3.6 3.7 3.9  CL 104 105 101 103 102  CO2 23 25 26 25 26   GLUCOSE 99 99 138* 88 83  BUN 9 7* 6* 6* 5*  CREATININE 0.99 0.81 0.98 0.87 1.02  CALCIUM 7.9* 8.1* 8.6* 8.0* 7.9*  MG  --   --   --   --  1.7  PHOS  --   --   --   --  2.8   GFR: Estimated Creatinine Clearance: 92.6 mL/min (by C-G formula based on SCr of 1.02 mg/dL). Liver Function Tests: Recent Labs  Lab 09/21/20 0532 09/22/20 0434 09/23/20 1800 09/24/20 0426 09/25/20 0420  AST 16 14* 21 18 18   ALT 24 21 20 18 16   ALKPHOS 259* 223* 236* 184* 150*  BILITOT 0.9 1.0 1.2 1.0 1.1  PROT 5.5* 5.4* 6.5 5.6* 5.2*  ALBUMIN 2.1* 2.1* 2.6* 2.1* 2.1*   Recent Labs  Lab 09/23/20 1800  LIPASE 44   No results for input(s): AMMONIA in the last 168 hours. Coagulation Profile: No results for input(s): INR, PROTIME in the last 168 hours. Cardiac Enzymes: No results for input(s): CKTOTAL, CKMB, CKMBINDEX, TROPONINI in the last 168  hours. BNP (last 3 results) No results for input(s): PROBNP in the last 8760 hours. HbA1C: No results for input(s): HGBA1C in the last 72 hours. CBG: No results for input(s): GLUCAP in the last 168 hours. Lipid Profile: No results for input(s): CHOL, HDL, LDLCALC, TRIG, CHOLHDL, LDLDIRECT in the last 72 hours.  Thyroid Function Tests: No results for input(s): TSH, T4TOTAL, FREET4, T3FREE, THYROIDAB in the last 72 hours. Anemia Panel: No results for input(s): VITAMINB12, FOLATE, FERRITIN, TIBC, IRON, RETICCTPCT in the last 72 hours. Sepsis Labs: No results for input(s): PROCALCITON, LATICACIDVEN in the last 168 hours.  Recent Results (from the past 240 hour(s))  Resp Panel by RT PCR (RSV, Flu A&B, Covid) - Nasopharyngeal Swab     Status: None   Collection Time: 09/23/20  8:01 PM   Specimen: Nasopharyngeal Swab  Result Value Ref Range Status   SARS Coronavirus 2 by RT PCR NEGATIVE NEGATIVE Final    Comment: (NOTE) SARS-CoV-2 target nucleic acids are NOT DETECTED.  The SARS-CoV-2 RNA is generally detectable in upper respiratoy specimens during the acute phase of infection. The lowest concentration of SARS-CoV-2 viral copies this assay can detect is 131 copies/mL. A negative result does not preclude SARS-Cov-2 infection and should not be used as the sole basis for treatment or other patient management decisions. A negative result may occur with  improper specimen collection/handling, submission of specimen other than nasopharyngeal swab, presence of viral mutation(s) within the areas targeted by this assay, and inadequate number of viral copies (<131 copies/mL). A negative result must be combined with clinical observations, patient history, and epidemiological information. The expected result is Negative.  Fact Sheet for Patients:  https://www.moore.com/  Fact Sheet for Healthcare Providers:  https://www.young.biz/  This test is no t yet  approved or cleared by the [place] FDA and  has been authorized for detection and/or diagnosis of SARS-CoV-2 by FDA under an Emergency Use Authorization (EUA). This EUA will remain  in effect (meaning this test can be used) for the duration of the COVID-19 declaration under Section 564(b)(1) of the Act, 21 U.S.C. section 360bbb-3(b)(1), unless the authorization is terminated or revoked sooner.     Influenza A by PCR NEGATIVE NEGATIVE Final   Influenza B by PCR NEGATIVE NEGATIVE Final    Comment: (NOTE) The Xpert Xpress SARS-CoV-2/FLU/RSV assay is intended as an aid in  the diagnosis of influenza from Nasopharyngeal swab specimens and  should not be used as a sole basis for treatment. Nasal washings and  aspirates are unacceptable for Xpert Xpress SARS-CoV-2/FLU/RSV  testing.  Fact Sheet for Patients: https://www.moore.com/  Fact Sheet for Healthcare Providers: https://www.young.biz/  This test is not yet approved or cleared by the [place] FDA and  has been authorized for detection and/or diagnosis of SARS-CoV-2 by  FDA under an Emergency Use Authorization (EUA). This EUA will remain  in effect (meaning this test can be used) for the duration of the  Covid-19 declaration under Section 564(b)(1) of the Act, 21  U.S.C. section 360bbb-3(b)(1), unless the authorization is  terminated or revoked.    Respiratory Syncytial Virus by PCR NEGATIVE NEGATIVE Final    Comment: (NOTE) Fact Sheet for Patients: https://www.moore.com/  Fact Sheet for Healthcare Providers: https://www.young.biz/  This test is not yet approved or cleared by the [place] FDA and  has been authorized for detection and/or diagnosis of SARS-CoV-2 by  FDA under an Emergency Use Authorization (EUA). This EUA will remain  in effect (meaning this test can be used) for the duration of the  COVID-19 declaration under Section  564(b)(1) of the Act, 21 U.S.C.  section 360bbb-3(b)(1), unless the authorization is terminated or  revoked. Performed at [facility], [address], [place], [place] [address]   Body fluid culture     Status: None (Preliminary result)  Collection Time: 09/25/20 11:24 AM   Specimen: PATH Cytology Pleural fluid  Result Value Ref Range Status   Specimen Description   Final    PLEURAL LT Performed at [facility], [address], [place], [place] [address]    Special Requests   Final    NONE Performed at [facility], [address] [place], [place] [address]    Gram Stain   Final    FEW WBC PRESENT, PREDOMINANTLY MONONUCLEAR NO ORGANISMS SEEN Performed at [facility], [address] [place], [place] [address]    Culture PENDING  Incomplete   Report Status PENDING  Incomplete     RN Pressure Injury Documentation:     Estimated body mass index is 31.05 kg/m as calculated from the following:   Height as of this encounter: 6\' 2"  (1.88 m).   Weight as of this encounter: 109.7 kg.  Malnutrition Type: Nutrition Problem: Inadequate oral intake Etiology: poor appetite, other (see comment) Malnutrition Characteristics: Signs/Symptoms: per patient/family report Nutrition Interventions: Interventions: Ensure Enlive (each supplement provides 350kcal and 20 grams of protein), MVI  Radiology Studies: CT ABDOMEN PELVIS W CONTRAST  Result Date: 09/23/2020 CLINICAL DATA:  Status post cholecystectomy with weakness. Anorexia. Nausea. Recent fall. EXAM: CT ABDOMEN AND PELVIS WITH CONTRAST TECHNIQUE: Multidetector CT imaging of the abdomen and pelvis was performed using the standard protocol following bolus administration of intravenous contrast. CONTRAST:  09/25/2020 OMNIPAQUE IOHEXOL 300 MG/ML  SOLN COMPARISON:  09/15/2020 MRCP. 09/17/2020 ERCP. Abdominopelvic CT 09/14/2020. FINDINGS: Lower chest: Mild right base atelectasis  with calcified right sided granulomas. Left lower lobe collapse with increase in moderate left and small right pleural effusions. Mild cardiomegaly with prior median sternotomy and native coronary artery atherosclerosis. Hepatobiliary: Minimal pneumobilia. Resolved intrahepatic biliary duct dilatation. Probable focal fat adjacent the falciform ligament. Cholecystectomy. Ill-defined fluid and edema within the operative bed. Example at 4.6 x 2.8 cm on 31/2. More caudally, contiguous somewhat ill-defined fluid anterior to the pylorus measures 3.0 x 2.5 cm on 37/2. Surgical drain terminates in the operative bed. Pancreas: Normal, without mass or ductal dilatation. Spleen: Normal in size, without focal abnormality. Adrenals/Urinary Tract: Normal adrenal glands. A stone at the left ureteropelvic junction measures 6 mm on 41/2, in the left renal pelvis on the prior. Development of right-sided mild hydroureteronephrosis with decreased excretion of contrast from the right kidney. A mid right ureteric 3 mm stone is most apparent on coronal image 72. The right-side of the bladder enters an inguinal hernia, similar. Stomach/Bowel: Normal stomach, without wall thickening. Normal colon, appendix, and terminal ileum. Normal abdominal small bowel. Vascular/Lymphatic: Aortic atherosclerosis. No abdominopelvic adenopathy. Reproductive: Normal prostate. Other: Right inguinal hernia containing fat and a portion of the bladder, similar. No significant free fluid. No free intraperitoneal air. Minimal air within the anterior subcutaneous abdominal wall is likely postoperative. Musculoskeletal: Subtle left femoral head avascular necrosis. IMPRESSION: 1. Status post cholecystectomy with ill-defined fluid and edema in the operative bed. Considerations include phlegmon/developing abscess, biloma, and postoperative hematoma. 2. Development of right-sided urinary tract obstruction secondary to a punctate mid right ureteric stone. 3. Left-sided  stone at the ureteropelvic junction, without significant hydronephrosis. 4. Increase in left larger than right pleural effusions with progressive left base atelectasis. 5. Fat and right-side of the bladder within a inguinal hernia, as before. 6.  Aortic Atherosclerosis (ICD10-I70.0). Electronically Signed   By: 04-30-2000 M.D.   On: 09/23/2020 19:22   DG Chest [facility]  Result Date: 09/25/2020 CLINICAL DATA:  Status post left thoracentesis. EXAM: PORTABLE CHEST 1 VIEW COMPARISON:  Earlier today. FINDINGS: Small to moderate-sized left pleural effusion with an interval significant decrease in amount. No pneumothorax. Mild left basilar atelectasis, improved. Clear right lung. Stable enlarged cardiac silhouette and post CABG changes. Thoracic spine degenerative changes. IMPRESSION: 1. No pneumothorax following left thoracentesis. 2. Small to moderate-sized left pleural effusion with an interval significant decrease in amount. 3. Mild left basilar atelectasis, improved. Electronically Signed   By: Beckie Salts M.D.   On: 09/25/2020 11:50   DG CHEST PORT 1 VIEW  Result Date: 09/25/2020 CLINICAL DATA:  Left pleural effusion. Short of breath over the last several days. Increased weakness. EXAM: PORTABLE CHEST 1 VIEW COMPARISON:  09/23/2020 and older exams. FINDINGS: Moderate to large left pleural effusion appears mildly increased from the recent prior study. No visualized right pleural effusion.  No pneumothorax. Presumed atelectasis associated with the left pleural effusion. Lungs otherwise clear. Stable changes from prior cardiac surgery. IMPRESSION: 1. Mild interval increase in the size of the left pleural effusion compared to the study dated 09/23/2020. Pleural effusion is moderate to large. 2. No convincing pneumonia and no evidence of pulmonary edema. No other change. Electronically Signed   By: Amie Portland M.D.   On: 09/25/2020 09:54   DG Chest Port 1 View  Result Date: 09/23/2020 CLINICAL DATA:   Pleural effusions, post gallbladder surgery 09/22/2020. Increasing weakness and mechanical fall EXAM: PORTABLE CHEST 1 VIEW COMPARISON:  Radiograph 09/14/2020 FINDINGS: Enlarging left pleural effusion with adjacent opacity likely reflecting passive atelectasis though underlying infection or edema is not fully excluded. There is some gradient opacity in the right lung as well which could reflect atelectasis, edema or early airspace disease. Trace peripheral right pleural thickening may reflect trace effusion as well. No pneumothorax. Postsurgical changes related to prior CABG including intact and aligned sternotomy wires and multiple surgical clips projecting over the mediastinum. Atrial occluder device is noted. The aorta is calcified. The remaining cardiomediastinal contours are unremarkable. Telemetry leads overlie the chest. No acute osseous or soft tissue abnormality. IMPRESSION: 1. Enlarging left pleural effusion with adjacent opacity likely reflecting passive atelectasis though underlying infection or edema is not fully excluded. 2. Vascular congestion and gradient opacity in the right lung which is favored to be a combination of atelectasis and edema. Trace right effusion as well. 3.  Aortic Atherosclerosis (ICD10-I70.0). Electronically Signed   By: Kreg Shropshire M.D.   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 Request is made for diagnostic and therapeutic left thoracentesis. EXAM: ULTRASOUND GUIDED DIAGNOSTIC AND THERAPEUTIC LEFT THORACENTESIS MEDICATIONS: 10 mL 1% lidocaine COMPLICATIONS: SIR Level A - No therapy, no consequence. PROCEDURE: An ultrasound guided thoracentesis was thoroughly discussed with the patient and questions answered. The benefits, risks,  alternatives and complications were also discussed. The patient understands and wishes to proceed with the procedure. Written consent was obtained. Ultrasound was performed to localize and mark an adequate pocket of fluid in the left chest. The area was then prepped and draped in the normal sterile fashion. 1% Lidocaine was used for local anesthesia. Under ultrasound guidance a 6 Fr Safe-T-Centesis catheter was introduced. Thoracentesis was performed. The catheter was removed and a dressing applied. FINDINGS: A total of  approximately 1.85 L of dark red fluid was removed. Procedure was stopped after 1.85 L secondary to patient's BP 78/40, repeat 85/49)- at that time, patient stated she was lightheaded with blurred vision. He was placed in RT with improvement of BP (92/51, repeat 106/56) and resolution of symptoms. Samples were sent to the laboratory as requested by the clinical team. IMPRESSION: Successful ultrasound guided left thoracentesis yielding 1.85 L of pleural fluid. Read by: Elwin Mocha, PA-C Electronically Signed   By: Corlis Leak M.D.   On: 09/25/2020 12:04   Scheduled Meds: . feeding supplement  237 mL Oral BID BM  . [START ON 09/26/2020] fluconazole  200 mg Oral Daily  . lidocaine      . metoprolol tartrate  12.5 mg Oral q AM  . multivitamin with minerals  1 tablet Oral Daily  . pantoprazole  40 mg Oral QHS  . rivaroxaban  20 mg Oral Q supper  . saccharomyces boulardii  250 mg Oral BID  . tamsulosin  0.4 mg Oral Daily   Continuous Infusions: . piperacillin-tazobactam (ZOSYN)  IV 3.375 g (09/25/20 1435)    LOS: 1 day    Merlene Laughter, DO [facility] PAGER is on AMION  If 7PM-7AM, please contact night-coverage [email or web address]

## 2020-09-25 NOTE — Progress Notes (Signed)
OT Cancellation Note  Patient Details Name: ORENTHAL DEBSKI MRN: [id number] DOB: May 26, 1951   Cancelled Treatment:     Pt. Went to a procedure in the AM.  When patient returned he  Asked to hold OT evaluation until later in the day.    Sheryl Saintil 09/25/2020, 1:46 PM

## 2020-09-25 NOTE — Procedures (Signed)
PROCEDURE SUMMARY:  Successful image-guided left thoracentesis. Yielded 1.85 liters of dark red fluid. Procedure was stopped after 1.85 L secondary to patient's BP (78/40, repeat 85/49)- at that time, patient stated he was lightheaded with blurred vision. He was placed in RT with improvement of BP (92/51, repeat 106/56) and resolution of symptoms. Patient tolerated procedure well. No immediate complications. EBL < 1 mL.  Specimen was sent for labs. CXR ordered.  Please see imaging section of Epic for full dictation.   Gordy Councilman Ladelle Teodoro PA-C 09/25/2020 11:21 AM

## 2020-09-26 ENCOUNTER — Inpatient Hospital Stay (HOSPITAL_COMMUNITY): Payer: Medicare Other

## 2020-09-26 DIAGNOSIS — E871 Hypo-osmolality and hyponatremia: Secondary | ICD-10-CM

## 2020-09-26 DIAGNOSIS — I5033 Acute on chronic diastolic (congestive) heart failure: Secondary | ICD-10-CM

## 2020-09-26 LAB — CBC WITH DIFFERENTIAL/PLATELET
Abs Immature Granulocytes: 0.06 10*3/uL (ref 0.00–0.07)
Basophils Absolute: 0 10*3/uL (ref 0.0–0.1)
Basophils Relative: 0 %
Eosinophils Absolute: 1.3 10*3/uL — ABNORMAL HIGH (ref 0.0–0.5)
Eosinophils Relative: 13 %
HCT: 35.6 % — ABNORMAL LOW (ref 39.0–52.0)
Hemoglobin: 10.7 g/dL — ABNORMAL LOW (ref 13.0–17.0)
Immature Granulocytes: 1 %
Lymphocytes Relative: 12 %
Lymphs Abs: 1.3 10*3/uL (ref 0.7–4.0)
MCH: 28.1 pg (ref 26.0–34.0)
MCHC: 30.1 g/dL (ref 30.0–36.0)
MCV: 93.4 fL (ref 80.0–100.0)
Monocytes Absolute: 1 10*3/uL (ref 0.1–1.0)
Monocytes Relative: 10 %
Neutro Abs: 6.6 10*3/uL (ref 1.7–7.7)
Neutrophils Relative %: 64 %
Platelets: 292 10*3/uL (ref 150–400)
RBC: 3.81 MIL/uL — ABNORMAL LOW (ref 4.22–5.81)
RDW: 17.3 % — ABNORMAL HIGH (ref 11.5–15.5)
WBC: 10.3 10*3/uL (ref 4.0–10.5)
nRBC: 0 % (ref 0.0–0.2)

## 2020-09-26 LAB — COMPREHENSIVE METABOLIC PANEL
ALT: 16 U/L (ref 0–44)
AST: 20 U/L (ref 15–41)
Albumin: 2 g/dL — ABNORMAL LOW (ref 3.5–5.0)
Alkaline Phosphatase: 153 U/L — ABNORMAL HIGH (ref 38–126)
Anion gap: 6 (ref 5–15)
BUN: <5 mg/dL — ABNORMAL LOW (ref 8–23)
CO2: 27 mmol/L (ref 22–32)
Calcium: 7.9 mg/dL — ABNORMAL LOW (ref 8.9–10.3)
Chloride: 100 mmol/L (ref 98–111)
Creatinine, Ser: 0.95 mg/dL (ref 0.61–1.24)
GFR, Estimated: >60 mL/min (ref >60)
Glucose, Bld: 89 mg/dL (ref 70–99)
Potassium: 3.5 mmol/L (ref 3.5–5.1)
Sodium: 133 mmol/L — ABNORMAL LOW (ref 135–145)
Total Bilirubin: 1 mg/dL (ref 0.3–1.2)
Total Protein: 5.1 g/dL — ABNORMAL LOW (ref 6.5–8.1)

## 2020-09-26 LAB — PH, BODY FLUID: pH, Body Fluid: 7.5

## 2020-09-26 LAB — MAGNESIUM: Magnesium: 1.9 mg/dL (ref 1.7–2.4)

## 2020-09-26 LAB — CYTOLOGY - NON PAP

## 2020-09-26 LAB — PHOSPHORUS: Phosphorus: 2.8 mg/dL (ref 2.5–4.6)

## 2020-09-26 MED ORDER — FUROSEMIDE 10 MG/ML IJ SOLN
40.0000 mg | Freq: Once | INTRAMUSCULAR | Status: AC
  Administered 2020-09-26: 40 mg via INTRAVENOUS
  Filled 2020-09-26: qty 4

## 2020-09-26 MED ORDER — TORSEMIDE 10 MG PO TABS
5.0000 mg | ORAL_TABLET | Freq: Every morning | ORAL | Status: DC
  Filled 2020-09-26: qty 0.5

## 2020-09-26 MED ORDER — METOPROLOL SUCCINATE ER 25 MG PO TB24
12.5000 mg | ORAL_TABLET | Freq: Every morning | ORAL | Status: DC
  Administered 2020-09-27: 12.5 mg via ORAL
  Filled 2020-09-26 (×2): qty 1

## 2020-09-26 MED ORDER — BUMETANIDE 0.25 MG/ML IJ SOLN
1.0000 mg | Freq: Two times a day (BID) | INTRAMUSCULAR | Status: DC
  Administered 2020-09-26 – 2020-09-27 (×3): 1 mg via INTRAVENOUS
  Filled 2020-09-26 (×3): qty 10

## 2020-09-26 MED ORDER — POTASSIUM CHLORIDE 20 MEQ PO PACK
20.0000 meq | PACK | Freq: Every day | ORAL | Status: DC
  Administered 2020-09-26 – 2020-10-01 (×6): 20 meq via ORAL
  Filled 2020-09-26 (×6): qty 1

## 2020-09-26 MED ORDER — ASPIRIN 81 MG PO CHEW
81.0000 mg | CHEWABLE_TABLET | Freq: Once | ORAL | Status: AC
  Administered 2020-09-27: 81 mg via ORAL
  Filled 2020-09-26: qty 1

## 2020-09-26 MED ORDER — MAGNESIUM OXIDE 400 (241.3 MG) MG PO TABS
200.0000 mg | ORAL_TABLET | Freq: Two times a day (BID) | ORAL | Status: DC
  Administered 2020-09-26 – 2020-10-01 (×10): 200 mg via ORAL
  Filled 2020-09-26 (×10): qty 1

## 2020-09-26 MED ORDER — DIPHENHYDRAMINE-ZINC ACETATE 2-0.1 % EX CREA
TOPICAL_CREAM | Freq: Two times a day (BID) | CUTANEOUS | Status: DC | PRN
  Filled 2020-09-26: qty 28

## 2020-09-26 NOTE — Evaluation (Signed)
Occupational Therapy Evaluation Patient Details Name: [name] Rice MRN: [id number] DOB: [birth year] Today's Date: 09/26/2020    History of Present Illness 69 yo male admitted with N/V, weakness, fall at home. 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 Recent d/c 09/22/2020   Clinical Impression   [name] is a 69 year old man familiar to therapist and recently discharged from hospital on 11/15. On today's evaluation he demonstrates decreased activity tolerance and generalized weakness. He is able to perform ADLs and ambulation with supervision for safety. Patient ambulated in room x 2 minutes before needing a rest break. o2 sats 99-100% during evaluation on RA but with some increased work of breathing with ambulation. Patient will benefit from skilled OT services while in hospital to improve strength and endurance in order to return home and manage independently.      Follow Up Recommendations  Home health OT    Equipment Recommendations  None recommended by OT    Recommendations for Other Services       Precautions / Restrictions Precautions Precautions: None Precaution Comments: R JP drain Restrictions Weight Bearing Restrictions: No      Mobility Bed Mobility Overal bed mobility: Modified Independent                  Transfers Overall transfer level: Needs assistance   Transfers: Sit to/from Stand;Stand Pivot Transfers Sit to Stand: Supervision Stand pivot transfers: Supervision       General transfer comment: supervision for in room ambulation without device. Able to perform 2 minute ambulation in room prior to needing to sit down.    Balance Overall balance assessment: No apparent balance deficits (not formally assessed)                                         ADL either performed or assessed with clinical judgement   ADL Overall ADL's : Needs assistance/impaired Eating/Feeding: Independent   Grooming:  Supervision/safety;Standing;Wash/dry hands   Upper Body Bathing: Set up;Sitting   Lower Body Bathing: Set up;Sit to/from stand   Upper Body Dressing : Set up;Sitting;Supervision/safety   Lower Body Dressing: Set up;Supervision/safety;Sit to/from stand   Toilet Transfer: Supervision/safety;BSC;Regular Toilet;Grab bars   Toileting- Architect and Hygiene: Supervision/safety;Sit to/from stand       Functional mobility during ADLs: Supervision/safety General ADL Comments: supervision for ambulation in room.     Vision Patient Visual Report: No change from baseline Vision Assessment?: No apparent visual deficits     Perception     Praxis      Pertinent Vitals/Pain Pain Assessment: No/denies pain     Hand Dominance Right   Extremity/Trunk Assessment Upper Extremity Assessment Upper Extremity Assessment: Overall WFL for tasks assessed   Lower Extremity Assessment Lower Extremity Assessment: Defer to PT evaluation   Cervical / Trunk Assessment Cervical / Trunk Assessment: Normal   Communication Communication Communication: No difficulties   Cognition Arousal/Alertness: Awake/alert Behavior During Therapy: WFL for tasks assessed/performed Overall Cognitive Status: Within Functional Limits for tasks assessed                                     General Comments       Exercises     Shoulder Instructions      Home Living Family/patient expects to be discharged  to:: Private residence Living Arrangements: Alone Available Help at Discharge: Friend(s);Available PRN/intermittently Type of Home: House Home Access: Stairs to enter [employer] of Steps: 2 Entrance Stairs-Rails: None Home Layout: One level     Bathroom Shower/Tub: IT trainer: Standard     Home Equipment: None          Prior Functioning/Environment Level of Independence: Independent        Comments: Prior to heart  surgery independent and ambualted out in communicty. Went to rehab after heart surgery - and was independent. Limited by persistent diarhea that kept him weak and close to a bathroom. Went home on 11/15 and reports medical issues caused him problems again and he couldn't get out of the chair.        OT Problem List: Decreased activity tolerance;Cardiopulmonary status limiting activity      OT Treatment/Interventions: Self-care/ADL training;Therapeutic exercise;Therapeutic activities;Balance training;Patient/family education    OT Goals(Current goals can be found in the care plan section) Acute Rehab OT Goals Patient Stated Goal: to get strong enough too safely manage at home alone OT Goal Formulation: With patient Time For Goal Achievement: 10/10/20 Potential to Achieve Goals: Good  OT Frequency: Min 2X/week   Barriers to D/C:            Co-evaluation              AM-PAC OT "6 Clicks" Daily Activity     Outcome Measure Help from another person eating meals?: None Help from another person taking care of personal grooming?: A Little Help from another person toileting, which includes using toliet, bedpan, or urinal?: A Little Help from another person bathing (including washing, rinsing, drying)?: A Little Help from another person to put on and taking off regular upper body clothing?: A Little Help from another person to put on and taking off regular lower body clothing?: A Little 6 Click Score: 19   End of Session Nurse Communication: Mobility status  Activity Tolerance: Patient tolerated treatment well Patient left: in chair;with call bell/phone within reach  OT Visit Diagnosis: Muscle weakness (generalized) (M62.81)                Time: 2811-8867 OT Time Calculation (min): 23 min Charges:  OT General Charges $OT Visit: 1 Visit OT Evaluation $OT Eval Moderate Complexity: 1 Mod  Raina Sole, OTR/L Acute Care Rehab Services  Office [phone] Pager: [phone]    Kelli Churn 09/26/2020, 1:11 PM

## 2020-09-26 NOTE — Progress Notes (Signed)
Subjective: Patient feels well today.  Breathing better since thora yesterday.    ROS: See above, otherwise other systems negative  Objective: Vital signs in last 24 hours: Temp:  [97.6 F (36.4 C)-98 F (36.7 C)] 98 F (36.7 C) (11/19 0456) Pulse Rate:  [78-89] 89 (11/19 0951) Resp:  [18-22] 18 (11/18 2113) BP: (85-120)/(54-78) 114/76 (11/19 0951) SpO2:  [96 %-100 %] 96 % (11/19 0456) Last BM Date: 09/24/20  Intake/Output from previous day: 11/18 0701 - 11/19 0700 In: 75.2 [IV Piggyback:75.2] Out: 2754 [Urine:2754] Intake/Output this shift: No intake/output data recorded.  PE: Skin: diffuse rash over neck, upper back, and some going down chest Abd: soft, NT, ND, incisions healing well, JP drain with minimal serosang output, like 1-2cc max  Lab Results:  Recent Labs    09/25/20 0420 09/26/20 0543  WBC 9.2 10.3  HGB 10.0* 10.7*  HCT 33.3* 35.6*  PLT 313 292   BMET Recent Labs    09/25/20 0420 09/26/20 0543  NA 136 133*  K 3.9 3.5  CL 102 100  CO2 26 27  GLUCOSE 83 89  BUN 5* <5*  CREATININE 1.02 0.95  CALCIUM 7.9* 7.9*   PT/INR No results for input(s): LABPROT, INR in the last 72 hours. CMP     Component Value Date/Time   NA 133 (L) 09/26/2020 0543   NA 140 08/12/2020 0926   K 3.5 09/26/2020 0543   CL 100 09/26/2020 0543   CO2 27 09/26/2020 0543   GLUCOSE 89 09/26/2020 0543   BUN <5 (L) 09/26/2020 0543   BUN 18 08/12/2020 0926   CREATININE 0.95 09/26/2020 0543   CALCIUM 7.9 (L) 09/26/2020 0543   PROT 5.1 (L) 09/26/2020 0543   PROT 6.5 12/13/2019 0936   ALBUMIN 2.0 (L) 09/26/2020 0543   ALBUMIN 4.1 12/13/2019 0936   AST 20 09/26/2020 0543   ALT 16 09/26/2020 0543   ALKPHOS 153 (H) 09/26/2020 0543   BILITOT 1.0 09/26/2020 0543   BILITOT 1.8 (H) 12/13/2019 0936   GFRNONAA >60 09/26/2020 0543   GFRAA 97 08/12/2020 0926   Lipase     Component Value Date/Time   LIPASE 44 09/23/2020 1800       Studies/Results: DG CHEST PORT 1  VIEW  Result Date: 09/26/2020 CLINICAL DATA:  Shortness of breath. EXAM: PORTABLE CHEST 1 VIEW COMPARISON:  09/25/2020. FINDINGS: Prior CABG. Left atrial appendage clip in stable position. Stable cardiomegaly. No pulmonary venous congestion. Calcified pulmonary nodules again noted consistent prior granulomas disease. Bibasilar interstitial prominence noted on today's exam. Persistent left-sided pleural effusion. Findings suggest CHF. Pneumonitis cannot be excluded. No pneumothorax. IMPRESSION: Prior CABG. Left atrial appendage clip in stable position. Stable cardiomegaly. Bibasilar interstitial prominence noted on today's exam. Persistent left-sided pleural effusion. Findings suggest CHF. Electronically Signed   By: Maisie Fus  Register   On: 09/26/2020 06:52   DG Chest Port 1 View  Result Date: 09/25/2020 CLINICAL DATA:  Status post left thoracentesis. EXAM: PORTABLE CHEST 1 VIEW COMPARISON:  Earlier today. FINDINGS: Small to moderate-sized left pleural effusion with an interval significant decrease in amount. No pneumothorax. Mild left basilar atelectasis, improved. Clear right lung. Stable enlarged cardiac silhouette and post CABG changes. Thoracic spine degenerative changes. IMPRESSION: 1. No pneumothorax following left thoracentesis. 2. Small to moderate-sized left pleural effusion with an interval significant decrease in amount. 3. Mild left basilar atelectasis, improved. Electronically Signed   By: Beckie Salts M.D.   On: 09/25/2020 11:50   DG  CHEST PORT 1 VIEW  Result Date: 09/25/2020 CLINICAL DATA:  Left pleural effusion. Short of breath over the last several days. Increased weakness. EXAM: PORTABLE CHEST 1 VIEW COMPARISON:  09/23/2020 and older exams. FINDINGS: Moderate to large left pleural effusion appears mildly increased from the recent prior study. No visualized right pleural effusion.  No pneumothorax. Presumed atelectasis associated with the left pleural effusion. Lungs otherwise clear.  Stable changes from prior cardiac surgery. IMPRESSION: 1. Mild interval increase in the size of the left pleural effusion compared to the study dated 09/23/2020. Pleural effusion is moderate to large. 2. No convincing pneumonia and no evidence of pulmonary edema. No other change. Electronically Signed   By: Amie Portland M.D.   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 Request is made for diagnostic and therapeutic left thoracentesis. EXAM: ULTRASOUND GUIDED DIAGNOSTIC AND THERAPEUTIC LEFT THORACENTESIS MEDICATIONS: 10 mL 1% lidocaine COMPLICATIONS: SIR Level A - No therapy, no consequence. PROCEDURE: An ultrasound guided thoracentesis was thoroughly discussed with the patient and questions answered. The benefits, risks, alternatives and complications were also discussed. The patient understands and wishes to proceed with the procedure. Written consent was obtained. Ultrasound was performed to localize and mark an adequate pocket of fluid in the left chest. The area was then prepped and draped in the normal sterile fashion. 1% Lidocaine was used for local anesthesia. Under ultrasound guidance a 6 Fr Safe-T-Centesis catheter was introduced. Thoracentesis was performed. The catheter was removed and a dressing applied. FINDINGS: A total of approximately 1.85 L of dark red fluid was removed. Procedure was stopped after 1.85 L secondary to patient's BP 78/40, repeat 85/49)- at that time, patient stated she was lightheaded with blurred vision. He was placed in RT with improvement of BP (92/51, repeat 106/56) and resolution of symptoms. Samples were sent to the laboratory as requested by the clinical team. IMPRESSION: Successful ultrasound guided left thoracentesis yielding 1.85 L of  pleural fluid. Read by: Elwin Mocha, PA-C Electronically Signed   By: Corlis Leak M.D.   On: 09/25/2020 12:04    Anti-infectives: Anti-infectives (From admission, onward)   Start     Dose/Rate Route Frequency Ordered Stop   09/26/20 1000  fluconazole (DIFLUCAN) tablet 200 mg        200 mg Oral Daily 09/25/20 1355     09/24/20 0800  piperacillin-tazobactam (ZOSYN) IVPB 3.375 g        3.375 g 12.5 mL/hr over 240 Minutes Intravenous Every 8 hours 09/24/20 0030     09/24/20 0030  piperacillin-tazobactam (ZOSYN) IVPB 3.375 g        3.375 g 100 mL/hr over 30 Minutes Intravenous  Once 09/24/20 0006 09/24/20 0138   09/24/20 0015  fluconazole (DIFLUCAN) tablet 150 mg  Status:  Discontinued       Note to Pharmacy: Budding yeast urine with some urinary symptoms who will likely need to undergo cystoscopy.   150 mg Oral Daily 09/24/20 0010 09/25/20 1355       Assessment/Plan CAD- s/pCABGx 49/21/2021byDr. Z Atkins.  PAF-on Xarelto Hypertension Diarrhea- resolved since surgery  Enlarging L pleural effusion Left-sided kidney stone at the ureteropelvic junction - no significant hydronephrosis but could be contributing to symptoms  Rash - per medicine  Infarcted gallbladder with cholelithiasis Choledocholithiasis S/p  ERCP/sphincterotomy/balloon extraction biliary stones 09/17/2020 Dr. Claudette Head POD 8,S/p laparoscopic partial cholecystectomy/drain placement 09/18/2020 Dr. Ovidio Kin -Continue drain to bulb suction.  LFTs unremarkable and drain output low-volume/serous  -surgically stable for DC home with drain when medically stable  -no suspicion for bile leak at this time  -follow up with Dr. Ezzard Standing already scheduled  -prn for the weekend.  Discussed with primary service  FEN: soft diet ID: was discharged on PO augmentin; PO fluconazole 11/17>>, Zosyn 11/17>>  Completed 7 days of post abx from gb standpoint.  No further needed. DVT:  xarelto Follow-up:Dr. Ezzard Standing, already made   LOS: 2 days    Letha Cape , [facility] Surgery 09/26/2020, 9:57 AM Please see Amion for pager number during day hours 7:00am-4:30pm or 7:00am -11:30am on weekends

## 2020-09-26 NOTE — Progress Notes (Signed)
Held Lasix per pt refusal due to BP 114/76, MD made aware. SRP, RN

## 2020-09-26 NOTE — Progress Notes (Signed)
SATURATION QUALIFICATIONS: (This note is used to comply with regulatory documentation for home oxygen)  Patient Saturations on Room Air at Rest 100  Patient Saturations on Room Air while Ambulating 96-99%  Patient Saturations on 0 Liters of oxygen while Ambulating N/A  Please briefly explain why patient needs home oxygen: Pt able to maintain sat at 96-99% on RA while ambulating, pt did show mild exertion with activity  SRP, RN

## 2020-09-26 NOTE — Progress Notes (Signed)
Furosemide given as ordered. BP noted. Up in chair want to return to bed for next couple hours due to Lasix given. Bil leg edema noted with rash on lower legs, SCDs applied to legs. Will cont to follow and assess ambulation sat when pt feeling better. SRP, RN

## 2020-09-26 NOTE — Care Management Important Message (Signed)
Important Message  Patient Details IM Letter given to the Patient. Name: [name] Rice MRN: [id number] Date of Birth: [birth year]   Medicare Important Message Given:  Yes     [name] 09/26/2020, 11:26 AM

## 2020-09-26 NOTE — Progress Notes (Signed)
Physical Therapy Treatment Patient Details Name: [name] Rice MRN: [id number] DOB: [birth year] Today's Date: 09/26/2020    History of Present Illness 69 yo male admitted with N/V, weakness, fall at home. 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 Recent d/c 09/22/2020    PT Comments    Pt received awake in bed. Supine/beginning BP 94/59. S/o pitting edema on B LE's. Supervision for all bed mobility. He doesn't require additional time or assistance to complete, no s/o fatigue or c/o pain with mobility. Sitting EOB BP: 110/79. Sit<>stand min G for safety due to low BP. He displays slight difficulty to stand from a low surface, but is able to complete without any physical assistance. Standing BP: 103/66. Pt admits to slight dizziness when first standing, which ceases after standing for ~1 min. Pt ambulates in hallway for 21ft min G for safety and BP recorded at: 91/61. He c/o of slight dizziness. Chair follow utilized to get back to room. Pt performs sit<>stand and standing pivot back to bed without RW and performs well with no assistance needed or LOB. BP sitting EOB: 133/84. BP able to recover quickly. Pt very pleasant and willing to participate throughout session.  Follow Up Recommendations  Home health PT     Equipment Recommendations  None recommended by PT    Recommendations for Other Services       Precautions / Restrictions Precautions Precautions: Fall Precaution Comments: R JP drain Restrictions Weight Bearing Restrictions: No    Mobility  Bed Mobility Overal bed mobility: Needs Assistance Bed Mobility: Supine to Sit     Supine to sit: Supervision     General bed mobility comments: Fluid movements, no need for additional time to complete bed mobility. No s/o fatigue or increas in pain  Transfers Overall transfer level: Needs assistance Equipment used: Rolling walker (2 wheeled);None Transfers: Sit to/from Stand Sit to Stand: Min guard Stand pivot  transfers: Min guard       General transfer comment: min G for safety due to low BP. Performs with proper mechanics and no need for additional time to complete.  Ambulation/Gait Ambulation/Gait assistance: Min guard Gait Distance (Feet): 60 Feet Assistive device: Rolling walker (2 wheeled) Gait Pattern/deviations: Step-through pattern     General Gait Details: Used RW for safety. Mildly unsteady. NO knee buckling observed. BP dropped after ambulating ~109ft. Chair follow utilized for back to room   Stairs             Wheelchair Mobility    Modified Rankin (Stroke Patients Only)       Balance Overall balance assessment: No apparent balance deficits (not formally assessed)                                          Cognition Arousal/Alertness: Awake/alert Behavior During Therapy: WFL for tasks assessed/performed Overall Cognitive Status: Within Functional Limits for tasks assessed                                        Exercises      General Comments        Pertinent Vitals/Pain Pain Assessment: No/denies pain    Home Living Family/patient expects to be discharged to:: Private residence Living Arrangements: Alone Available Help at Discharge: Friend(s);Available PRN/intermittently Type of Home: House  Home Access: Stairs to enter Entrance Stairs-Rails: None Home Layout: One level Home Equipment: None      Prior Function Level of Independence: Independent      Comments: Prior to heart surgery independent and ambualted out in communicty. Went to rehab after heart surgery - and was independent. Limited by persistent diarhea that kept him weak and close to a bathroom. Went home on 11/15 and reports medical issues caused him problems again and he couldn't get out of the chair.   PT Goals (current goals can now be found in the care plan section) Acute Rehab PT Goals Patient Stated Goal: to get strong enough too safely manage at  home alone PT Goal Formulation: With patient Time For Goal Achievement: 10/08/20 Potential to Achieve Goals: Good Progress towards PT goals: Progressing toward goals    Frequency    Min 3X/week      PT Plan Current plan remains appropriate    Co-evaluation              AM-PAC PT "6 Clicks" Mobility   Outcome Measure  Help needed turning from your back to your side while in a flat bed without using bedrails?: None Help needed moving from lying on your back to sitting on the side of a flat bed without using bedrails?: None Help needed moving to and from a bed to a chair (including a wheelchair)?: None Help needed standing up from a chair using your arms (e.g., wheelchair or bedside chair)?: None Help needed to walk in hospital room?: None Help needed climbing 3-5 steps with a railing? : A Little 6 Click Score: 23    End of Session Equipment Utilized During Treatment: Gait belt Activity Tolerance: Patient tolerated treatment well;Patient limited by fatigue Patient left: in chair;with call bell/phone within reach;with bed alarm set Nurse Communication: Mobility status PT Visit Diagnosis: Muscle weakness (generalized) (M62.81);History of falling (Z91.81)     Time: 3903-0092 PT Time Calculation (min) (ACUTE ONLY): 26 min  Charges:  $Gait Training: 8-22 mins $Therapeutic Activity: 8-22 mins                     C. Alinda Dooms, SPTA [facility] [phone]

## 2020-09-26 NOTE — Plan of Care (Signed)
  Problem: Education: Goal: Knowledge of General Education information will improve Description Including pain rating scale, medication(s)/side effects and non-pharmacologic comfort measures Outcome: Progressing   

## 2020-09-26 NOTE — Progress Notes (Signed)
PROGRESS NOTE    [name] Rice  ZDG:[id number] DOB: 1951-05-12 DOA: 09/23/2020 PCP: [name], PA-C   Brief Narrative:  HPI per [name] on 09/23/20 [name] Rice is a 69 y.o. male with medical history significant of chronic atrial fibrillation, CAD, s/p CABG almost 2 months ago, LBBB, chronic systolic heart failure of 45 to 50% per recent echocardiogram who was recently admitted and discharged from 09/14/2020 to 09/22/2020 for acute cholecystitis requiring ERCP with successful extraction of a stone, followed by partial laparoscopic cholecystectomy with JP drainage placement who returns to the hospital due to generalized weakness associated with decreased oral intake, abdominal pain, nausea, 2 episodes of emesis and a history of a mechanical fall at home without any significant injury. He states his diarrhea has gotten better in the past few days. He denies fever, but complains of chills and fatigue. He denies rhinorrhea, sore throat, wheezing or hemoptysis. No chest pain, palpitations, diaphoresis, PND or orthopnea. He gets occasional lower extremity edema, but states that this has improved significantly. He denies constipation, melena or hematochezia. He has had mild dysuria along with right flank pain, but no frequency, oliguria or hematuria. No polyuria, polydipsia, polyphagia or blurred vision.  ED Course: Initial vital signs were temperature 97.8 F, pulse 86, respirations 16, blood pressure 143/99 mmHg O2 sat 98% on room air.  Labs: Coronavirus and influenza PCR was negative.  Urine analysis showed large hemoglobinuria with more than 50 RBC microscopic examination.  Ketonuria 5 and proteinuria 30 mg/dL.  There was trace leukocyte esterase with 21-50 WBCs per hpf.  There were a few bacteria, mucus, but engaged and hyaline casts.  Imaging: 1 view chest radiograph shows enlarging left pleural effusion likely reflecting passive atelectasis though underlying infection or edema  could not be fully excluded.  There is vascular congestion and grading opacity in the right lung which is favored to be a combination of atelectasis and edema.  There is trace right pleural effusion as well.  CT abdomen/pelvis with contrast shows status post cholecystectomy with ill-defined fluid and edema in the operative bed.  Considerations include phlegmon, developing abscess, biloma and postoperative hematoma.  Development of right-sided urinary tract obstruction secondary to a punctate mid right ureteric stone treated close to left-sided stone at the ureteropelvic junction without significant hydronephrosis.  There is atelectasis.  There is fat and right side of the bladder within an inguinal hernia.  Please see images and full radiology report.  **Interim History  His diet was advanced to clear liquid diet and will go to soft diet.  Dietitian was consulted for further evaluation we will add probiotics.  Patient states that he feels generally weak but he is improving.  Further work-up done and he had a thoracentesis today which yielded 1.85 L and he was given a dose of IV Lasix for diuresis.  General surgery does not feel he has a bile leak and they are recommending continuing drain output.  PT OT to reevaluate today and urology evaluation still pending but Dr. [name] recommended starting the patient on tamsulosin and having follow-up in outpatient setting as he did not feel like any surgical intervention is warranted for his ureteral stone.  Patient was given a dose of IV Lasix on 09/25/2020 and will be given another dose today given his volume overload.  Likely has acute on chronic diastolic CHF.  Cardiology is consulted for further evaluation recommendations.  His antibiotics have now stopped and patient has a rash that has developed  we will provide Benadryl.  And monitor off of antibiotics.  We will also stop antifungals given his negative growth and urine culture.  Assessment & Plan:     Principal Problem:   Nausea and vomiting Active Problems:   Atrial fibrillation (HCC)   Pure hypercholesterolemia   Malnutrition of moderate degree (HCC)   Acute blood loss anemia   CAD, multiple vessel   Benign hypertension   Right sided hydronephrosis with renal and ureteral calculus obstruction   Budding yeast in U/A detected   Generalized weakness  Mild Right sided hydronephrosis with renal and ureteral calculus obstruction and ? UTI poA -Continue IV hydration and reduce the Rate to 75 mL/hr; Has development of Right Sideded Urinary Tract Obstruction due to Punctate Mid Ureteric Stone -Tamsulosin 0.4 mg p.o. twice daily and will change to Daily  -Discussed with Urology Dr. Gwynneth Macleod and he will evaluate eventually  -Leukocytosis on admission of 11.6 which is now improved to 9.2 yesterday but could have been in the setting of continued dehydration: WBC is now 10.3 -patient's urinalysis showed a hazy appearance with large hemoglobin, 5 ketones, trace leukocytes, negative nitrites, few bacteria, greater than 50 RBCs per high-power field, and 21-50 WBCs; urine culture still pending but was obtained after he is placed on antibiotics; urine culture showed no growth so we will stop antibiotics -Patient is also started on p.o. fluconazole and will now discontinue -Antibiotics have now been discontinued and general surgery feels that he does not need more antibiotics from a gallbladder standpoint; patient did develop a rash likely in the setting of his antibiotics-we will need to continue to monitor and watch off of antibiotics  Generalized weakness and poor p.o. intake -Was In the setting of dehydration and poor p.o. intake and recent gallbladder issues and possible UTI -Continuedf IV fluid hydration and now stopped given his worsened left-sided pleural effusion; was given a dose of IV Lasix 40 mg x 1 and had a thoracentesis ordered; thoracentesis done yesterday and removal 0.85 L; now can get  another dose of IV Lasix -PT OT to further evaluate and treat and recommending Home Health -Recommend OOB to Chair  Budding yeast in U/A detected -Will treat given symptoms and possible need for urological procedure; urology feels that no surgical intervention is warranted at this time but they will come evaluate the patient later on -Fluconazole 150 mg p.o. daily changed to 200 p.o. daily now.  Fluid and Edema in the Operative Bed -? Phlegmon/Developing Abscess, Biloma, Postoperative Hematoma -Will discuss with General Surgery and they do not feel that he has an abscess and feel that the findings were a posterior wall of his gallbladder -They recommend no Follow up and antibiotics now been stopped  Atrial fibrillation, chronic (HCC) -CHA?DS?-VASc Score of at least 4. -S/p Maze Procedure on 07/29/20 -Continue metoprolol 12.5 mg p.o. daily. -Continue Xarelto for anticoagulation. -Continue to monitor on telemetry  Acquired Thrombophilia -Think of his atrial fibrillation -Patient's CHA2DS2-VASc is 4 -Continue with anticoagulation with Xarelto  Pure Hypercholesterolemia -Hold Rosuvastatin while on fluconazole and resume now that fluconazole is being discontinued  Malnutrition of moderate degree (HCC) -Dietitian was consulted for further evaluation recommendations -They are recommending Ensure Enlive p.o. twice daily -Continue with multivitamin with minerals daily -Diet has been advanced from a clear liquid diet to soft -We will add a probiotic as well  Normocytic Anemia -In the setting of 2 recent surgeries. -Hemoglobin higher than baseline and is slowly trending down as hemoglobin/hematocrit went from 12.0/39.2 ->  11.3/36.2 -> 10.0/33.2 and is now 10.7/35.6 -Continue monitor hemoglobin/hematocrit and monitor for signs and symptoms of bleeding as he is anticoagulated; currently no overt bleeding noted -Repeat CBC in a.m.  CAD, multiple vessel -Continue beta-blocker and  Xarelto. -Holding a statin while using fluconazole. -Resume Statin   Benign Hypertension -He has been holding amlodipine and olmesartan. -Continue metoprolol 12.5 mg p.o. daily. -Monitor blood pressure and heart rate. -Blood pressures on the lower side at 106/60 -Continue monitor blood pressure per protocol  Left-Sided Pleural Effusion, poA and improved -He was given a dose of IV Lasix yesterday and will be given 1 today as he was +2.3 L since admission; Now Patient is +199 mL -Ordered a thoracentesis to be done and he had a left-sided thoracentesis which yielded 1.85 L of fluid -Ordered a Fluid analysis on the pleural fluid and showed a red color with 1109 total nucleated cell count, 50 lymphs, turbid appearance, 43 monocytes, with Gram stain WBCs and no organisms seen currently -Repeat chest x-ray showed "No pneumothorax following left thoracentesis. Small to moderate-sized left pleural effusion with an interval significant decrease in amount. Mild left basilar atelectasis, improved." -Will add Flutter Valve and Incentive Spirometry   Acute on Chronic Diastolic CHF  -Chest x-ray today showed "Prior CABG. Left atrial appendage clip in stable position. Stable cardiomegaly. Bibasilar interstitial prominence noted on today's exam. Persistent left-sided pleural effusion. Findings suggest CHF." -Given a dose of IV diuresis yesterday and will be given another today pending his blood pressure next-strict I's and O's, daily weights -We will fluid restrict to 1500 mL -Cardiology is consulted for further evaluation recommendations -Continue to monitor for signs and symptoms of volume overload and repeat chest x-ray in a.m. -Follow-up on cardiology recommendations and will repeat a BMP  Rash -Diffuse whole body rash could be from contact dermatitis or medication induced -Have stopped antibiotics -Provide the patient with Benadryl cream  Hyponatremia -Likely hypervolemic hyponatremia    -Continue with diuresis as tolerated -Repeat CMP in a.m.   Obesity -Complicates overall prognosis and Care -Estimated body mass index is 31.05 kg/m as calculated from the following:   Height as of this encounter:  (1.88 m).   Weight as of this encounter: 109.7 kg. -Weight Loss and Dietary Counseling given   DVT prophylaxis: Anticoagulated with Xarelto Code Status: FULL CODE  Family Communication: No family present at bedside Disposition Plan: Pending further clinical improvement and tolerance of p.o. diet and evaluation by PT and OT  Status is: Inpatient  Remains inpatient appropriate because:Unsafe d/c plan, IV treatments appropriate due to intensity of illness or inability to take PO and Inpatient level of care appropriate due to severity of illness   Dispo: The patient is from: Home              Anticipated d/c is to: Home              Anticipated d/c date is: 122 days              Patient currently is not medically stable to d/c.  Consultants:   Urology  General Surgery   IR for Thoracentesis   Procedures: Successful image-guided left thoracentesis. Yielded 1.85 liters of dark red fluid. Procedure was stopped after 1.85 L secondary to patient's BP (78/40, repeat 85/49)- at that time, patient stated he was lightheaded with blurred vision. He was placed in RT with improvement of BP (92/51, repeat 106/56) and resolution of symptoms. Patient tolerated procedure well. No immediate  complications. EBL < 1 mL.  Antimicrobials:  Anti-infectives (From admission, onward)   Start     Dose/Rate Route Frequency Ordered Stop   09/26/20 1000  fluconazole (DIFLUCAN) tablet 200 mg        200 mg Oral Daily 09/25/20 1355     09/24/20 0800  piperacillin-tazobactam (ZOSYN) IVPB 3.375 g  Status:  Discontinued        3.375 g 12.5 mL/hr over 240 Minutes Intravenous Every 8 hours 09/24/20 0030 09/26/20 1151   09/24/20 0030  piperacillin-tazobactam (ZOSYN) IVPB 3.375 g        3.375  g 100 mL/hr over 30 Minutes Intravenous  Once 09/24/20 0006 09/24/20 0138   09/24/20 0015  fluconazole (DIFLUCAN) tablet 150 mg  Status:  Discontinued       Note to Pharmacy: Budding yeast urine with some urinary symptoms who will likely need to undergo cystoscopy.   150 mg Oral Daily 09/24/20 0010 09/25/20 1355        Subjective: Seen and examined at bedside and he thinks he still doing a little bit better but still felt a little weak.  Not as short of breath today.  Asking if he is having more fluid drained off today.  No lightheadedness or dizziness.  Legs are swollen and he did not realize.  No other concerns or plans at this time and ambulated without issues and did not desaturate on home ambulatory screen.  Cardiology is consulted for further evaluation recommendations.  Objective: Vitals:   09/26/20 1221 09/26/20 1322 09/26/20 1534 09/26/20 1558  BP: (!) 113/59 112/65  117/66  Pulse: 95 82  85  Resp:  16  18  Temp:  97.9 F (36.6 C)  97.9 F (36.6 C)  TempSrc:  Oral  Oral  SpO2:  100% 95% 98%  Weight:      Height:        Intake/Output Summary (Last 24 hours) at 09/26/2020 1723 Last data filed at 09/26/2020 0900 Gross per 24 hour  Intake 400 ml  Output 1455 ml  Net -1055 ml   Filed Weights   09/24/20 0130  Weight: 109.7 kg   Examination: Physical Exam:  Constitutional: WN/WD obese Caucasian male in NAD and appears calm and comfortable Eyes: Lids and conjunctivae normal, sclerae anicteric  ENMT: External Ears, Nose appear normal. Grossly normal hearing.  Neck: Appears normal, supple, no cervical masses, normal ROM, no appreciable thyromegaly; no JVD Respiratory: Diminished to auscultation bilaterally, no wheezing, rales, rhonchi or crackles. Normal respiratory effort and patient is not tachypenic. No accessory muscle use. Unlabored breathing  Cardiovascular: RRR, no murmurs / rubs / gallops. S1 and S2 auscultated. 1+ LE pitting edema Abdomen: Soft, non-tender,  Distended 2/2 body habitus. Bowel sounds positive.  GU: Deferred. Musculoskeletal: No clubbing / cyanosis of digits/nails. No joint deformity upper and lower extremities.  Skin: A diffuse body rash noted and a midline incision in his sternum from his CABG. No induration; Warm and dry.  Neurologic: CN 2-12 grossly intact with no focal deficits. Romberg sign and cerebellar reflexes not assessed.  Psychiatric: Normal judgment and insight. Alert and oriented x 3. Normal mood and appropriate affect.   Data Reviewed: I have personally reviewed following labs and imaging studies  CBC: Recent Labs  Lab 09/22/20 0434 09/23/20 1800 09/24/20 0426 09/25/20 0420 09/26/20 0543  WBC 10.1 11.6* 10.0 9.2 10.3  NEUTROABS  --  8.9*  --  5.2 6.6  HGB 10.7* 12.0* 11.3* 10.0* 10.7*  HCT 34.9* 39.2  36.2* 33.3* 35.6*  MCV 92.1 92.5 91.6 94.1 93.4  PLT 309 385 325 313 292   Basic Metabolic Panel: Recent Labs  Lab 09/22/20 0434 09/23/20 1800 09/24/20 0426 09/25/20 0420 09/26/20 0543  NA 137 136 135 136 133*  K 3.1* 3.6 3.7 3.9 3.5  CL 105 101 103 102 100  CO2 25 26 25 26 27   GLUCOSE 99 138* 88 83 89  BUN 7* 6* 6* 5* <5*  CREATININE 0.81 0.98 0.87 1.02 0.95  CALCIUM 8.1* 8.6* 8.0* 7.9* 7.9*  MG  --   --   --  1.7 1.9  PHOS  --   --   --  2.8 2.8   GFR: Estimated Creatinine Clearance: 99.5 mL/min (by C-G formula based on SCr of 0.95 mg/dL). Liver Function Tests: Recent Labs  Lab 09/22/20 0434 09/23/20 1800 09/24/20 0426 09/25/20 0420 09/26/20 0543  AST 14* 21 18 18 20   ALT 21 20 18 16 16   ALKPHOS 223* 236* 184* 150* 153*  BILITOT 1.0 1.2 1.0 1.1 1.0  PROT 5.4* 6.5 5.6* 5.2* 5.1*  ALBUMIN 2.1* 2.6* 2.1* 2.1* 2.0*   Recent Labs  Lab 09/23/20 1800  LIPASE 44   No results for input(s): AMMONIA in the last 168 hours. Coagulation Profile: No results for input(s): INR, PROTIME in the last 168 hours. Cardiac Enzymes: No results for input(s): CKTOTAL, CKMB, CKMBINDEX, TROPONINI in the  last 168 hours. BNP (last 3 results) No results for input(s): PROBNP in the last 8760 hours. HbA1C: No results for input(s): HGBA1C in the last 72 hours. CBG: No results for input(s): GLUCAP in the last 168 hours. Lipid Profile: No results for input(s): CHOL, HDL, LDLCALC, TRIG, CHOLHDL, LDLDIRECT in the last 72 hours. Thyroid Function Tests: No results for input(s): TSH, T4TOTAL, FREET4, T3FREE, THYROIDAB in the last 72 hours. Anemia Panel: No results for input(s): VITAMINB12, FOLATE, FERRITIN, TIBC, IRON, RETICCTPCT in the last 72 hours. Sepsis Labs: No results for input(s): PROCALCITON, LATICACIDVEN in the last 168 hours.  Recent Results (from the past 240 hour(s))  Resp Panel by RT PCR (RSV, Flu A&B, Covid) - Nasopharyngeal Swab     Status: None   Collection Time: 09/23/20  8:01 PM   Specimen: Nasopharyngeal Swab  Result Value Ref Range Status   SARS Coronavirus 2 by RT PCR NEGATIVE NEGATIVE Final    Comment: (NOTE) SARS-CoV-2 target nucleic acids are NOT DETECTED.  The SARS-CoV-2 RNA is generally detectable in upper respiratoy specimens during the acute phase of infection. The lowest concentration of SARS-CoV-2 viral copies this assay can detect is 131 copies/mL. A negative result does not preclude SARS-Cov-2 infection and should not be used as the sole basis for treatment or other patient management decisions. A negative result may occur with  improper specimen collection/handling, submission of specimen other than nasopharyngeal swab, presence of viral mutation(s) within the areas targeted by this assay, and inadequate number of viral copies (<131 copies/mL). A negative result must be combined with clinical observations, patient history, and epidemiological information. The expected result is Negative.  Fact Sheet for Patients:   Fact Sheet for Healthcare Providers:  09/25/20  This test is no  t yet approved or cleared by the 09/25/20 FDA and  has been authorized for detection and/or diagnosis of SARS-CoV-2 by FDA under an Emergency Use Authorization (EUA). This EUA will remain  in effect (meaning this test can be used) for the duration of the COVID-19 declaration under Section 564(b)(1) of the  Act, 21 U.S.C. section 360bbb-3(b)(1), unless the authorization is terminated or revoked sooner.     Influenza A by PCR NEGATIVE NEGATIVE Final   Influenza B by PCR NEGATIVE NEGATIVE Final    Comment: (NOTE) The Xpert Xpress SARS-CoV-2/FLU/RSV assay is intended as an aid in  the diagnosis of influenza from Nasopharyngeal swab specimens and  should not be used as a sole basis for treatment. Nasal washings and  aspirates are unacceptable for Xpert Xpress SARS-CoV-2/FLU/RSV  testing.  Fact Sheet for Patients: https://www.moore.com/  Fact Sheet for Healthcare Providers: https://www.young.biz/  This test is not yet approved or cleared by the [place] FDA and  has been authorized for detection and/or diagnosis of SARS-CoV-2 by  FDA under an Emergency Use Authorization (EUA). This EUA will remain  in effect (meaning this test can be used) for the duration of the  Covid-19 declaration under Section 564(b)(1) of the Act, 21  U.S.C. section 360bbb-3(b)(1), unless the authorization is  terminated or revoked.    Respiratory Syncytial Virus by PCR NEGATIVE NEGATIVE Final    Comment: (NOTE) Fact Sheet for Patients: https://www.moore.com/  Fact Sheet for Healthcare Providers: https://www.young.biz/  This test is not yet approved or cleared by the [place] FDA and  has been authorized for detection and/or diagnosis of SARS-CoV-2 by  FDA under an Emergency Use Authorization (EUA). This EUA will remain  in effect (meaning this test can be used) for the duration of the  COVID-19 declaration under  Section 564(b)(1) of the Act, 21 U.S.C.  section 360bbb-3(b)(1), unless the authorization is terminated or  revoked. Performed at [facility], [address] [place], [place] [address]   Culture, Urine     Status: None   Collection Time: 09/24/20  2:00 PM   Specimen: Urine, Random  Result Value Ref Range Status   Specimen Description   Final    URINE, RANDOM Performed at [facility], [address], [place], [place] [address]    Special Requests   Final    NONE Performed at [facility], [address], [place], [place] [address]    Culture   Final    NO GROWTH Performed at [facility], [address] [place], [place] [address]    Report Status 09/25/2020 FINAL  Final  Body fluid culture     Status: None (Preliminary result)   Collection Time: 09/25/20 11:24 AM   Specimen: PATH Cytology Pleural fluid  Result Value Ref Range Status   Specimen Description   Final    PLEURAL LT Performed at [facility], [address], [place], [place] [address]    Special Requests   Final    NONE Performed at [facility], [address], [place], [place] [address]    Gram Stain   Final    FEW WBC PRESENT, PREDOMINANTLY MONONUCLEAR NO ORGANISMS SEEN    Culture   Final    NO GROWTH < 24 HOURS Performed at [facility], [address] [place], [place] [address]    Report Status PENDING  Incomplete     RN Pressure Injury Documentation:     Estimated body mass index is 31.05 kg/m as calculated from the following:   Height as of this encounter:  (1.88 m).   Weight as of this encounter: 109.7 kg.  Malnutrition Type: Nutrition Problem: Inadequate oral intake Etiology: poor appetite, other (see comment) Malnutrition Characteristics: Signs/Symptoms: per patient/family report  Nutrition Interventions: Interventions: Ensure Enlive (each supplement provides 350kcal and 20  grams of protein), MVI  Radiology Studies: DG CHEST PORT 1 VIEW  Result Date: 09/26/2020 CLINICAL DATA:  Shortness of breath. EXAM: PORTABLE CHEST 1 VIEW COMPARISON:  09/25/2020. FINDINGS: Prior CABG. Left atrial appendage clip in stable position. Stable cardiomegaly. No pulmonary venous congestion. Calcified pulmonary nodules again noted consistent prior granulomas disease. Bibasilar interstitial prominence noted on today's exam. Persistent left-sided pleural effusion. Findings suggest CHF. Pneumonitis cannot be excluded. No pneumothorax. IMPRESSION: Prior CABG. Left atrial appendage clip in stable position. Stable cardiomegaly. Bibasilar interstitial prominence noted on today's exam. Persistent left-sided pleural effusion. Findings suggest CHF. Electronically Signed   By: Maisie Fus  Register   On: 09/26/2020 06:52   DG Chest Port 1 View  Result Date: 09/25/2020 CLINICAL DATA:  Status post left thoracentesis. EXAM: PORTABLE CHEST 1 VIEW COMPARISON:  Earlier today. FINDINGS: Small to moderate-sized left pleural effusion with an interval significant decrease in amount. No pneumothorax. Mild left basilar atelectasis, improved. Clear right lung. Stable enlarged cardiac silhouette and post CABG changes. Thoracic spine degenerative changes. IMPRESSION: 1. No pneumothorax following left thoracentesis. 2. Small to moderate-sized left pleural effusion with an interval significant decrease in amount. 3. Mild left basilar atelectasis, improved. Electronically Signed   By: Beckie Salts M.D.   On: 09/25/2020 11:50   DG CHEST PORT 1 VIEW  Result Date: 09/25/2020 CLINICAL DATA:  Left pleural effusion. Short of breath over the last several days. Increased weakness. EXAM: PORTABLE CHEST 1 VIEW COMPARISON:  09/23/2020 and older exams. FINDINGS: Moderate to large left pleural effusion appears mildly increased from the recent prior study. No visualized right pleural effusion.  No pneumothorax. Presumed atelectasis  associated with the left pleural effusion. Lungs otherwise clear. Stable changes from prior cardiac surgery. IMPRESSION: 1. Mild interval increase in the size of the left pleural effusion compared to the study dated 09/23/2020. Pleural effusion is moderate to large. 2. No convincing pneumonia and no evidence of pulmonary edema. No other change. Electronically Signed   By: Amie Portland M.D.   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 Request is made for diagnostic and therapeutic left thoracentesis. EXAM: ULTRASOUND GUIDED DIAGNOSTIC AND THERAPEUTIC LEFT THORACENTESIS MEDICATIONS: 10 mL 1% lidocaine COMPLICATIONS: SIR Level A - No therapy, no consequence. PROCEDURE: An ultrasound guided thoracentesis was thoroughly discussed with the patient and questions answered. The benefits, risks, alternatives and complications were also discussed. The patient understands and wishes to proceed with the procedure. Written consent was obtained. Ultrasound was performed to localize and mark an adequate pocket of fluid in the left chest. The area was then prepped and draped in the normal sterile fashion. 1% Lidocaine was used for local anesthesia. Under ultrasound guidance a 6 Fr Safe-T-Centesis catheter was introduced. Thoracentesis was performed. The catheter was removed and a dressing applied. FINDINGS: A total of approximately 1.85 L of dark red fluid was removed. Procedure was stopped after 1.85 L secondary to patient's BP 78/40, repeat 85/49)- at that time, patient stated she was lightheaded with blurred vision. He was placed in RT with improvement of BP (92/51, repeat 106/56) and resolution of symptoms. Samples were sent to the laboratory as requested by the clinical team. IMPRESSION:  Successful ultrasound guided left thoracentesis yielding  1.85 L of pleural fluid. Read by: Elwin Mocha, PA-C Electronically Signed   By: Corlis Leak M.D.   On: 09/25/2020 12:04   Scheduled Meds: . feeding supplement  237 mL Oral BID BM  . fluconazole  200 mg Oral Daily  . metoprolol tartrate  12.5 mg Oral q AM  . multivitamin with minerals  1 tablet Oral Daily  . pantoprazole  40 mg Oral QHS  . rivaroxaban  20 mg Oral Q supper  . saccharomyces boulardii  250 mg Oral BID  . tamsulosin  0.4 mg Oral Daily   Continuous Infusions:   LOS: 2 days    Merlene Laughter, DO [facility] PAGER is on AMION  If 7PM-7AM, please contact night-coverage [email or web address]

## 2020-09-26 NOTE — Consult Note (Signed)
CARDIOLOGY CONSULT NOTE  Patient ID: [name] Rice MRN: [id number] DOB/AGE: 11-Feb-1951 69 y.o.  Admit date: 09/23/2020 Attending physician: Merlene LaughterSheikh, Omair Latif, DO Primary Physician:  Richmond CampbellKaplan, Kristen W., PA-C Outpatient Cardiologist: Tessa LernerSunit Lurae Hornbrook, DO, [facility] Inpatient Cardiologist: Tessa LernerSunit Davontae Prusinski, DO, [facility]  Chief complaint:Generalized weakness, nausea/vomiting Reason of consultation: Heart failure management  HPI:  [name] Rice is a 69 y.o. 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  [facility] and seen in consultation in early November 2021 for preoperative stratification prior to upcoming ERCP and laparoscopic cholecystectomy.  Patient was recently hospitalized from 09/14/2020 through 09/22/2020 and discharged home with PT OT.  Patient states that he was doing well at home but started experiencing generalized tiredness, fatigue, decreased oral intake, accompanied by nausea and vomiting and a result came back to the hospital for further evaluation and management.  Cardiology has been consulted during this hospitalization for evaluation and management of congestive heart failure.  At the time of evaluation patient is resting in bed comfortably watching TV.  Prior to this hospitalization patient denied chest pain at rest or with effort related activities.  He did have effort related  dyspnea.  He denies paroxysmal nocturnal dyspnea or orthopnea.  But did have lower extremity swelling.  On admission BNP was elevated (no prior values for comparison) and chest x-ray noted pleural effusion as well as pulmonary vascular congestion.  Patient also underwent ultrasound-guided thoracentesis yesterday with removal of approximately 1.85L of fluid.  The procedure was stopped secondary to hypotension and symptoms resolved with reverse Trendelenburg.  Repeat chest x-ray this morning noted persistent left-sided pleural effusion findings suggestive of congestive heart failure.  ALLERGIES: No Known Allergies  PAST MEDICAL HISTORY: Past Medical History:  Diagnosis Date  . Atrial fibrillation (HCC)   . Coronary artery disease   . Dysrhythmia   . Hypertension   . LBBB (left bundle branch block)   History of cholelithiasis  PAST SURGICAL HISTORY: Past Surgical History:  Procedure Laterality Date  . CARDIOVERSION N/A 09/28/2016   Procedure: CARDIOVERSION;  Surgeon: Yates DecampJay Ganji, MD;  [facility];  Service: Cardiovascular;  Laterality: N/A;  . CHOLECYSTECTOMY N/A 09/18/2020   Procedure: LAPAROSCOPIC PARTIAL CHOLECYSTECTOMY;  Surgeon: Ovidio KinNewman, David, MD;  Location: [facility] ORS;  Service: General;  Laterality: N/A;  . CLIPPING OF ATRIAL APPENDAGE Left 07/29/2020   Procedure: CLIPPING OF ATRIAL APPENDAGE;  Surgeon: Linden DolinAtkins, Broadus Z, MD;  Location: [facility] OR;  Service: Open Heart Surgery;  Laterality: Left;  . CORONARY ARTERY BYPASS GRAFT N/A 07/29/2020   Procedure: CORONARY ARTERY BYPASS GRAFTING (CABG) TIMES FOUR USING BILATERAL INTERNAL MAMMARIES AND LEFT RADIAL ARTERY;  Surgeon: Linden DolinAtkins, Broadus Z, MD;  Location: [facility] OR;  Service: Open Heart Surgery;  Laterality: N/A;  . ERCP N/A 09/17/2020   Procedure: ENDOSCOPIC RETROGRADE CHOLANGIOPANCREATOGRAPHY (ERCP);  Surgeon: Meryl DareStark, Malcolm T, MD;  [facility];  Service: Endoscopy;  Laterality: N/A;  . LEFT HEART CATH AND CORONARY  ANGIOGRAPHY N/A 07/25/2020   Procedure: LEFT HEART CATH AND CORONARY ANGIOGRAPHY;  Surgeon: Yates DecampGanji, Jay, MD;  [facility];  Service: Cardiovascular;  Laterality: N/A;  . MAZE N/A 07/29/2020   Procedure: MAZE;  Surgeon: Linden Dolin, MD;  Location: [facility] OR;  Service: Open Heart Surgery;  Laterality: N/A;  . NO PAST SURGERIES    . RADIAL ARTERY HARVEST Left 07/29/2020   Procedure: RADIAL ARTERY HARVEST;  Surgeon: Linden Dolin, MD;  Location: [facility] OR;  Service: Open Heart Surgery;  Laterality: Left;  . REMOVAL OF STONES  09/17/2020   Procedure: REMOVAL OF STONES;  Surgeon: Meryl Dare, MD;  Location: [facility] ENDOSCOPY;  Service: Endoscopy;;  . Dennison Mascot  09/17/2020   Procedure: SPHINCTEROTOMY;  Surgeon: Meryl Dare, MD;  Location: [facility] ENDOSCOPY;  Service: Endoscopy;;  . TEE WITHOUT CARDIOVERSION N/A 07/29/2020   Procedure: TRANSESOPHAGEAL ECHOCARDIOGRAM (TEE);  Surgeon: Linden Dolin, MD;  Location: [facility] OR;  Service: Open Heart Surgery;  Laterality: N/A;    FAMILY HISTORY: The patient family history includes Hypertension in an other family member.   SOCIAL HISTORY:  The patient  reports that he has never smoked. He has never used smokeless tobacco. He reports that he does not drink alcohol and does not use drugs.  MEDICATIONS: Current Outpatient Medications  Medication Instructions  . acetaminophen (TYLENOL) 1,000 mg, Oral, Every 6 hours PRN  . amLODipine (NORVASC) 10 MG tablet TAKE 1 TABLET BY MOUTH  DAILY  . amoxicillin-clavulanate (AUGMENTIN) 875-125 MG tablet 1 tablet, Oral, 2 times daily  . aspirin EC 81 mg, Oral, Daily, Swallow whole.  . Ensure (ENSURE) 1 Can, Oral, See admin instructions, Drink 1 can of VANILLA ENSURE by mouth once a day  . HYDROcodone-acetaminophen (NORCO/VICODIN) 5-325 MG tablet 1 tablet, Oral, Every 4 hours PRN  . loperamide (IMODIUM A-D) 2 mg, Oral, 4 times daily PRN  . metoprolol tartrate (LOPRESSOR) 12.5 mg, Oral, Daily  .  olmesartan (BENICAR) 40 mg, Oral, Every morning  . rosuvastatin (CRESTOR) 40 mg, Oral, Daily  . XARELTO 20 MG TABS tablet TAKE 1 TABLET BY MOUTH IN  THE EVENING AFTER DINNER    Review of Systems  Constitutional: Positive for decreased appetite and malaise/fatigue. Negative for chills and fever.  HENT: Negative for hoarse voice and nosebleeds.   Eyes: Negative for discharge, double vision and pain.  Cardiovascular: Positive for dyspnea on exertion and leg swelling. Negative for chest pain, claudication, near-syncope, orthopnea, palpitations, paroxysmal nocturnal dyspnea and syncope.  Respiratory: Positive for shortness of breath. Negative for hemoptysis.   Skin: Positive for rash.  Musculoskeletal: Negative for muscle cramps and myalgias.  Gastrointestinal: Positive for nausea (On admission, now resolved) and vomiting (On admission, now resolved). Negative for abdominal pain, constipation, diarrhea, hematemesis, hematochezia and melena.  Neurological: Negative for dizziness and light-headedness.  All other systems reviewed and are negative.  PHYSICAL EXAM: Vitals with BMI 09/26/2020 09/26/2020 09/26/2020  Height - - -  Weight - - -  BMI - - -  Systolic 117 112 161  Diastolic 66 65 59  Pulse 85 82 95    Intake/Output Summary (Last 24 hours) at 09/26/2020 1927 Last data filed at 09/26/2020 0900 Gross per 24 hour  Intake 400 ml  Output 1155 ml  Net -755 ml    Net IO Since Admission: -179.55 mL [09/26/20 1927]  CONSTITUTIONAL:Appears older than stated age, hemodynamically stable, no acute distress. SKIN: Skin is warm and dry. No cyanosis. No pallor.  Rash noted over the abdomen, anterior chest, right lower extremity HEAD: Normocephalic and atraumatic.  EYES: No scleral icterus MOUTH/THROAT: Moist  oral membranes.  NECK: No JVD present. No thyromegaly noted. No carotid bruits  LYMPHATIC: No visible cervical adenopathy.  CHEST Normal respiratory effort. No intercostal  retractions.  Sternotomy site is healing well. LUNGS:Minimal rales, decreased breath sounds bilaterally, no stridor. No wheezes. CARDIOVASCULAR:Irregularly irregular, positive S1-S2, no murmurs rubs or gallops appreciated. ABDOMINAL: Soft, nontender, nondistended, positive bowel sounds in all 4 quadrants, no apparent ascites.  JP drain in place, rash noted.  EXTREMITIES: +1 peripheral edema.  Left radial site is healing well. HEMATOLOGIC: No significant bruising NEUROLOGIC: Oriented to person, place, and time. Nonfocal. Normal muscle tone.  PSYCHIATRIC: Normal mood and affect. Normal behavior. Cooperative.  RADIOLOGY: DG CHEST PORT 1 VIEW  Result Date: 09/26/2020 CLINICAL DATA:  Shortness of breath, weakness EXAM: PORTABLE CHEST 1 VIEW COMPARISON:  09/26/2020 FINDINGS: Prior median sternotomy and CABG. Heart is borderline in size. Left basilar atelectasis or infiltrate and small left effusion with slight improvement since prior study. Right lung clear. No acute bony abnormality. IMPRESSION: Continued small left pleural effusion with left base atelectasis or infiltrate with some improvement in aeration since prior study. Electronically Signed   By: Charlett Nose M.D.   On: 09/26/2020 19:05   DG CHEST PORT 1 VIEW  Result Date: 09/26/2020 CLINICAL DATA:  Shortness of breath. EXAM: PORTABLE CHEST 1 VIEW COMPARISON:  09/25/2020. FINDINGS: Prior CABG. Left atrial appendage clip in stable position. Stable cardiomegaly. No pulmonary venous congestion. Calcified pulmonary nodules again noted consistent prior granulomas disease. Bibasilar interstitial prominence noted on today's exam. Persistent left-sided pleural effusion. Findings suggest CHF. Pneumonitis cannot be excluded. No pneumothorax. IMPRESSION: Prior CABG. Left atrial appendage clip in stable position. Stable cardiomegaly. Bibasilar interstitial prominence noted on today's exam. Persistent left-sided pleural effusion. Findings suggest CHF.  Electronically Signed   By: Maisie Fus  Register   On: 09/26/2020 06:52   DG Chest Port 1 View  Result Date: 09/25/2020 CLINICAL DATA:  Status post left thoracentesis. EXAM: PORTABLE CHEST 1 VIEW COMPARISON:  Earlier today. FINDINGS: Small to moderate-sized left pleural effusion with an interval significant decrease in amount. No pneumothorax. Mild left basilar atelectasis, improved. Clear right lung. Stable enlarged cardiac silhouette and post CABG changes. Thoracic spine degenerative changes. IMPRESSION: 1. No pneumothorax following left thoracentesis. 2. Small to moderate-sized left pleural effusion with an interval significant decrease in amount. 3. Mild left basilar atelectasis, improved. Electronically Signed   By: Beckie Salts M.D.   On: 09/25/2020 11:50   DG CHEST PORT 1 VIEW  Result Date: 09/25/2020 CLINICAL DATA:  Left pleural effusion. Short of breath over the last several days. Increased weakness. EXAM: PORTABLE CHEST 1 VIEW COMPARISON:  09/23/2020 and older exams. FINDINGS: Moderate to large left pleural effusion appears mildly increased from the recent prior study. No visualized right pleural effusion.  No pneumothorax. Presumed atelectasis associated with the left pleural effusion. Lungs otherwise clear. Stable changes from prior cardiac surgery. IMPRESSION: 1. Mild interval increase in the size of the left pleural effusion compared to the study dated 09/23/2020. Pleural effusion is moderate to large. 2. No convincing pneumonia and no evidence of pulmonary edema. No other change. Electronically Signed   By: Amie Portland M.D.   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 Request is made for diagnostic and therapeutic left thoracentesis. EXAM:  ULTRASOUND  GUIDED DIAGNOSTIC AND THERAPEUTIC LEFT THORACENTESIS MEDICATIONS: 10 mL 1% lidocaine COMPLICATIONS: SIR Level A - No therapy, no consequence. PROCEDURE: An ultrasound guided thoracentesis was thoroughly discussed with the patient and questions answered. The benefits, risks, alternatives and complications were also discussed. The patient understands and wishes to proceed with the procedure. Written consent was obtained. Ultrasound was performed to localize and mark an adequate pocket of fluid in the left chest. The area was then prepped and draped in the normal sterile fashion. 1% Lidocaine was used for local anesthesia. Under ultrasound guidance a 6 Fr Safe-T-Centesis catheter was introduced. Thoracentesis was performed. The catheter was removed and a dressing applied. FINDINGS: A total of approximately 1.85 L of dark red fluid was removed. Procedure was stopped after 1.85 L secondary to patient's BP 78/40, repeat 85/49)- at that time, patient stated she was lightheaded with blurred vision. He was placed in RT with improvement of BP (92/51, repeat 106/56) and resolution of symptoms. Samples were sent to the laboratory as requested by the clinical team. IMPRESSION: Successful ultrasound guided left thoracentesis yielding 1.85 L of pleural fluid. Read by: Elwin Mocha, PA-C Electronically Signed   By: Corlis Leak M.D.   On: 09/25/2020 12:04    LABORATORY DATA: Lab Results  Component Value Date   WBC 10.3 09/26/2020   HGB 10.7 (L) 09/26/2020   HCT 35.6 (L) 09/26/2020   MCV 93.4 09/26/2020   PLT 292 09/26/2020    Recent Labs  Lab 09/26/20 0543  NA 133*  K 3.5  CL 100  CO2 27  BUN <5*  CREATININE 0.95  CALCIUM 7.9*  PROT 5.1*  BILITOT 1.0  ALKPHOS 153*  ALT 16  AST 20  GLUCOSE 89    Lipid Panel     Component Value Date/Time   CHOL 78 07/26/2020 0515   CHOL 143 07/10/2019 0906   TRIG 92 07/26/2020 0515   HDL 24 (L) 07/26/2020 0515   HDL 51 07/10/2019 0906   CHOLHDL 3.3 07/26/2020  0515   VLDL 18 07/26/2020 0515   LDLCALC 36 07/26/2020 0515   LDLCALC 74 07/10/2019 0906    BNP (last 3 results) Recent Labs    07/24/20 0748  BNP 206.5*    HEMOGLOBIN A1C Lab Results  Component Value Date   HGBA1C 5.2 07/26/2020   MPG 102.54 07/26/2020    Cardiac Panel (last 3 results) Recent Labs    07/24/20 0210  CKTOTAL 39*    Lab Results  Component Value Date   CKTOTAL 39 (L) 07/24/2020     TSH Recent Labs    07/02/20 1604 07/24/20 0748 09/16/20 0506  TSH 1.898 0.941 1.083     CARDIAC DATABASE: EKG: 09/14/2020: Atrial fibrillation, 89 bpm, left bundle branch block, without underlying injury pattern.  Coronary artery bypass grafting: 07/29/2020 (by Dr. Vickey Sages at [facility]): Left Internal Mammary Artery to Distal Left Anterior Descending Coronary Artery; pedicled RIMAGraft to Posterior Descending Coronary Artery; left radial arteryGraft to Obtuse Marginal Branch of Left Circumflex Coronary Artery and ramus intermedius as a sequenced graft. Bi-atrial Maze procedure and left atrial appendage clipping  Echocardiogram: 07/24/20: LVEF 55 to 60%, LVH, diastolic parameters are indeterminate, RV systolic function normal, RV size mildly enlarged, mild MR, aortic valve sclerosis without stenosis.  07/29/2020 post CABG: 45-50%, LVH, no regional wall motion abnormalities, Mild MR.   Stress Testing:  02/18/2020: No previous exam available for comparison. Lexiscan nuclear stress test performed using 1-day protocol. Stress EKG is non-diagnostic, as this  is pharmacological stress test. In addition, rest and stress EKG showed atrial fibrillation with slow ventricular response, anterolateral T wave inversion. Stress LVEF 77%. Normal myocardial perfusion. Low risk study.  Heart Catheterization: 07/25/20: RCA: Proximal RCA 80% stenosis, large vessel with mild disease in PL and PDA branches. A secondary PL branch is subtotally occluded. LM: Distal 30-40%  stenosis. LAD: LAD diffusely diseased, proximal diffuse 80% followed by tandem 90% stenosis. Large D1 with moderate diffuse disease and proximal and mid tandem 70 and 80% stenosis. Has secondary branches and is tortuous. Cx and RI: Co-dominant Cx with Ostial circumflex 99% involving a moderate sized ramus with ostial 80 to 90% and proximal 90% stenosis. LV: Normal LVEDP. No pressure gradient across the aortic valve. Patent LIMA and RIMA and RIMA has ostial 20-30% stenosis. Subclavian arteries widely patent.  Right radial diffusely disease by [place]. 4mL contrast used.   Carotid duplex: 07/27/2020:  Right Carotid: Velocities in the right ICA are consistent with a 1-39% stenosis.  Left Carotid: Velocities in the left ICA are consistent with a 60-79% stenosis.  Vertebrals: Bilateral vertebral arteries demonstrate antegrade flow.   IMPRESSION & RECOMMENDATIONS: [name] Rice is a 69 y.o. 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  Dyspnea on exertion: Multifactorial Patient has a complex past medical history as noted above and recently underwent MRCP, ERCP, partial laparoscopic cholecystectomy and discharged home on 09/22/2020.  Shortly thereafter presents back to the hospital on 09/23/2020 complaining of generalized weakness and shortness of breath.  Patient's shortness of breath is multifactorial given his bilateral pleural effusion requiring left-sided thoracentesis with removal of 1.85 L of  fluid, from fluid shifts from recent procedures and surgeries, persistent atrial fibrillation, and mildly reduced left ventricular systolic function.  Given the patient's shortness of breath, lower extremity swelling, BNP 206 on admission, and vascular congestion on chest x-ray consistent with acute on chronic exacerbation of heart failure with reduced EF.  His acute exacerbation is most likely secondary to multiple etiologies as noted above. Plan Limited echocardiogram to reevaluate LV function.   Given the low albumin level recommend transitioning him from Lasix to Bumex 1 mg IV push twice daily as long as the SBP is greater than 100 mmHg.   Recommend TED hose to help with possible chronic venous insufficiency and low oncotic pressures.  Check orthostatic VS. Recommend discontinuation of amlodipine on discharge.   Home dose of olmesartan held due to low blood pressure. Restart when BP improves at lower dose.  Transition Lopressor to Toprol XL.   Pleural effusion, status post thoracentesis: Management per primary team.  Encourage incentive spirometry at bedside 10 times an hour every hour.   Nonsustained ventricular tachycardia, asymptomatic: Transition from Lopressor 12.5 mg p.o. daily to Toprol-XL 12.5 mg p.o. daily. Limited echocardiogram as discussed above. Keep potassium at 4 and magnesium at 2. Start magnesium oxide 200 mg p.o. twice daily. Start Klor-Con packets 20 mEq p.o. daily BMP in the morning Continue telemetry  Generalized rash: Managed per primary team  Status post MRCP, ERCP, partial laparoscopic cholecystectomy: Defer management to primary team and surgical team  Persistent atrial fibrillation, status post biatrial maze procedure:  Continue beta-blocker therapy and Xarelto.  Multivessel CAD status post four-vessel bypass surgery without angina pectoris: Continue beta-blocker therapy,statin therapy held by primary team, per d/c summary recommended restarting aspirin 81 mg  p.o. daily as of 09/25/2020 per GI.  Benign essential hypertension: Blood pressure within acceptable range.  Currently managed per primary team  Plan discussed with attending and will follow the patient with you.    Total encounter time 85 minutes. *Total Encounter Time as defined by the Centers for Medicare and Medicaid Services includes, in addition to the face-to-face time of a patient visit (documented in the note above) non-face-to-face time: obtaining and reviewing outside history, ordering and reviewing medications, tests or procedures, care coordination (communications with other health care professionals or caregivers) and documentation in the medical record.  Patient's questions and concerns were addressed to his satisfaction. He voices understanding of the instructions provided during this encounter.   This note was created using a voice recognition software as a result there may be grammatical errors inadvertently enclosed that do not reflect the nature of this encounter. Every attempt is made to correct such errors.  Delilah Shan [facility]  Pager: [phone] Office: [phone] 09/26/2020, 7:27 PM

## 2020-09-26 NOTE — Progress Notes (Signed)
Pt noted with rash on upper trunk area, slight itchy at times. Zosyn infusing currently, am med have been administered. SRP, RN

## 2020-09-27 ENCOUNTER — Inpatient Hospital Stay (HOSPITAL_COMMUNITY): Payer: Medicare Other

## 2020-09-27 DIAGNOSIS — R21 Rash and other nonspecific skin eruption: Secondary | ICD-10-CM

## 2020-09-27 DIAGNOSIS — I10 Essential (primary) hypertension: Secondary | ICD-10-CM | POA: Diagnosis not present

## 2020-09-27 DIAGNOSIS — D62 Acute posthemorrhagic anemia: Secondary | ICD-10-CM

## 2020-09-27 DIAGNOSIS — I4811 Longstanding persistent atrial fibrillation: Secondary | ICD-10-CM

## 2020-09-27 DIAGNOSIS — R112 Nausea with vomiting, unspecified: Secondary | ICD-10-CM | POA: Diagnosis not present

## 2020-09-27 LAB — COMPREHENSIVE METABOLIC PANEL
ALT: 17 U/L (ref 0–44)
AST: 25 U/L (ref 15–41)
Albumin: 2.3 g/dL — ABNORMAL LOW (ref 3.5–5.0)
Alkaline Phosphatase: 165 U/L — ABNORMAL HIGH (ref 38–126)
Anion gap: 12 (ref 5–15)
BUN: 8 mg/dL (ref 8–23)
CO2: 27 mmol/L (ref 22–32)
Calcium: 8 mg/dL — ABNORMAL LOW (ref 8.9–10.3)
Chloride: 96 mmol/L — ABNORMAL LOW (ref 98–111)
Creatinine, Ser: 1.06 mg/dL (ref 0.61–1.24)
GFR, Estimated: >60 mL/min (ref >60)
Glucose, Bld: 108 mg/dL — ABNORMAL HIGH (ref 70–99)
Potassium: 3.6 mmol/L (ref 3.5–5.1)
Sodium: 135 mmol/L (ref 135–145)
Total Bilirubin: 0.9 mg/dL (ref 0.3–1.2)
Total Protein: 5.5 g/dL — ABNORMAL LOW (ref 6.5–8.1)

## 2020-09-27 LAB — CBC WITH DIFFERENTIAL/PLATELET
Abs Immature Granulocytes: 0.08 10*3/uL — ABNORMAL HIGH (ref 0.00–0.07)
Basophils Absolute: 0.1 10*3/uL (ref 0.0–0.1)
Basophils Relative: 1 %
Eosinophils Absolute: 1.4 10*3/uL — ABNORMAL HIGH (ref 0.0–0.5)
Eosinophils Relative: 14 %
HCT: 38.2 % — ABNORMAL LOW (ref 39.0–52.0)
Hemoglobin: 11.5 g/dL — ABNORMAL LOW (ref 13.0–17.0)
Immature Granulocytes: 1 %
Lymphocytes Relative: 15 %
Lymphs Abs: 1.4 10*3/uL (ref 0.7–4.0)
MCH: 28 pg (ref 26.0–34.0)
MCHC: 30.1 g/dL (ref 30.0–36.0)
MCV: 92.9 fL (ref 80.0–100.0)
Monocytes Absolute: 1 10*3/uL (ref 0.1–1.0)
Monocytes Relative: 10 %
Neutro Abs: 5.8 10*3/uL (ref 1.7–7.7)
Neutrophils Relative %: 59 %
Platelets: 289 10*3/uL (ref 150–400)
RBC: 4.11 MIL/uL — ABNORMAL LOW (ref 4.22–5.81)
RDW: 17.2 % — ABNORMAL HIGH (ref 11.5–15.5)
WBC: 9.7 10*3/uL (ref 4.0–10.5)
nRBC: 0 % (ref 0.0–0.2)

## 2020-09-27 LAB — ECHOCARDIOGRAM LIMITED
Height: 74 in
Weight: 3869.51 oz

## 2020-09-27 LAB — MAGNESIUM: Magnesium: 1.9 mg/dL (ref 1.7–2.4)

## 2020-09-27 LAB — PHOSPHORUS: Phosphorus: 2.9 mg/dL (ref 2.5–4.6)

## 2020-09-27 LAB — BRAIN NATRIURETIC PEPTIDE: B Natriuretic Peptide: 151.4 pg/mL — ABNORMAL HIGH (ref 0.0–100.0)

## 2020-09-27 MED ORDER — DIPHENHYDRAMINE HCL 50 MG/ML IJ SOLN
25.0000 mg | Freq: Once | INTRAMUSCULAR | Status: AC
  Administered 2020-09-27: 25 mg via INTRAVENOUS
  Filled 2020-09-27: qty 1

## 2020-09-27 MED ORDER — PERFLUTREN LIPID MICROSPHERE
1.0000 mL | INTRAVENOUS | Status: DC | PRN
  Filled 2020-09-27: qty 10

## 2020-09-27 MED ORDER — DIPHENHYDRAMINE HCL 25 MG PO CAPS
25.0000 mg | ORAL_CAPSULE | Freq: Three times a day (TID) | ORAL | Status: AC | PRN
  Administered 2020-09-27 – 2020-09-29 (×3): 25 mg via ORAL
  Filled 2020-09-27 (×3): qty 1

## 2020-09-27 MED ORDER — FAMOTIDINE IN NACL 20-0.9 MG/50ML-% IV SOLN
20.0000 mg | Freq: Two times a day (BID) | INTRAVENOUS | Status: DC
  Administered 2020-09-27 – 2020-09-28 (×3): 20 mg via INTRAVENOUS
  Filled 2020-09-27 (×3): qty 50

## 2020-09-27 NOTE — Progress Notes (Signed)
PROGRESS NOTE    [name] Rice  QAS:[id number] DOB: 11-29-1950 DOA: 09/23/2020 PCP: [name], PA-C   Brief Narrative:  HPI per [name] on 09/23/20 [name] Rice is a 69 y.o. male with medical history significant of chronic atrial fibrillation, CAD, s/p CABG almost 2 months ago, LBBB, chronic systolic heart failure of 45 to 50% per recent echocardiogram who was recently admitted and discharged from 09/14/2020 to 09/22/2020 for acute cholecystitis requiring ERCP with successful extraction of a stone, followed by partial laparoscopic cholecystectomy with JP drainage placement who returns to the hospital due to generalized weakness associated with decreased oral intake, abdominal pain, nausea, 2 episodes of emesis and a history of a mechanical fall at home without any significant injury. He states his diarrhea has gotten better in the past few days. He denies fever, but complains of chills and fatigue. He denies rhinorrhea, sore throat, wheezing or hemoptysis. No chest pain, palpitations, diaphoresis, PND or orthopnea. He gets occasional lower extremity edema, but states that this has improved significantly. He denies constipation, melena or hematochezia. He has had mild dysuria along with right flank pain, but no frequency, oliguria or hematuria. No polyuria, polydipsia, polyphagia or blurred vision.  ED Course: Initial vital signs were temperature 97.8 F, pulse 86, respirations 16, blood pressure 143/99 mmHg O2 sat 98% on room air.  Labs: Coronavirus and influenza PCR was negative.  Urine analysis showed large hemoglobinuria with more than 50 RBC microscopic examination.  Ketonuria 5 and proteinuria 30 mg/dL.  There was trace leukocyte esterase with 21-50 WBCs per hpf.  There were a few bacteria, mucus, but engaged and hyaline casts.  Imaging: 1 view chest radiograph shows enlarging left pleural effusion likely reflecting passive atelectasis though underlying infection or edema  could not be fully excluded.  There is vascular congestion and grading opacity in the right lung which is favored to be a combination of atelectasis and edema.  There is trace right pleural effusion as well.  CT abdomen/pelvis with contrast shows status post cholecystectomy with ill-defined fluid and edema in the operative bed.  Considerations include phlegmon, developing abscess, biloma and postoperative hematoma.  Development of right-sided urinary tract obstruction secondary to a punctate mid right ureteric stone treated close to left-sided stone at the ureteropelvic junction without significant hydronephrosis.  There is atelectasis.  There is fat and right side of the bladder within an inguinal hernia.  Please see images and full radiology report.  **Interim History  His diet was advanced to clear liquid diet and will go to soft diet.  Dietitian was consulted for further evaluation we will add probiotics.  Patient states that he feels generally weak but he is improving.  Further work-up done and he had a thoracentesis today which yielded 1.85 L and he was given a dose of IV Lasix for diuresis.  General surgery does not feel he has a bile leak and they are recommending continuing drain output.  PT OT to reevaluate today and urology evaluation still pending but Dr. [name] recommended starting the patient on tamsulosin and having follow-up in outpatient setting as he did not feel like any surgical intervention is warranted for his ureteral stone.  Patient was given a dose of IV Lasix on 09/25/2020 and will be given another dose today given his volume overload.  Likely has acute on chronic diastolic CHF.  Cardiology is consulted for further evaluation recommendations.  His antibiotics have now stopped and patient has a rash that has developed  we will provide Benadryl.  And monitor off of antibiotics.  We will also stop antifungals given his negative growth and urine culture.  On 09/26/2020 cardiology was  consulted and changed the patient to IV Bumex 1 mg twice daily and then today has been changed to p.o. Lasix 40 mg daily.  Cardiology feels that his shortness of breath is multifactorial and is improving.  Patient continues to have some lightheadedness and cardiology recommending possibly stopping or reducing the metoprolol.  Patient's rash overnight got little bit worse so we will start him on H2 blocker blocker  Assessment & Plan:   Principal Problem:   Nausea and vomiting Active Problems:   Atrial fibrillation (HCC)   Pure hypercholesterolemia   Malnutrition of moderate degree (HCC)   Acute blood loss anemia   CAD, multiple vessel   Benign hypertension   Right sided hydronephrosis with renal and ureteral calculus obstruction   Budding yeast in U/A detected   Generalized weakness  Mild Right sided hydronephrosis with renal and ureteral calculus obstruction and ? UTI poA -Continue IV hydration and reduce the Rate to 75 mL/hr; Has development of Right Sideded Urinary Tract Obstruction due to Punctate Mid Ureteric Stone -Tamsulosin 0.4 mg p.o. twice daily and will change to Daily  -Discussed with Urology Dr. Gwynneth Macleod and he will evaluate eventually  -Leukocytosis on admission of 11.6 which is now improved to 9.2 yesterday but could have been in the setting of continued dehydration: WBC is now 9.7 -patient's urinalysis showed a hazy appearance with large hemoglobin, 5 ketones, trace leukocytes, negative nitrites, few bacteria, greater than 50 RBCs per high-power field, and 21-50 WBCs; urine culture still pending but was obtained after he is placed on antibiotics; urine culture showed no growth so we will stop antibiotics -Patient is also started on p.o. fluconazole and will now discontinue -Antibiotics have now been discontinued and general surgery feels that he does not need more antibiotics from a gallbladder standpoint; patient did develop a rash likely in the setting of his antibiotics-we  will need to continue to monitor and watch off of antibiotics  Generalized weakness and poor p.o. intake -Was In the setting of dehydration and poor p.o. intake and recent gallbladder issues and possible UTI -IV fluid hydration is now stopped and he had a worsened left-sided pleural effusion.  He was given a dose of IV Lasix and had a thoracentesis.  Cardiology evaluated and changed his Lasix to IV Bumex.  Bumex is now being changed to p.o. Lasix -PT OT to further evaluate and treat and recommending Home Health -Recommend OOB to Chair  Budding yeast in U/A detected -Will treat given symptoms and possible need for urological procedure; urology feels that no surgical intervention is warranted at this time but they will come evaluate the patient later on -Fluconazole 150 mg p.o. daily changed to 200 p.o. daily now.  Fluid and Edema in the Operative Bed -? Phlegmon/Developing Abscess, Biloma, Postoperative Hematoma -Will discuss with General Surgery and they do not feel that he has an abscess and feel that the findings were a posterior wall of his gallbladder -They recommend no Follow up and antibiotics now been stopped  Atrial fibrillation, chronic (HCC) -CHA?DS?-VASc Score of at least 4. -S/p Maze Procedure on 07/29/20 -Continue metoprolol 12.5 mg p.o. daily. -Continue Xarelto for anticoagulation. -Continue to monitor on telemetry  Acquired Thrombophilia -Think of his atrial fibrillation -Patient's CHA2DS2-VASc is 4 -Continue with anticoagulation with Xarelto  Pure Hypercholesterolemia -Hold Rosuvastatin while on fluconazole and  resume now that fluconazole is being discontinued  Malnutrition of moderate degree (HCC) -Dietitian was consulted for further evaluation recommendations -They are recommending Ensure Enlive p.o. twice daily -Continue with multivitamin with minerals daily -Diet has been advanced from a clear liquid diet to soft -We will add a probiotic as  well  Normocytic Anemia -In the setting of 2 recent surgeries. -Hemoglobin higher than baseline and is slowly trending down as hemoglobin/hematocrit went from 12.0/39.2 -> 11.3/36.2 -> 10.0/33.2 -> 10.7/35.6 -> 11.5/38.2 -Continue monitor hemoglobin/hematocrit and monitor for signs and symptoms of bleeding as he is anticoagulated; currently no overt bleeding noted -Repeat CBC in a.m.  CAD, multiple vessel -Continue beta-blocker and Xarelto. -Holding a statin while using fluconazole. -Resume Statin now  Benign Hypertension -He has been holding amlodipine and olmesartan. -Continue Metoprolol 12.5 mg p.o. daily. -Monitor blood pressure and heart rate. -Blood pressures on the lower side at 106/60 -Continue monitor blood pressure per protocol  Left-Sided Pleural Effusion, poA and improved -He was given a dose of IV Lasix yesterday and will be given 1 today as he was +2.3 L since admission; Now Patient is +199 mL -Ordered a thoracentesis to be done and he had a left-sided thoracentesis which yielded 1.85 L of fluid -Ordered a Fluid analysis on the pleural fluid and showed a red color with 1109 total nucleated cell count, 50 lymphs, turbid appearance, 43 monocytes, with Gram stain WBCs and no organisms seen currently -Repeat chest x-ray showed "No pneumothorax following left thoracentesis. Small to moderate-sized left pleural effusion with an interval significant decrease in amount. Mild left basilar atelectasis, improved." -Will add Flutter Valve and Incentive Spirometry   Acute on Chronic Diastolic CHF  -Chest x-ray today showed "Prior CABG. Left atrial appendage clip in stable position. Stable cardiomegaly. Bibasilar interstitial prominence noted on today's exam. Persistent left-sided pleural effusion. Findings suggest CHF." -Given a dose of IV diuresis yesterday and will be given another today pending his blood pressure next-strict I's and O's, daily weights -BNP 151.4 -Repeat ECHO done  and showed "Left Ventricle: Left ventricular ejection fraction, by estimation, is 45 to 50%. The left ventricle has mildly decreased function. The left ventricle demonstrates regional wall motion abnormalities. Abnormal (paradoxical) septal motion consistent with post-operative status. Left ventricular diastolic parameters are indeterminate."  -We will fluid restrict to 1500 mL -IV Lasix changed to IV Bumex and now changing to po Lasix 40 mg daily  -Cardiology is consulted for further evaluation recommendations -Continue to monitor for signs and symptoms of volume overload and repeat chest x-ray in a.m. -Follow-up on cardiology recommendations and will repeat a BMP  Rash, worsening  -Diffuse whole body rash could be from contact dermatitis or medication induced -Have stopped antibiotics, PPI, and Probiotics -Provide the patient with Benadryl cream -Will start H2 Blocker BID IV and if still persists consider steroids -Likely from Zosyn  Hyponatremia -Likely hypervolemic hyponatremia; Improved and Na+ is 135 -Continue with diuresis as tolerated -Repeat CMP in a.m.   Obesity -Complicates overall prognosis and Care -Estimated body mass index is 31.05 kg/m as calculated from the following:   Height as of this encounter:  (1.88 m).   Weight as of this encounter: 109.7 kg. -Weight Loss and Dietary Counseling given   DVT prophylaxis: Anticoagulated with Xarelto Code Status: FULL CODE  Family Communication: No family present at bedside Disposition Plan: Pending further clinical improvement and tolerance of p.o. diet and evaluation by PT and OT; Will need Repeat Orthostatics in the AM  Status is: Inpatient  Remains inpatient appropriate because:Unsafe d/c plan, IV treatments appropriate due to intensity of illness or inability to take PO and Inpatient level of care appropriate due to severity of illness   Dispo: The patient is from: Home              Anticipated d/c is to: Home               Anticipated d/c date is: 1-2 days              Patient currently is not medically stable to d/c.  Consultants:   Urology  General Surgery   Cardiology  IR for Thoracentesis   Procedures: Successful image-guided left thoracentesis. Yielded 1.85 liters of dark red fluid. Procedure was stopped after 1.85 L secondary to patient's BP (78/40, repeat 85/49)- at that time, patient stated he was lightheaded with blurred vision. He was placed in RT with improvement of BP (92/51, repeat 106/56) and resolution of symptoms. Patient tolerated procedure well. No immediate complications. EBL < 1 mL.  Antimicrobials:  Anti-infectives (From admission, onward)   Start     Dose/Rate Route Frequency Ordered Stop   09/26/20 1000  fluconazole (DIFLUCAN) tablet 200 mg  Status:  Discontinued        200 mg Oral Daily 09/25/20 1355 09/26/20 1728   09/24/20 0800  piperacillin-tazobactam (ZOSYN) IVPB 3.375 g  Status:  Discontinued        3.375 g 12.5 mL/hr over 240 Minutes Intravenous Every 8 hours 09/24/20 0030 09/26/20 1151   09/24/20 0030  piperacillin-tazobactam (ZOSYN) IVPB 3.375 g        3.375 g 100 mL/hr over 30 Minutes Intravenous  Once 09/24/20 0006 09/24/20 0138   09/24/20 0015  fluconazole (DIFLUCAN) tablet 150 mg  Status:  Discontinued       Note to Pharmacy: Budding yeast urine with some urinary symptoms who will likely need to undergo cystoscopy.   150 mg Oral Daily 09/24/20 0010 09/25/20 1355        Subjective: Seen and examined at bedside he continues to have some itching in his lower extremity and has a worsening rash.  Breathing is much improved.  Continues have some lower extremity swelling but thinks it is better.  No chest pain but still has some lightheadedness when standing a little bit.  No other concerns or points at this time.  Objective: Vitals:   09/27/20 1605 09/27/20 1606 09/27/20 1607 09/27/20 1614  BP: (!) 97/58  97/66 (!) 123/59  Pulse: 87 95 84 78  Resp:     20  Temp:    97.8 F (36.6 C)  TempSrc:    Oral  SpO2: 100% 100% 99% 100%  Weight:      Height:        Intake/Output Summary (Last 24 hours) at 09/27/2020 1918 Last data filed at 09/27/2020 1842 Gross per 24 hour  Intake 1117 ml  Output 3851 ml  Net -2734 ml   Filed Weights   09/24/20 0130  Weight: 109.7 kg   Examination: Physical Exam:  Constitutional: WN/WD obese Caucasian male in NAD and appears calm and comfortable Eyes: Llids and conjunctivae normal, sclerae anicteric  ENMT: External Ears, Nose appear normal. Grossly normal hearing.  Neck: Appears normal, supple, no cervical masses, normal ROM, no appreciable thyromegaly; no JVD Respiratory: Diminished to auscultation bilaterally with coarse breath sounds worse on the L compared to the Right with slight crackles. No appreciable wheezing or rales. Normal respiratory  effort and patient is not tachypenic. No accessory muscle use. Unlabored breathing   Cardiovascular: RRR, no murmurs / rubs / gallops. S1 and S2 auscultated. 1+ extremity edema.  Abdomen: Soft, non-tender,Distended. Drain in [place]. Bowel Sounds present GU: Deferred. Musculoskeletal: No clubbing / cyanosis of digits/nails. No joint deformity upper and lower extremities.   Skin: Has a diffuse rash scattered throughout his abdomen, shoulders, back, and legs. No induration; Warm and dry.  Neurologic: CN 2-12 grossly intact with no focal deficits. Sensation intact in all 4 Extremities, DTR normal. Psychiatric: Normal judgment and insight. Alert and oriented x 3. Normal mood and appropriate affect.   Data Reviewed: I have personally reviewed following labs and imaging studies  CBC: Recent Labs  Lab 09/23/20 1800 09/24/20 0426 09/25/20 0420 09/26/20 0543 09/27/20 0509  WBC 11.6* 10.0 9.2 10.3 9.7  NEUTROABS 8.9*  --  5.2 6.6 5.8  HGB 12.0* 11.3* 10.0* 10.7* 11.5*  HCT 39.2 36.2* 33.3* 35.6* 38.2*  MCV 92.5 91.6 94.1 93.4 92.9  PLT 385 325 313 292 289    Basic Metabolic Panel: Recent Labs  Lab 09/23/20 1800 09/24/20 0426 09/25/20 0420 09/26/20 0543 09/27/20 0509  NA 136 135 136 133* 135  K 3.6 3.7 3.9 3.5 3.6  CL 101 103 102 100 96*  CO2 GLUCOSE 138* 88 83 89 108*  BUN 6* 6* 5* <5* 8  CREATININE 0.98 0.87 1.02 0.95 1.06  CALCIUM 8.6* 8.0* 7.9* 7.9* 8.0*  MG  --   --  1.7 1.9 1.9  PHOS  --   --  2.8 2.8 2.9   GFR: Estimated Creatinine Clearance: 89.1 mL/min (by C-G formula based on SCr of 1.06 mg/dL). Liver Function Tests: Recent Labs  Lab 09/23/20 1800 09/24/20 0426 09/25/20 0420 09/26/20 0543 09/27/20 0509  AST ALT ALKPHOS 236* 184* 150* 153* 165*  BILITOT 1.2 1.0 1.1 1.0 0.9  PROT 6.5 5.6* 5.2* 5.1* 5.5*  ALBUMIN 2.6* 2.1* 2.1* 2.0* 2.3*   Recent Labs  Lab 09/23/20 1800  LIPASE 44   No results for input(s): AMMONIA in the last 168 hours. Coagulation Profile: No results for input(s): INR, PROTIME in the last 168 hours. Cardiac Enzymes: No results for input(s): CKTOTAL, CKMB, CKMBINDEX, TROPONINI in the last 168 hours. BNP (last 3 results) No results for input(s): PROBNP in the last 8760 hours. HbA1C: No results for input(s): HGBA1C in the last 72 hours. CBG: No results for input(s): GLUCAP in the last 168 hours. Lipid Profile: No results for input(s): CHOL, HDL, LDLCALC, TRIG, CHOLHDL, LDLDIRECT in the last 72 hours. Thyroid Function Tests: No results for input(s): TSH, T4TOTAL, FREET4, T3FREE, THYROIDAB in the last 72 hours. Anemia Panel: No results for input(s): VITAMINB12, FOLATE, FERRITIN, TIBC, IRON, RETICCTPCT in the last 72 hours. Sepsis Labs: No results for input(s): PROCALCITON, LATICACIDVEN in the last 168 hours.  Recent Results (from the past 240 hour(s))  Resp Panel by RT PCR (RSV, Flu A&B, Covid) - Nasopharyngeal Swab     Status: None   Collection Time: 09/23/20  8:01 PM   Specimen: Nasopharyngeal Swab  Result Value Ref Range Status    SARS Coronavirus 2 by RT PCR NEGATIVE NEGATIVE Final    Comment: (NOTE) SARS-CoV-2 target nucleic acids are NOT DETECTED.  The SARS-CoV-2 RNA is generally detectable in upper respiratoy specimens during the acute phase of infection. The lowest concentration of SARS-CoV-2 viral copies  this assay can detect is 131 copies/mL. A negative result does not preclude SARS-Cov-2 infection and should not be used as the sole basis for treatment or other patient management decisions. A negative result may occur with  improper specimen collection/handling, submission of specimen other than nasopharyngeal swab, presence of viral mutation(s) within the areas targeted by this assay, and inadequate number of viral copies (<131 copies/mL). A negative result must be combined with clinical observations, patient history, and epidemiological information. The expected result is Negative.  Fact Sheet for Patients:  https://www.moore.com/https://www.fda.gov/media/142436/download  Fact Sheet for Healthcare Providers:  https://www.young.biz/https://www.fda.gov/media/142435/download  This test is no t yet approved or cleared by the [place] FDA and  has been authorized for detection and/or diagnosis of SARS-CoV-2 by FDA under an Emergency Use Authorization (EUA). This EUA will remain  in effect (meaning this test can be used) for the duration of the COVID-19 declaration under Section 564(b)(1) of the Act, 21 U.S.C. section 360bbb-3(b)(1), unless the authorization is terminated or revoked sooner.     Influenza A by PCR NEGATIVE NEGATIVE Final   Influenza B by PCR NEGATIVE NEGATIVE Final    Comment: (NOTE) The Xpert Xpress SARS-CoV-2/FLU/RSV assay is intended as an aid in  the diagnosis of influenza from Nasopharyngeal swab specimens and  should not be used as a sole basis for treatment. Nasal washings and  aspirates are unacceptable for Xpert Xpress SARS-CoV-2/FLU/RSV  testing.  Fact Sheet for  Patients: https://www.moore.com/https://www.fda.gov/media/142436/download  Fact Sheet for Healthcare Providers: https://www.young.biz/https://www.fda.gov/media/142435/download  This test is not yet approved or cleared by the [place] FDA and  has been authorized for detection and/or diagnosis of SARS-CoV-2 by  FDA under an Emergency Use Authorization (EUA). This EUA will remain  in effect (meaning this test can be used) for the duration of the  Covid-19 declaration under Section 564(b)(1) of the Act, 21  U.S.C. section 360bbb-3(b)(1), unless the authorization is  terminated or revoked.    Respiratory Syncytial Virus by PCR NEGATIVE NEGATIVE Final    Comment: (NOTE) Fact Sheet for Patients: https://www.moore.com/https://www.fda.gov/media/142436/download  Fact Sheet for Healthcare Providers: https://www.young.biz/https://www.fda.gov/media/142435/download  This test is not yet approved or cleared by the [place] FDA and  has been authorized for detection and/or diagnosis of SARS-CoV-2 by  FDA under an Emergency Use Authorization (EUA). This EUA will remain  in effect (meaning this test can be used) for the duration of the  COVID-19 declaration under Section 564(b)(1) of the Act, 21 U.S.C.  section 360bbb-3(b)(1), unless the authorization is terminated or  revoked. Performed at [facility], [address] [place], [place] [address]   Culture, Urine     Status: None   Collection Time: 09/24/20  2:00 PM   Specimen: Urine, Random  Result Value Ref Range Status   Specimen Description   Final    URINE, RANDOM Performed at [facility], [address] [place], [place] [address]    Special Requests   Final    NONE Performed at [facility], [address] [place], [place] [address]    Culture   Final    NO GROWTH Performed at [facility], [address] [place], [place] [address]    Report Status 09/25/2020 FINAL  Final  Body fluid culture     Status: None (Preliminary result)    Collection Time: 09/25/20 11:24 AM   Specimen: PATH Cytology Pleural fluid  Result Value Ref Range Status   Specimen Description   Final  PLEURAL LT Performed at [facility], [address] [place], [place] [address]    Special Requests   Final    NONE Performed at [facility], [address], [place], [place] [address]    Gram Stain   Final    FEW WBC PRESENT, PREDOMINANTLY MONONUCLEAR NO ORGANISMS SEEN    Culture   Final    NO GROWTH 2 DAYS Performed at [facility], [address], [place], [place] [address]    Report Status PENDING  Incomplete     RN Pressure Injury Documentation:     Estimated body mass index is 31.05 kg/m as calculated from the following:   Height as of this encounter: 6\' 2"  (1.88 m).   Weight as of this encounter: 109.7 kg.  Malnutrition Type: Nutrition Problem: Inadequate oral intake Etiology: poor appetite, other (see comment) Malnutrition Characteristics: Signs/Symptoms: per patient/family report Nutrition Interventions: Interventions: Ensure Enlive (each supplement provides 350kcal and 20 grams of protein), MVI  Radiology Studies: DG CHEST PORT 1 VIEW  Result Date: 09/26/2020 CLINICAL DATA:  Shortness of breath, weakness EXAM: PORTABLE CHEST 1 VIEW COMPARISON:  09/26/2020 FINDINGS: Prior median sternotomy and CABG. Heart is borderline in size. Left basilar atelectasis or infiltrate and small left effusion with slight improvement since prior study. Right lung clear. No acute bony abnormality. IMPRESSION: Continued small left pleural effusion with left base atelectasis or infiltrate with some improvement in aeration since prior study. Electronically Signed   By: Charlett Nose M.D.   On: 09/26/2020 19:05   DG CHEST PORT 1 VIEW  Result Date: 09/26/2020 CLINICAL DATA:  Shortness of breath. EXAM: PORTABLE CHEST 1 VIEW COMPARISON:  09/25/2020. FINDINGS: Prior CABG. Left atrial appendage clip in stable  position. Stable cardiomegaly. No pulmonary venous congestion. Calcified pulmonary nodules again noted consistent prior granulomas disease. Bibasilar interstitial prominence noted on today's exam. Persistent left-sided pleural effusion. Findings suggest CHF. Pneumonitis cannot be excluded. No pneumothorax. IMPRESSION: Prior CABG. Left atrial appendage clip in stable position. Stable cardiomegaly. Bibasilar interstitial prominence noted on today's exam. Persistent left-sided pleural effusion. Findings suggest CHF. Electronically Signed   By: Maisie Fus  Register   On: 09/26/2020 06:52   ECHOCARDIOGRAM LIMITED  Result Date: 09/27/2020    ECHOCARDIOGRAM LIMITED REPORT   Patient Name:   LEDARIUS LEESON Date of Exam: 09/27/2020 Medical Rec #:  [id number]      Height:       74.0 in Accession #:    [id number]     Weight:       241.8 lb Date of Birth:  1951/05/01      BSA:          2.356 m Patient Age:    69 years       BP:           121/72 mmHg Patient Gender: M              HR:           96 bpm. Exam Location:  Inpatient Procedure: Limited Echo and Color Doppler Indications:     R06.9 DOE, CAD Native Vessel i25.0  History:         Patient has prior history of Echocardiogram examinations, most                  recent 07/30/2020. Prior CABG, Arrythmias:Atrial Fibrillation                  and LBBB;  Risk Factors:Hypertension and Dyslipidemia.  Sonographer:     Irving Burton Senior RDCS Referring Phys:  [id number] Tessa Lerner Diagnosing Phys: Truett Mainland MD  Sonographer Comments: No IV access for definity at time of study. IMPRESSIONS  1. Left ventricular ejection fraction, by estimation, is 45 to 50%, although evaluation limited due to poor visualization of the endocardium. The left ventricle has mildly decreased function. The left ventricle demonstrates regional wall motion abnormalities (see scoring diagram/findings for description). Left ventricular diastolic parameters are indeterminate.  2. Right ventricular systolic function  is normal. The right ventricular size is normal.  3. Left atrial size was mildly dilated.  4. Right atrial size was mildly dilated.  5. Normal right atrial pressure.  6. Compared to previous study on 07/24/2020, post-op changes are new. FINDINGS  Left Ventricle: Left ventricular ejection fraction, by estimation, is 45 to 50%. The left ventricle has mildly decreased function. The left ventricle demonstrates regional wall motion abnormalities. Abnormal (paradoxical) septal motion consistent with post-operative status. Left ventricular diastolic parameters are indeterminate. Right Ventricle: The right ventricular size is normal. No increase in right ventricular wall thickness. Right ventricular systolic function is normal. Left Atrium: Left atrial size was mildly dilated. Right Atrium: Right atrial size was mildly dilated. Pericardium: There is no evidence of pericardial effusion. Mitral Valve: Mild mitral annular calcification. Tricuspid Valve: The tricuspid valve is grossly normal. Tricuspid valve regurgitation is not demonstrated. Aortic Valve: The aortic valve is grossly normal. Aortic valve regurgitation is not visualized. Aorta: The aortic root and ascending aorta are structurally normal, with no evidence of dilitation. Venous: The inferior vena cava was not well visualized. IAS/Shunts: No atrial level shunt detected by color flow Doppler. Truett Mainland MD Electronically signed by Truett Mainland MD Signature Date/Time: 09/27/2020/12:00:22 PM    Final    Scheduled Meds:  bumetanide (BUMEX) IV  1 mg Intravenous Q12H   feeding supplement  237 mL Oral BID BM   magnesium oxide  200 mg Oral BID   metoprolol succinate  12.5 mg Oral q AM   multivitamin with minerals  1 tablet Oral Daily   potassium chloride  20 mEq Oral Daily   rivaroxaban  20 mg Oral Q supper   tamsulosin  0.4 mg Oral Daily   Continuous Infusions:  famotidine (PEPCID) IV 20 mg (09/27/20 1420)    LOS: 3 days    Merlene Laughter, DO [facility] PAGER is on AMION  If 7PM-7AM, please contact night-coverage [email or web address]

## 2020-09-27 NOTE — Progress Notes (Addendum)
Subjective:  Feels better Breathing and leg swelling improved Still has some lightheadedness when he stands up and walks  No more NSVT overnight  Objective:  Vital Signs in the last 24 hours: Temp:  [97.5 F (36.4 C)-97.9 F (36.6 C)] 97.5 F (36.4 C) (11/20 0447) Pulse Rate:  [78-95] 79 (11/20 1031) Resp:  [16-21] 21 (11/20 1031) BP: (112-121)/(59-72) 116/68 (11/20 1031) SpO2:  [95 %-100 %] 100 % (11/20 0447)  Intake/Output from previous day: 11/19 0701 - 11/20 0700 In: 797 [P.O.:797] Out: 1602 [Urine:1600; Drains:1; Stool:1]  Physical Exam Vitals and nursing note reviewed.  Constitutional:      General: He is not in acute distress.    Appearance: He is well-developed.  HENT:     Head: Normocephalic and atraumatic.  Eyes:     Conjunctiva/sclera: Conjunctivae normal.     Pupils: Pupils are equal, round, and reactive to light.  Neck:     Vascular: No JVD.  Cardiovascular:     Rate and Rhythm: Normal rate. Rhythm irregular.     Pulses: Normal pulses and intact distal pulses.     Heart sounds: No murmur heard.   Pulmonary:     Effort: Pulmonary effort is normal.     Breath sounds: Normal breath sounds. No wheezing or rales.     Comments: Sternotomy scar Abdominal:     General: Bowel sounds are normal.     Palpations: Abdomen is soft.     Tenderness: There is no rebound.  Musculoskeletal:        General: No tenderness. Normal range of motion.     Right lower leg: No edema.     Left lower leg: No edema.  Lymphadenopathy:     Cervical: No cervical adenopathy.  Skin:    General: Skin is warm and dry.  Neurological:     Mental Status: He is alert and oriented to person, place, and time.     Cranial Nerves: No cranial nerve deficit.      Lab Results: BMP Recent Labs    08/03/20 0104 08/03/20 0104 08/05/20 0132 08/05/20 0132 08/12/20 7829 09/04/20 1816 09/25/20 0420 09/26/20 0543 09/27/20 0509  NA 134*   < > 133*  --  140   < > 136 133* 135  K 3.4*    < > 3.7   < > 4.8   < > 3.9 3.5 3.6  CL 98   < > 96*   < > 106   < > 102 100 96*  CO2 25   < > 28   < > 21   < > 26 27 27   GLUCOSE 105*   < > 107*  --  97   < > 83 89 108*  BUN 23   < > 15  --  18   < > 5* <5* 8  CREATININE 1.31*   < > 1.24   < > 0.94   < > 1.02 0.95 1.06  CALCIUM 8.3*   < > 8.1*   < > 9.3   < > 7.9* 7.9* 8.0*  GFRNONAA 56*   < > 60*   < > 84   < > >60 >60 >60  GFRAA >60  --  >60  --  97  --   --   --   --    < > = values in this interval not displayed.    CBC Recent Labs  Lab 09/27/20 0509  WBC 9.7  RBC 4.11*  HGB  11.5*  HCT 38.2*  PLT 289  MCV 92.9  MCH 28.0  MCHC 30.1  RDW 17.2*  LYMPHSABS 1.4  MONOABS 1.0  EOSABS 1.4*  BASOSABS 0.1    HEMOGLOBIN A1C Lab Results  Component Value Date   HGBA1C 5.2 07/26/2020   MPG 102.54 07/26/2020    Cardiac Panel (last 3 results) Recent Labs    07/24/20 0210  CKTOTAL 39*    BNP (last 3 results) Recent Labs    07/24/20 0748 09/27/20 0509  BNP 206.5* 151.4*    TSH Recent Labs    07/02/20 1604 07/24/20 0748 09/16/20 0506  TSH 1.898 0.941 1.083    Lipid Panel     Component Value Date/Time   CHOL 78 07/26/2020 0515   CHOL 143 07/10/2019 0906   TRIG 92 07/26/2020 0515   HDL 24 (L) 07/26/2020 0515   HDL 51 07/10/2019 0906   CHOLHDL 3.3 07/26/2020 0515   VLDL 18 07/26/2020 0515   LDLCALC 36 07/26/2020 0515   LDLCALC 74 07/10/2019 0906     Hepatic Function Panel Recent Labs    09/15/20 0441 09/16/20 0506 09/25/20 0420 09/26/20 0543 09/27/20 0509  PROT 5.9*   < > 5.2* 5.1* 5.5*  ALBUMIN 2.4*   < > 2.1* 2.0* 2.3*  AST 61*   < > 18 20 25   ALT 61*   < > 16 16 17   ALKPHOS 690*   < > 150* 153* 165*  BILITOT 1.6*   < > 1.1 1.0 0.9  BILIDIR 0.4*  --   --   --   --    < > = values in this interval not displayed.     Cardiac Studies:  EKG 09/14/2020: Atrial fibrillation 89 bpm LBBB  CABGX4 07/29/2020: (Dr. 13/05/2020) LIMA-LAD, RIMA-PDA, left radial-OM/ramus Bi-atrial Maze procedure  and left atrial appendage clipping  Echocardiogram 09/27/2020: 1. Left ventricular ejection fraction, by estimation, is 45 to 50%,  although evaluation limited due to poor visualization of the endocardium.  The left ventricle has mildly decreased function. The left ventricle  demonstrates regional wall motion  abnormalities (see scoring diagram/findings for description). Left  ventricular diastolic parameters are indeterminate.  2. Right ventricular systolic function is normal. The right ventricular  size is normal.  3. Left atrial size was mildly dilated.  4. Right atrial size was mildly dilated.  5. Normal right atrial pressure.  6. Compared to previous study on 07/24/2020, post-op changes are new.   Heart Catheterization 07/25/20: LM: Distal 30-40% stenosis. LAD: LAD diffusely diseased, proximal diffuse 80% followed by tandem 90% stenosis. Large D1 with moderate diffuse disease and proximal and mid tandem 70 and 80% stenosis. Has secondary branches and is tortuous. Cx and RI: Co-dominant Cx with Ostial circumflex 99% involving a moderate sized ramus with ostial 80 to 90% and proximal 90% stenosis. RCA: Proximal RCA 80% stenosis, large vessel with mild disease in PL and PDA branches. A secondary PL branch is subtotally occluded. LV: Normal LVEDP. No pressure gradient across the aortic valve. Patent LIMA and RIMA and RIMA has ostial 20-30% stenosis. Subclavian arteries widely patent.  Right radial diffusely disease by 07/26/2020. 61mL contrast used.   Carotid duplex 07/27/2020:  Right Carotid: Velocities in the right ICA are consistent with a 1-39% stenosis.  Left Carotid: Velocities in the left ICA are consistent with a 60-79% stenosis.  Vertebrals: Bilateral vertebral arteries demonstrate antegrade flow.    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 Hypoalbuminemia also contributing to third spacing and edema. I do not think he is in florid heart failure at this time. Okay to switch IV bumex to PO lasix 40 mg daily  CAD: S/p CABG No angina On metoprolol xL 12.5 mg daily, staitn Not on Aspirin due to ongoing use of Xarelto for Afib  Persistent Afib: Rate controlled. High CHA2DS2Vasc score. Continue Xarelto 20 mg faily If orthostatic lightheadedness persists, could reduce/stop metoprolol.   Cardiology signing off. Please call back, if any questions arise.     [name], MD Pager: [phone] Office: [phone]

## 2020-09-27 NOTE — Progress Notes (Signed)
Echocardiogram 2D Echocardiogram has been performed.  [name] Rice 09/27/2020, 10:07 AM   Attempted to flush IV before administering definity and it appeared infiltrated. Nurse confirmed and removed IV. Did not administer definity due to no IV access, will come back to add later if recommended by Dr. Odis Hollingshead.

## 2020-09-27 NOTE — Progress Notes (Addendum)
Worsening rash tonight 11/20. Rash on both legs, stomach, hip and neck. Only other new meds would be protonix and florastor.  Jp drain not putting out fluid. Appears swollen and red under dressing close to insertion site.

## 2020-09-28 ENCOUNTER — Inpatient Hospital Stay (HOSPITAL_COMMUNITY): Payer: Medicare Other

## 2020-09-28 DIAGNOSIS — I951 Orthostatic hypotension: Secondary | ICD-10-CM

## 2020-09-28 DIAGNOSIS — I10 Essential (primary) hypertension: Secondary | ICD-10-CM | POA: Diagnosis not present

## 2020-09-28 DIAGNOSIS — D62 Acute posthemorrhagic anemia: Secondary | ICD-10-CM | POA: Diagnosis not present

## 2020-09-28 DIAGNOSIS — I4811 Longstanding persistent atrial fibrillation: Secondary | ICD-10-CM | POA: Diagnosis not present

## 2020-09-28 DIAGNOSIS — R112 Nausea with vomiting, unspecified: Secondary | ICD-10-CM | POA: Diagnosis not present

## 2020-09-28 LAB — CBC WITH DIFFERENTIAL/PLATELET
Abs Immature Granulocytes: 0.1 10*3/uL — ABNORMAL HIGH (ref 0.00–0.07)
Basophils Absolute: 0.1 10*3/uL (ref 0.0–0.1)
Basophils Relative: 1 %
Eosinophils Absolute: 1.4 10*3/uL — ABNORMAL HIGH (ref 0.0–0.5)
Eosinophils Relative: 16 %
HCT: 34.1 % — ABNORMAL LOW (ref 39.0–52.0)
Hemoglobin: 10.5 g/dL — ABNORMAL LOW (ref 13.0–17.0)
Immature Granulocytes: 1 %
Lymphocytes Relative: 18 %
Lymphs Abs: 1.6 10*3/uL (ref 0.7–4.0)
MCH: 28.5 pg (ref 26.0–34.0)
MCHC: 30.8 g/dL (ref 30.0–36.0)
MCV: 92.7 fL (ref 80.0–100.0)
Monocytes Absolute: 0.9 10*3/uL (ref 0.1–1.0)
Monocytes Relative: 10 %
Neutro Abs: 4.9 10*3/uL (ref 1.7–7.7)
Neutrophils Relative %: 54 %
Platelets: 292 10*3/uL (ref 150–400)
RBC: 3.68 MIL/uL — ABNORMAL LOW (ref 4.22–5.81)
RDW: 17.4 % — ABNORMAL HIGH (ref 11.5–15.5)
WBC: 9 10*3/uL (ref 4.0–10.5)
nRBC: 0 % (ref 0.0–0.2)

## 2020-09-28 LAB — COMPREHENSIVE METABOLIC PANEL
ALT: 19 U/L (ref 0–44)
AST: 29 U/L (ref 15–41)
Albumin: 2.1 g/dL — ABNORMAL LOW (ref 3.5–5.0)
Alkaline Phosphatase: 152 U/L — ABNORMAL HIGH (ref 38–126)
Anion gap: 10 (ref 5–15)
BUN: 8 mg/dL (ref 8–23)
CO2: 32 mmol/L (ref 22–32)
Calcium: 7.9 mg/dL — ABNORMAL LOW (ref 8.9–10.3)
Chloride: 95 mmol/L — ABNORMAL LOW (ref 98–111)
Creatinine, Ser: 1.21 mg/dL (ref 0.61–1.24)
GFR, Estimated: >60 mL/min (ref >60)
Glucose, Bld: 93 mg/dL (ref 70–99)
Potassium: 3.5 mmol/L (ref 3.5–5.1)
Sodium: 137 mmol/L (ref 135–145)
Total Bilirubin: 0.7 mg/dL (ref 0.3–1.2)
Total Protein: 5.4 g/dL — ABNORMAL LOW (ref 6.5–8.1)

## 2020-09-28 LAB — MAGNESIUM: Magnesium: 2 mg/dL (ref 1.7–2.4)

## 2020-09-28 LAB — BODY FLUID CULTURE: Culture: NO GROWTH

## 2020-09-28 LAB — PHOSPHORUS: Phosphorus: 3.2 mg/dL (ref 2.5–4.6)

## 2020-09-28 MED ORDER — FUROSEMIDE 40 MG PO TABS
40.0000 mg | ORAL_TABLET | Freq: Every day | ORAL | Status: DC
  Filled 2020-09-28: qty 1

## 2020-09-28 MED ORDER — SODIUM CHLORIDE 0.9 % IV BOLUS
250.0000 mL | Freq: Once | INTRAVENOUS | Status: AC
  Administered 2020-09-28: 250 mL via INTRAVENOUS

## 2020-09-28 MED ORDER — FAMOTIDINE 20 MG PO TABS
20.0000 mg | ORAL_TABLET | Freq: Two times a day (BID) | ORAL | Status: DC
  Administered 2020-09-28 – 2020-10-01 (×6): 20 mg via ORAL
  Filled 2020-09-28 (×6): qty 1

## 2020-09-28 MED ORDER — ROSUVASTATIN CALCIUM 20 MG PO TABS
40.0000 mg | ORAL_TABLET | Freq: Every day | ORAL | Status: DC
  Administered 2020-09-28 – 2020-09-30 (×3): 40 mg via ORAL
  Filled 2020-09-28 (×3): qty 2

## 2020-09-28 NOTE — Progress Notes (Signed)
PROGRESS NOTE    [name]  ZDG:[id number] DOB: 1951-05-12 DOA: 09/23/2020 PCP: [name], PA-C   Brief Narrative:  HPI per [name] on 09/23/20 FINNEAS MATHE is a 69 y.o. male with medical history significant of chronic atrial fibrillation, CAD, s/p CABG almost 2 months ago, LBBB, chronic systolic heart failure of 45 to 50% per recent echocardiogram who was recently admitted and discharged from 09/14/2020 to 09/22/2020 for acute cholecystitis requiring ERCP with successful extraction of a stone, followed by partial laparoscopic cholecystectomy with JP drainage placement who returns to the hospital due to generalized weakness associated with decreased oral intake, abdominal pain, nausea, 2 episodes of emesis and a history of a mechanical fall at home without any significant injury. He states his diarrhea has gotten better in the past few days. He denies fever, but complains of chills and fatigue. He denies rhinorrhea, sore throat, wheezing or hemoptysis. No chest pain, palpitations, diaphoresis, PND or orthopnea. He gets occasional lower extremity edema, but states that this has improved significantly. He denies constipation, melena or hematochezia. He has had mild dysuria along with right flank pain, but no frequency, oliguria or hematuria. No polyuria, polydipsia, polyphagia or blurred vision.  ED Course: Initial vital signs were temperature 97.8 F, pulse 86, respirations 16, blood pressure 143/99 mmHg O2 sat 98% on room air.  Labs: Coronavirus and influenza PCR was negative.  Urine analysis showed large hemoglobinuria with more than 50 RBC microscopic examination.  Ketonuria 5 and proteinuria 30 mg/dL.  There was trace leukocyte esterase with 21-50 WBCs per hpf.  There were a few bacteria, mucus, but engaged and hyaline casts.  Imaging: 1 view chest radiograph shows enlarging left pleural effusion likely reflecting passive atelectasis though underlying infection or edema  could not be fully excluded.  There is vascular congestion and grading opacity in the right lung which is favored to be a combination of atelectasis and edema.  There is trace right pleural effusion as well.  CT abdomen/pelvis with contrast shows status post cholecystectomy with ill-defined fluid and edema in the operative bed.  Considerations include phlegmon, developing abscess, biloma and postoperative hematoma.  Development of right-sided urinary tract obstruction secondary to a punctate mid right ureteric stone treated close to left-sided stone at the ureteropelvic junction without significant hydronephrosis.  There is atelectasis.  There is fat and right side of the bladder within an inguinal hernia.  Please see images and full radiology report.  **Interim History  His diet was advanced to clear liquid diet and will go to soft diet.  Dietitian was consulted for further evaluation we will add probiotics.  Patient states that he feels generally weak but he is improving.  Further work-up done and he had a thoracentesis today which yielded 1.85 L and he was given a dose of IV Lasix for diuresis.  General surgery does not feel he has a bile leak and they are recommending continuing drain output.  PT OT to reevaluate today and urology evaluation still pending but Dr. [name] recommended starting the patient on tamsulosin and having follow-up in outpatient setting as he did not feel like any surgical intervention is warranted for his ureteral stone.  Patient was given a dose of IV Lasix on 09/25/2020 and will be given another dose today given his volume overload.  Likely has acute on chronic diastolic CHF.  Cardiology is consulted for further evaluation recommendations.  His antibiotics have now stopped and patient has a rash that has developed  we will provide Benadryl.  And monitor off of antibiotics.  We will also stop antifungals given his negative growth and urine culture.  On 09/26/2020 cardiology was  consulted and changed the patient to IV Bumex 1 mg twice daily and then today has been changed to p.o. Lasix 40 mg daily.  Cardiology feels that his shortness of breath is multifactorial and is improving.  Patient continues to have some lightheadedness and cardiology recommending possibly stopping or reducing the metoprolol.  Patient's rash overnight got little bit worse so we will start him on H2 blocker blocker  09/28/20: Patient's rash is improving on famotidine but today it looks like he is over diuresed as he would not change from his IV Bumex to p.o. Lasix.  I have discontinued IV Bumex and his blood pressure was optimal.  Underwent orthostatic bowel sounds and was orthostatic and dizzy.  We will hold his metoprolol and his Lasix and give him a small to 50 bolus and have ordered him a abdominal binder.  Assessment & Plan:   Principal Problem:   Nausea and vomiting Active Problems:   Atrial fibrillation (HCC)   Pure hypercholesterolemia   Malnutrition of moderate degree (HCC)   Acute blood loss anemia   CAD, multiple vessel   Benign hypertension   Right sided hydronephrosis with renal and ureteral calculus obstruction   Budding yeast in U/A detected   Generalized weakness  Mild Right sided hydronephrosis with renal and ureteral calculus obstruction and ? UTI poA -Continue IV hydration and reduce the Rate to 75 mL/hr; Has development of Right Sideded Urinary Tract Obstruction due to Punctate Mid Ureteric Stone -Tamsulosin 0.4 mg p.o. twice daily and will change to Daily  -Discussed with Urology Dr. Gwynneth Macleod and he will evaluate eventually  -Leukocytosis on admission of 11.6 which is now improved to 9.2 yesterday but could have been in the setting of continued dehydration: WBC is now 9.7 -patient's urinalysis showed a hazy appearance with large hemoglobin, 5 ketones, trace leukocytes, negative nitrites, few bacteria, greater than 50 RBCs per high-power field, and 21-50 WBCs; urine  culture still pending but was obtained after he is placed on antibiotics; urine culture showed no growth so we will stop antibiotics -Patient is also started on p.o. fluconazole and will now discontinue -Antibiotics have now been discontinued and general surgery feels that he does not need more antibiotics from a gallbladder standpoint; patient did develop a rash likely in the setting of his antibiotics-we will need to continue to monitor and watch off of antibiotics  Generalized weakness and poor p.o. intake -Was In the setting of dehydration and poor p.o. intake and recent gallbladder issues and possible UTI -IV fluid hydration is now stopped and he had a worsened left-sided pleural effusion.  He was given a dose of IV Lasix and had a thoracentesis.  Cardiology evaluated and changed his Lasix to IV Bumex.  IV Bumex was supposed to be changed to p.o. Lasix yesterday but unfortunately did not get done and IV Bumex was given yesterday.  This was discontinued and changed to p.o. Lasix this morning however patient's blood pressure was on the low side so p.o. Lasix was held -PT OT to further evaluate and treat and recommending Home Health for now but will need reevaluation -Recommend OOB to Chair -Patient was orthostatic today so we will hold his metoprolol and his furosemide and order a 250 mL bolus and order abdominal binder and TED hose  Budding yeast in U/A detected -Will treat  given symptoms and possible need for urological procedure; urology feels that no surgical intervention is warranted at this time but they will come evaluate the patient later on -Fluconazole 150 mg p.o. daily changed to 200 p.o. daily now and stop  Fluid and Edema in the Operative Bed -? Phlegmon/Developing Abscess, Biloma, Postoperative Hematoma -Will discuss with General Surgery and they do not feel that he has an abscess and feel that the findings were a posterior wall of his gallbladder -They recommend no Follow up and  antibiotics now been stopped  Atrial fibrillation, chronic (HCC) -CHA?DS?-VASc Score of at least 4. -S/p Maze Procedure on 07/29/20 -Continued metoprolol 12.5 mg p.o. daily I have now discontinued given his orthostatics -Continue Xarelto for anticoagulation. -Continue to monitor on telemetry  Acquired Thrombophilia -Think of his atrial fibrillation -Patient's CHA2DS2-VASc is 4 -Continue with anticoagulation with Xarelto  Pure Hypercholesterolemia -Hold Rosuvastatin while on fluconazole and resume now that fluconazole is being discontinued  Malnutrition of moderate degree (HCC) -Dietitian was consulted for further evaluation recommendations -They are recommending Ensure Enlive p.o. twice daily -Continue with multivitamin with minerals daily -Diet has been advanced from a clear liquid diet to soft -We will add a probiotic as well  Normocytic Anemia -In the setting of 2 recent surgeries. -Hemoglobin higher than baseline and is slowly trending down as hemoglobin/hematocrit went from 12.0/39.2 -> 11.3/36.2 -> 10.0/33.2 -> 10.7/35.6 -> 11.5/38.2 -Continue monitor hemoglobin/hematocrit and monitor for signs and symptoms of bleeding as he is anticoagulated; currently no overt bleeding noted -Repeat CBC in a.m.  CAD, multiple vessel -Continue beta-blocker and Xarelto. -Holding a statin while using fluconazole. -Resume Statin now  Benign Hypertension -> Mild Hypotension -He has been holding amlodipine and olmesartan. -Continued Metoprolol 12.5 mg p.o. daily but stopped now -Monitor blood pressure and heart rate. -Blood pressures on the lower side at 106/60 -Continue monitor blood pressure per protocol  Orthostatic Hypotension  -Was lightheaded and Dizzy upon standing -BP was on the lower side at 92/61 -Give a 250 mL bolus -Hold Metoprol and Lasix -Abdominal Binder and TED Hose -Repeat Orthostatic VS in the AM  -C/w PT/OT efforts   Left-Sided Pleural Effusion, poA and  improved -He was given a dose of IV Lasix yesterday and will be given 1 today as he was +2.3 L since admission; Now Patient is +199 mL -Ordered a thoracentesis to be done and he had a left-sided thoracentesis which yielded 1.85 L of fluid -Ordered a Fluid analysis on the pleural fluid and showed a red color with 1109 total nucleated cell count, 50 lymphs, turbid appearance, 43 monocytes, with Gram stain WBCs and no organisms seen currently -Repeat chest x-ray showed "Small left effusion with underlying opacity, likely atelectasis. This finding is unchanged. No other changes" -Will add Flutter Valve and Incentive Spirometry   Acute on Chronic Diastolic CHF  -Chest x-ray a few days ago showed "Prior CABG. Left atrial appendage clip in stable position. Stable cardiomegaly. Bibasilar interstitial prominence noted on today's exam. Persistent left-sided pleural effusion. Findings suggest CHF." -CXR from today as above -Given a dose of IV diuresis yesterday and will be given another today pending his blood pressure next -strict I's and O's, daily weights -BNP 151.4 -Repeat ECHO done and showed "Left Ventricle: Left ventricular ejection fraction, by estimation, is 45 to 50%. The left ventricle has mildly decreased function. The left ventricle demonstrates regional wall motion abnormalities. Abnormal (paradoxical) septal motion consistent with post-operative status. Left ventricular diastolic parameters are indeterminate."  -We will  no longer fluid restrict to 1500 mL -IV Lasix changed to IV Bumex and now changing to po Lasix 40 mg daily but held due to [facility]  -Cardiology is consulted for further evaluation recommendations and recommending holding Lasix and Metoprolol  -Continue to monitor for signs and symptoms of volume overload and repeat chest x-ray in a.m. -Follow-up on cardiology recommendations and will repeat a CMP in the AM   Rash, Improving  -Diffuse whole body rash could be from contact  dermatitis or medication induced -Have stopped antibiotics, PPI, and Probiotics -Provide the patient with Benadryl cream -Will start H2 Blocker BID IV and if still persists consider steroids; Rash is improving and will hold off on Steroids  -Likely from Zosyn  Hyponatremia -Likely hypervolemic hyponatremia; Improved and Na+ is 135 -Continue with diuresis as tolerated -Repeat CMP in a.m.   Obesity -Complicates overall prognosis and Care -Estimated body mass index is 31.05 kg/m as calculated from the following:   Height as of this encounter: 6\' 2"  (1.88 m).   Weight as of this encounter: 109.7 kg. -Weight Loss and Dietary Counseling given   DVT prophylaxis: Anticoagulated with Xarelto Code Status: FULL CODE  Family Communication: No family present at bedside Disposition Plan: Pending further clinical improvement and tolerance of p.o. diet and evaluation by PT and OT; Will need Repeat Orthostatics in the AM as he was orthostatic today  Status is: Inpatient  Remains inpatient appropriate because:Unsafe d/c plan, IV treatments appropriate due to intensity of illness or inability to take PO and Inpatient level of care appropriate due to severity of illness   Dispo: The patient is from: Home              Anticipated d/c is to: Home vs SNF               Anticipated d/c date is: 1-2 days              Patient currently is not medically stable to d/c.  Consultants:   Urology  General Surgery   Cardiology  IR for Thoracentesis   Procedures: Successful image-guided left thoracentesis. Yielded 1.85 liters of dark red fluid. Procedure was stopped after 1.85 L secondary to patient's BP (78/40, repeat 85/49)- at that time, patient stated he was lightheaded with blurred vision. He was placed in RT with improvement of BP (92/51, repeat 106/56) and resolution of symptoms. Patient tolerated procedure well. No immediate complications. EBL < 1 mL.  Antimicrobials:  Anti-infectives (From  admission, onward)   Start     Dose/Rate Route Frequency Ordered Stop   09/26/20 1000  fluconazole (DIFLUCAN) tablet 200 mg  Status:  Discontinued        200 mg Oral Daily 09/25/20 1355 09/26/20 1728   09/24/20 0800  piperacillin-tazobactam (ZOSYN) IVPB 3.375 g  Status:  Discontinued        3.375 g 12.5 mL/hr over 240 Minutes Intravenous Every 8 hours 09/24/20 0030 09/26/20 1151   09/24/20 0030  piperacillin-tazobactam (ZOSYN) IVPB 3.375 g        3.375 g 100 mL/hr over 30 Minutes Intravenous  Once 09/24/20 0006 09/24/20 0138   09/24/20 0015  fluconazole (DIFLUCAN) tablet 150 mg  Status:  Discontinued       Note to Pharmacy: Budding yeast urine with some urinary symptoms who will likely need to undergo cystoscopy.   150 mg Oral Daily 09/24/20 0010 09/25/20 1355        Subjective: Seen and examined at bedside he felt  okay and his rash is improving but he became dizzy upon standing on orthostatics.  No nausea or vomiting. Denies any abdominal pain and has had some itching in his Right Leg.   Objective: Vitals:   09/27/20 2239 09/28/20 0632 09/28/20 1304 09/28/20 1416  BP: 108/69 (!) 101/59 100/61 92/61  Pulse: 74 83  81  Resp: 18 20    Temp: 97.9 F (36.6 C) 97.7 F (36.5 C)  98 F (36.7 C)  TempSrc: Oral Oral  Oral  SpO2: 96% 98%  99%  Weight:      Height:        Intake/Output Summary (Last 24 hours) at 09/28/2020 1557 Last data filed at 09/28/2020 1424 Gross per 24 hour  Intake 610 ml  Output 1425 ml  Net -815 ml   Filed Weights   09/24/20 0130  Weight: 109.7 kg   Examination: Physical Exam:  Constitutional: WN/WD obese Caucasian male currently no acute distress appears calm  Eyes: Lids and conjunctivae normal, sclerae anicteric  ENMT: External Ears, Nose appear normal. Grossly normal hearing.  Neck: Appears normal, supple, no cervical masses, normal ROM, no appreciable thyromegaly; no JVD Respiratory: Diminished to auscultation bilaterally with slightly coarse  breath sounds, no wheezing, rales, rhonchi or crackles. Normal respiratory effort and patient is not tachypenic. No accessory muscle use.  Unlabored breathing Cardiovascular: RRR, no murmurs / rubs / gallops. S1 and S2 auscultated.  Has 1+ lower extremity edema Abdomen: Soft, non-tender, distended secondary to body habitus.  Has a JP drain in the right side of the abdomen bowel sounds positive.  GU: Deferred. Musculoskeletal: No clubbing / cyanosis of digits/nails. No joint deformity upper and lower extremities.  Skin: Has a diffuse rash which is improving. No induration; Warm and dry.  Neurologic: CN 2-12 grossly intact with no focal deficits. Romberg sign and cerebellar reflexes not assessed.  Psychiatric: Normal judgment and insight. Alert and oriented x 3. Normal mood and appropriate affect.   Data Reviewed: I have personally reviewed following labs and imaging studies  CBC: Recent Labs  Lab 09/23/20 1800 09/23/20 1800 09/24/20 0426 09/25/20 0420 09/26/20 0543 09/27/20 0509 09/28/20 0415  WBC 11.6*   < > 10.0 9.2 10.3 9.7 9.0  NEUTROABS 8.9*  --   --  5.2 6.6 5.8 4.9  HGB 12.0*   < > 11.3* 10.0* 10.7* 11.5* 10.5*  HCT 39.2   < > 36.2* 33.3* 35.6* 38.2* 34.1*  MCV 92.5   < > 91.6 94.1 93.4 92.9 92.7  PLT 385   < > 325 313 292 289 292   < > = values in this interval not displayed.   Basic Metabolic Panel: Recent Labs  Lab 09/24/20 0426 09/25/20 0420 09/26/20 0543 09/27/20 0509 09/28/20 0415  NA 135 136 133* 135 137  K 3.7 3.9 3.5 3.6 3.5  CL 103 102 100 96* 95*  CO2 32  GLUCOSE 88 83 89 108* 93  BUN 6* 5* <5* 8 8  CREATININE 0.87 1.02 0.95 1.06 1.21  CALCIUM 8.0* 7.9* 7.9* 8.0* 7.9*  MG  --  1.7 1.9 1.9 2.0  PHOS  --  2.8 2.8 2.9 3.2   GFR: Estimated Creatinine Clearance: 78.1 mL/min (by C-G formula based on SCr of 1.21 mg/dL). Liver Function Tests: Recent Labs  Lab 09/24/20 0426 09/25/20 0420 09/26/20 0543 09/27/20 0509 09/28/20 0415  AST ALT ALKPHOS 184* 150* 153*  165* 152*  BILITOT 1.0 1.1 1.0 0.9 0.7  PROT 5.6* 5.2* 5.1* 5.5* 5.4*  ALBUMIN 2.1* 2.1* 2.0* 2.3* 2.1*   Recent Labs  Lab 09/23/20 1800  LIPASE 44   No results for input(s): AMMONIA in the last 168 hours. Coagulation Profile: No results for input(s): INR, PROTIME in the last 168 hours. Cardiac Enzymes: No results for input(s): CKTOTAL, CKMB, CKMBINDEX, TROPONINI in the last 168 hours. BNP (last 3 results) No results for input(s): PROBNP in the last 8760 hours. HbA1C: No results for input(s): HGBA1C in the last 72 hours. CBG: No results for input(s): GLUCAP in the last 168 hours. Lipid Profile: No results for input(s): CHOL, HDL, LDLCALC, TRIG, CHOLHDL, LDLDIRECT in the last 72 hours. Thyroid Function Tests: No results for input(s): TSH, T4TOTAL, FREET4, T3FREE, THYROIDAB in the last 72 hours. Anemia Panel: No results for input(s): VITAMINB12, FOLATE, FERRITIN, TIBC, IRON, RETICCTPCT in the last 72 hours. Sepsis Labs: No results for input(s): PROCALCITON, LATICACIDVEN in the last 168 hours.  Recent Results (from the past 240 hour(s))  Resp Panel by RT PCR (RSV, Flu A&B, Covid) - Nasopharyngeal Swab     Status: None   Collection Time: 09/23/20  8:01 PM   Specimen: Nasopharyngeal Swab  Result Value Ref Range Status   SARS Coronavirus 2 by RT PCR NEGATIVE NEGATIVE Final    Comment: (NOTE) SARS-CoV-2 target nucleic acids are NOT DETECTED.  The SARS-CoV-2 RNA is generally detectable in upper respiratoy specimens during the acute phase of infection. The lowest concentration of SARS-CoV-2 viral copies this assay can detect is 131 copies/mL. A negative result does not preclude SARS-Cov-2 infection and should not be used as the sole basis for treatment or other patient management decisions. A negative result may occur with  improper specimen collection/handling, submission of specimen other than nasopharyngeal swab, presence  of viral mutation(s) within the areas targeted by this assay, and inadequate number of viral copies (<131 copies/mL). A negative result must be combined with clinical observations, patient history, and epidemiological information. The expected result is Negative.  Fact Sheet for Patients:  https://www.moore.com/  Fact Sheet for Healthcare Providers:  https://www.young.biz/  This test is no t yet approved or cleared by the [place] FDA and  has been authorized for detection and/or diagnosis of SARS-CoV-2 by FDA under an Emergency Use Authorization (EUA). This EUA will remain  in effect (meaning this test can be used) for the duration of the COVID-19 declaration under Section 564(b)(1) of the Act, 21 U.S.C. section 360bbb-3(b)(1), unless the authorization is terminated or revoked sooner.     Influenza A by PCR NEGATIVE NEGATIVE Final   Influenza B by PCR NEGATIVE NEGATIVE Final    Comment: (NOTE) The Xpert Xpress SARS-CoV-2/FLU/RSV assay is intended as an aid in  the diagnosis of influenza from Nasopharyngeal swab specimens and  should not be used as a sole basis for treatment. Nasal washings and  aspirates are unacceptable for Xpert Xpress SARS-CoV-2/FLU/RSV  testing.  Fact Sheet for Patients: https://www.moore.com/  Fact Sheet for Healthcare Providers: https://www.young.biz/  This test is not yet approved or cleared by the [place] FDA and  has been authorized for detection and/or diagnosis of SARS-CoV-2 by  FDA under an Emergency Use Authorization (EUA). This EUA will remain  in effect (meaning this test can be used) for the duration of the  Covid-19 declaration under Section 564(b)(1) of the Act, 21  U.S.C. section 360bbb-3(b)(1), unless the authorization is  terminated or revoked.  Respiratory Syncytial Virus by PCR NEGATIVE NEGATIVE Final    Comment: (NOTE) Fact Sheet for  Patients: https://www.moore.com/  Fact Sheet for Healthcare Providers: https://www.young.biz/  This test is not yet approved or cleared by the [place] FDA and  has been authorized for detection and/or diagnosis of SARS-CoV-2 by  FDA under an Emergency Use Authorization (EUA). This EUA will remain  in effect (meaning this test can be used) for the duration of the  COVID-19 declaration under Section 564(b)(1) of the Act, 21 U.S.C.  section 360bbb-3(b)(1), unless the authorization is terminated or  revoked. Performed at [facility], [address], [place], [place] [address]   Culture, Urine     Status: None   Collection Time: 09/24/20  2:00 PM   Specimen: Urine, Random  Result Value Ref Range Status   Specimen Description   Final    URINE, RANDOM Performed at [facility], [address] [place], [place] [address]    Special Requests   Final    NONE Performed at [facility], [address], [place], [place] [address]    Culture   Final    NO GROWTH Performed at [facility], [address], [place], [place] [address]    Report Status 09/25/2020 FINAL  Final  Body fluid culture     Status: None   Collection Time: 09/25/20 11:24 AM   Specimen: PATH Cytology Pleural fluid  Result Value Ref Range Status   Specimen Description   Final    PLEURAL LT Performed at [facility] [address], [place], [place] [address]    Special Requests   Final    NONE Performed at [facility], [address], [place], [place] [address]    Gram Stain   Final    FEW WBC PRESENT, PREDOMINANTLY MONONUCLEAR NO ORGANISMS SEEN    Culture   Final    NO GROWTH 3 DAYS Performed at [facility], [address], [place], [place] [address]    Report Status 09/28/2020 FINAL  Final     RN Pressure Injury Documentation:     Estimated body mass  index is 31.05 kg/m as calculated from the following:   Height as of this encounter: 6\' 2"  (1.88 m).   Weight as of this encounter: 109.7 kg.  Malnutrition Type: Nutrition Problem: Inadequate oral intake Etiology: poor appetite, other (see comment) Malnutrition Characteristics: Signs/Symptoms: per patient/family report Nutrition Interventions: Interventions: Ensure Enlive (each supplement provides 350kcal and 20 grams of protein), MVI  Radiology Studies: DG CHEST PORT 1 VIEW  Result Date: 09/28/2020 CLINICAL DATA:  Shortness of breath EXAM: PORTABLE CHEST 1 VIEW COMPARISON:  September 26, 2020 FINDINGS: Calcified granuloma in the right base, unchanged. Small left effusion with underlying atelectasis. The cardiomediastinal silhouette is stable. No pneumothorax. No other changes. IMPRESSION: Small left effusion with underlying opacity, likely atelectasis. This finding is unchanged. No other changes. Electronically Signed   By: September 28, 2020 III M.D   On: 09/28/2020 09:25   DG CHEST PORT 1 VIEW  Result Date: 09/26/2020 CLINICAL DATA:  Shortness of breath, weakness EXAM: PORTABLE CHEST 1 VIEW COMPARISON:  09/26/2020 FINDINGS: Prior median sternotomy and CABG. Heart is borderline in size. Left basilar atelectasis or infiltrate and small left effusion with slight improvement since prior study. Right lung clear. No acute bony abnormality. IMPRESSION: Continued small left pleural effusion with left base atelectasis or infiltrate with some improvement in aeration  since prior study. Electronically Signed   By: Charlett Nose M.D.   On: 09/26/2020 19:05   ECHOCARDIOGRAM LIMITED  Result Date: 09/27/2020    ECHOCARDIOGRAM LIMITED REPORT   Patient Name:   AVIYON HOCEVAR Date of Exam: 09/27/2020 Medical Rec #:  [id number]      Height:       74.0 in Accession #:    [id number]     Weight:       241.8 lb Date of Birth:  11-Mar-1951      BSA:          2.356 m Patient Age:    69 years       BP:           121/72  mmHg Patient Gender: M              HR:           96 bpm. Exam Location:  Inpatient Procedure: Limited Echo and Color Doppler Indications:     R06.9 DOE, CAD Native Vessel i25.0  History:         Patient has prior history of Echocardiogram examinations, most                  recent 07/30/2020. Prior CABG, Arrythmias:Atrial Fibrillation                  and LBBB; Risk Factors:Hypertension and Dyslipidemia.  Sonographer:     Irving Burton Senior RDCS Referring Phys:  [id number] Tessa Lerner Diagnosing Phys: Truett Mainland MD  Sonographer Comments: No IV access for definity at time of study. IMPRESSIONS  1. Left ventricular ejection fraction, by estimation, is 45 to 50%, although evaluation limited due to poor visualization of the endocardium. The left ventricle has mildly decreased function. The left ventricle demonstrates regional wall motion abnormalities (see scoring diagram/findings for description). Left ventricular diastolic parameters are indeterminate.  2. Right ventricular systolic function is normal. The right ventricular size is normal.  3. Left atrial size was mildly dilated.  4. Right atrial size was mildly dilated.  5. Normal right atrial pressure.  6. Compared to previous study on 07/24/2020, post-op changes are new. FINDINGS  Left Ventricle: Left ventricular ejection fraction, by estimation, is 45 to 50%. The left ventricle has mildly decreased function. The left ventricle demonstrates regional wall motion abnormalities. Abnormal (paradoxical) septal motion consistent with post-operative status. Left ventricular diastolic parameters are indeterminate. Right Ventricle: The right ventricular size is normal. No increase in right ventricular wall thickness. Right ventricular systolic function is normal. Left Atrium: Left atrial size was mildly dilated. Right Atrium: Right atrial size was mildly dilated. Pericardium: There is no evidence of pericardial effusion. Mitral Valve: Mild mitral annular calcification.  Tricuspid Valve: The tricuspid valve is grossly normal. Tricuspid valve regurgitation is not demonstrated. Aortic Valve: The aortic valve is grossly normal. Aortic valve regurgitation is not visualized. Aorta: The aortic root and ascending aorta are structurally normal, with no evidence of dilitation. Venous: The inferior vena cava was not well visualized. IAS/Shunts: No atrial level shunt detected by color flow Doppler. Truett Mainland MD Electronically signed by Truett Mainland MD Signature Date/Time: 09/27/2020/12:00:22 PM    Final    Scheduled Meds: . famotidine  20 mg Oral BID  . feeding supplement  237 mL Oral BID BM  . magnesium oxide  200 mg Oral BID  . multivitamin with minerals  1 tablet Oral Daily  . potassium chloride  20 mEq Oral Daily  .  rivaroxaban  20 mg Oral Q supper  . tamsulosin  0.4 mg Oral Daily   Continuous Infusions: . sodium chloride      LOS: 4 days    Merlene Laughter, DO [facility] PAGER is on AMION  If 7PM-7AM, please contact night-coverage [email or web address]

## 2020-09-28 NOTE — Plan of Care (Signed)

## 2020-09-28 NOTE — Progress Notes (Signed)
Pt states that he feels dizzy. BP 92/61 HR 81. MD notified. Will continue to monitor.

## 2020-09-28 NOTE — Progress Notes (Signed)
OT Cancellation Note  Patient Details Name: [name] Rice MRN: [id number] DOB: Sep 14, 1951   Cancelled Treatment:    Reason Eval/Treat Not Completed: Medical issues which prohibited therapy. Patient reports recently getting up with nursing and becoming orthostatic - reporting some dizziness and blurred vision. Patient expresses concerns regarding POC - stating he knows he indicated home with home health but that he worries about continued medical complications and endurance to perform home tasks. Will hold for now and f/u as able and update POC as needed.  [name] [name] 09/28/2020, 3:38 PM

## 2020-09-29 ENCOUNTER — Inpatient Hospital Stay (HOSPITAL_COMMUNITY): Payer: Medicare Other

## 2020-09-29 DIAGNOSIS — I10 Essential (primary) hypertension: Secondary | ICD-10-CM | POA: Diagnosis not present

## 2020-09-29 DIAGNOSIS — I4811 Longstanding persistent atrial fibrillation: Secondary | ICD-10-CM | POA: Diagnosis not present

## 2020-09-29 DIAGNOSIS — D62 Acute posthemorrhagic anemia: Secondary | ICD-10-CM | POA: Diagnosis not present

## 2020-09-29 DIAGNOSIS — R42 Dizziness and giddiness: Secondary | ICD-10-CM

## 2020-09-29 DIAGNOSIS — R112 Nausea with vomiting, unspecified: Secondary | ICD-10-CM | POA: Diagnosis not present

## 2020-09-29 LAB — CBC WITH DIFFERENTIAL/PLATELET
Abs Immature Granulocytes: 0.09 10*3/uL — ABNORMAL HIGH (ref 0.00–0.07)
Basophils Absolute: 0.1 10*3/uL (ref 0.0–0.1)
Basophils Relative: 1 %
Eosinophils Absolute: 1.3 10*3/uL — ABNORMAL HIGH (ref 0.0–0.5)
Eosinophils Relative: 13 %
HCT: 35.6 % — ABNORMAL LOW (ref 39.0–52.0)
Hemoglobin: 10.8 g/dL — ABNORMAL LOW (ref 13.0–17.0)
Immature Granulocytes: 1 %
Lymphocytes Relative: 19 %
Lymphs Abs: 2 10*3/uL (ref 0.7–4.0)
MCH: 28.1 pg (ref 26.0–34.0)
MCHC: 30.3 g/dL (ref 30.0–36.0)
MCV: 92.7 fL (ref 80.0–100.0)
Monocytes Absolute: 1 10*3/uL (ref 0.1–1.0)
Monocytes Relative: 10 %
Neutro Abs: 5.7 10*3/uL (ref 1.7–7.7)
Neutrophils Relative %: 56 %
Platelets: 289 10*3/uL (ref 150–400)
RBC: 3.84 MIL/uL — ABNORMAL LOW (ref 4.22–5.81)
RDW: 17.5 % — ABNORMAL HIGH (ref 11.5–15.5)
WBC: 10.2 10*3/uL (ref 4.0–10.5)
nRBC: 0 % (ref 0.0–0.2)

## 2020-09-29 LAB — COMPREHENSIVE METABOLIC PANEL
ALT: 24 U/L (ref 0–44)
AST: 36 U/L (ref 15–41)
Albumin: 2.2 g/dL — ABNORMAL LOW (ref 3.5–5.0)
Alkaline Phosphatase: 148 U/L — ABNORMAL HIGH (ref 38–126)
Anion gap: 9 (ref 5–15)
BUN: 12 mg/dL (ref 8–23)
CO2: 28 mmol/L (ref 22–32)
Calcium: 8.1 mg/dL — ABNORMAL LOW (ref 8.9–10.3)
Chloride: 96 mmol/L — ABNORMAL LOW (ref 98–111)
Creatinine, Ser: 1.09 mg/dL (ref 0.61–1.24)
GFR, Estimated: >60 mL/min (ref >60)
Glucose, Bld: 91 mg/dL (ref 70–99)
Potassium: 4 mmol/L (ref 3.5–5.1)
Sodium: 133 mmol/L — ABNORMAL LOW (ref 135–145)
Total Bilirubin: 0.8 mg/dL (ref 0.3–1.2)
Total Protein: 5.6 g/dL — ABNORMAL LOW (ref 6.5–8.1)

## 2020-09-29 LAB — MAGNESIUM: Magnesium: 2 mg/dL (ref 1.7–2.4)

## 2020-09-29 LAB — PHOSPHORUS: Phosphorus: 2.5 mg/dL (ref 2.5–4.6)

## 2020-09-29 MED ORDER — TECHNETIUM TC 99M MEBROFENIN IV KIT
5.2000 | PACK | Freq: Once | INTRAVENOUS | Status: AC
  Administered 2020-09-29: 5.2 via INTRAVENOUS

## 2020-09-29 MED ORDER — SODIUM CHLORIDE 0.9 % IV BOLUS
250.0000 mL | Freq: Once | INTRAVENOUS | Status: AC
  Administered 2020-09-29: 250 mL via INTRAVENOUS

## 2020-09-29 NOTE — Progress Notes (Signed)
PT Cancellation Note  Patient Details Name: ADALBERT ALBERTO MRN: [id number] DOB: 1951-06-02   Cancelled Treatment:     worked with OT in am up OOB to recliner but had to go back to bed due to drop BP.  This afternoon down stairs at imaging.  Will attempt to see tomorrow.     Felecia Shelling  PTA Acute  Rehabilitation Services Pager      [phone] Office      [phone]

## 2020-09-29 NOTE — Progress Notes (Signed)
PROGRESS NOTE    [name] Rice  ZDG:[id number] DOB: 1951-05-12 DOA: 09/23/2020 PCP: [name], PA-C   Brief Narrative:  HPI per [name] on 09/23/20 [name] Rice is a 69 y.o. male with medical history significant of chronic atrial fibrillation, CAD, s/p CABG almost 2 months ago, LBBB, chronic systolic heart failure of 45 to 50% per recent echocardiogram who was recently admitted and discharged from 09/14/2020 to 09/22/2020 for acute cholecystitis requiring ERCP with successful extraction of a stone, followed by partial laparoscopic cholecystectomy with JP drainage placement who returns to the hospital due to generalized weakness associated with decreased oral intake, abdominal pain, nausea, 2 episodes of emesis and a history of a mechanical fall at home without any significant injury. He states his diarrhea has gotten better in the past few days. He denies fever, but complains of chills and fatigue. He denies rhinorrhea, sore throat, wheezing or hemoptysis. No chest pain, palpitations, diaphoresis, PND or orthopnea. He gets occasional lower extremity edema, but states that this has improved significantly. He denies constipation, melena or hematochezia. He has had mild dysuria along with right flank pain, but no frequency, oliguria or hematuria. No polyuria, polydipsia, polyphagia or blurred vision.  ED Course: Initial vital signs were temperature 97.8 F, pulse 86, respirations 16, blood pressure 143/99 mmHg O2 sat 98% on room air.  Labs: Coronavirus and influenza PCR was negative.  Urine analysis showed large hemoglobinuria with more than 50 RBC microscopic examination.  Ketonuria 5 and proteinuria 30 mg/dL.  There was trace leukocyte esterase with 21-50 WBCs per hpf.  There were a few bacteria, mucus, but engaged and hyaline casts.  Imaging: 1 view chest radiograph shows enlarging left pleural effusion likely reflecting passive atelectasis though underlying infection or edema  could not be fully excluded.  There is vascular congestion and grading opacity in the right lung which is favored to be a combination of atelectasis and edema.  There is trace right pleural effusion as well.  CT abdomen/pelvis with contrast shows status post cholecystectomy with ill-defined fluid and edema in the operative bed.  Considerations include phlegmon, developing abscess, biloma and postoperative hematoma.  Development of right-sided urinary tract obstruction secondary to a punctate mid right ureteric stone treated close to left-sided stone at the ureteropelvic junction without significant hydronephrosis.  There is atelectasis.  There is fat and right side of the bladder within an inguinal hernia.  Please see images and full radiology report.  **Interim History  His diet was advanced to clear liquid diet and will go to soft diet.  Dietitian was consulted for further evaluation we will add probiotics.  Patient states that he feels generally weak but he is improving.  Further work-up done and he had a thoracentesis today which yielded 1.85 L and he was given a dose of IV Lasix for diuresis.  General surgery does not feel he has a bile leak and they are recommending continuing drain output.  PT OT to reevaluate today and urology evaluation still pending but Dr. [name] recommended starting the patient on tamsulosin and having follow-up in outpatient setting as he did not feel like any surgical intervention is warranted for his ureteral stone.  Patient was given a dose of IV Lasix on 09/25/2020 and will be given another dose today given his volume overload.  Likely has acute on chronic diastolic CHF.  Cardiology is consulted for further evaluation recommendations.  His antibiotics have now stopped and patient has a rash that has developed  we will provide Benadryl.  And monitor off of antibiotics.  We will also stop antifungals given his negative growth and urine culture.  On 09/26/2020 cardiology was  consulted and changed the patient to IV Bumex 1 mg twice daily and then today has been changed to p.o. Lasix 40 mg daily.  Cardiology feels that his shortness of breath is multifactorial and is improving.  Patient continues to have some lightheadedness and cardiology recommending possibly stopping or reducing the metoprolol.  Patient's rash overnight got little bit worse so we will start him on H2 blocker blocker  09/28/20: Patient's rash is improving on famotidine but today it looks like he is over diuresed as he would not change from his IV Bumex to p.o. Lasix.  I have discontinued IV Bumex and his blood pressure was optimal.  Underwent orthostatic bowel sounds and was orthostatic and dizzy.  We will hold his metoprolol and his Lasix and give him a small to 50 bolus and have ordered him a abdominal binder.  09/29/2020: Patient still felt lightheaded and dizzy despite his antihypertensives and his Lasix being held.  Will be given another 250 bolus and have ordered him an abdominal binder and TED hose.  He had the TED hose and his orthostatic vital signs did drop this morning when he got from the bed to the chair.  Because the color of his output has JP drain change there is concern for bile leak so general surgery will be ordering a NM HIDA scan to assess for Bile leak.  OT reevaluated and recommending SNF now.  Assessment & Plan:   Principal Problem:   Nausea and vomiting Active Problems:   Atrial fibrillation (HCC)   Pure hypercholesterolemia   Malnutrition of moderate degree (HCC)   Acute blood loss anemia   CAD, multiple vessel   Benign hypertension   Right sided hydronephrosis with renal and ureteral calculus obstruction   Budding yeast in U/A detected   Generalized weakness  Mild Right sided hydronephrosis with renal and ureteral calculus obstruction and ? UTI poA -Continue IV hydration and reduce the Rate to 75 mL/hr; Has development of Right Sideded Urinary Tract Obstruction due to  Punctate Mid Ureteric Stone -Tamsulosin 0.4 mg p.o. twice daily and will change to Daily  -Discussed with Urology Dr. Gwynneth Macleod and he will evaluate eventually  -Leukocytosis on admission of 11.6 which is now improved to 9.2 yesterday but could have been in the setting of continued dehydration: WBC is now 9.7 -patient's urinalysis showed a hazy appearance with large hemoglobin, 5 ketones, trace leukocytes, negative nitrites, few bacteria, greater than 50 RBCs per high-power field, and 21-50 WBCs; urine culture still pending but was obtained after he is placed on antibiotics; urine culture showed no growth so we will stop antibiotics -Patient is also started on p.o. fluconazole and will now discontinue -Antibiotics have now been discontinued and general surgery feels that he does not need more antibiotics from a gallbladder standpoint; patient did develop a rash likely in the setting of his antibiotics-we will need to continue to monitor and watch off of antibiotics  Generalized weakness and poor p.o. intake -Was In the setting of dehydration and poor p.o. intake and recent gallbladder issues and possible UTI -IV fluid hydration is now stopped and he had a worsened left-sided pleural effusion.  He was given a dose of IV Lasix and had a thoracentesis.  Cardiology evaluated and changed his Lasix to IV Bumex.  IV Bumex was supposed to be changed  to p.o. Lasix yesterday but unfortunately did not get done and IV Bumex was given yesterday.  This was discontinued and changed to p.o. Lasix this morning however patient's blood pressure was on the low side so p.o. Lasix was held -PT OT to further evaluate and treat and initially were recommending home health but now have escalated his level of care to skilled nursing facility and will reach out to the transition of care management to help assistance with placement -Recommend OOB to Chair -Patient continues to be orthostatic so we will continue to hold metoprolol  furosemide in order to 50 mL bolus in addition to his TED hose and his abdominal binder.  Abdominal binder was never obtained yesterday so will be obtained today  Budding yeast in U/A detected -Will treat given symptoms and possible need for urological procedure; urology feels that no surgical intervention is warranted at this time but they will come evaluate the patient later on -Fluconazole now stopped  Fluid and Edema in the Operative Bed Recently infected gallbladder with cholelithiasis status post partial cholecystectomy and drain placement done on 09/18/2020 Choledocholithiasis status post ERCP/sphincterotomy/balloon extraction of the biliary stones -? Phlegmon/Developing Abscess, Biloma, Postoperative Hematoma -Will discuss with General Surgery and they do not feel that he has an abscess and feel that the findings were a posterior wall of his gallbladder -Antibiotics had stopped them because of the color of his output his JP drain changed General surgery is concerned for bile leak versus old blood -Patient has no issues with eating and denies any abdominal pain but general surgery is checking HIDA scan  Atrial fibrillation, chronic (HCC) -CHA?DS?-VASc Score of at least 4. -S/p Maze Procedure on 07/29/20 -Continued metoprolol 12.5 mg p.o. daily I have now discontinued given his orthostatics and will continue to hold off for now -Continue Xarelto for anticoagulation. -Continue to monitor on telemetry  Acquired Thrombophilia -Think of his atrial fibrillation -Patient's CHA2DS2-VASc is 4 -Continue with anticoagulation with Xarelto  Pure Hypercholesterolemia -Hold Rosuvastatin while on fluconazole and resume now that fluconazole is being discontinued  Malnutrition of moderate degree (HCC) -Dietitian was consulted for further evaluation recommendations -They are recommending Ensure Enlive p.o. twice daily -Continue with multivitamin with minerals daily -Diet has been advanced  from a clear liquid diet to soft -We will add a probiotic as well  Normocytic Anemia -In the setting of 2 recent surgeries. -Hemoglobin higher than baseline and is slowly trending down as hemoglobin/hematocrit went from 12.0/39.2 -> 11.3/36.2 -> 10.0/33.2 -> 10.7/35.6 -> 11.5/38.2 and today is back down to 10.8/35.6 -Continue monitor hemoglobin/hematocrit and monitor for signs and symptoms of bleeding as he is anticoagulated; currently no overt bleeding noted -Repeat CBC in a.m.  CAD, multiple vessel -Continue beta-blocker and Xarelto. -Holding a statin while using fluconazole but since fluconazole has been stopped we will Resume Statin now  Benign Hypertension -> Mild Hypotension;, continues to be hypotensive -He has been holding amlodipine and olmesartan. -Continued Metoprolol 12.5 mg p.o. daily but stopped now -Monitor blood pressure and heart rate. -Blood pressures on the lower side at 93/65 and he is lightheaded and dizzy -Continue monitor blood pressure per protocol  Orthostatic Hypotension  -Was lightheaded and Dizzy upon standing; Per Nursing "His sitting pressure was 113/74, standing he dropped to 85/49. Upon sitting back down about 2 minutes later he was 129/74, and he just called the CNA to check it again because he felt it had dropped again and he is currently at 93/65" -BP was on the lower  side at 93/65 and he did drop today: -Given a 250 mL bolus yesterday and will give one today -Hold Metoprol and Lasix -Abdominal Binder and TED Hose -Repeat Orthostatic VS in the AM  -C/w PT/OT efforts   Left-Sided Pleural Effusion, poA and improved -IV Lasix has now been discontinued -Patient is -4.073 L since admission -Ordered a thoracentesis to be done and he had a left-sided thoracentesis which yielded 1.85 L of fluid -Ordered a Fluid analysis on the pleural fluid and showed a red color with 1109 total nucleated cell count, 50 lymphs, turbid appearance, 43 monocytes, with Gram  stain WBCs and no organisms seen currently -Repeat chest x-ray showed "Small left effusion with underlying opacity, likely atelectasis. This finding is unchanged. No other changes" -Will add Flutter Valve and Incentive Spirometry   Acute on Chronic Diastolic CHF, improved -Chest x-ray a few days ago showed "Prior CABG. Left atrial appendage clip in stable position. Stable cardiomegaly. Bibasilar interstitial prominence noted on today's exam. Persistent left-sided pleural effusion. Findings suggest CHF." -CXR from today as above -Diuresis is now been discontinued given his orthostatics -strict I's and O's, daily weights -BNP 151.4 -Repeat ECHO done and showed "Left Ventricle: Left ventricular ejection fraction, by estimation, is 45 to 50%. The left ventricle has mildly decreased function. The left ventricle demonstrates regional wall motion abnormalities. Abnormal (paradoxical) septal motion consistent with post-operative status. Left ventricular diastolic parameters are indeterminate."  -We will no longer fluid restrict to 1500 mL -Diuresis has been held -Cardiology is consulted for further evaluation recommendations and recommending holding Lasix and Metoprolol  -Continue to monitor for signs and symptoms of volume overload and repeat chest x-ray in a.m. -Follow-up on cardiology recommendations and will repeat a CMP in the AM   Rash, Improving significantly almost resolved -Diffuse whole body rash could be from contact dermatitis or medication induced -Have stopped antibiotics, PPI, and Probiotics -Provide the patient with Benadryl cream -Will start H2 Blocker BID IV and if still persists consider steroids; Rash is improving and will hold off on Steroids  -Likely from Zosyn  Hyponatremia -Had improved to 137 is now back down to 133 -Currently holding diuresis -Repeat CMP in a.m.   Obesity -Complicates overall prognosis and Care -Estimated body mass index is 31.05 kg/m as calculated  from the following:   Height as of this encounter: 6\' 2"  (1.88 m).   Weight as of this encounter: 109.7 kg. -Weight Loss and Dietary Counseling given   DVT prophylaxis: Anticoagulated with Xarelto Code Status: FULL CODE  Family Communication: No family present at bedside Disposition Plan: Pending further clinical improvement and tolerance of p.o. diet and evaluation by PT and OT; Will need Repeat Orthostatics in the AM as he was orthostatic again today;  Status is: Inpatient  Remains inpatient appropriate because:Unsafe d/c plan, IV treatments appropriate due to intensity of illness or inability to take PO and Inpatient level of care appropriate due to severity of illness   Dispo: The patient is from: Home              Anticipated d/c is to: SNF              Anticipated d/c date is: 1-2 days              Patient currently is not medically stable to d/c.  Consultants:   Urology  General Surgery   Cardiology  IR for Thoracentesis   Procedures: Successful image-guided left thoracentesis. Yielded 1.85 liters of dark red  fluid. Procedure was stopped after 1.85 L secondary to patient's BP (78/40, repeat 85/49)- at that time, patient stated he was lightheaded with blurred vision. He was placed in RT with improvement of BP (92/51, repeat 106/56) and resolution of symptoms. Patient tolerated procedure well. No immediate complications. EBL < 1 mL.  Antimicrobials:  Anti-infectives (From admission, onward)   Start     Dose/Rate Route Frequency Ordered Stop   09/26/20 1000  fluconazole (DIFLUCAN) tablet 200 mg  Status:  Discontinued        200 mg Oral Daily 09/25/20 1355 09/26/20 1728   09/24/20 0800  piperacillin-tazobactam (ZOSYN) IVPB 3.375 g  Status:  Discontinued        3.375 g 12.5 mL/hr over 240 Minutes Intravenous Every 8 hours 09/24/20 0030 09/26/20 1151   09/24/20 0030  piperacillin-tazobactam (ZOSYN) IVPB 3.375 g        3.375 g 100 mL/hr over 30 Minutes Intravenous  Once  09/24/20 0006 09/24/20 0138   09/24/20 0015  fluconazole (DIFLUCAN) tablet 150 mg  Status:  Discontinued       Note to Pharmacy: Budding yeast urine with some urinary symptoms who will likely need to undergo cystoscopy.   150 mg Oral Daily 09/24/20 0010 09/25/20 1355        Subjective: Seen and examined at bedside he was not feeling as good and was lightheaded and dizzy today.  Thinks his rash is improved significantly but worried about his blood pressure.  No nausea or vomiting.  Denies any chest pain, but was lightheaded and dizzy earlier.  No shortness of breath.  No other concerns or complaints at this time.  Objective: Vitals:   09/28/20 1416 09/28/20 1944 09/29/20 0359 09/29/20 0935  BP: 92/61 109/61 120/71 93/65  Pulse: 81 89 90   Resp:  18 20   Temp: 98 F (36.7 C) 98.6 F (37 C) 97.8 F (36.6 C)   TempSrc: Oral Oral Oral   SpO2: 99% 100% 97%   Weight:      Height:        Intake/Output Summary (Last 24 hours) at 09/29/2020 1343 Last data filed at 09/29/2020 1217 Gross per 24 hour  Intake 800 ml  Output 1635 ml  Net -835 ml   Filed Weights   09/24/20 0130  Weight: 109.7 kg   Examination: Physical Exam:  Constitutional: WN/WD, NAD and appears calm and comfortable Eyes: PERRL, lids and conjunctivae normal, sclerae anicteric  ENMT: External Ears, Nose appear normal. Grossly normal hearing. Neck: Appears normal, supple, no cervical masses, normal ROM, no appreciable thyromegaly; no JVD Respiratory: Diminished to auscultation bilaterally with coarse breath sounds, no wheezing, rales, rhonchi or crackles. Normal respiratory effort and patient is not tachypenic. No accessory muscle use.  Cardiovascular: RRR, no murmurs / rubs / gallops. S1 and S2 auscultated.  Has 1+ lower extremity edema Abdomen: Soft, non-tender, distended secondary to body habitus. Bowel sounds positive.  Has a JP drain in the right side of his abdomen GU: Deferred. Musculoskeletal: No clubbing /  cyanosis of digits/nails. No joint deformity upper and lower extremities.  Skin: Patient's diffuse skin rash is improved significantly and not as prominent.. No induration; Warm and dry.  Neurologic: CN 2-12 grossly intact with no focal deficits. Romberg sign and cerebellar reflexes not assessed.  Psychiatric: Normal judgment and insight. Alert and oriented x 3. Normal mood and appropriate affect.   Data Reviewed: I have personally reviewed following labs and imaging studies  CBC: Recent Labs  Lab  09/25/20 0420 09/26/20 0543 09/27/20 0509 09/28/20 0415 09/29/20 0434  WBC 9.2 10.3 9.7 9.0 10.2  NEUTROABS 5.2 6.6 5.8 4.9 5.7  HGB 10.0* 10.7* 11.5* 10.5* 10.8*  HCT 33.3* 35.6* 38.2* 34.1* 35.6*  MCV 94.1 93.4 92.9 92.7 92.7  PLT 313 292 289 292 289   Basic Metabolic Panel: Recent Labs  Lab 09/25/20 0420 09/26/20 0543 09/27/20 0509 09/28/20 0415 09/29/20 0434  NA 136 133* 135 137 133*  K 3.9 3.5 3.6 3.5 4.0  CL 102 100 96* 95* 96*  CO2 32 28  GLUCOSE 83 89 108* 93 91  BUN 5* <5* CREATININE 1.02 0.95 1.06 1.21 1.09  CALCIUM 7.9* 7.9* 8.0* 7.9* 8.1*  MG 1.7 1.9 1.9 2.0 2.0  PHOS 2.8 2.8 2.9 3.2 2.5   GFR: Estimated Creatinine Clearance: 86.7 mL/min (by C-G formula based on SCr of 1.09 mg/dL). Liver Function Tests: Recent Labs  Lab 09/25/20 0420 09/26/20 0543 09/27/20 0509 09/28/20 0415 09/29/20 0434  AST 36  ALT ALKPHOS 150* 153* 165* 152* 148*  BILITOT 1.1 1.0 0.9 0.7 0.8  PROT 5.2* 5.1* 5.5* 5.4* 5.6*  ALBUMIN 2.1* 2.0* 2.3* 2.1* 2.2*   Recent Labs  Lab 09/23/20 1800  LIPASE 44   No results for input(s): AMMONIA in the last 168 hours. Coagulation Profile: No results for input(s): INR, PROTIME in the last 168 hours. Cardiac Enzymes: No results for input(s): CKTOTAL, CKMB, CKMBINDEX, TROPONINI in the last 168 hours. BNP (last 3 results) No results for input(s): PROBNP in the last 8760 hours. HbA1C: No  results for input(s): HGBA1C in the last 72 hours. CBG: No results for input(s): GLUCAP in the last 168 hours. Lipid Profile: No results for input(s): CHOL, HDL, LDLCALC, TRIG, CHOLHDL, LDLDIRECT in the last 72 hours. Thyroid Function Tests: No results for input(s): TSH, T4TOTAL, FREET4, T3FREE, THYROIDAB in the last 72 hours. Anemia Panel: No results for input(s): VITAMINB12, FOLATE, FERRITIN, TIBC, IRON, RETICCTPCT in the last 72 hours. Sepsis Labs: No results for input(s): PROCALCITON, LATICACIDVEN in the last 168 hours.  Recent Results (from the past 240 hour(s))  Resp Panel by RT PCR (RSV, Flu A&B, Covid) - Nasopharyngeal Swab     Status: None   Collection Time: 09/23/20  8:01 PM   Specimen: Nasopharyngeal Swab  Result Value Ref Range Status   SARS Coronavirus 2 by RT PCR NEGATIVE NEGATIVE Final    Comment: (NOTE) SARS-CoV-2 target nucleic acids are NOT DETECTED.  The SARS-CoV-2 RNA is generally detectable in upper respiratoy specimens during the acute phase of infection. The lowest concentration of SARS-CoV-2 viral copies this assay can detect is 131 copies/mL. A negative result does not preclude SARS-Cov-2 infection and should not be used as the sole basis for treatment or other patient management decisions. A negative result may occur with  improper specimen collection/handling, submission of specimen other than nasopharyngeal swab, presence of viral mutation(s) within the areas targeted by this assay, and inadequate number of viral copies (<131 copies/mL). A negative result must be combined with clinical observations, patient history, and epidemiological information. The expected result is Negative.  Fact Sheet for Patients:  https://www.moore.com/  Fact Sheet for Healthcare Providers:  https://www.young.biz/  This test is no t yet approved or cleared by the [place] FDA and  has been authorized for detection and/or  diagnosis of SARS-CoV-2 by FDA under an Emergency Use Authorization (EUA). This EUA  will remain  in effect (meaning this test can be used) for the duration of the COVID-19 declaration under Section 564(b)(1) of the Act, 21 U.S.C. section 360bbb-3(b)(1), unless the authorization is terminated or revoked sooner.     Influenza A by PCR NEGATIVE NEGATIVE Final   Influenza B by PCR NEGATIVE NEGATIVE Final    Comment: (NOTE) The Xpert Xpress SARS-CoV-2/FLU/RSV assay is intended as an aid in  the diagnosis of influenza from Nasopharyngeal swab specimens and  should not be used as a sole basis for treatment. Nasal washings and  aspirates are unacceptable for Xpert Xpress SARS-CoV-2/FLU/RSV  testing.  Fact Sheet for Patients: https://www.moore.com/  Fact Sheet for Healthcare Providers: https://www.young.biz/  This test is not yet approved or cleared by the [place] FDA and  has been authorized for detection and/or diagnosis of SARS-CoV-2 by  FDA under an Emergency Use Authorization (EUA). This EUA will remain  in effect (meaning this test can be used) for the duration of the  Covid-19 declaration under Section 564(b)(1) of the Act, 21  U.S.C. section 360bbb-3(b)(1), unless the authorization is  terminated or revoked.    Respiratory Syncytial Virus by PCR NEGATIVE NEGATIVE Final    Comment: (NOTE) Fact Sheet for Patients: https://www.moore.com/  Fact Sheet for Healthcare Providers: https://www.young.biz/  This test is not yet approved or cleared by the [place] FDA and  has been authorized for detection and/or diagnosis of SARS-CoV-2 by  FDA under an Emergency Use Authorization (EUA). This EUA will remain  in effect (meaning this test can be used) for the duration of the  COVID-19 declaration under Section 564(b)(1) of the Act, 21 U.S.C.  section 360bbb-3(b)(1), unless the authorization is  terminated or  revoked. Performed at [facility], [address], [place], [place] [address]   Culture, Urine     Status: None   Collection Time: 09/24/20  2:00 PM   Specimen: Urine, Random  Result Value Ref Range Status   Specimen Description   Final    URINE, RANDOM Performed at [facility], [address], [place], [place] [address]    Special Requests   Final    NONE Performed at [facility], [address], [place], [place] [address]    Culture   Final    NO GROWTH Performed at [facility], [address] [place], [place] [address]    Report Status 09/25/2020 FINAL  Final  Body fluid culture     Status: None   Collection Time: 09/25/20 11:24 AM   Specimen: PATH Cytology Pleural fluid  Result Value Ref Range Status   Specimen Description   Final    PLEURAL LT Performed at [facility], [address] [place], [place] [address]    Special Requests   Final    NONE Performed at [facility], [address], [place], [place] [address]    Gram Stain   Final    FEW WBC PRESENT, PREDOMINANTLY MONONUCLEAR NO ORGANISMS SEEN    Culture   Final    NO GROWTH 3 DAYS Performed at [facility], [address], [place], [place] [address]    Report Status 09/28/2020 FINAL  Final     RN Pressure Injury Documentation:     Estimated body mass index is 31.05 kg/m as calculated from the following:   Height as of this encounter: 6\' 2"  (1.88 m).   Weight as of this encounter: 109.7  kg.  Malnutrition Type: Nutrition Problem: Inadequate oral intake Etiology: poor appetite, other (see comment) Malnutrition Characteristics: Signs/Symptoms: per patient/family report Nutrition Interventions: Interventions: Ensure Enlive (each supplement provides 350kcal and 20 grams of protein), MVI  Radiology Studies: DG CHEST PORT 1 VIEW  Result Date: 09/28/2020 CLINICAL DATA:   Shortness of breath EXAM: PORTABLE CHEST 1 VIEW COMPARISON:  September 26, 2020 FINDINGS: Calcified granuloma in the right base, unchanged. Small left effusion with underlying atelectasis. The cardiomediastinal silhouette is stable. No pneumothorax. No other changes. IMPRESSION: Small left effusion with underlying opacity, likely atelectasis. This finding is unchanged. No other changes. Electronically Signed   By: Gerome Samavid  Williams III M.D   On: 09/28/2020 09:25   Scheduled Meds: . famotidine  20 mg Oral BID  . feeding supplement  237 mL Oral BID BM  . magnesium oxide  200 mg Oral BID  . multivitamin with minerals  1 tablet Oral Daily  . potassium chloride  20 mEq Oral Daily  . rivaroxaban  20 mg Oral Q supper  . rosuvastatin  40 mg Oral QHS  . tamsulosin  0.4 mg Oral Daily   Continuous Infusions: . sodium chloride      LOS: 5 days    Merlene Laughtermair Latif Reno Clasby, DO [facility] PAGER is on AMION  If 7PM-7AM, please contact night-coverage [email or web address]

## 2020-09-29 NOTE — TOC Initial Note (Signed)
Transition of Care [facility]) - Initial/Assessment Note    Patient Details  Name: [name] Rice MRN: [id number] Date of Birth: Jan 11, 1951  Transition of Care [facility]) CM/SW Contact:    Armanda Heritage, RN Phone Number: 09/29/2020, 1:52 PM  Clinical Narrative:   Clovis Cao faxed out to area facilities, patient is interested in SNF in [facility] and Rehab) referral faxed to facility and fax # [phone].                  Expected Discharge Plan: Skilled Nursing Facility     Patient Goals and CMS Choice Patient states their goals for this hospitalization and ongoing recovery are:: to go to rehab      Expected Discharge Plan and Services Expected Discharge Plan: Skilled Nursing Facility   Discharge Planning Services: CM Consult   Living arrangements for the past 2 months: Single Family Home                                      Prior Living Arrangements/Services Living arrangements for the past 2 months: Single Family Home Lives with:: Self Patient language and need for interpreter reviewed:: Yes Do you feel safe going back to the place where you live?: Yes      Need for Family Participation in Patient Care: Yes (Comment) Care giver support system in place?: Yes (comment)   Criminal Activity/Legal Involvement Pertinent to Current Situation/Hospitalization: No - Comment as needed  Activities of Daily Living Home Assistive Devices/Equipment: Dan Humphreys (specify type) ADL Screening (condition at time of admission) Patient's cognitive ability adequate to safely complete daily activities?: Yes Is the patient deaf or have difficulty hearing?: No Does the patient have difficulty seeing, even when wearing glasses/contacts?: No Does the patient have difficulty concentrating, remembering, or making decisions?: No Patient able to express need for assistance with ADLs?: Yes Does the patient have difficulty dressing or bathing?: No Independently performs ADLs?: Yes  (appropriate for developmental age) Does the patient have difficulty walking or climbing stairs?: No Weakness of Legs: Both Weakness of Arms/Hands: Both  Permission Sought/Granted                  Emotional Assessment Appearance:: Appears stated age Attitude/Demeanor/Rapport: Engaged Affect (typically observed): Accepting Orientation: : Oriented to Self, Oriented to Place, Oriented to  Time, Oriented to Situation   Psych Involvement: No (comment)  Admission diagnosis:  Nephrolithiasis [N20.0] Nausea and vomiting [R11.2] Bilious vomiting with nausea [R11.14] Generalized weakness [R53.1] Patient Active Problem List   Diagnosis Date Noted  . Budding yeast in U/A detected 09/24/2020  . Generalized weakness 09/24/2020  . Nausea and vomiting 09/23/2020  . Right sided hydronephrosis with renal and ureteral calculus obstruction 09/23/2020  . Benign hypertension   . Abnormal magnetic resonance cholangiopancreatography (MRCP)   . Common bile duct (CBD) obstruction   . Elevated LFTs   . Choledocholithiasis with acute cholecystitis   . AKI (acute kidney injury) (HCC) 09/15/2020  . Sepsis (HCC) 09/15/2020  . Abdominal pain 09/15/2020  . Abnormal CT of the abdomen   . Abnormal liver enzymes   . Acute cholecystitis 09/14/2020  . Malnutrition of moderate degree (HCC) 08/07/2020  . Acute blood loss anemia 08/07/2020  . CAD, multiple vessel 08/07/2020  . 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 Postural dizziness with presyncope 07/24/2020  . Hypokalemia 07/24/2020  . Diarrhea 07/24/2020  . Lightheadedness 07/24/2020  . Long term current  use of anticoagulant 01/14/2020  . Benign hypertension with CKD (chronic kidney disease) stage III 06/12/2019  . Pure hypercholesterolemia 06/12/2019  . Atrial fibrillation (HCC) 09/25/2016   PCP:  [name], PA-C Pharmacy:   [facility] [phone] - [place], [place] - [address] [place]  [address] Phone: [phone] Fax: [phone]     Social Determinants of Health (SDOH) Interventions    Readmission Risk Interventions Readmission Risk Prevention Plan 09/25/2020  Transportation Screening Complete  PCP or Specialist Appt within 3-5 Days Complete  HRI or Home Care Consult Complete  Some recent data might be hidden

## 2020-09-29 NOTE — Progress Notes (Signed)
Progress Note  Patient Name: [name] Rice Date of Encounter: 09/29/2020  Attending physician: Merlene Laughter, DO Primary care provider: [name], PA-C Primary Cardiologist: Tessa Lerner, DO, [facility] Consultant:Jeannifer Drakeford [place], [place], [facility]  Subjective: [name] Rice is a 69 y.o. male who was seen and examined at bedside at approximately 6 PM. From a cardiovascular standpoint he denies any chest pain or shortness of breath. Patient states that his blood pressure continues to fluctuate with changing positions. Since wearing the abdominal binder later this evening blood pressure log.  Objective: Vital Signs in the last 24 hours: Temp:  [97.8 F (36.6 C)-98.4 F (36.9 C)] 97.8 F (36.6 C) (11/22 2100) Pulse Rate:  [76-91] 90 (11/22 2100) Resp:  [16-20] 20 (11/22 2100) BP: (93-136)/(65-75) 136/74 (11/22 2100) SpO2:  [96 %-97 %] 96 % (11/22 2100)  Intake/Output:  Intake/Output Summary (Last 24 hours) at 09/29/2020 2340 Last data filed at 09/29/2020 2100 Gross per 24 hour  Intake 1170 ml  Output 1835 ml  Net -665 ml    Net IO Since Admission: -3,743.55 mL [09/29/20 2340]  Weights:  Filed Weights   09/24/20 0130  Weight: 109.7 kg    Telemetry: Personally reviewed.  Atrial fibrillation.  Physical examination: PHYSICAL EXAM: Vitals with BMI 09/29/2020 09/29/2020 09/29/2020  Height - - -  Weight - - -  BMI - - -  Systolic 136 115 619  Diastolic 74 75 72  Pulse 90 76 91    CONSTITUTIONAL:Appears older than stated age, hemodynamically stable, no acute distress. SKIN: Skin is warm and dry. No cyanosis. No pallor.  Rash noted over the abdomen, anterior chest, right lower extremity HEAD: Normocephalic and atraumatic.  EYES: No scleral icterus MOUTH/THROAT: Moist oral membranes.  NECK: No JVD present. No thyromegaly noted. No carotid bruits  LYMPHATIC: No visible cervical adenopathy.  CHEST Normal respiratory effort. No intercostal retractions. Sternotomy site  is healing well. LUNGS:Minimal rales, decreased breath sounds bilaterally, no stridor. No wheezes. CARDIOVASCULAR:Irregularly irregular,p normal ositive S1-S2, no murmurs rubs or gallops appreciated. ABDOMINAL: Soft, nontender, nondistended, positive bowel sounds in all 4 quadrants, no apparent ascites.  JP drain in place, rash noted.  EXTREMITIES: +1 peripheral edema.Left radial site is healing well. HEMATOLOGIC: No significant bruising NEUROLOGIC: Oriented to person, place, and time. Nonfocal. Normal muscle tone.  PSYCHIATRIC: Normal mood and affect. Normal behavior. Cooperative.  Lab Results: Hematology Recent Labs  Lab 09/27/20 0509 09/28/20 0415 09/29/20 0434  WBC 9.7 9.0 10.2  RBC 4.11* 3.68* 3.84*  HGB 11.5* 10.5* 10.8*  HCT 38.2* 34.1* 35.6*  MCV 92.9 92.7 92.7  MCH 28.0 28.5 28.1  MCHC 30.1 30.8 30.3  RDW 17.2* 17.4* 17.5*  PLT 289 292 289    Chemistry Recent Labs  Lab 09/27/20 0509 09/28/20 0415 09/29/20 0434  NA 135 137 133*  K 3.6 3.5 4.0  CL 96* 95* 96*  CO2 27 32 28  GLUCOSE 108* 93 91  BUN 8 8 12   CREATININE 1.06 1.21 1.09  CALCIUM 8.0* 7.9* 8.1*  PROT 5.5* 5.4* 5.6*  ALBUMIN 2.3* 2.1* 2.2*  AST 25 29 36  ALT 17 19 24   ALKPHOS 165* 152* 148*  BILITOT 0.9 0.7 0.8  GFRNONAA >60 >60 >60  ANIONGAP 12 10 9      Cardiac Enzymes: Cardiac Panel (last 3 results) No results for input(s): CKTOTAL, CKMB, TROPONINIHS, RELINDX in the last 72 hours.  BNP (last 3 results) Recent Labs    07/24/20 0748 09/27/20 0509  BNP 206.5* 151.4*  ProBNP (last 3 results) No results for input(s): PROBNP in the last 8760 hours.  DDimer No results for input(s): DDIMER in the last 168 hours.   Hemoglobin A1c:  Lab Results  Component Value Date   HGBA1C 5.2 07/26/2020   MPG 102.54 07/26/2020    TSH  Recent Labs    07/02/20 1604 07/24/20 0748 09/16/20 0506  TSH 1.898 0.941 1.083    Lipid Panel     Component Value Date/Time   CHOL 78 07/26/2020  0515   CHOL 143 07/10/2019 0906   TRIG 92 07/26/2020 0515   HDL 24 (L) 07/26/2020 0515   HDL 51 07/10/2019 0906   CHOLHDL 3.3 07/26/2020 0515   VLDL 18 07/26/2020 0515   LDLCALC 36 07/26/2020 0515   LDLCALC 74 07/10/2019 0906    Imaging: DG CHEST PORT 1 VIEW  Result Date: 09/28/2020 CLINICAL DATA:  Shortness of breath EXAM: PORTABLE CHEST 1 VIEW COMPARISON:  September 26, 2020 FINDINGS: Calcified granuloma in the right base, unchanged. Small left effusion with underlying atelectasis. The cardiomediastinal silhouette is stable. No pneumothorax. No other changes. IMPRESSION: Small left effusion with underlying opacity, likely atelectasis. This finding is unchanged. No other changes. Electronically Signed   By: Gerome Sam III M.D   On: 09/28/2020 09:25   NM HEPATO BILIARY LEAK  Result Date: 09/29/2020 CLINICAL DATA:  Upper abdominal pain, recurrent status post cholecystectomy. EXAM: NUCLEAR MEDICINE HEPATOBILIARY IMAGING TECHNIQUE: Sequential images of the abdomen were obtained out to 60 minutes following intravenous administration of radiopharmaceutical. RADIOPHARMACEUTICALS:  5.2 mCi Tc-42m  Choletec IV COMPARISON:  CT abdomen and pelvis of September 23, 2020 FINDINGS: Prompt uptake of activity seen in the liver with excretion into the common bile duct and beyond into the small bowel. No sign of leak. Imaging was performed for total of 2 hours. Faint activity along the RIGHT lateral aspect of the patient is related to the IV. IMPRESSION: No sign of biliary leak or obstruction Electronically Signed   By: Donzetta Kohut M.D.   On: 09/29/2020 16:42    EKG 09/14/2020: Atrial fibrillation 89 bpm LBBB  CABGX4 07/29/2020: (Dr. Vickey Sages) LIMA-LAD, RIMA-PDA, left radial-OM/ramus Bi-atrial Maze procedure and left atrial appendage clipping  Echocardiogram 09/27/2020: 1. Left ventricular ejection fraction, by estimation, is 45 to 50%,  although evaluation limited due to poor visualization of  the endocardium.  The left ventricle has mildly decreased function. The left ventricle  demonstrates regional wall motion  abnormalities (see scoring diagram/findings for description). Left  ventricular diastolic parameters are indeterminate.  2. Right ventricular systolic function is normal. The right ventricular  size is normal.  3. Left atrial size was mildly dilated.  4. Right atrial size was mildly dilated.  5. Normal right atrial pressure.  6. Compared to previous study on 07/24/2020, post-op changes are new.   Heart Catheterization 07/25/20: LM: Distal 30-40% stenosis. LAD: LAD diffusely diseased, proximal diffuse 80% followed by tandem 90% stenosis. Large D1 with moderate diffuse disease and proximal and mid tandem 70 and 80% stenosis. Has secondary branches and is tortuous. Cx and RI: Co-dominant Cx with Ostial circumflex 99% involving a moderate sized ramus with ostial 80 to 90% and proximal 90% stenosis. RCA: Proximal RCA 80% stenosis, large vessel with mild disease in PL and PDA branches. A secondary PL branch is subtotally occluded. LV: Normal LVEDP. No pressure gradient across the aortic valve. Patent LIMA and RIMA and RIMA has ostial 20-30% stenosis. Subclavian arteries widely patent.  Right radial diffusely disease by  [place]. 74mL contrast used.   Carotid duplex 07/27/2020:  Right Carotid: Velocities in the right ICA are consistent with a 1-39% stenosis.  Left Carotid: Velocities in the left ICA are consistent with a 60-79% stenosis.  Vertebrals: Bilateral vertebral arteries demonstrate antegrade flow.  Scheduled Meds: . famotidine  20 mg Oral BID  . feeding supplement  237 mL Oral BID BM  . magnesium oxide  200 mg Oral BID  . multivitamin with minerals  1 tablet Oral Daily  . potassium chloride  20 mEq Oral Daily  . rivaroxaban  20 mg Oral Q supper  . rosuvastatin  40 mg Oral QHS  . tamsulosin  0.4 mg Oral Daily    Continuous Infusions:   PRN  Meds: acetaminophen **OR** acetaminophen, diphenhydrAMINE-zinc acetate, HYDROcodone-acetaminophen, HYDROmorphone (DILAUDID) injection, melatonin, ondansetron **OR** ondansetron (ZOFRAN) IV   IMPRESSION & RECOMMENDATIONS: [name] Rice is a 69 y.o. male whose past medical history and cardiac risk factors include: Multivessel CAD status post four-vessel bypass 07/2020, biatrial Maze procedure, clipping of the left atrial appendage, asymptomatic left carotid artery stenosis, history of nonsustained ventricular tachycardia and ventricular standstill, persistent atrial fibrillation, hypertension, hyperlipidemia, left bundle branch block, recently hospitalized for acute cholecystitis status post ERCP, MRCP, and partial laparoscopic cholecystectomy, obesity due to excess calories.  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  Patient was on appropriate heart failure medications; however, currently being held due to symptomatic hypotension. Continue to monitor.  Established coronary artery disease without angina pectoris and status post CABG: Currently denies angina. We will consider reinitiation of metoprolol succinate 12.5 mg p.o. daily to prevent reoccurrence of NSVT. Continue statin therapy.  Persistent atrial fibrillation: Continue telemetry. Currently not on Toprol-XL due to orthostatic hypotension.  However it may consider restarting if he has recurrent episodes of NSVT or elevated resting heart rate. Continue oral anticoagulation.  High CHA2DS2-VASc score.  Orthostatic hypotension: Given chronic diarrhea prior to his partial cholecystectomy, multiple procedures ERCP/MRCP and surgeries CABG and recent partial cholecystectomy patient is overall oral intake has been significantly reduced and may have a degree of protein calorie malnutrition leading to  hypoalbuminemia leading to decreased oncotic pressures which facilitates third spacing. Patient's blood pressures then improved after wearing TED hose but remained orthostatic. Patient was placed on abdominal binder today and since then has orthostatics have been negative. We encouraged protein intake and keeping himself well-hydrated. 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 May consider checking morning cortisol level to screen for adrenal insufficiency.  Will defer to primary. May consider a trial of midodrine if needed as well. Would recommend holding home doses of olmesartan and amlodipine on discharge to prevent symptomatic hypotension and its precipitating sequelae.   Patient's questions and concerns were addressed to his satisfaction. He voices understanding of the instructions provided during this encounter.   This note was created using a voice recognition software as a result there may be grammatical errors inadvertently enclosed that do not reflect the nature of this encounter. Every attempt is made to correct such errors.  Tessa Lerner, DO, [facility] Office: [phone] 09/29/2020, 6:30pm

## 2020-09-29 NOTE — Progress Notes (Signed)
Central tele called this RN to report pt had 9 beats of V-tach. Pt is currently in Nuc Med. This RN called and spoke to [employer], [place], who reports pt states he is "doing well." Gearldine Bienenstock will continue to monitor pt while in nuc med. No other needs at this time.

## 2020-09-29 NOTE — NC FL2 (Signed)
[place] MEDICAID FL2 LEVEL OF CARE SCREENING TOOL     IDENTIFICATION  Patient Name: [name] Rice Birthdate: 1951-09-27 Sex: male Admission Date (Current Location): 09/23/2020  [facility] and [place] Number:  Producer, television/film/video and Address:  [facility],  [address] [place], [place] [address]      Provider Number: [id number]  Attending Physician Name and Address:  Merlene Laughter, DO  Relative Name and Phone Number:       Current Level of Care: Hospital Recommended Level of Care: Skilled Nursing Facility Prior Approval Number:    Date Approved/Denied:   PASRR Number: [id number]  Discharge Plan: SNF    Current Diagnoses: Patient Active Problem List   Diagnosis Date Noted  . Budding yeast in U/A detected 09/24/2020  . Generalized weakness 09/24/2020  . Nausea and vomiting 09/23/2020  . Right sided hydronephrosis with renal and ureteral calculus obstruction 09/23/2020  . Benign hypertension   . Abnormal magnetic resonance cholangiopancreatography (MRCP)   . Common bile duct (CBD) obstruction   . Elevated LFTs   . Choledocholithiasis with acute cholecystitis   . AKI (acute kidney injury) (HCC) 09/15/2020  . Sepsis (HCC) 09/15/2020  . Abdominal pain 09/15/2020  . Abnormal CT of the abdomen   . Abnormal liver enzymes   . Acute cholecystitis 09/14/2020  . Malnutrition of moderate degree (HCC) 08/07/2020  . Acute blood loss anemia 08/07/2020  . CAD, multiple vessel 08/07/2020  . 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 Postural dizziness with presyncope 07/24/2020  . Hypokalemia 07/24/2020  . Diarrhea 07/24/2020  . Lightheadedness 07/24/2020  . Long term current use of anticoagulant 01/14/2020  . Benign hypertension with CKD (chronic kidney disease) stage III 06/12/2019  . Pure hypercholesterolemia 06/12/2019  . Atrial fibrillation (HCC) 09/25/2016    Orientation RESPIRATION BLADDER Height & Weight     Self, Time, Situation, Place  Normal Continent  Weight: 109.7 kg Height:  6\' 2"  (188 cm)  BEHAVIORAL SYMPTOMS/MOOD NEUROLOGICAL BOWEL NUTRITION STATUS      Continent Diet  AMBULATORY STATUS COMMUNICATION OF NEEDS Skin   Extensive Assist Verbally Normal, Other (Comment) (JP drain (abdomen))                       Personal Care Assistance Level of Assistance  Bathing, Dressing, Total care Bathing Assistance: Maximum assistance   Dressing Assistance: Maximum assistance Total Care Assistance: Maximum assistance   Functional Limitations Info             SPECIAL CARE FACTORS FREQUENCY  PT (By licensed PT), OT (By licensed OT)     PT Frequency: 5x weekly OT Frequency: 5x weekly            Contractures Contractures Info: Not present    Additional Factors Info  Code Status, Allergies Code Status Info: full Allergies Info: NKDA           Current Medications (09/29/2020):  This is the current hospital active medication list Current Facility-Administered Medications  Medication Dose Route Frequency Provider Last Rate Last Admin  . acetaminophen (TYLENOL) tablet 650 mg  650 mg Oral Q6H PRN 10/01/2020, MD   650 mg at 09/27/20 2145   Or  . acetaminophen (TYLENOL) suppository 650 mg  650 mg Rectal Q6H PRN 2146, MD      . diphenhydrAMINE (BENADRYL) capsule 25 mg  25 mg Oral Q8H PRN Bobette Mo T, NP   25 mg at 09/28/20 2244  . diphenhydrAMINE-zinc acetate (  BENADRYL) 2-0.1 % cream   Topical BID PRN Marguerita Merles [place], DO   Given at 09/27/20 2145  . famotidine (PEPCID) tablet 20 mg  20 mg Oral BID Marguerita Merles [place], DO   20 mg at 09/29/20 0850  . feeding supplement (ENSURE ENLIVE / ENSURE PLUS) liquid 237 mL  237 mL Oral BID BM SheikhKateri Mc [place], DO   237 mL at 09/29/20 0851  . HYDROcodone-acetaminophen (NORCO/VICODIN) 5-325 MG per tablet 1 tablet  1 tablet Oral Q4H PRN Bobette Mo, MD      . HYDROmorphone (DILAUDID) injection 1 mg  1 mg Intravenous Q4H PRN Bobette Mo, MD       . magnesium oxide (MAG-OX) tablet 200 mg  200 mg Oral BID Tolia, Sunit, DO   200 mg at 09/29/20 0850  . melatonin tablet 6 mg  6 mg Oral QHS PRN Marikay Alar, FNP   6 mg at 09/28/20 2243  . multivitamin with minerals tablet 1 tablet  1 tablet Oral Daily Marguerita Merles [place], [place]   1 tablet at 09/29/20 0850  . ondansetron (ZOFRAN) tablet 4 mg  4 mg Oral Q6H PRN Bobette Mo, MD   4 mg at 09/25/20 0856   Or  . ondansetron [facility]) injection 4 mg  4 mg Intravenous Q6H PRN Bobette Mo, MD      . potassium chloride (KLOR-CON) packet 20 mEq  20 mEq Oral Daily Tolia, Sunit, DO   20 mEq at 09/29/20 0850  . rivaroxaban (XARELTO) tablet 20 mg  20 mg Oral Q supper Bobette Mo, MD   20 mg at 09/28/20 1818  . rosuvastatin (CRESTOR) tablet 40 mg  40 mg Oral QHS Sheikh, Kateri Mc Barry, DO   40 mg at 09/28/20 2244  . sodium chloride 0.9 % bolus 250 mL  250 mL Intravenous Once Sheikh, Kateri Mc Latif, DO      . tamsulosin [facility]) capsule 0.4 mg  0.4 mg Oral Daily Bobette Mo, MD   0.4 mg at 09/29/20 1607     Discharge Medications: Please see discharge summary for a list of discharge medications.  Relevant Imaging Results:  Relevant Lab Results:   Additional Information SSN [id number]  Armanda Heritage, RN

## 2020-09-29 NOTE — Evaluation (Signed)
Occupational Therapy RE-Evaluation Patient Details Name: [name] Rice MRN: [id number] DOB: 1951/09/01 Today's Date: 09/29/2020    History of Present Illness 69 yo male admitted with N/V, weakness, fall at home. 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 Recent d/c 09/22/2020   Clinical Impression   An occupational therapy re-evaluation was completed on this 69 year old, male. Patient is currently requiring assistance with ADLs including Min guard to Min assist with toileting, bathing, and LE dressing due to poor tolerance for standing ADLs with BP drop from 113/74 in recliner with HR of 77, to BP of 85/49 when standing with HR of 90 and symptoms inclusing dizziness, shortness of breath and blurred vision with intolerance to standing more than 1 min and need to sit down.  Pt recovered BP sitting upright in recliner to 98/59.  RN notified of BP changes.  [employer] AM-PAC "6-clicks" Daily Activity Inpatient Short Form score of 19/24 indicates 42.80% ADL impairment this session. Patient lives alone with no consistent available help or supervision.  Patient demonstrates good rehab potential, and should benefit from continued skilled occupational therapy services while in acute care to maximize safety, independence and quality of life at home.  Continued occupational therapy services in a SNF setting prior to return home is recommended.  ?     Follow Up Recommendations  SNF;Supervision/Assistance - 24 hour    Equipment Recommendations       Recommendations for Other Services       Precautions / Restrictions Precautions Precautions: Fall Precaution Comments: Orthostatic hypotension Restrictions Weight Bearing Restrictions: No      Mobility Bed Mobility               General bed mobility comments: Pt up in recliner.    Transfers Overall transfer level: Needs assistance Equipment used: None Transfers: Sit to/from Stand Sit to Stand: Min guard          General transfer comment: Pt performed 30 second Sit to Stand test, modified to allow use of UEs per pt preference. Pt scored 9 reps which is below the average for his gender and age, indicatging an increased risk of falls. Pt reported a score of 9/10 on the RPE after his Sit to stand test, indicating pt perceived this as  "very Hard activity" with [facility] noted during activity.    Balance Overall balance assessment: Needs assistance Sitting-balance support: No upper extremity supported;Feet supported Sitting balance-Leahy Scale: Good       Standing balance-Leahy Scale: Poor Standing balance comment: Poor due to onset of dizziness, blurred vision and need of Min guard for standing safety.                           ADL either performed or assessed with clinical judgement   ADL Overall ADL's : Needs assistance/impaired Eating/Feeding: Independent   Grooming: Set up;Sitting;Wash/dry face Grooming Details (indicate cue type and reason): Could not tolerate standing at sink due to symptomatic drop in BP with dizziness. Upper Body Bathing: Set up;Sitting   Lower Body Bathing: Min guard;Sit to/from stand;Sitting/lateral leans;Set up   Upper Body Dressing : Set up;Sitting;Supervision/safety     Lower Body Dressing Details (indicate cue type and reason): Setup for seated LE dressing with pt using figure 4 technique in recliner.  Min As with standing due to dizziness. Toilet Transfer: Supervision/safety;BSC;Regular Toilet;Grab bars   Toileting- Architect and Hygiene: Min guard;Sitting/lateral lean;Sit to/from stand  Functional mobility during ADLs: Min guard General ADL Comments: Unable to tolerate ambulating due to dizziness and drop in BP.     Vision Patient Visual Report: Blurring of vision Additional Comments: Pt reports that vision becomes blurred in standing when BP drops. Otherwise, no deficits.     Perception     Praxis      Pertinent Vitals/Pain  Pain Assessment: No/denies pain     Hand Dominance Right   Extremity/Trunk Assessment Upper Extremity Assessment Upper Extremity Assessment: Overall WFL for tasks assessed   Lower Extremity Assessment Lower Extremity Assessment: Defer to PT evaluation   Cervical / Trunk Assessment Cervical / Trunk Assessment: Normal   Communication Communication Communication: No difficulties   Cognition Arousal/Alertness: Awake/alert Behavior During Therapy: WFL for tasks assessed/performed Overall Cognitive Status: Within Functional Limits for tasks assessed                                     General Comments       Exercises     Shoulder Instructions      Home Living Family/patient expects to be discharged to:: Private residence Living Arrangements: Alone Available Help at Discharge: Friend(s);Available PRN/intermittently Type of Home: House Home Access: Stairs to enter [employer] of Steps: 2 Entrance Stairs-Rails: None Home Layout: One level     Bathroom Shower/Tub: IT trainer: Standard     Home Equipment: None   Additional Comments: Pt would benefit from 3:1.        Prior Functioning/Environment Level of Independence: Independent        Comments: Prior to heart surgery independent and ambualted out in communicty. Went to rehab after heart surgery - and was independent. Limited by persistent diarhea that kept him weak and close to a bathroom. Went home on 11/15 and reports medical issues caused him problems again and he couldn't get out of the chair.        OT Problem List: Decreased activity tolerance;Cardiopulmonary status limiting activity;Impaired balance (sitting and/or standing)      OT Treatment/Interventions: Self-care/ADL training;Therapeutic exercise;Therapeutic activities;Balance training;Patient/family education    OT Goals(Current goals can be found in the care plan section) Acute Rehab OT  Goals Patient Stated Goal: To increase stamina in order to care for himself at home. OT Goal Formulation: With patient Time For Goal Achievement: 10/13/20 Potential to Achieve Goals: Good ADL Goals Additional ADL Goal #2: Pt will perform 30 sec Sit to Stand test with score of 11+ and RPE no greater than 6/10 to demonstrate improved activity tolerance and decreased fall risk for home.  OT Frequency: Min 2X/week   Barriers to D/C:    No 24 hour assistance available.       Co-evaluation              AM-PAC OT "6 Clicks" Daily Activity     Outcome Measure Help from another person eating meals?: None Help from another person taking care of personal grooming?: A Little Help from another person toileting, which includes using toliet, bedpan, or urinal?: A Little Help from another person bathing (including washing, rinsing, drying)?: A Little Help from another person to put on and taking off regular upper body clothing?: A Little Help from another person to put on and taking off regular lower body clothing?: A Little 6 Click Score: 19   End of Session Equipment Utilized During Treatment: Gait belt Nurse Communication:  Mobility status (Blood pressure changes and pt reaction.)  Activity Tolerance: Patient tolerated treatment well Patient left: in chair;with call bell/phone within reach;with nursing/sitter in room  OT Visit Diagnosis: Muscle weakness (generalized) (M62.81);Dizziness and giddiness (R42)                Time: 6468-0321 OT Time Calculation (min): 23 min Charges:  OT General Charges $OT Visit: 1 Visit OT Evaluation $OT Re-eval: 1 Re-eval OT Treatments $Therapeutic Activity: 8-22 mins  Victorino Dike, OT Acute Rehab Services Office: [phone] 09/29/2020   Theodoro Clock 09/29/2020, 9:09 AM

## 2020-09-29 NOTE — Progress Notes (Addendum)
[facility] Progress Note     Subjective: CC-  Up in chair. No abdominal complaints today. Denies pain, nausea, vomiting. Tolerating diet. States that his appetite is good. Diarrhea has improved. States that he had 2 formed stools yesterday. LFTs essentially normal except alk phos 148. WBC 10.2, afebrile. No output recorded from JP drain, but there is a small amount of dark fluid suspicious for bile in the bulb this AM.  Brother is a PT in [place] so he thinks he may go to SNF in [place] once discharged.  Objective: Vital signs in last 24 hours: Temp:  [97.8 F (36.6 C)-98.6 F (37 C)] 97.8 F (36.6 C) (11/22 0359) Pulse Rate:  [81-90] 90 (11/22 0359) Resp:  [18-20] 20 (11/22 0359) BP: (92-120)/(61-71) 120/71 (11/22 0359) SpO2:  [97 %-100 %] 97 % (11/22 0359) Last BM Date: 09/28/20  Intake/Output from previous day: 11/21 0701 - 11/22 0700 In: 560 [P.O.:560] Out: 1610 [Urine:1610] Intake/Output this shift: No intake/output data recorded.  PE: Gen:  Alert, NAD, pleasant Pulm:  rate and effort normal Abd: Soft, NT/ND, +BS, lap incisions cdi without erythema or drainage, JP drain in place with dark possibly bilious fluid in bulb  Lab Results:  Recent Labs    09/28/20 0415 09/29/20 0434  WBC 9.0 10.2  HGB 10.5* 10.8*  HCT 34.1* 35.6*  PLT 292 289   BMET Recent Labs    09/28/20 0415 09/29/20 0434  NA 137 133*  K 3.5 4.0  CL 95* 96*  CO2 32 28  GLUCOSE 93 91  BUN 8 12  CREATININE 1.21 1.09  CALCIUM 7.9* 8.1*   PT/INR No results for input(s): LABPROT, INR in the last 72 hours. CMP     Component Value Date/Time   NA 133 (L) 09/29/2020 0434   NA 140 08/12/2020 0926   K 4.0 09/29/2020 0434   CL 96 (L) 09/29/2020 0434   CO2 28 09/29/2020 0434   GLUCOSE 91 09/29/2020 0434   BUN 12 09/29/2020 0434   BUN 18 08/12/2020 0926   CREATININE 1.09 09/29/2020 0434   CALCIUM 8.1 (L) 09/29/2020 0434   PROT 5.6 (L) 09/29/2020 0434   PROT 6.5 12/13/2019  0936   ALBUMIN 2.2 (L) 09/29/2020 0434   ALBUMIN 4.1 12/13/2019 0936   AST 36 09/29/2020 0434   ALT 24 09/29/2020 0434   ALKPHOS 148 (H) 09/29/2020 0434   BILITOT 0.8 09/29/2020 0434   BILITOT 1.8 (H) 12/13/2019 0936   GFRNONAA >60 09/29/2020 0434   GFRAA 97 08/12/2020 0926   Lipase     Component Value Date/Time   LIPASE 44 09/23/2020 1800       Studies/Results: DG CHEST PORT 1 VIEW  Result Date: 09/28/2020 CLINICAL DATA:  Shortness of breath EXAM: PORTABLE CHEST 1 VIEW COMPARISON:  September 26, 2020 FINDINGS: Calcified granuloma in the right base, unchanged. Small left effusion with underlying atelectasis. The cardiomediastinal silhouette is stable. No pneumothorax. No other changes. IMPRESSION: Small left effusion with underlying opacity, likely atelectasis. This finding is unchanged. No other changes. Electronically Signed   By: Dorise Bullion III M.D   On: 09/28/2020 09:25   ECHOCARDIOGRAM LIMITED  Result Date: 09/27/2020    ECHOCARDIOGRAM LIMITED REPORT   Patient Name:   LEMON STERNBERG Date of Exam: 09/27/2020 Medical Rec #:  [id number]      Height:       74.0 in Accession #:    [id number]     Weight:  241.8 lb Date of Birth:  [birth year]      BSA:          2.356 m Patient Age:    69 years       BP:           121/72 mmHg Patient Gender: M              HR:           96 bpm. Exam Location:  Inpatient Procedure: Limited Echo and Color Doppler Indications:     R06.9 DOE, CAD Native Vessel i25.0  History:         Patient has prior history of Echocardiogram examinations, most                  recent 07/30/2020. Prior CABG, Arrythmias:Atrial Fibrillation                  and LBBB; Risk Factors:Hypertension and Dyslipidemia.  Sonographer:     Raquel Sarna Senior RDCS Referring Phys:  [id number] Rex Kras Diagnosing Phys: Vernell Leep MD  Sonographer Comments: No IV access for definity at time of study. IMPRESSIONS  1. Left ventricular ejection fraction, by estimation, is 45 to 50%, although  evaluation limited due to poor visualization of the endocardium. The left ventricle has mildly decreased function. The left ventricle demonstrates regional wall motion abnormalities (see scoring diagram/findings for description). Left ventricular diastolic parameters are indeterminate.  2. Right ventricular systolic function is normal. The right ventricular size is normal.  3. Left atrial size was mildly dilated.  4. Right atrial size was mildly dilated.  5. Normal right atrial pressure.  6. Compared to previous study on 07/24/2020, post-op changes are new. FINDINGS  Left Ventricle: Left ventricular ejection fraction, by estimation, is 45 to 50%. The left ventricle has mildly decreased function. The left ventricle demonstrates regional wall motion abnormalities. Abnormal (paradoxical) septal motion consistent with post-operative status. Left ventricular diastolic parameters are indeterminate. Right Ventricle: The right ventricular size is normal. No increase in right ventricular wall thickness. Right ventricular systolic function is normal. Left Atrium: Left atrial size was mildly dilated. Right Atrium: Right atrial size was mildly dilated. Pericardium: There is no evidence of pericardial effusion. Mitral Valve: Mild mitral annular calcification. Tricuspid Valve: The tricuspid valve is grossly normal. Tricuspid valve regurgitation is not demonstrated. Aortic Valve: The aortic valve is grossly normal. Aortic valve regurgitation is not visualized. Aorta: The aortic root and ascending aorta are structurally normal, with no evidence of dilitation. Venous: The inferior vena cava was not well visualized. IAS/Shunts: No atrial level shunt detected by color flow Doppler. Vernell Leep MD Electronically signed by Vernell Leep MD Signature Date/Time: 09/27/2020/12:00:22 PM    Final     Anti-infectives: Anti-infectives (From admission, onward)   Start     Dose/Rate Route Frequency Ordered Stop   09/26/20 1000   fluconazole (DIFLUCAN) tablet 200 mg  Status:  Discontinued        200 mg Oral Daily 09/25/20 1355 09/26/20 1728   09/24/20 0800  piperacillin-tazobactam (ZOSYN) IVPB 3.375 g  Status:  Discontinued        3.375 g 12.5 mL/hr over 240 Minutes Intravenous Every 8 hours 09/24/20 0030 09/26/20 1151   09/24/20 0030  piperacillin-tazobactam (ZOSYN) IVPB 3.375 g        3.375 g 100 mL/hr over 30 Minutes Intravenous  Once 09/24/20 0006 09/24/20 0138   09/24/20 0015  fluconazole (DIFLUCAN) tablet 150 mg  Status:  Discontinued  Note to Pharmacy: Budding yeast urine with some urinary symptoms who will likely need to undergo cystoscopy.   150 mg Oral Daily 09/24/20 0010 09/25/20 1355       Assessment/Plan CAD- s/pCABGx 49/21/2021byDr. Z Atkins.  PAF-on Xarelto Hypertension Acute on chronic diastolic CHF Diarrhea- improved  Enlarging L pleural effusion Left-sided kidney stone at the ureteropelvic junction - no significant hydronephrosis but could be contributing to symptoms  Rash - per medicine, suspect 2/2 zosyn, improving  Infarcted gallbladder with cholelithiasis Choledocholithiasis S/p ERCP/sphincterotomy/balloon extraction biliary stones 09/17/2020 Dr. Lucio Edward POD#11,S/p laparoscopic partial cholecystectomy/drain placement 09/18/2020 Dr. Alphonsa Overall - JP drain now with some dark output suspicious for possible bile leak vs old blood. Seems to be low volume. If this is a bile leak it is contained with the drain. Abdominal exam benign and patient is tolerating a diet. Continue drain to bulb suction and monitor output.  Will check HIDA scan.  FEN: HH diet ID: was discharged on PO augmentin; PO fluconazole 11/17>>, Zosyn 11/17>>  Completed 7 days of post abx from gb standpoint.  No further needed. [id number] Follow-up:Dr. Lucia Gaskins 12/1 at 11am   LOS: 5 days    Wellington Hampshire, [facility] 09/29/2020, 9:15 AM Please see Amion for pager number  during day hours 7:00am-4:30pm

## 2020-09-30 DIAGNOSIS — R112 Nausea with vomiting, unspecified: Secondary | ICD-10-CM | POA: Diagnosis not present

## 2020-09-30 DIAGNOSIS — I10 Essential (primary) hypertension: Secondary | ICD-10-CM | POA: Diagnosis not present

## 2020-09-30 DIAGNOSIS — D62 Acute posthemorrhagic anemia: Secondary | ICD-10-CM | POA: Diagnosis not present

## 2020-09-30 DIAGNOSIS — Z9889 Other specified postprocedural states: Secondary | ICD-10-CM

## 2020-09-30 DIAGNOSIS — R0600 Dyspnea, unspecified: Secondary | ICD-10-CM

## 2020-09-30 DIAGNOSIS — I4811 Longstanding persistent atrial fibrillation: Secondary | ICD-10-CM | POA: Diagnosis not present

## 2020-09-30 DIAGNOSIS — J90 Pleural effusion, not elsewhere classified: Secondary | ICD-10-CM

## 2020-09-30 LAB — CBC WITH DIFFERENTIAL/PLATELET
Abs Immature Granulocytes: 0.08 K/uL — ABNORMAL HIGH (ref 0.00–0.07)
Basophils Absolute: 0.1 K/uL (ref 0.0–0.1)
Basophils Relative: 1 %
Eosinophils Absolute: 1 K/uL — ABNORMAL HIGH (ref 0.0–0.5)
Eosinophils Relative: 12 %
HCT: 35.2 % — ABNORMAL LOW (ref 39.0–52.0)
Hemoglobin: 10.5 g/dL — ABNORMAL LOW (ref 13.0–17.0)
Immature Granulocytes: 1 %
Lymphocytes Relative: 18 %
Lymphs Abs: 1.6 K/uL (ref 0.7–4.0)
MCH: 28 pg (ref 26.0–34.0)
MCHC: 29.8 g/dL — ABNORMAL LOW (ref 30.0–36.0)
MCV: 93.9 fL (ref 80.0–100.0)
Monocytes Absolute: 1 K/uL (ref 0.1–1.0)
Monocytes Relative: 11 %
Neutro Abs: 5 K/uL (ref 1.7–7.7)
Neutrophils Relative %: 57 %
Platelets: 294 K/uL (ref 150–400)
RBC: 3.75 MIL/uL — ABNORMAL LOW (ref 4.22–5.81)
RDW: 17.2 % — ABNORMAL HIGH (ref 11.5–15.5)
WBC: 8.7 K/uL (ref 4.0–10.5)
nRBC: 0 % (ref 0.0–0.2)

## 2020-09-30 LAB — COMPREHENSIVE METABOLIC PANEL WITH GFR
ALT: 27 U/L (ref 0–44)
AST: 39 U/L (ref 15–41)
Albumin: 2.2 g/dL — ABNORMAL LOW (ref 3.5–5.0)
Alkaline Phosphatase: 139 U/L — ABNORMAL HIGH (ref 38–126)
Anion gap: 8 (ref 5–15)
BUN: 11 mg/dL (ref 8–23)
CO2: 28 mmol/L (ref 22–32)
Calcium: 8.1 mg/dL — ABNORMAL LOW (ref 8.9–10.3)
Chloride: 97 mmol/L — ABNORMAL LOW (ref 98–111)
Creatinine, Ser: 1.02 mg/dL (ref 0.61–1.24)
GFR, Estimated: >60 mL/min
Glucose, Bld: 94 mg/dL (ref 70–99)
Potassium: 4.4 mmol/L (ref 3.5–5.1)
Sodium: 133 mmol/L — ABNORMAL LOW (ref 135–145)
Total Bilirubin: 1 mg/dL (ref 0.3–1.2)
Total Protein: 5.7 g/dL — ABNORMAL LOW (ref 6.5–8.1)

## 2020-09-30 LAB — PHOSPHORUS: Phosphorus: 2.7 mg/dL (ref 2.5–4.6)

## 2020-09-30 LAB — MAGNESIUM: Magnesium: 2.3 mg/dL (ref 1.7–2.4)

## 2020-09-30 MED ORDER — MIDODRINE HCL 5 MG PO TABS
5.0000 mg | ORAL_TABLET | Freq: Three times a day (TID) | ORAL | Status: DC
  Administered 2020-09-30 – 2020-10-01 (×5): 5 mg via ORAL
  Filled 2020-09-30 (×5): qty 1

## 2020-09-30 MED ORDER — SODIUM CHLORIDE 0.9 % IV BOLUS
250.0000 mL | Freq: Once | INTRAVENOUS | Status: AC
  Administered 2020-09-30: 250 mL via INTRAVENOUS

## 2020-09-30 NOTE — Progress Notes (Addendum)
PROGRESS NOTE    [name] Rice  ZDG:[id number] DOB: 1951-05-12 DOA: 09/23/2020 PCP: [name], PA-C   Brief Narrative:  HPI per [name] on 09/23/20 [name] Rice is a 69 y.o. male with medical history significant of chronic atrial fibrillation, CAD, s/p CABG almost 2 months ago, LBBB, chronic systolic heart failure of 45 to 50% per recent echocardiogram who was recently admitted and discharged from 09/14/2020 to 09/22/2020 for acute cholecystitis requiring ERCP with successful extraction of a stone, followed by partial laparoscopic cholecystectomy with JP drainage placement who returns to the hospital due to generalized weakness associated with decreased oral intake, abdominal pain, nausea, 2 episodes of emesis and a history of a mechanical fall at home without any significant injury. He states his diarrhea has gotten better in the past few days. He denies fever, but complains of chills and fatigue. He denies rhinorrhea, sore throat, wheezing or hemoptysis. No chest pain, palpitations, diaphoresis, PND or orthopnea. He gets occasional lower extremity edema, but states that this has improved significantly. He denies constipation, melena or hematochezia. He has had mild dysuria along with right flank pain, but no frequency, oliguria or hematuria. No polyuria, polydipsia, polyphagia or blurred vision.  ED Course: Initial vital signs were temperature 97.8 F, pulse 86, respirations 16, blood pressure 143/99 mmHg O2 sat 98% on room air.  Labs: Coronavirus and influenza PCR was negative.  Urine analysis showed large hemoglobinuria with more than 50 RBC microscopic examination.  Ketonuria 5 and proteinuria 30 mg/dL.  There was trace leukocyte esterase with 21-50 WBCs per hpf.  There were a few bacteria, mucus, but engaged and hyaline casts.  Imaging: 1 view chest radiograph shows enlarging left pleural effusion likely reflecting passive atelectasis though underlying infection or edema  could not be fully excluded.  There is vascular congestion and grading opacity in the right lung which is favored to be a combination of atelectasis and edema.  There is trace right pleural effusion as well.  CT abdomen/pelvis with contrast shows status post cholecystectomy with ill-defined fluid and edema in the operative bed.  Considerations include phlegmon, developing abscess, biloma and postoperative hematoma.  Development of right-sided urinary tract obstruction secondary to a punctate mid right ureteric stone treated close to left-sided stone at the ureteropelvic junction without significant hydronephrosis.  There is atelectasis.  There is fat and right side of the bladder within an inguinal hernia.  Please see images and full radiology report.  **Interim History  His diet was advanced to clear liquid diet and will go to soft diet.  Dietitian was consulted for further evaluation we will add probiotics.  Patient states that he feels generally weak but he is improving.  Further work-up done and he had a thoracentesis today which yielded 1.85 L and he was given a dose of IV Lasix for diuresis.  General surgery does not feel he has a bile leak and they are recommending continuing drain output.  PT OT to reevaluate today and urology evaluation still pending but Dr. [name] recommended starting the patient on tamsulosin and having follow-up in outpatient setting as he did not feel like any surgical intervention is warranted for his ureteral stone.  Patient was given a dose of IV Lasix on 09/25/2020 and will be given another dose today given his volume overload.  Likely has acute on chronic diastolic CHF.  Cardiology is consulted for further evaluation recommendations.  His antibiotics have now stopped and patient has a rash that has developed  we will provide Benadryl.  And monitor off of antibiotics.  We will also stop antifungals given his negative growth and urine culture.  On 09/26/2020 cardiology was  consulted and changed the patient to IV Bumex 1 mg twice daily and then today has been changed to p.o. Lasix 40 mg daily.  Cardiology feels that his shortness of breath is multifactorial and is improving.  Patient continues to have some lightheadedness and cardiology recommending possibly stopping or reducing the metoprolol.  Patient's rash overnight got little bit worse so we will start him on H2 blocker blocker  09/28/20: Patient's rash is improving on famotidine but today it looks like he is over diuresed as he would not change from his IV Bumex to p.o. Lasix.  I have discontinued IV Bumex and his blood pressure was optimal.  Underwent orthostatic bowel sounds and was orthostatic and dizzy.  We will hold his metoprolol and his Lasix and give him a small to 50 bolus and have ordered him a abdominal binder.  09/29/2020: Patient still felt lightheaded and dizzy despite his antihypertensives and his Lasix being held.  Will be given another 250 bolus and have ordered him an abdominal binder and TED hose.  He had the TED hose and his orthostatic vital signs did drop this morning when he got from the bed to the chair.  Because the color of his output has JP drain change there is concern for bile leak so general surgery will be ordering a NM HIDA scan to assess for Bile leak.  OT reevaluated and recommending SNF now.  09/30/20: Continues to be Orthostatic. Will order Midodrine 5 mg po TID and will give another bolus. TOC assisting with placement in SNF in [place]. No bile leak was found on HIDA Scan. Cardiology considering reinitiating Metoprolol Succinate 12.5 mg po Daily to prevent Reoccurrence of NSVT.  Assessment & Plan:   Principal Problem:   Nausea and vomiting Active Problems:   Atrial fibrillation (HCC)   Pure hypercholesterolemia   Malnutrition of moderate degree (HCC)   Acute blood loss anemia   CAD, multiple vessel   Benign hypertension   Right sided hydronephrosis with renal and ureteral  calculus obstruction   Budding yeast in U/A detected   Generalized weakness   Dyspnea   Pleural effusion   S/P thoracentesis  Mild Right sided hydronephrosis with renal and ureteral calculus obstruction and ? UTI poA -Continue IV hydration and reduce the Rate to 75 mL/hr; Has development of Right Sideded Urinary Tract Obstruction due to Punctate Mid Ureteric Stone -Tamsulosin 0.4 mg p.o. twice daily and will change to Daily  -Discussed with Urology Dr. Gwynneth Macleod and he will evaluate eventually  -Leukocytosis on admission of 11.6 improved to 8.7 -patient's urinalysis showed a hazy appearance with large hemoglobin, 5 ketones, trace leukocytes, negative nitrites, few bacteria, greater than 50 RBCs per high-power field, and 21-50 WBCs; urine culture still pending but was obtained after he is placed on antibiotics; urine culture showed no growth so we will stop antibiotics -Patient is also started on p.o. fluconazole and will now discontinue -Antibiotics have now been discontinued and general surgery feels that he does not need more antibiotics from a gallbladder standpoint; patient did develop a rash likely in the setting of his antibiotics-we will need to continue to monitor and watch off of antibiotics  Generalized weakness and poor p.o. intake -Was In the setting of dehydration and poor p.o. intake and recent gallbladder issues and possible UTI -IV fluid hydration is  now stopped and he had a worsened left-sided pleural effusion.  He was given a dose of IV Lasix and had a thoracentesis.  Cardiology evaluated and changed his Lasix to IV Bumex.  IV Bumex was supposed to be changed to p.o. Lasix yesterday but unfortunately did not get done and IV Bumex was given yesterday.  This was discontinued and changed to p.o. Lasix this morning however patient's blood pressure was on the low side so p.o. Lasix was held -PT OT to further evaluate and treat and initially were recommending home health but now have  escalated his level of care to skilled nursing facility and will reach out to the transition of care management to help assistance with placement -Recommend OOB to Chair -Patient continues to be significantly orthostatic so we will continue to hold metoprolol furosemide in order 250 mL bolus in addition to his TED hose and his abdominal binder.   -Po Intake is improving but still very dizzy   Budding yeast in U/A detected -Will treat given symptoms and possible need for urological procedure; urology feels that no surgical intervention is warranted at this time but they will come evaluate the patient later on -Fluconazole now stopped  Fluid and Edema in the Operative Bed Recently infected gallbladder with cholelithiasis status post partial cholecystectomy and drain placement done on 09/18/2020 Choledocholithiasis status post ERCP/sphincterotomy/balloon extraction of the biliary stones -? Phlegmon/Developing Abscess, Biloma, Postoperative Hematoma -Will discuss with General Surgery and they do not feel that he has an abscess and feel that the findings were a posterior wall of his gallbladder -Antibiotics had stopped them because of the color of his output his JP drain changed General surgery is concerned for bile leak versus old blood -Patient has no issues with eating and denies any abdominal pain but general surgery is checking HIDA scan; HIDA was Negative  -General Surgery Now going to Discontinue his JP Drain and have signed off the Case -Recommending following up with Dr. Ezzard Standing as an outpatient within 2 weeks.  Atrial fibrillation, chronic (HCC) -CHA?DS?-VASc Score of at least 4. -S/p Maze Procedure on 07/29/20 -Continued Metoprolol 12.5 mg p.o. daily I have now discontinued given his orthostatics and will continue to hold off for now -Continue Xarelto for anticoagulation. -Continue to monitor on telemetry  Acquired Thrombophilia -Think of his atrial fibrillation -Patient's  CHA2DS2-VASc is 4 -Continue with anticoagulation with Xarelto  Pure Hypercholesterolemia -Hold Rosuvastatin while on fluconazole and resume now that fluconazole is being discontinued  Malnutrition of moderate degree (HCC) -Dietitian was consulted for further evaluation recommendations -They are recommending Ensure Enlive p.o. twice daily -Continue with multivitamin with minerals daily -Diet has been advanced from a clear liquid diet to soft -We will add a probiotic as well  Normocytic Anemia -In the setting of 2 recent surgeries. -Hemoglobin higher than baseline and is slowly trending down as hemoglobin/hematocrit went from 12.0/39.2 -> 11.3/36.2 -> 10.0/33.2 -> 10.7/35.6 -> 11.5/38.2 and today is back down to 10.8/35.6 -> 10.5/35.2 -Continue monitor hemoglobin/hematocrit and monitor for signs and symptoms of bleeding as he is anticoagulated; currently no overt bleeding noted -Repeat CBC in a.m.  CAD, multiple vessel -Holding beta-blocker due to Hypotension and Xarelto. -Holding a statin while using fluconazole but since fluconazole has been stopped we will Resume Statin now  Benign Hypertension -> Hypotension;, continues to be hypotensive -He has been holding amlodipine and olmesartan. -Continued Metoprolol 12.5 mg p.o. daily but stopped now given continued Orthostasis  -Monitor blood pressure and heart rate. -Blood pressures  were betterat 120/69 but is lightheaded and dizzy when standing  -Continue monitor blood pressure per protocol  Orthostatic Hypotension, persistent and significant again -Was lightheaded and Dizzy upon standing again; Per Nursing "His sitting pressure was 113/74, standing he dropped to 85/49. Upon sitting back down about 2 minutes later he was 129/74, and he just called the CNA to check it again because he felt it had dropped again and he is currently at 93/65" -BP was on the lower side at 93/65 and he did drop today: -Given a 250 mL bolus 3 days in a  row -Continue Hold Metoprol and Lasix -Abdominal Binder and TED Hose -Start Midodrine 5 mg po TID  -Repeat Orthostatic VS in the AM  -C/w PT/OT efforts   Left-Sided Pleural Effusion, poA and improved -IV Lasix has now been discontinued -Patient is -4.073 L since admission -Ordered a thoracentesis to be done and he had a left-sided thoracentesis which yielded 1.85 L of fluid -Ordered a Fluid analysis on the pleural fluid and showed a red color with 1109 total nucleated cell count, 50 lymphs, turbid appearance, 43 monocytes, with Gram stain WBCs and no organisms seen currently -Repeat chest x-ray 11/21 showed "Small left effusion with underlying opacity, likely atelectasis. This finding is unchanged. No other changes" -Will add Flutter Valve and Incentive Spirometry  -Repeat CXR in the AM   Acute on Chronic Diastolic CHF, improved and now overdiuresed  -Chest x-ray a few days ago showed "Prior CABG. Left atrial appendage clip in stable position. Stable cardiomegaly. Bibasilar interstitial prominence noted on today's exam. Persistent left-sided pleural effusion. Findings suggest CHF." -CXR from today as above -Diuresis is now been discontinued given his orthostatics -strict I's and O's, daily weights -BNP 151.4 -Repeat ECHO done and showed "Left Ventricle: Left ventricular ejection fraction, by estimation, is 45 to 50%. The left ventricle has mildly decreased function. The left ventricle demonstrates regional wall motion abnormalities. Abnormal (paradoxical) septal motion consistent with post-operative status. Left ventricular diastolic parameters are indeterminate."  -We will no longer fluid restrict to 1500 mL -Diuresis has been held given continued orthostatic hypotension  -Cardiology is consulted for further evaluation recommendations and recommending holding Lasix and Metoprolol  -Continue to monitor for signs and symptoms of volume overload and repeat chest x-ray in a.m. -Follow-up on  cardiology recommendations and will repeat a CMP in the AM   Rash, Improving significantly almost resolved -Diffuse whole body rash could be from contact dermatitis or medication induced -Have stopped antibiotics, PPI, and Probiotics -Provide the patient with Benadryl cream -Will start H2 Blocker BID IV and if still persists consider steroids; Rash is improving and will hold off on Steroids  -Likely from Zosyn which has been stopped   Hyponatremia -Had improved to 137 is now back down to 133 again today  -Currently holding diuresis -Repeat CMP in a.m.   Obesity -Complicates overall prognosis and Care -Estimated body mass index is 31.05 kg/m as calculated from the following:   Height as of this encounter: 6\' 2"  (1.88 m).   Weight as of this encounter: 109.7 kg. -Weight Loss and Dietary Counseling given   DVT prophylaxis: Anticoagulated with Xarelto Code Status: FULL CODE  Family Communication: No family present at bedside Disposition Plan: Pending further clinical improvement and tolerance of p.o. diet and evaluation by PT and OT; Will need Repeat Orthostatics in the AM as he was orthostatic again today;  Status is: Inpatient  Remains inpatient appropriate because:Unsafe d/c plan, IV treatments appropriate due to  intensity of illness or inability to take PO and Inpatient level of care appropriate due to severity of illness   Dispo: The patient is from: Home              Anticipated d/c is to: SNF              Anticipated d/c date is: 1-2 days              Patient currently is not medically stable to d/c.  Consultants:   Urology  General Surgery   Cardiology  IR for Thoracentesis   Procedures: Successful image-guided left thoracentesis. Yielded 1.85 liters of dark red fluid. Procedure was stopped after 1.85 L secondary to patient's BP (78/40, repeat 85/49)- at that time, patient stated he was lightheaded with blurred vision. He was placed in RT with improvement of BP  (92/51, repeat 106/56) and resolution of symptoms. Patient tolerated procedure well. No immediate complications. EBL < 1 mL.  Antimicrobials:  Anti-infectives (From admission, onward)   Start     Dose/Rate Route Frequency Ordered Stop   09/26/20 1000  fluconazole (DIFLUCAN) tablet 200 mg  Status:  Discontinued        200 mg Oral Daily 09/25/20 1355 09/26/20 1728   09/24/20 0800  piperacillin-tazobactam (ZOSYN) IVPB 3.375 g  Status:  Discontinued        3.375 g 12.5 mL/hr over 240 Minutes Intravenous Every 8 hours 09/24/20 0030 09/26/20 1151   09/24/20 0030  piperacillin-tazobactam (ZOSYN) IVPB 3.375 g        3.375 g 100 mL/hr over 30 Minutes Intravenous  Once 09/24/20 0006 09/24/20 0138   09/24/20 0015  fluconazole (DIFLUCAN) tablet 150 mg  Status:  Discontinued       Note to Pharmacy: Budding yeast urine with some urinary symptoms who will likely need to undergo cystoscopy.   150 mg Oral Daily 09/24/20 0010 09/25/20 1355        Subjective: Seen and examined at bedside he he thinks he is feeling a little bit better and states that he has a better appetite.  States that his blood pressure was little bit better wearing abdominal binder but when he stood up again later this afternoon he was orthostatic with physical therapy.  No nausea or vomiting.  Surgery evaluated and remove the drain.  We will continue to hold his antihypertensives.  Patient still wanted to go to [facility] for rehabilitation.  No other concerns or plans at this time.  Objective: Vitals:   09/30/20 0500 09/30/20 0535 09/30/20 0600 09/30/20 1221  BP:  120/69    Pulse:  90    Resp: (!) 25 16 18    Temp:  97.7 F (36.5 C)    TempSrc:  Oral    SpO2:  96%  98%  Weight:      Height:        Intake/Output Summary (Last 24 hours) at 09/30/2020 1228 Last data filed at 09/30/2020 0830 Gross per 24 hour  Intake 790 ml  Output 1210 ml  Net -420 ml   Filed Weights   09/24/20 0130  Weight: 109.7 kg    Examination: Physical Exam:  Constitutional: WN/WD obese Caucasian male in NAD and appears calm and comfortable Eyes: Lids and conjunctivae normal, sclerae anicteric  ENMT: External Ears, Nose appear normal. Grossly normal hearing.   Neck: Appears normal, supple, no cervical masses, normal ROM, no appreciable thyromegaly; no JVD Respiratory: Diminished to auscultation bilaterally at the bases, no wheezing, rales, rhonchi  or crackles. Normal respiratory effort and patient is not tachypenic. No accessory muscle use.  Cardiovascular: RRR, no murmurs / rubs / gallops. S1 and S2 auscultated. No extremity edema. 2+ pedal pulses. No carotid bruits.  Abdomen: Soft, non-tender, Distended. Bowel sounds positive. Has an Abdominal Binder on today; JP Drain not evaluated  GU: Deferred. Musculoskeletal: No clubbing / cyanosis of digits/nails. No joint deformity upper and lower extremities Skin: Rash is significantly improved. Has some seborrheic keratosis; no ulcers.  Neurologic: CN 2-12 grossly intact with no focal deficits. Romberg sign and cerebellar reflexes not assessed.  Psychiatric: Normal judgment and insight. Alert and oriented x 3. Normal mood and appropriate affect.   Data Reviewed: I have personally reviewed following labs and imaging studies  CBC: Recent Labs  Lab 09/26/20 0543 09/27/20 0509 09/28/20 0415 09/29/20 0434 09/30/20 0417  WBC 10.3 9.7 9.0 10.2 8.7  NEUTROABS 6.6 5.8 4.9 5.7 5.0  HGB 10.7* 11.5* 10.5* 10.8* 10.5*  HCT 35.6* 38.2* 34.1* 35.6* 35.2*  MCV 93.4 92.9 92.7 92.7 93.9  PLT 292 289 292 289 294   Basic Metabolic Panel: Recent Labs  Lab 09/26/20 0543 09/27/20 0509 09/28/20 0415 09/29/20 0434 09/30/20 0417  NA 133* 135 137 133* 133*  K 3.5 3.6 3.5 4.0 4.4  CL 100 96* 95* 96* 97*  CO2 27 27 32 28 28  GLUCOSE 89 108* 93 91 94  BUN <5* CREATININE 0.95 1.06 1.21 1.09 1.02  CALCIUM 7.9* 8.0* 7.9* 8.1* 8.1*  MG 1.9 1.9 2.0 2.0 2.3  PHOS 2.8  2.9 3.2 2.5 2.7   GFR: Estimated Creatinine Clearance: 92.6 mL/min (by C-G formula based on SCr of 1.02 mg/dL). Liver Function Tests: Recent Labs  Lab 09/26/20 0543 09/27/20 0509 09/28/20 0415 09/29/20 0434 09/30/20 0417  AST 36 39  ALT ALKPHOS 153* 165* 152* 148* 139*  BILITOT 1.0 0.9 0.7 0.8 1.0  PROT 5.1* 5.5* 5.4* 5.6* 5.7*  ALBUMIN 2.0* 2.3* 2.1* 2.2* 2.2*   Recent Labs  Lab 09/23/20 1800  LIPASE 44   No results for input(s): AMMONIA in the last 168 hours. Coagulation Profile: No results for input(s): INR, PROTIME in the last 168 hours. Cardiac Enzymes: No results for input(s): CKTOTAL, CKMB, CKMBINDEX, TROPONINI in the last 168 hours. BNP (last 3 results) No results for input(s): PROBNP in the last 8760 hours. HbA1C: No results for input(s): HGBA1C in the last 72 hours. CBG: No results for input(s): GLUCAP in the last 168 hours. Lipid Profile: No results for input(s): CHOL, HDL, LDLCALC, TRIG, CHOLHDL, LDLDIRECT in the last 72 hours. Thyroid Function Tests: No results for input(s): TSH, T4TOTAL, FREET4, T3FREE, THYROIDAB in the last 72 hours. Anemia Panel: No results for input(s): VITAMINB12, FOLATE, FERRITIN, TIBC, IRON, RETICCTPCT in the last 72 hours. Sepsis Labs: No results for input(s): PROCALCITON, LATICACIDVEN in the last 168 hours.  Recent Results (from the past 240 hour(s))  Resp Panel by RT PCR (RSV, Flu A&B, Covid) - Nasopharyngeal Swab     Status: None   Collection Time: 09/23/20  8:01 PM   Specimen: Nasopharyngeal Swab  Result Value Ref Range Status   SARS Coronavirus 2 by RT PCR NEGATIVE NEGATIVE Final    Comment: (NOTE) SARS-CoV-2 target nucleic acids are NOT DETECTED.  The SARS-CoV-2 RNA is generally detectable in upper respiratoy specimens during the acute phase of infection. The lowest concentration of SARS-CoV-2 viral copies this assay can detect  is 131 copies/mL. A negative result does not preclude  SARS-Cov-2 infection and should not be used as the sole basis for treatment or other patient management decisions. A negative result may occur with  improper specimen collection/handling, submission of specimen other than nasopharyngeal swab, presence of viral mutation(s) within the areas targeted by this assay, and inadequate number of viral copies (<131 copies/mL). A negative result must be combined with clinical observations, patient history, and epidemiological information. The expected result is Negative.  Fact Sheet for Patients:  https://www.moore.com/  Fact Sheet for Healthcare Providers:  https://www.young.biz/  This test is no t yet approved or cleared by the [place] FDA and  has been authorized for detection and/or diagnosis of SARS-CoV-2 by FDA under an Emergency Use Authorization (EUA). This EUA will remain  in effect (meaning this test can be used) for the duration of the COVID-19 declaration under Section 564(b)(1) of the Act, 21 U.S.C. section 360bbb-3(b)(1), unless the authorization is terminated or revoked sooner.     Influenza A by PCR NEGATIVE NEGATIVE Final   Influenza B by PCR NEGATIVE NEGATIVE Final    Comment: (NOTE) The Xpert Xpress SARS-CoV-2/FLU/RSV assay is intended as an aid in  the diagnosis of influenza from Nasopharyngeal swab specimens and  should not be used as a sole basis for treatment. Nasal washings and  aspirates are unacceptable for Xpert Xpress SARS-CoV-2/FLU/RSV  testing.  Fact Sheet for Patients: https://www.moore.com/  Fact Sheet for Healthcare Providers: https://www.young.biz/  This test is not yet approved or cleared by the [place] FDA and  has been authorized for detection and/or diagnosis of SARS-CoV-2 by  FDA under an Emergency Use Authorization (EUA). This EUA will remain  in effect (meaning this test can be used) for the duration of the   Covid-19 declaration under Section 564(b)(1) of the Act, 21  U.S.C. section 360bbb-3(b)(1), unless the authorization is  terminated or revoked.    Respiratory Syncytial Virus by PCR NEGATIVE NEGATIVE Final    Comment: (NOTE) Fact Sheet for Patients: https://www.moore.com/  Fact Sheet for Healthcare Providers: https://www.young.biz/  This test is not yet approved or cleared by the [place] FDA and  has been authorized for detection and/or diagnosis of SARS-CoV-2 by  FDA under an Emergency Use Authorization (EUA). This EUA will remain  in effect (meaning this test can be used) for the duration of the  COVID-19 declaration under Section 564(b)(1) of the Act, 21 U.S.C.  section 360bbb-3(b)(1), unless the authorization is terminated or  revoked. Performed at [facility], [address], [place], [place] [address]   Culture, Urine     Status: None   Collection Time: 09/24/20  2:00 PM   Specimen: Urine, Random  Result Value Ref Range Status   Specimen Description   Final    URINE, RANDOM Performed at [facility], [address] [address], [place], [place] [address]    Special Requests   Final    NONE Performed at [facility], [address], [place], [place] [address]    Culture   Final    NO GROWTH Performed at [facility], [address] [place], [place] [address]    Report Status 09/25/2020 FINAL  Final  Body fluid culture     Status: None   Collection Time: 09/25/20 11:24 AM   Specimen: PATH Cytology Pleural fluid  Result Value Ref Range Status   Specimen Description   Final    PLEURAL LT Performed at  [facility], [address] [place], [place] [address]    Special Requests   Final    NONE Performed at [facility], [address] [place], [place] [address]    Gram Stain   Final    FEW WBC PRESENT, PREDOMINANTLY  MONONUCLEAR NO ORGANISMS SEEN    Culture   Final    NO GROWTH 3 DAYS Performed at [facility], [address] [place], [place] [address]    Report Status 09/28/2020 FINAL  Final     RN Pressure Injury Documentation:     Estimated body mass index is 31.05 kg/m as calculated from the following:   Height as of this encounter: 6\' 2"  (1.88 m).   Weight as of this encounter: 109.7 kg.  Malnutrition Type: Nutrition Problem: Inadequate oral intake Etiology: poor appetite, other (see comment) Malnutrition Characteristics: Signs/Symptoms: per patient/family report Nutrition Interventions: Interventions: Ensure Enlive (each supplement provides 350kcal and 20 grams of protein), MVI  Radiology Studies: NM HEPATO BILIARY LEAK  Result Date: 09/29/2020 CLINICAL DATA:  Upper abdominal pain, recurrent status post cholecystectomy. EXAM: NUCLEAR MEDICINE HEPATOBILIARY IMAGING TECHNIQUE: Sequential images of the abdomen were obtained out to 60 minutes following intravenous administration of radiopharmaceutical. RADIOPHARMACEUTICALS:  5.2 mCi Tc-3977m  Choletec IV COMPARISON:  CT abdomen and pelvis of September 23, 2020 FINDINGS: Prompt uptake of activity seen in the liver with excretion into the common bile duct and beyond into the small bowel. No sign of leak. Imaging was performed for total of 2 hours. Faint activity along the RIGHT lateral aspect of the patient is related to the IV. IMPRESSION: No sign of biliary leak or obstruction Electronically Signed   By: Donzetta KohutGeoffrey  Wile M.D.   On: 09/29/2020 16:42   Scheduled Meds: . famotidine  20 mg Oral BID  . feeding supplement  237 mL Oral BID BM  . magnesium oxide  200 mg Oral BID  . midodrine  5 mg Oral TID WC  . multivitamin with minerals  1 tablet Oral Daily  . potassium chloride  20 mEq Oral Daily  . rivaroxaban  20 mg Oral Q supper  . rosuvastatin  40 mg Oral QHS  . tamsulosin  0.4 mg Oral Daily   Continuous Infusions: . sodium chloride       LOS: 6 days    Merlene Laughtermair Latif Jaxsen Bernhart, DO [facility] PAGER is on AMION  If 7PM-7AM, please contact night-coverage [email or web address]

## 2020-09-30 NOTE — Progress Notes (Signed)
[facility] Progress Note     Subjective: CC-  Up in chair. Feeling well today. Tolerating diet. Denies abdominal pain, n/v. BM yesterday. Diarrhea resolved.  HIDA negative for bile leak. JP drain with only 10cc dark drainage last 24 hours.  Objective: Vital signs in last 24 hours: Temp:  [97.7 F (36.5 C)-98.4 F (36.9 C)] 97.7 F (36.5 C) (11/23 0535) Pulse Rate:  [76-91] 90 (11/23 0535) Resp:  [16-25] 18 (11/23 0600) BP: (93-136)/(65-75) 120/69 (11/23 0535) SpO2:  [96 %-97 %] 96 % (11/23 0535) Last BM Date: 09/29/20 (per pt)  Intake/Output from previous day: 11/22 0701 - 11/23 0700 In: 1030 [P.O.:780; IV Piggyback:250] Out: 1810 [Urine:1800; Drains:10] Intake/Output this shift: No intake/output data recorded.  PE: Gen:  Alert, NAD, pleasant Pulm:  rate and effort normal Abd: Soft, NT/ND, +BS, lap incisions cdi without erythema or drainage, JP drain in place with trace dark fluid in bulb - likely old blood given negative HIDA  Lab Results:  Recent Labs    09/29/20 0434 09/30/20 0417  WBC 10.2 8.7  HGB 10.8* 10.5*  HCT 35.6* 35.2*  PLT 289 294   BMET Recent Labs    09/29/20 0434 09/30/20 0417  NA 133* 133*  K 4.0 4.4  CL 96* 97*  CO2 28 28  GLUCOSE 91 94  BUN 12 11  CREATININE 1.09 1.02  CALCIUM 8.1* 8.1*   PT/INR No results for input(s): LABPROT, INR in the last 72 hours. CMP     Component Value Date/Time   NA 133 (L) 09/30/2020 0417   NA 140 08/12/2020 0926   K 4.4 09/30/2020 0417   CL 97 (L) 09/30/2020 0417   CO2 28 09/30/2020 0417   GLUCOSE 94 09/30/2020 0417   BUN 11 09/30/2020 0417   BUN 18 08/12/2020 0926   CREATININE 1.02 09/30/2020 0417   CALCIUM 8.1 (L) 09/30/2020 0417   PROT 5.7 (L) 09/30/2020 0417   PROT 6.5 12/13/2019 0936   ALBUMIN 2.2 (L) 09/30/2020 0417   ALBUMIN 4.1 12/13/2019 0936   AST 39 09/30/2020 0417   ALT 27 09/30/2020 0417   ALKPHOS 139 (H) 09/30/2020 0417   BILITOT 1.0 09/30/2020 0417   BILITOT  1.8 (H) 12/13/2019 0936   GFRNONAA >60 09/30/2020 0417   GFRAA 97 08/12/2020 0926   Lipase     Component Value Date/Time   LIPASE 44 09/23/2020 1800       Studies/Results: NM HEPATO BILIARY LEAK  Result Date: 09/29/2020 CLINICAL DATA:  Upper abdominal pain, recurrent status post cholecystectomy. EXAM: NUCLEAR MEDICINE HEPATOBILIARY IMAGING TECHNIQUE: Sequential images of the abdomen were obtained out to 60 minutes following intravenous administration of radiopharmaceutical. RADIOPHARMACEUTICALS:  5.2 mCi Tc-87m  Choletec IV COMPARISON:  CT abdomen and pelvis of September 23, 2020 FINDINGS: Prompt uptake of activity seen in the liver with excretion into the common bile duct and beyond into the small bowel. No sign of leak. Imaging was performed for total of 2 hours. Faint activity along the RIGHT lateral aspect of the patient is related to the IV. IMPRESSION: No sign of biliary leak or obstruction Electronically Signed   By: Donzetta Kohut M.D.   On: 09/29/2020 16:42    Anti-infectives: Anti-infectives (From admission, onward)   Start     Dose/Rate Route Frequency Ordered Stop   09/26/20 1000  fluconazole (DIFLUCAN) tablet 200 mg  Status:  Discontinued        200 mg Oral Daily 09/25/20 1355 09/26/20 1728   09/24/20  0800  piperacillin-tazobactam (ZOSYN) IVPB 3.375 g  Status:  Discontinued        3.375 g 12.5 mL/hr over 240 Minutes Intravenous Every 8 hours 09/24/20 0030 09/26/20 1151   09/24/20 0030  piperacillin-tazobactam (ZOSYN) IVPB 3.375 g        3.375 g 100 mL/hr over 30 Minutes Intravenous  Once 09/24/20 0006 09/24/20 0138   09/24/20 0015  fluconazole (DIFLUCAN) tablet 150 mg  Status:  Discontinued       Note to Pharmacy: Budding yeast urine with some urinary symptoms who will likely need to undergo cystoscopy.   150 mg Oral Daily 09/24/20 0010 09/25/20 1355       Assessment/Plan CAD- s/pCABGx 49/21/2021byDr. Z Atkins.  PAF-on Xarelto Hypertension Acute on  chronic diastolic CHF Diarrhea- improved  Enlarging L pleural effusion Left-sided kidney stone at the ureteropelvic junction - no significant hydronephrosis but could be contributing to symptoms Rash - per medicine, suspect 2/2 zosyn, improving  Infarcted gallbladder with cholelithiasis Choledocholithiasis S/p ERCP/sphincterotomy/balloon extraction biliary stones 09/17/2020 Dr. Claudette Head POD#12,S/p laparoscopic partial cholecystectomy/drain placement 09/18/2020 Dr. Ovidio Kin - HIDA negative for leak - JP drain output low volume and likely just old blood given negative HIDA. D/c JP drain today. Follow up appointment in our office on AVS. We will sign off, please call with concerns.  FEN: HH diet ID: was discharged on PO augmentin; PO fluconazole 11/17>>, Zosyn 11/17>>Completed 7 days of post abx from gb standpoint. No further needed. [id number] Follow-up:Dr. Ezzard Standing 12/15 at 9:45am   LOS: 6 days    Franne Forts, [facility] 09/30/2020, 9:17 AM Please see Amion for pager number during day hours 7:00am-4:30pm

## 2020-09-30 NOTE — TOC Progression Note (Signed)
Transition of Care [facility]) - Progression Note    Patient Details  Name: [name] Rice MRN: [id number] Date of Birth: 1951/05/31  Transition of Care [facility]) CM/SW Contact  Geni Bers, RN Phone Number: 09/30/2020, 1:57 PM  Clinical Narrative:    [facility] was faxed and called. Erie Noe [phone] rep, fax [phone].   Expected Discharge Plan: Skilled Nursing Facility    Expected Discharge Plan and Services Expected Discharge Plan: Skilled Nursing Facility   Discharge Planning Services: CM Consult   Living arrangements for the past 2 months: Single Family Home                                       Social Determinants of Health (SDOH) Interventions    Readmission Risk Interventions Readmission Risk Prevention Plan 09/25/2020  Transportation Screening Complete  PCP or Specialist Appt within 3-5 Days Complete  HRI or Home Care Consult Complete  Some recent data might be hidden

## 2020-09-30 NOTE — Care Management Important Message (Signed)
Important Message  Patient Details IM Letter given to the Patient. Name: [name] Rice MRN: [id number] Date of Birth: 04-09-1951   Medicare Important Message Given:  Yes     [name] 09/30/2020, 10:52 AM

## 2020-09-30 NOTE — TOC Progression Note (Signed)
Transition of Care [facility]) - Progression Note    Patient Details  Name: [name] Rice MRN: [id number] Date of Birth: 01/20/1951  Transition of Care [facility]) CM/SW Contact  Geni Bers, RN Phone Number: 09/30/2020, 10:47 AM  Clinical Narrative:    Sherron Monday with Erie Noe from [facility] concerning pt. Erie Noe will start authorization and will need PT notes for [facility] and a copy of pt's immunizations faxed to [facility].    Expected Discharge Plan: Skilled Nursing Facility    Expected Discharge Plan and Services Expected Discharge Plan: Skilled Nursing Facility   Discharge Planning Services: CM Consult   Living arrangements for the past 2 months: Single Family Home                                       Social Determinants of Health (SDOH) Interventions    Readmission Risk Interventions Readmission Risk Prevention Plan 09/25/2020  Transportation Screening Complete  PCP or Specialist Appt within 3-5 Days Complete  HRI or Home Care Consult Complete  Some recent data might be hidden

## 2020-09-30 NOTE — Progress Notes (Signed)
Physical Therapy Treatment Patient Details Name: [name] Rice MRN: [id number] DOB: May 10, 1951 Today's Date: 09/30/2020    History of Present Illness 69 yo male admitted with N/V, weakness, fall at home. 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 Recent d/c 09/22/2020    PT Comments    Patient received awake in bed. He has thigh high TED hose and abd binder donned. He is educated on wearing abd binder any time he gets up from supine; he demo's comprehension. Beginning supine BP: 118/72. Supervision to get EOB using bed rail, no additional time needed. Initial EOB BP 95/70, after ~3 min 111/61. He has c/o mild dizziness initially, which subsides after ~1 min. Pt is min G for safety to stand from elevated bed surface, due to height, and using B UE's for push off. Initial standing BP 80/61. Pt c/o increased dizziness. He is instructed to do standing marches in attempt to increase BP, resulting in c/o and s/s of increased dizziness. Pt is immediately sat down and assisted to bed mod A+2 for safety. Ending supine BP 143/94. HR and O2 sat WNL throughout.  Follow Up Recommendations  SNF     Equipment Recommendations  None recommended by PT    Recommendations for Other Services       Precautions / Restrictions Precautions Precautions: Fall Precaution Comments: Orthostatic hypotension Required Braces or Orthoses: Other Brace Other Brace: abd binder and thigh high ted hose Restrictions Weight Bearing Restrictions: No    Mobility  Bed Mobility Overal bed mobility: Needs Assistance Bed Mobility: Supine to Sit;Sit to Supine     Supine to sit: Supervision Sit to supine: +2 for safety/equipment;Mod assist   General bed mobility comments: Supervision to get EOB w use of bed rail. Mod A +2 to get supine for safety due to pt experiencing hypotensive symptoms.  Transfers Overall transfer level: Needs assistance Equipment used: Rolling walker (2 wheeled) Transfers: Sit  to/from Stand Sit to Stand: Min guard         General transfer comment: Min G to stand from elevated bed surface due to pt height, however, he is physically able to stand up from lower surface.  Ambulation/Gait             General Gait Details: Gait deferred at this time due to hypotensive symptoms   Stairs             Wheelchair Mobility    Modified Rankin (Stroke Patients Only)       Balance                                            Cognition Arousal/Alertness: Awake/alert Behavior During Therapy: WFL for tasks assessed/performed Overall Cognitive Status: Within Functional Limits for tasks assessed                                        Exercises General Exercises - Lower Extremity Hip Flexion/Marching: AROM;Both;Other reps (comment) (Marchiing standing at EOB for ~1.5 min in attempt to raise BP)    General Comments        Pertinent Vitals/Pain Pain Assessment: No/denies pain    Home Living                      Prior  Function            PT Goals (current goals can now be found in the care plan section) Acute Rehab PT Goals Patient Stated Goal: To increase stamina in order to care for himself at home. PT Goal Formulation: With patient Time For Goal Achievement: 10/08/20 Potential to Achieve Goals: Good Progress towards PT goals: Not progressing toward goals - comment (Pt unable to attempt goals due to symptomatic orthostatic hypotension)    Frequency    Min 3X/week      PT Plan Current plan remains appropriate    Co-evaluation              AM-PAC PT "6 Clicks" Mobility   Outcome Measure  Help needed turning from your back to your side while in a flat bed without using bedrails?: None Help needed moving from lying on your back to sitting on the side of a flat bed without using bedrails?: None Help needed moving to and from a bed to a chair (including a wheelchair)?: None Help  needed standing up from a chair using your arms (e.g., wheelchair or bedside chair)?: None Help needed to walk in hospital room?: A Lot Help needed climbing 3-5 steps with a railing? : A Lot 6 Click Score: 20    End of Session Equipment Utilized During Treatment: Gait belt Activity Tolerance: Treatment limited secondary to medical complications (Comment) (orthostatic hypotension prevents pt from standing for increased period of time and/or performing gait) Patient left: with call bell/phone within reach;in bed;with bed alarm set Nurse Communication: Mobility status PT Visit Diagnosis: Muscle weakness (generalized) (M62.81);History of falling (Z91.81)     Time: 3151-7616 PT Time Calculation (min) (ACUTE ONLY): 30 min  Charges:  $Therapeutic Activity: 23-37 mins                     C. Alinda Dooms, SPTA [facility] [phone]

## 2020-10-01 DIAGNOSIS — I4819 Other persistent atrial fibrillation: Secondary | ICD-10-CM | POA: Diagnosis not present

## 2020-10-01 DIAGNOSIS — R531 Weakness: Secondary | ICD-10-CM | POA: Diagnosis not present

## 2020-10-01 DIAGNOSIS — R112 Nausea with vomiting, unspecified: Secondary | ICD-10-CM | POA: Diagnosis not present

## 2020-10-01 LAB — MAGNESIUM: Magnesium: 2.2 mg/dL (ref 1.7–2.4)

## 2020-10-01 LAB — CBC WITH DIFFERENTIAL/PLATELET
Abs Immature Granulocytes: 0.07 10*3/uL (ref 0.00–0.07)
Basophils Absolute: 0.1 10*3/uL (ref 0.0–0.1)
Basophils Relative: 1 %
Eosinophils Absolute: 1.1 10*3/uL — ABNORMAL HIGH (ref 0.0–0.5)
Eosinophils Relative: 12 %
HCT: 34 % — ABNORMAL LOW (ref 39.0–52.0)
Hemoglobin: 10.6 g/dL — ABNORMAL LOW (ref 13.0–17.0)
Immature Granulocytes: 1 %
Lymphocytes Relative: 18 %
Lymphs Abs: 1.6 10*3/uL (ref 0.7–4.0)
MCH: 28.3 pg (ref 26.0–34.0)
MCHC: 31.2 g/dL (ref 30.0–36.0)
MCV: 90.9 fL (ref 80.0–100.0)
Monocytes Absolute: 1 10*3/uL (ref 0.1–1.0)
Monocytes Relative: 11 %
Neutro Abs: 5.1 10*3/uL (ref 1.7–7.7)
Neutrophils Relative %: 57 %
Platelets: 303 10*3/uL (ref 150–400)
RBC: 3.74 MIL/uL — ABNORMAL LOW (ref 4.22–5.81)
RDW: 17.2 % — ABNORMAL HIGH (ref 11.5–15.5)
WBC: 9 10*3/uL (ref 4.0–10.5)
nRBC: 0 % (ref 0.0–0.2)

## 2020-10-01 LAB — COMPREHENSIVE METABOLIC PANEL
ALT: 27 U/L (ref 0–44)
AST: 37 U/L (ref 15–41)
Albumin: 2.1 g/dL — ABNORMAL LOW (ref 3.5–5.0)
Alkaline Phosphatase: 143 U/L — ABNORMAL HIGH (ref 38–126)
Anion gap: 9 (ref 5–15)
BUN: 12 mg/dL (ref 8–23)
CO2: 24 mmol/L (ref 22–32)
Calcium: 8 mg/dL — ABNORMAL LOW (ref 8.9–10.3)
Chloride: 99 mmol/L (ref 98–111)
Creatinine, Ser: 0.88 mg/dL (ref 0.61–1.24)
GFR, Estimated: >60 mL/min (ref >60)
Glucose, Bld: 96 mg/dL (ref 70–99)
Potassium: 4 mmol/L (ref 3.5–5.1)
Sodium: 132 mmol/L — ABNORMAL LOW (ref 135–145)
Total Bilirubin: 0.8 mg/dL (ref 0.3–1.2)
Total Protein: 5.6 g/dL — ABNORMAL LOW (ref 6.5–8.1)

## 2020-10-01 LAB — PHOSPHORUS: Phosphorus: 2.8 mg/dL (ref 2.5–4.6)

## 2020-10-01 MED ORDER — MIDODRINE HCL 5 MG PO TABS
5.0000 mg | ORAL_TABLET | Freq: Three times a day (TID) | ORAL | Status: DC
Start: 2020-10-01 — End: 2020-10-22

## 2020-10-01 MED ORDER — TAMSULOSIN HCL 0.4 MG PO CAPS
0.4000 mg | ORAL_CAPSULE | Freq: Every day | ORAL | Status: DC
Start: 2020-10-02 — End: 2020-10-22

## 2020-10-01 MED ORDER — MELATONIN 3 MG PO TABS: 6.0000 mg | ORAL_TABLET | Freq: Every evening | ORAL | 0 refills | Status: DC | PRN

## 2020-10-01 MED ORDER — PROSOURCE PLUS PO LIQD
30.0000 mL | Freq: Every day | ORAL | Status: DC
  Administered 2020-10-01: 30 mL via ORAL
  Filled 2020-10-01: qty 30

## 2020-10-01 MED ORDER — FAMOTIDINE 20 MG PO TABS
20.0000 mg | ORAL_TABLET | Freq: Two times a day (BID) | ORAL | Status: DC
Start: 2020-10-01 — End: 2021-01-26

## 2020-10-01 NOTE — TOC Progression Note (Addendum)
Transition of Care [facility]) - Progression Note    Patient Details  Name: [name] Rice MRN: [id number] Date of Birth: 03/29/1951  Transition [facility] Care [facility]) CM/SW Contact  Golda Acre, RN Phone Number: 10/01/2020, 2:17 PM  Clinical Narrative:    tctDarnelle Maffucci TCT=[facility] at [facility]/approval from [facility] has been given. Will need to let her know dc date asap due to holiday staffing. TCT-[facility]essa patient has been dcd Brother is going to transport.  Can come tonight tct-patient calling brother to see when he can come and get patient. Brother is on the way coming from [place] est. Time of arrival is 6pm, est. Time of arrival to the rehab is 730pm -8pm note with dc summary faxed to [place] at [phone]. Expected Discharge Plan: Skilled Nursing Facility    Expected Discharge Plan and Services Expected Discharge Plan: Skilled Nursing Facility   Discharge Planning Services: CM Consult   Living arrangements for the past 2 months: Single Family Home                                       Social Determinants of Health (SDOH) Interventions    Readmission Risk Interventions Readmission Risk Prevention Plan 09/25/2020  Transportation Screening Complete  PCP or Specialist Appt within 3-5 Days Complete  HRI or Home Care Consult Complete  Some recent data might be hidden

## 2020-10-01 NOTE — Progress Notes (Signed)
Nutrition Follow-up  DOCUMENTATION CODES:   Not applicable  INTERVENTION:  - continue Ensure Enlive BID. - will trial 30 ml Prosource Plus once/day, each supplement provides 100 kcal and 15 grams protein.  - weight patient today.   NUTRITION DIAGNOSIS:   Inadequate oral intake related to poor appetite, other (see comment) as evidenced by per patient/family report. -improving  GOAL:   Patient will meet greater than or equal to 90% of their needs -minimally met on average  MONITOR:   PO intake, Supplement acceptance, Diet advancement, Labs, Weight trends  REASON FOR ASSESSMENT:   Consult Assessment of nutrition requirement/status, Poor PO  ASSESSMENT:   69 y.o. male with medical history of chronic afib, CAD, s/p CABG almost 2 months ago (07/2020), LBBB, HTN, and CHF. He was admitted 11/7-11/15 due to acute cholecystitis requiring ERCP and partial lap chole. He presented to the ED on 11/16 due to weakness with associated fall, decreased oral intake, abdominal pain, nausea, emesis x2.  Most recent documented intakes are 100% of breakfast and 75% of lunch on 11/20 (total of 1118 kcal, 18 grams protein); 90% of lunch on 11/21 (663 kcal, 34 grams protein); 100% of breakfast and lunch and 20% of dinner on 11/22 (total of 1302 kcal, 56 grams protein); 80% of dinner yesterday (409 kcal, 8 grams protein).   He has been accepting Ensure ~100% of the time offered.  BMs have now been solid/formed and he is no longer having diarrhea.   He has not been weighed since admission on 11/17.    Labs reviewed; Na: 132 mmol/l, Ca: 8 mg/dl, Alk Phos elevated but trending down. Medications reviewed; 200 mag-ox BID, 1 tablet multivitamin with minerals/day, 20 mEq Klor-Con/day.   Diet Order:   Diet Order            Diet Heart Room service appropriate? Yes; Fluid consistency: Thin  Diet effective now                 EDUCATION NEEDS:   No education needs have been identified at this  time  Skin:  Skin Assessment: Reviewed RN Assessment  Last BM:  11/24  Height:   Ht Readings from Last 1 Encounters:  09/24/20 _0  (1.88 m)    Weight:   Wt Readings from Last 1 Encounters:  09/24/20 109.7 kg     Estimated Nutritional Needs:  Kcal:  2300-2500 kcal Protein:  115-125 grams Fluid:  >/= 2.4 L/day     [name] Matin, MS, RD, LDN, CNSC Inpatient Clinical Dietitian RD pager # available in AMION  After hours/weekend pager # available in [facility]

## 2020-10-01 NOTE — Discharge Summary (Signed)
Physician Discharge Summary  [name] Rice ZOX:[id number] DOB: [birth year] DOA: 09/23/2020  PCP: [name], PA-C  Admit date: 09/23/2020 Discharge date: 10/01/2020  Admitted From: Home Disposition: Skilled nursing facility  Recommendations for Outpatient Follow-up:  1. Follow up with PCP in 1-2 weeks Please obtain BMP/CBC in one week Follow-up with surgery as a scheduled.  Home Health: Not applicable Equipment/Devices: Not applicable  Discharge Condition: Stable CODE STATUS: Full code Diet recommendation: Regular diet, low-carb diet.  Discharge summary: 69 year old gentleman with history of chronic A. fib on Xarelto, coronary artery disease status post CABG about 2 months ago, left bundle branch block, chronic systolic heart failure with known ejection fraction of 50%, recent admission from 11/7-11/15 for acute cholangitis and cholecystitis discharged home on JP drain presents back to the hospital with emesis, fall and weakness with decreased oral intake.  In the emergency room afebrile.  Blood pressure stable.  He was admitted to the hospital with dehydration, orthostasis.  Mild right-sided hydronephrosis with ureteric calculus without complete obstruction, UTI present on admission: Treated symptomatically with Flomax.  Treated with antibiotics.  Urine culture with no growth.  All antibiotics discontinued.  He has adequate clinical improvement now.  Generalized weakness/debility/orthostatic hypotension with recent history of CABG and recent history of abdominal surgery: Clinically improving with IV fluid hydration and antibiotic treatment.  He was persistently orthostatic and did good clinical response with oral midodrine. Unable to tolerate antihypertensive medications, will discontinue all except metoprolol for rate control. Midodrine 5 mg 3 times a day, he may ultimately come out of it.  Chronic A. fib: Rate controlled.  He is on metoprolol.  He is on Xarelto for  anticoagulation.  CAD: Status post CABG and deconditioning.  Currently stable.  Recent cholecystectomy: Adequately improved.  New HIDA scan with no evidence of leak.  Seen by surgery.  JP drain removed.  Antibiotics course completed.  Hyperlipidemia: Resume statin on discharge.  Patient is adequately stabilized.  He will be able to go to a skilled nursing facility to continue rehab before returning home independent. He will need all-time orthostatic precautions.   Discharge Diagnoses:  Principal Problem:   Nausea and vomiting Active Problems:   Atrial fibrillation (HCC)   Pure hypercholesterolemia   Malnutrition of moderate degree (HCC)   Acute blood loss anemia   CAD, multiple vessel   Benign hypertension   Right sided hydronephrosis with renal and ureteral calculus obstruction   Budding yeast in U/A detected   Generalized weakness   Dyspnea   Pleural effusion   S/P thoracentesis    Discharge Instructions  Discharge Instructions    Diet - low sodium heart healthy   Complete by: As directed    Increase activity slowly   Complete by: As directed    No wound care   Complete by: As directed      Allergies as of 10/01/2020   No Known Allergies     Medication List    STOP taking these medications   amLODipine 10 MG tablet Commonly known as: NORVASC   amoxicillin-clavulanate 875-125 MG tablet Commonly known as: Augmentin   HYDROcodone-acetaminophen 5-325 MG tablet Commonly known as: NORCO/VICODIN   olmesartan 40 MG tablet Commonly known as: BENICAR     TAKE these medications   acetaminophen 500 MG tablet Commonly known as: TYLENOL Take 1,000 mg by mouth every 6 (six) hours as needed for moderate pain.   aspirin EC 81 MG tablet Take 81 mg by mouth daily. Swallow whole.  Ensure Take 1 Can by mouth See admin instructions. Drink 1 can of VANILLA ENSURE by mouth once a day   famotidine 20 MG tablet Commonly known as: PEPCID Take 1 tablet (20 mg total)  by mouth 2 (two) times daily.   loperamide 2 MG tablet Commonly known as: IMODIUM A-D Take 2 mg by mouth 4 (four) times daily as needed for diarrhea or loose stools.   melatonin 3 MG Tabs tablet Take 2 tablets (6 mg total) by mouth at bedtime as needed (sleep).   metoprolol tartrate 25 MG tablet Commonly known as: LOPRESSOR Take 0.5 tablets (12.5 mg total) by mouth daily. What changed: when to take this   midodrine 5 MG tablet Commonly known as: PROAMATINE Take 1 tablet (5 mg total) by mouth 3 (three) times daily with meals.   rosuvastatin 40 MG tablet Commonly known as: CRESTOR Take 1 tablet (40 mg total) by mouth daily. What changed: when to take this   tamsulosin 0.4 MG Caps capsule Commonly known as: FLOMAX Take 1 capsule (0.4 mg total) by mouth daily. Start taking on: October 02, 2020   Xarelto 20 MG Tabs tablet Generic drug: rivaroxaban TAKE 1 TABLET BY MOUTH IN  THE EVENING AFTER DINNER What changed: See the new instructions.       Follow-up Information    Ovidio Kin, MD. Go on 10/22/2020.   Specialty: General Surgery Why: Your appointment is 12/15 at 9:45am Please arrive 30 minutes prior to your appointment to check in and fill out paperwork. Bring photo ID and insurance information. Contact information: [address] [place] [address] [phone]              No Known Allergies  Consultations:  Surgery   Procedures/Studies: CT ABDOMEN PELVIS W CONTRAST  Result Date: 09/23/2020 CLINICAL DATA:  Status post cholecystectomy with weakness. Anorexia. Nausea. Recent fall. EXAM: CT ABDOMEN AND PELVIS WITH CONTRAST TECHNIQUE: Multidetector CT imaging of the abdomen and pelvis was performed using the standard protocol following bolus administration of intravenous contrast. CONTRAST:  OMNIPAQUE IOHEXOL 300 MG/ML  SOLN COMPARISON:  09/15/2020 MRCP. 09/17/2020 ERCP. Abdominopelvic CT 09/14/2020. FINDINGS: Lower chest: Mild right base  atelectasis with calcified right sided granulomas. Left lower lobe collapse with increase in moderate left and small right pleural effusions. Mild cardiomegaly with prior median sternotomy and native coronary artery atherosclerosis. Hepatobiliary: Minimal pneumobilia. Resolved intrahepatic biliary duct dilatation. Probable focal fat adjacent the falciform ligament. Cholecystectomy. Ill-defined fluid and edema within the operative bed. Example at 4.6 x 2.8 cm on 31/2. More caudally, contiguous somewhat ill-defined fluid anterior to the pylorus measures 3.0 x 2.5 cm on 37/2. Surgical drain terminates in the operative bed. Pancreas: Normal, without mass or ductal dilatation. Spleen: Normal in size, without focal abnormality. Adrenals/Urinary Tract: Normal adrenal glands. A stone at the left ureteropelvic junction measures 6 mm on 41/2, in the left renal pelvis on the prior. Development of right-sided mild hydroureteronephrosis with decreased excretion of contrast from the right kidney. A mid right ureteric 3 mm stone is most apparent on coronal image 72. The right-side of the bladder enters an inguinal hernia, similar. Stomach/Bowel: Normal stomach, without wall thickening. Normal colon, appendix, and terminal ileum. Normal abdominal small bowel. Vascular/Lymphatic: Aortic atherosclerosis. No abdominopelvic adenopathy. Reproductive: Normal prostate. Other: Right inguinal hernia containing fat and a portion of the bladder, similar. No significant free fluid. No free intraperitoneal air. Minimal air within the anterior subcutaneous abdominal wall is likely postoperative. Musculoskeletal: Subtle  left femoral head avascular necrosis. IMPRESSION: 1. Status post cholecystectomy with ill-defined fluid and edema in the operative bed. Considerations include phlegmon/developing abscess, biloma, and postoperative hematoma. 2. Development of right-sided urinary tract obstruction secondary to a punctate mid right ureteric stone. 3.  Left-sided stone at the ureteropelvic junction, without significant hydronephrosis. 4. Increase in left larger than right pleural effusions with progressive left base atelectasis. 5. Fat and right-side of the bladder within a inguinal hernia, as before. 6.  Aortic Atherosclerosis (ICD10-I70.0). Electronically Signed   By: Jeronimo Greaves M.D.   On: 09/23/2020 19:22   CT Abdomen Pelvis W Contrast  Result Date: 09/14/2020 CLINICAL DATA:  Diarrhea after surgery, fatigue, weakness, history atrial fibrillation, coronary artery disease, hypertension, post CABG and Maze procedure on 07/29/2020 EXAM: CT ABDOMEN AND PELVIS WITH CONTRAST TECHNIQUE: Multidetector CT imaging of the abdomen and pelvis was performed using the standard protocol following bolus administration of intravenous contrast. Sagittal and coronal MPR images reconstructed from axial data set. CONTRAST:  OMNIPAQUE IOHEXOL 300 MG/ML SOLN IV. No oral contrast. COMPARISON:  07/16/2017 FINDINGS: Lower chest: Small to moderate LEFT pleural effusion with partial atelectasis of LEFT lower lobe. Minimal RIGHT pleural effusion and basilar atelectasis dependently. Calcified granuloma RIGHT lower lobe. Hepatobiliary: Distended gallbladder with wall thickening and pericholecystic infiltrative changes concerning for acute cholecystitis. No definite calcified gallstones. Dilated CBD up to 19 mm diameter to ampulla. No definite calcification at duodenal junction though questionable wall thickening at the ampulla is present. Intrahepatic biliary dilatation present. No focal hepatic mass lesion. Pancreas: No focal abnormalities Spleen: Normal appearance Adrenals/Urinary Tract: 5 mm calculus at central LEFT kidney. Cortical scarring LEFT kidney. Adrenal glands, kidneys, and ureters otherwise normal appearance. Bladder contains small amount of urine and extends into a RIGHT inguinal hernia. Stomach/Bowel: Minimal colonic diverticulosis without evidence of diverticulitis.  Normal appendix. Stomach decompressed containing radiodense material question medication. Remaining bowel loops unremarkable. Vascular/Lymphatic: Atherosclerotic calcifications aorta and iliac arteries without aneurysm. No adenopathy Reproductive: Unremarkable prostate gland and seminal vesicles. Other: RIGHT inguinal hernia containing fat and portion of the urinary bladder. No bowel herniation. No additional hernia is identified. No free air or free fluid. Musculoskeletal: Osseous demineralization. IMPRESSION: Distended gallbladder with wall thickening and pericholecystic infiltrative changes concerning for acute cholecystitis. Dilated CBD up to 19 mm diameter with intrahepatic biliary dilatation, recommend correlation with LFTs; cause of biliary dilatation is uncertain, without calcification evident, could be due to noncalcified stone, mass, or stricture. This could be assessed by MRCP imaging for further evaluation. RIGHT inguinal hernia containing fat and a portion of the urinary bladder. 5 minutes mm nonobstructing LEFT renal calculus. Small to moderate LEFT and minimal RIGHT pleural effusions with bibasilar atelectasis greater on LEFT. Minimal colonic diverticulosis without evidence of diverticulitis. Aortic Atherosclerosis (ICD10-I70.0). Electronically Signed   By: Ulyses Southward M.D.   On: 09/14/2020 15:17   MR 3D Recon At Scanner  Result Date: 09/16/2020 CLINICAL DATA:  Elevated liver function studies and biliary dilatation on CT scan. EXAM: MRI ABDOMEN WITHOUT AND WITH CONTRAST (INCLUDING MRCP) TECHNIQUE: Multiplanar multisequence MR imaging of the abdomen was performed both before and after the administration of intravenous contrast. Heavily T2-weighted images of the biliary and pancreatic ducts were obtained, and three-dimensional MRCP images were rendered by post processing. CONTRAST:  47mL GADAVIST GADOBUTROL 1 MMOL/ML IV SOLN COMPARISON:  CT scan 09/14/2020 and ultrasound 09/04/2020 FINDINGS:  Examination limited due to breathing motion artifact. Lower chest: There is a left pleural effusion and left lower lobe  infiltrate or atelectasis. No pericardial effusion. Hepatobiliary: Moderate intrahepatic biliary dilatation and significant common bile duct dilatation measuring up to 19 mm. The gallbladder demonstrates distension and there is layering sludge in the gallbladder and possible tiny stones. Similar findings are demonstrated in the common bile duct with layering sludge. No large obstructing common bile duct stone is identified. No hepatic lesions. The portal and hepatic veins are patent. Marked wall thickening suggesting acute cholecystitis. Pancreas:  No mass, inflammation or ductal dilatation. Spleen:  Normal size. No focal lesions. Adrenals/Urinary Tract: The adrenal glands and kidneys are unremarkable. No renal lesions are hydronephrosis. Stomach/Bowel: The stomach, duodenum, visualized small bowel and visualized colon are grossly normal. Vascular/Lymphatic: Age advanced atherosclerotic calcifications involving the aorta but no aneurysm or dissection. The branch vessels are patent. No mesenteric or retroperitoneal mass or adenopathy. Other: No ascites or abdominal wall hernia. Small scattered upper abdominal lymph nodes. Musculoskeletal: No significant bony findings. IMPRESSION: 1. Moderate intrahepatic biliary dilatation and significant common bile duct dilatation measuring up to 19 mm. No large obstructing common bile duct stone is identified. There is layering sludge in the common bile duct. No ampullary mass or pancreatic head mass. 2. Distended gallbladder with marked gallbladder wall thickening suggesting acute cholecystitis. Layering sludge and possible tiny stones. 3. Left pleural effusion and left lower lobe infiltrate or atelectasis. Electronically Signed   By: Rudie MeyerP.  Gallerani M.D.   On: 09/16/2020 05:10   US Abdomen Limited  Result Date: 09/04/2020 CLINICAL DATA:  69 year old male  with epigastric pain. Elevated LFTs. EXAM: ULTRASOUND ABDOMEN LIMITED RIGHT UPPER QUADRANT COMPARISON:  Right upper quadrant ultrasound dated 07/16/2017. FINDINGS: Gallbladder: There is layering sludge or small stones within the gallbladder. No gallbladder wall thickening or pericholecystic fluid. Negative sonographic Murphy's sign. Common bile duct: Diameter: 6 mm Liver: There is diffuse increased liver echogenicity most commonly seen in the setting of fatty infiltration. Superimposed inflammation or fibrosis is not excluded. Clinical correlation is recommended. There is a 2.0 x 1.6 x 1.7 cm echogenic lesion in the anterior liver which is not characterized on this ultrasound. No internal vascularity noted within this lesion on color Doppler. This may represent an area of increased fat deposit. Repeat ultrasound in 3 months or further characterization with MRI without and with contrast on a nonemergent/outpatient basis is advised. Portal vein is patent on color Doppler imaging with normal direction of blood flow towards the liver. Other: None. IMPRESSION: 1. Gallbladder sludge. No sonographic findings of acute cholecystitis. 2. Fatty liver. 3. Indeterminate echogenic lesion in the anterior liver as above. Follow-up recommended. Electronically Signed   By: Elgie CollardArash  Radparvar M.D.   On: 09/04/2020 20:00   DG CHEST PORT 1 VIEW  Result Date: 09/28/2020 CLINICAL DATA:  Shortness of breath EXAM: PORTABLE CHEST 1 VIEW COMPARISON:  September 26, 2020 FINDINGS: Calcified granuloma in the right base, unchanged. Small left effusion with underlying atelectasis. The cardiomediastinal silhouette is stable. No pneumothorax. No other changes. IMPRESSION: Small left effusion with underlying opacity, likely atelectasis. This finding is unchanged. No other changes. Electronically Signed   By: Gerome Samavid  Williams III M.D   On: 09/28/2020 09:25   DG CHEST PORT 1 VIEW  Result Date: 09/26/2020 CLINICAL DATA:  Shortness of breath,  weakness EXAM: PORTABLE CHEST 1 VIEW COMPARISON:  09/26/2020 FINDINGS: Prior median sternotomy and CABG. Heart is borderline in size. Left basilar atelectasis or infiltrate and small left effusion with slight improvement since prior study. Right lung clear. No acute bony abnormality. IMPRESSION: Continued small left  pleural effusion with left base atelectasis or infiltrate with some improvement in aeration since prior study. Electronically Signed   By: Charlett Nose M.D.   On: 09/26/2020 19:05   DG CHEST PORT 1 VIEW  Result Date: 09/26/2020 CLINICAL DATA:  Shortness of breath. EXAM: PORTABLE CHEST 1 VIEW COMPARISON:  09/25/2020. FINDINGS: Prior CABG. Left atrial appendage clip in stable position. Stable cardiomegaly. No pulmonary venous congestion. Calcified pulmonary nodules again noted consistent prior granulomas disease. Bibasilar interstitial prominence noted on today's exam. Persistent left-sided pleural effusion. Findings suggest CHF. Pneumonitis cannot be excluded. No pneumothorax. IMPRESSION: Prior CABG. Left atrial appendage clip in stable position. Stable cardiomegaly. Bibasilar interstitial prominence noted on today's exam. Persistent left-sided pleural effusion. Findings suggest CHF. Electronically Signed   By: Maisie Fus  Register   On: 09/26/2020 06:52   DG Chest Port 1 View  Result Date: 09/25/2020 CLINICAL DATA:  Status post left thoracentesis. EXAM: PORTABLE CHEST 1 VIEW COMPARISON:  Earlier today. FINDINGS: Small to moderate-sized left pleural effusion with an interval significant decrease in amount. No pneumothorax. Mild left basilar atelectasis, improved. Clear right lung. Stable enlarged cardiac silhouette and post CABG changes. Thoracic spine degenerative changes. IMPRESSION: 1. No pneumothorax following left thoracentesis. 2. Small to moderate-sized left pleural effusion with an interval significant decrease in amount. 3. Mild left basilar atelectasis, improved. Electronically Signed    By: Beckie Salts M.D.   On: 09/25/2020 11:50   DG CHEST PORT 1 VIEW  Result Date: 09/25/2020 CLINICAL DATA:  Left pleural effusion. Short of breath over the last several days. Increased weakness. EXAM: PORTABLE CHEST 1 VIEW COMPARISON:  09/23/2020 and older exams. FINDINGS: Moderate to large left pleural effusion appears mildly increased from the recent prior study. No visualized right pleural effusion.  No pneumothorax. Presumed atelectasis associated with the left pleural effusion. Lungs otherwise clear. Stable changes from prior cardiac surgery. IMPRESSION: 1. Mild interval increase in the size of the left pleural effusion compared to the study dated 09/23/2020. Pleural effusion is moderate to large. 2. No convincing pneumonia and no evidence of pulmonary edema. No other change. Electronically Signed   By: Amie Portland M.D.   On: 09/25/2020 09:54   DG Chest Port 1 View  Result Date: 09/23/2020 CLINICAL DATA:  Pleural effusions, post gallbladder surgery 09/22/2020. Increasing weakness and mechanical fall EXAM: PORTABLE CHEST 1 VIEW COMPARISON:  Radiograph 09/14/2020 FINDINGS: Enlarging left pleural effusion with adjacent opacity likely reflecting passive atelectasis though underlying infection or edema is not fully excluded. There is some gradient opacity in the right lung as well which could reflect atelectasis, edema or early airspace disease. Trace peripheral right pleural thickening may reflect trace effusion as well. No pneumothorax. Postsurgical changes related to prior CABG including intact and aligned sternotomy wires and multiple surgical clips projecting over the mediastinum. Atrial occluder device is noted. The aorta is calcified. The remaining cardiomediastinal contours are unremarkable. Telemetry leads overlie the chest. No acute osseous or soft tissue abnormality. IMPRESSION: 1. Enlarging left pleural effusion with adjacent opacity likely reflecting passive atelectasis though underlying  infection or edema is not fully excluded. 2. Vascular congestion and gradient opacity in the right lung which is favored to be a combination of atelectasis and edema. Trace right effusion as well. 3.  Aortic Atherosclerosis (ICD10-I70.0). Electronically Signed   By: Kreg Shropshire M.D.   On: 09/23/2020 20:13   DG Chest Port 1 View  Result Date: 09/14/2020 CLINICAL DATA:  Acute cholecystitis, diarrhea for 2 months, abdominal distension EXAM:  PORTABLE CHEST 1 VIEW COMPARISON:  09/01/2020 FINDINGS: Single frontal view of the chest demonstrates a stable cardiac silhouette. Chronic left basilar consolidation and effusion, stable. Right chest is clear. No pneumothorax. Chronic postsurgical changes from median sternotomy. IMPRESSION: 1. Persistent left basilar consolidation and effusion. Electronically Signed   By: Sharlet Salina M.D.   On: 09/14/2020 16:41   DG ERCP BILIARY & PANCREATIC DUCTS  Result Date: 09/17/2020 CLINICAL DATA:  69 year old male with a history elevated LFTs EXAM: ERCP TECHNIQUE: Multiple spot images obtained with the fluoroscopic device and submitted for interpretation post-procedure. FLUOROSCOPY TIME:  Fluoroscopy Time:  3 minutes 54 seconds COMPARISON:  MR 09/15/2020 FINDINGS: Limited intraoperative fluoroscopic spot images during ERCP. Initial image demonstrates the endoscope projecting over the upper abdomen. Subsequently there is cannulation of the ampulla with a safety wire and retrograde infusion of contrast. Vague filling defects in the distal common bile duct. Deployment of a retrieval balloon. IMPRESSION: Limited images during ERCP demonstrates partial opacification of the extrahepatic biliary ducts with deployment of a retrieval balloon. Please refer to the dictated operative report for full details of intraoperative findings and procedure. Electronically Signed   By: Gilmer Mor D.O.   On: 09/17/2020 15:11   MR ABDOMEN MRCP W WO CONTAST  Result Date: 09/16/2020 CLINICAL DATA:   Elevated liver function studies and biliary dilatation on CT scan. EXAM: MRI ABDOMEN WITHOUT AND WITH CONTRAST (INCLUDING MRCP) TECHNIQUE: Multiplanar multisequence MR imaging of the abdomen was performed both before and after the administration of intravenous contrast. Heavily T2-weighted images of the biliary and pancreatic ducts were obtained, and three-dimensional MRCP images were rendered by post processing. CONTRAST:  10mL GADAVIST GADOBUTROL 1 MMOL/ML IV SOLN COMPARISON:  CT scan 09/14/2020 and ultrasound 09/04/2020 FINDINGS: Examination limited due to breathing motion artifact. Lower chest: There is a left pleural effusion and left lower lobe infiltrate or atelectasis. No pericardial effusion. Hepatobiliary: Moderate intrahepatic biliary dilatation and significant common bile duct dilatation measuring up to 19 mm. The gallbladder demonstrates distension and there is layering sludge in the gallbladder and possible tiny stones. Similar findings are demonstrated in the common bile duct with layering sludge. No large obstructing common bile duct stone is identified. No hepatic lesions. The portal and hepatic veins are patent. Marked wall thickening suggesting acute cholecystitis. Pancreas:  No mass, inflammation or ductal dilatation. Spleen:  Normal size. No focal lesions. Adrenals/Urinary Tract: The adrenal glands and kidneys are unremarkable. No renal lesions are hydronephrosis. Stomach/Bowel: The stomach, duodenum, visualized small bowel and visualized colon are grossly normal. Vascular/Lymphatic: Age advanced atherosclerotic calcifications involving the aorta but no aneurysm or dissection. The branch vessels are patent. No mesenteric or retroperitoneal mass or adenopathy. Other: No ascites or abdominal wall hernia. Small scattered upper abdominal lymph nodes. Musculoskeletal: No significant bony findings. IMPRESSION: 1. Moderate intrahepatic biliary dilatation and significant common bile duct dilatation  measuring up to 19 mm. No large obstructing common bile duct stone is identified. There is layering sludge in the common bile duct. No ampullary mass or pancreatic head mass. 2. Distended gallbladder with marked gallbladder wall thickening suggesting acute cholecystitis. Layering sludge and possible tiny stones. 3. Left pleural effusion and left lower lobe infiltrate or atelectasis. Electronically Signed   By: Rudie Meyer M.D.   On: 09/16/2020 05:10   ECHOCARDIOGRAM LIMITED  Result Date: 09/27/2020    ECHOCARDIOGRAM LIMITED REPORT   Patient Name:   [name] Rice Date of Exam: 09/27/2020 Medical Rec #:  [id number]  Height:       74.0 in Accession #:    [id number]     Weight:       241.8 lb Date of Birth:  1951/09/27      BSA:          2.356 m Patient Age:    69 years       BP:           121/72 mmHg Patient Gender: M              HR:           96 bpm. Exam Location:  Inpatient Procedure: Limited Echo and Color Doppler Indications:     R06.9 DOE, CAD Native Vessel i25.0  History:         Patient has prior history of Echocardiogram examinations, most                  recent 07/30/2020. Prior CABG, Arrythmias:Atrial Fibrillation                  and LBBB; Risk Factors:Hypertension and Dyslipidemia.  Sonographer:     Irving Burton Senior RDCS Referring Phys:  [id number] Tessa Lerner Diagnosing Phys: Truett Mainland MD  Sonographer Comments: No IV access for definity at time of study. IMPRESSIONS  1. Left ventricular ejection fraction, by estimation, is 45 to 50%, although evaluation limited due to poor visualization of the endocardium. The left ventricle has mildly decreased function. The left ventricle demonstrates regional wall motion abnormalities (see scoring diagram/findings for description). Left ventricular diastolic parameters are indeterminate.  2. Right ventricular systolic function is normal. The right ventricular size is normal.  3. Left atrial size was mildly dilated.  4. Right atrial size was mildly dilated.   5. Normal right atrial pressure.  6. Compared to previous study on 07/24/2020, post-op changes are new. FINDINGS  Left Ventricle: Left ventricular ejection fraction, by estimation, is 45 to 50%. The left ventricle has mildly decreased function. The left ventricle demonstrates regional wall motion abnormalities. Abnormal (paradoxical) septal motion consistent with post-operative status. Left ventricular diastolic parameters are indeterminate. Right Ventricle: The right ventricular size is normal. No increase in right ventricular wall thickness. Right ventricular systolic function is normal. Left Atrium: Left atrial size was mildly dilated. Right Atrium: Right atrial size was mildly dilated. Pericardium: There is no evidence of pericardial effusion. Mitral Valve: Mild mitral annular calcification. Tricuspid Valve: The tricuspid valve is grossly normal. Tricuspid valve regurgitation is not demonstrated. Aortic Valve: The aortic valve is grossly normal. Aortic valve regurgitation is not visualized. Aorta: The aortic root and ascending aorta are structurally normal, with no evidence of dilitation. Venous: The inferior vena cava was not well visualized. IAS/Shunts: No atrial level shunt detected by color flow Doppler. Truett Mainland MD Electronically signed by Truett Mainland MD Signature Date/Time: 09/27/2020/12:00:22 PM    Final    NM HEPATO BILIARY LEAK  Result Date: 09/29/2020 CLINICAL DATA:  Upper abdominal pain, recurrent status post cholecystectomy. EXAM: NUCLEAR MEDICINE HEPATOBILIARY IMAGING TECHNIQUE: Sequential images of the abdomen were obtained out to 60 minutes following intravenous administration of radiopharmaceutical. RADIOPHARMACEUTICALS:  5.2 mCi Tc-47m  Choletec IV COMPARISON:  CT abdomen and pelvis of September 23, 2020 FINDINGS: Prompt uptake of activity seen in the liver with excretion into the common bile duct and beyond into the small bowel. No sign of leak. Imaging was performed for  total of 2 hours. Faint activity along the RIGHT lateral aspect of the patient is  related to the IV. IMPRESSION: No sign of biliary leak or obstruction Electronically Signed   By: Donzetta Kohut M.D.   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 Request is made for diagnostic and therapeutic left thoracentesis. EXAM: ULTRASOUND GUIDED DIAGNOSTIC AND THERAPEUTIC LEFT THORACENTESIS MEDICATIONS: 10 mL 1% lidocaine COMPLICATIONS: SIR Level A - No therapy, no consequence. PROCEDURE: An ultrasound guided thoracentesis was thoroughly discussed with the patient and questions answered. The benefits, risks, alternatives and complications were also discussed. The patient understands and wishes to proceed with the procedure. Written consent was obtained. Ultrasound was performed to localize and mark an adequate pocket of fluid in the left chest. The area was then prepped and draped in the normal sterile fashion. 1% Lidocaine was used for local anesthesia. Under ultrasound guidance a 6 Fr Safe-T-Centesis catheter was introduced. Thoracentesis was performed. The catheter was removed and a dressing applied. FINDINGS: A total of approximately 1.85 L of dark red fluid was removed. Procedure was stopped after 1.85 L secondary to patient's BP 78/40, repeat 85/49)- at that time, patient stated she was lightheaded with blurred vision. He was placed in RT with improvement of BP (92/51, repeat 106/56) and resolution of symptoms. Samples were sent to the laboratory as requested by the clinical team. IMPRESSION: Successful ultrasound guided left thoracentesis yielding 1.85 L of pleural fluid. Read by: Elwin Mocha, PA-C Electronically Signed   By: Corlis Leak M.D.   On: 09/25/2020 12:04    (Echo,  Carotid, EGD, Colonoscopy, ERCP)    Subjective: Patient seen and examined.  No overnight events.  Patient is still somehow orthostatic with drop in blood pressure of 10-20 systolic, however he had no symptoms.  We moved him from bed to chair with no dizziness.  He is wearing compression stockings.   Discharge Exam: Vitals:   10/01/20 0600 10/01/20 1316  BP:  115/65  Pulse:  90  Resp: (!) 24 18  Temp:  98.2 F (36.8 C)  SpO2:  99%   Vitals:   10/01/20 0546 10/01/20 0600 10/01/20 0854 10/01/20 1316  BP: (!) 105/59   115/65  Pulse: 86   90  Resp: 20 (!) 24  18  Temp: 98.4 F (36.9 C)   98.2 F (36.8 C)  TempSrc: Oral   Oral  SpO2: 96%   99%  Weight:   107.9 kg   Height:        General: Pt is alert, awake, not in acute distress Cardiovascular: RRR, S1/S2 +, no rubs, no gallops Respiratory: CTA bilaterally, no wheezing, no rhonchi Abdominal: Soft, NT, ND, bowel sounds + Distended and nontender.  No localized rigidity or tenderness. Extremities: no edema, no cyanosis    The results of significant diagnostics from this hospitalization (including imaging, microbiology, ancillary and laboratory) are listed below for reference.     Microbiology: Recent Results (from the past 240 hour(s))  Resp Panel by RT PCR (RSV, Flu A&B, Covid) - Nasopharyngeal Swab     Status: None   Collection Time: 09/23/20  8:01 PM   Specimen: Nasopharyngeal Swab  Result Value Ref Range Status   SARS Coronavirus 2 by RT PCR NEGATIVE NEGATIVE Final    Comment: (NOTE) SARS-CoV-2 target nucleic acids are NOT DETECTED.  The SARS-CoV-2 RNA is generally detectable  in upper respiratoy specimens during the acute phase of infection. The lowest concentration of SARS-CoV-2 viral copies this assay can detect is 131 copies/mL. A negative result does not preclude SARS-Cov-2 infection and should not be used as the sole basis for treatment or other patient management decisions. A negative result may occur with   improper specimen collection/handling, submission of specimen other than nasopharyngeal swab, presence of viral mutation(s) within the areas targeted by this assay, and inadequate number of viral copies (<131 copies/mL). A negative result must be combined with clinical observations, patient history, and epidemiological information. The expected result is Negative.  Fact Sheet for Patients:  https://www.moore.com/  Fact Sheet for Healthcare Providers:  https://www.young.biz/  This test is no t yet approved or cleared by the [place] FDA and  has been authorized for detection and/or diagnosis of SARS-CoV-2 by FDA under an Emergency Use Authorization (EUA). This EUA will remain  in effect (meaning this test can be used) for the duration of the COVID-19 declaration under Section 564(b)(1) of the Act, 21 U.S.C. section 360bbb-3(b)(1), unless the authorization is terminated or revoked sooner.     Influenza A by PCR NEGATIVE NEGATIVE Final   Influenza B by PCR NEGATIVE NEGATIVE Final    Comment: (NOTE) The Xpert Xpress SARS-CoV-2/FLU/RSV assay is intended as an aid in  the diagnosis of influenza from Nasopharyngeal swab specimens and  should not be used as a sole basis for treatment. Nasal washings and  aspirates are unacceptable for Xpert Xpress SARS-CoV-2/FLU/RSV  testing.  Fact Sheet for Patients: https://www.moore.com/  Fact Sheet for Healthcare Providers: https://www.young.biz/  This test is not yet approved or cleared by the [place] FDA and  has been authorized for detection and/or diagnosis of SARS-CoV-2 by  FDA under an Emergency Use Authorization (EUA). This EUA will remain  in effect (meaning this test can be used) for the duration of the  Covid-19 declaration under Section 564(b)(1) of the Act, 21  U.S.C. section 360bbb-3(b)(1), unless the authorization is  terminated or revoked.     Respiratory Syncytial Virus by PCR NEGATIVE NEGATIVE Final    Comment: (NOTE) Fact Sheet for Patients: https://www.moore.com/  Fact Sheet for Healthcare Providers: https://www.young.biz/  This test is not yet approved or cleared by the [place] FDA and  has been authorized for detection and/or diagnosis of SARS-CoV-2 by  FDA under an Emergency Use Authorization (EUA). This EUA will remain  in effect (meaning this test can be used) for the duration of the  COVID-19 declaration under Section 564(b)(1) of the Act, 21 U.S.C.  section 360bbb-3(b)(1), unless the authorization is terminated or  revoked. Performed at [facility], [address] [place], [place] [address]   Culture, Urine     Status: None   Collection Time: 09/24/20  2:00 PM   Specimen: Urine, Random  Result Value Ref Range Status   Specimen Description   Final    URINE, RANDOM Performed at [facility], [address], [place], [place] [address]    Special Requests   Final    NONE Performed at [facility], [address], [place], [place] [address]    Culture   Final    NO GROWTH Performed at [facility], [address], [place], [place] [address]    Report Status 09/25/2020 FINAL  Final  Body fluid culture     Status: None   Collection Time: 09/25/20 11:24 AM   Specimen: PATH Cytology Pleural  fluid  Result Value Ref Range Status   Specimen Description   Final    PLEURAL LT Performed at [facility], [address] [place], [place] [address]    Special Requests   Final    NONE Performed at [facility], [address] [place], [place] [address]    Gram Stain   Final    FEW WBC PRESENT, PREDOMINANTLY MONONUCLEAR NO ORGANISMS SEEN    Culture   Final    NO GROWTH 3 DAYS Performed at [facility], [address] [place], [place] [address]    Report  Status 09/28/2020 FINAL  Final     Labs: BNP (last 3 results) Recent Labs    07/24/20 0748 09/27/20 0509  BNP 206.5* 151.4*   Basic Metabolic Panel: Recent Labs  Lab 09/27/20 0509 09/28/20 0415 09/29/20 0434 09/30/20 0417 10/01/20 0548  NA 135 137 133* 133* 132*  K 3.6 3.5 4.0 4.4 4.0  CL 96* 95* 96* 97* 99  CO2 27 32 GLUCOSE 108* 93 91 94 96  BUN CREATININE 1.06 1.21 1.09 1.02 0.88  CALCIUM 8.0* 7.9* 8.1* 8.1* 8.0*  MG 1.9 2.0 2.0 2.3 2.2  PHOS 2.9 3.2 2.5 2.7 2.8   Liver Function Tests: Recent Labs  Lab 09/27/20 0509 09/28/20 0415 09/29/20 0434 09/30/20 0417 10/01/20 0548  AST 25 29 36 39 37  ALT ALKPHOS 165* 152* 148* 139* 143*  BILITOT 0.9 0.7 0.8 1.0 0.8  PROT 5.5* 5.4* 5.6* 5.7* 5.6*  ALBUMIN 2.3* 2.1* 2.2* 2.2* 2.1*   No results for input(s): LIPASE, AMYLASE in the last 168 hours. No results for input(s): AMMONIA in the last 168 hours. CBC: Recent Labs  Lab 09/27/20 0509 09/28/20 0415 09/29/20 0434 09/30/20 0417 10/01/20 0548  WBC 9.7 9.0 10.2 8.7 9.0  NEUTROABS 5.8 4.9 5.7 5.0 5.1  HGB 11.5* 10.5* 10.8* 10.5* 10.6*  HCT 38.2* 34.1* 35.6* 35.2* 34.0*  MCV 92.9 92.7 92.7 93.9 90.9  PLT 289 292 289 294 303   Cardiac Enzymes: No results for input(s): CKTOTAL, CKMB, CKMBINDEX, TROPONINI in the last 168 hours. BNP: Invalid input(s): POCBNP CBG: No results for input(s): GLUCAP in the last 168 hours. D-Dimer No results for input(s): DDIMER in the last 72 hours. Hgb A1c No results for input(s): HGBA1C in the last 72 hours. Lipid Profile No results for input(s): CHOL, HDL, LDLCALC, TRIG, CHOLHDL, LDLDIRECT in the last 72 hours. Thyroid function studies No results for input(s): TSH, T4TOTAL, T3FREE, THYROIDAB in the last 72 hours.  Invalid input(s): FREET3 Anemia work up No results for input(s): VITAMINB12, FOLATE, FERRITIN, TIBC, IRON, RETICCTPCT in the last 72 hours. Urinalysis    Component Value  Date/Time   COLORURINE YELLOW 09/23/2020 2135   APPEARANCEUR HAZY (A) 09/23/2020 2135   LABSPEC 1.041 (H) 09/23/2020 2135   PHURINE 5.0 09/23/2020 2135   GLUCOSEU NEGATIVE 09/23/2020 2135   HGBUR LARGE (A) 09/23/2020 2135   BILIRUBINUR NEGATIVE 09/23/2020 2135   KETONESUR 5 (A) 09/23/2020 2135   PROTEINUR 30 (A) 09/23/2020 2135   NITRITE NEGATIVE 09/23/2020 2135   LEUKOCYTESUR TRACE (A) 09/23/2020 2135   Sepsis Labs Invalid input(s): PROCALCITONIN,  WBC,  LACTICIDVEN Microbiology Recent Results (from the past 240 hour(s))  Resp Panel by RT PCR (RSV, Flu A&B, Covid) - Nasopharyngeal Swab     Status: None   Collection Time: 09/23/20  8:01  PM   Specimen: Nasopharyngeal Swab  Result Value Ref Range Status   SARS Coronavirus 2 by RT PCR NEGATIVE NEGATIVE Final    Comment: (NOTE) SARS-CoV-2 target nucleic acids are NOT DETECTED.  The SARS-CoV-2 RNA is generally detectable in upper respiratoy specimens during the acute phase of infection. The lowest concentration of SARS-CoV-2 viral copies this assay can detect is 131 copies/mL. A negative result does not preclude SARS-Cov-2 infection and should not be used as the sole basis for treatment or other patient management decisions. A negative result may occur with  improper specimen collection/handling, submission of specimen other than nasopharyngeal swab, presence of viral mutation(s) within the areas targeted by this assay, and inadequate number of viral copies (<131 copies/mL). A negative result must be combined with clinical observations, patient history, and epidemiological information. The expected result is Negative.  Fact Sheet for Patients:  https://www.moore.com/  Fact Sheet for Healthcare Providers:  https://www.young.biz/  This test is no t yet approved or cleared by the [place] FDA and  has been authorized for detection and/or diagnosis of SARS-CoV-2 by FDA under an Emergency  Use Authorization (EUA). This EUA will remain  in effect (meaning this test can be used) for the duration of the COVID-19 declaration under Section 564(b)(1) of the Act, 21 U.S.C. section 360bbb-3(b)(1), unless the authorization is terminated or revoked sooner.     Influenza A by PCR NEGATIVE NEGATIVE Final   Influenza B by PCR NEGATIVE NEGATIVE Final    Comment: (NOTE) The Xpert Xpress SARS-CoV-2/FLU/RSV assay is intended as an aid in  the diagnosis of influenza from Nasopharyngeal swab specimens and  should not be used as a sole basis for treatment. Nasal washings and  aspirates are unacceptable for Xpert Xpress SARS-CoV-2/FLU/RSV  testing.  Fact Sheet for Patients: https://www.moore.com/  Fact Sheet for Healthcare Providers: https://www.young.biz/  This test is not yet approved or cleared by the [place] FDA and  has been authorized for detection and/or diagnosis of SARS-CoV-2 by  FDA under an Emergency Use Authorization (EUA). This EUA will remain  in effect (meaning this test can be used) for the duration of the  Covid-19 declaration under Section 564(b)(1) of the Act, 21  U.S.C. section 360bbb-3(b)(1), unless the authorization is  terminated or revoked.    Respiratory Syncytial Virus by PCR NEGATIVE NEGATIVE Final    Comment: (NOTE) Fact Sheet for Patients: https://www.moore.com/  Fact Sheet for Healthcare Providers: https://www.young.biz/  This test is not yet approved or cleared by the [place] FDA and  has been authorized for detection and/or diagnosis of SARS-CoV-2 by  FDA under an Emergency Use Authorization (EUA). This EUA will remain  in effect (meaning this test can be used) for the duration of the  COVID-19 declaration under Section 564(b)(1) of the Act, 21 U.S.C.  section 360bbb-3(b)(1), unless the authorization is terminated or  revoked. Performed at [facility], [address], [place], [place] [address]   Culture, Urine     Status: None   Collection Time: 09/24/20  2:00 PM   Specimen: Urine, Random  Result Value Ref Range Status   Specimen Description   Final    URINE, RANDOM Performed at [facility], [address] [place], [place] [address]    Special Requests   Final    NONE Performed at [facility], [address], [place], [place] [address]    Culture   Final    NO GROWTH Performed at [facility], [address] [place], [place] [address]    Report Status 09/25/2020 FINAL  Final  Body fluid culture     Status: None   Collection Time: 09/25/20 11:24 AM   Specimen: PATH Cytology Pleural fluid  Result Value Ref Range Status   Specimen Description   Final    PLEURAL LT Performed at [facility], [address] [place], [place] [address]    Special Requests   Final    NONE Performed at [facility], [address], [place], [place] [address]    Gram Stain   Final    FEW WBC PRESENT, PREDOMINANTLY MONONUCLEAR NO ORGANISMS SEEN    Culture   Final    NO GROWTH 3 DAYS Performed at [facility], [address] [place], [place] [address]    Report Status 09/28/2020 FINAL  Final     Time coordinating discharge:  35 minutes  SIGNED:   Dorcas Carrow, MD  [facility] 10/01/2020, 2:59 PM

## 2020-10-13 ENCOUNTER — Telehealth: Payer: Self-pay

## 2020-10-13 NOTE — Telephone Encounter (Signed)
Patient called with questions regarding medications after being discharged from the hospital. He said he was taking 10mg  of Rosuvastatin and his prescription says 40mg . He is also taking amlodipine 10mg  which is not in his chart. He also stated that he is not taking the midodrine 5mg  tablet. He wants to know how he is suppose to be taking these medications. AD/S

## 2020-10-16 ENCOUNTER — Ambulatory Visit: Payer: Medicare Other | Admitting: Cardiology

## 2020-10-17 ENCOUNTER — Other Ambulatory Visit: Payer: Self-pay | Admitting: Cardiology

## 2020-10-17 DIAGNOSIS — I2510 Atherosclerotic heart disease of native coronary artery without angina pectoris: Secondary | ICD-10-CM

## 2020-10-17 DIAGNOSIS — I4819 Other persistent atrial fibrillation: Secondary | ICD-10-CM

## 2020-10-17 NOTE — Progress Notes (Signed)
Telephone encounter: Patient had called to reconcile his medications as he was recently discharged from rehab.  Patient states that he was currently taking aspirin, Lopressor 12.5 mg p.o. daily, Crestor 10 mg p.o. nightly, Xarelto, amlodipine 10 mg p.o. nightly, and olmesartan 40 mg p.o. daily.  While he was hospitalized at [facility] long he was having symptoms of orthostatic hypotension and we had discontinued amlodipine and olmesartan.  Just kept him on Lopressor to help improve his ventricular rate given his atrial fibrillation.  For short period of time he was also started on midodrine during his recent hospitalization at [facility].  Patient states that he is overall doing well.  But he does have shortness of breath which she is concerned about.  Patient well with stable and less symptoms of lightheadedness and dizziness with changing positions.    ICD-10-CM   1. Atherosclerosis of native coronary artery of native heart without angina pectoris  I25.10 Basic metabolic panel    Pro b natriuretic peptide (BNP)    Magnesium  2. Persistent atrial fibrillation (HCC)  I48.19    . Orders Placed This Encounter  Procedures  . Basic metabolic panel    Standing Status:   Future    Standing Expiration Date:   10/17/2021  . Pro b natriuretic peptide (BNP)    Standing Status:   Future    Standing Expiration Date:   10/17/2021  . Magnesium    Standing Status:   Future    Standing Expiration Date:   10/17/2021   Recommendations For now I recommended holding amlodipine given his history of orthostatic hypotension. Patient is also aware of holding olmesartan if he has symptoms suggestive of orthostatic hypotension. We will check blood work to evaluate kidney function and NT proBNP prior to next office visit. Patient is asked to bring his cardiac medications in at the next office visit on 10/22/2020.  Telephone encounter: 12 minutes.  Tessa Lerner, [place], [facility]  Pager:  [phone] Office: [phone]

## 2020-10-21 LAB — BASIC METABOLIC PANEL
BUN/Creatinine Ratio: 11 (ref 10–24)
BUN: 10 mg/dL (ref 8–27)
CO2: 25 mmol/L (ref 20–29)
Calcium: 9.1 mg/dL (ref 8.6–10.2)
Chloride: 103 mmol/L (ref 96–106)
Creatinine, Ser: 0.9 mg/dL (ref 0.76–1.27)
GFR calc Af Amer: 102 mL/min/{1.73_m2} (ref >59)
GFR calc non Af Amer: 88 mL/min/{1.73_m2} (ref >59)
Glucose: 102 mg/dL — ABNORMAL HIGH (ref 65–99)
Potassium: 4.5 mmol/L (ref 3.5–5.2)
Sodium: 142 mmol/L (ref 134–144)

## 2020-10-21 LAB — PRO B NATRIURETIC PEPTIDE: NT-Pro BNP: 2653 pg/mL — ABNORMAL HIGH (ref 0–376)

## 2020-10-21 LAB — MAGNESIUM: Magnesium: 1.9 mg/dL (ref 1.6–2.3)

## 2020-10-22 ENCOUNTER — Other Ambulatory Visit: Payer: Self-pay

## 2020-10-22 ENCOUNTER — Ambulatory Visit: Payer: Medicare Other | Admitting: Cardiology

## 2020-10-22 ENCOUNTER — Encounter: Payer: Self-pay | Admitting: Cardiology

## 2020-10-22 VITALS — BP 102/85 | HR 75 | Resp 16 | Ht 74.0 in | Wt 222.0 lb

## 2020-10-22 DIAGNOSIS — I2510 Atherosclerotic heart disease of native coronary artery without angina pectoris: Secondary | ICD-10-CM

## 2020-10-22 DIAGNOSIS — I5021 Acute systolic (congestive) heart failure: Secondary | ICD-10-CM

## 2020-10-22 DIAGNOSIS — Z9889 Other specified postprocedural states: Secondary | ICD-10-CM

## 2020-10-22 DIAGNOSIS — E782 Mixed hyperlipidemia: Secondary | ICD-10-CM

## 2020-10-22 DIAGNOSIS — I6522 Occlusion and stenosis of left carotid artery: Secondary | ICD-10-CM

## 2020-10-22 DIAGNOSIS — I4819 Other persistent atrial fibrillation: Secondary | ICD-10-CM

## 2020-10-22 DIAGNOSIS — Z09 Encounter for follow-up examination after completed treatment for conditions other than malignant neoplasm: Secondary | ICD-10-CM

## 2020-10-22 DIAGNOSIS — I129 Hypertensive chronic kidney disease with stage 1 through stage 4 chronic kidney disease, or unspecified chronic kidney disease: Secondary | ICD-10-CM

## 2020-10-22 DIAGNOSIS — R0602 Shortness of breath: Secondary | ICD-10-CM

## 2020-10-22 DIAGNOSIS — Z8679 Personal history of other diseases of the circulatory system: Secondary | ICD-10-CM

## 2020-10-22 DIAGNOSIS — I447 Left bundle-branch block, unspecified: Secondary | ICD-10-CM

## 2020-10-22 DIAGNOSIS — Z951 Presence of aortocoronary bypass graft: Secondary | ICD-10-CM

## 2020-10-22 DIAGNOSIS — Z7901 Long term (current) use of anticoagulants: Secondary | ICD-10-CM

## 2020-10-22 MED ORDER — BUMETANIDE 0.5 MG PO TABS: 0.5000 mg | ORAL_TABLET | Freq: Every morning | ORAL | 0 refills | Status: DC

## 2020-10-22 MED ORDER — OLMESARTAN MEDOXOMIL 20 MG PO TABS: 20.0000 mg | ORAL_TABLET | Freq: Every evening | ORAL | 0 refills | Status: DC

## 2020-10-22 NOTE — Progress Notes (Signed)
[name] Rice Date of Birth: 03/19/1951 MRN: [id number] Primary Care Provider:Kaplan, Isidor Holts., PA-C Former Cardiology Providers: Dr. Rudean Hitt, APRN, FNP-C Primary Cardiologist: Tessa Lerner, DO, [facility] (established care 01/14/2020)  Date: 10/22/20 Last Office Visit: 07/03/2020 and last hospital visit 09/29/2020  Chief Complaint  Patient presents with  . Coronary Artery Disease  . Persistent atrial fibrillation   . Follow-up    Post Rehab  . Hospitalization Follow-up  . Shortness of Breath   HPI  [name] Rice is a 69 y.o.  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  In the past underwent workup for near syncope and was noted to have multiple episodes of NSVT and ventricular standstill reaching up to 9 seconds in duration on the monitor. Than underwent LHC and was noted to have multivessel CAD and patient was recommended to undergo bypass surgery.  He underwent four-vessel bypass surgery in September 2021 with Dr. Vickey Sages.  He did well post surgery.   However, since last OV he went to [facility] for generalized weakness, nausea / vomiting. He has diagnosed with acute cholecystitis status post ERCP, MRCP, and partial laparoscopic cholecystectomy. His hospitalization was  prolonged due to needing IV Antibiotics and symptomatic orthostatic hypotension.   Once stable went to [facility] and later transitioned home. He is doing well; however, called last week stated he was short of breath and hypotension. He we asked to stop norvasc and check blood work. Labs 10/20/2020 independently reviewed.   Patient denies any chest pain or anginal equivalent.  His shortness of breath has improved since last week.  His blood pressures are trending low at today's office visit but overall asymptomatic.  In the hospital he was wearing lower extremity compression stockings as well as an abdominal binder to help maintain MAP.  He has some swelling in the legs that he has been noticing but is not wearing compression stockings.  He also has possible oral intake and his albumin level is low.  However overall, patient states that he is able to walk longer distances without becoming shortness of breath.  And his family members have noticed a significant improvement in him since discharge from the hospital.  ALLERGIES: No Known Allergies  MEDICATION LIST PRIOR TO VISIT: Current Outpatient Medications on File Prior to Visit  Medication Sig Dispense Refill  . acetaminophen (TYLENOL) 500 MG tablet Take 1,000 mg by mouth every 6 (six) hours as needed for moderate pain.     Marland Kitchen aspirin EC 81 MG tablet Take 81 mg by mouth daily. Swallow whole.    . Ensure (ENSURE) Take 1 Can by mouth See admin instructions. Drink 1 can of VANILLA ENSURE by mouth once a day    . famotidine (PEPCID) 20 MG tablet Take 1 tablet (20 mg total) by mouth 2 (two) times daily.    . metoprolol tartrate (LOPRESSOR) 25 MG tablet Take 0.5 tablets (12.5 mg total) by mouth daily. (Patient taking differently: Take 12.5  mg by mouth in the morning.)    . rosuvastatin (CRESTOR) 40 MG tablet Take 1 tablet (40 mg total) by mouth daily. (Patient taking differently: Take 40 mg by mouth at bedtime.)    . XARELTO 20 MG TABS tablet TAKE 1  TABLET BY MOUTH IN  THE EVENING AFTER DINNER (Patient taking differently: Take 20 mg by mouth daily with supper.) 90 tablet 3   No current facility-administered medications on file prior to visit.    PAST MEDICAL HISTORY: Past Medical History:  Diagnosis Date  . Atrial fibrillation (HCC)   . Coronary artery disease   . Dysrhythmia   . Hypertension   . LBBB (left bundle branch block)     PAST SURGICAL HISTORY: Past Surgical History:  Procedure Laterality Date  . CARDIOVERSION N/A 09/28/2016   Procedure: CARDIOVERSION;  Surgeon: Yates Decamp, MD;  Location: [facility] ENDOSCOPY;  Service: Cardiovascular;  Laterality: N/A;  . CHOLECYSTECTOMY N/A 09/18/2020   Procedure: LAPAROSCOPIC PARTIAL CHOLECYSTECTOMY;  Surgeon: Ovidio Kin, MD;  Location: [facility] ORS;  Service: General;  Laterality: N/A;  . CLIPPING OF ATRIAL APPENDAGE Left 07/29/2020   Procedure: CLIPPING OF ATRIAL APPENDAGE;  Surgeon: Linden Dolin, MD;  Location: [facility] OR;  Service: Open Heart Surgery;  Laterality: Left;  . CORONARY ARTERY BYPASS GRAFT N/A 07/29/2020   Procedure: CORONARY ARTERY BYPASS GRAFTING (CABG) TIMES FOUR USING BILATERAL INTERNAL MAMMARIES AND LEFT RADIAL ARTERY;  Surgeon: Linden Dolin, MD;  Location: [facility] OR;  Service: Open Heart Surgery;  Laterality: N/A;  . ERCP N/A 09/17/2020   Procedure: ENDOSCOPIC RETROGRADE CHOLANGIOPANCREATOGRAPHY (ERCP);  Surgeon: Meryl Dare, MD;  Location: [facility] ENDOSCOPY;  Service: Endoscopy;  Laterality: N/A;  . LEFT HEART CATH AND CORONARY ANGIOGRAPHY N/A 07/25/2020   Procedure: LEFT HEART CATH AND CORONARY ANGIOGRAPHY;  Surgeon: Yates Decamp, MD;  Location: [facility] INVASIVE CV LAB;  Service: Cardiovascular;  Laterality: N/A;  . MAZE N/A 07/29/2020   Procedure: MAZE;  Surgeon: Linden Dolin, MD;  Location: [facility] OR;  Service: Open Heart Surgery;  Laterality: N/A;  . NO PAST SURGERIES    . RADIAL ARTERY HARVEST Left 07/29/2020   Procedure: RADIAL ARTERY HARVEST;  Surgeon: Linden Dolin,  MD;  Location: [facility] OR;  Service: Open Heart Surgery;  Laterality: Left;  . REMOVAL OF STONES  09/17/2020   Procedure: REMOVAL OF STONES;  Surgeon: Meryl Dare, MD;  Location: [facility] ENDOSCOPY;  Service: Endoscopy;;  . Dennison Mascot  09/17/2020   Procedure: SPHINCTEROTOMY;  Surgeon: Meryl Dare, MD;  Location: [facility] ENDOSCOPY;  Service: Endoscopy;;  . TEE WITHOUT CARDIOVERSION N/A 07/29/2020   Procedure: TRANSESOPHAGEAL ECHOCARDIOGRAM (TEE);  Surgeon: Linden Dolin, MD;  Location: [facility] OR;  Service: Open Heart Surgery;  Laterality: N/A;    FAMILY HISTORY: The patient's family history includes Hypertension in an other family member.   SOCIAL HISTORY:  The patient  reports that he has never smoked. He has never used smokeless tobacco. He reports that he does not drink alcohol and does not use drugs.  Review of Systems  Constitutional: Negative for chills and fever.  HENT: Negative for hoarse voice and nosebleeds.   Eyes: Negative for discharge, double vision and pain.  Cardiovascular: Negative for chest pain, claudication, dyspnea on exertion, leg swelling, near-syncope, orthopnea, palpitations, paroxysmal nocturnal dyspnea and syncope.  Respiratory: Positive for shortness of breath (improved.). Negative for cough and hemoptysis.   Musculoskeletal: Negative for muscle cramps and myalgias.  Gastrointestinal: Negative for abdominal pain, constipation, diarrhea, hematemesis, hematochezia, melena,  nausea and vomiting.  Neurological: Negative for dizziness and light-headedness.    PHYSICAL EXAM: Vitals with BMI 10/22/2020 10/22/2020 10/01/2020  Height - 6\' 2"  -  Weight - 222 lbs -  BMI - 28.49 -  Systolic 102 77 115  Diastolic 85 51 65  Pulse 75 75 90   CONSTITUTIONAL: Appears older than stated age, hemodynamically stable, no acute distress.   SKIN: Skin is warm and dry. No rash noted. No cyanosis. No pallor. No jaundice HEAD: Normocephalic and atraumatic.  EYES: No scleral  icterus MOUTH/THROAT: Moist oral membranes.  NECK: No JVD present. No thyromegaly noted. No carotid bruits  LYMPHATIC: No visible cervical adenopathy.  CHEST Normal respiratory effort. No intercostal retractions.  Sternotomy site is healing well. LUNGS: Clear to auscultation in the upper lung fields with decreased breath sounds at the bases, No stridor. No wheezes. No rales.  CARDIOVASCULAR: Irregularly irregular, positive S1-S2, no murmurs rubs or gallops appreciated. ABDOMINAL: No apparent ascites.  EXTREMITIES: trace peripheral edema.  Left radial site is healing well. HEMATOLOGIC: No significant bruising. NEUROLOGIC: Oriented to person, place, and time. Nonfocal. Normal muscle tone.  PSYCHIATRIC: Normal mood and affect. Normal behavior. Cooperative  RADIOLOGY: 08/11/2020 CXR:  Persistent left pleural effusion with left base atelectasis. Scattered calcified granulomas noted. Lungs elsewhere clear. Stable cardiac enlargement with postoperative changes. Aortic Atherosclerosis  CARDIAC DATABASE: EKG: 10/22/2020: Atrial fibrillation, 73 bpm, left bundle branch block, without underlying injury pattern.  Coronary artery bypass grafting: 07/29/2020 (by Dr. 07/31/2020 at [facility]):  Left Internal Mammary Artery to Distal Left Anterior Descending Coronary Artery; pedicled RIMA Graft to Posterior Descending Coronary Artery; left radial artery  Graft to Obtuse Marginal Branch of Left Circumflex Coronary Artery and ramus intermedius as a sequenced graft. Bi-atrial Maze procedure and left atrial appendage clipping  Echocardiogram: 12/31/2019: Mildly depressed LV systolic function with visual EF 45-50%. Left ventricle cavity is normal in size. Left ventricle regional wall motion findings: Mid anteroseptal, Mid inferoseptal, Apical anterior, Apical septal and Apical cap hypokinesis. Mild left ventricular hypertrophy.  Unable to evaluate diastolic function due to atrial fibrillation.  Left atrial  cavity is severely dilated. Right atrial cavity is slightly dilated. Right ventricle cavity is normal in size. Low normal right ventricular function. Mild aortic valve leaflet thickening with mild calcification. Mildly restricted aortic valve leaflets. No evidence of aortic valve stenosis. No aortic valve regurgitation. Mild tricuspid regurgitation. Mild pulmonary hypertension. RVSP measures 35 mmHg. IVC is dilated with a respiratory response of >50%. Prior study 08/2016: Moderate concentric hypertrophy of the left ventricle. Normal global wall motion. Visual EF is 55-60%. Indeterminate diastolic filling pattern, indeterminate LAP due to A. Fib. Biatrial enlargement. Mild (Grade I) mitral regurgitation. Mild tricuspid regurgitation. No evidence of pulmonary hypertension.  09/27/2020: 1. Left ventricular ejection fraction, by estimation, is 45 to 50%,  although evaluation limited due to poor visualization of the endocardium.  The left ventricle has mildly decreased function. The left ventricle  demonstrates regional wall motion  abnormalities (see scoring diagram/findings for description). Left  ventricular diastolic parameters are indeterminate.  2. Right ventricular systolic function is normal. The right ventricular  size is normal.  3. Left atrial size was mildly dilated.  4. Right atrial size was mildly dilated.  5. Normal right atrial pressure.  6. Compared to previous study on 07/24/2020, post-op changes are new.   Stress Testing:  02/18/2020: No previous exam available for comparison. Lexiscan nuclear stress test performed using 1-day protocol. Stress EKG is non-diagnostic, as this is  pharmacological stress test. In addition, rest and stress EKG showed atrial fibrillation with slow ventricular response, anterolateral T wave inversion. Stress LVEF 77%. Normal myocardial perfusion. Low risk study.  Heart Catheterization: 07/25/20: RCA: Proximal RCA 80% stenosis, large vessel  with mild disease in PL and PDA branches. A secondary PL branch is subtotally occluded. LM: Distal 30-40% stenosis. LAD: LAD diffusely diseased, proximal diffuse 80% followed by tandem 90% stenosis. Large D1 with moderate diffuse disease and proximal and mid tandem 70 and 80% stenosis. Has secondary branches and is tortuous. Cx and RI: Co-dominant Cx with Ostial circumflex 99% involving a moderate sized ramus with ostial 80 to 90% and proximal 90% stenosis. LV: Normal LVEDP. No pressure gradient across the aortic valve. Patent LIMA and RIMA and RIMA has ostial 20-30% stenosis. Subclavian arteries widely patent.  Right radial diffusely disease by [place]. 65mL contrast used.   Carotid duplex: 07/27/2020:  Right Carotid: Velocities in the right ICA are consistent with a 1-39% stenosis.  Left Carotid: Velocities in the left ICA are consistent with a 60-79% stenosis.  Vertebrals: Bilateral vertebral arteries demonstrate antegrade flow.   14 day extended Holter monitor: Dominant rhythm atrial fibrillation (HR between 25-137bpm, avg. 59bpm).  Overall HR 25-197 bpm. Avg HR 59 bpm. 1519 pauses that were 3 secs or longer.  The longest pause 9.5 sec at 12:36 AM (07/04/2020), followed by 9.4 sec pause at 4:30 AM (07/11/2020), third longest pause 8.9 sec (07/08/2020). These are auto-triggered events. 20 episodes of NSVT reported. Longest episode 20 beats in duration for 10.5 seconds at an average rate 116 bpm. The fastest episode was 6 beats in duration at average rate of 170 bpm.  Total ventricular ectopic burden <1%. Patient activated events: 0.  LABORATORY DATA: CBC Latest Ref Rng & Units 10/01/2020 09/30/2020 09/29/2020  WBC 4.0 - 10.5 K/uL 9.0 8.7 10.2  Hemoglobin 13.0 - 17.0 g/dL 10.6(L) 10.5(L) 10.8(L)  Hematocrit 39.0 - 52.0 % 34.0(L) 35.2(L) 35.6(L)  Platelets 150 - 400 K/uL 303 294 289    CMP Latest Ref Rng & Units 10/20/2020 10/01/2020 09/30/2020  Glucose 65 - 99 mg/dL 409(W) 96 94   BUN 8 - 27 mg/dL Creatinine 0.76 - 1.27 mg/dL 1.19 1.47 8.29  Sodium 134 - 144 mmol/L 142 132(L) 133(L)  Potassium 3.5 - 5.2 mmol/L 4.5 4.0 4.4  Chloride 96 - 106 mmol/L 103 99 97(L)  CO2 20 - 29 mmol/L Calcium 8.6 - 10.2 mg/dL 9.1 8.0(L) 8.1(L)  Total Protein 6.5 - 8.1 g/dL - 5.6(L) 5.7(L)  Total Bilirubin 0.3 - 1.2 mg/dL - 0.8 1.0  Alkaline Phos 38 - 126 U/L - 143(H) 139(H)  AST 15 - 41 U/L - 37 39  ALT 0 - 44 U/L - 27 27    Lipid Panel     Component Value Date/Time   CHOL 78 07/26/2020 0515   CHOL 143 07/10/2019 0906   TRIG 92 07/26/2020 0515   HDL 24 (L) 07/26/2020 0515   HDL 51 07/10/2019 0906   CHOLHDL 3.3 07/26/2020 0515   VLDL 18 07/26/2020 0515   LDLCALC 36 07/26/2020 0515   LDLCALC 74 07/10/2019 0906   LABVLDL 18 07/10/2019 0906    Lab Results  Component Value Date   HGBA1C 5.2 07/26/2020   No components found for: NTPROBNP Lab Results  Component Value Date   TSH 1.083 09/16/2020   TSH 0.941 07/24/2020   TSH 1.898 07/02/2020    Cardiac Panel (last 3  results) No results for input(s): CKTOTAL, CKMB, TROPONINIHS, RELINDX in the last 72 hours.  IMPRESSION:    ICD-10-CM   1. Acute HFrEF (heart failure with reduced ejection fraction) (HCC)  I50.21 EKG 12-Lead    olmesartan (BENICAR) 20 MG tablet    bumetanide (BUMEX) 0.5 MG tablet    Basic metabolic panel    Pro b natriuretic peptide (BNP)    Magnesium  2. Shortness of breath  R06.02   3. Atherosclerosis of native coronary artery of native heart without angina pectoris  I25.10 EKG 12-Lead  4. Persistent atrial fibrillation (HCC)  I48.19   5. Hx of CABG  Z95.1   6. S/P Maze operation for atrial fibrillation  Z98.890    Z86.79   7. S/P left atrial appendage ligation  Z98.890   8. Long term current use of anticoagulant  Z79.01   9. Left bundle branch block  I44.7   10. Mixed hyperlipidemia  E78.2   11. Benign hypertension with CKD (chronic kidney disease) stage III (HCC)  I12.9  olmesartan (BENICAR) 20 MG tablet   N18.30 bumetanide (BUMEX) 0.5 MG tablet    Basic metabolic panel    Pro b natriuretic peptide (BNP)    Magnesium  12. Stenosis of left carotid artery  I65.22   13. Hospital discharge follow-up  Z09      RECOMMENDATIONS: ABBY STINES is a 69 y.o. 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  Acute on chronic HFrEF, stage C, NYHA class II/III:   Continue GDMT.   Will start Bumex 0.5mg  po qAM. Due to hypoalbuminemia we will hold off initiating Lasix.  Labs in one week to recheck BMP, Mg, and NTprobnp  Reduce salt intake.   Recent echo results reviewed.   Shortness of breath: Improving / See above.    Multivessel CAD s/p four-vessel bypass without angina pectoris:  Medications reconciled.  Continue Lopressor to 12.5 mg p.o. every morning.  Decrease olmesartan to 20 mg p.o. every afternoon given the low blood pressure.  Will start Bumex 0.5 mg p.o. every morning given the elevated NT proBNP and lower extremity swelling.  Recent check blood work in 1 week to evaluate kidney function and electrolytes.  Will uptitrate guideline directed medical therapy slowly given his hemodynamics.  Persistent atrial fibrillation, status post biatrial maze procedure:  Rate control: Beta-blocker therapy.  Rhythm control: NA (patient was on amiodarone in the past).  Thromboembolic prophylaxis: Xarelto.  Status post left atrial appendage  clipping.  History of failed cardioversion in the past.  Continue current medical management.  Long-term oral anticoagulation:   CHA2DS2-VASc SCORE is 4 which correlates to 4.0 % risk of stroke per year (LVEF45-50%, HTN, Vascular disease, Age).  Reemphasized the risks, benefits, and alternatives to oral anticoagulation.  Asymptomatic left carotid artery stenosis:  Found on pre-CABG work-up.  Currently asymptomatic.    Follow-up study scheduled for 01/2021.  Continue aspirin and statin   Benign essential hypertension:  BP trending low.   Educated on the importance of increasing protein and reducing salt in the diet.  Patient's albumin levels are low which are contributing to low oncotic  pressure causing lower extremity swelling and third spacing.    We will decrease olmesartan to 20 mg p.o. every afternoon   FINAL MEDICATION LIST END OF ENCOUNTER: Meds ordered this encounter  Medications  . olmesartan (BENICAR) 20 MG tablet    Sig: Take 1 tablet (20 mg total) by mouth every evening.    Dispense:  30 tablet    Refill:  0  . bumetanide (BUMEX) 0.5 MG tablet    Sig: Take 1 tablet (0.5 mg total) by mouth in the morning.    Dispense:  30 tablet    Refill:  0     Current Outpatient Medications:  .  acetaminophen (TYLENOL) 500 MG tablet, Take 1,000 mg by mouth every 6 (six) hours as needed for moderate pain. , Disp: , Rfl:  .  aspirin EC 81 MG tablet, Take 81 mg by mouth daily. Swallow whole., Disp: , Rfl:  .  Ensure (ENSURE), Take 1 Can by mouth See admin instructions. Drink 1 can of VANILLA ENSURE by mouth once a day, Disp: , Rfl:  .  famotidine (PEPCID) 20 MG tablet, Take 1 tablet (20 mg total) by mouth 2 (two) times daily., Disp: , Rfl:  .  metoprolol tartrate (LOPRESSOR) 25 MG tablet, Take 0.5 tablets (12.5 mg total) by mouth daily. (Patient taking differently: Take 12.5 mg by mouth in the morning.), Disp: , Rfl:  .  rosuvastatin (CRESTOR) 40 MG tablet, Take 1 tablet (40 mg  total) by mouth daily. (Patient taking differently: Take 40 mg by mouth at bedtime.), Disp: , Rfl:  .  XARELTO 20 MG TABS tablet, TAKE 1 TABLET BY MOUTH IN  THE EVENING AFTER DINNER (Patient taking differently: Take 20 mg by mouth daily with supper.), Disp: 90 tablet, Rfl: 3 .  bumetanide (BUMEX) 0.5 MG tablet, Take 1 tablet (0.5 mg total) by mouth in the morning., Disp: 30 tablet, Rfl: 0 .  olmesartan (BENICAR) 20 MG tablet, Take 1 tablet (20 mg total) by mouth every evening., Disp: 30 tablet, Rfl: 0  Orders Placed This Encounter  Procedures  . Basic metabolic panel  . Pro b natriuretic peptide (BNP)  . Magnesium  . EKG 12-Lead   --Continue cardiac medications as reconciled in final medication list. --Return in about 4 weeks (around 11/19/2020) for Follow up, CAD, low blood pressure, and recent medication changes. . Or sooner if needed. --Continue follow-up with your primary care physician regarding the management of your other chronic comorbid conditions.  Patient's questions and concerns were addressed to his satisfaction. He voices understanding of the instructions provided during this encounter.   This note was created using a voice recognition software as a result there may be grammatical errors inadvertently enclosed that do not reflect the nature of this encounter. Every attempt is made to correct such errors.  Total time spent: 35 minutes.  Tessa LernerSunit Mylei Brackeen, [place]DO, [facility]  Pager: [phone] Office: [phone]

## 2020-10-24 ENCOUNTER — Telehealth: Payer: Self-pay | Admitting: Cardiology

## 2020-10-30 ENCOUNTER — Ambulatory Visit: Payer: Medicare Other | Admitting: Cardiology

## 2020-11-06 ENCOUNTER — Other Ambulatory Visit: Payer: Self-pay | Admitting: Cardiology

## 2020-11-06 DIAGNOSIS — I5021 Acute systolic (congestive) heart failure: Secondary | ICD-10-CM

## 2020-11-06 DIAGNOSIS — N183 Chronic kidney disease, stage 3 unspecified: Secondary | ICD-10-CM

## 2020-11-06 NOTE — Telephone Encounter (Signed)
Done

## 2020-11-13 ENCOUNTER — Other Ambulatory Visit: Payer: Self-pay | Admitting: Cardiology

## 2020-11-13 DIAGNOSIS — I129 Hypertensive chronic kidney disease with stage 1 through stage 4 chronic kidney disease, or unspecified chronic kidney disease: Secondary | ICD-10-CM

## 2020-11-13 DIAGNOSIS — I5021 Acute systolic (congestive) heart failure: Secondary | ICD-10-CM

## 2020-11-13 DIAGNOSIS — N183 Chronic kidney disease, stage 3 unspecified: Secondary | ICD-10-CM

## 2020-11-14 LAB — LDL CHOLESTEROL, DIRECT: LDL Direct: 73 mg/dL (ref 0–99)

## 2020-11-14 LAB — CMP14+EGFR
ALT: 14 IU/L (ref 0–44)
AST: 26 IU/L (ref 0–40)
Albumin/Globulin Ratio: 1.1 — ABNORMAL LOW (ref 1.2–2.2)
Albumin: 3.3 g/dL — ABNORMAL LOW (ref 3.8–4.8)
Alkaline Phosphatase: 134 IU/L — ABNORMAL HIGH (ref 44–121)
BUN/Creatinine Ratio: 10 (ref 10–24)
BUN: 10 mg/dL (ref 8–27)
Bilirubin Total: 1.6 mg/dL — ABNORMAL HIGH (ref 0.0–1.2)
CO2: 21 mmol/L (ref 20–29)
Calcium: 8.7 mg/dL (ref 8.6–10.2)
Chloride: 100 mmol/L (ref 96–106)
Creatinine, Ser: 1.04 mg/dL (ref 0.76–1.27)
GFR calc Af Amer: 85 mL/min/{1.73_m2} (ref >59)
GFR calc non Af Amer: 74 mL/min/{1.73_m2} (ref >59)
Globulin, Total: 3 g/dL (ref 1.5–4.5)
Glucose: 108 mg/dL — ABNORMAL HIGH (ref 65–99)
Potassium: 4.8 mmol/L (ref 3.5–5.2)
Sodium: 138 mmol/L (ref 134–144)
Total Protein: 6.3 g/dL (ref 6.0–8.5)

## 2020-11-14 LAB — LIPOPROTEIN A (LPA): Lipoprotein (a): <8.4 nmol/L (ref <75.0)

## 2020-11-14 LAB — APO A1 + B + RATIO
Apolipo. B/A-1 Ratio: 0.6 ratio (ref 0.0–0.7)
Apolipoprotein A-1: 120 mg/dL (ref 101–178)
Apolipoprotein B: 74 mg/dL (ref <90)

## 2020-11-14 LAB — LIPID PANEL WITH LDL/HDL RATIO
Cholesterol, Total: 129 mg/dL (ref 100–199)
HDL: 40 mg/dL (ref >39)
LDL Chol Calc (NIH): 73 mg/dL (ref 0–99)
LDL/HDL Ratio: 1.8 ratio (ref 0.0–3.6)
Triglycerides: 82 mg/dL (ref 0–149)
VLDL Cholesterol Cal: 16 mg/dL (ref 5–40)

## 2020-11-14 LAB — TSH: TSH: 1.61 u[IU]/mL (ref 0.450–4.500)

## 2020-11-14 LAB — HEMOGLOBIN AND HEMATOCRIT, BLOOD
Hematocrit: 41.6 % (ref 37.5–51.0)
Hemoglobin: 13.4 g/dL (ref 13.0–17.7)

## 2020-11-19 ENCOUNTER — Ambulatory Visit: Payer: Medicare Other | Admitting: Cardiology

## 2020-11-19 ENCOUNTER — Encounter: Payer: Self-pay | Admitting: Cardiology

## 2020-11-19 ENCOUNTER — Other Ambulatory Visit: Payer: Self-pay

## 2020-11-19 VITALS — BP 111/86 | HR 79 | Resp 16 | Ht 74.0 in | Wt 213.8 lb

## 2020-11-19 DIAGNOSIS — E782 Mixed hyperlipidemia: Secondary | ICD-10-CM

## 2020-11-19 DIAGNOSIS — Z8679 Personal history of other diseases of the circulatory system: Secondary | ICD-10-CM

## 2020-11-19 DIAGNOSIS — I447 Left bundle-branch block, unspecified: Secondary | ICD-10-CM

## 2020-11-19 DIAGNOSIS — I2510 Atherosclerotic heart disease of native coronary artery without angina pectoris: Secondary | ICD-10-CM

## 2020-11-19 DIAGNOSIS — I4819 Other persistent atrial fibrillation: Secondary | ICD-10-CM

## 2020-11-19 DIAGNOSIS — Z9889 Other specified postprocedural states: Secondary | ICD-10-CM

## 2020-11-19 DIAGNOSIS — Z7901 Long term (current) use of anticoagulants: Secondary | ICD-10-CM

## 2020-11-19 DIAGNOSIS — I129 Hypertensive chronic kidney disease with stage 1 through stage 4 chronic kidney disease, or unspecified chronic kidney disease: Secondary | ICD-10-CM

## 2020-11-19 DIAGNOSIS — I5021 Acute systolic (congestive) heart failure: Secondary | ICD-10-CM

## 2020-11-19 DIAGNOSIS — Z951 Presence of aortocoronary bypass graft: Secondary | ICD-10-CM

## 2020-11-19 DIAGNOSIS — I6522 Occlusion and stenosis of left carotid artery: Secondary | ICD-10-CM

## 2020-11-19 MED ORDER — OLMESARTAN MEDOXOMIL 20 MG PO TABS: 10.0000 mg | ORAL_TABLET | Freq: Every evening | ORAL | 0 refills | Status: DC

## 2020-11-19 NOTE — Progress Notes (Signed)
[name] Rice Date of Birth: 18-Apr-1951 MRN: [id number] Primary Care Provider:Kaplan, Isidor Holts., PA-C Former Cardiology Providers: Dr. Rudean Hitt, APRN, FNP-C Primary Cardiologist: Tessa Lerner, DO, [facility] (established care 01/14/2020)  Date: 11/19/20 Last Office Visit: 10/22/2020  Chief Complaint  Patient presents with  . Coronary Artery Disease  . Follow-up  . Atrial Fibrillation   HPI  [name] Rice is a 70 y.o.  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  In the past underwent workup for near syncope and was noted to have multiple episodes of NSVT and ventricular standstill reaching up to 9 seconds in duration on the monitor. Than underwent LHC and was noted to have multivessel CAD and patient was recommended to undergo bypass surgery. He underwent four-vessel bypass surgery in September 2021 with Dr. Vickey Sages and did well.  S/P CABG continued to have generalized weakness, nausea / vomiting and went to [facility] where he was diagnosed with acute cholecystitis status post ERCP, MRCP, and partial laparoscopic cholecystectomy. His hospitalization was prolonged due to needing IV Antibiotics and symptomatic orthostatic hypotension.   S/P discharge he was transferred to [facility] and later transitioned home.  At last  office visit the shared decision was to restart Bumex and olmesartan.  However, patient read side effects of the medication and as result did not start taking the medications as prescribed because he was concerned of hypotension.  He now presents for 1 month follow-up.  He states that he feels better in regards to more energetic, ambulating without support, and started to drive, and blood pressure remained stable.  At times he does have episodes of diarrhea which is still concerning him.  He had recent blood work done on November 13, 2020 which is independently reviewed with the patient.  Patient's LDL remains relatively stable, TSH within normal limits, hemoglobin is trending up, albumin has improved, alkaline phosphatase improving but still above normal limits.  Patient is asked to reach out to his PCP and/or general surgeon in regards to why his alkaline phosphatase is still elevated.  Will defer further management to PCP and general surgery for now.  Patient verbalized understanding and states that he will reach out.  ALLERGIES: No Known Allergies  MEDICATION LIST PRIOR TO VISIT: Current Outpatient Medications on File Prior to Visit  Medication Sig Dispense Refill  . acetaminophen (TYLENOL) 500 MG tablet Take 1,000 mg by mouth every 6 (six) hours as needed for moderate pain.     Marland Kitchen aspirin EC 81 MG tablet Take 81 mg by mouth daily. Swallow whole.    . Ensure (ENSURE) Take 1 Can by mouth See admin instructions. Drink 1 can of VANILLA ENSURE by mouth once a day    . famotidine (PEPCID) 20 MG tablet Take 1 tablet (20 mg total) by mouth 2 (two) times daily.    . metoprolol tartrate (LOPRESSOR) 25 MG tablet Take 0.5 tablets (12.5 mg total) by mouth daily. (Patient taking differently: Take 12.5 mg by mouth in the morning.)    .  rosuvastatin (CRESTOR) 40 MG tablet Take 1 tablet (40 mg total) by mouth daily. (Patient taking differently: Take 40 mg by mouth at bedtime.)    . XARELTO 20 MG TABS tablet TAKE 1  TABLET BY MOUTH IN  THE EVENING AFTER DINNER (Patient taking differently: Take 20 mg by mouth daily with supper.) 90 tablet 3   No current facility-administered medications on file prior to visit.    PAST MEDICAL HISTORY: Past Medical History:  Diagnosis Date  . Atrial fibrillation (HCC)   . Coronary artery disease   . Dysrhythmia   . Hypertension   . LBBB (left bundle branch block)     PAST SURGICAL HISTORY: Past Surgical History:  Procedure Laterality Date  . CARDIOVERSION N/A 09/28/2016   Procedure: CARDIOVERSION;  Surgeon: Yates Decamp, MD;  Location: [facility] ENDOSCOPY;  Service: Cardiovascular;  Laterality: N/A;  . CHOLECYSTECTOMY N/A 09/18/2020   Procedure: LAPAROSCOPIC PARTIAL CHOLECYSTECTOMY;  Surgeon: Ovidio Kin, MD;  Location: [facility] ORS;  Service: General;  Laterality: N/A;  . CLIPPING OF ATRIAL APPENDAGE Left 07/29/2020   Procedure: CLIPPING OF ATRIAL APPENDAGE;  Surgeon: Linden Dolin, MD;  Location: [facility] OR;  Service: Open Heart Surgery;  Laterality: Left;  . CORONARY ARTERY BYPASS GRAFT N/A 07/29/2020   Procedure: CORONARY ARTERY BYPASS GRAFTING (CABG) TIMES FOUR USING BILATERAL INTERNAL MAMMARIES AND LEFT RADIAL ARTERY;  Surgeon: Linden Dolin, MD;  Location: [facility] OR;  Service: Open Heart Surgery;  Laterality: N/A;  . ERCP N/A 09/17/2020   Procedure: ENDOSCOPIC RETROGRADE CHOLANGIOPANCREATOGRAPHY (ERCP);  Surgeon: Meryl Dare, MD;  Location: [facility];  Service: Endoscopy;  Laterality: N/A;  . LEFT HEART CATH AND CORONARY ANGIOGRAPHY N/A 07/25/2020   Procedure: LEFT HEART CATH AND CORONARY ANGIOGRAPHY;  Surgeon: Yates Decamp, MD;  Location: [facility] INVASIVE CV LAB;  Service: Cardiovascular;  Laterality: N/A;  . MAZE N/A 07/29/2020   Procedure: MAZE;  Surgeon: Linden Dolin, MD;  Location: [facility] OR;  Service: Open Heart Surgery;  Laterality: N/A;  . NO PAST SURGERIES    . RADIAL ARTERY HARVEST Left 07/29/2020   Procedure: RADIAL ARTERY HARVEST;  Surgeon: Linden Dolin,  MD;  Location: [facility] OR;  Service: Open Heart Surgery;  Laterality: Left;  . REMOVAL OF STONES  09/17/2020   Procedure: REMOVAL OF STONES;  Surgeon: Meryl Dare, MD;  Location: [facility] ENDOSCOPY;  Service: Endoscopy;;  . Dennison Mascot  09/17/2020   Procedure: SPHINCTEROTOMY;  Surgeon: Meryl Dare, MD;  Location: [facility] ENDOSCOPY;  Service: Endoscopy;;  . TEE WITHOUT CARDIOVERSION N/A 07/29/2020   Procedure: TRANSESOPHAGEAL ECHOCARDIOGRAM (TEE);  Surgeon: Linden Dolin, MD;  [facility];  Service: Open Heart Surgery;  Laterality: N/A;    FAMILY HISTORY: The patient's family history includes Hypertension in an other family member.   SOCIAL HISTORY:  The patient  reports that he has never smoked. He has never used smokeless tobacco. He reports that he does not drink alcohol and does not use drugs.  Review of Systems  Constitutional: Negative for chills and fever.  HENT: Negative for hoarse voice and nosebleeds.   Eyes: Negative for discharge, double vision and pain.  Cardiovascular: Negative for chest pain, claudication, dyspnea on exertion, leg swelling, near-syncope, orthopnea, palpitations, paroxysmal nocturnal dyspnea and syncope.  Respiratory: Negative for cough, hemoptysis and shortness of breath.   Musculoskeletal: Negative for muscle cramps and myalgias.  Gastrointestinal: Positive for diarrhea. Negative for abdominal pain, constipation, hematemesis, hematochezia, melena, nausea and vomiting.  Neurological: Negative for dizziness and light-headedness.  PHYSICAL EXAM: Vitals with BMI 11/19/2020 10/22/2020 10/22/2020  Height 6\' 2"  - 6\' 2"   Weight 213 lbs 13 oz - 222 lbs  BMI 27.44 - 28.49  Systolic 111 102 77  Diastolic 86 85 51  Pulse 79 75 75   CONSTITUTIONAL: Appears older than stated age, hemodynamically stable, no acute distress.   SKIN: Skin is warm and dry. No rash noted. No cyanosis. No pallor. No jaundice HEAD: Normocephalic and atraumatic.  EYES: No scleral  icterus MOUTH/THROAT: Moist oral membranes.  NECK: No JVD present. No thyromegaly noted. No carotid bruits  LYMPHATIC: No visible cervical adenopathy.  CHEST Normal respiratory effort. No intercostal retractions.  Sternotomy site is healing well. LUNGS: Clear to auscultation in the upper lung fields with decreased breath sounds at the bases, No stridor. No wheezes. No rales.  CARDIOVASCULAR: Irregularly irregular, positive S1-S2, no murmurs rubs or gallops appreciated. ABDOMINAL: No apparent ascites.  EXTREMITIES: trace peripheral edema.  Left radial site is healing well. HEMATOLOGIC: No significant bruising. NEUROLOGIC: Oriented to person, place, and time. Nonfocal. Normal muscle tone.  PSYCHIATRIC: Normal mood and affect. Normal behavior. Cooperative  RADIOLOGY: 08/11/2020 CXR:  Persistent left pleural effusion with left base atelectasis. Scattered calcified granulomas noted. Lungs elsewhere clear. Stable cardiac enlargement with postoperative changes. Aortic Atherosclerosis  CARDIAC DATABASE: EKG: 10/22/2020: Atrial fibrillation, 73 bpm, left bundle branch block, without underlying injury pattern.  Coronary artery bypass grafting: 07/29/2020 [facility]  Left Internal Mammary Artery to Distal Left Anterior Descending Coronary Artery; pedicled RIMA Graft to Posterior Descending Coronary Artery; left radial artery  Graft to Obtuse Marginal Branch of Left Circumflex Coronary Artery and ramus intermedius as a sequenced graft. Bi-atrial Maze procedure and left atrial appendage clipping  Echocardiogram: 12/31/2019: Mildly depressed LV systolic function with visual EF 45-50%. Left ventricle cavity is normal in size. Left ventricle regional wall motion findings: Mid anteroseptal, Mid inferoseptal, Apical anterior, Apical septal and Apical cap hypokinesis. Mild left ventricular hypertrophy.  Unable to evaluate diastolic function due to atrial fibrillation.  Left atrial  cavity is severely dilated. Right atrial cavity is slightly dilated. Right ventricle cavity is normal in size. Low normal right ventricular function. Mild aortic valve leaflet thickening with mild calcification. Mildly restricted aortic valve leaflets. No evidence of aortic valve stenosis. No aortic valve regurgitation. Mild tricuspid regurgitation. Mild pulmonary hypertension. RVSP measures 35 mmHg. IVC is dilated with a respiratory response of >50%. Prior study 08/2016: Moderate concentric hypertrophy of the left ventricle. Normal global wall motion. Visual EF is 55-60%. Indeterminate diastolic filling pattern, indeterminate LAP due to A. Fib. Biatrial enlargement. Mild (Grade I) mitral regurgitation. Mild tricuspid regurgitation. No evidence of pulmonary hypertension.  09/27/2020: 1. Left ventricular ejection fraction, by estimation, is 45 to 50%,  although evaluation limited due to poor visualization of the endocardium.  The left ventricle has mildly decreased function. The left ventricle  demonstrates regional wall motion  abnormalities (see scoring diagram/findings for description). Left  ventricular diastolic parameters are indeterminate.  2. Right ventricular systolic function is normal. The right ventricular  size is normal.  3. Left atrial size was mildly dilated.  4. Right atrial size was mildly dilated.  5. Normal right atrial pressure.  6. Compared to previous study on 07/24/2020, post-op changes are new.   Stress Testing:  02/18/2020: No previous exam available for comparison. Lexiscan nuclear stress test performed using 1-day protocol. Stress EKG is non-diagnostic, as this is pharmacological stress test. In addition, rest and stress EKG  showed atrial fibrillation with slow ventricular response, anterolateral T wave inversion. Stress LVEF 77%. Normal myocardial perfusion. Low risk study.  Heart Catheterization: 07/25/20: RCA: Proximal RCA 80% stenosis, large vessel  with mild disease in PL and PDA branches. A secondary PL branch is subtotally occluded. LM: Distal 30-40% stenosis. LAD: LAD diffusely diseased, proximal diffuse 80% followed by tandem 90% stenosis. Large D1 with moderate diffuse disease and proximal and mid tandem 70 and 80% stenosis. Has secondary branches and is tortuous. Cx and RI: Co-dominant Cx with Ostial circumflex 99% involving a moderate sized ramus with ostial 80 to 90% and proximal 90% stenosis. LV: Normal LVEDP. No pressure gradient across the aortic valve. Patent LIMA and RIMA and RIMA has ostial 20-30% stenosis. Subclavian arteries widely patent.  Right radial diffusely disease by [place]. 65mL contrast used.   Carotid duplex: 07/27/2020:  Right Carotid: Velocities in the right ICA are consistent with a 1-39% stenosis.  Left Carotid: Velocities in the left ICA are consistent with a 60-79% stenosis.  Vertebrals: Bilateral vertebral arteries demonstrate antegrade flow.   14 day extended Holter monitor: Dominant rhythm atrial fibrillation (HR between 25-137bpm, avg. 59bpm).  Overall HR 25-197 bpm. Avg HR 59 bpm. 1519 pauses that were 3 secs or longer.  The longest pause 9.5 sec at 12:36 AM (07/04/2020), followed by 9.4 sec pause at 4:30 AM (07/11/2020), third longest pause 8.9 sec (07/08/2020). These are auto-triggered events. 20 episodes of NSVT reported. Longest episode 20 beats in duration for 10.5 seconds at an average rate 116 bpm. The fastest episode was 6 beats in duration at average rate of 170 bpm.  Total ventricular ectopic burden <1%. Patient activated events: 0.  LABORATORY DATA: CBC Latest Ref Rng & Units 11/13/2020 10/01/2020 09/30/2020  WBC 4.0 - 10.5 K/uL - 9.0 8.7  Hemoglobin 13.0 - 17.7 g/dL 43.1 10.6(L) 10.5(L)  Hematocrit 37.5 - 51.0 % 41.6 34.0(L) 35.2(L)  Platelets 150 - 400 K/uL - 303 294    CMP Latest Ref Rng & Units 11/13/2020 10/20/2020 10/01/2020  Glucose 65 - 99 mg/dL 540(G) 867(Y) 96  BUN 8 - 27  mg/dL 10 10 12   Creatinine 0.76 - 1.27 mg/dL 1.95 0.93  Sodium 134 - 144 mmol/L 138 142 132(L)  Potassium 3.5 - 5.2 mmol/L 4.8 4.5 4.0  Chloride 96 - 106 mmol/L 100 103 99  CO2 20 - 29 mmol/L 21 25 24   Calcium 8.6 - 10.2 mg/dL 8.7 9.1 8.0(L)  Total Protein 6.0 - 8.5 g/dL 6.3 - 5.6(L)  Total Bilirubin 0.0 - 1.2 mg/dL 2.67) - 0.8  Alkaline Phos 44 - 121 IU/L 134(H) - 143(H)  AST 0 - 40 IU/L 26 - 37  ALT 0 - 44 IU/L 14 - 27    Lipid Panel     Component Value Date/Time   CHOL 129 11/13/2020 1022   TRIG 82 11/13/2020 1022   HDL 40 11/13/2020 1022   CHOLHDL 3.3 07/26/2020 0515   VLDL 18 07/26/2020 0515   LDLCALC 73 11/13/2020 1022   LDLDIRECT 73 11/13/2020 1022   LABVLDL 16 11/13/2020 1022    Lab Results  Component Value Date   HGBA1C 5.2 07/26/2020   No components found for: NTPROBNP Lab Results  Component Value Date   TSH 1.610 11/13/2020   TSH 1.083 09/16/2020   TSH 0.941 07/24/2020    Cardiac Panel (last 3 results) No results for input(s): CKTOTAL, CKMB, TROPONINIHS, RELINDX in the last 72 hours.  IMPRESSION:    ICD-10-CM  1. Acute HFrEF (heart failure with reduced ejection fraction) (HCC)  I50.21 olmesartan (BENICAR) 20 MG tablet  2. Atherosclerosis of native coronary artery of native heart without angina pectoris  I25.10   3. Hx of CABG  Z95.1   4. S/P left atrial appendage ligation  Z98.890   5. Long term current use of anticoagulant  Z79.01   6. S/P Maze operation for atrial fibrillation  Z98.890    Z86.79   7. Left bundle branch block  I44.7   8. Persistent atrial fibrillation (HCC)  I48.19   9. Mixed hyperlipidemia  E78.2   10. Benign hypertension with CKD (chronic kidney disease) stage III (HCC)  I12.9 olmesartan (BENICAR) 20 MG tablet   N18.30   11. Stenosis of left carotid artery  I65.22      RECOMMENDATIONS: [name] Rice is a 70 y.o. male whose past medical history and cardiovascular risk factors include: Multivessel CAD status post  four-vessel bypass 07/2020, biatrial Maze procedure, clipping of the left atrial appendage, asymptomatic left carotid artery stenosis, history of nonsustained ventricular tachycardia and ventricular standstill, persistent atrial fibrillation, hypertension, hyperlipidemia, left bundle branch block, obesity due to excess calories, recently hospitalized for acute cholecystitis status post ERCP, MRCP, and partial laparoscopic cholecystectomy, obesity due to excess calories.  Chronic HFrEF, stage C, NYHA class II:   Continue GDMT.   Patient is reluctant to start ARB/diuretic due to fear of hypotension.  For now we will discontinue Bumex.  And decrease olmesartan to 10 mg p.o. every afternoon.  Patient is asked to hold blood pressure medications as a systolic blood pressure is less than 110 mmHg.  Most recent labs from 11/13/2020 independently reviewed with the patient during today's encounter.  Continue to monitor symptoms.  Shortness of breath: Improving / See above.    Multivessel CAD s/p four-vessel bypass without angina pectoris:  Medications reconciled.  Continue Lopressor to 12.5 mg p.o. every morning.  Decrease olmesartan to 10 mg p.o. every afternoon given the low blood pressure.  Will uptitrate guideline directed medical therapy slowly given his hemodynamics.  Independently reviewed labs from 11/13/2020  Persistent atrial fibrillation, status post biatrial maze procedure:  Rate control: Beta-blocker therapy.  Rhythm control: NA (patient was on amiodarone in the past).  Thromboembolic prophylaxis: Xarelto.  Status post left atrial appendage clipping.  History of failed cardioversion in the past.  Continue current medical management.  Long-term oral anticoagulation:   CHA2DS2-VASc SCORE is 4 which correlates to 4.0 % risk of stroke per year (LVEF45-50%, HTN, Vascular disease, Age).  Reemphasized the risks, benefits, and alternatives to oral anticoagulation.  Recent labs  11/13/2020 noted improvement in Hb from 10.6 to 13.4g/dL.   Asymptomatic left carotid artery stenosis:  Found on pre-CABG work-up.  Currently asymptomatic.    Follow-up study scheduled for 01/2021.  Continue aspirin and statin   Benign essential hypertension: Improving.  Office blood pressure is improving.  Continue Lopressor.  As noted above, olmesartan 10 mg p.o. every afternoon  Educated on the importance of increasing protein and reducing salt in the diet.  Patient's albumin levels are low which are contributing to low oncotic pressure causing lower extremity swelling and third spacing.    FINAL MEDICATION LIST END OF ENCOUNTER: Meds ordered this encounter  Medications  . olmesartan (BENICAR) 20 MG tablet    Sig: Take 0.5 tablets (10 mg total) by mouth every evening.    Dispense:  30 tablet    Refill:  0     Current Outpatient  Medications:  .  acetaminophen (TYLENOL) 500 MG tablet, Take 1,000 mg by mouth every 6 (six) hours as needed for moderate pain. , Disp: , Rfl:  .  aspirin EC 81 MG tablet, Take 81 mg by mouth daily. Swallow whole., Disp: , Rfl:  .  Ensure (ENSURE), Take 1 Can by mouth See admin instructions. Drink 1 can of VANILLA ENSURE by mouth once a day, Disp: , Rfl:  .  famotidine (PEPCID) 20 MG tablet, Take 1 tablet (20 mg total) by mouth 2 (two) times daily., Disp: , Rfl:  .  metoprolol tartrate (LOPRESSOR) 25 MG tablet, Take 0.5 tablets (12.5 mg total) by mouth daily. (Patient taking differently: Take 12.5 mg by mouth in the morning.), Disp: , Rfl:  .  rosuvastatin (CRESTOR) 40 MG tablet, Take 1 tablet (40 mg total) by mouth daily. (Patient taking differently: Take 40 mg by mouth at bedtime.), Disp: , Rfl:  .  XARELTO 20 MG TABS tablet, TAKE 1 TABLET BY MOUTH IN  THE EVENING AFTER DINNER (Patient taking differently: Take 20 mg by mouth daily with supper.), Disp: 90 tablet, Rfl: 3 .  olmesartan (BENICAR) 20 MG tablet, Take 0.5 tablets (10 mg total) by mouth every  evening., Disp: 30 tablet, Rfl: 0  No orders of the defined types were placed in this encounter.  --Continue cardiac medications as reconciled in final medication list. --Return in about 2 months (around 01/22/2021) for Follow up carotid disease . Or sooner if needed. --Continue follow-up with your primary care physician regarding the management of your other chronic comorbid conditions.  Patient's questions and concerns were addressed to his satisfaction. He voices understanding of the instructions provided during this encounter.   This note was created using a voice recognition software as a result there may be grammatical errors inadvertently enclosed that do not reflect the nature of this encounter. Every attempt is made to correct such errors.  Total time spent: 33 minutes.  Tessa LernerSunit Zenora Karpel, [place]DO, Sentara Leigh HospitalFACC  Pager: [phone] Office: [phone]

## 2020-11-19 NOTE — Progress Notes (Signed)
Patient stated that Dr. Odis Hollingshead went over labs with him during visit.

## 2020-12-08 ENCOUNTER — Other Ambulatory Visit: Payer: Self-pay

## 2020-12-08 DIAGNOSIS — I5021 Acute systolic (congestive) heart failure: Secondary | ICD-10-CM

## 2020-12-08 DIAGNOSIS — I129 Hypertensive chronic kidney disease with stage 1 through stage 4 chronic kidney disease, or unspecified chronic kidney disease: Secondary | ICD-10-CM

## 2020-12-11 ENCOUNTER — Ambulatory Visit: Payer: Medicare Other | Admitting: Cardiology

## 2021-01-12 ENCOUNTER — Other Ambulatory Visit: Payer: Medicare Other

## 2021-01-13 ENCOUNTER — Other Ambulatory Visit: Payer: Self-pay

## 2021-01-13 ENCOUNTER — Ambulatory Visit: Payer: Medicare Other

## 2021-01-13 DIAGNOSIS — I6522 Occlusion and stenosis of left carotid artery: Secondary | ICD-10-CM

## 2021-01-19 ENCOUNTER — Other Ambulatory Visit: Payer: Self-pay | Admitting: Cardiology

## 2021-01-19 ENCOUNTER — Other Ambulatory Visit: Payer: Self-pay

## 2021-01-19 DIAGNOSIS — I129 Hypertensive chronic kidney disease with stage 1 through stage 4 chronic kidney disease, or unspecified chronic kidney disease: Secondary | ICD-10-CM

## 2021-01-19 DIAGNOSIS — E7800 Pure hypercholesterolemia, unspecified: Secondary | ICD-10-CM

## 2021-01-19 DIAGNOSIS — N183 Chronic kidney disease, stage 3 unspecified: Secondary | ICD-10-CM

## 2021-01-19 MED ORDER — OLMESARTAN MEDOXOMIL 5 MG PO TABS: 10.0000 mg | ORAL_TABLET | Freq: Every day | ORAL | 2 refills | Status: DC

## 2021-01-19 NOTE — Progress Notes (Signed)
Called and spoke with patient regarding his CAD results. Patient does not want to wait for his appointment to discuss results. He is concerned because he has had open heart surgery in the past and is worried. Patient  wants a call today.

## 2021-01-21 LAB — CMP14+EGFR
ALT: 9 IU/L (ref 0–44)
AST: 19 IU/L (ref 0–40)
Albumin/Globulin Ratio: 1.4 (ref 1.2–2.2)
Albumin: 3.8 g/dL (ref 3.8–4.8)
Alkaline Phosphatase: 162 IU/L — ABNORMAL HIGH (ref 44–121)
BUN/Creatinine Ratio: 10 (ref 10–24)
BUN: 12 mg/dL (ref 8–27)
Bilirubin Total: 1.6 mg/dL — ABNORMAL HIGH (ref 0.0–1.2)
CO2: 20 mmol/L (ref 20–29)
Calcium: 9.1 mg/dL (ref 8.6–10.2)
Chloride: 102 mmol/L (ref 96–106)
Creatinine, Ser: 1.17 mg/dL (ref 0.76–1.27)
Globulin, Total: 2.8 g/dL (ref 1.5–4.5)
Glucose: 96 mg/dL (ref 65–99)
Potassium: 4.6 mmol/L (ref 3.5–5.2)
Sodium: 143 mmol/L (ref 134–144)
Total Protein: 6.6 g/dL (ref 6.0–8.5)
eGFR: 68 mL/min/{1.73_m2} (ref >59)

## 2021-01-21 LAB — LIPID PANEL WITH LDL/HDL RATIO
Cholesterol, Total: 132 mg/dL (ref 100–199)
HDL: 40 mg/dL (ref >39)
LDL Chol Calc (NIH): 74 mg/dL (ref 0–99)
LDL/HDL Ratio: 1.9 ratio (ref 0.0–3.6)
Triglycerides: 93 mg/dL (ref 0–149)
VLDL Cholesterol Cal: 18 mg/dL (ref 5–40)

## 2021-01-21 LAB — LDL CHOLESTEROL, DIRECT: LDL Direct: 71 mg/dL (ref 0–99)

## 2021-01-26 ENCOUNTER — Other Ambulatory Visit: Payer: Self-pay

## 2021-01-26 ENCOUNTER — Ambulatory Visit: Payer: Medicare Other | Admitting: Cardiology

## 2021-01-26 ENCOUNTER — Encounter: Payer: Self-pay | Admitting: Cardiology

## 2021-01-26 VITALS — BP 133/79 | HR 76 | Temp 98.0°F | Resp 17 | Ht 74.0 in | Wt 202.0 lb

## 2021-01-26 DIAGNOSIS — Z7901 Long term (current) use of anticoagulants: Secondary | ICD-10-CM

## 2021-01-26 DIAGNOSIS — N183 Chronic kidney disease, stage 3 unspecified: Secondary | ICD-10-CM

## 2021-01-26 DIAGNOSIS — I2510 Atherosclerotic heart disease of native coronary artery without angina pectoris: Secondary | ICD-10-CM

## 2021-01-26 DIAGNOSIS — I447 Left bundle-branch block, unspecified: Secondary | ICD-10-CM

## 2021-01-26 DIAGNOSIS — I4819 Other persistent atrial fibrillation: Secondary | ICD-10-CM

## 2021-01-26 DIAGNOSIS — E782 Mixed hyperlipidemia: Secondary | ICD-10-CM

## 2021-01-26 DIAGNOSIS — Z8679 Personal history of other diseases of the circulatory system: Secondary | ICD-10-CM

## 2021-01-26 DIAGNOSIS — I5021 Acute systolic (congestive) heart failure: Secondary | ICD-10-CM

## 2021-01-26 DIAGNOSIS — I6523 Occlusion and stenosis of bilateral carotid arteries: Secondary | ICD-10-CM

## 2021-01-26 DIAGNOSIS — Z951 Presence of aortocoronary bypass graft: Secondary | ICD-10-CM

## 2021-01-26 DIAGNOSIS — I129 Hypertensive chronic kidney disease with stage 1 through stage 4 chronic kidney disease, or unspecified chronic kidney disease: Secondary | ICD-10-CM

## 2021-01-26 DIAGNOSIS — E7800 Pure hypercholesterolemia, unspecified: Secondary | ICD-10-CM

## 2021-01-26 DIAGNOSIS — Z9889 Other specified postprocedural states: Secondary | ICD-10-CM

## 2021-01-26 NOTE — Progress Notes (Signed)
[name] Rice Date of Birth: 1951-09-19 MRN: [id number] Primary Care Provider:Kaplan, Baldemar Friday., PA-C Former Cardiology Providers: Dr. Marthenia Rolling, APRN, FNP-C Primary Cardiologist: Rex Kras, DO, [facility] [place] [facility] (established care 01/14/2020)  Date: 01/26/21 Last Office Visit: 11/19/2020  Chief Complaint  Patient presents with  . Coronary Artery Disease  . Follow-up    2 month  . Results    Carotid duplex  . Congestive Heart Failure   HPI  [name] Rice is a 70 y.o.  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  In the past underwent workup for near syncope and was noted to have multiple episodes of NSVT and ventricular standstill reaching up to 9 seconds in duration on the monitor. Than underwent LHC and was noted to have multivessel CAD and patient was recommended to undergo bypass surgery. He underwent four-vessel bypass surgery in September 2021 with Dr. Orvan Seen and did well.  S/P CABG continued to have generalized weakness, nausea / vomiting/diarrhea and went to [facility] long hospital where he was diagnosed with acute cholecystitis status post ERCP, MRCP, and partial laparoscopic cholecystectomy. His hospitalization was prolonged due to needing IV Antibiotics and symptomatic orthostatic hypotension.   Patient underwent cardiac rehab and  then transitioned back home and has been doing well.  He presents today for follow-up.  From a cardiovascular standpoint he states that he feels better with regards to having more energy, is more active, and does not have any chest pain or shortness of breath.  In the interim he had a carotid duplex to further evaluate the progression of carotid artery stenosis which was originally diagnosed prior to his CABG.  Results of his carotid duplex were reviewed in great detail at today's office visit.  Most recent labs from March 2022 also reviewed during today's office visit and noted below for further reference.  ALLERGIES: No Known Allergies  MEDICATION LIST PRIOR TO VISIT: Current Outpatient Medications on File Prior to Visit  Medication Sig Dispense Refill  . acetaminophen (TYLENOL) 500 MG tablet Take 1,000 mg by mouth every 6 (six) hours as needed for moderate pain.     Marland Kitchen aspirin EC 81 MG tablet Take 81 mg by mouth daily. Swallow whole.    . Ensure (ENSURE) Take 1 Can by mouth See admin instructions. Drink 1 can of VANILLA ENSURE by mouth once a day    . metoprolol tartrate (LOPRESSOR) 25 MG tablet Take 0.5 tablets (12.5 mg total) by mouth daily. (Patient taking differently: Take 12.5 mg by mouth in the morning.)    . olmesartan (BENICAR) 5 MG tablet Take 2 tablets (10 mg total) by mouth daily. 60 tablet 2  . rosuvastatin (CRESTOR) 10 MG tablet Take 10 mg by mouth at bedtime.    Alveda Reasons 20 MG TABS tablet TAKE 1 TABLET BY MOUTH IN  THE EVENING AFTER DINNER (Patient taking differently: Take 20 mg by mouth daily with supper.) 90 tablet 3   No current facility-administered medications on file prior to visit.  PAST MEDICAL HISTORY: Past Medical History:  Diagnosis Date  . Atrial fibrillation ([place])   . Coronary artery disease   . Dysrhythmia   . Hypertension   . LBBB (left bundle branch block)     PAST SURGICAL HISTORY: Past Surgical History:  Procedure Laterality Date  . CARDIOVERSION  N/A 09/28/2016   Procedure: CARDIOVERSION;  Surgeon: Adrian Prows, MD;  Location: [facility]COPY;  Service: Cardiovascular;  Laterality: N/A;  . CHOLECYSTECTOMY N/A 09/18/2020   Procedure: LAPAROSCOPIC PARTIAL CHOLECYSTECTOMY;  Surgeon: Alphonsa Overall, MD;  Location: [facility];  Service: General;  Laterality: N/A;  . CLIPPING OF ATRIAL APPENDAGE Left 07/29/2020   Procedure: CLIPPING OF ATRIAL APPENDAGE;  Surgeon: Wonda Olds, MD;  Location: [place];  Service: Open Heart Surgery;  Laterality: Left;  . CORONARY ARTERY BYPASS GRAFT N/A 07/29/2020   Procedure: CORONARY ARTERY BYPASS GRAFTING (CABG) TIMES FOUR USING BILATERAL INTERNAL MAMMARIES AND LEFT RADIAL ARTERY;  Surgeon: Wonda Olds, MD;  Location: [place];  Service: Open Heart Surgery;  Laterality: N/A;  . ERCP N/A 09/17/2020   Procedure: ENDOSCOPIC RETROGRADE CHOLANGIOPANCREATOGRAPHY (ERCP);  Surgeon: Ladene Artist, MD;  Location: [facility];  Service: Endoscopy;  Laterality: N/A;  . LEFT HEART CATH AND CORONARY ANGIOGRAPHY N/A 07/25/2020   Procedure: LEFT HEART CATH AND CORONARY ANGIOGRAPHY;  Surgeon: Adrian Prows, MD;  Location: [place] [facility];  Service: Cardiovascular;  Laterality: N/A;  . MAZE N/A 07/29/2020   Procedure: MAZE;  Surgeon: Wonda Olds, MD;  Location: [place];  Service: Open Heart Surgery;  Laterality: N/A;  . NO PAST SURGERIES    . RADIAL ARTERY HARVEST Left 07/29/2020   Procedure: RADIAL ARTERY HARVEST;  Surgeon: Wonda Olds, MD;  Location: [place];  Service: Open Heart Surgery;  Laterality: Left;  . REMOVAL OF STONES  09/17/2020   Procedure: REMOVAL OF STONES;  Surgeon: Ladene Artist, MD;  Location: [facility];  Service: Endoscopy;;  . Joan Mayans  09/17/2020   Procedure: SPHINCTEROTOMY;  Surgeon: Ladene Artist, MD;  Location: [facility];  Service: Endoscopy;;  . TEE WITHOUT CARDIOVERSION N/A 07/29/2020   Procedure: TRANSESOPHAGEAL ECHOCARDIOGRAM (TEE);  Surgeon: Wonda Olds, MD;  Location: [place];  Service: Open Heart Surgery;  Laterality: N/A;    FAMILY HISTORY: The patient's family history includes Hypertension in an other family member.   SOCIAL HISTORY:  The patient  reports that he has never smoked. He has never used smokeless tobacco. He reports that he does not drink alcohol and does not use drugs.  Review of Systems  Constitutional: Negative for chills and fever.  HENT: Negative for hoarse voice and nosebleeds.   Eyes: Negative for discharge, double vision and pain.  Cardiovascular: Negative for chest pain, claudication, dyspnea on exertion, leg swelling, near-syncope, orthopnea, palpitations, paroxysmal nocturnal dyspnea and syncope.  Respiratory: Negative for cough, hemoptysis and shortness of breath.   Musculoskeletal: Negative for muscle cramps and myalgias.  Gastrointestinal: Negative for abdominal pain, constipation, diarrhea, hematemesis, hematochezia, melena, nausea and vomiting.  Neurological: Negative for dizziness and light-headedness.    PHYSICAL EXAM: Vitals with BMI 01/26/2021 11/19/2020 10/22/2020  Height $Remov'6\' 2"'rwhaQe$  $Remove'6\' 2"'tLWcXuZ$  -  Weight 202 lbs 213 lbs 13 oz -  BMI 86.57 84.69 -  Systolic 629 528 413  Diastolic 79 86 85  Pulse 76 79 75   CONSTITUTIONAL: Appears older than stated age, hemodynamically stable, no acute distress.   SKIN: Skin is warm and dry. No rash noted. No cyanosis. No pallor.  No jaundice HEAD: Normocephalic and atraumatic.  EYES: No scleral icterus MOUTH/THROAT: Moist oral membranes.  NECK: No JVD present. No thyromegaly noted.  Mild bilateral carotid bruits  LYMPHATIC: No visible cervical adenopathy.  CHEST Normal respiratory effort. No intercostal retractions.  Sternotomy site is healing well. LUNGS: Clear to auscultation in the upper lung fields with decreased breath sounds at the bases, No stridor. No wheezes. No rales.  CARDIOVASCULAR: Irregularly irregular, positive S1-S2, no murmurs rubs or gallops appreciated. ABDOMINAL: No  apparent ascites.  EXTREMITIES: trace peripheral edema.  Left radial site is healing well. HEMATOLOGIC: No significant bruising. NEUROLOGIC: Oriented to person, place, and time. Nonfocal. Normal muscle tone.  PSYCHIATRIC: Normal mood and affect. Normal behavior. Cooperative  RADIOLOGY: 08/11/2020 CXR:  Persistent left pleural effusion with left base atelectasis. Scattered calcified granulomas noted. Lungs elsewhere clear. Stable cardiac enlargement with postoperative changes. Aortic Atherosclerosis  CARDIAC DATABASE: EKG: 10/22/2020: Atrial fibrillation, 73 bpm, left bundle branch block, without underlying injury pattern.  Coronary artery bypass grafting: 07/29/2020 (by Dr. [facility]van [facility]en at [facility]):  Left Internal Mammary Artery to Distal Left Anterior Descending Coronary Artery; pedicled RIMA Graft to Posterior Descending Coronary Artery; left radial artery  Graft to Obtuse Marginal Branch of Left Circumflex Coronary Artery and ramus intermedius as a sequenced graft. Bi-atrial Maze procedure and left atrial appendage clipping  Echocardiogram: 12/31/2019: Mildly depressed LV systolic function with visual EF 45-50%. Left ventricle cavity is normal in size. Left ventricle regional wall motion findings: Mid anteroseptal, Mid inferoseptal, Apical anterior, Apical septal and Apical cap hypokinesis. Mild left ventricular hypertrophy.  Unable to evaluate diastolic function due to atrial fibrillation.  Left atrial cavity is severely dilated. Right atrial cavity is slightly dilated. Right ventricle cavity is normal in size. Low normal right ventricular function. Mild aortic valve leaflet thickening with mild calcification. Mildly restricted aortic valve leaflets. No evidence of aortic valve stenosis. No aortic valve regurgitation. Mild tricuspid regurgitation. Mild pulmonary hypertension. RVSP measures 35 mmHg. IVC is dilated with a respiratory response of >50%. Prior study 08/2016: Moderate  concentric hypertrophy of the left ventricle. Normal global wall motion. Visual EF is 55-60%. Indeterminate diastolic filling pattern, indeterminate LAP due to A. Fib. Biatrial enlargement. Mild (Grade I) mitral regurgitation. Mild tricuspid regurgitation. No evidence of pulmonary hypertension.  09/27/2020: 1. Left ventricular ejection fraction, by estimation, is 45 to 50%,  although evaluation limited due to poor visualization of the endocardium.  The left ventricle has mildly decreased function. The left ventricle  demonstrates regional wall motion  abnormalities (see scoring diagram/findings for description). Left  ventricular diastolic parameters are indeterminate.  2. Right ventricular systolic function is normal. The right ventricular  size is normal.  3. Left atrial size was mildly dilated.  4. Right atrial size was mildly dilated.  5. Normal right atrial pressure.  6. Compared to previous study on 07/24/2020, post-op changes are new.   Stress Testing:  02/18/2020: No previous exam available for comparison. Lexiscan nuclear stress test performed using 1-day protocol. Stress EKG is non-diagnostic, as this is pharmacological stress test. In addition, rest and stress EKG showed atrial fibrillation with slow ventricular response, anterolateral T wave inversion. Stress LVEF 77%. Normal myocardial perfusion. Low risk study.  Heart Catheterization: 07/25/20: RCA: Proximal RCA 80% stenosis, large vessel with mild disease in PL and PDA branches. A secondary PL branch is subtotally occluded. LM: Distal 30-40% stenosis. LAD: LAD diffusely diseased, proximal diffuse 80% followed by tandem 90% stenosis. Large D1 with moderate diffuse disease and proximal  and mid tandem 70 and 80% stenosis. Has secondary branches and is tortuous. Cx and RI: Co-dominant Cx with Ostial circumflex 99% involving a moderate sized ramus with ostial 80 to 90% and proximal 90% stenosis. LV: Normal LVEDP. No  pressure gradient across the aortic valve. Patent LIMA and RIMA and RIMA has ostial 20-30% stenosis. Subclavian arteries widely patent.  Right radial diffusely disease by [place]. 51m contrast used.   Carotid duplex: 07/27/2020:  Right Carotid: Velocities in the right ICA are consistent with a 1-39% stenosis.  Left Carotid: Velocities in the left ICA are consistent with a 60-79% stenosis.  Vertebrals: Bilateral vertebral arteries demonstrate antegrade flow.   Carotid artery duplex 01/13/2021: Stenosis in the right internal carotid artery (50-69%), upper end of spectrum with heterogeneous plaque. Right distal common carotid artery shows > 50% stenosis by plaque burden (homogeneous). Stenosis in the right external carotid artery (<50%). Stenosis in the left internal carotid artery (50-69%), upper end of spectrum with heterogeneous plaque. Stenosis in the left external carotid artery (<50%). The PSV internal/common carotid artery ratio of 4.57 on the right and 6.22 on the left is consistent with a stenosis of >70% bilaterally.  Antegrade right vertebral artery flow. Antegrade left vertebral artery flow. Compared to 07/27/2020 report, there is progression of disease on the right from <40% and no significant change in left ICA stenosis. Follow up in six months is appropriate if clinically indicated.  14 day extended Holter monitor: Dominant rhythm atrial fibrillation (HR between 25-137bpm, avg. 59bpm).  Overall HR 25-197 bpm. Avg HR 59 bpm. 1519 pauses that were 3 secs or longer.  The longest pause 9.5 sec at 12:36 AM (07/04/2020), followed by 9.4 sec pause at 4:30 AM (07/11/2020), third longest pause 8.9 sec (07/08/2020). These are auto-triggered events. 20 episodes of NSVT reported. Longest episode 20 beats in duration for 10.5 seconds at an average rate 116 bpm. The fastest episode was 6 beats in duration at average rate of 170 bpm.  Total ventricular ectopic burden <1%. Patient activated events:  0.  LABORATORY DATA: CBC Latest Ref Rng & Units 11/13/2020 10/01/2020 09/30/2020  WBC 4.0 - 10.5 K/uL - 9.0 8.7  Hemoglobin 13.0 - 17.7 g/dL 13.4 10.6(L) 10.5(L)  Hematocrit 37.5 - 51.0 % 41.6 34.0(L) 35.2(L)  Platelets 150 - 400 K/uL - 303 294    CMP Latest Ref Rng & Units 01/20/2021 11/13/2020 10/20/2020  Glucose 65 - 99 mg/dL 96 108(H) 102(H)  BUN 8 - 27 mg/dL _0 Creatinine 0.76 - 1.27 mg/dL 1.17 1.04 0.90  Sodium 134 - 144 mmol/L 143 138 142  Potassium 3.5 - 5.2 mmol/L 4.6 4.8 4.5  Chloride 96 - 106 mmol/L 102 100 103  CO2 20 - 29 mmol/L _1 Calcium 8.6 - 10.2 mg/dL 9.1 8.7 9.1  Total Protein 6.0 - 8.5 g/dL 6.6 6.3 -  Total Bilirubin 0.0 - 1.2 mg/dL 1.6(H) 1.6(H) -  Alkaline Phos 44 - 121 IU/L 162(H) 134(H) -  AST 0 - 40 IU/L 19 26 -  ALT 0 - 44 IU/L 9 14 -    Lipid Panel     Component Value Date/Time   CHOL 132 01/20/2021 0942   TRIG 93 01/20/2021 0942   HDL 40 01/20/2021 0942   CHOLHDL 3.3 07/26/2020 0515   VLDL 18 07/26/2020 0515   LDLCALC 74 01/20/2021 0942   LDLDIRECT 71 01/20/2021 0943   LABVLDL 18 01/20/2021 0942    Lab Results  Component Value Date  HGBA1C 5.2 07/26/2020   No components found for: NTPROBNP Lab Results  Component Value Date   TSH 1.610 11/13/2020   TSH 1.083 09/16/2020   TSH 0.941 07/24/2020    Cardiac Panel (last 3 results) No results for input(s): CKTOTAL, CKMB, TROPONINIHS, RELINDX in the last 72 hours.  IMPRESSION:    ICD-10-CM   1. Bilateral carotid artery stenosis  I65.23 PCV CAROTID DUPLEX (BILATERAL)  2. Atherosclerosis of native coronary artery of native heart without angina pectoris  I25.10   3. Hx of CABG  Z95.1   4. Acute HFrEF (heart failure with reduced ejection fraction) (HCC)  I50.21   5. Pure hypercholesterolemia  E78.00   6. Benign hypertension with CKD (chronic kidney disease) stage III (HCC)  I12.9    N18.30   7. S/P left atrial appendage ligation  Z98.890   8. Long term current use of  anticoagulant  Z79.01   9. S/P Maze operation for atrial fibrillation  Z98.890    Z86.79   10. Left bundle branch block  I44.7   11. Persistent atrial fibrillation (HCC)  I48.19   12. Mixed hyperlipidemia  E78.2      RECOMMENDATIONS: [name] Rice is a 70 y.o. 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  Bilateral carotid artery stenosis:  Based on the most recent carotid duplex the peak systolic velocities suggest 50-69% stenosis bilaterally.  However based on ICA to CCA ratio stenosis it may be greater than 70%.  This concept was discussed with the patient in great detail at today's office visit.  Going forward recommended either checking a CTA head and neck to further define carotid artery stenosis or to refer the patient to vascular surgery for longitudinal follow-up.  However, currently patient wants to continue medical therapy and reevaluate the stenosis in 6 months.   We discussed symptoms and suggested vision changes or focal neurological deficits that may surface if he were to have a stroke.  If such symptoms are present he is asked to go to the hospital via EMS for further evaluation.  He is noted to have plaque in bilateral carotid arteries in addition he has coronary artery disease with recent CABG.  Most recent lipid profile reviewed and his LDL is around 70 mg/dL.  However I would like to be aggressive with lipid management.  But the rate limiting factor is  alkaline phosphatase remains elevated and so does the total bilirubin levels.  I am concerned that further up titration of statins, or addition of Zetia, or considering PCSK9 inhibitors may worsen liver function test.   Given his history I have asked him to GI physician to see if it safe to good up on statins given his ALK phos and T.bili levels.   Chronic HFrEF, stage C, NYHA class II:   Titration of GDMT has been difficult given his history of hypotension.  Currently on Toprol-XL and olmesartan.  I have asked him to keep a log of his blood pressures and if his home blood pressures are now greater than 120 mmHg would recommend transition to [facility].    Continue to monitor symptoms.  Multivessel  CAD s/p four-vessel bypass without angina pectoris:  Medications reconciled.  No recent chest pain or anginal equivalent.  No use of sublingual nitroglycerin tablets.  Continue aspirin and statin therapy.  Will uptitrate guideline directed medical therapy slowly given his hemodynamics.  Independently reviewed labs from 01/20/2021  Persistent atrial fibrillation, status post biatrial maze procedure:  Rate control: Beta-blocker therapy.  Rhythm control: NA (patient was on amiodarone in the past).  Thromboembolic prophylaxis: Xarelto.  Status post left atrial appendage clipping.  History of failed cardioversion in the past.  Continue current medical management.  Long-term oral anticoagulation:   CHA2DS2-VASc SCORE is 4 which correlates to 4.0 % risk of stroke per year (LVEF45-50%, HTN, Vascular disease, Age).  Reemphasized the risks, benefits, and alternatives to oral anticoagulation.  Recent labs 11/13/2020 noted improvement in Hb from 10.6 to 13.4g/dL.   Benign essential hypertension: Improving.  Office blood pressure is improving.  Continue current medical therapy.  Educated on the importance of a low-salt diet.  If his blood pressure remains stable will consider  transitioning from olmesartan to Entresto at the next visit.  FINAL MEDICATION LIST END OF ENCOUNTER: No orders of the defined types were placed in this encounter.    Current Outpatient Medications:  .  acetaminophen (TYLENOL) 500 MG tablet, Take 1,000 mg by mouth every 6 (six) hours as needed for moderate pain. , Disp: , Rfl:  .  aspirin EC 81 MG tablet, Take 81 mg by mouth daily. Swallow whole., Disp: , Rfl:  .  Ensure (ENSURE), Take 1 Can by mouth See admin instructions. Drink 1 can of VANILLA ENSURE by mouth once a day, Disp: , Rfl:  .  metoprolol tartrate (LOPRESSOR) 25 MG tablet, Take 0.5 tablets (12.5 mg total) by mouth daily. (Patient taking differently: Take 12.5 mg by mouth in the morning.), Disp: , Rfl:  .  olmesartan (BENICAR) 5 MG tablet, Take 2 tablets (10 mg total) by mouth daily., Disp: 60 tablet, Rfl: 2 .  rosuvastatin (CRESTOR) 10 MG tablet, Take 10 mg by mouth at bedtime., Disp: , Rfl:  .  XARELTO 20 MG TABS tablet, TAKE 1 TABLET BY MOUTH IN  THE EVENING AFTER DINNER (Patient taking differently: Take 20 mg by mouth daily with supper.), Disp: 90 tablet, Rfl: 3  Orders Placed This Encounter  Procedures  . PCV CAROTID DUPLEX (BILATERAL)   --Continue cardiac medications as reconciled in final medication list. --Return in about 3 months (around 04/28/2021) for Follow up , heart failure management, carotid artery disease.. Or sooner if needed. --Continue follow-up with your primary care physician regarding the management of your other chronic comorbid conditions.  Patient's questions and concerns were addressed to his satisfaction. He voices understanding of the instructions provided during this encounter.   This note was created using a voice recognition software as a result there may be grammatical errors inadvertently enclosed that do not reflect the nature of this encounter. Every attempt is made to correct such errors.  Total time spent: 40 minutes.  Rex Kras, [place],  [facility]  Pager: [phone] Office: [phone]

## 2021-01-26 NOTE — Patient Instructions (Signed)
Obtain a GI consult/referral from your primary care provider to be evaluated for elevated alkaline phosphatase and elevated T bilirubin given your GI history.  Would like to uptitrate statin therapy if possible year history of CAD status post CABG and bilateral carotid artery stenosis.

## 2021-03-04 ENCOUNTER — Encounter: Payer: Self-pay | Admitting: Gastroenterology

## 2021-03-04 ENCOUNTER — Other Ambulatory Visit (INDEPENDENT_AMBULATORY_CARE_PROVIDER_SITE_OTHER): Payer: Medicare Other

## 2021-03-04 ENCOUNTER — Ambulatory Visit: Payer: Medicare Other | Admitting: Gastroenterology

## 2021-03-04 VITALS — BP 90/60 | HR 82 | Ht 71.65 in | Wt 204.1 lb

## 2021-03-04 DIAGNOSIS — R17 Unspecified jaundice: Secondary | ICD-10-CM

## 2021-03-04 DIAGNOSIS — R748 Abnormal levels of other serum enzymes: Secondary | ICD-10-CM

## 2021-03-04 DIAGNOSIS — R197 Diarrhea, unspecified: Secondary | ICD-10-CM

## 2021-03-04 LAB — HEPATIC FUNCTION PANEL
ALT: 10 U/L (ref 0–53)
AST: 19 U/L (ref 0–37)
Albumin: 3.8 g/dL (ref 3.5–5.2)
Alkaline Phosphatase: 102 U/L (ref 39–117)
Bilirubin, Direct: 0.2 mg/dL (ref 0.0–0.3)
Total Bilirubin: 1 mg/dL (ref 0.2–1.2)
Total Protein: 7.1 g/dL (ref 6.0–8.3)

## 2021-03-04 LAB — BILIRUBIN, TOTAL: Total Bilirubin: 1 mg/dL (ref 0.2–1.2)

## 2021-03-04 LAB — GAMMA GT: GGT: 18 U/L (ref 7–51)

## 2021-03-04 NOTE — Patient Instructions (Addendum)
It was a pleasure to meet you today. Based on our discussion, I am providing you with my recommendations below:  RECOMMENDATION(S):   REFERRAL:  . A referral, your demographics, a copy of your insurance card and your records will be sent to [facility]. You will receive a call from their office regarding the date, time and location of your appointment.  LABS:   . Please proceed to the basement level for lab work before leaving today. Press "B" on the elevator. The lab is located at the first door on the left as you exit the elevator.  HEALTHCARE LAWS AND MY CHART RESULTS:   . Due to recent changes in healthcare laws, you may see results of your imaging and/or laboratory studies on MyChart before I have had a chance to review them.  I understand that in some cases there may be results that are confusing or concerning to you. Please understand that not all results are received at same time and often I may need to interpret multiple results in order to provide you with the best plan of care or course of treatment. Therefore, I ask that you please give me 48 hours to thoroughly review all your results before contacting my office for clarification.   FOLLOW UP:  . I would like for you to follow up with me in PRN. Please call the office at [phone] to schedule your appointment.  BMI:  . If you are age 55 or older, your body mass index should be between 23-30. Your There is no height or weight on file to calculate BMI. If this is out of the aforementioned range listed, please consider follow up with your Primary Care Provider.  Thank you for trusting me with your gastrointestinal care!    Tressia Danas, MD, MPH

## 2021-03-04 NOTE — Progress Notes (Addendum)
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 May be able to consider titration of statins or initiating of new therapies with close monitoring.  Addendum: Labs today show AST 19, ALT 10, alk phos 102, TB 1.0. Labs are now normal. Cardiology may trial any medications with close monitoring.   Low threshold to repeat contrasted cross-sectional imaging with onset of new symptoms or abnormal liver enzymes.       PLAN: - Hepatic function panel including total bilirubin, GGT, alk phos isoenzymes - Referral to [facility] to establish care - he asked for a primary care doctor in our system to facilitate communication between doctors - Consider colonoscopy for colon cancer screening when his health stabilizes  Please see the "Patient Instructions" section for addition details about the plan.  HPI: [name] Rice is a 70 y.o. male seen in hospital follow-up. 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 His liver enzymes were normal prior to CABG. But, when admitted in 11/21 with acute cholangitis and cholecystitis he elevated liver enzymes with a T bili 1.6, alk phos 796, AST 105, ALT 78.  He had a partial cholecystectomy 09/18/20. Liver enzymes have been slowly improving.  On his most recent office visit with his cardiologist, Dr. Terri Skains, aggressive treatment for bilateral carotid artery plaque in addition to CAD was discussed. He expressed concerns about aggressive lipid management due to his elevated alk phos and total bilirubin, noting particular concerns that further up titration of statins, or addition of Zetia, or  considering PCSK9 inhibitors may worsen liver function test.   [name] was having diarrhea until January. Bowel habits have more recently returned to baseline with one formed bowel movement daily. There is no blood or mucous.  Feels like he continues to have some memory trouble following his surgery.  He has no ongoing GI symptoms.   Liver enzymes have been overall improving since his hospitalization. Labs 01/20/21 show TB 1.6, alk phos 1.6, AST 19, ALT 9.   I've reviewed his last abdominal imaging 09/23/20: surgical drain present in the operative bed in the location of the cholecystectomy and otherwise normal bowel  12 system review of systems + for allergies, vision changes, fatigue, heart rhythm, shortness of breath  Past Medical History:  Diagnosis Date  . Atrial fibrillation ([place])   . Carotid artery stenosis   . Coronary artery disease   . Dysrhythmia   . Hypertension   . LBBB (left bundle branch block)     Past Surgical History:  Procedure Laterality Date  . CARDIOVERSION N/A 09/28/2016   Procedure: CARDIOVERSION;  Surgeon: Adrian Prows, MD;  Location: [facility];  Service: Cardiovascular;  Laterality: N/A;  . CATARACT EXTRACTION, BILATERAL    . CHOLECYSTECTOMY N/A 09/18/2020   Procedure: LAPAROSCOPIC PARTIAL CHOLECYSTECTOMY;  Surgeon: Alphonsa Overall, MD;  Location: [facility];  Service: General;  Laterality: N/A;  . CLIPPING OF ATRIAL APPENDAGE Left 07/29/2020   Procedure: CLIPPING OF ATRIAL APPENDAGE;  Surgeon: Wonda Olds, MD;  Location: [facility];  Service: Open Heart Surgery;  Laterality: Left;  . CORONARY ARTERY BYPASS GRAFT N/A 07/29/2020   Procedure: CORONARY ARTERY BYPASS GRAFTING (  CABG) TIMES FOUR USING BILATERAL INTERNAL MAMMARIES AND LEFT RADIAL ARTERY;  Surgeon: Wonda Olds, MD;  Location: [facility];  Service: Open Heart Surgery;  Laterality: N/A;  . ERCP N/A 09/17/2020   Procedure: ENDOSCOPIC RETROGRADE CHOLANGIOPANCREATOGRAPHY (ERCP);  Surgeon: Ladene Artist,  MD;  Location: [facility];  Service: Endoscopy;  Laterality: N/A;  . LEFT HEART CATH AND CORONARY ANGIOGRAPHY N/A 07/25/2020   Procedure: LEFT HEART CATH AND CORONARY ANGIOGRAPHY;  Surgeon: Adrian Prows, MD;  Location: [facility];  Service: Cardiovascular;  Laterality: N/A;  . MAZE N/A 07/29/2020   Procedure: MAZE;  Surgeon: Wonda Olds, MD;  Location: [facility];  Service: Open Heart Surgery;  Laterality: N/A;  . RADIAL ARTERY HARVEST Left 07/29/2020   Procedure: RADIAL ARTERY HARVEST;  Surgeon: Wonda Olds, MD;  Location: [facility];  Service: Open Heart Surgery;  Laterality: Left;  . REMOVAL OF STONES  09/17/2020   Procedure: REMOVAL OF STONES;  Surgeon: Ladene Artist, MD;  Location: [facility];  Service: Endoscopy;;  . Joan Mayans  09/17/2020   Procedure: SPHINCTEROTOMY;  Surgeon: Ladene Artist, MD;  Location: [facility];  Service: Endoscopy;;  . TEE WITHOUT CARDIOVERSION N/A 07/29/2020   Procedure: TRANSESOPHAGEAL ECHOCARDIOGRAM (TEE);  Surgeon: Wonda Olds, MD;  Location: [facility];  Service: Open Heart Surgery;  Laterality: N/A;    Current Outpatient Medications  Medication Sig Dispense Refill  . acetaminophen (TYLENOL) 500 MG tablet Take 1,000 mg by mouth every 6 (six) hours as needed for moderate pain.     Marland Kitchen aspirin EC 81 MG tablet Take 81 mg by mouth daily. Swallow whole.    . Ensure (ENSURE) Take 1 Can by mouth See admin instructions. Drink 1 can of VANILLA ENSURE by mouth once a day    . metoprolol tartrate (LOPRESSOR) 25 MG tablet Take 0.5 tablets (12.5 mg total) by mouth daily. (Patient taking differently: Take 12.5 mg by mouth in the morning.)    . olmesartan (BENICAR) 5 MG tablet Take 2 tablets (10 mg total) by mouth daily. 60 tablet 2  . rosuvastatin (CRESTOR) 10 MG tablet Take 10 mg by mouth at bedtime.    Alveda Reasons 20 MG TABS tablet TAKE 1 TABLET BY MOUTH IN  THE EVENING AFTER DINNER (Patient taking differently: Take 20 mg by mouth daily with supper.) 90  tablet 3   No current facility-administered medications for this visit.    Allergies as of 03/04/2021  . (No Known Allergies)    Family History  Problem Relation Age of Onset  . Heart disease Mother   . Hyperlipidemia Mother   . Heart disease Father   . Hypertension Other       Physical Exam: General:   Alert,  well-nourished, pleasant and cooperative in NAD Head:  Normocephalic and atraumatic. Eyes:  Sclera clear, no icterus.   Conjunctiva pink. Ears:  Normal auditory acuity. Nose:  No deformity, discharge,  or lesions. Mouth:  No deformity or lesions.   Neck:  Supple; no masses or thyromegaly. Lungs:  Clear throughout to auscultation.   No wheezes. Heart:  Regular rate and rhythm; no murmurs. Abdomen:  Soft, nontender, nondistended, normal bowel sounds, no rebound or guarding. No hepatosplenomegaly.   Rectal:  Deferred  Msk:  Symmetrical. No boney deformities LAD: No inguinal or umbilical LAD Extremities:  No clubbing or edema. Neurologic:  Alert and  oriented x4;  grossly nonfocal Skin:  Intact without significant lesions or rashes. Psych:  Alert and cooperative.  Normal mood and affect.    Tavia Stave L. Tarri Glenn, MD, MPH 03/06/2021, 6:19 PM

## 2021-03-05 ENCOUNTER — Ambulatory Visit: Payer: Medicare Other | Admitting: Gastroenterology

## 2021-03-06 ENCOUNTER — Encounter: Payer: Self-pay | Admitting: Gastroenterology

## 2021-03-11 ENCOUNTER — Telehealth: Payer: Self-pay

## 2021-03-11 NOTE — Telephone Encounter (Signed)
error 

## 2021-04-22 ENCOUNTER — Other Ambulatory Visit: Payer: Self-pay | Admitting: Cardiology

## 2021-04-22 DIAGNOSIS — I129 Hypertensive chronic kidney disease with stage 1 through stage 4 chronic kidney disease, or unspecified chronic kidney disease: Secondary | ICD-10-CM

## 2021-04-28 ENCOUNTER — Ambulatory Visit: Payer: Medicare Other | Admitting: Cardiology

## 2021-04-28 ENCOUNTER — Encounter: Payer: Self-pay | Admitting: Cardiology

## 2021-04-28 ENCOUNTER — Other Ambulatory Visit: Payer: Self-pay

## 2021-04-28 VITALS — BP 126/73 | HR 72 | Temp 98.1°F | Resp 16 | Ht 71.0 in | Wt 210.0 lb

## 2021-04-28 DIAGNOSIS — E7800 Pure hypercholesterolemia, unspecified: Secondary | ICD-10-CM

## 2021-04-28 DIAGNOSIS — I6523 Occlusion and stenosis of bilateral carotid arteries: Secondary | ICD-10-CM

## 2021-04-28 DIAGNOSIS — Z951 Presence of aortocoronary bypass graft: Secondary | ICD-10-CM

## 2021-04-28 DIAGNOSIS — I2510 Atherosclerotic heart disease of native coronary artery without angina pectoris: Secondary | ICD-10-CM

## 2021-04-28 DIAGNOSIS — E782 Mixed hyperlipidemia: Secondary | ICD-10-CM

## 2021-04-28 DIAGNOSIS — I447 Left bundle-branch block, unspecified: Secondary | ICD-10-CM

## 2021-04-28 DIAGNOSIS — I5022 Chronic systolic (congestive) heart failure: Secondary | ICD-10-CM

## 2021-04-28 DIAGNOSIS — Z8679 Personal history of other diseases of the circulatory system: Secondary | ICD-10-CM

## 2021-04-28 DIAGNOSIS — I5021 Acute systolic (congestive) heart failure: Secondary | ICD-10-CM

## 2021-04-28 DIAGNOSIS — I4821 Permanent atrial fibrillation: Secondary | ICD-10-CM

## 2021-04-28 DIAGNOSIS — Z9889 Other specified postprocedural states: Secondary | ICD-10-CM

## 2021-04-28 DIAGNOSIS — I129 Hypertensive chronic kidney disease with stage 1 through stage 4 chronic kidney disease, or unspecified chronic kidney disease: Secondary | ICD-10-CM

## 2021-04-28 DIAGNOSIS — N183 Chronic kidney disease, stage 3 unspecified: Secondary | ICD-10-CM

## 2021-04-28 DIAGNOSIS — Z7901 Long term (current) use of anticoagulants: Secondary | ICD-10-CM

## 2021-04-28 MED ORDER — ROSUVASTATIN CALCIUM 20 MG PO TABS: 20.0000 mg | ORAL_TABLET | Freq: Every evening | ORAL | 0 refills | Status: DC

## 2021-04-28 NOTE — Progress Notes (Signed)
[name] Rice Date of Birth: November 05, 1951 MRN: [id number] Primary Care Provider:Kaplan, Isidor HoltsKristen W., PA-C Former Cardiology Providers: Dr. Rudean HittJay Ganji/Ashton Kelley, APRN, FNP-C Primary Cardiologist: Tessa LernerSunit Marveen Donlon, DO, [facility] (established care 01/14/2020)  Date: 04/29/21 Last Office Visit: 01/26/2021   Chief Complaint  Patient presents with   carotid artery disease   heart failure with reduced ejection fraction   Follow-up   HPI  [name] Rice is a 70 y.o.  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  In the past workup for near syncope illustrated him having multiple episodes of NSVT and ventricular standstill reaching up to 9 seconds in duration on the monitor. Than underwent LHC and was noted to have multivessel CAD and patient was recommended to undergo bypass surgery. He underwent four-vessel bypass surgery in September 2021 with Dr. Vickey SagesAtkins and did well.  S/P CABG continued to have generalized weakness, nausea / vomiting/diarrhea and was diagnosed with acute cholecystitis status post ERCP, MRCP, and partial laparoscopic cholecystectomy.   During the routine pre-CABG workup he had carotid duplex which noted carotid stenosis and had a follow up study in March 2022 which noted bilateral carotid stenosis (left > right). Recommended CTA head and neck at the  last visit but he wanted to hold off on it.   Now presents for 3 month follow-up. Denies blurred vision, lightheadedness, dizziness, no symptoms to suggest progression of ICA disease.   Patient denies any chest pain or shortness of breath at rest or with effort related activities.  No use of sublingual nitroglycerin tablets.  No hospitalizations or urgent care visits for cardiovascular symptoms.  ALLERGIES: No Known Allergies  MEDICATION LIST PRIOR TO VISIT: Current Outpatient Medications on File Prior to Visit  Medication Sig Dispense Refill   acetaminophen (TYLENOL) 500 MG tablet Take 1,000 mg by mouth every 6 (six) hours as needed for moderate pain.      aspirin EC 81 MG tablet Take 81 mg by mouth daily. Swallow whole.     metoprolol tartrate (LOPRESSOR) 25 MG tablet Take 0.5 tablets (12.5 mg total) by mouth daily. (Patient taking differently: Take 12.5 mg by mouth in the morning.)     olmesartan (BENICAR) 5 MG tablet TAKE 2 TABLETS BY MOUTH EVERY DAY 180 tablet 0   XARELTO 20 MG TABS tablet TAKE 1 TABLET BY MOUTH IN  THE EVENING AFTER DINNER (Patient taking differently: Take 20 mg by mouth daily with supper.) 90 tablet 3   No current facility-administered medications on file prior to visit.    PAST MEDICAL HISTORY: Past Medical History:  Diagnosis Date   Atrial fibrillation (HCC)    Carotid artery stenosis    Coronary artery disease    Dysrhythmia    Hypertension    LBBB (left bundle branch block)     PAST SURGICAL HISTORY: Past Surgical History:  Procedure Laterality Date   CARDIOVERSION N/A 09/28/2016   Procedure: CARDIOVERSION;  Surgeon: Yates DecampJay Ganji, MD;  [facility];  Service: Cardiovascular;  Laterality: N/A;   CATARACT  EXTRACTION, BILATERAL     CHOLECYSTECTOMY N/A 09/18/2020   Procedure: LAPAROSCOPIC PARTIAL CHOLECYSTECTOMY;  Surgeon: Ovidio Kin, MD;  Location: [facility] ORS;  Service: General;  Laterality: N/A;   CLIPPING OF ATRIAL APPENDAGE Left 07/29/2020    Procedure: CLIPPING OF ATRIAL APPENDAGE;  Surgeon: Linden Dolin, MD;  Location: [facility] OR;  Service: Open Heart Surgery;  Laterality: Left;   CORONARY ARTERY BYPASS GRAFT N/A 07/29/2020   Procedure: CORONARY ARTERY BYPASS GRAFTING (CABG) TIMES FOUR USING BILATERAL INTERNAL MAMMARIES AND LEFT RADIAL ARTERY;  Surgeon: Linden Dolin, MD;  Location: [facility] OR;  Service: Open Heart Surgery;  Laterality: N/A;   ERCP N/A 09/17/2020   Procedure: ENDOSCOPIC RETROGRADE CHOLANGIOPANCREATOGRAPHY (ERCP);  Surgeon: Meryl Dare, MD;  Location: [facility];  Service: Endoscopy;  Laterality: N/A;   LEFT HEART CATH AND CORONARY ANGIOGRAPHY N/A 07/25/2020   Procedure: LEFT HEART CATH AND CORONARY ANGIOGRAPHY;  Surgeon: Yates Decamp, MD;  Location: [facility] INVASIVE CV LAB;  Service: Cardiovascular;  Laterality: N/A;   MAZE N/A 07/29/2020   Procedure: MAZE;  Surgeon: Linden Dolin, MD;  Location: [facility] OR;  Service: Open Heart Surgery;  Laterality: N/A;   RADIAL ARTERY HARVEST Left 07/29/2020   Procedure: RADIAL ARTERY HARVEST;  Surgeon: Linden Dolin, MD;  Location: [facility] OR;  Service: Open Heart Surgery;  Laterality: Left;   REMOVAL OF STONES  09/17/2020   Procedure: REMOVAL OF STONES;  Surgeon: Meryl Dare, MD;  Location: [facility];  Service: Endoscopy;;   SPHINCTEROTOMY  09/17/2020   Procedure: Dennison Mascot;  Surgeon: Meryl Dare, MD;  Location: [facility];  Service: Endoscopy;;   TEE WITHOUT CARDIOVERSION N/A 07/29/2020   Procedure: TRANSESOPHAGEAL ECHOCARDIOGRAM (TEE);  Surgeon: Linden Dolin, MD;  Location: [facility] OR;  Service: Open Heart Surgery;  Laterality: N/A;    FAMILY HISTORY: The patient's family history includes Heart disease in his father and mother; Hyperlipidemia in his mother; Hypertension in an other family member.   SOCIAL HISTORY:  The patient  reports that he has never smoked. He has never used smokeless tobacco. He reports that he does not drink alcohol and does not use  drugs.  Review of Systems  Constitutional: Negative for chills and fever.  HENT:  Negative for hoarse voice and nosebleeds.   Eyes:  Negative for discharge, double vision and pain.  Cardiovascular:  Negative for chest pain, claudication, dyspnea on exertion, leg swelling, near-syncope, orthopnea, palpitations, paroxysmal nocturnal dyspnea and syncope.  Respiratory:  Negative for cough, hemoptysis and shortness of breath.   Musculoskeletal:  Negative for muscle cramps and myalgias.  Gastrointestinal:  Negative for abdominal pain, constipation, diarrhea, hematemesis, hematochezia, melena, nausea and vomiting.  Neurological:  Negative for dizziness and light-headedness.   PHYSICAL EXAM: Vitals with BMI 04/28/2021 03/04/2021 01/26/2021  Height 5\' 11"  5' 11.654" 6\' 2"   Weight 210 lbs 204 lbs 2 oz 202 lbs  BMI 29.3 27.95 25.92  Systolic 126 90 133  Diastolic 73 60 79  Pulse 72 82 76   CONSTITUTIONAL: Appears older than stated age, hemodynamically stable, no acute distress.   SKIN: Skin is warm and dry. No rash noted. No cyanosis. No pallor. No jaundice HEAD: Normocephalic and atraumatic.  EYES: No scleral icterus MOUTH/THROAT: Moist oral membranes.  NECK: No JVD present. No thyromegaly noted.  Mild bilateral carotid bruits  LYMPHATIC: No visible cervical adenopathy.  CHEST Normal respiratory effort. No intercostal retractions.  Sternotomy site is healing well. LUNGS: Clear to auscultation in the upper lung fields with  decreased breath sounds at the bases, No stridor. No wheezes. No rales.  CARDIOVASCULAR: Irregularly irregular, positive S1-S2, no murmurs rubs or gallops appreciated. ABDOMINAL: No apparent ascites.  EXTREMITIES: trace peripheral edema.  Left radial site is healing well. HEMATOLOGIC: No significant bruising. NEUROLOGIC: Oriented to person, place, and time. Nonfocal. Normal muscle tone.  PSYCHIATRIC: Normal mood and affect. Normal behavior. Cooperative    RADIOLOGY: 08/11/2020 CXR:  Persistent left pleural effusion with left base atelectasis. Scattered calcified granulomas noted. Lungs elsewhere clear. Stable cardiac enlargement with postoperative changes.  Aortic Atherosclerosis  CARDIAC DATABASE: EKG: 04/28/2021: Atrial fibrillation, 56 bpm, left bundle branch block  Coronary artery bypass grafting: 07/29/2020 (by Dr. Vickey Sages at [facility]):  Left Internal Mammary Artery to Distal Left Anterior Descending Coronary Artery; pedicled RIMA Graft to Posterior Descending Coronary Artery; left radial artery  Graft to Obtuse Marginal Branch of Left Circumflex Coronary Artery and ramus intermedius as a sequenced graft. Bi-atrial Maze procedure and left atrial appendage clipping  Echocardiogram: 12/31/2019: Mildly depressed LV systolic function with visual EF 45-50%. Left ventricle cavity is normal in size. Left ventricle regional wall motion findings: Mid anteroseptal, Mid inferoseptal, Apical anterior, Apical septal and Apical cap hypokinesis. Mild left ventricular hypertrophy.  Unable to evaluate diastolic function due to atrial fibrillation.  Left atrial cavity is severely dilated. Right atrial cavity is slightly dilated. Right ventricle cavity is normal in size. Low normal right ventricular function. Mild aortic valve leaflet thickening with mild calcification. Mildly restricted aortic valve leaflets. No evidence of aortic valve stenosis. No aortic valve regurgitation. Mild tricuspid regurgitation. Mild pulmonary hypertension. RVSP measures 35 mmHg. IVC is dilated with a respiratory response of >50%. Prior study 08/2016: Moderate concentric hypertrophy of the left ventricle. Normal global wall motion. Visual EF is 55-60%. Indeterminate diastolic filling pattern, indeterminate LAP due to A. Fib. Biatrial enlargement. Mild (Grade I) mitral regurgitation. Mild tricuspid regurgitation. No evidence of pulmonary hypertension.  09/27/2020: 1. Left  ventricular ejection fraction, by estimation, is 45 to 50%,  although evaluation limited due to poor visualization of the endocardium.  The left ventricle has mildly decreased function. The left ventricle  demonstrates regional wall motion  abnormalities (see scoring diagram/findings for description). Left  ventricular diastolic parameters are indeterminate.   2. Right ventricular systolic function is normal. The right ventricular  size is normal.   3. Left atrial size was mildly dilated.   4. Right atrial size was mildly dilated.   5. Normal right atrial pressure.   6. Compared to previous study on 07/24/2020, post-op changes are new.   Stress Testing:  02/18/2020: No previous exam available for comparison. Lexiscan nuclear stress test performed using 1-day protocol. Stress EKG is non-diagnostic, as this is pharmacological stress test. In addition, rest and stress EKG showed atrial fibrillation with slow ventricular response, anterolateral T wave inversion. Stress LVEF 77%. Normal myocardial perfusion. Low risk study.  Heart Catheterization: 07/25/20:  RCA: Proximal RCA 80% stenosis, large vessel with mild disease in PL and PDA branches.  A secondary PL branch is subtotally occluded. LM: Distal 30-40% stenosis. LAD: LAD diffusely diseased, proximal diffuse 80% followed by tandem 90% stenosis.  Large D1 with moderate diffuse disease and proximal and mid tandem 70 and 80% stenosis.  Has secondary branches and is tortuous. Cx  and RI: Co-dominant Cx with Ostial circumflex 99% involving a moderate sized ramus with ostial 80 to 90% and proximal 90% stenosis. LV: Normal LVEDP.  No pressure gradient across the aortic valve. Patent LIMA and  RIMA and RIMA has ostial 20-30% stenosis. Subclavian arteries widely patent.  Right radial diffusely disease by [place]. 65mL contrast used.   Carotid duplex: 07/27/2020:  Right Carotid: Velocities in the right ICA are consistent with a 1-39% stenosis.  Left  Carotid: Velocities in the left ICA are consistent with a 60-79% stenosis.  Vertebrals: Bilateral vertebral arteries demonstrate antegrade flow.   Carotid artery duplex 01/13/2021: Stenosis in the right internal carotid artery (50-69%), upper end of spectrum with heterogeneous plaque.  Right distal common carotid artery shows > 50% stenosis by plaque burden (homogeneous).  Stenosis in the right external carotid artery (<50%). Stenosis in the left internal carotid artery (50-69%), upper end of spectrum with heterogeneous plaque.  Stenosis in the left external carotid artery (<50%). The PSV internal/common carotid artery ratio of 4.57 on the right and 6.22 on the left is consistent with a stenosis of >70% bilaterally.  Antegrade right vertebral artery flow. Antegrade left vertebral artery flow. Compared to 07/27/2020 report, there is progression of disease on the right from <40% and no significant change in left ICA stenosis. Follow up in six months is appropriate if clinically indicated.  14 day extended Holter monitor: Dominant rhythm atrial fibrillation (HR between 25-137bpm, avg. 59bpm).  Overall HR 25-197 bpm.  Avg HR 59 bpm. 1519 pauses that were 3 secs or longer.  The longest pause 9.5 sec at 12:36 AM (07/04/2020), followed by 9.4 sec pause at 4:30 AM (07/11/2020), third longest pause 8.9 sec (07/08/2020).  These are auto-triggered events. 20 episodes of NSVT reported.  Longest episode 20 beats in duration for 10.5 seconds at an average rate 116 bpm.  The fastest episode was 6 beats in duration at average rate of 170 bpm.  Total ventricular ectopic burden <1%. Patient activated events: 0.  LABORATORY DATA: CBC Latest Ref Rng & Units 11/13/2020 10/01/2020 09/30/2020  WBC 4.0 - 10.5 K/uL - 9.0 8.7  Hemoglobin 13.0 - 17.7 g/dL 16.1 10.6(L) 10.5(L)  Hematocrit 37.5 - 51.0 % 41.6 34.0(L) 35.2(L)  Platelets 150 - 400 K/uL - 303 294    CMP Latest Ref Rng & Units 03/04/2021 03/04/2021 01/20/2021   Glucose 65 - 99 mg/dL - - 96  BUN 8 - 27 mg/dL - - 12  Creatinine 0.96 - 1.27 mg/dL - - 0.45  Sodium 409 - 144 mmol/L - - 143  Potassium 3.5 - 5.2 mmol/L - - 4.6  Chloride 96 - 106 mmol/L - - 102  CO2 20 - 29 mmol/L - - 20  Calcium 8.6 - 10.2 mg/dL - - 9.1  Total Protein 6.0 - 8.3 g/dL 7.1 - 6.6  Total Bilirubin 0.2 - 1.2 mg/dL 1.0 1.0 8.1(X)  Alkaline Phos 39 - 117 U/L 102 - 162(H)  AST 0 - 37 U/L 19 - 19  ALT 0 - 53 U/L 10 - 9    Lipid Panel     Component Value Date/Time   CHOL 132 01/20/2021 0942   TRIG 93 01/20/2021 0942   HDL 40 01/20/2021 0942   CHOLHDL 3.3 07/26/2020 0515   VLDL 18 07/26/2020 0515   LDLCALC 74 01/20/2021 0942   LDLDIRECT 71 01/20/2021 0943   LABVLDL 18 01/20/2021 0942    Lab Results  Component Value Date   HGBA1C 5.2 07/26/2020   No components found for: NTPROBNP Lab Results  Component Value Date   TSH 1.610 11/13/2020   TSH 1.083 09/16/2020   TSH 0.941 07/24/2020    Cardiac Panel (last 3 results) No results  for input(s): CKTOTAL, CKMB, TROPONINIHS, RELINDX in the last 72 hours.  IMPRESSION:    ICD-10-CM   1. Bilateral carotid artery stenosis  I65.23 CT ANGIO NECK W OR WO CONTRAST    CT ANGIO HEAD W OR WO CONTRAST    Basic metabolic panel    2. Atherosclerosis of native coronary artery of native heart without angina pectoris  I25.10 rosuvastatin (CRESTOR) 20 MG tablet    3. Hx of CABG  Z95.1 rosuvastatin (CRESTOR) 20 MG tablet    4. Acute HFrEF (heart failure with reduced ejection fraction) (HCC)  I50.21 EKG 12-Lead    5. Pure hypercholesterolemia  E78.00 rosuvastatin (CRESTOR) 20 MG tablet    6. Benign hypertension with CKD (chronic kidney disease) stage III (HCC)  I12.9    N18.30     7. Permanent atrial fibrillation (HCC)  I48.21     8. S/P left atrial appendage ligation  Z98.890     9. Long term current use of anticoagulant  Z79.01     10. S/P Maze operation for atrial fibrillation  Z98.890    Z86.79     11. Left  bundle branch block  I44.7     12. Mixed hyperlipidemia  E78.2        RECOMMENDATIONS: [name] Rice is a 70 y.o. 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  Bilateral carotid artery stenosis: Based on the most recent carotid duplex the peak systolic velocities suggest 50-69% stenosis bilaterally.  However based on ICA to CCA ratio stenosis it may be greater than 70%. Patient is agreeable to proceed with CTA head and neck to evaluate for severity of internal carotid artery stenosis Check BMP prior to CTA We will increase the rosuvastatin to 20 mg p.o. nightly Lipid profile prior to the next office visit Appreciate recommendations by Dr. Tressia Danas last office note from 03/04/2021 reviewed  Chronic HFrEF, stage C, NYHA class II:  Titration of GDMT has been difficult given his history of hypotension. Currently on Toprol-XL and olmesartan. I have asked him to keep a log of his blood pressures and if his home blood pressures are now greater than 120 mmHg would recommend transition to [facility].   Continue to monitor symptoms.  Multivessel CAD s/p four-vessel bypass without angina pectoris: Medications reconciled. No recent chest pain or anginal equivalent.  No use of sublingual nitroglycerin tablets. Continue aspirin and statin therapy. Will uptitrate guideline directed medical therapy slowly given his hemodynamics.  Permanent atrial fibrillation, status post  biatrial maze procedure: Rate control: Beta-blocker therapy. Rhythm control: NA (patient was on amiodarone in the past). Thromboembolic prophylaxis: Xarelto. Status post left atrial appendage clipping.  History of failed cardioversion in the past. Continue current medical management.  Long-term oral anticoagulation:  CHA2DS2-VASc SCORE is 4 which correlates to 4.0 % risk of stroke per year (LVEF45-50%, HTN, Vascular disease, Age). Reemphasized the risks, benefits, and alternatives to oral anticoagulation.  Benign essential hypertension: Improving. Office blood pressure is improving. Continue current medical therapy. Educated on the importance of a low-salt diet. If his blood pressure remains stable will consider transitioning from olmesartan to Entresto at the next  visit.  FINAL MEDICATION LIST END OF ENCOUNTER: Meds ordered this encounter  Medications   rosuvastatin (CRESTOR) 20 MG tablet    Sig: Take 1 tablet (20 mg total) by mouth at bedtime.    Dispense:  90 tablet    Refill:  0     Current Outpatient Medications:    acetaminophen (TYLENOL) 500 MG tablet, Take 1,000 mg by mouth every 6 (six) hours as needed for moderate pain. , Disp: , Rfl:    aspirin EC 81 MG tablet, Take 81 mg by mouth daily. Swallow whole., Disp: , Rfl:    metoprolol tartrate (LOPRESSOR) 25 MG tablet, Take 0.5 tablets (12.5 mg total) by mouth daily. (Patient taking differently: Take 12.5 mg by mouth in the morning.), Disp: , Rfl:    olmesartan (BENICAR) 5 MG tablet, TAKE 2 TABLETS BY MOUTH EVERY DAY, Disp: 180 tablet, Rfl: 0   XARELTO 20 MG TABS tablet, TAKE 1 TABLET BY MOUTH IN  THE EVENING AFTER DINNER (Patient taking differently: Take 20 mg by mouth daily with supper.), Disp: 90 tablet, Rfl: 3   rosuvastatin (CRESTOR) 20 MG tablet, Take 1 tablet (20 mg total) by mouth at bedtime., Disp: 90 tablet, Rfl: 0  Orders Placed This Encounter  Procedures   CT ANGIO NECK W OR WO CONTRAST   CT ANGIO HEAD W OR WO  CONTRAST   Basic metabolic panel   EKG 12-Lead   --Continue cardiac medications as reconciled in final medication list. --Return in about 3 months (around 07/29/2021) for Follow up, CAD, carotid disease. Or sooner if needed. --Continue follow-up with your primary care physician regarding the management of your other chronic comorbid conditions.  Patient's questions and concerns were addressed to his satisfaction. He voices understanding of the instructions provided during this encounter.   This note was created using a voice recognition software as a result there may be grammatical errors inadvertently enclosed that do not reflect the nature of this encounter. Every attempt is made to correct such errors.   Tessa Lerner, [place], [facility]  Pager: [phone] Office: [phone]

## 2021-06-01 ENCOUNTER — Other Ambulatory Visit: Payer: Self-pay

## 2021-06-01 ENCOUNTER — Telehealth: Payer: Self-pay | Admitting: Cardiology

## 2021-06-01 DIAGNOSIS — I2510 Atherosclerotic heart disease of native coronary artery without angina pectoris: Secondary | ICD-10-CM

## 2021-06-01 MED ORDER — METOPROLOL TARTRATE 25 MG PO TABS: 12.5000 mg | ORAL_TABLET | Freq: Every day | ORAL | 2 refills | Status: DC

## 2021-06-01 NOTE — Telephone Encounter (Signed)
Needs to discuss medication w/CMA, call back asap [facility]

## 2021-06-03 ENCOUNTER — Other Ambulatory Visit: Payer: Self-pay

## 2021-06-03 DIAGNOSIS — I2510 Atherosclerotic heart disease of native coronary artery without angina pectoris: Secondary | ICD-10-CM

## 2021-06-03 MED ORDER — METOPROLOL TARTRATE 25 MG PO TABS: 12.5000 mg | ORAL_TABLET | Freq: Every day | ORAL | 2 refills | Status: DC

## 2021-06-22 ENCOUNTER — Other Ambulatory Visit: Payer: Self-pay

## 2021-06-22 ENCOUNTER — Inpatient Hospital Stay (HOSPITAL_COMMUNITY)
Admission: EM | Admit: 2021-06-22 | Discharge: 2021-07-08 | DRG: 163 | Disposition: A | Discharge status: Home / Self Care | Payer: Medicare Other | Attending: Family Medicine | Admitting: Family Medicine

## 2021-06-22 ENCOUNTER — Observation Stay (HOSPITAL_COMMUNITY): Payer: Medicare Other

## 2021-06-22 ENCOUNTER — Emergency Department (HOSPITAL_COMMUNITY): Payer: Medicare Other

## 2021-06-22 DIAGNOSIS — Z83438 Family history of other disorder of lipoprotein metabolism and other lipidemia: Secondary | ICD-10-CM

## 2021-06-22 DIAGNOSIS — Z8701 Personal history of pneumonia (recurrent): Secondary | ICD-10-CM

## 2021-06-22 DIAGNOSIS — I482 Chronic atrial fibrillation, unspecified: Secondary | ICD-10-CM | POA: Diagnosis present

## 2021-06-22 DIAGNOSIS — I2510 Atherosclerotic heart disease of native coronary artery without angina pectoris: Secondary | ICD-10-CM | POA: Diagnosis present

## 2021-06-22 DIAGNOSIS — J90 Pleural effusion, not elsewhere classified: Principal | ICD-10-CM | POA: Diagnosis present

## 2021-06-22 DIAGNOSIS — Z79899 Other long term (current) drug therapy: Secondary | ICD-10-CM

## 2021-06-22 DIAGNOSIS — Z7901 Long term (current) use of anticoagulants: Secondary | ICD-10-CM

## 2021-06-22 DIAGNOSIS — R0602 Shortness of breath: Secondary | ICD-10-CM

## 2021-06-22 DIAGNOSIS — E7800 Pure hypercholesterolemia, unspecified: Secondary | ICD-10-CM | POA: Diagnosis present

## 2021-06-22 DIAGNOSIS — Z419 Encounter for procedure for purposes other than remedying health state, unspecified: Secondary | ICD-10-CM

## 2021-06-22 DIAGNOSIS — I4821 Permanent atrial fibrillation: Secondary | ICD-10-CM

## 2021-06-22 DIAGNOSIS — Z09 Encounter for follow-up examination after completed treatment for conditions other than malignant neoplasm: Secondary | ICD-10-CM

## 2021-06-22 DIAGNOSIS — I129 Hypertensive chronic kidney disease with stage 1 through stage 4 chronic kidney disease, or unspecified chronic kidney disease: Secondary | ICD-10-CM | POA: Diagnosis not present

## 2021-06-22 DIAGNOSIS — Z20822 Contact with and (suspected) exposure to covid-19: Secondary | ICD-10-CM | POA: Diagnosis present

## 2021-06-22 DIAGNOSIS — J9383 Other pneumothorax: Secondary | ICD-10-CM | POA: Diagnosis present

## 2021-06-22 DIAGNOSIS — Z23 Encounter for immunization: Secondary | ICD-10-CM

## 2021-06-22 DIAGNOSIS — Z9889 Other specified postprocedural states: Secondary | ICD-10-CM

## 2021-06-22 DIAGNOSIS — Z7982 Long term (current) use of aspirin: Secondary | ICD-10-CM

## 2021-06-22 DIAGNOSIS — J9601 Acute respiratory failure with hypoxia: Secondary | ICD-10-CM | POA: Diagnosis present

## 2021-06-22 DIAGNOSIS — J939 Pneumothorax, unspecified: Secondary | ICD-10-CM

## 2021-06-22 DIAGNOSIS — E871 Hypo-osmolality and hyponatremia: Secondary | ICD-10-CM | POA: Diagnosis present

## 2021-06-22 DIAGNOSIS — Z9049 Acquired absence of other specified parts of digestive tract: Secondary | ICD-10-CM

## 2021-06-22 DIAGNOSIS — J9811 Atelectasis: Secondary | ICD-10-CM | POA: Diagnosis present

## 2021-06-22 DIAGNOSIS — J948 Other specified pleural conditions: Secondary | ICD-10-CM | POA: Diagnosis present

## 2021-06-22 DIAGNOSIS — I447 Left bundle-branch block, unspecified: Secondary | ICD-10-CM | POA: Diagnosis present

## 2021-06-22 DIAGNOSIS — Z8249 Family history of ischemic heart disease and other diseases of the circulatory system: Secondary | ICD-10-CM

## 2021-06-22 DIAGNOSIS — D631 Anemia in chronic kidney disease: Secondary | ICD-10-CM | POA: Diagnosis present

## 2021-06-22 DIAGNOSIS — Z951 Presence of aortocoronary bypass graft: Secondary | ICD-10-CM

## 2021-06-22 DIAGNOSIS — Z9689 Presence of other specified functional implants: Secondary | ICD-10-CM

## 2021-06-22 DIAGNOSIS — I4891 Unspecified atrial fibrillation: Secondary | ICD-10-CM | POA: Diagnosis present

## 2021-06-22 DIAGNOSIS — N183 Chronic kidney disease, stage 3 unspecified: Secondary | ICD-10-CM | POA: Diagnosis present

## 2021-06-22 DIAGNOSIS — I454 Nonspecific intraventricular block: Secondary | ICD-10-CM | POA: Diagnosis present

## 2021-06-22 DIAGNOSIS — I4892 Unspecified atrial flutter: Secondary | ICD-10-CM | POA: Diagnosis present

## 2021-06-22 LAB — CBC WITH DIFFERENTIAL/PLATELET
Abs Immature Granulocytes: 0.03 10*3/uL (ref 0.00–0.07)
Basophils Absolute: 0.1 10*3/uL (ref 0.0–0.1)
Basophils Relative: 1 %
Eosinophils Absolute: 0.2 10*3/uL (ref 0.0–0.5)
Eosinophils Relative: 3 %
HCT: 35.3 % — ABNORMAL LOW (ref 39.0–52.0)
Hemoglobin: 10.4 g/dL — ABNORMAL LOW (ref 13.0–17.0)
Immature Granulocytes: 0 %
Lymphocytes Relative: 10 %
Lymphs Abs: 0.7 10*3/uL (ref 0.7–4.0)
MCH: 25.3 pg — ABNORMAL LOW (ref 26.0–34.0)
MCHC: 29.5 g/dL — ABNORMAL LOW (ref 30.0–36.0)
MCV: 85.9 fL (ref 80.0–100.0)
Monocytes Absolute: 0.8 10*3/uL (ref 0.1–1.0)
Monocytes Relative: 11 %
Neutro Abs: 5.3 10*3/uL (ref 1.7–7.7)
Neutrophils Relative %: 75 %
Platelets: 404 10*3/uL — ABNORMAL HIGH (ref 150–400)
RBC: 4.11 MIL/uL — ABNORMAL LOW (ref 4.22–5.81)
RDW: 14.2 % (ref 11.5–15.5)
WBC: 7.1 10*3/uL (ref 4.0–10.5)
nRBC: 0 % (ref 0.0–0.2)

## 2021-06-22 LAB — RESP PANEL BY RT-PCR (FLU A&B, COVID) ARPGX2
Influenza A by PCR: NEGATIVE
Influenza B by PCR: NEGATIVE
SARS Coronavirus 2 by RT PCR: NEGATIVE

## 2021-06-22 LAB — TROPONIN I (HIGH SENSITIVITY)
Troponin I (High Sensitivity): 7 ng/L (ref <18)
Troponin I (High Sensitivity): 8 ng/L (ref <18)

## 2021-06-22 LAB — BASIC METABOLIC PANEL
Anion gap: 10 (ref 5–15)
BUN: 16 mg/dL (ref 8–23)
CO2: 25 mmol/L (ref 22–32)
Calcium: 8.8 mg/dL — ABNORMAL LOW (ref 8.9–10.3)
Chloride: 101 mmol/L (ref 98–111)
Creatinine, Ser: 1.06 mg/dL (ref 0.61–1.24)
GFR, Estimated: >60 mL/min (ref >60)
Glucose, Bld: 99 mg/dL (ref 70–99)
Potassium: 4.3 mmol/L (ref 3.5–5.1)
Sodium: 136 mmol/L (ref 135–145)

## 2021-06-22 LAB — BRAIN NATRIURETIC PEPTIDE: B Natriuretic Peptide: 244.4 pg/mL — ABNORMAL HIGH (ref 0.0–100.0)

## 2021-06-22 MED ORDER — ROSUVASTATIN CALCIUM 20 MG PO TABS
20.0000 mg | ORAL_TABLET | Freq: Every day | ORAL | Status: DC
  Administered 2021-06-23 – 2021-07-08 (×15): 20 mg via ORAL
  Filled 2021-06-22 (×15): qty 1

## 2021-06-22 MED ORDER — ACETAMINOPHEN 325 MG PO TABS
650.0000 mg | ORAL_TABLET | Freq: Four times a day (QID) | ORAL | Status: DC | PRN
  Administered 2021-06-24 – 2021-06-30 (×8): 650 mg via ORAL
  Filled 2021-06-22 (×8): qty 2

## 2021-06-22 MED ORDER — ACETAMINOPHEN 650 MG RE SUPP: 650.0000 mg | Freq: Four times a day (QID) | RECTAL | Status: DC | PRN

## 2021-06-22 MED ORDER — IOHEXOL 350 MG/ML SOLN
75.0000 mL | Freq: Once | INTRAVENOUS | Status: AC | PRN
  Administered 2021-06-22: 75 mL via INTRAVENOUS

## 2021-06-22 MED ORDER — SODIUM CHLORIDE 0.9% FLUSH
3.0000 mL | Freq: Two times a day (BID) | INTRAVENOUS | Status: DC
  Administered 2021-06-22 – 2021-07-07 (×24): 3 mL via INTRAVENOUS

## 2021-06-22 MED ORDER — POLYETHYLENE GLYCOL 3350 17 G PO PACK: 17.0000 g | PACK | Freq: Every day | ORAL | Status: DC | PRN

## 2021-06-22 MED ORDER — METOPROLOL TARTRATE 12.5 MG HALF TABLET
12.5000 mg | ORAL_TABLET | Freq: Every day | ORAL | Status: DC
  Administered 2021-06-23 – 2021-06-29 (×7): 12.5 mg via ORAL
  Filled 2021-06-22 (×7): qty 1

## 2021-06-22 NOTE — ED Provider Notes (Signed)
Emergency Medicine Provider Triage Evaluation Note  [name] Rice , a 70 y.o. male  was evaluated in triage.  Pt complains of sob that has been ongoing for several weeks. Also c/o congestion. Seen at urgent care and had cxr showing a large pleural effusion  Review of Systems  Positive: Congestion, sob Negative: fever  Physical Exam  BP (!) 120/104 (BP Location: Right Arm)   Pulse 81   Temp 98.2 F (36.8 C)   Resp (!) 24   SpO2 97%  Gen:   Awake, no distress   Resp:  Normal effort  MSK:   Moves extremities without difficulty  Other:  Decreased lung sounds on the left  Medical Decision Making  Medically screening exam initiated at 4:08 PM.  Appropriate orders placed.  [name] Rice was informed that the remainder of the evaluation will be completed by another provider, this initial triage assessment does not replace that evaluation, and the importance of remaining in the ED until their evaluation is complete.     [name] Meres, PA-C 06/22/21 1610    [name] Logan, DO 06/24/21 [id number]

## 2021-06-22 NOTE — H&P (Signed)
History and Physical   [name] Rice:[id number] DOB: 22-Mar-1951 DOA: 06/22/2021  PCP: [name], PA-C  Patient coming from: Home  Chief Complaint: Shortness of breath  HPI: [name] Rice is a 70 y.o. male with medical history significant of anemia, A. fib, hypertension, cholecystitis and Coley toco lithiasis status post cholecystectomy, CKD 3, CAD, Gill Bears syndrome, hyperlipidemia, left bundle branch block who presents with ongoing shortness of breath. Patient has had 3 to 4 weeks of shortness of breath and cough.  His not improved has been getting a little worse and so he went to urgent care to be evaluated today there is x-ray performed there which showed a large effusion he was sent to the ED for further evaluation.  As above he has had this ongoing shortness of breath he states it is worse with exertion and better with rest.  He has some chest tightness associated with it but no orthopnea.  He states he has had a rough year started with a CABG x5 about a year ago and some issues with recovery including cholecystitis requiring cholecystectomy.  He had been slowly improving since then and then started getting worse as above 3 to 4 weeks ago. He denies fevers, chills, abdominal pain, constipation, diarrhea, nausea, vomiting.   ED Course: Vital signs in the ED significant for respiratory rate in the teens to 20s and blood pressure in the 150s to 160s systolic.  EDP notes that he becomes to tachypneic when not on supplemental oxygen.  Lab work-up showed BMP with calcium of 8.8.  CBC with hemoglobin stable at 10.4 and platelets elevated to 404.  BNP mildly elevated to 244.  Troponin normal with repeat pending.  Respiratory panel for flu and COVID pending.  Chest x-ray showed large left pleural effusion.  CTA PE study was ordered and is pending.  Review of Systems: As per HPI otherwise all other systems reviewed and are negative.  Past Medical History:  Diagnosis Date   Atrial  fibrillation (HCC)    Carotid artery stenosis    Coronary artery disease    Dysrhythmia    Hypertension    LBBB (left bundle branch block)     Past Surgical History:  Procedure Laterality Date   CARDIOVERSION N/A 09/28/2016   Procedure: CARDIOVERSION;  Surgeon: Yates Decamp, MD;  Location: [facility] ENDOSCOPY;  Service: Cardiovascular;  Laterality: N/A;   CATARACT EXTRACTION, BILATERAL     CHOLECYSTECTOMY N/A 09/18/2020   Procedure: LAPAROSCOPIC PARTIAL CHOLECYSTECTOMY;  Surgeon: Ovidio Kin, MD;  Location: [facility] ORS;  Service: General;  Laterality: N/A;   CLIPPING OF ATRIAL APPENDAGE Left 07/29/2020   Procedure: CLIPPING OF ATRIAL APPENDAGE;  Surgeon: Linden Dolin, MD;  Location: [facility] OR;  Service: Open Heart Surgery;  Laterality: Left;   CORONARY ARTERY BYPASS GRAFT N/A 07/29/2020   Procedure: CORONARY ARTERY BYPASS GRAFTING (CABG) TIMES FOUR USING BILATERAL INTERNAL MAMMARIES AND LEFT RADIAL ARTERY;  Surgeon: Linden Dolin, MD;  Location: [facility] OR;  Service: Open Heart Surgery;  Laterality: N/A;   ERCP N/A 09/17/2020   Procedure: ENDOSCOPIC RETROGRADE CHOLANGIOPANCREATOGRAPHY (ERCP);  Surgeon: Meryl Dare, MD;  Location: [facility] ENDOSCOPY;  Service: Endoscopy;  Laterality: N/A;   LEFT HEART CATH AND CORONARY ANGIOGRAPHY N/A 07/25/2020   Procedure: LEFT HEART CATH AND CORONARY ANGIOGRAPHY;  Surgeon: Yates Decamp, MD;  Location: [facility] INVASIVE CV LAB;  Service: Cardiovascular;  Laterality: N/A;   MAZE N/A 07/29/2020   Procedure: MAZE;  Surgeon: Linden Dolin, MD;  Location[facility]  OR;  Service: Open Heart Surgery;  Laterality: N/A;   RADIAL ARTERY HARVEST Left 07/29/2020   Procedure: RADIAL ARTERY HARVEST;  Surgeon: Linden Dolin, MD;  Location: [facility] OR;  Service: Open Heart Surgery;  Laterality: Left;   REMOVAL OF STONES  09/17/2020   Procedure: REMOVAL OF STONES;  Surgeon: Meryl Dare, MD;  Location: [facility] ENDOSCOPY;  Service: Endoscopy;;   SPHINCTEROTOMY  09/17/2020   Procedure: Dennison Mascot;   Surgeon: Meryl Dare, MD;  Location: [facility] ENDOSCOPY;  Service: Endoscopy;;   TEE WITHOUT CARDIOVERSION N/A 07/29/2020   Procedure: TRANSESOPHAGEAL ECHOCARDIOGRAM (TEE);  Surgeon: Linden Dolin, MD;  Location: [facility] OR;  Service: Open Heart Surgery;  Laterality: N/A;    Social History  reports that he has never smoked. He has never used smokeless tobacco. He reports that he does not drink alcohol and does not use drugs.  No Known Allergies  Family History  Problem Relation Age of Onset   Heart disease Mother    Hyperlipidemia Mother    Heart disease Father    Hypertension Other   Reviewed on admission  Prior to Admission medications   Medication Sig Start Date End Date Taking? Authorizing Provider  acetaminophen (TYLENOL) 500 MG tablet Take 1,000 mg by mouth every 6 (six) hours as needed for moderate pain.     [provider]  aspirin EC 81 MG tablet Take 81 mg by mouth daily. Swallow whole.    [provider]  metoprolol tartrate (LOPRESSOR) 25 MG tablet Take 0.5 tablets (12.5 mg total) by mouth daily. 06/03/21 09/01/21  Tolia, Sunit, DO  olmesartan (BENICAR) 5 MG tablet TAKE 2 TABLETS BY MOUTH EVERY DAY 04/22/21   Tolia, Sunit, DO  rosuvastatin (CRESTOR) 20 MG tablet Take 1 tablet (20 mg total) by mouth at bedtime. 04/28/21 07/27/21  Tolia, Sunit, DO  XARELTO 20 MG TABS tablet TAKE 1 TABLET BY MOUTH IN  THE EVENING AFTER DINNER Patient taking differently: Take 20 mg by mouth daily with supper. 09/22/20   Tessa Lerner, DO    Physical Exam: Vitals:   06/22/21 1740 06/22/21 1745 06/22/21 1815 06/22/21 1845  BP:  (!) 151/92 (!) 147/81 (!) 156/66  Pulse:  82 80 (!) 58  Resp:  18 (!) 23 (!) 27  Temp:      SpO2:  97% 98% 98%  Weight: 95.3 kg     Height: 5\' 11"  (1.803 m)      Physical Exam Constitutional:      General: He is not in acute distress.    Appearance: Normal appearance.  HENT:     Head: Normocephalic and atraumatic.     Mouth/Throat:     Mouth:  Mucous membranes are moist.     Pharynx: Oropharynx is clear.     Comments: Poor dentition Eyes:     Extraocular Movements: Extraocular movements intact.     Pupils: Pupils are equal, round, and reactive to light.  Cardiovascular:     Rate and Rhythm: Normal rate. Rhythm irregular.     Pulses: Normal pulses.     Heart sounds: Normal heart sounds.  Pulmonary:     Effort: Pulmonary effort is normal. No respiratory distress.     Comments: Absent breath sounds left lower lung fields Abdominal:     General: Bowel sounds are normal. There is no distension.     Palpations: Abdomen is soft.     Tenderness: There is no abdominal tenderness.  Musculoskeletal:  General: No swelling or deformity.  Skin:    General: Skin is warm and dry.  Neurological:     General: No focal deficit present.     Mental Status: Mental status is at baseline.   Labs on Admission: I have personally reviewed following labs and imaging studies  CBC: Recent Labs  Lab 06/22/21 1620  WBC 7.1  NEUTROABS 5.3  HGB 10.4*  HCT 35.3*  MCV 85.9  PLT 404*    Basic Metabolic Panel: Recent Labs  Lab 06/22/21 1620  NA 136  K 4.3  CL 101  CO2 25  GLUCOSE 99  BUN 16  CREATININE 1.06  CALCIUM 8.8*    GFR: Estimated Creatinine Clearance: 78.6 mL/min (by C-G formula based on SCr of 1.06 mg/dL).  Liver Function Tests: No results for input(s): AST, ALT, ALKPHOS, BILITOT, PROT, ALBUMIN in the last 168 hours.  Urine analysis:    Component Value Date/Time   COLORURINE YELLOW 09/23/2020 2135   APPEARANCEUR HAZY (A) 09/23/2020 2135   LABSPEC 1.041 (H) 09/23/2020 2135   PHURINE 5.0 09/23/2020 2135   GLUCOSEU NEGATIVE 09/23/2020 2135   HGBUR LARGE (A) 09/23/2020 2135   BILIRUBINUR NEGATIVE 09/23/2020 2135   KETONESUR 5 (A) 09/23/2020 2135   PROTEINUR 30 (A) 09/23/2020 2135   NITRITE NEGATIVE 09/23/2020 2135   LEUKOCYTESUR TRACE (A) 09/23/2020 2135    Radiological Exams on Admission: DG Chest 2  View  Result Date: 06/22/2021 CLINICAL DATA:  Severe shortness of breath EXAM: CHEST - 2 VIEW COMPARISON:  09/28/2020 FINDINGS: Large left pleural effusion with only a small area of aerated lung remaining. Right lung is clear. No pleural effusion or pneumothorax. Heart and mediastinal contours are unremarkable. Prior CABG. No acute osseous abnormality. IMPRESSION: Large left pleural effusion with only a small area of aerated lung remaining. Electronically Signed   By: Elige Ko M.D.   On: 06/22/2021 17:12   CT Angio Chest PE W and/or Wo Contrast  Result Date: 06/22/2021 CLINICAL DATA:  3-4 weeks of shortness of breath and cough, initial encounter EXAM: CT ANGIOGRAPHY CHEST WITH CONTRAST TECHNIQUE: Multidetector CT imaging of the chest was performed using the standard protocol during bolus administration of intravenous contrast. Multiplanar CT image reconstructions and MIPs were obtained to evaluate the vascular anatomy. CONTRAST:  38mL OMNIPAQUE IOHEXOL 350 MG/ML SOLN COMPARISON:  Chest x-ray from earlier in the same day. FINDINGS: Cardiovascular: Thoracic aorta demonstrates atherosclerotic calcification without aneurysmal dilatation. No cardiac enlargement is seen. Coronary arteries demonstrate diffuse calcifications. Pulmonary artery shows a normal branching pattern. No filling defects are identified on the right. The pulmonary artery on the left is somewhat attenuated peripherally due to collapse of the left lung related to the known pleural effusion. Postsurgical changes are noted consistent with coronary bypass grafting and Maze procedure. Mediastinum/Nodes: Thoracic inlet is within normal limits. Scattered small mediastinal lymph nodes are noted. The esophagus as visualized is within normal limits. Lungs/Pleura: Right lung is well aerated without focal infiltrate or sizable effusion. A few calcified granulomas are seen. Left lung shows near complete collapse secondary to a large pleural effusion. Some  fullness is noted in the left infrahilar region although a discrete enhancing mass is not visualized. These changes are likely related to consolidation of the collapsed lung. Scattered granulomas in the left lung are noted as well. Upper Abdomen: Gallbladder has been surgically removed. Pneumobilia is seen likely related to prior intervention. No new focal abnormality in the upper abdomen is seen. Musculoskeletal: Degenerative changes of the  thoracic spine are seen. No definitive rib abnormality is noted. Review of the MIP images confirms the above findings. IMPRESSION: Large left pleural effusion with apparent underlying consolidation of the majority of the left lung. No discrete mass is seen although evaluation is difficult due to the collapsed lung. No evidence of pulmonary emboli. No other focal abnormality is noted. Aortic Atherosclerosis (ICD10-I70.0). Electronically Signed   By: Alcide CleverMark  Lukens M.D.   On: 06/22/2021 19:51    EKG: Independently reviewed.  Atrial fibrillation at 70 bpm.  Evidence of LVH with QRS widening and repolarization abnormality.  Assessment/Plan Principal Problem:   Pleural effusion, left Active Problems:   Atrial fibrillation (HCC)   Benign hypertension with CKD (chronic kidney disease) stage III   Pure hypercholesterolemia   Long term current use of anticoagulant   CAD, multiple vessel   Pleural effusion   S/P thoracentesis  Left pleural effusion > Patient presenting with 3 to 4 weeks of shortness of breath and cough worse with exertion better with rest found to have large left pleural effusion with minimal aerated lung remaining. > Certainly this will slow developing given the size of the effusion.  There was a small effusion noted last November. > He did have a thoracentesis on 09/25/2020 yielding 1.8 L of dark red fluid in the setting of being 2 months postop from his surgery, unclear if this may be a slow recurrence of similar etiology.  Cytology that time showed  only reactive mesothelial cells.  Pleural labs look otherwise at the time was not noted to be remarkable and culture was negative. > Concern for possible malignant etiology as it is unilateral and large left-sided. > EDP has ordered CT of the chest for further evaluation. > We will consult IR for image guided thoracentesis and labs. > BNP mildly elevated in the ED likely due to strain.  Normal troponin. > We may want to discuss his case with his cardiothoracic surgery team depending on findings of thoracentesis.  Patient states he was like his cardiologist's office to be updated as well. - Monitor on telemetry - Supplemental oxygen - Consult IR for thoracentesis - Labs requested:Amylase, cell count, culture, cytology, glucose, gram stain, LDH, pH, protein, albumin -  We will hold anticoagulant tonight to ensure this does not delay his procedure tomorrow - Trend labs  Anemia > Hemoglobin stable at 10.4 in the ED - Trend CBC  CAD > Status post CABG x4 last September. Trop normal. - Continue home metoprolol, olmesartan, rosuvastatin, ASA  Atrial fibrillation - Continue home metoprolol - Holding home anticoagulation in case this may delay his thoracentesis   Hypertension > Blood pressure running high in the ED in the setting of this large effusion and some discomfort. - We will continue with home olmesartan and metoprolol and monitor response to treatment of effusion.  CKD 3 > Renal function appears to be normal in the ED this may be an old diagnosis. - Trend renal function and electrolytes  Hyperlipidemia - Continue home rosuvastatin   DVT prophylaxis: On Xarelto chronically.  Holding this tonight to ensure decrease possibility of delay in thoracentesis.   Code Status:   Full  Family Communication:  None on admission.  He has updated his family.   Disposition Plan:   Patient is from:  Home  Anticipated DC to:  Home  Anticipated DC date:  1 to 4 days  Anticipated DC  barriers: None  Consults called:  Consult placed to IR for thoracentesis with labs.  Patient would like his cardiologist to be notified tomorrow and we may want to talk to his CT surgery team depending on the results of his thoracentesis however he is almost a year out from his CABG.   Admission status:  Observation, telemetry  Severity of Illness: The appropriate patient status for this patient is OBSERVATION. Observation status is judged to be reasonable and necessary in order to provide the required intensity of service to ensure the patient's safety. The patient's presenting symptoms, physical exam findings, and initial radiographic and laboratory data in the context of their medical condition is felt to place them at decreased risk for further clinical deterioration. Furthermore, it is anticipated that the patient will be medically stable for discharge from the hospital within 2 midnights of admission. The following factors support the patient status of observation.   " The patient's presenting symptoms include shortness of breath. " The physical exam findings include absent breath sounds left lower lung fields, irregular rhythm, poor dentition. " The initial radiographic and laboratory data are Lab work-up showed BMP with calcium of 8.8.  CBC with hemoglobin stable at 10.4 and platelets elevated to 404.  BNP mildly elevated to 244.  Troponin normal with repeat pending.  Respiratory panel for flu and COVID pending.  Chest x-ray showed large left pleural effusion.  CTA PE study was ordered and is pending.   Synetta Fail MD [facility]  How to contact the [facility] Attending or Consulting provider 7A - 7P or covering provider during after hours 7P -7A, for this patient?   Check the care team in [facility] and look for a) attending/consulting [facility] provider listed and b) the [facility] team listed Log into [email or web address] and use [place]'s universal password to access. If you do not have the password,  please contact the hospital operator. Locate the [facility] provider you are looking for under [facility] and page to a number that you can be directly reached. If you still have difficulty reaching the provider, please page the [facility] (Director on Call) for the Hospitalists listed on amion for assistance.  06/22/2021, 8:26 PM

## 2021-06-22 NOTE — ED Triage Notes (Signed)
Pt reports 3-4 weeks of shortness of breath and cough. Went to UC for same and was sent here for further eval/tx of large left pleural effusion.

## 2021-06-22 NOTE — ED Notes (Signed)
Pt resting no complaints at this time. 

## 2021-06-22 NOTE — ED Provider Notes (Signed)
[facility] EMERGENCY DEPARTMENT Provider Note   CSN: [id number] Arrival date & time: 06/22/21  1511     History Chief Complaint  Patient presents with   Shortness of Breath   Cough   Nasal Congestion    [name] Rice is a 70 y.o. male.  70 yo M with a chief complaints of shortness of breath.  Going on for about 3 to 4 weeks now.  Worse with exertion and better with rest.  Feels like his chest is a bit tight as well.  Went to urgent care today and had a chest x-ray that was concerning for large pleural effusion and was sent here for likely admission.  Had a gallbladder surgery about a year ago and also had open heart surgery.  Has been doing okay from that side of things.  Denies fevers or chills denies cough or congestion denies orthopnea or PND.  The history is provided by the patient.  Shortness of Breath Severity:  Severe Onset quality:  Gradual Duration:  4 weeks Timing:  Constant Progression:  Worsening Chronicity:  New Relieved by:  Nothing Worsened by:  Exertion Ineffective treatments:  None tried Associated symptoms: cough   Associated symptoms: no abdominal pain, no chest pain, no fever, no headaches, no rash and no vomiting   Cough Associated symptoms: shortness of breath   Associated symptoms: no chest pain, no chills, no eye discharge, no fever, no headaches, no myalgias and no rash       Past Medical History:  Diagnosis Date   Atrial fibrillation (HCC)    Carotid artery stenosis    Coronary artery disease    Dysrhythmia    Hypertension    LBBB (left bundle branch block)     Patient Active Problem List   Diagnosis Date Noted   Pleural effusion, left 06/22/2021   Dyspnea    Pleural effusion    S/P thoracentesis    Budding yeast in U/A detected 09/24/2020   Generalized weakness 09/24/2020   Nausea and vomiting 09/23/2020   Right sided hydronephrosis with renal and ureteral calculus obstruction 09/23/2020   Benign hypertension     Abnormal magnetic resonance cholangiopancreatography (MRCP)    Common bile duct (CBD) obstruction    Elevated LFTs    Choledocholithiasis with acute cholecystitis    AKI (acute kidney injury) (HCC) 09/15/2020   Sepsis (HCC) 09/15/2020   Abdominal pain 09/15/2020   Abnormal CT of the abdomen    Abnormal liver enzymes    Acute cholecystitis 09/14/2020   Malnutrition of moderate degree (HCC) 08/07/2020   Acute blood loss anemia 08/07/2020   CAD, multiple vessel 08/07/2020   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   Postural dizziness with presyncope 07/24/2020   Hypokalemia 07/24/2020   Diarrhea 07/24/2020   Lightheadedness 07/24/2020   Long term current use of anticoagulant 01/14/2020   Benign hypertension with CKD (chronic kidney disease) stage III 06/12/2019   Pure hypercholesterolemia 06/12/2019   Atrial fibrillation (HCC) 09/25/2016    Past Surgical History:  Procedure Laterality Date   CARDIOVERSION N/A 09/28/2016   Procedure: CARDIOVERSION;  Surgeon: Yates Decamp, MD;  Location: [facility] ENDOSCOPY;  Service: Cardiovascular;  Laterality: N/A;   CATARACT EXTRACTION, BILATERAL     CHOLECYSTECTOMY N/A 09/18/2020   Procedure: LAPAROSCOPIC PARTIAL CHOLECYSTECTOMY;  Surgeon: Ovidio Kin, MD;  Location: [facility] ORS;  Service: General;  Laterality: N/A;   CLIPPING OF ATRIAL APPENDAGE Left 07/29/2020   Procedure: CLIPPING OF ATRIAL APPENDAGE;  Surgeon: Linden Dolin, MD;  Location:  [facility] OR;  Service: Open Heart Surgery;  Laterality: Left;   CORONARY ARTERY BYPASS GRAFT N/A 07/29/2020   Procedure: CORONARY ARTERY BYPASS GRAFTING (CABG) TIMES FOUR USING BILATERAL INTERNAL MAMMARIES AND LEFT RADIAL ARTERY;  Surgeon: Linden Dolin, MD;  Location: [facility] OR;  Service: Open Heart Surgery;  Laterality: N/A;   ERCP N/A 09/17/2020   Procedure: ENDOSCOPIC RETROGRADE CHOLANGIOPANCREATOGRAPHY (ERCP);  Surgeon: Meryl Dare, MD;  Location: [facility];  Service: Endoscopy;  Laterality: N/A;   LEFT HEART CATH AND  CORONARY ANGIOGRAPHY N/A 07/25/2020   Procedure: LEFT HEART CATH AND CORONARY ANGIOGRAPHY;  Surgeon: Yates Decamp, MD;  Location: [facility] INVASIVE CV LAB;  Service: Cardiovascular;  Laterality: N/A;   MAZE N/A 07/29/2020   Procedure: MAZE;  Surgeon: Linden Dolin, MD;  Location: [facility] OR;  Service: Open Heart Surgery;  Laterality: N/A;   RADIAL ARTERY HARVEST Left 07/29/2020   Procedure: RADIAL ARTERY HARVEST;  Surgeon: Linden Dolin, MD;  Location: [facility] OR;  Service: Open Heart Surgery;  Laterality: Left;   REMOVAL OF STONES  09/17/2020   Procedure: REMOVAL OF STONES;  Surgeon: Meryl Dare, MD;  Location: [facility] ENDOSCOPY;  Service: Endoscopy;;   SPHINCTEROTOMY  09/17/2020   Procedure: Dennison Mascot;  Surgeon: Meryl Dare, MD;  Location: [facility] ENDOSCOPY;  Service: Endoscopy;;   TEE WITHOUT CARDIOVERSION N/A 07/29/2020   Procedure: TRANSESOPHAGEAL ECHOCARDIOGRAM (TEE);  Surgeon: Linden Dolin, MD;  Location: [facility] OR;  Service: Open Heart Surgery;  Laterality: N/A;       Family History  Problem Relation Age of Onset   Heart disease Mother    Hyperlipidemia Mother    Heart disease Father    Hypertension Other     Social History   Tobacco Use   Smoking status: Never   Smokeless tobacco: Never  Vaping Use   Vaping Use: Never used  Substance Use Topics   Alcohol use: No   Drug use: No    Home Medications Prior to Admission medications   Medication Sig Start Date End Date Taking? Authorizing Provider  acetaminophen (TYLENOL) 500 MG tablet Take 1,000 mg by mouth every 6 (six) hours as needed for moderate pain.     [provider]  aspirin EC 81 MG tablet Take 81 mg by mouth daily. Swallow whole.    [provider]  metoprolol tartrate (LOPRESSOR) 25 MG tablet Take 0.5 tablets (12.5 mg total) by mouth daily. 06/03/21 09/01/21  Tolia, Sunit, DO  olmesartan (BENICAR) 5 MG tablet TAKE 2 TABLETS BY MOUTH EVERY DAY 04/22/21   Tolia, Sunit, DO  rosuvastatin (CRESTOR) 20 MG  tablet Take 1 tablet (20 mg total) by mouth at bedtime. 04/28/21 07/27/21  Tolia, Sunit, DO  XARELTO 20 MG TABS tablet TAKE 1 TABLET BY MOUTH IN  THE EVENING AFTER DINNER Patient taking differently: Take 20 mg by mouth daily with supper. 09/22/20   Tolia, Sunit, DO    Allergies    Patient has no known allergies.  Review of Systems   Review of Systems  Constitutional:  Negative for chills and fever.  HENT:  Negative for congestion and facial swelling.   Eyes:  Negative for discharge and visual disturbance.  Respiratory:  Positive for cough and shortness of breath.   Cardiovascular:  Negative for chest pain and palpitations.  Gastrointestinal:  Negative for abdominal pain, diarrhea and vomiting.  Musculoskeletal:  Negative for arthralgias and myalgias.  Skin:  Negative for color change and rash.  Neurological:  Negative for tremors,  syncope and headaches.  Psychiatric/Behavioral:  Negative for confusion and dysphoric mood.    Physical Exam Updated Vital Signs BP (!) 156/66   Pulse (!) 58   Temp 98.2 F (36.8 C)   Resp (!) 27   Ht 5\' 11"  (1.803 m)   Wt 95.3 kg   SpO2 98%   BMI 29.30 kg/m   Physical Exam Vitals and nursing note reviewed.  Constitutional:      Appearance: He is well-developed.  HENT:     Head: Normocephalic and atraumatic.  Eyes:     Pupils: Pupils are equal, round, and reactive to light.  Neck:     Vascular: No JVD.  Cardiovascular:     Rate and Rhythm: Normal rate and regular rhythm.     Heart sounds: No murmur heard.   No friction rub. No gallop.  Pulmonary:     Effort: No respiratory distress.     Breath sounds: Examination of the left-upper field reveals decreased breath sounds. Examination of the left-middle field reveals decreased breath sounds. Examination of the left-lower field reveals decreased breath sounds. Decreased breath sounds present. No wheezing.     Comments: Diminished breath sounds in the left lung fields all the way from the top to  the bottom.  Dull to percussion. Abdominal:     General: There is no distension.     Tenderness: There is no abdominal tenderness. There is no guarding or rebound.  Musculoskeletal:        General: Normal range of motion.     Cervical back: Normal range of motion and neck supple.  Skin:    Coloration: Skin is not pale.     Findings: No rash.  Neurological:     Mental Status: He is alert and oriented to person, place, and time.  Psychiatric:        Behavior: Behavior normal.    ED Results / Procedures / Treatments   Labs (all labs ordered are listed, but only abnormal results are displayed) Labs Reviewed  CBC WITH DIFFERENTIAL/PLATELET - Abnormal; Notable for the following components:      Result Value   RBC 4.11 (*)    Hemoglobin 10.4 (*)    HCT 35.3 (*)    MCH 25.3 (*)    MCHC 29.5 (*)    Platelets 404 (*)    All other components within normal limits  BASIC METABOLIC PANEL - Abnormal; Notable for the following components:   Calcium 8.8 (*)    All other components within normal limits  BRAIN NATRIURETIC PEPTIDE - Abnormal; Notable for the following components:   B Natriuretic Peptide 244.4 (*)    All other components within normal limits  RESP PANEL BY RT-PCR (FLU A&B, COVID) [id number]  COMPREHENSIVE METABOLIC PANEL  CBC  LACTATE DEHYDROGENASE  PROTIME-INR  TROPONIN I (HIGH SENSITIVITY)  TROPONIN I (HIGH SENSITIVITY)    EKG EKG Interpretation  Date/Time:  Monday June 22 2021 16:10:27 EDT Ventricular Rate:  78 PR Interval:    QRS Duration: 140 QT Interval:  414 QTC Calculation: 471 R Axis:   57 Text Interpretation: Atrial fibrillation Left ventricular hypertrophy with QRS widening and repolarization abnormality ( Cornell product ) Cannot rule out Anteroseptal infarct , age undetermined Abnormal ECG No significant change since last tracing Confirmed by Melene PlanFloyd, Nathifa Ritthaler [id number]) on 06/22/2021 4:52:55 PM  Radiology DG Chest 2 View  Result Date: 06/22/2021 CLINICAL DATA:   Severe shortness of breath EXAM: CHEST - 2 VIEW COMPARISON:  09/28/2020 FINDINGS: Large left  pleural effusion with only a small area of aerated lung remaining. Right lung is clear. No pleural effusion or pneumothorax. Heart and mediastinal contours are unremarkable. Prior CABG. No acute osseous abnormality. IMPRESSION: Large left pleural effusion with only a small area of aerated lung remaining. Electronically Signed   By: Elige Ko M.D.   On: 06/22/2021 17:12    Procedures Procedures   Medications Ordered in ED Medications  metoprolol tartrate (LOPRESSOR) tablet 12.5 mg (has no administration in time range)  rosuvastatin (CRESTOR) tablet 20 mg (has no administration in time range)  sodium chloride flush (NS) 0.9 % injection 3 mL (has no administration in time range)  acetaminophen (TYLENOL) tablet 650 mg (has no administration in time range)    Or  acetaminophen (TYLENOL) suppository 650 mg (has no administration in time range)  polyethylene glycol (MIRALAX / GLYCOLAX) packet 17 g (has no administration in time range)  iohexol (OMNIPAQUE) 350 MG/ML injection 75 mL (75 mLs Intravenous Contrast Given 06/22/21 1934)    ED Course  I have reviewed the triage vital signs and the nursing notes.  Pertinent labs & imaging results that were available during my care of the patient were reviewed by me and considered in my medical decision making (see chart for details).    MDM Rules/Calculators/A&P                           70 yo M with a chief complaints of shortness of breath.  This been going on for about 3 to 4 weeks.  Worse with exertion and better with rest.  Quite tachypneic on arrival here.  Chest x-ray viewed by me more consistent with pleural effusion based on history.  Will obtain a laboratory evaluation.  Admit.  The patients results and plan were reviewed and discussed.   Any x-rays performed were independently reviewed by myself.   Differential diagnosis were considered with the  presenting HPI.  Medications  metoprolol tartrate (LOPRESSOR) tablet 12.5 mg (has no administration in time range)  rosuvastatin (CRESTOR) tablet 20 mg (has no administration in time range)  sodium chloride flush (NS) 0.9 % injection 3 mL (has no administration in time range)  acetaminophen (TYLENOL) tablet 650 mg (has no administration in time range)    Or  acetaminophen (TYLENOL) suppository 650 mg (has no administration in time range)  polyethylene glycol (MIRALAX / GLYCOLAX) packet 17 g (has no administration in time range)  iohexol (OMNIPAQUE) 350 MG/ML injection 75 mL (75 mLs Intravenous Contrast Given 06/22/21 1934)    Vitals:   06/22/21 1740 06/22/21 1745 06/22/21 1815 06/22/21 1845  BP:  (!) 151/92 (!) 147/81 (!) 156/66  Pulse:  82 80 (!) 58  Resp:  18 (!) 23 (!) 27  Temp:      SpO2:  97% 98% 98%  Weight: 95.3 kg     Height: 5\' 11"  (1.803 m)       Final diagnoses:  Pleural effusion  SOB (shortness of breath)    Admission/ observation were discussed with the admitting physician, patient and/or family and they are comfortable with the plan.   Final Clinical Impression(s) / ED Diagnoses Final diagnoses:  Pleural effusion  SOB (shortness of breath)    Rx / DC Orders ED Discharge Orders     None        , DO 06/22/21 1949

## 2021-06-23 ENCOUNTER — Observation Stay (HOSPITAL_COMMUNITY): Payer: Medicare Other

## 2021-06-23 DIAGNOSIS — I4891 Unspecified atrial fibrillation: Secondary | ICD-10-CM | POA: Diagnosis present

## 2021-06-23 DIAGNOSIS — D631 Anemia in chronic kidney disease: Secondary | ICD-10-CM | POA: Diagnosis present

## 2021-06-23 DIAGNOSIS — I4821 Permanent atrial fibrillation: Secondary | ICD-10-CM | POA: Diagnosis not present

## 2021-06-23 DIAGNOSIS — I129 Hypertensive chronic kidney disease with stage 1 through stage 4 chronic kidney disease, or unspecified chronic kidney disease: Secondary | ICD-10-CM | POA: Diagnosis present

## 2021-06-23 DIAGNOSIS — I447 Left bundle-branch block, unspecified: Secondary | ICD-10-CM | POA: Diagnosis present

## 2021-06-23 DIAGNOSIS — I2510 Atherosclerotic heart disease of native coronary artery without angina pectoris: Secondary | ICD-10-CM | POA: Diagnosis present

## 2021-06-23 DIAGNOSIS — J948 Other specified pleural conditions: Secondary | ICD-10-CM | POA: Diagnosis present

## 2021-06-23 DIAGNOSIS — R0602 Shortness of breath: Secondary | ICD-10-CM | POA: Diagnosis present

## 2021-06-23 DIAGNOSIS — N183 Chronic kidney disease, stage 3 unspecified: Secondary | ICD-10-CM | POA: Diagnosis present

## 2021-06-23 DIAGNOSIS — J9601 Acute respiratory failure with hypoxia: Secondary | ICD-10-CM | POA: Diagnosis present

## 2021-06-23 DIAGNOSIS — J90 Pleural effusion, not elsewhere classified: Secondary | ICD-10-CM | POA: Diagnosis present

## 2021-06-23 DIAGNOSIS — Z23 Encounter for immunization: Secondary | ICD-10-CM | POA: Diagnosis present

## 2021-06-23 DIAGNOSIS — Z20822 Contact with and (suspected) exposure to covid-19: Secondary | ICD-10-CM | POA: Diagnosis present

## 2021-06-23 DIAGNOSIS — Z8701 Personal history of pneumonia (recurrent): Secondary | ICD-10-CM | POA: Diagnosis not present

## 2021-06-23 DIAGNOSIS — I454 Nonspecific intraventricular block: Secondary | ICD-10-CM | POA: Diagnosis present

## 2021-06-23 DIAGNOSIS — J9811 Atelectasis: Secondary | ICD-10-CM | POA: Diagnosis present

## 2021-06-23 DIAGNOSIS — E7800 Pure hypercholesterolemia, unspecified: Secondary | ICD-10-CM | POA: Diagnosis present

## 2021-06-23 DIAGNOSIS — Z83438 Family history of other disorder of lipoprotein metabolism and other lipidemia: Secondary | ICD-10-CM | POA: Diagnosis not present

## 2021-06-23 DIAGNOSIS — Z951 Presence of aortocoronary bypass graft: Secondary | ICD-10-CM | POA: Diagnosis not present

## 2021-06-23 DIAGNOSIS — Z9049 Acquired absence of other specified parts of digestive tract: Secondary | ICD-10-CM | POA: Diagnosis not present

## 2021-06-23 DIAGNOSIS — E871 Hypo-osmolality and hyponatremia: Secondary | ICD-10-CM | POA: Diagnosis present

## 2021-06-23 DIAGNOSIS — J9819 Other pulmonary collapse: Secondary | ICD-10-CM | POA: Diagnosis not present

## 2021-06-23 DIAGNOSIS — Z7982 Long term (current) use of aspirin: Secondary | ICD-10-CM | POA: Diagnosis not present

## 2021-06-23 DIAGNOSIS — Z8249 Family history of ischemic heart disease and other diseases of the circulatory system: Secondary | ICD-10-CM | POA: Diagnosis not present

## 2021-06-23 DIAGNOSIS — I4892 Unspecified atrial flutter: Secondary | ICD-10-CM | POA: Diagnosis present

## 2021-06-23 DIAGNOSIS — J9383 Other pneumothorax: Secondary | ICD-10-CM | POA: Diagnosis present

## 2021-06-23 DIAGNOSIS — Z7901 Long term (current) use of anticoagulants: Secondary | ICD-10-CM | POA: Diagnosis not present

## 2021-06-23 HISTORY — PX: IR THORACENTESIS ASP PLEURAL SPACE W/IMG GUIDE: IMG5380

## 2021-06-23 LAB — COMPREHENSIVE METABOLIC PANEL
ALT: 8 U/L (ref 0–44)
AST: 13 U/L — ABNORMAL LOW (ref 15–41)
Albumin: 2.5 g/dL — ABNORMAL LOW (ref 3.5–5.0)
Alkaline Phosphatase: 98 U/L (ref 38–126)
Anion gap: 9 (ref 5–15)
BUN: 13 mg/dL (ref 8–23)
CO2: 26 mmol/L (ref 22–32)
Calcium: 8.8 mg/dL — ABNORMAL LOW (ref 8.9–10.3)
Chloride: 102 mmol/L (ref 98–111)
Creatinine, Ser: 1 mg/dL (ref 0.61–1.24)
GFR, Estimated: >60 mL/min (ref >60)
Glucose, Bld: 93 mg/dL (ref 70–99)
Potassium: 4.6 mmol/L (ref 3.5–5.1)
Sodium: 137 mmol/L (ref 135–145)
Total Bilirubin: 1.4 mg/dL — ABNORMAL HIGH (ref 0.3–1.2)
Total Protein: 6.6 g/dL (ref 6.5–8.1)

## 2021-06-23 LAB — PROTEIN, PLEURAL OR PERITONEAL FLUID: Total protein, fluid: 3.7 g/dL

## 2021-06-23 LAB — CBC
HCT: 33.8 % — ABNORMAL LOW (ref 39.0–52.0)
Hemoglobin: 9.9 g/dL — ABNORMAL LOW (ref 13.0–17.0)
MCH: 25.5 pg — ABNORMAL LOW (ref 26.0–34.0)
MCHC: 29.3 g/dL — ABNORMAL LOW (ref 30.0–36.0)
MCV: 87.1 fL (ref 80.0–100.0)
Platelets: 368 10*3/uL (ref 150–400)
RBC: 3.88 MIL/uL — ABNORMAL LOW (ref 4.22–5.81)
RDW: 14.2 % (ref 11.5–15.5)
WBC: 7 10*3/uL (ref 4.0–10.5)
nRBC: 0 % (ref 0.0–0.2)

## 2021-06-23 LAB — BODY FLUID CELL COUNT WITH DIFFERENTIAL
Eos, Fluid: 0 %
Lymphs, Fluid: 98 %
Monocyte-Macrophage-Serous Fluid: 2 % — ABNORMAL LOW (ref 50–90)
Neutrophil Count, Fluid: 0 % (ref 0–25)
Total Nucleated Cell Count, Fluid: 4480 cu mm — ABNORMAL HIGH (ref 0–1000)

## 2021-06-23 LAB — GLUCOSE, PLEURAL OR PERITONEAL FLUID: Glucose, Fluid: 72 mg/dL

## 2021-06-23 LAB — LACTATE DEHYDROGENASE
LDH: 149 U/L (ref 98–192)
LDH: 133 U/L (ref 98–192)

## 2021-06-23 LAB — PROTIME-INR
INR: 1.7 — ABNORMAL HIGH (ref 0.8–1.2)
Prothrombin Time: 19.7 seconds — ABNORMAL HIGH (ref 11.4–15.2)

## 2021-06-23 LAB — LACTATE DEHYDROGENASE, PLEURAL OR PERITONEAL FLUID: LD, Fluid: 178 U/L — ABNORMAL HIGH (ref 3–23)

## 2021-06-23 LAB — AMYLASE, PLEURAL OR PERITONEAL FLUID: Amylase, Fluid: 30 U/L

## 2021-06-23 LAB — ALBUMIN, PLEURAL OR PERITONEAL FLUID: Albumin, Fluid: 1.7 g/dL

## 2021-06-23 MED ORDER — LIDOCAINE HCL 1 % IJ SOLN
INTRAMUSCULAR | Status: AC
  Filled 2021-06-23: qty 20

## 2021-06-23 MED ORDER — PNEUMOCOCCAL VAC POLYVALENT 25 MCG/0.5ML IJ INJ
0.5000 mL | INJECTION | INTRAMUSCULAR | Status: AC
  Administered 2021-06-24: 0.5 mL via INTRAMUSCULAR
  Filled 2021-06-23: qty 0.5

## 2021-06-23 MED ORDER — LIDOCAINE HCL (PF) 1 % IJ SOLN
INTRAMUSCULAR | Status: AC | PRN
  Administered 2021-06-23: 10 mL

## 2021-06-23 MED ORDER — TRAZODONE HCL 50 MG PO TABS
50.0000 mg | ORAL_TABLET | Freq: Every day | ORAL | Status: DC
  Administered 2021-06-23 – 2021-07-05 (×13): 50 mg via ORAL
  Filled 2021-06-23 (×13): qty 1

## 2021-06-23 NOTE — Procedures (Signed)
PROCEDURE SUMMARY:  Successful image-guided left thoracentesis. Yielded 1.2 L of hazy amber fluid. Pt tolerated procedure well. No immediate complications. EBL = trace   Specimen was sent for labs. CXR ordered.  Please see imaging section of Epic for full dictation.  Lynann Bologna Kadden Osterhout PA-C 06/23/2021 11:07 AM

## 2021-06-23 NOTE — ED Notes (Signed)
Pt given water and crackers  

## 2021-06-23 NOTE — Plan of Care (Signed)
°  Problem: Education: °Goal: Knowledge of General Education information will improve °Description: Including pain rating scale, medication(s)/side effects and non-pharmacologic comfort measures °Outcome: Progressing °  °Problem: Health Behavior/Discharge Planning: °Goal: Ability to manage health-related needs will improve °Outcome: Progressing °  °Problem: Clinical Measurements: °Goal: Cardiovascular complication will be avoided °Outcome: Progressing °  °

## 2021-06-23 NOTE — Progress Notes (Signed)
PROGRESS NOTE    [name] Rice  ZOX:[id number] DOB: [birth year] DOA: 06/22/2021 PCP: Richmond CampbellKaplan, Kristen W., PA-C   Chief Complaint  Patient presents with   Shortness of Breath   Cough   Nasal Congestion   Brief Narrative: [name] Rice is [name] Rice 70 y.o. male with medical history significant of anemia, [name] Rice. fib, hypertension, cholecystitis and choledocholithiasis status post cholecystectomy, CKD 3, CAD s/p CABG, gilberts syndrome, hyperlipidemia, left bundle branch block who presents with ongoing shortness of breath.  He was found to have [name] Rice large L sided effusion.  Admitted for management.   Assessment & Plan:   Principal Problem:   Pleural effusion, left Active Problems:   Atrial fibrillation (HCC)   Benign hypertension with CKD (chronic kidney disease) stage III   Pure hypercholesterolemia   Long term current use of anticoagulant   CAD, multiple vessel   Pleural effusion   S/P thoracentesis  Acute Hypoxic Respiratory Failure L sided Exudative Effusion  This is [name] Rice recurrent L sided effusion, had thora 09/2020 with 1.85 L off.  Cytology 09/2020 showed reactive mesothelial cells. S/p thoracentesis by IR 8/16, 1.2 L off Follow pending culture, pH, cytology.  Follow thora labs. Given exudative by lights, consider discussion with pulmonology given recurrence of pleural effusion, this time notably an exudative effusion. CT PE with large L effusion with underlying consolidation of majority of L lung Repeat CXR in AM, consider repeat CT scan with thoracentesis complete Xarelto held for thora, consider resumption  Atrial Fibrillation Metop Xarelto currently on hold  Hypertension Continue olmesartan and metop  Anemia Follow  HLD crestor   DVT prophylaxis: SCD Code Status: full  Family Communication: none at bedside Disposition:   Status is: Observation  The patient will require care spanning > 2 midnights and should be moved to inpatient because: Inpatient level of care  appropriate due to severity of illness  Dispo: The patient is from: Home              Anticipated d/c is to:  pending              Patient currently is not medically stable to d/c.   Difficult to place patient No       Consultants:  IR  Procedures: Thoracentesis 8/16  Antimicrobials:  Anti-infectives (From admission, onward)    None          Subjective: Anxious Doesn't want to go home too early   Objective: Vitals:   06/23/21 1131 06/23/21 1158 06/23/21 1224 06/23/21 1428  BP: 134/80  (!) 176/55   Pulse: 84  78   Resp: 20  16 20   Temp:  98.8 F (37.1 C) 98.3 F (36.8 C)   TempSrc:  Oral    SpO2: 98%  100%   Weight:    90.4 kg  Height:    6\' 1"  (1.854 m)   No intake or output data in the 24 hours ending 06/23/21 2117 Filed Weights   06/22/21 1740 06/23/21 1428  Weight: 95.3 kg 90.4 kg    Examination:  General exam: Appears calm and comfortable  Respiratory system: unlabored Cardiovascular system: S1 & S2 heard, RRR. Gastrointestinal system: Abdomen is nondistended, soft and nontender.  Central nervous system: Alert and oriented. No focal neurological deficits. Extremities: no LEE Skin: No rashes, lesions or ulcers Psychiatry: Judgement and insight appear normal. Mood & affect appropriate.     Data Reviewed: I have personally reviewed following labs and imaging studies  CBC: Recent Labs  Lab 06/22/21 1620 06/23/21 0206  WBC 7.1 7.0  NEUTROABS 5.3  --   HGB 10.4* 9.9*  HCT 35.3* 33.8*  MCV 85.9 87.1  PLT 404* 368    Basic Metabolic Panel: Recent Labs  Lab 06/22/21 1620 06/23/21 0206  NA 136 137  K 4.3 4.6  CL 101 102  CO2 25 26  GLUCOSE 99 93  BUN 16 13  CREATININE 1.06 1.00  CALCIUM 8.8* 8.8*    GFR: Estimated Creatinine Clearance: 79.9 mL/min (by C-G formula based on SCr of 1 mg/dL).  Liver Function Tests: Recent Labs  Lab 06/23/21 0206  AST 13*  ALT 8  ALKPHOS 98  BILITOT 1.4*  PROT 6.6  ALBUMIN 2.5*     CBG: No results for input(s): GLUCAP in the last 168 hours.   Recent Results (from the past 240 hour(s))  Resp Panel by RT-PCR (Flu Alexei Doswell&B, Covid) Nasopharyngeal Swab     Status: None   Collection Time: 06/22/21  5:35 PM   Specimen: Nasopharyngeal Swab; Nasopharyngeal(NP) swabs in vial transport medium  Result Value Ref Range Status   SARS Coronavirus 2 by RT PCR NEGATIVE NEGATIVE Final    Comment: (NOTE) SARS-CoV-2 target nucleic acids are NOT DETECTED.  The SARS-CoV-2 RNA is generally detectable in upper respiratory specimens during the acute phase of infection. The lowest concentration of SARS-CoV-2 viral copies this assay can detect is 138 copies/mL. [name] Rice negative result does not preclude SARS-Cov-2 infection and should not be used as the sole basis for treatment or other patient management decisions. [name] Rice negative result may occur with  improper specimen collection/handling, submission of specimen other than nasopharyngeal swab, presence of viral mutation(s) within the areas targeted by this assay, and inadequate number of viral copies(<138 copies/mL). [name] Rice negative result must be combined with clinical observations, patient history, and epidemiological information. The expected result is Negative.  Fact Sheet for Patients:  BloggerCourse.com  Fact Sheet for Healthcare Providers:  SeriousBroker.it  This test is no t yet approved or cleared by the [place] FDA and  has been authorized for detection and/or diagnosis of SARS-CoV-2 by FDA under an Emergency Use Authorization (EUA). This EUA will remain  in effect (meaning this test can be used) for the duration of the COVID-19 declaration under Section 564(b)(1) of the Act, 21 U.S.C.section 360bbb-3(b)(1), unless the authorization is terminated  or revoked sooner.       Influenza Brinson Tozzi by PCR NEGATIVE NEGATIVE Final   Influenza B by PCR NEGATIVE NEGATIVE Final    Comment:  (NOTE) The Xpert Xpress SARS-CoV-2/FLU/RSV plus assay is intended as an aid in the diagnosis of influenza from Nasopharyngeal swab specimens and should not be used as [name] Rice sole basis for treatment. Nasal washings and aspirates are unacceptable for Xpert Xpress SARS-CoV-2/FLU/RSV testing.  Fact Sheet for Patients: BloggerCourse.com  Fact Sheet for Healthcare Providers: SeriousBroker.it  This test is not yet approved or cleared by the [place] FDA and has been authorized for detection and/or diagnosis of SARS-CoV-2 by FDA under an Emergency Use Authorization (EUA). This EUA will remain in effect (meaning this test can be used) for the duration of the COVID-19 declaration under Section 564(b)(1) of the Act, 21 U.S.C. section 360bbb-3(b)(1), unless the authorization is terminated or revoked.  Performed at [facility], [address] [place], [place] [address]   Body fluid culture w Gram Stain     Status: None (Preliminary result)   Collection Time: 06/23/21 11:10 AM   Specimen: Lung, Left;  Pleural Fluid  Result Value Ref Range Status   Specimen Description PLEURAL FLUID  Final   Special Requests LEFT LUNG  Final   Gram Stain   Final    MODERATE WBC PRESENT, PREDOMINANTLY MONONUCLEAR NO ORGANISMS SEEN Performed at [facility], [address], [place], [place] [address]    Culture PENDING  Incomplete   Report Status PENDING  Incomplete         Radiology Studies: DG Chest 1 View  Result Date: 06/23/2021 CLINICAL DATA:  Status post left thoracentesis. EXAM: CHEST  1 VIEW COMPARISON:  June 22, 2021. FINDINGS: Stable cardiomediastinal silhouette. No pneumothorax is noted. Large loculated left pleural effusion is mildly decreased compared to prior exam. IMPRESSION: No pneumothorax status post left-sided thoracentesis. Electronically Signed   By: Lupita Raider M.D.   On: 06/23/2021 11:30   DG Chest 2  View  Result Date: 06/22/2021 CLINICAL DATA:  Severe shortness of breath EXAM: CHEST - 2 VIEW COMPARISON:  09/28/2020 FINDINGS: Large left pleural effusion with only Jaziya Obarr small area of aerated lung remaining. Right lung is clear. No pleural effusion or pneumothorax. Heart and mediastinal contours are unremarkable. Prior CABG. No acute osseous abnormality. IMPRESSION: Large left pleural effusion with only [name] Rice small area of aerated lung remaining. Electronically Signed   By: Elige Ko M.D.   On: 06/22/2021 17:12   CT Angio Chest PE W and/or Wo Contrast  Result Date: 06/22/2021 CLINICAL DATA:  3-4 weeks of shortness of breath and cough, initial encounter EXAM: CT ANGIOGRAPHY CHEST WITH CONTRAST TECHNIQUE: Multidetector CT imaging of the chest was performed using the standard protocol during bolus administration of intravenous contrast. Multiplanar CT image reconstructions and MIPs were obtained to evaluate the vascular anatomy. CONTRAST:  59mL OMNIPAQUE IOHEXOL 350 MG/ML SOLN COMPARISON:  Chest x-ray from earlier in the same day. FINDINGS: Cardiovascular: Thoracic aorta demonstrates atherosclerotic calcification without aneurysmal dilatation. No cardiac enlargement is seen. Coronary arteries demonstrate diffuse calcifications. Pulmonary artery shows [name] Rice normal branching pattern. No filling defects are identified on the right. The pulmonary artery on the left is somewhat attenuated peripherally due to collapse of the left lung related to the known pleural effusion. Postsurgical changes are noted consistent with coronary bypass grafting and Maze procedure. Mediastinum/Nodes: Thoracic inlet is within normal limits. Scattered small mediastinal lymph nodes are noted. The esophagus as visualized is within normal limits. Lungs/Pleura: Right lung is well aerated without focal infiltrate or sizable effusion. [name] Rice few calcified granulomas are seen. Left lung shows near complete collapse secondary to [name] Rice large pleural effusion. Some  fullness is noted in the left infrahilar region although [name] Rice discrete enhancing mass is not visualized. These changes are likely related to consolidation of the collapsed lung. Scattered granulomas in the left lung are noted as well. Upper Abdomen: Gallbladder has been surgically removed. Pneumobilia is seen likely related to prior intervention. No new focal abnormality in the upper abdomen is seen. Musculoskeletal: Degenerative changes of the thoracic spine are seen. No definitive rib abnormality is noted. Review of the MIP images confirms the above findings. IMPRESSION: Large left pleural effusion with apparent underlying consolidation of the majority of the left lung. No discrete mass is seen although evaluation is difficult due to the collapsed lung. No evidence of pulmonary emboli. No other focal abnormality is noted. Aortic Atherosclerosis (ICD10-I70.0). Electronically Signed   By: Alcide Clever M.D.   On: 06/22/2021 19:51   IR THORACENTESIS ASP PLEURAL SPACE W/IMG GUIDE  Result Date: 06/23/2021 INDICATION:  Shortness of breath, pleural effusion seen on previous CXR. Request for therapeutic and diagnostic thoracentesis. EXAM: ULTRASOUND GUIDED LEFT THORACENTESIS MEDICATIONS: 10 mL 1% lidocaine COMPLICATIONS: None immediate. PROCEDURE: An ultrasound guided thoracentesis was thoroughly discussed with the patient and questions answered. The benefits, risks, alternatives and complications were also discussed. The patient understands and wishes to proceed with the procedure. Written consent was obtained. Ultrasound was performed to localize and mark an adequate pocket of fluid in the left chest. The area was then prepped and draped in the normal sterile fashion. 1% Lidocaine was used for local anesthesia. Under ultrasound guidance [name] Rice 6 Fr Safe-T-Centesis catheter was introduced. Thoracentesis was performed. The catheter was removed and [name] Rice Kentner dressing applied. FINDINGS: Evaan Tidwell total of approximately 1.2 L of hazy amber fluid  was removed. Samples were sent to the laboratory as requested by the clinical team. Post procedure chest X-ray reviewed, negative for pneumothorax. IMPRESSION: Successful ultrasound guided left thoracentesis yielding 1.2 L of pleural fluid. Read by: Lawernce Ion, PA-C Electronically Signed   By: Acquanetta Belling M.D.   On: 06/23/2021 11:48        Scheduled Meds:  lidocaine       metoprolol tartrate  12.5 mg Oral Daily   [START ON 06/24/2021] pneumococcal 23 valent vaccine  0.5 mL Intramuscular Tomorrow-1000   rosuvastatin  20 mg Oral Daily   sodium chloride flush  3 mL Intravenous Q12H   traZODone  50 mg Oral QHS   Continuous Infusions:   LOS: 0 days    Time spent: over 30 min    Lacretia Nicks, MD [facility]   To contact the attending provider between 7A-7P or the covering provider during after hours 7P-7A, please log into the web site [email or web address] and access using universal  password for that web site. If you do not have the password, please call the hospital operator.  06/23/2021, 9:17 PM

## 2021-06-24 ENCOUNTER — Inpatient Hospital Stay (HOSPITAL_COMMUNITY): Payer: Medicare Other

## 2021-06-24 ENCOUNTER — Encounter (HOSPITAL_COMMUNITY): Payer: Self-pay | Admitting: Family Medicine

## 2021-06-24 ENCOUNTER — Encounter (HOSPITAL_COMMUNITY)
Admission: EM | Disposition: A | Discharge status: Home / Self Care | Payer: Self-pay | Source: Home / Self Care | Attending: Internal Medicine

## 2021-06-24 DIAGNOSIS — J90 Pleural effusion, not elsewhere classified: Secondary | ICD-10-CM | POA: Diagnosis not present

## 2021-06-24 HISTORY — PX: CHEST TUBE INSERTION: SHX231

## 2021-06-24 LAB — COMPREHENSIVE METABOLIC PANEL
ALT: 8 U/L (ref 0–44)
AST: 13 U/L — ABNORMAL LOW (ref 15–41)
Albumin: 2.3 g/dL — ABNORMAL LOW (ref 3.5–5.0)
Alkaline Phosphatase: 90 U/L (ref 38–126)
Anion gap: 8 (ref 5–15)
BUN: 12 mg/dL (ref 8–23)
CO2: 26 mmol/L (ref 22–32)
Calcium: 8.6 mg/dL — ABNORMAL LOW (ref 8.9–10.3)
Chloride: 101 mmol/L (ref 98–111)
Creatinine, Ser: 1.02 mg/dL (ref 0.61–1.24)
GFR, Estimated: >60 mL/min (ref >60)
Glucose, Bld: 84 mg/dL (ref 70–99)
Potassium: 4 mmol/L (ref 3.5–5.1)
Sodium: 135 mmol/L (ref 135–145)
Total Bilirubin: 1.5 mg/dL — ABNORMAL HIGH (ref 0.3–1.2)
Total Protein: 6.1 g/dL — ABNORMAL LOW (ref 6.5–8.1)

## 2021-06-24 LAB — MAGNESIUM: Magnesium: 2 mg/dL (ref 1.7–2.4)

## 2021-06-24 LAB — PHOSPHORUS: Phosphorus: 2.8 mg/dL (ref 2.5–4.6)

## 2021-06-24 LAB — CBC WITH DIFFERENTIAL/PLATELET
Abs Immature Granulocytes: 0.05 10*3/uL (ref 0.00–0.07)
Basophils Absolute: 0.1 10*3/uL (ref 0.0–0.1)
Basophils Relative: 1 %
Eosinophils Absolute: 0.5 10*3/uL (ref 0.0–0.5)
Eosinophils Relative: 7 %
HCT: 31.1 % — ABNORMAL LOW (ref 39.0–52.0)
Hemoglobin: 9.3 g/dL — ABNORMAL LOW (ref 13.0–17.0)
Immature Granulocytes: 1 %
Lymphocytes Relative: 12 %
Lymphs Abs: 0.8 10*3/uL (ref 0.7–4.0)
MCH: 25.4 pg — ABNORMAL LOW (ref 26.0–34.0)
MCHC: 29.9 g/dL — ABNORMAL LOW (ref 30.0–36.0)
MCV: 85 fL (ref 80.0–100.0)
Monocytes Absolute: 1.1 10*3/uL — ABNORMAL HIGH (ref 0.1–1.0)
Monocytes Relative: 15 %
Neutro Abs: 4.5 10*3/uL (ref 1.7–7.7)
Neutrophils Relative %: 64 %
Platelets: 345 10*3/uL (ref 150–400)
RBC: 3.66 MIL/uL — ABNORMAL LOW (ref 4.22–5.81)
RDW: 14.2 % (ref 11.5–15.5)
WBC: 6.9 10*3/uL (ref 4.0–10.5)
nRBC: 0 % (ref 0.0–0.2)

## 2021-06-24 LAB — CYTOLOGY - NON PAP

## 2021-06-24 SURGERY — CHEST TUBE INSERTION
Anesthesia: Moderate Sedation

## 2021-06-24 MED ORDER — MORPHINE SULFATE (PF) 2 MG/ML IV SOLN
2.0000 mg | INTRAVENOUS | Status: DC | PRN
  Administered 2021-06-24 – 2021-06-28 (×4): 2 mg via INTRAVENOUS
  Filled 2021-06-24 (×5): qty 1

## 2021-06-24 MED ORDER — METRONIDAZOLE 500 MG/100ML IV SOLN
500.0000 mg | Freq: Three times a day (TID) | INTRAVENOUS | Status: DC
  Administered 2021-06-24 – 2021-07-05 (×30): 500 mg via INTRAVENOUS
  Filled 2021-06-24 (×30): qty 100

## 2021-06-24 MED ORDER — SODIUM CHLORIDE 0.9% FLUSH
10.0000 mL | Freq: Three times a day (TID) | INTRAVENOUS | Status: DC
  Administered 2021-06-24 – 2021-06-28 (×11): 10 mL

## 2021-06-24 MED ORDER — RIVAROXABAN 20 MG PO TABS: 20.0000 mg | ORAL_TABLET | Freq: Every day | ORAL | Status: DC

## 2021-06-24 MED ORDER — SODIUM CHLORIDE 0.9 % IV SOLN
1.0000 g | INTRAVENOUS | Status: DC
  Administered 2021-06-24 – 2021-07-04 (×8): 1 g via INTRAVENOUS
  Filled 2021-06-24 (×11): qty 10

## 2021-06-24 NOTE — Consult Note (Signed)
NAME:  [name] Rice, MRN:  [id number], DOB:  1951/07/26, LOS: 1 ADMISSION DATE:  06/22/2021, CONSULTATION DATE:  8/17 REFERRING MD:  Elvera Lennox, CHIEF COMPLAINT:  recurrent, complex left Pleural effusion   History of Present Illness:  70 year old male. Presented  8/15 w/ cc: 6 week h/o cough and SOB. Went to Urgent Care where a CXR was obtained and showed large Left Pleural effusion so was referred to ER for further eval.  -review of prior imaging showed small effusion s/p CABG sept 21, this was fairly large during his admit for his lap Cholecystectomy in Nov 21. He was subsequently re-admitted later that month  w/ weakness and right sided hydronephrosis at that time Effusion was much larger so  he had thora & 1.85 liters removed this was exudative. He was d/c w/ very small residual effusion. No films on file until he got admitted this time.  -further questioning specifically about onset of dyspnea:  Onset initially around 6 weeks prior.  This was accompanied by productive cough of copious amounts of clear and at times white sputum.  Also had significant nasal congestion and runny nose.  He denied fever.  Denied sick exposure.  Had some chest discomfort with cough.  Did feel more weak than usual but of note he has never fully returned to his typical activity status after his bypass surgery. Underwent US guided left thoracentesis on 8/16. 1.2 liters Hazy pleural fluid evacuated. LDH: 179, orange, cloudy, nucleated cell ct 4480, glucose 72, cultures pending. PCCM asked to eval for recurrent and complicated left effusion  Pertinent  Medical History  Atrial fib (on AC), HTN, choledocholithiasis s/p cholecystitis, GB syndrome, CKD stage III, HL, LBBB, chronic anemia, CABG x 5 1 year ago, Carotid Stenosis    Significant Hospital Events: Including procedures, antibiotic start and stop dates in addition to other pertinent events   8/15 admitted with large loculated left effusion 8/16: Thora 1.2 liters Hazy  pleural fluid evacuated. LDH: 179, orange, cloudy, nucleated cell ct 4480, glucose 72, cultures pending. PCCM asked to eval for recurrent and complicated left effusion   Interim History / Subjective:  Feels better   Objective   Blood pressure 131/69, pulse 75, temperature 98.2 F (36.8 C), temperature source Oral, resp. rate 20, height 6\' 1"  (1.854 m), weight 90.4 kg, SpO2 97 %.        Intake/Output Summary (Last 24 hours) at 06/24/2021 0819 Last data filed at 06/24/2021 06/26/2021 Gross per 24 hour  Intake no documentation  Output 400 ml  Net -400 ml   Filed Weights   06/22/21 1740 06/23/21 1428  Weight: 95.3 kg 90.4 kg    Examination: General: Pleasant 70 year old white male he is currently sitting up in bed he is conversant, and in no acute distress HENT: Normocephalic atraumatic no jugular venous distention is appreciated sclera nonicteric poor dentition Lungs: Clear but diminished in the left base no accessory use currently on room air Cardiovascular: Regular irregular with soft systolic audible murmur Abdomen: Soft nontender no organomegaly positive bowel sounds Extremities: Warm dry with trace dependent edema Neuro: Awake alert oriented no focal deficits GU: Voids spontaneously  Resolved Hospital Problem list     Assessment & Plan:  Complicated/exudative left pleural effusion.  -Does appear to have a loculated fluid collection on the left chest status post successful thoracentesis with fluid evaluation consistent with exudative process.  His pleural history is actually fairly long.  He had small pleural effusion post CABG surgery back in  September 2021, which is to be expected from time to time, and seemed to be stable postoperatively.  In November 2021 he was hospitalized for cholecystitis, and underwent laparoscopic cholecystectomy, during that hospitalization there was an ultrasound of his left chest demonstrating a fairly large pleural effusion, but it does not appear as  though it was further evaluated at that time.  He subsequently was readmitted later that month at which time he did undergo a thoracentesis.  Once again it was consistent with an exudative process, and at time of discharge had not reoccurred.  A reasonable explanation at that time could be a sympathetic process after his abdominal surgery.  Unfortunately he has not had any imaging of his chest since that time.  He now presents with a recurrent left effusion which is loculated in nature.  As the history was somewhat subacute certainly possible that this is a parapneumonic process, however it is really unclear how chronic this collection is.  I do not think he is a candidate for a thoracotomy given his degree of deconditioning Plan Hold anticoagulation (ordered) Okay to eat We will bring him to endoscopy suite today, and proceed with left chest tube placement Will repeat cultures and cytology Place chest tube to drainage Repeat chest imaging, may even require repeat CT of chest post drainage May need to consider tPA/DNase to completely drain chest   Best Practice (right click and "Reselect all SmartList Selections" daily)   Per primary  Labs   CBC: Recent Labs  Lab 06/22/21 1620 06/23/21 0206 06/24/21 0226  WBC 7.1 7.0 6.9  NEUTROABS 5.3  --  4.5  HGB 10.4* 9.9* 9.3*  HCT 35.3* 33.8* 31.1*  MCV 85.9 87.1 85.0  PLT 404* 368 345    Basic Metabolic Panel: Recent Labs  Lab 06/22/21 1620 06/23/21 0206 06/24/21 0226  NA 136 137 135  K 4.3 4.6 4.0  CL 101 102 101  CO2 25 26 26   GLUCOSE 99 93 84  BUN 16 13 12   CREATININE 1.06 1.00 1.02  CALCIUM 8.8* 8.8* 8.6*  MG  --   --  2.0  PHOS  --   --  2.8   GFR: Estimated Creatinine Clearance: 78.3 mL/min (by C-G formula based on SCr of 1.02 mg/dL). Recent Labs  Lab 06/22/21 1620 06/23/21 0206 06/24/21 0226  WBC 7.1 7.0 6.9    Liver Function Tests: Recent Labs  Lab 06/23/21 0206 06/24/21 0226  AST 13* 13*  ALT 8 8   ALKPHOS 98 90  BILITOT 1.4* 1.5*  PROT 6.6 6.1*  ALBUMIN 2.5* 2.3*   No results for input(s): LIPASE, AMYLASE in the last 168 hours. No results for input(s): AMMONIA in the last 168 hours.  ABG    Component Value Date/Time   PHART 7.327 (L) 07/29/2020 2140   PCO2ART 35.6 07/29/2020 2140   PO2ART 87 07/29/2020 2140   HCO3 18.6 (L) 07/29/2020 2140   TCO2 20 (L) 07/29/2020 2140   ACIDBASEDEF 7.0 (H) 07/29/2020 2140   O2SAT 96.0 07/29/2020 2140     Coagulation Profile: Recent Labs  Lab 06/23/21 0206  INR 1.7*    Cardiac Enzymes: No results for input(s): CKTOTAL, CKMB, CKMBINDEX, TROPONINI in the last 168 hours.  HbA1C: Hgb A1c MFr Bld  Date/Time Value Ref Range Status  07/26/2020 05:15 AM 5.2 4.8 - 5.6 % Final    Comment:    (NOTE) Pre diabetes:          5.7%-6.4%  Diabetes:              >  6.4%  Glycemic control for   <7.0% adults with diabetes     CBG: No results for input(s): GLUCAP in the last 168 hours.  Review of Systems:   Review of Systems  Constitutional:  Positive for malaise/fatigue. Negative for fever.  HENT:  Positive for congestion.   Eyes: Negative.   Respiratory:  Positive for cough, sputum production and shortness of breath.   Cardiovascular:  Positive for chest pain and leg swelling.  Gastrointestinal: Negative.   Genitourinary: Negative.   Musculoskeletal: Negative.   Skin: Negative.   Neurological: Negative.   Endo/Heme/Allergies: Negative.   Psychiatric/Behavioral: Negative.      Past Medical History:  He,  has a past medical history of Atrial fibrillation (HCC), Carotid artery stenosis, Coronary artery disease, Dysrhythmia, Hypertension, and LBBB (left bundle branch block).   Surgical History:   Past Surgical History:  Procedure Laterality Date   CARDIOVERSION N/A 09/28/2016   Procedure: CARDIOVERSION;  Surgeon: Yates DecampJay Ganji, MD;  [facility];  Service: Cardiovascular;  Laterality: N/A;   CATARACT EXTRACTION, BILATERAL      CHOLECYSTECTOMY N/A 09/18/2020   Procedure: LAPAROSCOPIC PARTIAL CHOLECYSTECTOMY;  Surgeon: Ovidio KinNewman, David, MD;  Location: [facility] ORS;  Service: General;  Laterality: N/A;   CLIPPING OF ATRIAL APPENDAGE Left 07/29/2020   Procedure: CLIPPING OF ATRIAL APPENDAGE;  Surgeon: Linden DolinAtkins, Broadus Z, MD;  Location: [facility] OR;  Service: Open Heart Surgery;  Laterality: Left;   CORONARY ARTERY BYPASS GRAFT N/A 07/29/2020   Procedure: CORONARY ARTERY BYPASS GRAFTING (CABG) TIMES FOUR USING BILATERAL INTERNAL MAMMARIES AND LEFT RADIAL ARTERY;  Surgeon: Linden DolinAtkins, Broadus Z, MD;  Location: [facility] OR;  Service: Open Heart Surgery;  Laterality: N/A;   ERCP N/A 09/17/2020   Procedure: ENDOSCOPIC RETROGRADE CHOLANGIOPANCREATOGRAPHY (ERCP);  Surgeon: Meryl DareStark, Malcolm T, MD;  [facility];  Service: Endoscopy;  Laterality: N/A;   IR THORACENTESIS ASP PLEURAL SPACE W/IMG GUIDE  06/23/2021   LEFT HEART CATH AND CORONARY ANGIOGRAPHY N/A 07/25/2020   Procedure: LEFT HEART CATH AND CORONARY ANGIOGRAPHY;  Surgeon: Yates DecampGanji, Jay, MD;  Location: [facility];  Service: Cardiovascular;  Laterality: N/A;   MAZE N/A 07/29/2020   Procedure: MAZE;  Surgeon: Linden DolinAtkins, Broadus Z, MD;  Location: [facility];  Service: Open Heart Surgery;  Laterality: N/A;   RADIAL ARTERY HARVEST Left 07/29/2020   Procedure: RADIAL ARTERY HARVEST;  Surgeon: Linden DolinAtkins, Broadus Z, MD;  Location: [facility];  Service: Open Heart Surgery;  Laterality: Left;   REMOVAL OF STONES  09/17/2020   Procedure: REMOVAL OF STONES;  Surgeon: Meryl DareStark, Malcolm T, MD;  Location: [facility];  Service: Endoscopy;;   SPHINCTEROTOMY  09/17/2020   Procedure: Dennison MascotSPHINCTEROTOMY;  Surgeon: Meryl DareStark, Malcolm T, MD;  Location: [facility];  Service: Endoscopy;;   TEE WITHOUT CARDIOVERSION N/A 07/29/2020   Procedure: TRANSESOPHAGEAL ECHOCARDIOGRAM (TEE);  Surgeon: Linden DolinAtkins, Broadus Z, MD;  [facility];  Service: Open Heart Surgery;  Laterality: N/A;     Social History:   reports that he has never  smoked. He has never used smokeless tobacco. He reports that he does not drink alcohol and does not use drugs.   Family History:  His family history includes Heart disease in his father and mother; Hyperlipidemia in his mother; Hypertension in an other family member.   Allergies No Known Allergies   Home Medications  Prior to Admission medications   Medication Sig Start Date End Date Taking? Authorizing Provider  acetaminophen (TYLENOL) 500 MG tablet Take 1,000 mg by mouth every 6 (six) hours  as needed for moderate pain.    Yes [provider]  aspirin EC 81 MG tablet Take 81 mg by mouth daily. Swallow whole.   Yes [provider]  metoprolol tartrate (LOPRESSOR) 25 MG tablet Take 0.5 tablets (12.5 mg total) by mouth daily. 06/03/21 09/01/21 Yes Tolia, Sunit, DO  olmesartan (BENICAR) 5 MG tablet TAKE 2 TABLETS BY MOUTH EVERY DAY 04/22/21  Yes Tolia, Sunit, DO  rosuvastatin (CRESTOR) 20 MG tablet Take 1 tablet (20 mg total) by mouth at bedtime. 04/28/21 07/27/21 Yes Tolia, Sunit, DO  XARELTO 20 MG TABS tablet TAKE 1 TABLET BY MOUTH IN  THE EVENING AFTER DINNER Patient taking differently: Take 20 mg by mouth daily with supper. 09/22/20  Yes Tessa Lerner, DO     Critical care time: NA    Simonne Martinet ACNP-BC [facility] Pulmonary/Critical Care Pager # [phone] OR # [phone] if no answer

## 2021-06-24 NOTE — Progress Notes (Signed)
PROGRESS NOTE  [name] Rice YQM:[id number] DOB: 01/19/1951 DOA: 06/22/2021 PCP: [name], PA-C   LOS: 1 day   Brief Narrative / Interim history: [name] Rice is a 70 y.o. male with medical history significant of anemia, A. fib, hypertension, cholecystitis and choledocholithiasis status post cholecystectomy, CKD 3, CAD s/p CABG, gilberts syndrome, hyperlipidemia, left bundle branch block who presents with ongoing shortness of breath.  He was found to have a large L sided effusion.  Admitted for management.  He had a left-sided effusion last year as well in September during his admission for laparoscopic cholecystectomy.  Subjective / 24h Interval events: Doing well this morning, feels like shortness of breath slightly better but still has coughing  Assessment & Plan: Principal Problem Recurrent left-sided pleural effusion-loculated, appears to be complicated.  Exudative in nature.  Pulmonary consulted today, plans for chest tube this afternoon -Repeat cytology, cultures, further management per pulmonary  Active Problems Acute hypoxic respiratory failure-resolved, on room air this morning  History of A. Fib -continue metoprolol, hold anticoagulation due to chest tube placement  Essential hypertension-continue metoprolol  CAD, status post CABG 2021-no chest pain, stable  Hyperlipidemia-continue statin  Normocytic anemia-of chronic disease   Scheduled Meds:  metoprolol tartrate  12.5 mg Oral Daily   rosuvastatin  20 mg Oral Daily   sodium chloride flush  3 mL Intravenous Q12H   traZODone  50 mg Oral QHS   Continuous Infusions: PRN Meds:.acetaminophen **OR** acetaminophen, polyethylene glycol  Diet Orders (From admission, onward)     Start     Ordered   06/22/21 1901  Diet Heart Room service appropriate? Yes; Fluid consistency: Thin  Diet effective now       Question Answer Comment  Room service appropriate? Yes   Fluid consistency: Thin      06/22/21 1902             DVT prophylaxis: Place and maintain sequential compression device Start: 06/23/21 1852     Code Status: Full Code  Family Communication: no family at bedside   Status is: Inpatient  Remains inpatient appropriate because:Inpatient level of care appropriate due to severity of illness  Dispo: The patient is from: Home              Anticipated d/c is to: Home              Patient currently is not medically stable to d/c.   Difficult to place patient No   Level of care: Telemetry Medical  Consultants:  PCCM  Procedures:  none  Microbiology  none  Antimicrobials: none    Objective: Vitals:   06/23/21 1428 06/23/21 2233 06/24/21 0730 06/24/21 0949  BP:  131/69  119/70  Pulse:  75  93  Resp: 20   18  Temp:  98.2 F (36.8 C)    TempSrc:  Oral    SpO2:  97% 96% 98%  Weight: 90.4 kg     Height: 6\' 1"  (1.854 m)       Intake/Output Summary (Last 24 hours) at 06/24/2021 1233 Last data filed at 06/24/2021 06/26/2021 Gross per 24 hour  Intake 3 ml  Output 400 ml  Net -397 ml   Filed Weights   06/22/21 1740 06/23/21 1428  Weight: 95.3 kg 90.4 kg    Examination:  Constitutional: NAD Eyes: no scleral icterus ENMT: Mucous membranes are moist.  Neck: normal, supple Respiratory: clear to auscultation bilaterally, diminished at the bases, no wheezing, no crackles. Normal respiratory  effort.  Cardiovascular: Regular rate and rhythm, no murmurs / rubs / gallops. No LE edema. Good peripheral pulses Abdomen: non distended, no tenderness. Bowel sounds positive.  Musculoskeletal: no clubbing / cyanosis.  Skin: no rashes Neurologic: CN 2-12 grossly intact. Strength 5/5 in all 4.    Data Reviewed: I have independently reviewed following labs and imaging studies   CBC: Recent Labs  Lab 06/22/21 1620 06/23/21 0206 06/24/21 0226  WBC 7.1 7.0 6.9  NEUTROABS 5.3  --  4.5  HGB 10.4* 9.9* 9.3*  HCT 35.3* 33.8* 31.1*  MCV 85.9 87.1 85.0  PLT 404* 368 345    Basic Metabolic Panel: Recent Labs  Lab 06/22/21 1620 06/23/21 0206 06/24/21 0226  NA 136 137 135  K 4.3 4.6 4.0  CL 101 102 101  CO2 25 26 26   GLUCOSE 99 93 84  BUN 16 13 12   CREATININE 1.06 1.00 1.02  CALCIUM 8.8* 8.8* 8.6*  MG  --   --  2.0  PHOS  --   --  2.8   Liver Function Tests: Recent Labs  Lab 06/23/21 0206 06/24/21 0226  AST 13* 13*  ALT 8 8  ALKPHOS 98 90  BILITOT 1.4* 1.5*  PROT 6.6 6.1*  ALBUMIN 2.5* 2.3*   Coagulation Profile: Recent Labs  Lab 06/23/21 0206  INR 1.7*   HbA1C: No results for input(s): HGBA1C in the last 72 hours. CBG: No results for input(s): GLUCAP in the last 168 hours.  Recent Results (from the past 240 hour(s))  Resp Panel by RT-PCR (Flu A&B, Covid) Nasopharyngeal Swab     Status: None   Collection Time: 06/22/21  5:35 PM   Specimen: Nasopharyngeal Swab; Nasopharyngeal(NP) swabs in vial transport medium  Result Value Ref Range Status   SARS Coronavirus 2 by RT PCR NEGATIVE NEGATIVE Final    Comment: (NOTE) SARS-CoV-2 target nucleic acids are NOT DETECTED.  The SARS-CoV-2 RNA is generally detectable in upper respiratory specimens during the acute phase of infection. The lowest concentration of SARS-CoV-2 viral copies this assay can detect is 138 copies/mL. A negative result does not preclude SARS-Cov-2 infection and should not be used as the sole basis for treatment or other patient management decisions. A negative result may occur with  improper specimen collection/handling, submission of specimen other than nasopharyngeal swab, presence of viral mutation(s) within the areas targeted by this assay, and inadequate number of viral copies(<138 copies/mL). A negative result must be combined with clinical observations, patient history, and epidemiological information. The expected result is Negative.  Fact Sheet for Patients:  06/25/21  Fact Sheet for Healthcare Providers:   06/24/21  This test is no t yet approved or cleared by the [employer] FDA and  has been authorized for detection and/or diagnosis of SARS-CoV-2 by FDA under an Emergency Use Authorization (EUA). This EUA will remain  in effect (meaning this test can be used) for the duration of the COVID-19 declaration under Section 564(b)(1) of the Act, 21 U.S.C.section 360bbb-3(b)(1), unless the authorization is terminated  or revoked sooner.       Influenza A by PCR NEGATIVE NEGATIVE Final   Influenza B by PCR NEGATIVE NEGATIVE Final    Comment: (NOTE) The Xpert Xpress SARS-CoV-2/FLU/RSV plus assay is intended as an aid in the diagnosis of influenza from Nasopharyngeal swab specimens and should not be used as a sole basis for treatment. Nasal washings and aspirates are unacceptable for Xpert Xpress SARS-CoV-2/FLU/RSV testing.  Fact Sheet for Patients: [employer]it  Fact  Sheet for Healthcare Providers: Serious[employer]ker.it  This test is not yet approved or cleared by the [place] and has been authorized for detection and/or diagnosis of SARS-CoV-2 by FDA under an Emergency Use Authorization (EUA). This EUA will remain in effect (meaning this test can be used) for the duration of the COVID-19 declaration under Section 564(b)(1) of the Act, 21 U.S.C. section 360bbb-3(b)(1), unless the authorization is terminated or revoked.  Performed at [facility], [address], [place], [place] [address]   Body fluid culture w Gram Stain     Status: None (Preliminary result)   Collection Time: 06/23/21 11:10 AM   Specimen: Lung, Left; Pleural Fluid  Result Value Ref Range Status   Specimen Description PLEURAL FLUID  Final   Special Requests LEFT LUNG  Final   Gram Stain   Final    MODERATE WBC PRESENT, PREDOMINANTLY MONONUCLEAR NO ORGANISMS SEEN    Culture   Final    NO GROWTH < 24  HOURS Performed at [facility], [address], [place], [place] [address]    Report Status PENDING  Incomplete     Radiology Studies: DG CHEST PORT 1 VIEW  Result Date: 06/24/2021 CLINICAL DATA:  Shortness of breath. EXAM: PORTABLE CHEST 1 VIEW COMPARISON:  Chest x-ray from yesterday. FINDINGS: Stable cardiomediastinal silhouette. Unchanged large loculated left pleural effusion with associated left lung atelectasis. The right lung is clear. No pneumothorax. No acute osseous abnormality. IMPRESSION: 1. Unchanged large loculated left pleural effusion. Electronically Signed   By: Obie Dredge M.D.   On: 06/24/2021 07:06     Pamella Pert, MD, PhD [facility]  Between 7 am - 7 pm I am available, please contact me via Amion (for emergencies) or Securechat (non urgent messages)  Between 7 pm - 7 am I am not available, please contact night coverage MD/APP via Amion

## 2021-06-24 NOTE — Progress Notes (Signed)
Patient back to room 2W05. Patient is A&O*4. Able to verbalize his needs. No CO pain or discomfort. Chest tube in place to his posterior chest draining well. Drain marked at 850 ml at this time. Chest tube drain left to drain on gravity per MD. Will continue to monitor.

## 2021-06-24 NOTE — Progress Notes (Signed)
At 349pm when the patient came back from [facility] his chest tube drain measured at . At 454pm, the chest tube was full and measured . Patient had 2150 ml output in the chest tube in last hour. Chest tube clamped. Charge RN at bedside at this time. MD made aware. Patient initially CO chest pain which was resolved after PRN tylenol. Spoke to critical care regarding the situation. Order received to change the [place] drainage system and to notify if chest tube puts out over 1L in next hour. [place] drainage changed. Will continue to closely monitor.

## 2021-06-24 NOTE — Op Note (Signed)
Insertion of Chest Tube Procedure Note  [name] Rice  [id number]  [birth year]  Date:06/24/21  Time:3:14 PM    Provider Performing: Martina Sinner   Procedure: Chest Tube Insertion [phone])  Indication(s) Effusion  Consent Risks of the procedure as well as the alternatives and risks of each were explained to the patient and/or caregiver.  Consent for the procedure was obtained and is signed in the bedside chart  Anesthesia Topical only with 1% lidocaine    Time Out Verified patient identification, verified procedure, site/side was marked, verified correct patient position, special equipment/implants available, medications/allergies/relevant history reviewed, required imaging and test results available.   Sterile Technique Maximal sterile technique including full sterile barrier drape, hand hygiene, sterile gown, sterile gloves, mask, hair covering, sterile ultrasound probe cover (if used).   Procedure Description Ultrasound used to identify appropriate pleural anatomy for placement and overlying skin marked. Area of placement cleaned and draped in sterile fashion.  A 14 French pigtail pleural catheter was placed into the left pleural space using Seldinger technique. Appropriate return of fluid was obtained.  The tube was connected to atrium and placed on -20 cm H2O wall suction.   Complications/Tolerance None; patient tolerated the procedure well. Chest X-ray is ordered to verify placement.   EBL Minimal  Specimen(s) none

## 2021-06-24 NOTE — Progress Notes (Signed)
Patient transported to ENDO for chest tube placement at this time via wheel chair. Patient hemodynamically stable during transportation.

## 2021-06-24 NOTE — Plan of Care (Signed)

## 2021-06-24 NOTE — Progress Notes (Signed)
Chest tube placed on suction at this time per order.

## 2021-06-24 NOTE — Evaluation (Signed)
Physical Therapy Evaluation Patient Details Name: [name] Rice MRN: [id number] DOB: Mar 17, 1951 Today's Date: 06/24/2021   History of Present Illness  70 y.o. male presents to [facility] hospital on 06/22/2021 with ongoing SOB. Pt found to have large L sided pleural effusion. Pt underwent thoracentesis on 8/16. PMH includes anemia, A. fib, hypertension, cholecystitis status post cholecystectomy, CKD 3, CAD, Gill Bears syndrome, hyperlipidemia, left bundle branch block.  Clinical Impression  Pt presents to PT with deficits in activity tolerance, although not far from baseline. Pt reports mild DOE with ambulation but is able to ambulate for extended household distances without physical assistance. Pt will benefit from further acute PT services to continue improving activity tolerance and to further challenge balance. Pt is encouraged to ambulate out of the room at least 3 times daily.    Follow Up Recommendations No PT follow up    Equipment Recommendations  None recommended by PT    Recommendations for Other Services       Precautions / Restrictions Precautions Precautions: Fall Restrictions Weight Bearing Restrictions: No      Mobility  Bed Mobility Overal bed mobility: Modified Independent             General bed mobility comments: increased time    Transfers Overall transfer level: Needs assistance Equipment used: None Transfers: Sit to/from Stand Sit to Stand: Supervision            Ambulation/Gait Ambulation/Gait assistance: Supervision Gait Distance (Feet): 200 Feet Assistive device: None Gait Pattern/deviations: Step-through pattern Gait velocity: functional Gait velocity interpretation: 1.31 - 2.62 ft/sec, indicative of limited community ambulator General Gait Details: pt with steady step-through gait  Stairs            Wheelchair Mobility    Modified Rankin (Stroke Patients Only)       Balance Overall balance assessment: Needs  assistance Sitting-balance support: No upper extremity supported;Feet supported Sitting balance-Leahy Scale: Good     Standing balance support: No upper extremity supported;During functional activity Standing balance-Leahy Scale: Good                               Pertinent Vitals/Pain Pain Assessment: No/denies pain    Home Living Family/patient expects to be discharged to:: Private residence Living Arrangements: Alone Available Help at Discharge: Family;Friend(s);Available PRN/intermittently Type of Home: House Home Access: Level entry     Home Layout: One level Home Equipment: Walker - 4 wheels      Prior Function Level of Independence: Independent         Comments: pt reports independent mobility, had progressed to no longer utilizing rollator or support of furniture     Hand Dominance   Dominant Hand: Right    Extremity/Trunk Assessment   Upper Extremity Assessment Upper Extremity Assessment: Overall WFL for tasks assessed    Lower Extremity Assessment Lower Extremity Assessment: Generalized weakness    Cervical / Trunk Assessment Cervical / Trunk Assessment: Normal  Communication   Communication: No difficulties  Cognition Arousal/Alertness: Awake/alert Behavior During Therapy: WFL for tasks assessed/performed Overall Cognitive Status: Within Functional Limits for tasks assessed                                        General Comments General comments (skin integrity, edema, etc.): VSS on RA, sats ranging from 92-100% with mobility. Pt does  report mild DOE with ambulation.    Exercises     Assessment/Plan    PT Assessment Patient needs continued PT services  PT Problem List Decreased strength;Decreased activity tolerance;Cardiopulmonary status limiting activity       PT Treatment Interventions Gait training;Functional mobility training;Therapeutic activities;Therapeutic exercise;Balance training;Patient/family  education    PT Goals (Current goals can be found in the Care Plan section)  Acute Rehab PT Goals Patient Stated Goal: to return home PT Goal Formulation: With patient Time For Goal Achievement: 07/08/21 Potential to Achieve Goals: Good Additional Goals Additional Goal #1: Pt will report 2/10 DOE or less when ambulating for >250' to demonstrate improvements in cardiopulmonary endurance Additional Goal #2: Pt will score >19/24 on DGI to indicate a reduced risk for falls    Frequency Min 3X/week   Barriers to discharge        Co-evaluation               AM-PAC PT "6 Clicks" Mobility  Outcome Measure Help needed turning from your back to your side while in a flat bed without using bedrails?: None Help needed moving from lying on your back to sitting on the side of a flat bed without using bedrails?: None Help needed moving to and from a bed to a chair (including a wheelchair)?: A Little Help needed standing up from a chair using your arms (e.g., wheelchair or bedside chair)?: A Little Help needed to walk in hospital room?: A Little Help needed climbing 3-5 steps with a railing? : A Little 6 Click Score: 20    End of Session   Activity Tolerance: Patient tolerated treatment well Patient left: in bed;with call bell/phone within reach Nurse Communication: Mobility status PT Visit Diagnosis: Other abnormalities of gait and mobility (R26.89)    Time: 3335-4562 PT Time Calculation (min) (ACUTE ONLY): 13 min   Charges:   PT Evaluation $PT Eval Low Complexity: 1 Low          Arlyss Gandy, PT, DPT Acute Rehabilitation Pager: [phone]   Arlyss Gandy 06/24/2021, 2:09 PM

## 2021-06-25 ENCOUNTER — Encounter (HOSPITAL_COMMUNITY): Payer: Self-pay | Admitting: Pulmonary Disease

## 2021-06-25 ENCOUNTER — Inpatient Hospital Stay (HOSPITAL_COMMUNITY): Payer: Medicare Other

## 2021-06-25 DIAGNOSIS — J90 Pleural effusion, not elsewhere classified: Secondary | ICD-10-CM | POA: Diagnosis not present

## 2021-06-25 LAB — BASIC METABOLIC PANEL
Anion gap: 8 (ref 5–15)
BUN: 13 mg/dL (ref 8–23)
CO2: 28 mmol/L (ref 22–32)
Calcium: 8.5 mg/dL — ABNORMAL LOW (ref 8.9–10.3)
Chloride: 99 mmol/L (ref 98–111)
Creatinine, Ser: 1.07 mg/dL (ref 0.61–1.24)
GFR, Estimated: >60 mL/min (ref >60)
Glucose, Bld: 101 mg/dL — ABNORMAL HIGH (ref 70–99)
Potassium: 4.2 mmol/L (ref 3.5–5.1)
Sodium: 135 mmol/L (ref 135–145)

## 2021-06-25 LAB — PH, BODY FLUID: pH, Body Fluid: 7.4

## 2021-06-25 LAB — CBC
HCT: 32.3 % — ABNORMAL LOW (ref 39.0–52.0)
Hemoglobin: 10 g/dL — ABNORMAL LOW (ref 13.0–17.0)
MCH: 26 pg (ref 26.0–34.0)
MCHC: 31 g/dL (ref 30.0–36.0)
MCV: 83.9 fL (ref 80.0–100.0)
Platelets: 363 10*3/uL (ref 150–400)
RBC: 3.85 MIL/uL — ABNORMAL LOW (ref 4.22–5.81)
RDW: 14.3 % (ref 11.5–15.5)
WBC: 9.3 10*3/uL (ref 4.0–10.5)
nRBC: 0 % (ref 0.0–0.2)

## 2021-06-25 NOTE — Progress Notes (Signed)
PROGRESS NOTE  [name] Rice:[id number] DOB: 24-Jan-1951 DOA: 06/22/2021 PCP: [name], PA-C   LOS: 2 days   Brief Narrative / Interim history: [name] Rice is a 70 y.o. male with medical history significant of anemia, A. fib, hypertension, cholecystitis and choledocholithiasis status post cholecystectomy, CKD 3, CAD s/p CABG, gilberts syndrome, hyperlipidemia, left bundle branch block who presents with ongoing shortness of breath.  He was found to have a large L sided effusion.  Admitted for management.  He had a left-sided effusion last year as well in September during his admission for laparoscopic cholecystectomy.  Subjective / 24h Interval events: Feels a whole lot better this morning since chest tube was placed yesterday.  Denies any chest pain  Assessment & Plan: Principal Problem Recurrent left-sided pleural effusion-loculated, appears to be complicated.  Exudative in nature.  Status post chest tube placement on 8/17 -Repeat cytology, cultures, further management per pulmonary -CT scan of the chest today pending, may need TCV input  Active Problems Acute hypoxic respiratory failure-resolved, on room air this morning  History of A. Fib -continue metoprolol, hold anticoagulation due to chest tube presence  Essential hypertension-continue metoprolol  CAD, status post CABG 2021-no chest pain, stable  Hyperlipidemia-continue statin  Normocytic anemia-of chronic disease   Scheduled Meds:  metoprolol tartrate  12.5 mg Oral Daily   rosuvastatin  20 mg Oral Daily   sodium chloride flush  10 mL Intracatheter Q8H   sodium chloride flush  3 mL Intravenous Q12H   traZODone  50 mg Oral QHS   Continuous Infusions:  cefTRIAXone (ROCEPHIN)  IV 1 g (06/24/21 1716)   metronidazole 500 mg (06/25/21 0910)   PRN Meds:.acetaminophen **OR** acetaminophen, morphine injection, polyethylene glycol  Diet Orders (From admission, onward)     Start     Ordered   06/22/21 1901   Diet Heart Room service appropriate? Yes; Fluid consistency: Thin  Diet effective now       Question Answer Comment  Room service appropriate? Yes   Fluid consistency: Thin      06/22/21 1902            DVT prophylaxis: Place and maintain sequential compression device Start: 06/23/21 1852     Code Status: Full Code  Family Communication: no family at bedside   Status is: Inpatient  Remains inpatient appropriate because:Inpatient level of care appropriate due to severity of illness  Dispo: The patient is from: Home              Anticipated d/c is to: Home              Patient currently is not medically stable to d/c.   Difficult to place patient No   Level of care: Telemetry Medical  Consultants:  PCCM  Procedures:  none  Microbiology  none  Antimicrobials: none    Objective: Vitals:   06/25/21 0513 06/25/21 0853 06/25/21 0858 06/25/21 1201  BP: 122/67 129/66 129/66 (!) 114/45  Pulse: 84 (!) 54 84 64  Resp: 18 18  20   Temp: 97.7 F (36.5 C) 98.5 F (36.9 C)  98.2 F (36.8 C)  TempSrc:  Oral    SpO2: 100% 98%  94%  Weight:      Height:        Intake/Output Summary (Last 24 hours) at 06/25/2021 1349 Last data filed at 06/25/2021 0900 Gross per 24 hour  Intake 263.83 ml  Output 3350 ml  Net -3086.17 ml    06/27/2021  06/22/21 1740 06/23/21 1428 06/25/21 0500  Weight: 95.3 kg 90.4 kg 90.3 kg    Examination:  Constitutional: No distress Eyes: No scleral icterus ENMT: mmm Neck: normal, supple Respiratory: Clear bilaterally, no wheezing or crackles Cardiovascular: Regular rate and rhythm, no murmurs, no edema Abdomen: soft, NT, ND, bowel sounds positive Musculoskeletal: no clubbing / cyanosis.  Skin: No rash seen  Data Reviewed: I have independently reviewed following labs and imaging studies   CBC: Recent Labs  Lab 06/22/21 1620 06/23/21 0206 06/24/21 0226 06/25/21 0057  WBC 7.1 7.0 6.9 9.3  NEUTROABS 5.3  --  4.5  --   HGB  10.4* 9.9* 9.3* 10.0*  HCT 35.3* 33.8* 31.1* 32.3*  MCV 85.9 87.1 85.0 83.9  PLT 404* 368 345 363    Basic Metabolic Panel: Recent Labs  Lab 06/22/21 1620 06/23/21 0206 06/24/21 0226 06/25/21 0057  NA 136 137 135 135  K 4.3 4.6 4.0 4.2  CL 101 102 101 99  CO2 25 26 26 28   GLUCOSE 99 93 84 101*  BUN 16 13 12 13   CREATININE 1.06 1.00 1.02 1.07  CALCIUM 8.8* 8.8* 8.6* 8.5*  MG  --   --  2.0  --   PHOS  --   --  2.8  --     Liver Function Tests: Recent Labs  Lab 06/23/21 0206 06/24/21 0226  AST 13* 13*  ALT 8 8  ALKPHOS 98 90  BILITOT 1.4* 1.5*  PROT 6.6 6.1*  ALBUMIN 2.5* 2.3*    Coagulation Profile: Recent Labs  Lab 06/23/21 0206  INR 1.7*    HbA1C: No results for input(s): HGBA1C in the last 72 hours. CBG: No results for input(s): GLUCAP in the last 168 hours.  Recent Results (from the past 240 hour(s))  Resp Panel by RT-PCR (Flu A&B, Covid) Nasopharyngeal Swab     Status: None   Collection Time: 06/22/21  5:35 PM   Specimen: Nasopharyngeal Swab; Nasopharyngeal(NP) swabs in vial transport medium  Result Value Ref Range Status   SARS Coronavirus 2 by RT PCR NEGATIVE NEGATIVE Final    Comment: (NOTE) SARS-CoV-2 target nucleic acids are NOT DETECTED.  The SARS-CoV-2 RNA is generally detectable in upper respiratory specimens during the acute phase of infection. The lowest concentration of SARS-CoV-2 viral copies this assay can detect is 138 copies/mL. A negative result does not preclude SARS-Cov-2 infection and should not be used as the sole basis for treatment or other patient management decisions. A negative result may occur with  improper specimen collection/handling, submission of specimen other than nasopharyngeal swab, presence of viral mutation(s) within the areas targeted by this assay, and inadequate number of viral copies(<138 copies/mL). A negative result must be combined with clinical observations, patient history, and  epidemiological information. The expected result is Negative.  Fact Sheet for Patients:  06/25/21  Fact Sheet for Healthcare Providers:  06/24/21  This test is no t yet approved or cleared by the [employer] FDA and  has been authorized for detection and/or diagnosis of SARS-CoV-2 by FDA under an Emergency Use Authorization (EUA). This EUA will remain  in effect (meaning this test can be used) for the duration of the COVID-19 declaration under Section 564(b)(1) of the Act, 21 U.S.C.section 360bbb-3(b)(1), unless the authorization is terminated  or revoked sooner.       Influenza A by PCR NEGATIVE NEGATIVE Final   Influenza B by PCR NEGATIVE NEGATIVE Final    Comment: (NOTE) The Xpert Xpress SARS-CoV-2/FLU/RSV plus  assay is intended as an aid in the diagnosis of influenza from Nasopharyngeal swab specimens and should not be used as a sole basis for treatment. Nasal washings and aspirates are unacceptable for Xpert Xpress SARS-CoV-2/FLU/RSV testing.  Fact Sheet for Patients: [employer]  Fact Sheet for Healthcare Providers: [employer]  This test is not yet approved or cleared by the [place] FDA and has been authorized for detection and/or diagnosis of SARS-CoV-2 by FDA under an Emergency Use Authorization (EUA). This EUA will remain in effect (meaning this test can be used) for the duration of the COVID-19 declaration under Section 564(b)(1) of the Act, 21 U.S.C. section 360bbb-3(b)(1), unless the authorization is terminated or revoked.  Performed at [facility], [address] [place], [place] [address]   Body fluid culture w Gram Stain     Status: None (Preliminary result)   Collection Time: 06/23/21 11:10 AM   Specimen: Lung, Left; Pleural Fluid  Result Value Ref Range Status   Specimen Description PLEURAL FLUID  Final    Special Requests LEFT LUNG  Final   Gram Stain   Final    MODERATE WBC PRESENT, PREDOMINANTLY MONONUCLEAR NO ORGANISMS SEEN    Culture   Final    NO GROWTH 2 DAYS Performed at [facility], [address] [place], [place] [address]    Report Status PENDING  Incomplete      Radiology Studies: DG CHEST PORT 1 VIEW  Result Date: 06/25/2021 CLINICAL DATA:  Shortness of breath EXAM: PORTABLE CHEST 1 VIEW COMPARISON:  06/24/2021 FINDINGS: Pigtail drainage catheter is seen near the left costophrenic angle, more lateral in position compared to the previous exam. New small-moderate left-sided pneumothorax, approximately 15% volume. Near complete resolution of previously seen left-sided pleural effusion. Stable heart size status post CABG. No abnormal shift of the heart or mediastinal structures. Right lung remains clear. IMPRESSION: 1. New small-moderate left-sided pneumothorax (approximately 15% volume). Pigtail drainage catheter at the left costophrenic angle, more lateral in position compared to previous exam. 2. Resolved left pleural effusion. These results will be called to the ordering clinician or representative by the Radiologist Assistant, and communication documented in the PACS or [employer]. Electronically Signed   By: Duanne Guess D.O.   On: 06/25/2021 08:41   DG CHEST PORT 1 VIEW  Result Date: 06/24/2021 CLINICAL DATA:  Left chest tube placement EXAM: PORTABLE CHEST 1 VIEW COMPARISON:  06/24/2021 FINDINGS: A pigtail drainage catheter projects over the left upper abdominal quadrant, which may be located within the inferior aspect posterior pleural space. Evaluation is limited by lack of orthogonal view. Moderate-large loculated left-sided pleural effusion, slightly decreased in volume compared to the prior study. Slightly improving aeration within the left lung field. Right lung remains clear. Stable heart size status post CABG. No pneumothorax. IMPRESSION: A pigtail drainage  catheter projects over the left upper abdominal quadrant, which is presumably located within the inferior aspect posterior pleural space. Evaluation is limited by lack of orthogonal view. Slight interval decrease in left-sided pleural effusion with slightly improving aeration of the left lung field. No pneumothorax. Electronically Signed   By: Duanne Guess D.O.   On: 06/24/2021 16:01     Pamella Pert, MD, PhD [facility]  Between 7 am - 7 pm I am available, please contact me via Amion (for emergencies) or Securechat (non urgent messages)  Between 7 pm - 7 am I am not available, please contact night coverage MD/APP via Amion

## 2021-06-25 NOTE — Progress Notes (Signed)
Got call from radiology with result of new left sided pneumothorax, 15% volume. MD Pamella Pert made aware.

## 2021-06-25 NOTE — Consult Note (Signed)
NAME:  [name] Rice, MRN:  [id number], DOB:  [birth year], LOS: 2 ADMISSION DATE:  06/22/2021, CONSULTATION DATE:  8/17 REFERRING MD:  Elvera Lennox, CHIEF COMPLAINT:  recurrent, complex left Pleural effusion   History of Present Illness:  70 year old male. Presented  8/15 w/ cc: 6 week h/o cough and SOB. Went to Urgent Care where a CXR was obtained and showed large Left Pleural effusion so was referred to ER for further eval.  -review of prior imaging showed small effusion s/p CABG sept 21, this was fairly large during his admit for his lap Cholecystectomy in Nov 21. He was subsequently re-admitted later that month  w/ weakness and right sided hydronephrosis at that time Effusion was much larger so  he had thora & 1.85 liters removed this was exudative. He was d/c w/ very small residual effusion. No films on file until he got admitted this time.  -further questioning specifically about onset of dyspnea:  Onset initially around 6 weeks prior.  This was accompanied by productive cough of copious amounts of clear and at times white sputum.  Also had significant nasal congestion and runny nose.  He denied fever.  Denied sick exposure.  Had some chest discomfort with cough.  Did feel more weak than usual but of note he has never fully returned to his typical activity status after his bypass surgery. Underwent US guided left thoracentesis on 8/16. 1.2 liters Hazy pleural fluid evacuated. LDH: 179, orange, cloudy, nucleated cell ct 4480, glucose 72, cultures pending. 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 LDH: 179, orange, cloudy, nucleated cell ct 4480, glucose 72, cultures pending. PCCM asked to eval for recurrent and complicated left effusion  8/17 Chest tube placed  Interim History / Subjective:  Chest tube has total of 3L output since placement yesterday.   Chest radiograph showing resolution of effusion with left pneumothorax.  Objective   Blood pressure 129/66, pulse 84, temperature 98.5 F (36.9 C), temperature source Oral, resp. rate 18, height 6\' 1"  (1.854 m), weight 90.3 kg, SpO2 98 %.        Intake/Output Summary (Last 24 hours) at 06/25/2021 1035 Last data filed at 06/25/2021 0900 Gross per 24 hour  Intake 263.83 ml  Output 3350 ml  Net -3086.17 ml    Filed Weights   06/22/21 1740 06/23/21 1428 06/25/21 0500  Weight: 95.3 kg 90.4 kg 90.3 kg    Examination: General: no acute distress, resting in bed HEENT: [place]/AT, moist mucous membranes, sclera anicteric, poor dentition Neuro: A&O x 3, moving all extremities CV: rrr, s1s2, no murmurs PULM: mild rales on left. No wheezing. Left chest tube in place, tidaling fluid within tube, no air leak noted. 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 This pleural space appears to have become complicated due to a recent pneumonia with parapneumonic process occurring. He  has drained 3L of fluid since placement of chest tube with resolution of effusion on chest radiograph today. He has left pneumothorax which is likely related to trapped lung since he has had a chronic effusion for 10 months.   Plan Continue to hold anticoagulation  Continue chest tube to -20cmH2O. Ok to travel on water seal. Nurse to flush chest tube with saline q8-12hrs Will check a CT  chest scan to evaluate for pleural thickening and consider treatment with TPA/DNAse. Will also consider CT surgery consult for further evaluation of trapped lung and if he would be a candidate for VATs decortication.  Best Practice (right click and "Reselect all SmartList Selections" daily)   Per primary  Labs   CBC: Recent Labs  Lab 06/22/21 1620 06/23/21 0206 06/24/21 0226 06/25/21 0057  WBC 7.1 7.0 6.9 9.3  NEUTROABS 5.3  --  4.5  --   HGB 10.4* 9.9* 9.3* 10.0*  HCT 35.3* 33.8* 31.1* 32.3*  MCV 85.9 87.1 85.0 83.9  PLT 404* 368 345 363     Basic Metabolic Panel: Recent Labs  Lab 06/22/21 1620 06/23/21 0206 06/24/21 0226 06/25/21 0057  NA 136 137 135 135  K 4.3 4.6 4.0 4.2  CL 101 102 101 99  CO2 25 26 26 28   GLUCOSE 99 93 84 101*  BUN 16 13 12 13   CREATININE 1.06 1.00 1.02 1.07  CALCIUM 8.8* 8.8* 8.6* 8.5*  MG  --   --  2.0  --   PHOS  --   --  2.8  --     GFR: Estimated Creatinine Clearance: 74.7 mL/min (by C-G formula based on SCr of 1.07 mg/dL). Recent Labs  Lab 06/22/21 1620 06/23/21 0206 06/24/21 0226 06/25/21 0057  WBC 7.1 7.0 6.9 9.3     Liver Function Tests: Recent Labs  Lab 06/23/21 0206 06/24/21 0226  AST 13* 13*  ALT 8 8  ALKPHOS 98 90  BILITOT 1.4* 1.5*  PROT 6.6 6.1*  ALBUMIN 2.5* 2.3*    No results for input(s): LIPASE, AMYLASE in the last 168 hours. No results for input(s): AMMONIA in the last 168 hours.  ABG    Component Value Date/Time   PHART 7.327 (L) 07/29/2020 2140   PCO2ART 35.6 07/29/2020 2140   PO2ART 87 07/29/2020 2140   HCO3 18.6 (L) 07/29/2020 2140   TCO2 20 (L) 07/29/2020 2140   ACIDBASEDEF 7.0 (H) 07/29/2020 2140   O2SAT 96.0 07/29/2020 2140      Coagulation Profile: Recent Labs  Lab 06/23/21 0206  INR 1.7*     Cardiac Enzymes: No results for input(s): CKTOTAL, CKMB, CKMBINDEX, TROPONINI in the last 168 hours.  HbA1C: Hgb A1c MFr Bld  Date/Time Value Ref Range Status  07/26/2020 05:15 AM  5.2 4.8 - 5.6 % Final    Comment:    (NOTE) Pre diabetes:          5.7%-6.4%  Diabetes:              >6.4%  Glycemic control for   <7.0% adults with diabetes     CBG: No results for input(s): GLUCAP in the last 168 hours.     Critical care time: NA    06/25/21, MD [facility] Office: [phone]   See Amion for personal pager PCCM on call pager [phone] until 7pm. Please call Elink 7p-7a. [phone]

## 2021-06-25 NOTE — Plan of Care (Signed)
  Problem: Education: Goal: Knowledge of General Education information will improve Description: Including pain rating scale, medication(s)/side effects and non-pharmacologic comfort measures Outcome: Progressing   Problem: Health Behavior/Discharge Planning: Goal: Ability to manage health-related needs will improve Outcome: Progressing   Problem: Clinical Measurements: Goal: Ability to maintain clinical measurements within normal limits will improve Outcome: Progressing Goal: Respiratory complications will improve Outcome: Progressing   Problem: Nutrition: Goal: Adequate nutrition will be maintained Outcome: Progressing   

## 2021-06-26 ENCOUNTER — Inpatient Hospital Stay (HOSPITAL_COMMUNITY): Payer: Medicare Other

## 2021-06-26 DIAGNOSIS — J90 Pleural effusion, not elsewhere classified: Secondary | ICD-10-CM | POA: Diagnosis not present

## 2021-06-26 LAB — BODY FLUID CULTURE W GRAM STAIN: Culture: NO GROWTH

## 2021-06-26 LAB — BASIC METABOLIC PANEL
Anion gap: 7 (ref 5–15)
BUN: 13 mg/dL (ref 8–23)
CO2: 28 mmol/L (ref 22–32)
Calcium: 8.5 mg/dL — ABNORMAL LOW (ref 8.9–10.3)
Chloride: 99 mmol/L (ref 98–111)
Creatinine, Ser: 1 mg/dL (ref 0.61–1.24)
GFR, Estimated: >60 mL/min (ref >60)
Glucose, Bld: 97 mg/dL (ref 70–99)
Potassium: 3.8 mmol/L (ref 3.5–5.1)
Sodium: 134 mmol/L — ABNORMAL LOW (ref 135–145)

## 2021-06-26 LAB — CBC
HCT: 32.2 % — ABNORMAL LOW (ref 39.0–52.0)
Hemoglobin: 9.6 g/dL — ABNORMAL LOW (ref 13.0–17.0)
MCH: 25.3 pg — ABNORMAL LOW (ref 26.0–34.0)
MCHC: 29.8 g/dL — ABNORMAL LOW (ref 30.0–36.0)
MCV: 84.7 fL (ref 80.0–100.0)
Platelets: 387 10*3/uL (ref 150–400)
RBC: 3.8 MIL/uL — ABNORMAL LOW (ref 4.22–5.81)
RDW: 14.3 % (ref 11.5–15.5)
WBC: 6 10*3/uL (ref 4.0–10.5)
nRBC: 0 % (ref 0.0–0.2)

## 2021-06-26 NOTE — Care Management Important Message (Signed)
Important Message  Patient Details  Name: [name] Rice MRN: [id number] Date of Birth: 1951-09-08   Medicare Important Message Given:  Yes     Dorena Bodo 06/26/2021, 3:09 PM

## 2021-06-26 NOTE — Progress Notes (Signed)
PROGRESS NOTE  [name] Rice ZOX:[id number] DOB: 1951/05/08 DOA: 06/22/2021 PCP: [name], PA-C   LOS: 3 days   Brief Narrative / Interim history: [name] Rice is a 70 y.o. male with medical history significant of anemia, A. fib, hypertension, cholecystitis and choledocholithiasis status post cholecystectomy, CKD 3, CAD s/p CABG, gilberts syndrome, hyperlipidemia, left bundle branch block who presents with ongoing shortness of breath.  He was found to have a large L sided effusion.  Admitted for management.  He had a left-sided effusion last year as well in September during his admission for laparoscopic cholecystectomy.  Subjective / 24h Interval events: Feels well, took a few laps in the hallway with physical therapy.  Chest tube is not bothering him too much  Assessment & Plan: Principal Problem Recurrent left-sided pleural effusion-loculated, appears to be complicated.  Exudative in nature.  Status post chest tube placement on 8/17 -Repeat cytology, cultures, further management per pulmonary -CT scan done yesterday with trapped lung, likely suggesting longstanding effusion.  Further management per pulm  Active Problems Acute hypoxic respiratory failure-resolved, on room air this morning  History of A. Fib -continue metoprolol, hold anticoagulation due to chest tube presence  Essential hypertension-continue metoprolol  CAD, status post CABG 2021-no chest pain, stable  Hyperlipidemia-continue statin  Normocytic anemia-of chronic disease   Scheduled Meds:  metoprolol tartrate  12.5 mg Oral Daily   rosuvastatin  20 mg Oral Daily   sodium chloride flush  10 mL Intracatheter Q8H   sodium chloride flush  3 mL Intravenous Q12H   traZODone  50 mg Oral QHS   Continuous Infusions:  cefTRIAXone (ROCEPHIN)  IV 1 g (06/25/21 1713)   metronidazole 500 mg (06/26/21 0546)   PRN Meds:.acetaminophen **OR** acetaminophen, morphine injection, polyethylene glycol  Diet Orders  (From admission, onward)     Start     Ordered   06/22/21 1901  Diet Heart Room service appropriate? Yes; Fluid consistency: Thin  Diet effective now       Question Answer Comment  Room service appropriate? Yes   Fluid consistency: Thin      06/22/21 1902            DVT prophylaxis: Place and maintain sequential compression device Start: 06/23/21 1852     Code Status: Full Code  Family Communication: no family at bedside   Status is: Inpatient  Remains inpatient appropriate because:Inpatient level of care appropriate due to severity of illness  Dispo: The patient is from: Home              Anticipated d/c is to: Home              Patient currently is not medically stable to d/c.   Difficult to place patient No   Level of care: Telemetry Medical  Consultants:  PCCM  Procedures:  none  Microbiology  none  Antimicrobials: none    Objective: Vitals:   06/26/21 0201 06/26/21 0504 06/26/21 0806 06/26/21 0806  BP:  140/71 (!) 148/82 (!) 146/82  Pulse:  77 72 87  Resp:  18  18  Temp:  97.7 F (36.5 C)    TempSrc:      SpO2:  100%  100%  Weight: 90.3 kg     Height:        Intake/Output Summary (Last 24 hours) at 06/26/2021 1335 Last data filed at 06/26/2021 0904 Gross per 24 hour  Intake 332.93 ml  Output 500 ml  Net -167.07 ml  Filed Weights   06/23/21 1428 06/25/21 0500 06/26/21 0201  Weight: 90.4 kg 90.3 kg 90.3 kg    Examination:  Constitutional: NAD Eyes: No icterus ENMT: mmm Neck: normal, supple Respiratory: CTA bilaterally, no wheezing, no crackles Cardiovascular: Regular rate and rhythm, no murmurs, no edema Abdomen: Soft, NT, ND, bowel sounds positive Musculoskeletal: no clubbing / cyanosis.  Skin: No rashes seen  Data Reviewed: I have independently reviewed following labs and imaging studies   CBC: Recent Labs  Lab 06/22/21 1620 06/23/21 0206 06/24/21 0226 06/25/21 0057 06/26/21 0208  WBC 7.1 7.0 6.9 9.3 6.0  NEUTROABS  5.3  --  4.5  --   --   HGB 10.4* 9.9* 9.3* 10.0* 9.6*  HCT 35.3* 33.8* 31.1* 32.3* 32.2*  MCV 85.9 87.1 85.0 83.9 84.7  PLT 404* 368 345 363 387    Basic Metabolic Panel: Recent Labs  Lab 06/22/21 1620 06/23/21 0206 06/24/21 0226 06/25/21 0057 06/26/21 0208  NA 136 137 135 135 134*  K 4.3 4.6 4.0 4.2 3.8  CL 101 102 101 99 99  CO2 25 26 26 28 28   GLUCOSE 99 93 84 101* 97  BUN 16 13 12 13 13   CREATININE 1.06 1.00 1.02 1.07 1.00  CALCIUM 8.8* 8.8* 8.6* 8.5* 8.5*  MG  --   --  2.0  --   --   PHOS  --   --  2.8  --   --     Liver Function Tests: Recent Labs  Lab 06/23/21 0206 06/24/21 0226  AST 13* 13*  ALT 8 8  ALKPHOS 98 90  BILITOT 1.4* 1.5*  PROT 6.6 6.1*  ALBUMIN 2.5* 2.3*    Coagulation Profile: Recent Labs  Lab 06/23/21 0206  INR 1.7*    HbA1C: No results for input(s): HGBA1C in the last 72 hours. CBG: No results for input(s): GLUCAP in the last 168 hours.  Recent Results (from the past 240 hour(s))  Resp Panel by RT-PCR (Flu A&B, Covid) Nasopharyngeal Swab     Status: None   Collection Time: 06/22/21  5:35 PM   Specimen: Nasopharyngeal Swab; Nasopharyngeal(NP) swabs in vial transport medium  Result Value Ref Range Status   SARS Coronavirus 2 by RT PCR NEGATIVE NEGATIVE Final    Comment: (NOTE) SARS-CoV-2 target nucleic acids are NOT DETECTED.  The SARS-CoV-2 RNA is generally detectable in upper respiratory specimens during the acute phase of infection. The lowest concentration of SARS-CoV-2 viral copies this assay can detect is 138 copies/mL. A negative result does not preclude SARS-Cov-2 infection and should not be used as the sole basis for treatment or other patient management decisions. A negative result may occur with  improper specimen collection/handling, submission of specimen other than nasopharyngeal swab, presence of viral mutation(s) within the areas targeted by this assay, and inadequate number of viral copies(<138 copies/mL). A  negative result must be combined with clinical observations, patient history, and epidemiological information. The expected result is Negative.  Fact Sheet for Patients:  06/25/21  Fact Sheet for Healthcare Providers:  06/24/21  This test is no t yet approved or cleared by the [employer] FDA and  has been authorized for detection and/or diagnosis of SARS-CoV-2 by FDA under an Emergency Use Authorization (EUA). This EUA will remain  in effect (meaning this test can be used) for the duration of the COVID-19 declaration under Section 564(b)(1) of the Act, 21 U.S.C.section 360bbb-3(b)(1), unless the authorization is terminated  or revoked sooner.  Influenza A by PCR NEGATIVE NEGATIVE Final   Influenza B by PCR NEGATIVE NEGATIVE Final    Comment: (NOTE) The Xpert Xpress SARS-CoV-2/FLU/RSV plus assay is intended as an aid in the diagnosis of influenza from Nasopharyngeal swab specimens and should not be used as a sole basis for treatment. Nasal washings and aspirates are unacceptable for Xpert Xpress SARS-CoV-2/FLU/RSV testing.  Fact Sheet for Patients: [employer]  Fact Sheet for Healthcare Providers: [employer]ker[employer]it  This test is not yet approved or cleared by the [place] FDA and has been authorized for detection and/or diagnosis of SARS-CoV-2 by FDA under an Emergency Use Authorization (EUA). This EUA will remain in effect (meaning this test can be used) for the duration of the COVID-19 declaration under Section 564(b)(1) of the Act, 21 U.S.C. section 360bbb-3(b)(1), unless the authorization is terminated or revoked.  Performed at [facility], [address], [place], [place] [address]   Body fluid culture w Gram Stain     Status: None   Collection Time: 06/23/21 11:10 AM   Specimen: Lung, Left; Pleural Fluid  Result Value Ref  Range Status   Specimen Description PLEURAL FLUID  Final   Special Requests LEFT LUNG  Final   Gram Stain   Final    MODERATE WBC PRESENT, PREDOMINANTLY MONONUCLEAR NO ORGANISMS SEEN    Culture   Final    NO GROWTH Performed at [facility], [address] [place], [place] [address]    Report Status 06/26/2021 FINAL  Final      Radiology Studies: CT CHEST WO CONTRAST  Result Date: 06/25/2021 CLINICAL DATA:  Pneumothorax concern for trapped lung, pleural thickening due to chronic left pleural effusion EXAM: CT CHEST WITHOUT CONTRAST TECHNIQUE: Multidetector CT imaging of the chest was performed following the standard protocol without IV contrast. COMPARISON:  Radiograph 06/25/2021 FINDINGS: Cardiovascular: Normal cardiac size.Prior CABG. No pericardial disease.Mildly enlarged branch pulmonary arteries.Calcified aortic atherosclerotic calcifications. Mediastinum/Nodes: Multiple prominent mediastinal lymph nodes, similar to prior exam. Unchanged 1.3 cm hypodense right thyroid nodule. Lungs/Pleura: There is a moderate size left-sided hydropneumothorax with multiple locules of gas within fluid in the lung base. Volume loss in the left lung. Thickened visceral pleura of the left lung. There is a left basilar chest tube in place within the pleural space. Multiple calcified granulomas in the left upper lobe. The right lung is clear. Airways are patent. Upper Abdomen: Pneumobilia.  Small hiatal hernia. Musculoskeletal: No acute osseous abnormality.No suspicious lytic or blastic lesions. IMPRESSION: Left-sided hydropneumothorax with parenchymal volume loss and thickened visceral pleura, compatible with a trapped lung. Left basilar chest tube is in place. Multiple prominent mediastinal lymph nodes, similar prior exam and likely reactive. Electronically Signed   By: Caprice Renshaw M.D.   On: 06/25/2021 18:36     Pamella Pert, MD, PhD [facility]  Between 7 am - 7 pm I am available, please  contact me via Amion (for emergencies) or Securechat (non urgent messages)  Between 7 pm - 7 am I am not available, please contact night coverage MD/APP via Amion

## 2021-06-26 NOTE — Consult Note (Signed)
NAME:  [name] Rice, MRN:  [id number], DOB:  1951/04/14, LOS: 3 ADMISSION DATE:  06/22/2021, CONSULTATION DATE:  8/17 REFERRING MD:  Elvera Lennox, CHIEF COMPLAINT:  recurrent, complex left Pleural effusion   History of Present Illness:  70 year old male. Presented  8/15 w/ cc: 6 week h/o cough and SOB. Went to Urgent Care where a CXR was obtained and showed large Left Pleural effusion so was referred to ER for further eval.  -review of prior imaging showed small effusion s/p CABG sept 21, this was fairly large during his admit for his lap Cholecystectomy in Nov 21. He was subsequently re-admitted later that month  w/ weakness and right sided hydronephrosis at that time Effusion was much larger so  he had thora & 1.85 liters removed this was exudative. He was d/c w/ very small residual effusion. No films on file until he got admitted this time.  -further questioning specifically about onset of dyspnea:  Onset initially around 6 weeks prior.  This was accompanied by productive cough of copious amounts of clear and at times white sputum.  Also had significant nasal congestion and runny nose.  He denied fever.  Denied sick exposure.  Had some chest discomfort with cough.  Did feel more weak than usual but of note he has never fully returned to his typical activity status after his bypass surgery. Underwent US guided left thoracentesis on 8/16. 1.2 liters Hazy pleural fluid evacuated. LDH: 179, orange, cloudy, nucleated cell ct 4480, glucose 72, cultures pending. 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 LDH: 179, orange, cloudy, nucleated cell ct 4480, glucose 72, cultures pending. PCCM asked to eval for recurrent and complicated left effusion  8/17 Chest tube placed 8/18 3L output 8/19 650cc in atrium   Interim History / Subjective:  Chest tube output with 650cc in atrium. Reports 200-300 cc output since this shift. Reports improved dyspnea after chest tube placement  Objective   Blood pressure (!) 111/52, pulse 80, temperature 98.1 F (36.7 C), resp. rate 18, height 6\' 1"  (1.854 m), weight 90.3 kg, SpO2 98 %.        Intake/Output Summary (Last 24 hours) at 06/26/2021 1630 Last data filed at 06/26/2021 0904 Gross per 24 hour  Intake 232.93 ml  Output 500 ml  Net -267.07 ml   Filed Weights   06/23/21 1428 06/25/21 0500 06/26/21 0201  Weight: 90.4 kg 90.3 kg 90.3 kg   Physical Exam: General: Well-appearing, no acute distress HENT: [place], AT, OP clear, MMM Eyes: EOMI, no scleral icterus Respiratory: Diminished breath sounds left >R.  No crackles, wheezing or rales. Left chest tube in place, no air leak in change, tidaling Cardiovascular: RRR, -M/R/G, no JVD GI: BS+, soft, nontender Extremities:-Edema,-tenderness Neuro: AAO x4, CNII-XII grossly intact Psych: Normal mood, normal affect  CT Chest 8/18 with left hydropneumothorax and thickened visceral pleural. 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 This pleural space appears to have become complicated due to a recent pneumonia with parapneumonic process occurring. He has drained 3L of fluid since placement of chest tube and reports symptomatic improvement with fluid removal He has left pneumothorax which is likely related to trapped lung since he has had a chronic  effusion for 10 months.    Chest tube output ~200-300cc/24 hours  Plan Continue to hold anticoagulation  Place chest tube on water seal Nurse to flush chest tube with saline q8-12hrs CXR in am Consult CT surgery consult for further evaluation of trapped lung and if he would be a candidate for VATs decortication.  Best Practice (right click and "Reselect all SmartList Selections" daily)   Per primary   Critical care time: NA   Care Time 35 min  Mechele Collin, M.D. [facility] Pulmonary/Critical Care Medicine 06/26/2021 4:30 PM   Please see Amion for pager number to reach on-call Pulmonary and Critical Care Team.

## 2021-06-26 NOTE — Progress Notes (Signed)
Physical Therapy Treatment Patient Details Name: [name] Rice MRN: [id number] DOB: [birth year] Today's Date: 06/26/2021    History of Present Illness 70 y.o. male presents to [facility] hospital on 06/22/2021 with ongoing SOB. Pt found to have large L sided pleural effusion. Pt underwent thoracentesis on 8/16. PMH includes anemia, A. fib, hypertension, cholecystitis status post cholecystectomy, CKD 3, CAD, Gill Bears syndrome, hyperlipidemia, left bundle branch block. Had chest tube inserted 8/17.    PT Comments    Patient progressing well despite limited mobility since chest tube insertion.  Encouraged to ask nursing for assist with hallway ambulation despite chest tube and IV.  Patient still appropriate for home, but would like some intermittent help initially.  PT to continue to follow.    Follow Up Recommendations  No PT follow up     Equipment Recommendations  None recommended by PT    Recommendations for Other Services       Precautions / Restrictions Precautions Precautions: Fall Precaution Comments: chest tube    Mobility  Bed Mobility Overal bed mobility: Modified Independent                  Transfers Overall transfer level: Needs assistance   Transfers: Sit to/from Stand Sit to Stand: Min guard         General transfer comment: increased time and effort to rise. reports not OOB since chest tube in  Ambulation/Gait Ambulation/Gait assistance: Min guard;Supervision Gait Distance (Feet): 250 Feet   Gait Pattern/deviations: Step-through pattern;Decreased stride length     General Gait Details: steady with slow pace, reports breathing much improved after fluid off his lung   Stairs             Wheelchair Mobility    Modified Rankin (Stroke Patients Only)       Balance Overall balance assessment: Needs assistance Sitting-balance support: Feet supported Sitting balance-Leahy Scale: Normal     Standing balance support: No upper extremity  supported Standing balance-Leahy Scale: Good Standing balance comment: some mild deficts noted                            Cognition Arousal/Alertness: Awake/alert Behavior During Therapy: WFL for tasks assessed/performed Overall Cognitive Status: Within Functional Limits for tasks assessed                                        Exercises      General Comments General comments (skin integrity, edema, etc.): Encouraged to ask nursing for assist with hallway ambulation      Pertinent Vitals/Pain Pain Score: 4  Pain Location: L side at chest tube insertion Pain Descriptors / Indicators: Sore Pain Intervention(s): Monitored during session    Home Living                      Prior Function            PT Goals (current goals can now be found in the care plan section) Progress towards PT goals: Progressing toward goals    Frequency           PT Plan Current plan remains appropriate    Co-evaluation              AM-PAC PT "6 Clicks" Mobility   Outcome Measure  Help needed turning from your back to your  side while in a flat bed without using bedrails?: None Help needed moving from lying on your back to sitting on the side of a flat bed without using bedrails?: None Help needed moving to and from a bed to a chair (including a wheelchair)?: A Little Help needed standing up from a chair using your arms (e.g., wheelchair or bedside chair)?: A Little Help needed to walk in hospital room?: A Little Help needed climbing 3-5 steps with a railing? : A Little 6 Click Score: 20    End of Session   Activity Tolerance: Patient tolerated treatment well Patient left: in chair;with call bell/phone within reach   PT Visit Diagnosis: Other abnormalities of gait and mobility (R26.89)     Time: 4680-3212 PT Time Calculation (min) (ACUTE ONLY): 21 min  Charges:  $Gait Training: 8-22 mins                     [name] Rice, PT Acute  Rehabilitation Services Pager:[phone] Office:[phone] 06/26/2021    [name] Rice 06/26/2021, 9:44 AM

## 2021-06-27 ENCOUNTER — Inpatient Hospital Stay (HOSPITAL_COMMUNITY): Payer: Medicare Other

## 2021-06-27 DIAGNOSIS — J9819 Other pulmonary collapse: Secondary | ICD-10-CM

## 2021-06-27 DIAGNOSIS — J90 Pleural effusion, not elsewhere classified: Secondary | ICD-10-CM

## 2021-06-27 DIAGNOSIS — Z7901 Long term (current) use of anticoagulants: Secondary | ICD-10-CM

## 2021-06-27 LAB — CBC
HCT: 29.5 % — ABNORMAL LOW (ref 39.0–52.0)
Hemoglobin: 9.1 g/dL — ABNORMAL LOW (ref 13.0–17.0)
MCH: 25.7 pg — ABNORMAL LOW (ref 26.0–34.0)
MCHC: 30.8 g/dL (ref 30.0–36.0)
MCV: 83.3 fL (ref 80.0–100.0)
Platelets: 352 10*3/uL (ref 150–400)
RBC: 3.54 MIL/uL — ABNORMAL LOW (ref 4.22–5.81)
RDW: 14.3 % (ref 11.5–15.5)
WBC: 6.4 10*3/uL (ref 4.0–10.5)
nRBC: 0 % (ref 0.0–0.2)

## 2021-06-27 LAB — COMPREHENSIVE METABOLIC PANEL
ALT: 8 U/L (ref 0–44)
AST: 13 U/L — ABNORMAL LOW (ref 15–41)
Albumin: 2 g/dL — ABNORMAL LOW (ref 3.5–5.0)
Alkaline Phosphatase: 88 U/L (ref 38–126)
Anion gap: 6 (ref 5–15)
BUN: 13 mg/dL (ref 8–23)
CO2: 27 mmol/L (ref 22–32)
Calcium: 8.2 mg/dL — ABNORMAL LOW (ref 8.9–10.3)
Chloride: 99 mmol/L (ref 98–111)
Creatinine, Ser: 0.97 mg/dL (ref 0.61–1.24)
GFR, Estimated: >60 mL/min (ref >60)
Glucose, Bld: 95 mg/dL (ref 70–99)
Potassium: 3.8 mmol/L (ref 3.5–5.1)
Sodium: 132 mmol/L — ABNORMAL LOW (ref 135–145)
Total Bilirubin: 0.8 mg/dL (ref 0.3–1.2)
Total Protein: 5.8 g/dL — ABNORMAL LOW (ref 6.5–8.1)

## 2021-06-27 NOTE — Consult Note (Signed)
NAME:  [name] Rice, MRN:  [id number], DOB:  [birth year], LOS: 4 ADMISSION DATE:  06/22/2021, CONSULTATION DATE:  8/17 REFERRING MD:  Elvera Lennox, CHIEF COMPLAINT:  recurrent, complex left Pleural effusion   History of Present Illness:  70 year old male. Presented  8/15 w/ cc: 6 week h/o cough and SOB. Went to Urgent Care where a CXR was obtained and showed large Left Pleural effusion so was referred to ER for further eval.  -review of prior imaging showed small effusion s/p CABG sept 21, this was fairly large during his admit for his lap Cholecystectomy in Nov 21. He was subsequently re-admitted later that month  w/ weakness and right sided hydronephrosis at that time Effusion was much larger so  he had thora & 1.85 liters removed this was exudative. He was d/c w/ very small residual effusion. No films on file until he got admitted this time.  -further questioning specifically about onset of dyspnea:  Onset initially around 6 weeks prior.  This was accompanied by productive cough of copious amounts of clear and at times white sputum.  Also had significant nasal congestion and runny nose.  He denied fever.  Denied sick exposure.  Had some chest discomfort with cough.  Did feel more weak than usual but of note he has never fully returned to his typical activity status after his bypass surgery. Underwent US guided left thoracentesis on 8/16. 1.2 liters Hazy pleural fluid evacuated. LDH: 179, orange, cloudy, nucleated cell ct 4480, glucose 72, cultures pending. 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 LDH: 179, orange, cloudy, nucleated cell ct 4480, glucose 72, cultures pending. PCCM asked to eval for recurrent and complicated left effusion  8/17 Chest tube placed 8/18 3L output 8/19 650cc in atrium. Placed on WS 8/20 750cc in the atrium. Chest tube output ~100cc   Interim History / Subjective:  Chest tube output decreased to ~100cc/hr on my exam. CT surgery discussed decortication with patient.  Denies shortness of breath  Objective   Blood pressure 122/60, pulse 68, temperature 98 F (36.7 C), temperature source Oral, resp. rate (!) 25, height 6\' 1"  (1.854 m), weight 90.7 kg, SpO2 97 %.        Intake/Output Summary (Last 24 hours) at 06/27/2021 1818 Last data filed at 06/27/2021 0437 Gross per 24 hour  Intake 10 ml  Output 1440 ml  Net -1430 ml   Filed Weights   06/25/21 0500 06/26/21 0201 06/27/21 0435  Weight: 90.3 kg 90.3 kg 90.7 kg   Physical Exam: General: Chronically ill-appearing, no acute distress HENT: [place], AT, OP clear, MMM Eyes: EOMI, no scleral icterus Respiratory: Diminished breath bilaterally L>R.  No crackles, wheezing or rales. Left chest tube in place, no air leak, tidaling Cardiovascular: RRR, -M/R/G, no JVD Extremities:-Edema,-tenderness Neuro: AAO x4, CNII-XII grossly intact  CT Chest 8/18 with left hydropneumothorax and thickened visceral pleural. Left basilar chest tube in place CXR 8/19 Small left-sided hydropneumothorax, left basilar atelectasis  Resolved Hospital Problem list     Assessment & Plan:  Left hydropneumothorax Trapped lung Complicated/exudative left pleural  effusion Concern for trapped lung - Left Patient has chronic left pleural effusion since his CABG surgery in 09/2020. This pleural space appears to have become complicated due to a recent pneumonia with parapneumonic process occurring. He has drained 3-4L of fluid since placement of chest tube and reports symptomatic improvement with fluid removal He  has left pneumothorax which is likely related to trapped lung since he has had a chronic effusion for 10 months.    Chest tube output ~100cc/24 hours since I was at bedside. (650>750 mark)  Plan Continue to hold anticoagulation  Continue chest tube on water seal Nurse to flush chest tube with saline q8-12hrs CXR in am Discussed plan with CT surgery.  Plan for decortication this week pending schedule. If patient stable prior to this, ok to discharge and arrange for outpatient procedure. For now, he remains inpatient due to chest tube  Best Practice (right click and "Reselect all SmartList Selections" daily)   Per primary   Critical care time: NA   Care Time 36 min  Mechele Collin, M.D. [facility] Pulmonary/Critical Care Medicine 06/27/2021 6:19 PM   See Amion for personal pager For hours between 7 PM to 7 AM, please call Elink for urgent questions

## 2021-06-27 NOTE — Progress Notes (Signed)
PROGRESS NOTE  [name] Rice BWI:[id number] DOB: July 05, 1951 DOA: 06/22/2021 PCP: [name], PA-C   LOS: 4 days   Brief Narrative / Interim history: [name] Rice is a 70 y.o. male with medical history significant of anemia, A. fib, hypertension, cholecystitis and choledocholithiasis status post cholecystectomy, CKD 3, CAD s/p CABG, gilberts syndrome, hyperlipidemia, left bundle branch block who presents with ongoing shortness of breath.  He was found to have a large L sided effusion.  Admitted for management.  He had a left-sided effusion last year as well in September during his admission for laparoscopic cholecystectomy.  Subjective / 24h Interval events: Continues to feel well this morning, he denies significant shortness of breath  Assessment & Plan: Principal Problem Recurrent left-sided pleural effusion-loculated, appears to be complicated.  Exudative in nature.  Status post chest tube placement on 8/17 -Repeat cytology, cultures, further management per pulmonary -CT scan done yesterday with trapped lung, likely suggesting longstanding effusion. -Thoracic surgery consulted, plan for robotic decortication next week, timing to be determined  Active Problems Acute hypoxic respiratory failure-resolved, he remains on room air  History of A. Fib -continue metoprolol, hold anticoagulation given current plans  Essential hypertension-continue metoprolol  CAD, status post CABG 2021-no chest pain, stable  Hyperlipidemia-continue statin  Normocytic anemia-of chronic disease   Scheduled Meds:  metoprolol tartrate  12.5 mg Oral Daily   rosuvastatin  20 mg Oral Daily   sodium chloride flush  10 mL Intracatheter Q8H   sodium chloride flush  3 mL Intravenous Q12H   traZODone  50 mg Oral QHS   Continuous Infusions:  cefTRIAXone (ROCEPHIN)  IV 1 g (06/26/21 1722)   metronidazole 500 mg (06/27/21 0539)   PRN Meds:.acetaminophen **OR** acetaminophen, morphine injection,  polyethylene glycol  Diet Orders (From admission, onward)     Start     Ordered   06/22/21 1901  Diet Heart Room service appropriate? Yes; Fluid consistency: Thin  Diet effective now       Question Answer Comment  Room service appropriate? Yes   Fluid consistency: Thin      06/22/21 1902            DVT prophylaxis: Place and maintain sequential compression device Start: 06/23/21 1852     Code Status: Full Code  Family Communication: no family at bedside   Status is: Inpatient  Remains inpatient appropriate because:Inpatient level of care appropriate due to severity of illness  Dispo: The patient is from: Home              Anticipated d/c is to: Home              Patient currently is not medically stable to d/c.   Difficult to place patient No   Level of care: Telemetry Medical  Consultants:  PCCM  Procedures:  none  Microbiology  none  Antimicrobials: none    Objective: Vitals:   06/26/21 1537 06/26/21 2100 06/27/21 0435 06/27/21 0751  BP: (!) 111/52 109/63 126/64 109/88  Pulse: 80 64 69 66  Resp:    (!) 25  Temp: 98.1 F (36.7 C) 98.4 F (36.9 C) 97.8 F (36.6 C)   TempSrc:  Oral Oral   SpO2: 98% 95% 97% 99%  Weight:   90.7 kg   Height:        Intake/Output Summary (Last 24 hours) at 06/27/2021 1159 Last data filed at 06/27/2021 0437 Gross per 24 hour  Intake 10 ml  Output 1440 ml  Net -1430  ml    Filed Weights   06/25/21 0500 06/26/21 0201 06/27/21 0435  Weight: 90.3 kg 90.3 kg 90.7 kg    Examination:  Constitutional: Is in no distress Eyes no scleral icterus ENMT: mmm Neck: normal, supple Respiratory: cta without wheezing, no crackles Cardiovascular: Regular rate and rhythm, no murmurs, no peripheral edema Abdomen: Soft, nontender, nondistended, bowel sounds positive Musculoskeletal: no clubbing / cyanosis.  Skin: No rashes seen  Data Reviewed: I have independently reviewed following labs and imaging studies   CBC: Recent  Labs  Lab 07/19/21 1620 06/23/21 0206 06/24/21 0226 06/25/21 0057 06/26/21 0208 06/27/21 0122  WBC 7.1 7.0 6.9 9.3 6.0 6.4  NEUTROABS 5.3  --  4.5  --   --   --   HGB 10.4* 9.9* 9.3* 10.0* 9.6* 9.1*  HCT 35.3* 33.8* 31.1* 32.3* 32.2* 29.5*  MCV 85.9 87.1 85.0 83.9 84.7 83.3  PLT 404* 368 345 363 387 352    Basic Metabolic Panel: Recent Labs  Lab 06/23/21 0206 06/24/21 0226 06/25/21 0057 06/26/21 0208 06/27/21 0122  NA 137 135 135 134* 132*  K 4.6 4.0 4.2 3.8 3.8  CL 102 101 99 99 99  CO2 26 26 28 28 27   GLUCOSE 93 84 101* 97 95  BUN 13 12 13 13 13   CREATININE 1.00 1.02 1.07 1.00 0.97  CALCIUM 8.8* 8.6* 8.5* 8.5* 8.2*  MG  --  2.0  --   --   --   PHOS  --  2.8  --   --   --     Liver Function Tests: Recent Labs  Lab 06/23/21 0206 06/24/21 0226 06/27/21 0122  AST 13* 13* 13*  ALT 8 8 8   ALKPHOS 98 90 88  BILITOT 1.4* 1.5* 0.8  PROT 6.6 6.1* 5.8*  ALBUMIN 2.5* 2.3* 2.0*    Coagulation Profile: Recent Labs  Lab 06/23/21 0206  INR 1.7*    HbA1C: No results for input(s): HGBA1C in the last 72 hours. CBG: No results for input(s): GLUCAP in the last 168 hours.  Recent Results (from the past 240 hour(s))  Resp Panel by RT-PCR (Flu A&B, Covid) Nasopharyngeal Swab     Status: None   Collection Time: 2021/07/19  5:35 PM   Specimen: Nasopharyngeal Swab; Nasopharyngeal(NP) swabs in vial transport medium  Result Value Ref Range Status   SARS Coronavirus 2 by RT PCR NEGATIVE NEGATIVE Final    Comment: (NOTE) SARS-CoV-2 target nucleic acids are NOT DETECTED.  The SARS-CoV-2 RNA is generally detectable in upper respiratory specimens during the acute phase of infection. The lowest concentration of SARS-CoV-2 viral copies this assay can detect is 138 copies/mL. A negative result does not preclude SARS-Cov-2 infection and should not be used as the sole basis for treatment or other patient management decisions. A negative result may occur with  improper specimen  collection/handling, submission of specimen other than nasopharyngeal swab, presence of viral mutation(s) within the areas targeted by this assay, and inadequate number of viral copies(<138 copies/mL). A negative result must be combined with clinical observations, patient history, and epidemiological information. The expected result is Negative.  Fact Sheet for Patients:   Fact Sheet for Healthcare Providers:  06/25/21  This test is no t yet approved or cleared by the 06/24/21 FDA and  has been authorized for detection and/or diagnosis of SARS-CoV-2 by FDA under an Emergency Use Authorization (EUA). This EUA will remain  in effect (meaning this test can be used) for the duration  of the COVID-19 declaration under Section 564(b)(1) of the Act, 21 U.S.C.section 360bbb-3(b)(1), unless the authorization is terminated  or revoked sooner.       Influenza A by PCR NEGATIVE NEGATIVE Final   Influenza B by PCR NEGATIVE NEGATIVE Final    Comment: (NOTE) The Xpert Xpress SARS-CoV-2/FLU/RSV plus assay is intended as an aid in the diagnosis of influenza from Nasopharyngeal swab specimens and should not be used as a sole basis for treatment. Nasal washings and aspirates are unacceptable for Xpert Xpress SARS-CoV-2/FLU/RSV testing.  Fact Sheet for Patients: BloggerCourse.com  Fact Sheet for Healthcare Providers: SeriousBroker.it  This test is not yet approved or cleared by the [place] FDA and has been authorized for detection and/or diagnosis of SARS-CoV-2 by FDA under an Emergency Use Authorization (EUA). This EUA will remain in effect (meaning this test can be used) for the duration of the COVID-19 declaration under Section 564(b)(1) of the Act, 21 U.S.C. section 360bbb-3(b)(1), unless the authorization is terminated or revoked.  Performed at [facility], [address], [place], [place] [address]   Body fluid culture w Gram Stain     Status: None   Collection Time: 06/23/21 11:10 AM   Specimen: Lung, Left; Pleural Fluid  Result Value Ref Range Status   Specimen Description PLEURAL FLUID  Final   Special Requests LEFT LUNG  Final   Gram Stain   Final    MODERATE WBC PRESENT, PREDOMINANTLY MONONUCLEAR NO ORGANISMS SEEN    Culture   Final    NO GROWTH Performed at [facility], [address] [place], [place] [address]    Report Status 06/26/2021 FINAL  Final      Radiology Studies: DG Chest 1 View  Result Date: 06/26/2021 CLINICAL DATA:  Left-sided pneumothorax EXAM: CHEST  1 VIEW COMPARISON:  06/25/2021 FINDINGS: Single frontal view of the chest demonstrates persistent left-sided hydropneumothorax without significant change since prior exam. Stable pigtail drainage catheter coiled over the left lower hemithorax. Increased density at the left lung base consistent with areas of lung consolidation and pleural fluid seen on preceding CT. Right chest is clear. Postsurgical changes from median sternotomy again noted. The cardiac silhouette is stable. IMPRESSION: 1. Stable left-sided pigtail drainage catheter, with stable persistent small left hydropneumothorax as above. Electronically Signed   By: Sharlet Salina M.D.   On: 06/26/2021 15:07   DG CHEST PORT 1 VIEW  Result Date: 06/27/2021 CLINICAL DATA:  Follow-up left-sided pneumothorax EXAM: PORTABLE CHEST 1 VIEW COMPARISON:  1 day prior FINDINGS: Pigtail catheter within the lower left chest, presumably pleural in position, similar. Patient rotated left. Mild cardiomegaly. Left atrial appendage occlusion device. Prior median sternotomy. Left-sided hydropneumothorax again identified. Slightly increased, with the visceral pleural line 3.9 cm from the apical chest wall today versus 2.6 cm on the prior exam. No congestive failure. Clear right lung. Persistent left base  atelectasis. IMPRESSION: Small left-sided hydropneumothorax, minimally increased. Persistent left base atelectasis. Electronically Signed   By: Jeronimo Greaves M.D.   On: 06/27/2021 08:08     Pamella Pert, MD, PhD [facility]  Between 7 am - 7 pm I am available, please contact me via Amion (for emergencies) or Securechat (non urgent messages)  Between 7 pm - 7 am I am not available, please contact night coverage MD/APP via Amion

## 2021-06-27 NOTE — Consult Note (Signed)
Reason for Consult: Trapped left lung Referring Physician: Dr. Lenis Rice is an 70 y.o. male.  HPI: 70 year old man who presented with shortness of breath.  [name] Rice is a 70 year old man with a past medical history significant for coronary artery disease, atrial fibrillation, CABG, maze, and left atrial appendage clipping in September 2021, left bundle branch block, hypertension, carotid disease, and cholecystectomy in November 2021.  [name] had coronary bypass grafting in September 2021 (Dr. Vickey Sages).  He also had a Maze procedure and left atrial appendage clipping at that time.  He did have a postoperative left pleural effusion.  He was readmitted in November 2021 for cholecystectomy.  He had a large left pleural effusion and had almost 2 L of fluid drained.  No further follow-up with the pleural effusion was noted after that.  He presented with about a 6-week history of progressive cough, shortness of breath, and general malaise.  Chest x-ray showed near complete white out of the left chest.  An ultrasound-guided thoracentesis on 8/16 drained 1.2 L of hazy fluid.  The LDH was elevated at 179.  A pigtail catheter was placed on 06/24/2021.  He drained 3 L over the first 24 hours and then another 650 mL yesterday.  His breathing has improved significantly since the fluid was drained.  Chest x-ray showed incomplete reexpansion with a pneumo ex vacuo.  He denies any fevers or chills.  He was started on antibiotics empirically.  Cultures of the pleural fluid are no growth to date.  He has not been having any chest pain, pressure, or tightness.  He is on Xarelto chronically.  That has been held since admission.  Past Medical History:  Diagnosis Date   Atrial fibrillation (HCC)    Carotid artery stenosis    Coronary artery disease    Dysrhythmia    Hypertension    LBBB (left bundle branch block)     Past Surgical History:  Procedure Laterality Date   CARDIOVERSION N/A  09/28/2016   Procedure: CARDIOVERSION;  Surgeon: Yates Decamp, MD;  Location: [facility] ENDOSCOPY;  Service: Cardiovascular;  Laterality: N/A;   CATARACT EXTRACTION, BILATERAL     CHEST TUBE INSERTION N/A 06/24/2021   Procedure: CHEST TUBE INSERTION;  Surgeon: Martina Sinner, MD;  Location: [facility] ENDOSCOPY;  Service: Pulmonary;  Laterality: N/A;   CHOLECYSTECTOMY N/A 09/18/2020   Procedure: LAPAROSCOPIC PARTIAL CHOLECYSTECTOMY;  Surgeon: Ovidio Kin, MD;  Location: [facility] ORS;  Service: General;  Laterality: N/A;   CLIPPING OF ATRIAL APPENDAGE Left 07/29/2020   Procedure: CLIPPING OF ATRIAL APPENDAGE;  Surgeon: Linden Dolin, MD;  Location: [facility] OR;  Service: Open Heart Surgery;  Laterality: Left;   CORONARY ARTERY BYPASS GRAFT N/A 07/29/2020   Procedure: CORONARY ARTERY BYPASS GRAFTING (CABG) TIMES FOUR USING BILATERAL INTERNAL MAMMARIES AND LEFT RADIAL ARTERY;  Surgeon: Linden Dolin, MD;  Location: [facility] OR;  Service: Open Heart Surgery;  Laterality: N/A;   ERCP N/A 09/17/2020   Procedure: ENDOSCOPIC RETROGRADE CHOLANGIOPANCREATOGRAPHY (ERCP);  Surgeon: Meryl Dare, MD;  Location: [facility] ENDOSCOPY;  Service: Endoscopy;  Laterality: N/A;   IR THORACENTESIS ASP PLEURAL SPACE W/IMG GUIDE  06/23/2021   LEFT HEART CATH AND CORONARY ANGIOGRAPHY N/A 07/25/2020   Procedure: LEFT HEART CATH AND CORONARY ANGIOGRAPHY;  Surgeon: Yates Decamp, MD;  Location: [facility] INVASIVE CV LAB;  Service: Cardiovascular;  Laterality: N/A;   MAZE N/A 07/29/2020   Procedure: MAZE;  Surgeon: Linden Dolin, MD;  Location: [facility] OR;  Service: Open Heart  Surgery;  Laterality: N/A;   RADIAL ARTERY HARVEST Left 07/29/2020   Procedure: RADIAL ARTERY HARVEST;  Surgeon: Linden Dolin, MD;  Location: [facility] OR;  Service: Open Heart Surgery;  Laterality: Left;   REMOVAL OF STONES  09/17/2020   Procedure: REMOVAL OF STONES;  Surgeon: Meryl Dare, MD;  Location: [facility] ENDOSCOPY;  Service: Endoscopy;;   SPHINCTEROTOMY  09/17/2020   Procedure:  Dennison Mascot;  Surgeon: Meryl Dare, MD;  Location: [facility] ENDOSCOPY;  Service: Endoscopy;;   TEE WITHOUT CARDIOVERSION N/A 07/29/2020   Procedure: TRANSESOPHAGEAL ECHOCARDIOGRAM (TEE);  Surgeon: Linden Dolin, MD;  Location: [facility] OR;  Service: Open Heart Surgery;  Laterality: N/A;    Family History  Problem Relation Age of Onset   Heart disease Mother    Hyperlipidemia Mother    Heart disease Father    Hypertension Other     Social History:  reports that he has never smoked. He has never used smokeless tobacco. He reports that he does not drink alcohol and does not use drugs.  Allergies: No Known Allergies  Medications: Scheduled:  metoprolol tartrate  12.5 mg Oral Daily   rosuvastatin  20 mg Oral Daily   sodium chloride flush  10 mL Intracatheter Q8H   sodium chloride flush  3 mL Intravenous Q12H   traZODone  50 mg Oral QHS    Results for orders placed or performed during the hospital encounter of 06/22/21 (from the past 48 hour(s))  Basic metabolic panel     Status: Abnormal   Collection Time: 06/26/21  2:08 AM  Result Value Ref Range   Sodium 134 (L) 135 - 145 mmol/L   Potassium 3.8 3.5 - 5.1 mmol/L   Chloride 99 98 - 111 mmol/L   CO2 28 22 - 32 mmol/L   Glucose, Bld 97 70 - 99 mg/dL    Comment: Glucose reference range applies only to samples taken after fasting for at least 8 hours.   BUN 13 8 - 23 mg/dL   Creatinine, Ser 7.32 0.61 - 1.24 mg/dL   Calcium 8.5 (L) 8.9 - 10.3 mg/dL   GFR, Estimated >20 >25 mL/min    Comment: (NOTE) Calculated using the CKD-EPI Creatinine Equation (2021)    Anion gap 7 5 - 15    Comment: Performed at [facility], [address], [place], [place] [address]  CBC     Status: Abnormal   Collection Time: 06/26/21  2:08 AM  Result Value Ref Range   WBC 6.0 4.0 - 10.5 K/uL   RBC 3.80 (L) 4.22 - 5.81 MIL/uL   Hemoglobin 9.6 (L) 13.0 - 17.0 g/dL   HCT 23.7 (L) 62.8 - 31.5 %   MCV 84.7 80.0 - 100.0 fL   MCH 25.3 (L) 26.0 - 34.0  pg   MCHC 29.8 (L) 30.0 - 36.0 g/dL   RDW 17.6 16.0 - 73.7 %   Platelets 387 150 - 400 K/uL   nRBC 0.0 0.0 - 0.2 %    Comment: Performed at [facility], [address] [place], [place] [address]  Comprehensive metabolic panel     Status: Abnormal   Collection Time: 06/27/21  1:22 AM  Result Value Ref Range   Sodium 132 (L) 135 - 145 mmol/L   Potassium 3.8 3.5 - 5.1 mmol/L   Chloride 99 98 - 111 mmol/L   CO2 27 22 - 32 mmol/L   Glucose, Bld 95 70 - 99 mg/dL    Comment: Glucose  reference range applies only to samples taken after fasting for at least 8 hours.   BUN 13 8 - 23 mg/dL   Creatinine, Ser 4.090.97 0.61 - 1.24 mg/dL   Calcium 8.2 (L) 8.9 - 10.3 mg/dL   Total Protein 5.8 (L) 6.5 - 8.1 g/dL   Albumin 2.0 (L) 3.5 - 5.0 g/dL   AST 13 (L) 15 - 41 U/L   ALT 8 0 - 44 U/L   Alkaline Phosphatase 88 38 - 126 U/L   Total Bilirubin 0.8 0.3 - 1.2 mg/dL   GFR, Estimated >81>60 >19>60 mL/min    Comment: (NOTE) Calculated using the CKD-EPI Creatinine Equation (2021)    Anion gap 6 5 - 15    Comment: Performed at [facility], [address] [place], [place] [address]  CBC     Status: Abnormal   Collection Time: 06/27/21  1:22 AM  Result Value Ref Range   WBC 6.4 4.0 - 10.5 K/uL   RBC 3.54 (L) 4.22 - 5.81 MIL/uL   Hemoglobin 9.1 (L) 13.0 - 17.0 g/dL   HCT 95.629.5 (L) 21.339.0 - 08.652.0 %   MCV 83.3 80.0 - 100.0 fL   MCH 25.7 (L) 26.0 - 34.0 pg   MCHC 30.8 30.0 - 36.0 g/dL   RDW 57.814.3 46.911.5 - 62.915.5 %   Platelets 352 150 - 400 K/uL   nRBC 0.0 0.0 - 0.2 %    Comment: Performed at [facility], [address] [place], [place] [address]    DG Chest 1 View  Result Date: 06/26/2021 CLINICAL DATA:  Left-sided pneumothorax EXAM: CHEST  1 VIEW COMPARISON:  06/25/2021 FINDINGS: Single frontal view of the chest demonstrates persistent left-sided hydropneumothorax without significant change since prior exam. Stable pigtail drainage catheter coiled over the left lower hemithorax. Increased  density at the left lung base consistent with areas of lung consolidation and pleural fluid seen on preceding CT. Right chest is clear. Postsurgical changes from median sternotomy again noted. The cardiac silhouette is stable. IMPRESSION: 1. Stable left-sided pigtail drainage catheter, with stable persistent small left hydropneumothorax as above. Electronically Signed   By: Sharlet SalinaMichael  Brown M.D.   On: 06/26/2021 15:07   CT CHEST WO CONTRAST  Result Date: 06/25/2021 CLINICAL DATA:  Pneumothorax concern for trapped lung, pleural thickening due to chronic left pleural effusion EXAM: CT CHEST WITHOUT CONTRAST TECHNIQUE: Multidetector CT imaging of the chest was performed following the standard protocol without IV contrast. COMPARISON:  Radiograph 06/25/2021 FINDINGS: Cardiovascular: Normal cardiac size.Prior CABG. No pericardial disease.Mildly enlarged branch pulmonary arteries.Calcified aortic atherosclerotic calcifications. Mediastinum/Nodes: Multiple prominent mediastinal lymph nodes, similar to prior exam. Unchanged 1.3 cm hypodense right thyroid nodule. Lungs/Pleura: There is a moderate size left-sided hydropneumothorax with multiple locules of gas within fluid in the lung base. Volume loss in the left lung. Thickened visceral pleura of the left lung. There is a left basilar chest tube in place within the pleural space. Multiple calcified granulomas in the left upper lobe. The right lung is clear. Airways are patent. Upper Abdomen: Pneumobilia.  Small hiatal hernia. Musculoskeletal: No acute osseous abnormality.No suspicious lytic or blastic lesions. IMPRESSION: Left-sided hydropneumothorax with parenchymal volume loss and thickened visceral pleura, compatible with a trapped lung. Left basilar chest tube is in place. Multiple prominent mediastinal lymph nodes, similar prior exam and likely reactive. Electronically Signed   By: Caprice RenshawJacob  Kahn M.D.   On: 06/25/2021 18:36   DG CHEST PORT 1 VIEW  Result Date:  06/27/2021  CLINICAL DATA:  Follow-up left-sided pneumothorax EXAM: PORTABLE CHEST 1 VIEW COMPARISON:  1 day prior FINDINGS: Pigtail catheter within the lower left chest, presumably pleural in position, similar. Patient rotated left. Mild cardiomegaly. Left atrial appendage occlusion device. Prior median sternotomy. Left-sided hydropneumothorax again identified. Slightly increased, with the visceral pleural line 3.9 cm from the apical chest wall today versus 2.6 cm on the prior exam. No congestive failure. Clear right lung. Persistent left base atelectasis. IMPRESSION: Small left-sided hydropneumothorax, minimally increased. Persistent left base atelectasis. Electronically Signed   By: Jeronimo Greaves M.D.   On: 06/27/2021 08:08    I personally reviewed the CT and chest x-ray images.  There was a large left pleural effusion with a pneumo ex vacuo after placement of a drain.  Lung remains incompletely reexpanded  Review of Systems  Constitutional:  Positive for activity change, appetite change and fatigue.  HENT:  Positive for dental problem (Multiple missing and cracked teeth).   Respiratory:  Positive for cough and shortness of breath.   Cardiovascular:  Negative for chest pain and leg swelling.  Blood pressure 109/88, pulse 66, temperature 97.8 F (36.6 C), temperature source Oral, resp. rate (!) 25, height 6\' 1"  (1.854 m), weight 90.7 kg, SpO2 99 %. Physical Exam Constitutional:      General: He is not in acute distress. HENT:     Head: Normocephalic and atraumatic.     Mouth/Throat:     Comments: Poor dentition Eyes:     Extraocular Movements: Extraocular movements intact.     Conjunctiva/sclera: Conjunctivae normal.     Pupils: Pupils are equal, round, and reactive to light.  Cardiovascular:     Rate and Rhythm: Normal rate and regular rhythm.     Heart sounds: Normal heart sounds. No murmur heard. Pulmonary:     Effort: Pulmonary effort is normal.     Breath sounds: No wheezing or rales.      Comments: Coarse breath sounds on right and gurgling from chest tube on left Abdominal:     General: There is no distension.     Palpations: Abdomen is soft.  Musculoskeletal:     Cervical back: Neck supple.     Right lower leg: No edema.     Left lower leg: No edema.  Skin:    General: Skin is warm and dry.  Neurological:     General: No focal deficit present.     Mental Status: He is alert and oriented to person, place, and time.     Cranial Nerves: No cranial nerve deficit.    Assessment/Plan: [name] Rice is a 70 year old man with a past medical history significant for coronary artery disease, atrial fibrillation, CABG, maze, and left atrial appendage clipping in September 2021, left bundle branch block, hypertension, carotid disease, and cholecystectomy in November 2021.  He has had a left pleural effusion dating back to his coronary surgery in September 2021.  Initially was rather moderate.  At 1 point thoracentesis was canceled due to low blood pressure.  He presented back in November with cholecystitis and required a cholecystectomy.  He required a thoracentesis at that time.  More recently he presented with a 6-week history of cough and shortness of breath.  He had a massive left pleural effusion.  That now has been drained with a catheter.  However he has incomplete reexpansion consistent with a trapped left lung.  With the effusion dating back almost a year to some degree there is very little hope that the  lung will reexpand on its own.  If not treated definitively, he is going to be at risk for recurrent pleural effusions and recurrent pneumonias.  I doubt thrombolytics would be effective in this setting given the chronicity.  My opinion the best option would be surgical decortication to reexpand the lung due to pleural synthesis.  I discussed the proposed operation with Mr. Wussow.  The plan would be to attempt robotic decortication.  He understands there is a high  likelihood of needing to convert to a VATS or thoracotomy.  I discussed the indications, risks, benefits, and alternatives.  He understands the risks include, but not limited to death, MI, DVT, PE, bleeding, possible need for transfusion, infection, prolonged air leak, as well as other unforeseeable complications.  He is day 4 of empiric antibiotics with ceftriaxone and Flagyl.  We will tentatively plan surgery mid next week depending on schedule.  Xarelto on hold.  Would consider covering with heparin intravenously pending surgery. Mckenzie Bove C Delana Manganello 06/27/2021, 11:11 AM      

## 2021-06-27 NOTE — H&P (View-Only) (Signed)
Reason for Consult: Trapped left lung Referring Physician: Dr. [name] is an 70 y.o. male.  HPI: 70 year old man who presented with shortness of breath.  [name] Rice is a 70 year old man with a past medical history significant for coronary artery disease, atrial fibrillation, CABG, maze, and left atrial appendage clipping in September 2021, left bundle branch block, hypertension, carotid disease, and cholecystectomy in November 2021.  [name] Rice had coronary bypass grafting in September 2021 (Dr. Vickey Sages).  He also had a Maze procedure and left atrial appendage clipping at that time.  He did have a postoperative left pleural effusion.  He was readmitted in November 2021 for cholecystectomy.  He had a large left pleural effusion and had almost 2 L of fluid drained.  No further follow-up with the pleural effusion was noted after that.  He presented with about a 6-week history of progressive cough, shortness of breath, and general malaise.  Chest x-ray showed near complete white out of the left chest.  An ultrasound-guided thoracentesis on 8/16 drained 1.2 L of hazy fluid.  The LDH was elevated at 179.  A pigtail catheter was placed on 06/24/2021.  He drained 3 L over the first 24 hours and then another 650 mL yesterday.  His breathing has improved significantly since the fluid was drained.  Chest x-ray showed incomplete reexpansion with a pneumo ex vacuo.  He denies any fevers or chills.  He was started on antibiotics empirically.  Cultures of the pleural fluid are no growth to date.  He has not been having any chest pain, pressure, or tightness.  He is on Xarelto chronically.  That has been held since admission.  Past Medical History:  Diagnosis Date   Atrial fibrillation (HCC)    Carotid artery stenosis    Coronary artery disease    Dysrhythmia    Hypertension    LBBB (left bundle branch block)     Past Surgical History:  Procedure Laterality Date   CARDIOVERSION N/A  09/28/2016   Procedure: CARDIOVERSION;  Surgeon: Yates Decamp, MD;  Location: [facility] ENDOSCOPY;  Service: Cardiovascular;  Laterality: N/A;   CATARACT EXTRACTION, BILATERAL     CHEST TUBE INSERTION N/A 06/24/2021   Procedure: CHEST TUBE INSERTION;  Surgeon: Martina Sinner, MD;  Location: [facility] ENDOSCOPY;  Service: Pulmonary;  Laterality: N/A;   CHOLECYSTECTOMY N/A 09/18/2020   Procedure: LAPAROSCOPIC PARTIAL CHOLECYSTECTOMY;  Surgeon: Ovidio Kin, MD;  Location: [facility] ORS;  Service: General;  Laterality: N/A;   CLIPPING OF ATRIAL APPENDAGE Left 07/29/2020   Procedure: CLIPPING OF ATRIAL APPENDAGE;  Surgeon: Linden Dolin, MD;  Location: [facility] OR;  Service: Open Heart Surgery;  Laterality: Left;   CORONARY ARTERY BYPASS GRAFT N/A 07/29/2020   Procedure: CORONARY ARTERY BYPASS GRAFTING (CABG) TIMES FOUR USING BILATERAL INTERNAL MAMMARIES AND LEFT RADIAL ARTERY;  Surgeon: Linden Dolin, MD;  Location: [facility] OR;  Service: Open Heart Surgery;  Laterality: N/A;   ERCP N/A 09/17/2020   Procedure: ENDOSCOPIC RETROGRADE CHOLANGIOPANCREATOGRAPHY (ERCP);  Surgeon: Meryl Dare, MD;  Location: [facility] ENDOSCOPY;  Service: Endoscopy;  Laterality: N/A;   IR THORACENTESIS ASP PLEURAL SPACE W/IMG GUIDE  06/23/2021   LEFT HEART CATH AND CORONARY ANGIOGRAPHY N/A 07/25/2020   Procedure: LEFT HEART CATH AND CORONARY ANGIOGRAPHY;  Surgeon: Yates Decamp, MD;  Location: [facility] INVASIVE CV LAB;  Service: Cardiovascular;  Laterality: N/A;   MAZE N/A 07/29/2020   Procedure: MAZE;  Surgeon: Linden Dolin, MD;  Location: [facility] OR;  Service: Open Heart  Surgery;  Laterality: N/A;   RADIAL ARTERY HARVEST Left 07/29/2020   Procedure: RADIAL ARTERY HARVEST;  Surgeon: Linden Dolin, MD;  Location: [facility] OR;  Service: Open Heart Surgery;  Laterality: Left;   REMOVAL OF STONES  09/17/2020   Procedure: REMOVAL OF STONES;  Surgeon: Meryl Dare, MD;  Location: [facility] ENDOSCOPY;  Service: Endoscopy;;   SPHINCTEROTOMY  09/17/2020   Procedure:  Dennison Mascot;  Surgeon: Meryl Dare, MD;  Location: [facility] ENDOSCOPY;  Service: Endoscopy;;   TEE WITHOUT CARDIOVERSION N/A 07/29/2020   Procedure: TRANSESOPHAGEAL ECHOCARDIOGRAM (TEE);  Surgeon: Linden Dolin, MD;  Location: [facility] OR;  Service: Open Heart Surgery;  Laterality: N/A;    Family History  Problem Relation Age of Onset   Heart disease Mother    Hyperlipidemia Mother    Heart disease Father    Hypertension Other     Social History:  reports that he has never smoked. He has never used smokeless tobacco. He reports that he does not drink alcohol and does not use drugs.  Allergies: No Known Allergies  Medications: Scheduled:  metoprolol tartrate  12.5 mg Oral Daily   rosuvastatin  20 mg Oral Daily   sodium chloride flush  10 mL Intracatheter Q8H   sodium chloride flush  3 mL Intravenous Q12H   traZODone  50 mg Oral QHS    Results for orders placed or performed during the hospital encounter of 06/22/21 (from the past 48 hour(s))  Basic metabolic panel     Status: Abnormal   Collection Time: 06/26/21  2:08 AM  Result Value Ref Range   Sodium 134 (L) 135 - 145 mmol/L   Potassium 3.8 3.5 - 5.1 mmol/L   Chloride 99 98 - 111 mmol/L   CO2 28 22 - 32 mmol/L   Glucose, Bld 97 70 - 99 mg/dL    Comment: Glucose reference range applies only to samples taken after fasting for at least 8 hours.   BUN 13 8 - 23 mg/dL   Creatinine, Ser 7.32 0.61 - 1.24 mg/dL   Calcium 8.5 (L) 8.9 - 10.3 mg/dL   GFR, Estimated >20 >25 mL/min    Comment: (NOTE) Calculated using the CKD-EPI Creatinine Equation (2021)    Anion gap 7 5 - 15    Comment: Performed at [facility], [address], [place], [place] [address]  CBC     Status: Abnormal   Collection Time: 06/26/21  2:08 AM  Result Value Ref Range   WBC 6.0 4.0 - 10.5 K/uL   RBC 3.80 (L) 4.22 - 5.81 MIL/uL   Hemoglobin 9.6 (L) 13.0 - 17.0 g/dL   HCT 23.7 (L) 62.8 - 31.5 %   MCV 84.7 80.0 - 100.0 fL   MCH 25.3 (L) 26.0 - 34.0  pg   MCHC 29.8 (L) 30.0 - 36.0 g/dL   RDW 17.6 16.0 - 73.7 %   Platelets 387 150 - 400 K/uL   nRBC 0.0 0.0 - 0.2 %    Comment: Performed at [facility], [address] [place], [place] [address]  Comprehensive metabolic panel     Status: Abnormal   Collection Time: 06/27/21  1:22 AM  Result Value Ref Range   Sodium 132 (L) 135 - 145 mmol/L   Potassium 3.8 3.5 - 5.1 mmol/L   Chloride 99 98 - 111 mmol/L   CO2 27 22 - 32 mmol/L   Glucose, Bld 95 70 - 99 mg/dL    Comment: Glucose  reference range applies only to samples taken after fasting for at least 8 hours.   BUN 13 8 - 23 mg/dL   Creatinine, Ser 4.090.97 0.61 - 1.24 mg/dL   Calcium 8.2 (L) 8.9 - 10.3 mg/dL   Total Protein 5.8 (L) 6.5 - 8.1 g/dL   Albumin 2.0 (L) 3.5 - 5.0 g/dL   AST 13 (L) 15 - 41 U/L   ALT 8 0 - 44 U/L   Alkaline Phosphatase 88 38 - 126 U/L   Total Bilirubin 0.8 0.3 - 1.2 mg/dL   GFR, Estimated >81>60 >19>60 mL/min    Comment: (NOTE) Calculated using the CKD-EPI Creatinine Equation (2021)    Anion gap 6 5 - 15    Comment: Performed at [facility], [address] [place], [place] [address]  CBC     Status: Abnormal   Collection Time: 06/27/21  1:22 AM  Result Value Ref Range   WBC 6.4 4.0 - 10.5 K/uL   RBC 3.54 (L) 4.22 - 5.81 MIL/uL   Hemoglobin 9.1 (L) 13.0 - 17.0 g/dL   HCT 95.629.5 (L) 21.339.0 - 08.652.0 %   MCV 83.3 80.0 - 100.0 fL   MCH 25.7 (L) 26.0 - 34.0 pg   MCHC 30.8 30.0 - 36.0 g/dL   RDW 57.814.3 46.911.5 - 62.915.5 %   Platelets 352 150 - 400 K/uL   nRBC 0.0 0.0 - 0.2 %    Comment: Performed at [facility], [address] [place], [place] [address]    DG Chest 1 View  Result Date: 06/26/2021 CLINICAL DATA:  Left-sided pneumothorax EXAM: CHEST  1 VIEW COMPARISON:  06/25/2021 FINDINGS: Single frontal view of the chest demonstrates persistent left-sided hydropneumothorax without significant change since prior exam. Stable pigtail drainage catheter coiled over the left lower hemithorax. Increased  density at the left lung base consistent with areas of lung consolidation and pleural fluid seen on preceding CT. Right chest is clear. Postsurgical changes from median sternotomy again noted. The cardiac silhouette is stable. IMPRESSION: 1. Stable left-sided pigtail drainage catheter, with stable persistent small left hydropneumothorax as above. Electronically Signed   By: Sharlet SalinaMichael  Brown M.D.   On: 06/26/2021 15:07   CT CHEST WO CONTRAST  Result Date: 06/25/2021 CLINICAL DATA:  Pneumothorax concern for trapped lung, pleural thickening due to chronic left pleural effusion EXAM: CT CHEST WITHOUT CONTRAST TECHNIQUE: Multidetector CT imaging of the chest was performed following the standard protocol without IV contrast. COMPARISON:  Radiograph 06/25/2021 FINDINGS: Cardiovascular: Normal cardiac size.Prior CABG. No pericardial disease.Mildly enlarged branch pulmonary arteries.Calcified aortic atherosclerotic calcifications. Mediastinum/Nodes: Multiple prominent mediastinal lymph nodes, similar to prior exam. Unchanged 1.3 cm hypodense right thyroid nodule. Lungs/Pleura: There is a moderate size left-sided hydropneumothorax with multiple locules of gas within fluid in the lung base. Volume loss in the left lung. Thickened visceral pleura of the left lung. There is a left basilar chest tube in place within the pleural space. Multiple calcified granulomas in the left upper lobe. The right lung is clear. Airways are patent. Upper Abdomen: Pneumobilia.  Small hiatal hernia. Musculoskeletal: No acute osseous abnormality.No suspicious lytic or blastic lesions. IMPRESSION: Left-sided hydropneumothorax with parenchymal volume loss and thickened visceral pleura, compatible with a trapped lung. Left basilar chest tube is in place. Multiple prominent mediastinal lymph nodes, similar prior exam and likely reactive. Electronically Signed   By: Caprice RenshawJacob  Kahn M.D.   On: 06/25/2021 18:36   DG CHEST PORT 1 VIEW  Result Date:  06/27/2021  CLINICAL DATA:  Follow-up left-sided pneumothorax EXAM: PORTABLE CHEST 1 VIEW COMPARISON:  1 day prior FINDINGS: Pigtail catheter within the lower left chest, presumably pleural in position, similar. Patient rotated left. Mild cardiomegaly. Left atrial appendage occlusion device. Prior median sternotomy. Left-sided hydropneumothorax again identified. Slightly increased, with the visceral pleural line 3.9 cm from the apical chest wall today versus 2.6 cm on the prior exam. No congestive failure. Clear right lung. Persistent left base atelectasis. IMPRESSION: Small left-sided hydropneumothorax, minimally increased. Persistent left base atelectasis. Electronically Signed   By: Jeronimo Greaves M.D.   On: 06/27/2021 08:08    I personally reviewed the CT and chest x-ray images.  There was a large left pleural effusion with a pneumo ex vacuo after placement of a drain.  Lung remains incompletely reexpanded  Review of Systems  Constitutional:  Positive for activity change, appetite change and fatigue.  HENT:  Positive for dental problem (Multiple missing and cracked teeth).   Respiratory:  Positive for cough and shortness of breath.   Cardiovascular:  Negative for chest pain and leg swelling.  Blood pressure 109/88, pulse 66, temperature 97.8 F (36.6 C), temperature source Oral, resp. rate (!) 25, height 6\' 1"  (1.854 m), weight 90.7 kg, SpO2 99 %. Physical Exam Constitutional:      General: He is not in acute distress. HENT:     Head: Normocephalic and atraumatic.     Mouth/Throat:     Comments: Poor dentition Eyes:     Extraocular Movements: Extraocular movements intact.     Conjunctiva/sclera: Conjunctivae normal.     Pupils: Pupils are equal, round, and reactive to light.  Cardiovascular:     Rate and Rhythm: Normal rate and regular rhythm.     Heart sounds: Normal heart sounds. No murmur heard. Pulmonary:     Effort: Pulmonary effort is normal.     Breath sounds: No wheezing or rales.      Comments: Coarse breath sounds on right and gurgling from chest tube on left Abdominal:     General: There is no distension.     Palpations: Abdomen is soft.  Musculoskeletal:     Cervical back: Neck supple.     Right lower leg: No edema.     Left lower leg: No edema.  Skin:    General: Skin is warm and dry.  Neurological:     General: No focal deficit present.     Mental Status: He is alert and oriented to person, place, and time.     Cranial Nerves: No cranial nerve deficit.    Assessment/Plan: [name] Rice is a 70 year old man with a past medical history significant for coronary artery disease, atrial fibrillation, CABG, maze, and left atrial appendage clipping in September 2021, left bundle branch block, hypertension, carotid disease, and cholecystectomy in November 2021.  He has had a left pleural effusion dating back to his coronary surgery in September 2021.  Initially was rather moderate.  At 1 point thoracentesis was canceled due to low blood pressure.  He presented back in November with cholecystitis and required a cholecystectomy.  He required a thoracentesis at that time.  More recently he presented with a 6-week history of cough and shortness of breath.  He had a massive left pleural effusion.  That now has been drained with a catheter.  However he has incomplete reexpansion consistent with a trapped left lung.  With the effusion dating back almost a year to some degree there is very little hope that the  lung will reexpand on its own.  If not treated definitively, he is going to be at risk for recurrent pleural effusions and recurrent pneumonias.  I doubt thrombolytics would be effective in this setting given the chronicity.  My opinion the best option would be surgical decortication to reexpand the lung due to pleural synthesis.  I discussed the proposed operation with [name] Rice.  The plan would be to attempt robotic decortication.  He understands there is a high  likelihood of needing to convert to a VATS or thoracotomy.  I discussed the indications, risks, benefits, and alternatives.  He understands the risks include, but not limited to death, MI, DVT, PE, bleeding, possible need for transfusion, infection, prolonged air leak, as well as other unforeseeable complications.  He is day 4 of empiric antibiotics with ceftriaxone and Flagyl.  We will tentatively plan surgery mid next week depending on schedule.  Xarelto on hold.  Would consider covering with heparin intravenously pending surgery. Loreli Slot 06/27/2021, 11:11 AM

## 2021-06-28 ENCOUNTER — Inpatient Hospital Stay (HOSPITAL_COMMUNITY): Payer: Medicare Other

## 2021-06-28 DIAGNOSIS — J9819 Other pulmonary collapse: Secondary | ICD-10-CM

## 2021-06-28 DIAGNOSIS — J90 Pleural effusion, not elsewhere classified: Secondary | ICD-10-CM

## 2021-06-28 NOTE — Progress Notes (Signed)
PCCM interval progress note:  Chest tube removed without difficulty, repeat CXR in the AM.    Darcella Gasman Satsuki Zillmer, PA-C

## 2021-06-28 NOTE — Progress Notes (Signed)
PROGRESS NOTE  [name] Rice GGY:[id number] DOB: 08/16/1951 DOA: 06/22/2021 PCP: [name], PA-C   LOS: 5 days   Brief Narrative / Interim history: [name] Rice is a 70 y.o. male with medical history significant of anemia, A. fib, hypertension, cholecystitis and choledocholithiasis status post cholecystectomy, CKD 3, CAD s/p CABG, gilberts syndrome, hyperlipidemia, left bundle branch block who presents with ongoing shortness of breath.  He was found to have a large L sided effusion.  Admitted for management.  He had a left-sided effusion last year as well in September during his admission for laparoscopic cholecystectomy.  Subjective / 24h Interval events: Denies any shortness of breath.  Feels well  Assessment & Plan: Principal Problem Recurrent left-sided pleural effusion-loculated, appears to be complicated.  Exudative in nature.  Status post chest tube placement on 8/17 and removal 8/21.  Repeat chest x-ray -Repeat cytology, cultures, further management per pulmonary -CT scan done with trapped lung, likely suggesting longstanding effusion. -Thoracic surgery consulted, plan for robotic decortication on Wednesday  Active Problems Acute hypoxic respiratory failure-resolved, he remains on room air  History of A. Fib -continue metoprolol, hold anticoagulation given current plans  Essential hypertension-continue metoprolol  CAD, status post CABG 2021-no chest pain, stable  Hyperlipidemia-continue statin  Normocytic anemia-of chronic disease   Scheduled Meds:  metoprolol tartrate  12.5 mg Oral Daily   rosuvastatin  20 mg Oral Daily   sodium chloride flush  10 mL Intracatheter Q8H   sodium chloride flush  3 mL Intravenous Q12H   traZODone  50 mg Oral QHS   Continuous Infusions:  cefTRIAXone (ROCEPHIN)  IV 200 mL/hr at 06/27/21 1632   metronidazole 500 mg (06/28/21 0455)   PRN Meds:.acetaminophen **OR** acetaminophen, morphine injection, polyethylene glycol  Diet  Orders (From admission, onward)     Start     Ordered   06/22/21 1901  Diet Heart Room service appropriate? Yes; Fluid consistency: Thin  Diet effective now       Question Answer Comment  Room service appropriate? Yes   Fluid consistency: Thin      06/22/21 1902            DVT prophylaxis: Place and maintain sequential compression device Start: 06/23/21 1852     Code Status: Full Code  Family Communication: no family at bedside   Status is: Inpatient  Remains inpatient appropriate because:Inpatient level of care appropriate due to severity of illness  Dispo: The patient is from: Home              Anticipated d/c is to: Home              Patient currently is not medically stable to d/c.   Difficult to place patient No   Level of care: Telemetry Medical  Consultants:  PCCM  Procedures:  none  Microbiology  none  Antimicrobials: none    Objective: Vitals:   06/27/21 2104 06/28/21 0449 06/28/21 0834 06/28/21 1249  BP: (!) 115/57 113/68 117/67 116/66  Pulse: 73 69  (!) 55  Resp: (!) 25 18 19 18   Temp: 98.2 F (36.8 C) 97.9 F (36.6 C) 97.9 F (36.6 C)   TempSrc: Oral Oral Oral   SpO2: 98% 99% 98% 100%  Weight:  91.1 kg    Height:        Intake/Output Summary (Last 24 hours) at 06/28/2021 1319 Last data filed at 06/28/2021 0838 Gross per 24 hour  Intake 840.89 ml  Output 900 ml  Net -59.11  ml    Filed Weights   06/26/21 0201 06/27/21 0435 06/28/21 0449  Weight: 90.3 kg 90.7 kg 91.1 kg    Examination:  Constitutional: NAD, in bed Eyes no scleral icterus ENMT: mmm Neck: normal, supple Respiratory: Clear bilaterally, no wheezing, no crackles Cardiovascular: Regular rate and rhythm, no murmurs, no edema Abdomen: Soft, NT, ND, bowel sounds positive Musculoskeletal: no clubbing / cyanosis.  Skin: No rashes appreciated  Data Reviewed: I have independently reviewed following labs and imaging studies   CBC: Recent Labs  Lab 06/22/21 1620  06/23/21 0206 06/24/21 0226 06/25/21 0057 06/26/21 0208 06/27/21 0122  WBC 7.1 7.0 6.9 9.3 6.0 6.4  NEUTROABS 5.3  --  4.5  --   --   --   HGB 10.4* 9.9* 9.3* 10.0* 9.6* 9.1*  HCT 35.3* 33.8* 31.1* 32.3* 32.2* 29.5*  MCV 85.9 87.1 85.0 83.9 84.7 83.3  PLT 404* 368 345 363 387 352    Basic Metabolic Panel: Recent Labs  Lab 06/23/21 0206 06/24/21 0226 06/25/21 0057 06/26/21 0208 06/27/21 0122  NA 137 135 135 134* 132*  K 4.6 4.0 4.2 3.8 3.8  CL 102 101 99 99 99  CO2 26 26 28 28 27   GLUCOSE 93 84 101* 97 95  BUN 13 12 13 13 13   CREATININE 1.00 1.02 1.07 1.00 0.97  CALCIUM 8.8* 8.6* 8.5* 8.5* 8.2*  MG  --  2.0  --   --   --   PHOS  --  2.8  --   --   --     Liver Function Tests: Recent Labs  Lab 06/23/21 0206 06/24/21 0226 06/27/21 0122  AST 13* 13* 13*  ALT 8 8 8   ALKPHOS 98 90 88  BILITOT 1.4* 1.5* 0.8  PROT 6.6 6.1* 5.8*  ALBUMIN 2.5* 2.3* 2.0*    Coagulation Profile: Recent Labs  Lab 06/23/21 0206  INR 1.7*    HbA1C: No results for input(s): HGBA1C in the last 72 hours. CBG: No results for input(s): GLUCAP in the last 168 hours.  Recent Results (from the past 240 hour(s))  Resp Panel by RT-PCR (Flu A&B, Covid) Nasopharyngeal Swab     Status: None   Collection Time: 06/22/21  5:35 PM   Specimen: Nasopharyngeal Swab; Nasopharyngeal(NP) swabs in vial transport medium  Result Value Ref Range Status   SARS Coronavirus 2 by RT PCR NEGATIVE NEGATIVE Final    Comment: (NOTE) SARS-CoV-2 target nucleic acids are NOT DETECTED.  The SARS-CoV-2 RNA is generally detectable in upper respiratory specimens during the acute phase of infection. The lowest concentration of SARS-CoV-2 viral copies this assay can detect is 138 copies/mL. A negative result does not preclude SARS-Cov-2 infection and should not be used as the sole basis for treatment or other patient management decisions. A negative result may occur with  improper specimen collection/handling,  submission of specimen other than nasopharyngeal swab, presence of viral mutation(s) within the areas targeted by this assay, and inadequate number of viral copies(<138 copies/mL). A negative result must be combined with clinical observations, patient history, and epidemiological information. The expected result is Negative.  Fact Sheet for Patients:   Fact Sheet for Healthcare Providers:  06/25/21  This test is no t yet approved or cleared by the 06/24/21 FDA and  has been authorized for detection and/or diagnosis of SARS-CoV-2 by FDA under an Emergency Use Authorization (EUA). This EUA will remain  in effect (meaning this test can be used) for the duration of  the COVID-19 declaration under Section 564(b)(1) of the Act, 21 U.S.C.section 360bbb-3(b)(1), unless the authorization is terminated  or revoked sooner.       Influenza A by PCR NEGATIVE NEGATIVE Final   Influenza B by PCR NEGATIVE NEGATIVE Final    Comment: (NOTE) The Xpert Xpress SARS-CoV-2/FLU/RSV plus assay is intended as an aid in the diagnosis of influenza from Nasopharyngeal swab specimens and should not be used as a sole basis for treatment. Nasal washings and aspirates are unacceptable for Xpert Xpress SARS-CoV-2/FLU/RSV testing.  Fact Sheet for Patients: BloggerCourse.com  Fact Sheet for Healthcare Providers: SeriousBroker.it  This test is not yet approved or cleared by the [place] FDA and has been authorized for detection and/or diagnosis of SARS-CoV-2 by FDA under an Emergency Use Authorization (EUA). This EUA will remain in effect (meaning this test can be used) for the duration of the COVID-19 declaration under Section 564(b)(1) of the Act, 21 U.S.C. section 360bbb-3(b)(1), unless the authorization is terminated or revoked.  Performed at [facility], [address] [place], [place] [address]   Body fluid culture w Gram Stain     Status: None   Collection Time: 06/23/21 11:10 AM   Specimen: Lung, Left; Pleural Fluid  Result Value Ref Range Status   Specimen Description PLEURAL FLUID  Final   Special Requests LEFT LUNG  Final   Gram Stain   Final    MODERATE WBC PRESENT, PREDOMINANTLY MONONUCLEAR NO ORGANISMS SEEN    Culture   Final    NO GROWTH Performed at [facility], [address], [place], [place] [address]    Report Status 06/26/2021 FINAL  Final      Radiology Studies: DG CHEST PORT 1 VIEW  Result Date: 06/28/2021 CLINICAL DATA:  Shortness of breath and cough. Evaluate left-sided pleural effusion and pneumothorax. EXAM: PORTABLE CHEST 1 VIEW COMPARISON:  Yesterday FINDINGS: Prior median sternotomy. Left atrial appendage occlusion device. Mild cardiomegaly. Clear right lung. Small left-sided hydropneumothorax is minimally improved with decreased air at the left apex. The amount of inferolateral loculated pleural air is similar. A small bore catheter projects over the lower left chest. Persistent left base atelectasis. IMPRESSION: Minimal decrease in small left-sided hydropneumothorax. Similar adjacent left lower lobe atelectasis. Electronically Signed   By: Jeronimo Greaves M.D.   On: 06/28/2021 12:09     Pamella Pert, MD, PhD [facility]  Between 7 am - 7 pm I am available, please contact me via Amion (for emergencies) or Securechat (non urgent messages)  Between 7 pm - 7 am I am not available, please contact night coverage MD/APP via Amion

## 2021-06-28 NOTE — Progress Notes (Addendum)
NAME:  [name] Rice, MRN:  [id number], DOB:  Sep 04, 1951, LOS: 5 ADMISSION DATE:  06/22/2021, CONSULTATION DATE:  8/17 REFERRING MD:  Elvera Lennox, CHIEF COMPLAINT:  recurrent, complex left Pleural effusion   History of Present Illness:  70 year old male. Presented  8/15 w/ cc: 6 week h/o cough and SOB. Went to Urgent Care where a CXR was obtained and showed large Left Pleural effusion so was referred to ER for further eval.  -review of prior imaging showed small effusion s/p CABG sept 21, this was fairly large during his admit for his lap Cholecystectomy in Nov 21. He was subsequently re-admitted later that month  w/ weakness and right sided hydronephrosis at that time Effusion was much larger so  he had thora & 1.85 liters removed this was exudative. He was d/c w/ very small residual effusion. No films on file until he got admitted this time.  -further questioning specifically about onset of dyspnea:  Onset initially around 6 weeks prior.  This was accompanied by productive cough of copious amounts of clear and at times white sputum.  Also had significant nasal congestion and runny nose.  He denied fever.  Denied sick exposure.  Had some chest discomfort with cough.  Did feel more weak than usual but of note he has never fully returned to his typical activity status after his bypass surgery. Underwent US guided left thoracentesis on 8/16. 1.2 liters Hazy pleural fluid evacuated. LDH: 179, orange, cloudy, nucleated cell ct 4480, glucose 72, cultures pending. 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 LDH: 179, orange, cloudy, nucleated cell ct 4480, glucose 72, cultures pending. PCCM asked to eval for recurrent and complicated left effusion  8/17 Chest tube placed 8/18 3L output 8/19 650cc in atrium. Placed on WS 8/20 750cc in the atrium. Chest tube output ~100cc  8/21 900cc in atrium  Interim History / Subjective:   No acute events overnight, pt reports feeling improved and is planning to go forward with robotic decortication   Objective   Blood pressure 117/67, pulse 69, temperature 97.9 F (36.6 C), temperature source Oral, resp. rate 19, height 6\' 1"  (1.854 m), weight 91.1 kg, SpO2 98 %.        Intake/Output Summary (Last 24 hours) at 06/28/2021 1008 Last data filed at 06/28/2021 06/30/2021 Gross per 24 hour  Intake 840.89 ml  Output 900 ml  Net -59.11 ml    Filed Weights   06/26/21 0201 06/27/21 0435 06/28/21 0449  Weight: 90.3 kg 90.7 kg 91.1 kg   General:  elderly, well-appearing M in no acute distress, sitting up in bed HEENT: MM pink/moist, sclera anicteric  Neuro: awake and alert, moving all extremities without focal deficit CV: s1s2 rrr, no m/r/g PULM:   lungs clear in upper fields with decreased air movement L base, chest tube in place with 150cc output since yesterday, no distress on RA GI: soft, bsx4 active  Extremities: warm/dry, no edema  Skin: no rashes or lesions   CT Chest 8/18 with left hydropneumothorax and thickened visceral pleural. 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 This pleural space appears to have become complicated due to a recent pneumonia with parapneumonic process occurring. He has drained 3-4L of fluid since  placement of chest tube and reports symptomatic improvement with fluid removal He has left pneumothorax which is likely related to trapped lung since he has had a chronic effusion for 10 months.    Chest tube output ~150cc out in the last 24hrs, at 900cc mark  P: -seen by CT surgery and plan for robotic assisted decortication later this week as doubtful prolonged trapped lung will re-expand without surgical intervention -150cc out would expect chronic drainage, will plan to remove tube today -CXR in AM -holding anti-coagulation, pt to work with PT today and get OOB    Best Practice (right click and "Reselect all SmartList Selections" daily)   Per primary   Critical care time: NA   Care Time 34 min   Darcella Gasman Donold Marotto, PA-C [facility] See Amion for pager If no response to pager , please call [phone] until 7pm After 7:00 pm call Elink  [phone]

## 2021-06-28 NOTE — Progress Notes (Signed)
4 Days Post-Op Procedure(s) (LRB): CHEST TUBE INSERTION (N/A) Subjective: No complaints this morning  Objective: Vital signs in last 24 hours: Temp:  [97.9 F (36.6 C)-98.2 F (36.8 C)] 97.9 F (36.6 C) (08/21 0834) Pulse Rate:  [68-73] 69 (08/21 0449) Cardiac Rhythm: Atrial fibrillation (08/21 0700) Resp:  [18-25] 19 (08/21 0834) BP: (113-122)/(57-68) 117/67 (08/21 0834) SpO2:  [97 %-99 %] 98 % (08/21 0834) Weight:  [91.1 kg] 91.1 kg (08/21 0449)  Hemodynamic parameters for last 24 hours:    Intake/Output from previous day: 08/20 0701 - 08/21 0700 In: 840.9 [P.O.:120; IV Piggyback:710.9] Out: 800 [Urine:650; Chest Tube:150] Intake/Output this shift: Total I/O In: -  Out: 300 [Urine:300]  General appearance: alert, cooperative, and no distress Serous drainage from CT  Lab Results: Recent Labs    06/26/21 0208 06/27/21 0122  WBC 6.0 6.4  HGB 9.6* 9.1*  HCT 32.2* 29.5*  PLT 387 352   BMET:  Recent Labs    06/26/21 0208 06/27/21 0122  NA 134* 132*  K 3.8 3.8  CL 99 99  CO2 28 27  GLUCOSE 97 95  BUN 13 13  CREATININE 1.00 0.97  CALCIUM 8.5* 8.2*    PT/INR: No results for input(s): LABPROT, INR in the last 72 hours. ABG    Component Value Date/Time   PHART 7.327 (L) 07/29/2020 2140   HCO3 18.6 (L) 07/29/2020 2140   TCO2 20 (L) 07/29/2020 2140   ACIDBASEDEF 7.0 (H) 07/29/2020 2140   O2SAT 96.0 07/29/2020 2140   CBG (last 3)  No results for input(s): GLUCAP in the last 72 hours.  Assessment/Plan: S/P Procedure(s) (LRB): CHEST TUBE INSERTION (N/A) - Drainage from down significantly over past 24 hours- agree with dc Likely OR Wednesday for decortication   LOS: 5 days    Loreli Slot 06/28/2021

## 2021-06-28 NOTE — Plan of Care (Signed)

## 2021-06-29 ENCOUNTER — Inpatient Hospital Stay (HOSPITAL_COMMUNITY): Payer: Medicare Other

## 2021-06-29 DIAGNOSIS — J948 Other specified pleural conditions: Secondary | ICD-10-CM | POA: Diagnosis not present

## 2021-06-29 NOTE — Progress Notes (Addendum)
NAME:  [name] Rice, MRN:  [id number], DOB:  [birth year], LOS: 6 ADMISSION DATE:  06/22/2021, CONSULTATION DATE:  8/17 REFERRING MD:  Elvera Lennox, CHIEF COMPLAINT:  recurrent, complex left Pleural effusion   History of Present Illness:  70 year old male. Presented  8/15 w/ cc: 6 week h/o cough and SOB. Went to Urgent Care where a CXR was obtained and showed large Left Pleural effusion so was referred to ER for further eval.  -review of prior imaging showed small effusion s/p CABG sept 21, this was fairly large during his admit for his lap Cholecystectomy in Nov 21. He was subsequently re-admitted later that month  w/ weakness and right sided hydronephrosis at that time Effusion was much larger so  he had thora & 1.85 liters removed this was exudative. He was d/c w/ very small residual effusion. No films on file until he got admitted this time.  -further questioning specifically about onset of dyspnea:  Onset initially around 6 weeks prior.  This was accompanied by productive cough of copious amounts of clear and at times white sputum.  Also had significant nasal congestion and runny nose.  He denied fever.  Denied sick exposure.  Had some chest discomfort with cough.  Did feel more weak than usual but of note he has never fully returned to his typical activity status after his bypass surgery. Underwent US guided left thoracentesis on 8/16. 1.2 liters Hazy pleural fluid evacuated. LDH: 179, orange, cloudy, nucleated cell ct 4480, glucose 72, cultures pending. 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 LDH: 179, orange, cloudy, nucleated cell ct 4480, glucose 72, cultures pending. PCCM asked to eval for recurrent and complicated left effusion  8/17 Chest tube placed 8/18 3L output 8/19 650cc in atrium. Placed on WS 8/20 750cc in the atrium. Chest tube output ~100cc  8/21 900cc in atrium; tube removed  Interim History / Subjective:  Feeling better since his chest tube was removed-- less pain and easier to breathe. Still occasionally has a productive cough.  Objective   Blood pressure 123/62, pulse 72, temperature 97.9 F (36.6 C), temperature source Oral, resp. rate (!) 22, height 6\' 1"  (1.854 m), weight 91.1 kg, SpO2 95 %.        Intake/Output Summary (Last 24 hours) at 06/29/2021 0756 Last data filed at 06/28/2021 2100 Gross per 24 hour  Intake --  Output 550 ml  Net -550 ml    Filed Weights   06/27/21 0435 06/28/21 0449 06/29/21 0418  Weight: 90.7 kg 91.1 kg 91.1 kg   General: elderly man sitting up in bed in NAD HEENT: [place]/AT, eyes anicteric Neuro: awake, alert, moving all extremities. Answering questions appropriately. CV: S1S2, RRR PULM:  breathing comfortably on RA, faint rhales cleared by coughing, no wheezing GI: soft, ND Extremities: no clubbing, cyanosis,  or edema  Skin: warm, dry, no rashes   CXR> small residual left effusion silhouetting hemidiaphragm Pleural fluid culture > mod WBCs, NG  Resolved Hospital Problem list     Assessment & Plan:  Left hydropneumothorax due to trapped lung Complicated/exudative  left pleural effusion Concern for trapped lung due to chronic left effusion  since CABG in 09/2020. This pleural space appears to have become complicated due to a recent pneumonia. He drained 3-4L of fluid with placement of chest tube and reported symptomatic improvement with fluid removal. He has left pneumothorax which is likely related to trapped lung since he has had a chronic effusion for 10 months.   P: -Planning for  decortication this week with TCTS. -recommend continued pulmonary hygiene, mobility to prep for surgery. -complete 7 days of antibiotics  PCCM will sign off. Please call with questions.    Best Practice (right click and "Reselect all SmartList Selections" daily)   Per primary   Critical care time: NA   Steffanie Dunn, DO 06/29/21 9:51 AM [facility]  For contact information, see Amion. If no response to pager, please call PCCM consult pager. After hours, 7PM- 7AM, please call Elink.

## 2021-06-29 NOTE — Progress Notes (Signed)
Physical Therapy Treatment Patient Details Name: [name] Rice MRN: [id number] DOB: 1951/10/16 Today's Date: 06/29/2021    History of Present Illness 70 y.o. male presents to [facility] hospital on 06/22/2021 with ongoing SOB. Pt found to have large L sided pleural effusion. Pt underwent thoracentesis on 8/16. PMH includes anemia, A. fib, hypertension, cholecystitis status post cholecystectomy, CKD 3, CAD, Gill Bears syndrome, hyperlipidemia, left bundle branch block. Had chest tube inserted 8/17.    PT Comments    Pt reports mobilizing earlier today, eager to mobilize in hallway again. Pt with improving activity tolerance, ambulating ~300 ft but does require SL support today for min unsteadiness. VSS throughout mobility on RA, SpO2 97-99% on RA. PT to continue to follow.     Follow Up Recommendations  No PT follow up     Equipment Recommendations  None recommended by PT    Recommendations for Other Services       Precautions / Restrictions Precautions Precautions: Fall Precaution Comments: chest tube d/c on 8/21; plan for robotic decortication on 8/24 Restrictions Weight Bearing Restrictions: No    Mobility  Bed Mobility Overal bed mobility: Modified Independent                  Transfers Overall transfer level: Needs assistance Equipment used: None Transfers: Sit to/from Stand Sit to Stand: Min guard         General transfer comment: close guard for safety, STS x2 from EOB and recliner.  Ambulation/Gait Ambulation/Gait assistance: Min guard Gait Distance (Feet): 275 Feet Assistive device: IV Pole Gait Pattern/deviations: Step-through pattern;Decreased stride length Gait velocity: dcr   General Gait Details: close guard for safety, verbal cuing for activity pacing, SPO2 97-99% on RA during gait. Tolerates multidirectional head movement, directional changes without LOB.   Stairs             Wheelchair Mobility    Modified Rankin (Stroke Patients  Only)       Balance Overall balance assessment: Needs assistance Sitting-balance support: Feet supported Sitting balance-Leahy Scale: Good     Standing balance support: No upper extremity supported Standing balance-Leahy Scale: Good Standing balance comment: tolerates mod challenge, one period of unsteadiness which pt corrected by reaching for environment                            Cognition Arousal/Alertness: Awake/alert Behavior During Therapy: WFL for tasks assessed/performed Overall Cognitive Status: Within Functional Limits for tasks assessed                                        Exercises      General Comments        Pertinent Vitals/Pain Pain Assessment: No/denies pain Pain Intervention(s): Monitored during session    Home Living                      Prior Function            PT Goals (current goals can now be found in the care plan section) Acute Rehab PT Goals Patient Stated Goal: to return home PT Goal Formulation: With patient Time For Goal Achievement: 07/08/21 Potential to Achieve Goals: Good Progress towards PT goals: Progressing toward goals    Frequency    Min 3X/week      PT Plan Current plan remains appropriate  Co-evaluation              AM-PAC PT "6 Clicks" Mobility   Outcome Measure  Help needed turning from your back to your side while in a flat bed without using bedrails?: None Help needed moving from lying on your back to sitting on the side of a flat bed without using bedrails?: None Help needed moving to and from a bed to a chair (including a wheelchair)?: A Little Help needed standing up from a chair using your arms (e.g., wheelchair or bedside chair)?: A Little Help needed to walk in hospital room?: A Little Help needed climbing 3-5 steps with a railing? : A Little 6 Click Score: 20    End of Session   Activity Tolerance: Patient tolerated treatment well Patient left: in  chair;with call bell/phone within reach (pt states he will press call button and wait for assist prior to mobilizing back to bed.) Nurse Communication: Mobility status PT Visit Diagnosis: Other abnormalities of gait and mobility (R26.89)     Time: 1407-1430 PT Time Calculation (min) (ACUTE ONLY): 23 min  Charges:  $Gait Training: 8-22 mins                     Marye Round, PT DPT Acute Rehabilitation Services Pager [phone]  Office [phone]    Truddie Coco 06/29/2021, 3:18 PM

## 2021-06-29 NOTE — Plan of Care (Signed)

## 2021-06-29 NOTE — Progress Notes (Signed)
06/29/2021 Central monitor called at 1819 that patient had a 2.20 second paused. Rn assess patient he was asymptomatic. Dr Elvera Lennox was made aware. [facility] RN.

## 2021-06-29 NOTE — Progress Notes (Signed)
PROGRESS NOTE  [name] Rice LFY:[id number] DOB: [birth year] DOA: 06/22/2021 PCP: [name], PA-C   LOS: 6 days   Brief Narrative / Interim history: [name] Rice is a 70 y.o. male with medical history significant of anemia, A. fib, hypertension, cholecystitis and choledocholithiasis status post cholecystectomy, CKD 3, CAD s/p CABG, gilberts syndrome, hyperlipidemia, left bundle branch block who presents with ongoing shortness of breath.  He was found to have a large L sided effusion.  Admitted for management.  He had a left-sided effusion last year as well in September during his admission for laparoscopic cholecystectomy.  Subjective / 24h Interval events: Feels well, wants to walk some more.  No shortness of breath.  Assessment & Plan: Principal Problem Recurrent left-sided pleural effusion-patient here with a loculated, exudative left-sided pleural effusion.  Pulmonology consulted and had a chest tube placed 8/17 and removed 8/21, and a CT scan showed a trapped lung likely suggesting longstanding effusion.  Thoracic surgery also consulted with plans for robotic decortication on Wednesday.  Active Problems Acute hypoxic respiratory failure-resolved, he remains on room air  History of A. Fib -continue metoprolol, hold anticoagulation for now and resume postop  Essential hypertension-continue metoprolol  CAD, status post CABG 2021-no chest pain, stable  Hyperlipidemia-continue statin  Normocytic anemia-of chronic disease   Scheduled Meds:  metoprolol tartrate  12.5 mg Oral Daily   rosuvastatin  20 mg Oral Daily   sodium chloride flush  10 mL Intracatheter Q8H   sodium chloride flush  3 mL Intravenous Q12H   traZODone  50 mg Oral QHS   Continuous Infusions:  cefTRIAXone (ROCEPHIN)  IV 200 mL/hr at 06/28/21 1739   metronidazole 500 mg (06/29/21 0639)   PRN Meds:.acetaminophen **OR** acetaminophen, morphine injection, polyethylene glycol  Diet Orders (From  admission, onward)     Start     Ordered   06/22/21 1901  Diet Heart Room service appropriate? Yes; Fluid consistency: Thin  Diet effective now       Question Answer Comment  Room service appropriate? Yes   Fluid consistency: Thin      06/22/21 1902            DVT prophylaxis: Place and maintain sequential compression device Start: 06/23/21 1852     Code Status: Full Code  Family Communication: no family at bedside   Status is: Inpatient  Remains inpatient appropriate because:Inpatient level of care appropriate due to severity of illness  Dispo: The patient is from: Home              Anticipated d/c is to: Home              Patient currently is not medically stable to d/c.   Difficult to place patient No   Level of care: Telemetry Medical  Consultants:  PCCM  Procedures:  none  Microbiology  none  Antimicrobials: none    Objective: Vitals:   06/28/21 1601 06/28/21 2145 06/29/21 0418 06/29/21 0805  BP:  (!) 127/57 123/62 115/60  Pulse:  61 72   Resp: (!) 22   (!) 21  Temp:  97.8 F (36.6 C) 97.9 F (36.6 C) 98.4 F (36.9 C)  TempSrc:  Oral Oral Oral  SpO2:  99% 95% 98%  Weight:   91.1 kg   Height:        Intake/Output Summary (Last 24 hours) at 06/29/2021 0931 Last data filed at 06/28/2021 2100 Gross per 24 hour  Intake --  Output 250 ml  Net -  250 ml    Filed Weights   06/27/21 0435 06/28/21 0449 06/29/21 0418  Weight: 90.7 kg 91.1 kg 91.1 kg    Examination:  Constitutional: No distress Eyes: anicteric ENMT: mmm Neck: normal, supple Respiratory: CTA biL, no wheezing, no crackles  Cardiovascular: Regular rate and rhythm, no murmurs appreciated.  No peripheral edema Abdomen: Soft, NT, ND, bowel sounds positive Musculoskeletal: no clubbing / cyanosis.  Skin: No rashes seen  Data Reviewed: I have independently reviewed following labs and imaging studies   CBC: Recent Labs  Lab 06/22/21 1620 06/23/21 0206 06/24/21 0226  06/25/21 0057 06/26/21 0208 06/27/21 0122  WBC 7.1 7.0 6.9 9.3 6.0 6.4  NEUTROABS 5.3  --  4.5  --   --   --   HGB 10.4* 9.9* 9.3* 10.0* 9.6* 9.1*  HCT 35.3* 33.8* 31.1* 32.3* 32.2* 29.5*  MCV 85.9 87.1 85.0 83.9 84.7 83.3  PLT 404* 368 345 363 387 352    Basic Metabolic Panel: Recent Labs  Lab 06/23/21 0206 06/24/21 0226 06/25/21 0057 06/26/21 0208 06/27/21 0122  NA 137 135 135 134* 132*  K 4.6 4.0 4.2 3.8 3.8  CL 102 101 99 99 99  CO2 26 26 28 28 27   GLUCOSE 93 84 101* 97 95  BUN 13 12 13 13 13   CREATININE 1.00 1.02 1.07 1.00 0.97  CALCIUM 8.8* 8.6* 8.5* 8.5* 8.2*  MG  --  2.0  --   --   --   PHOS  --  2.8  --   --   --     Liver Function Tests: Recent Labs  Lab 06/23/21 0206 06/24/21 0226 06/27/21 0122  AST 13* 13* 13*  ALT 8 8 8   ALKPHOS 98 90 88  BILITOT 1.4* 1.5* 0.8  PROT 6.6 6.1* 5.8*  ALBUMIN 2.5* 2.3* 2.0*    Coagulation Profile: Recent Labs  Lab 06/23/21 0206  INR 1.7*    HbA1C: No results for input(s): HGBA1C in the last 72 hours. CBG: No results for input(s): GLUCAP in the last 168 hours.  Recent Results (from the past 240 hour(s))  Resp Panel by RT-PCR (Flu A&B, Covid) Nasopharyngeal Swab     Status: None   Collection Time: 06/22/21  5:35 PM   Specimen: Nasopharyngeal Swab; Nasopharyngeal(NP) swabs in vial transport medium  Result Value Ref Range Status   SARS Coronavirus 2 by RT PCR NEGATIVE NEGATIVE Final    Comment: (NOTE) SARS-CoV-2 target nucleic acids are NOT DETECTED.  The SARS-CoV-2 RNA is generally detectable in upper respiratory specimens during the acute phase of infection. The lowest concentration of SARS-CoV-2 viral copies this assay can detect is 138 copies/mL. A negative result does not preclude SARS-Cov-2 infection and should not be used as the sole basis for treatment or other patient management decisions. A negative result may occur with  improper specimen collection/handling, submission of specimen other than  nasopharyngeal swab, presence of viral mutation(s) within the areas targeted by this assay, and inadequate number of viral copies(<138 copies/mL). A negative result must be combined with clinical observations, patient history, and epidemiological information. The expected result is Negative.  Fact Sheet for Patients:   Fact Sheet for Healthcare Providers:  06/25/21  This test is no t yet approved or cleared by the 06/24/21 FDA and  has been authorized for detection and/or diagnosis of SARS-CoV-2 by FDA under an Emergency Use Authorization (EUA). This EUA will remain  in effect (meaning this test can be used) for the  duration of the COVID-19 declaration under Section 564(b)(1) of the Act, 21 U.S.C.section 360bbb-3(b)(1), unless the authorization is terminated  or revoked sooner.       Influenza A by PCR NEGATIVE NEGATIVE Final   Influenza B by PCR NEGATIVE NEGATIVE Final    Comment: (NOTE) The Xpert Xpress SARS-CoV-2/FLU/RSV plus assay is intended as an aid in the diagnosis of influenza from Nasopharyngeal swab specimens and should not be used as a sole basis for treatment. Nasal washings and aspirates are unacceptable for Xpert Xpress SARS-CoV-2/FLU/RSV testing.  Fact Sheet for Patients: BloggerCourse.com  Fact Sheet for Healthcare Providers: SeriousBroker.it  This test is not yet approved or cleared by the [place] FDA and has been authorized for detection and/or diagnosis of SARS-CoV-2 by FDA under an Emergency Use Authorization (EUA). This EUA will remain in effect (meaning this test can be used) for the duration of the COVID-19 declaration under Section 564(b)(1) of the Act, 21 U.S.C. section 360bbb-3(b)(1), unless the authorization is terminated or revoked.  Performed at [facility], [address] [place], [place] [address]    Body fluid culture w Gram Stain     Status: None   Collection Time: 06/23/21 11:10 AM   Specimen: Lung, Left; Pleural Fluid  Result Value Ref Range Status   Specimen Description PLEURAL FLUID  Final   Special Requests LEFT LUNG  Final   Gram Stain   Final    MODERATE WBC PRESENT, PREDOMINANTLY MONONUCLEAR NO ORGANISMS SEEN    Culture   Final    NO GROWTH Performed at [facility], [address] [place], [place] [address]    Report Status 06/26/2021 FINAL  Final      Radiology Studies: DG Chest Port 1 View  Result Date: 06/29/2021 CLINICAL DATA:  Chest tube EXAM: PORTABLE CHEST 1 VIEW COMPARISON:  06/28/2021 FINDINGS: Persistent left-sided pneumothorax, similar to yesterday's study. Right clear. The cardiopericardial silhouette is within normal limits for size. The visualized bony structures of the thorax show no acute abnormality. Telemetry leads overlie the chest. IMPRESSION: No substantial change in left-sided pneumothorax. Electronically Signed   By: Kennith Center M.D.   On: 06/29/2021 08:47   DG CHEST PORT 1 VIEW  Result Date: 06/28/2021 CLINICAL DATA:  Left chest tube removed this am, this is a f/u, pt still having sob and a productive cough EXAM: PORTABLE CHEST - 1 VIEW COMPARISON:  Earlier film of the same day FINDINGS: Removal of pigtail chest tube from the left lung base. Left lateral and apical hydropneumothorax is unchanged from previous day's exam, the lung apex projecting below the posterior aspect left third rib. Right lung clear. Heart size stable.  Left atrial clip.  Sternotomy wires. IMPRESSION: Removal left chest tube with persistent left lateral and apical hydropneumothorax Electronically Signed   By: Corlis Leak M.D.   On: 06/28/2021 16:13     Pamella Pert, MD, PhD [facility]  Between 7 am - 7 pm I am available, please contact me via Amion (for emergencies) or Securechat (non urgent messages)  Between 7 pm - 7 am I am not available, please  contact night coverage MD/APP via Amion

## 2021-06-30 DIAGNOSIS — J90 Pleural effusion, not elsewhere classified: Secondary | ICD-10-CM

## 2021-06-30 LAB — COMPREHENSIVE METABOLIC PANEL
ALT: 9 U/L (ref 0–44)
AST: 13 U/L — ABNORMAL LOW (ref 15–41)
Albumin: 2 g/dL — ABNORMAL LOW (ref 3.5–5.0)
Alkaline Phosphatase: 86 U/L (ref 38–126)
Anion gap: 7 (ref 5–15)
BUN: 9 mg/dL (ref 8–23)
CO2: 29 mmol/L (ref 22–32)
Calcium: 8.4 mg/dL — ABNORMAL LOW (ref 8.9–10.3)
Chloride: 98 mmol/L (ref 98–111)
Creatinine, Ser: 0.94 mg/dL (ref 0.61–1.24)
GFR, Estimated: >60 mL/min (ref >60)
Glucose, Bld: 95 mg/dL (ref 70–99)
Potassium: 4.4 mmol/L (ref 3.5–5.1)
Sodium: 134 mmol/L — ABNORMAL LOW (ref 135–145)
Total Bilirubin: 0.8 mg/dL (ref 0.3–1.2)
Total Protein: 5.7 g/dL — ABNORMAL LOW (ref 6.5–8.1)

## 2021-06-30 LAB — CBC
HCT: 30.7 % — ABNORMAL LOW (ref 39.0–52.0)
Hemoglobin: 9.4 g/dL — ABNORMAL LOW (ref 13.0–17.0)
MCH: 25.5 pg — ABNORMAL LOW (ref 26.0–34.0)
MCHC: 30.6 g/dL (ref 30.0–36.0)
MCV: 83.2 fL (ref 80.0–100.0)
Platelets: 368 10*3/uL (ref 150–400)
RBC: 3.69 MIL/uL — ABNORMAL LOW (ref 4.22–5.81)
RDW: 14.3 % (ref 11.5–15.5)
WBC: 7.1 10*3/uL (ref 4.0–10.5)
nRBC: 0 % (ref 0.0–0.2)

## 2021-06-30 NOTE — Plan of Care (Signed)

## 2021-06-30 NOTE — Progress Notes (Signed)
PROGRESS NOTE  [name] Rice [id number]:[id number] DOB: 07/22/1951 DOA: 06/22/2021 PCP: [name], PA-C   LOS: 7 days   Brief Narrative / Interim history: [name] Rice is a 70 y.o. male with medical history significant of anemia, A. fib, hypertension, cholecystitis and choledocholithiasis status post cholecystectomy, CKD 3, CAD s/p CABG, gilberts syndrome, hyperlipidemia, left bundle branch block who presents with ongoing shortness of breath.  He was found to have a large L sided effusion.  Admitted for management.  He had a left-sided effusion last year as well in September during his admission for laparoscopic cholecystectomy.  Pulmonary was consulted and he briefly had a chest tube which was removed 8/21.  Thoracic surgery consulted as well and patient will undergo robotic decortication on 8/24  Subjective / 24h Interval events: Feels well, no chest pain, no shortness of breath.  Able to ambulate in the hallway without significant difficulties  Assessment & Plan: Principal Problem Recurrent left-sided pleural effusion-patient here with a loculated, exudative left-sided pleural effusion.  Pulmonology consulted and had a chest tube placed 8/17 and removed 8/21, and a CT scan showed a trapped lung likely suggesting longstanding effusion.  Thoracic surgery also consulted with plans for robotic decortication tomorrow  Active Problems Acute hypoxic respiratory failure-resolved, he remains on room air  History of A. Fib -patient had few episodes of intermittent bradycardia and metoprolol has been discontinued on 8/22.  Anticoagulation is on hold.  He has been to reach Dr. Jacinto Halim out of courtesy to let him know that he is here, left a voicemail  Essential hypertension-continue metoprolol  CAD, status post CABG 2021-no chest pain, stable  Hyperlipidemia-continue statin  Normocytic anemia-of chronic disease   Scheduled Meds:  rosuvastatin  20 mg Oral Daily   sodium chloride flush  3 mL  Intravenous Q12H   traZODone  50 mg Oral QHS   Continuous Infusions:  cefTRIAXone (ROCEPHIN)  IV 200 mL/hr at 06/29/21 1618   metronidazole 500 mg (06/30/21 0515)   PRN Meds:.acetaminophen **OR** acetaminophen, morphine injection, polyethylene glycol  Diet Orders (From admission, onward)     Start     Ordered   06/22/21 1901  Diet Heart Room service appropriate? Yes; Fluid consistency: Thin  Diet effective now       Question Answer Comment  Room service appropriate? Yes   Fluid consistency: Thin      06/22/21 1902            DVT prophylaxis: Place and maintain sequential compression device Start: 06/23/21 1852     Code Status: Full Code  Family Communication: no family at bedside   Status is: Inpatient  Remains inpatient appropriate because:Inpatient level of care appropriate due to severity of illness  Dispo: The patient is from: Home              Anticipated d/c is to: Home              Patient currently is not medically stable to d/c.   Difficult to place patient No  Level of care: Telemetry Medical  Consultants:  PCCM  Procedures:  none  Microbiology  none  Antimicrobials: none    Objective: Vitals:   06/29/21 1815 06/29/21 2151 06/30/21 0431 06/30/21 0754  BP:  121/70 (!) 115/50 111/66  Pulse:  71 (!) 58 86  Resp: (!) 21 16 20 18   Temp:  97.6 F (36.4 C) 97.8 F (36.6 C) (!) 97.5 F (36.4 C)  TempSrc:  Oral Oral Oral  SpO2:  96% 98% 96%  Weight:      Height:        Intake/Output Summary (Last 24 hours) at 06/30/2021 0912 Last data filed at 06/30/2021 7619 Gross per 24 hour  Intake --  Output 527 ml  Net -527 ml    Filed Weights   06/27/21 0435 06/28/21 0449 06/29/21 0418  Weight: 90.7 kg 91.1 kg 91.1 kg    Examination:  Constitutional: NAD Eyes: no icterus ENMT: mmm Neck: normal, supple Respiratory: Diminished on the left base, but otherwise clear, no wheezing, no crackles Cardiovascular: rrr, no mrg, no edema Abdomen:  soft, nt, nd, bs+ Musculoskeletal: no clubbing / cyanosis.  Skin: no rashes seen  Data Reviewed: I have independently reviewed following labs and imaging studies   CBC: Recent Labs  Lab 06/24/21 0226 06/25/21 0057 06/26/21 0208 06/27/21 0122 06/30/21 0134  WBC 6.9 9.3 6.0 6.4 7.1  NEUTROABS 4.5  --   --   --   --   HGB 9.3* 10.0* 9.6* 9.1* 9.4*  HCT 31.1* 32.3* 32.2* 29.5* 30.7*  MCV 85.0 83.9 84.7 83.3 83.2  PLT 345 363 387 352 368    Basic Metabolic Panel: Recent Labs  Lab 06/24/21 0226 06/25/21 0057 06/26/21 0208 06/27/21 0122 06/30/21 0134  NA 135 135 134* 132* 134*  K 4.0 4.2 3.8 3.8 4.4  CL 101 99 99 99 98  CO2 26 28 28 27 29   GLUCOSE 84 101* 97 95 95  BUN 12 13 13 13 9   CREATININE 1.02 1.07 1.00 0.97 0.94  CALCIUM 8.6* 8.5* 8.5* 8.2* 8.4*  MG 2.0  --   --   --   --   PHOS 2.8  --   --   --   --     Liver Function Tests: Recent Labs  Lab 06/24/21 0226 06/27/21 0122 06/30/21 0134  AST 13* 13* 13*  ALT 8 8 9   ALKPHOS 90 88 86  BILITOT 1.5* 0.8 0.8  PROT 6.1* 5.8* 5.7*  ALBUMIN 2.3* 2.0* 2.0*    Coagulation Profile: No results for input(s): INR, PROTIME in the last 168 hours.  HbA1C: No results for input(s): HGBA1C in the last 72 hours. CBG: No results for input(s): GLUCAP in the last 168 hours.  Recent Results (from the past 240 hour(s))  Resp Panel by RT-PCR (Flu A&B, Covid) Nasopharyngeal Swab     Status: None   Collection Time: 06/22/21  5:35 PM   Specimen: Nasopharyngeal Swab; Nasopharyngeal(NP) swabs in vial transport medium  Result Value Ref Range Status   SARS Coronavirus 2 by RT PCR NEGATIVE NEGATIVE Final    Comment: (NOTE) SARS-CoV-2 target nucleic acids are NOT DETECTED.  The SARS-CoV-2 RNA is generally detectable in upper respiratory specimens during the acute phase of infection. The lowest concentration of SARS-CoV-2 viral copies this assay can detect is 138 copies/mL. A negative result does not preclude  SARS-Cov-2 infection and should not be used as the sole basis for treatment or other patient management decisions. A negative result may occur with  improper specimen collection/handling, submission of specimen other than nasopharyngeal swab, presence of viral mutation(s) within the areas targeted by this assay, and inadequate number of viral copies(<138 copies/mL). A negative result must be combined with clinical observations, patient history, and epidemiological information. The expected result is Negative.  Fact Sheet for Patients:  07/02/21  Fact Sheet for Healthcare Providers:   This test is no t yet approved or cleared by the 06/24/21  States FDA and  has been authorized for detection and/or diagnosis of SARS-CoV-2 by FDA under an Emergency Use Authorization (EUA). This EUA will remain  in effect (meaning this test can be used) for the duration of the COVID-19 declaration under Section 564(b)(1) of the Act, 21 U.S.C.section 360bbb-3(b)(1), unless the authorization is terminated  or revoked sooner.       Influenza A by PCR NEGATIVE NEGATIVE Final   Influenza B by PCR NEGATIVE NEGATIVE Final    Comment: (NOTE) The Xpert Xpress SARS-CoV-2/FLU/RSV plus assay is intended as an aid in the diagnosis of influenza from Nasopharyngeal swab specimens and should not be used as a sole basis for treatment. Nasal washings and aspirates are unacceptable for Xpert Xpress SARS-CoV-2/FLU/RSV testing.  Fact Sheet for Patients: BloggerCourse.com  Fact Sheet for Healthcare Providers: SeriousBroker.it  This test is not yet approved or cleared by the [place] FDA and has been authorized for detection and/or diagnosis of SARS-CoV-2 by FDA under an Emergency Use Authorization (EUA). This EUA will remain in effect (meaning this test can be used) for the duration of  the COVID-19 declaration under Section 564(b)(1) of the Act, 21 U.S.C. section 360bbb-3(b)(1), unless the authorization is terminated or revoked.  Performed at [facility], [address] [place], [place] [address]   Body fluid culture w Gram Stain     Status: None   Collection Time: 06/23/21 11:10 AM   Specimen: Lung, Left; Pleural Fluid  Result Value Ref Range Status   Specimen Description PLEURAL FLUID  Final   Special Requests LEFT LUNG  Final   Gram Stain   Final    MODERATE WBC PRESENT, PREDOMINANTLY MONONUCLEAR NO ORGANISMS SEEN    Culture   Final    NO GROWTH Performed at [facility], [address], [place], [place] [address]    Report Status 06/26/2021 FINAL  Final      Radiology Studies: No results found.   Pamella Pert, MD, PhD [facility]  Between 7 am - 7 pm I am available, please contact me via Amion (for emergencies) or Securechat (non urgent messages)  Between 7 pm - 7 am I am not available, please contact night coverage MD/APP via Amion

## 2021-06-30 NOTE — Progress Notes (Deleted)
Left lateral

## 2021-06-30 NOTE — Progress Notes (Signed)
6 Days Post-Op Procedure(s) (LRB): CHEST TUBE INSERTION (N/A) Subjective: Feels well  Objective: Vital signs in last 24 hours: Temp:  [97.5 F (36.4 C)-97.8 F (36.6 C)] 97.5 F (36.4 C) (08/23 0754) Pulse Rate:  [58-86] 86 (08/23 0754) Cardiac Rhythm: Atrial fibrillation;Bundle branch block (08/22 1924) Resp:  [16-28] 18 (08/23 0754) BP: (111-126)/(50-70) 111/66 (08/23 0754) SpO2:  [96 %-98 %] 96 % (08/23 0754)  Hemodynamic parameters for last 24 hours:    Intake/Output from previous day: 08/22 0701 - 08/23 0700 In: -  Out: 402 [Urine:401; Stool:1] Intake/Output this shift: Total I/O In: -  Out: 125 [Urine:125]  General appearance: alert, cooperative, and no distress Heart: regular rate and rhythm Lungs: slightly diminished on left  Lab Results: Recent Labs    06/30/21 0134  WBC 7.1  HGB 9.4*  HCT 30.7*  PLT 368   BMET:  Recent Labs    06/30/21 0134  NA 134*  K 4.4  CL 98  CO2 29  GLUCOSE 95  BUN 9  CREATININE 0.94  CALCIUM 8.4*    PT/INR: No results for input(s): LABPROT, INR in the last 72 hours. ABG    Component Value Date/Time   PHART 7.327 (L) 07/29/2020 2140   HCO3 18.6 (L) 07/29/2020 2140   TCO2 20 (L) 07/29/2020 2140   ACIDBASEDEF 7.0 (H) 07/29/2020 2140   O2SAT 96.0 07/29/2020 2140   CBG (last 3)  No results for input(s): GLUCAP in the last 72 hours.  Assessment/Plan: S/P Procedure(s) (LRB): CHEST TUBE INSERTION (N/A) - Cxr shows trapped left lung with incomplete reexpansion after drainage of pleural effusion.  For Robotic assisted left VATS for decortication tomorrow AM  I discussed the general nature of the procedure, inclduing the need for general anesthesia, the incisions to be used and the use of drainage tubes postoperatively with Mr. Heidenreich. We discussed the expected hospital stay, overall recovery and short and long term outcomes. I informed him of the indications, risks, benefits and alternatives.  He understands the risks  include, but are not limited to death, stroke, MI, DVT/PE, bleeding, possible need for transfusion, infections, prolonged air leaks, cardiac arrhythmias, as well as other unforeseeable complications.   He accepts the risks and agrees to proceed.    LOS: 7 days    Loreli Slot 06/30/2021

## 2021-06-30 NOTE — Progress Notes (Signed)
CSW continues to follow for DC needs. Daleen Squibb, MSW, LCSW 8/23/20228:45 AM

## 2021-07-01 ENCOUNTER — Inpatient Hospital Stay (HOSPITAL_COMMUNITY): Payer: Medicare Other | Admitting: Certified Registered"

## 2021-07-01 ENCOUNTER — Encounter (HOSPITAL_COMMUNITY)
Admission: EM | Disposition: A | Discharge status: Home / Self Care | Payer: Self-pay | Source: Home / Self Care | Attending: Internal Medicine

## 2021-07-01 ENCOUNTER — Inpatient Hospital Stay (HOSPITAL_COMMUNITY): Payer: Medicare Other

## 2021-07-01 DIAGNOSIS — J90 Pleural effusion, not elsewhere classified: Secondary | ICD-10-CM

## 2021-07-01 LAB — CBC
HCT: 30.8 % — ABNORMAL LOW (ref 39.0–52.0)
HCT: 28.3 % — ABNORMAL LOW (ref 39.0–52.0)
Hemoglobin: 9.4 g/dL — ABNORMAL LOW (ref 13.0–17.0)
Hemoglobin: 8.5 g/dL — ABNORMAL LOW (ref 13.0–17.0)
MCH: 25.5 pg — ABNORMAL LOW (ref 26.0–34.0)
MCH: 25.5 pg — ABNORMAL LOW (ref 26.0–34.0)
MCHC: 30.5 g/dL (ref 30.0–36.0)
MCHC: 30 g/dL (ref 30.0–36.0)
MCV: 83.7 fL (ref 80.0–100.0)
MCV: 85 fL (ref 80.0–100.0)
Platelets: 379 10*3/uL (ref 150–400)
Platelets: 332 10*3/uL (ref 150–400)
RBC: 3.68 MIL/uL — ABNORMAL LOW (ref 4.22–5.81)
RBC: 3.33 MIL/uL — ABNORMAL LOW (ref 4.22–5.81)
RDW: 14.6 % (ref 11.5–15.5)
RDW: 14.6 % (ref 11.5–15.5)
WBC: 6.5 10*3/uL (ref 4.0–10.5)
WBC: 11.7 10*3/uL — ABNORMAL HIGH (ref 4.0–10.5)
nRBC: 0 % (ref 0.0–0.2)
nRBC: 0 % (ref 0.0–0.2)

## 2021-07-01 LAB — COMPREHENSIVE METABOLIC PANEL
ALT: 7 U/L (ref 0–44)
AST: 13 U/L — ABNORMAL LOW (ref 15–41)
Albumin: 2.1 g/dL — ABNORMAL LOW (ref 3.5–5.0)
Alkaline Phosphatase: 86 U/L (ref 38–126)
Anion gap: 7 (ref 5–15)
BUN: 7 mg/dL — ABNORMAL LOW (ref 8–23)
CO2: 29 mmol/L (ref 22–32)
Calcium: 8.3 mg/dL — ABNORMAL LOW (ref 8.9–10.3)
Chloride: 97 mmol/L — ABNORMAL LOW (ref 98–111)
Creatinine, Ser: 1.01 mg/dL (ref 0.61–1.24)
GFR, Estimated: >60 mL/min (ref >60)
Glucose, Bld: 93 mg/dL (ref 70–99)
Potassium: 4.3 mmol/L (ref 3.5–5.1)
Sodium: 133 mmol/L — ABNORMAL LOW (ref 135–145)
Total Bilirubin: 1 mg/dL (ref 0.3–1.2)
Total Protein: 5.8 g/dL — ABNORMAL LOW (ref 6.5–8.1)

## 2021-07-01 SURGERY — THORACOSCOPY, ROBOT-ASSISTED
Anesthesia: General | Site: Chest | Laterality: Left

## 2021-07-01 MED ORDER — OXYCODONE HCL 5 MG PO TABS
5.0000 mg | ORAL_TABLET | Freq: Once | ORAL | Status: AC | PRN
  Administered 2021-07-01: 5 mg via ORAL

## 2021-07-01 MED ORDER — ALBUMIN HUMAN 5 % IV SOLN
INTRAVENOUS | Status: AC
  Administered 2021-07-01: 12.5 g
  Filled 2021-07-01: qty 250

## 2021-07-01 MED ORDER — ACETAMINOPHEN 160 MG/5ML PO SOLN: 1000.0000 mg | Freq: Once | ORAL | Status: DC | PRN

## 2021-07-01 MED ORDER — ACETAMINOPHEN 10 MG/ML IV SOLN
1000.0000 mg | Freq: Once | INTRAVENOUS | Status: DC | PRN
  Administered 2021-07-01: 1000 mg via INTRAVENOUS

## 2021-07-01 MED ORDER — SODIUM CHLORIDE 0.9 % IV SOLN
INTRAVENOUS | Status: AC | PRN
  Administered 2021-07-01: 2000 mL via INTRAMUSCULAR

## 2021-07-01 MED ORDER — ONDANSETRON HCL 4 MG/2ML IJ SOLN
INTRAMUSCULAR | Status: DC | PRN
  Administered 2021-07-01: 4 mg via INTRAVENOUS

## 2021-07-01 MED ORDER — PROPOFOL 10 MG/ML IV BOLUS
INTRAVENOUS | Status: AC
  Filled 2021-07-01: qty 20

## 2021-07-01 MED ORDER — OXYCODONE HCL 5 MG/5ML PO SOLN: 5.0000 mg | Freq: Once | ORAL | Status: AC | PRN

## 2021-07-01 MED ORDER — ALBUMIN HUMAN 5 % IV SOLN
INTRAVENOUS | Status: AC
  Filled 2021-07-01: qty 250

## 2021-07-01 MED ORDER — BUPIVACAINE LIPOSOME 1.3 % IJ SUSP: INTRAMUSCULAR | Status: DC | PRN

## 2021-07-01 MED ORDER — 0.9 % SODIUM CHLORIDE (POUR BTL) OPTIME
TOPICAL | Status: DC | PRN
  Administered 2021-07-01: 1000 mL

## 2021-07-01 MED ORDER — ROCURONIUM BROMIDE 10 MG/ML (PF) SYRINGE
PREFILLED_SYRINGE | INTRAVENOUS | Status: DC | PRN
  Administered 2021-07-01: 10 mg via INTRAVENOUS
  Administered 2021-07-01 (×2): 50 mg via INTRAVENOUS

## 2021-07-01 MED ORDER — BUPIVACAINE LIPOSOME 1.3 % IJ SUSP
INTRAMUSCULAR | Status: AC
  Filled 2021-07-01: qty 20

## 2021-07-01 MED ORDER — SODIUM CHLORIDE FLUSH 0.9 % IV SOLN
INTRAVENOUS | Status: DC | PRN
  Administered 2021-07-01: 15 mL

## 2021-07-01 MED ORDER — FENTANYL CITRATE (PF) 250 MCG/5ML IJ SOLN
INTRAMUSCULAR | Status: AC
  Filled 2021-07-01: qty 5

## 2021-07-01 MED ORDER — ONDANSETRON HCL 4 MG/2ML IJ SOLN
4.0000 mg | Freq: Four times a day (QID) | INTRAMUSCULAR | Status: DC | PRN
  Administered 2021-07-05: 4 mg via INTRAVENOUS
  Filled 2021-07-01: qty 2

## 2021-07-01 MED ORDER — LIDOCAINE 2% (20 MG/ML) 5 ML SYRINGE
INTRAMUSCULAR | Status: DC | PRN
  Administered 2021-07-01: 60 mg via INTRAVENOUS

## 2021-07-01 MED ORDER — ALBUMIN HUMAN 5 % IV SOLN
12.5000 g | Freq: Once | INTRAVENOUS | Status: AC
  Administered 2021-07-01: 12.5 g via INTRAVENOUS

## 2021-07-01 MED ORDER — FENTANYL CITRATE (PF) 100 MCG/2ML IJ SOLN
25.0000 ug | INTRAMUSCULAR | Status: DC | PRN
  Administered 2021-07-02: 50 ug via INTRAVENOUS
  Filled 2021-07-01: qty 2

## 2021-07-01 MED ORDER — ACETAMINOPHEN 500 MG PO TABS
1000.0000 mg | ORAL_TABLET | Freq: Four times a day (QID) | ORAL | Status: AC
  Administered 2021-07-01 – 2021-07-06 (×18): 1000 mg via ORAL
  Filled 2021-07-01 (×20): qty 2

## 2021-07-01 MED ORDER — ONDANSETRON HCL 4 MG/2ML IJ SOLN
INTRAMUSCULAR | Status: AC
  Filled 2021-07-01: qty 2

## 2021-07-01 MED ORDER — OXYCODONE HCL 5 MG PO TABS
ORAL_TABLET | ORAL | Status: AC
  Filled 2021-07-01: qty 1

## 2021-07-01 MED ORDER — BUPIVACAINE HCL (PF) 0.5 % IJ SOLN
INTRAMUSCULAR | Status: AC
  Filled 2021-07-01: qty 30

## 2021-07-01 MED ORDER — FENTANYL CITRATE (PF) 100 MCG/2ML IJ SOLN
INTRAMUSCULAR | Status: DC | PRN
  Administered 2021-07-01: 100 ug via INTRAVENOUS
  Administered 2021-07-01 (×2): 50 ug via INTRAVENOUS

## 2021-07-01 MED ORDER — PHENYLEPHRINE HCL-NACL 20-0.9 MG/250ML-% IV SOLN
INTRAVENOUS | Status: DC | PRN
  Administered 2021-07-01: 25 ug/min via INTRAVENOUS

## 2021-07-01 MED ORDER — VANCOMYCIN HCL 1000 MG IV SOLR
INTRAVENOUS | Status: DC | PRN
  Administered 2021-07-01: 1000 mg via INTRAVENOUS

## 2021-07-01 MED ORDER — FENTANYL CITRATE (PF) 100 MCG/2ML IJ SOLN
25.0000 ug | INTRAMUSCULAR | Status: DC | PRN
  Administered 2021-07-01: 50 ug via INTRAVENOUS

## 2021-07-01 MED ORDER — SUGAMMADEX SODIUM 200 MG/2ML IV SOLN
INTRAVENOUS | Status: DC | PRN
  Administered 2021-07-01: 200 mg via INTRAVENOUS

## 2021-07-01 MED ORDER — TRAMADOL HCL 50 MG PO TABS
50.0000 mg | ORAL_TABLET | Freq: Four times a day (QID) | ORAL | Status: DC | PRN
  Administered 2021-07-02 – 2021-07-03 (×2): 100 mg via ORAL
  Administered 2021-07-05 (×2): 50 mg via ORAL
  Administered 2021-07-06 – 2021-07-07 (×2): 100 mg via ORAL
  Filled 2021-07-01: qty 2
  Filled 2021-07-01 (×2): qty 1
  Filled 2021-07-01 (×3): qty 2

## 2021-07-01 MED ORDER — EPHEDRINE SULFATE-NACL 50-0.9 MG/10ML-% IV SOSY
PREFILLED_SYRINGE | INTRAVENOUS | Status: DC | PRN
Start: 2021-07-01 — End: 2021-07-01
  Administered 2021-07-01: 5 mg via INTRAVENOUS

## 2021-07-01 MED ORDER — METOCLOPRAMIDE HCL 5 MG/ML IJ SOLN
10.0000 mg | Freq: Four times a day (QID) | INTRAMUSCULAR | Status: AC
  Administered 2021-07-01 – 2021-07-02 (×4): 10 mg via INTRAVENOUS
  Filled 2021-07-01 (×4): qty 2

## 2021-07-01 MED ORDER — FENTANYL CITRATE (PF) 100 MCG/2ML IJ SOLN
INTRAMUSCULAR | Status: AC
  Filled 2021-07-01: qty 2

## 2021-07-01 MED ORDER — LACTATED RINGERS IV SOLN: INTRAVENOUS | Status: DC | PRN

## 2021-07-01 MED ORDER — PHENYLEPHRINE 40 MCG/ML (10ML) SYRINGE FOR IV PUSH (FOR BLOOD PRESSURE SUPPORT)
PREFILLED_SYRINGE | INTRAVENOUS | Status: DC | PRN
  Administered 2021-07-01 (×3): 40 ug via INTRAVENOUS

## 2021-07-01 MED ORDER — FENTANYL CITRATE (PF) 100 MCG/2ML IJ SOLN
50.0000 ug | Freq: Once | INTRAMUSCULAR | Status: AC
  Administered 2021-07-01: 50 ug via INTRAVENOUS

## 2021-07-01 MED ORDER — ACETAMINOPHEN 10 MG/ML IV SOLN
INTRAVENOUS | Status: AC
  Filled 2021-07-01: qty 100

## 2021-07-01 MED ORDER — SENNOSIDES-DOCUSATE SODIUM 8.6-50 MG PO TABS
1.0000 | ORAL_TABLET | Freq: Every day | ORAL | Status: DC
  Administered 2021-07-01 – 2021-07-02 (×2): 1 via ORAL
  Filled 2021-07-01 (×5): qty 1

## 2021-07-01 MED ORDER — ACETAMINOPHEN 160 MG/5ML PO SOLN
1000.0000 mg | Freq: Four times a day (QID) | ORAL | Status: AC
Start: 2021-07-01 — End: 2021-07-06

## 2021-07-01 MED ORDER — PROPOFOL 10 MG/ML IV BOLUS
INTRAVENOUS | Status: DC | PRN
  Administered 2021-07-01: 150 mg via INTRAVENOUS

## 2021-07-01 MED ORDER — ACETAMINOPHEN 500 MG PO TABS: 1000.0000 mg | ORAL_TABLET | Freq: Once | ORAL | Status: DC | PRN

## 2021-07-01 MED ORDER — CHLORHEXIDINE GLUCONATE CLOTH 2 % EX PADS
6.0000 | MEDICATED_PAD | Freq: Every day | CUTANEOUS | Status: DC
  Administered 2021-07-01 – 2021-07-07 (×4): 6 via TOPICAL

## 2021-07-01 MED ORDER — BISACODYL 5 MG PO TBEC
10.0000 mg | DELAYED_RELEASE_TABLET | Freq: Every day | ORAL | Status: DC
  Administered 2021-07-02 – 2021-07-03 (×2): 10 mg via ORAL
  Filled 2021-07-01 (×5): qty 2

## 2021-07-01 MED ORDER — SODIUM CHLORIDE 0.45 % IV SOLN: INTRAVENOUS | Status: DC

## 2021-07-01 MED ORDER — OXYCODONE HCL 5 MG PO TABS
5.0000 mg | ORAL_TABLET | ORAL | Status: DC | PRN
  Administered 2021-07-01: 5 mg via ORAL
  Administered 2021-07-02 – 2021-07-04 (×7): 10 mg via ORAL
  Filled 2021-07-01 (×9): qty 2
  Filled 2021-07-01: qty 1
  Filled 2021-07-01: qty 2

## 2021-07-01 SURGICAL SUPPLY — 98 items
APPLIER CLIP ROT 10 11.4 M/L (STAPLE)
BLADE CLIPPER SURG (BLADE) ×2 IMPLANT
CANISTER SUCT 3000ML PPV (MISCELLANEOUS) ×2 IMPLANT
CANNULA REDUC XI 12-8 STAPL (CANNULA) ×4
CANNULA REDUCER 12-8 DVNC XI (CANNULA) ×2 IMPLANT
CATH THORACIC 28FR (CATHETERS) IMPLANT
CATH THORACIC 28FR RT ANG (CATHETERS) IMPLANT
CATH THORACIC 36FR (CATHETERS) IMPLANT
CATH THORACIC 36FR RT ANG (CATHETERS) IMPLANT
CLEANER TIP ELECTROSURG 2X2 (MISCELLANEOUS) ×1 IMPLANT
CLIP APPLIE ROT 10 11.4 M/L (STAPLE) IMPLANT
CLIP VESOCCLUDE MED 6/CT (CLIP) IMPLANT
CNTNR URN SCR LID CUP LEK RST (MISCELLANEOUS) ×5 IMPLANT
CONN ST 1/4X3/8  BEN (MISCELLANEOUS) ×2
CONN ST 1/4X3/8 BEN (MISCELLANEOUS) IMPLANT
CONN Y 3/8X3/8X3/8  BEN (MISCELLANEOUS) ×2
CONN Y 3/8X3/8X3/8 BEN (MISCELLANEOUS) IMPLANT
CONT SPEC 4OZ STRL OR WHT (MISCELLANEOUS) ×5
DEFOGGER SCOPE WARMER CLEARIFY (MISCELLANEOUS) ×2 IMPLANT
DERMABOND ADVANCED (GAUZE/BANDAGES/DRESSINGS) ×2
DERMABOND ADVANCED .7 DNX12 (GAUZE/BANDAGES/DRESSINGS) IMPLANT
DRAIN CHANNEL 28F RND 3/8 FF (WOUND CARE) ×2 IMPLANT
DRAIN CHANNEL 32F RND 10.7 FF (WOUND CARE) IMPLANT
DRAPE ARM DVNC X/XI (DISPOSABLE) ×4 IMPLANT
DRAPE COLUMN DVNC XI (DISPOSABLE) ×1 IMPLANT
DRAPE CV SPLIT W-CLR ANES SCRN (DRAPES) ×2 IMPLANT
DRAPE DA VINCI XI ARM (DISPOSABLE) ×4
DRAPE DA VINCI XI COLUMN (DISPOSABLE) ×2
DRAPE INCISE IOBAN 66X45 STRL (DRAPES) IMPLANT
DRAPE ORTHO SPLIT 77X108 STRL (DRAPES) ×1
DRAPE SURG ORHT 6 SPLT 77X108 (DRAPES) ×1 IMPLANT
DRAPE WARM FLUID 44X44 (DRAPES) ×2 IMPLANT
ELECT BLADE 6.5 EXT (BLADE) ×1 IMPLANT
ELECT REM PT RETURN 9FT ADLT (ELECTROSURGICAL) ×2
ELECTRODE REM PT RTRN 9FT ADLT (ELECTROSURGICAL) ×1 IMPLANT
GAUZE SPONGE 4X4 12PLY STRL (GAUZE/BANDAGES/DRESSINGS) ×2 IMPLANT
GLOVE TRIUMPH SURG SIZE 7.5 (KITS) ×4 IMPLANT
GOWN STRL REUS W/ TWL LRG LVL3 (GOWN DISPOSABLE) ×2 IMPLANT
GOWN STRL REUS W/ TWL XL LVL3 (GOWN DISPOSABLE) ×3 IMPLANT
GOWN STRL REUS W/TWL 2XL LVL3 (GOWN DISPOSABLE) ×4 IMPLANT
GOWN STRL REUS W/TWL LRG LVL3 (GOWN DISPOSABLE) ×4
GOWN STRL REUS W/TWL XL LVL3 (GOWN DISPOSABLE) ×6
HEMOSTAT SURGICEL 2X14 (HEMOSTASIS) ×4 IMPLANT
IRRIGATION STRYKERFLOW (MISCELLANEOUS) ×1 IMPLANT
IRRIGATOR STRYKERFLOW (MISCELLANEOUS) ×2
IRRIGATOR SUCT 8 DISP DVNC XI (IRRIGATION / IRRIGATOR) IMPLANT
IRRIGATOR SUCTION 8MM XI DISP (IRRIGATION / IRRIGATOR)
KIT BASIN OR (CUSTOM PROCEDURE TRAY) ×2 IMPLANT
KIT SUCTION CATH 14FR (SUCTIONS) IMPLANT
KIT TURNOVER KIT B (KITS) ×2 IMPLANT
LOOP VESSEL SUPERMAXI WHITE (MISCELLANEOUS) IMPLANT
NDL HYPO 25GX1X1/2 BEV (NEEDLE) ×1 IMPLANT
NDL SPNL 18GX3.5 QUINCKE PK (NEEDLE) ×1 IMPLANT
NEEDLE HYPO 25GX1X1/2 BEV (NEEDLE) ×2 IMPLANT
NEEDLE SPNL 18GX3.5 QUINCKE PK (NEEDLE) ×2 IMPLANT
NS IRRIG 1000ML POUR BTL (IV SOLUTION) ×2 IMPLANT
PACK CHEST (CUSTOM PROCEDURE TRAY) ×2 IMPLANT
PAD ARMBOARD 7.5X6 YLW CONV (MISCELLANEOUS) ×4 IMPLANT
SCISSORS LAP 5X35 DISP (ENDOMECHANICALS) IMPLANT
SEAL CANN UNIV 5-8 DVNC XI (MISCELLANEOUS) ×2 IMPLANT
SEAL XI 5MM-8MM UNIVERSAL (MISCELLANEOUS) ×4
SEALANT PROGEL (MISCELLANEOUS) IMPLANT
SEALANT SURG COSEAL 4ML (VASCULAR PRODUCTS) IMPLANT
SEALANT SURG COSEAL 8ML (VASCULAR PRODUCTS) IMPLANT
SET TUBE SMOKE EVAC HIGH FLOW (TUBING) IMPLANT
SHEARS HARMONIC HDI 20CM (ELECTROSURGICAL) IMPLANT
SPECIMEN JAR MEDIUM (MISCELLANEOUS) IMPLANT
SPONGE INTESTINAL PEANUT (DISPOSABLE) IMPLANT
SPONGE TONSIL TAPE 1 RFD (DISPOSABLE) IMPLANT
STAPLER CANNULA SEAL DVNC XI (STAPLE) ×2 IMPLANT
STAPLER CANNULA SEAL XI (STAPLE) ×4
SUT PDS AB 3-0 SH 27 (SUTURE) IMPLANT
SUT PROLENE 4 0 RB 1 (SUTURE)
SUT PROLENE 4-0 RB1 .5 CRCL 36 (SUTURE) IMPLANT
SUT SILK  1 MH (SUTURE) ×2
SUT SILK 1 MH (SUTURE) ×1 IMPLANT
SUT SILK 1 TIES 10X30 (SUTURE) IMPLANT
SUT SILK 2 0 SH (SUTURE) IMPLANT
SUT SILK 2 0SH CR/8 30 (SUTURE) IMPLANT
SUT SILK 3 0 SH 30 (SUTURE) IMPLANT
SUT SILK 3 0SH CR/8 30 (SUTURE) IMPLANT
SUT VIC AB 1 CTX 36 (SUTURE) ×2
SUT VIC AB 1 CTX36XBRD ANBCTR (SUTURE) IMPLANT
SUT VIC AB 2-0 CTX 36 (SUTURE) ×1 IMPLANT
SUT VIC AB 3-0 MH 27 (SUTURE) IMPLANT
SUT VIC AB 3-0 X1 27 (SUTURE) ×4 IMPLANT
SUT VICRYL 0 TIES 12 18 (SUTURE) ×2 IMPLANT
SUT VICRYL 0 UR6 27IN ABS (SUTURE) ×4 IMPLANT
SUT VICRYL 2 TP 1 (SUTURE) IMPLANT
SYR 30ML LL (SYRINGE) ×2 IMPLANT
SYSTEM SAHARA CHEST DRAIN ATS (WOUND CARE) ×2 IMPLANT
TAPE CLOTH 4X10 WHT NS (GAUZE/BANDAGES/DRESSINGS) ×2 IMPLANT
TAPE CLOTH SURG 4X10 WHT LF (GAUZE/BANDAGES/DRESSINGS) ×1 IMPLANT
TIP APPLICATOR SPRAY EXTEND 16 (VASCULAR PRODUCTS) IMPLANT
TOWEL GREEN STERILE (TOWEL DISPOSABLE) ×2 IMPLANT
TRAY FOLEY MTR SLVR 16FR STAT (SET/KITS/TRAYS/PACK) ×2 IMPLANT
TROCAR XCEL 12X100 BLDLESS (ENDOMECHANICALS) ×2 IMPLANT
WATER STERILE IRR 1000ML POUR (IV SOLUTION) ×2 IMPLANT

## 2021-07-01 NOTE — Plan of Care (Signed)
  Problem: Cardiac: Goal: Will achieve and/or maintain hemodynamic stability Outcome: Progressing   Problem: Clinical Measurements: Goal: Postoperative complications will be avoided or minimized Outcome: Progressing   Problem: Respiratory: Goal: Respiratory status will improve Outcome: Progressing   Problem: Pain Management: Goal: Pain level will decrease Outcome: Progressing   

## 2021-07-01 NOTE — Op Note (Signed)
NAME: [name] Rice, [name] Rice MEDICAL RECORD NO: [id number] ACCOUNT NO: [id number] DATE OF BIRTH: 1951-07-03 FACILITY: [facility] LOCATION: [facility] PHYSICIAN: Salvatore Decent. Dorris Fetch, MD  Operative Report   DATE OF PROCEDURE: 07/01/2021  PREOPERATIVE DIAGNOSIS:  Loculated left pleural effusion with trapped lung.  POSTOPERATIVE DIAGNOSIS:  Loculated left pleural effusion with trapped lung.  PROCEDURE:  Robotic-assisted left video assisted thoracoscopic surgery for decortication.  SURGEON:  Charlett Lango, MD  ASSISTANT:  Lowella Dandy, PA  ANESTHESIA:  General.  FINDINGS:  Relatively thin pleural peel. Approximately 300 mL of serosanguineous fluid, gelatinous debris and exudate.  Relatively thin pleural peel primarily involving the lower lobe, parietal pleural peel extensive at base and cardiophrenic angle.  CLINICAL NOTE:  [name] is a 70 year old man who was admitted with cough, shortness of breath and general malaise.  He was found to have near complete whiteout of the left chest.  He had an ultrasound-guided thoracentesis, which showed an exudate.  A  pigtail catheter was placed and he drained a large amount of fluid. However, he had incomplete reexpansion of the lung.  He was advised to undergo robotic-assisted left VATS for decortication to reexpand the lung.  The indications, risks, benefits, and  alternatives were discussed in detail with the patient.  He understood and accepted the risks and agreed to proceed.  DESCRIPTION OF PROCEDURE:  [name] was brought to the operating room on 07/01/2021.  He had induction of general anesthesia and was intubated with a double lumen endotracheal tube.  Sequential compression devices were placed on the calves for DVT  prophylaxis.  He was already receiving ceftriaxone and Flagyl.  He was given a dose of vancomycin in addition. A Foley catheter was placed.  He was placed in a right lateral decubitus position and the left chest was prepped and draped in  the usual  sterile fashion.  Single lung ventilation of the right lung was initiated and was tolerated well throughout the procedure.  A timeout was performed.  An incision was made in the eighth interspace in the mid axillary line.  An 8 mm robotic port was placed.  The thoracoscope was advanced into the chest after confirming intrapleural placement, carbon dioxide was insufflated per  protocol.  Two additional 8 mm ports were placed in the eighth interspace, one anterior and one posterior to the camera port and then an 8 mm AirSeal port was placed in the tenth interspace.  The fluid that was evacuated was primarily serosanguineous.   There was some gelatinous material and some exudate in the pleural space as well.  There was a relatively thin peel on the lower lobe primarily, but also involving the lingula and part of the posterior aspect of the upper lobe.  The plane was developed  and this peel was removed in places, particularly in the lower lobe along the inferior margin and posteriorly, there was some denuded lung parenchyma with removal of the pleural peel.  After adequately decorticating the lung, the visceral pleural peel  was removed.  This came off relatively easily over the majority of the pleural space.  There was more involvement inferiorly near the costophrenic angle and relatively minimal involvement at the apex.  Test inflation was performed and showed good  reexpansion of the lower lobe.  The robot was undocked.  The anterior eighth interspace incision was lengthened to approximately 3 cm.  The sponges that had been used during the dissection were removed and the debris was all evacuated.  The chest was  copiously irrigated with warm saline multiple times.  Two 28-French Blake drains were placed and were secured with #1 silk sutures.  Dual lung ventilation was resumed.  The remaining incisions were closed in standard fashion.  The chest tubes were placed  to Pleur-evac on suction.  The  patient was extubated in the operating room and taken to the postanesthetic care unit in good condition.   SHW D: 07/01/2021 5:31:30 pm T: 07/01/2021 11:52:00 pm  JOB: [id number]/ [id number]

## 2021-07-01 NOTE — Progress Notes (Signed)
PT Cancellation Note  Patient Details Name: JUANDAVID DALLMAN MRN: [id number] DOB: 1951-01-17   Cancelled Treatment:    Reason Eval/Treat Not Completed: Patient at procedure or test/unavailable - per chart pt still in PACU s/p procedure, will check back tomorrow.   Marye Round, PT DPT Acute Rehabilitation Services Pager [phone]  Office [phone]    Truddie Coco 07/01/2021, 3:20 PM

## 2021-07-01 NOTE — Progress Notes (Signed)
PROGRESS NOTE  [name] Rice:[id number] DOB: 07/10/1951 DOA: 06/22/2021 PCP: [name], PA-C   LOS: 8 days   Brief Narrative / Interim history:  [name] Rice is a 70 y.o. male with medical history significant of anemia, A. fib, hypertension, cholecystitis and choledocholithiasis status post cholecystectomy, CKD 3, CAD s/p CABG, gilberts syndrome, hyperlipidemia, left bundle branch block who presents with ongoing shortness of breath.  He was found to have a large L sided effusion.  Admitted for management.  He had a left-sided effusion last year as well in September during his admission for laparoscopic cholecystectomy.  Pulmonary was consulted and he briefly had a chest tube which was removed 8/21.  Thoracic surgery consulted as well and patient went for robotic decortication on 8/24  Subjective / 24h Interval events:  Reports mild post expected surgical pain, but report overall is acceptable.   Assessment & Plan:  Recurrent left-sided pleural effusion -patient here with a loculated, exudative left-sided pleural effusion.  Pulmonology consulted and had a chest tube placed 8/17 and removed 8/21, and a CT scan showed a trapped lung likely suggesting longstanding effusion.   - Thoracic surgery also consulted s/p robotic decortication, follow on postoperative cultures -Continue with IV Rocephin and Flagyl.  Acute hypoxic respiratory failure-resolved, he remains on room air  History of A. Fib -patient had few episodes of intermittent bradycardia and metoprolol has been discontinued on 8/22.  Anticoagulation is on hold.  He has been to reach Dr. Jacinto Halim out of courtesy to let him know that he is here, left a voicemail  Essential hypertension-continue metoprolol  CAD, status post CABG 2021-no chest pain, stable  Hyperlipidemia-continue statin  Normocytic anemia-of chronic disease   Scheduled Meds:  acetaminophen  1,000 mg Oral Q6H   Or   acetaminophen (TYLENOL) oral  liquid 160 mg/5 mL  1,000 mg Oral Q6H   bisacodyl  10 mg Oral Daily   Chlorhexidine Gluconate Cloth  6 each Topical Daily   fentaNYL       metoCLOPramide (REGLAN) injection  10 mg Intravenous Q6H   oxyCODONE       rosuvastatin  20 mg Oral Daily   senna-docusate  1 tablet Oral QHS   sodium chloride flush  3 mL Intravenous Q12H   traZODone  50 mg Oral QHS   Continuous Infusions:  sodium chloride     acetaminophen     albumin human     cefTRIAXone (ROCEPHIN)  IV 1 g (06/30/21 1731)   metronidazole 500 mg (06/30/21 2111)   PRN Meds:.acetaminophen **OR** acetaminophen, fentaNYL (SUBLIMAZE) injection, morphine injection, ondansetron (ZOFRAN) IV, oxyCODONE, polyethylene glycol, traMADol     DVT prophylaxis: SCD's Start: 07/01/21 1726 Place and maintain sequential compression device Start: 06/23/21 1852     Code Status: Full Code  Family Communication: Discussed with brother at bedside  Status is: Inpatient  Remains inpatient appropriate because:Inpatient level of care appropriate due to severity of illness  Dispo: The patient is from: Home              Anticipated d/c is to: Home              Patient currently is not medically stable to d/c.   Difficult to place patient No  Level of care: Progressive  Consultants:  PCCM CT surgery  Procedures:   XI ROBOTIC ASSISTED THORASCOPY (Left) Dr. Dorris Fetch 8/24 -Drainage of Pleural Effusion -Decortication  Microbiology  none  Antimicrobials: none    Objective: Vitals:  07/01/21 1620 07/01/21 1640 07/01/21 1650 07/01/21 1733  BP: 108/65  (!) 119/59 128/73  Pulse: 72 79 87 76  Resp: (!) 23 (!) 23 (!) 22 (!) 22  Temp:   (!) 97.5 F (36.4 C)   TempSrc:    Oral  SpO2: 97% 98% 97% 99%  Weight:      Height:        Intake/Output Summary (Last 24 hours) at 07/01/2021 1820 Last data filed at 07/01/2021 1650 Gross per 24 hour  Intake 1300 ml  Output 1101 ml  Net 199 ml   Filed Weights   06/27/21 0435 06/28/21  0449 06/29/21 0418  Weight: 90.7 kg 91.1 kg 91.1 kg    Examination:  Awake Alert, Oriented X 3, frail, chronically ill-appearing, appears older than stated age, no new F.N deficits, Normal affect Symmetrical Chest wall movement, left lung with scattered rhonchi anteriorly, chest tube from side,. RRR,No Gallops,Rubs or new Murmurs, No Parasternal Heave +ve B.Sounds, Abd Soft, No tenderness, No rebound - guarding or rigidity. No Cyanosis, Clubbing or edema, No new Rash or bruise      Data Reviewed: I have independently reviewed following labs and imaging studies   CBC: Recent Labs  Lab 06/26/21 0208 06/27/21 0122 06/30/21 0134 07/01/21 0258 07/01/21 1400  WBC 6.0 6.4 7.1 6.5 11.7*  HGB 9.6* 9.1* 9.4* 9.4* 8.5*  HCT 32.2* 29.5* 30.7* 30.8* 28.3*  MCV 84.7 83.3 83.2 83.7 85.0  PLT 387 352 368 379 332   Basic Metabolic Panel: Recent Labs  Lab 06/25/21 0057 06/26/21 0208 06/27/21 0122 06/30/21 0134 07/01/21 0258  NA 135 134* 132* 134* 133*  K 4.2 3.8 3.8 4.4 4.3  CL 99 99 99 98 97*  CO2 28 28 27 29 29   GLUCOSE 101* 97 95 95 93  BUN 13 13 13 9  7*  CREATININE 1.07 1.00 0.97 0.94 1.01  CALCIUM 8.5* 8.5* 8.2* 8.4* 8.3*   Liver Function Tests: Recent Labs  Lab 06/27/21 0122 06/30/21 0134 07/01/21 0258  AST 13* 13* 13*  ALT 8 9 7   ALKPHOS 88 86 86  BILITOT 0.8 0.8 1.0  PROT 5.8* 5.7* 5.8*  ALBUMIN 2.0* 2.0* 2.1*   Coagulation Profile: No results for input(s): INR, PROTIME in the last 168 hours.  HbA1C: No results for input(s): HGBA1C in the last 72 hours. CBG: No results for input(s): GLUCAP in the last 168 hours.  Recent Results (from the past 240 hour(s))  Resp Panel by RT-PCR (Flu A&B, Covid) Nasopharyngeal Swab     Status: None   Collection Time: 06/22/21  5:35 PM   Specimen: Nasopharyngeal Swab; Nasopharyngeal(NP) swabs in vial transport medium  Result Value Ref Range Status   SARS Coronavirus 2 by RT PCR NEGATIVE NEGATIVE Final    Comment:  (NOTE) SARS-CoV-2 target nucleic acids are NOT DETECTED.  The SARS-CoV-2 RNA is generally detectable in upper respiratory specimens during the acute phase of infection. The lowest concentration of SARS-CoV-2 viral copies this assay can detect is 138 copies/mL. A negative result does not preclude SARS-Cov-2 infection and should not be used as the sole basis for treatment or other patient management decisions. A negative result may occur with  improper specimen collection/handling, submission of specimen other than nasopharyngeal swab, presence of viral mutation(s) within the areas targeted by this assay, and inadequate number of viral copies(<138 copies/mL). A negative result must be combined with clinical observations, patient history, and epidemiological information. The expected result is Negative.  Fact Sheet for Patients:  BloggerCourse.com  Fact Sheet for Healthcare Providers:  SeriousBroker.it  This test is no t yet approved or cleared by the [place] FDA and  has been authorized for detection and/or diagnosis of SARS-CoV-2 by FDA under an Emergency Use Authorization (EUA). This EUA will remain  in effect (meaning this test can be used) for the duration of the COVID-19 declaration under Section 564(b)(1) of the Act, 21 U.S.C.section 360bbb-3(b)(1), unless the authorization is terminated  or revoked sooner.       Influenza A by PCR NEGATIVE NEGATIVE Final   Influenza B by PCR NEGATIVE NEGATIVE Final    Comment: (NOTE) The Xpert Xpress SARS-CoV-2/FLU/RSV plus assay is intended as an aid in the diagnosis of influenza from Nasopharyngeal swab specimens and should not be used as a sole basis for treatment. Nasal washings and aspirates are unacceptable for Xpert Xpress SARS-CoV-2/FLU/RSV testing.  Fact Sheet for Patients: BloggerCourse.com  Fact Sheet for Healthcare  Providers: SeriousBroker.it  This test is not yet approved or cleared by the [place] FDA and has been authorized for detection and/or diagnosis of SARS-CoV-2 by FDA under an Emergency Use Authorization (EUA). This EUA will remain in effect (meaning this test can be used) for the duration of the COVID-19 declaration under Section 564(b)(1) of the Act, 21 U.S.C. section 360bbb-3(b)(1), unless the authorization is terminated or revoked.  Performed at [facility], [address], [place], [place] [address]   Body fluid culture w Gram Stain     Status: None   Collection Time: 06/23/21 11:10 AM   Specimen: Lung, Left; Pleural Fluid  Result Value Ref Range Status   Specimen Description PLEURAL FLUID  Final   Special Requests LEFT LUNG  Final   Gram Stain   Final    MODERATE WBC PRESENT, PREDOMINANTLY MONONUCLEAR NO ORGANISMS SEEN    Culture   Final    NO GROWTH Performed at [facility], [address], [place], [place] [address]    Report Status 06/26/2021 FINAL  Final  Aerobic/Anaerobic Culture w Gram Stain (surgical/deep wound)     Status: None (Preliminary result)   Collection Time: 07/01/21  8:46 AM   Specimen: PATH Cytology Pleural fluid; Body Fluid  Result Value Ref Range Status   Specimen Description PLEURAL FLUID  Final   Special Requests LEFT PLEURAL FLUID SPEC A  Final   Gram Stain   Final    MODERATE WBC PRESENT,BOTH PMN AND MONONUCLEAR NO ORGANISMS SEEN Performed at [facility], [address], [place], [place] [address]    Culture PENDING  Incomplete   Report Status PENDING  Incomplete      Radiology Studies: DG Chest Port 1 View  Result Date: 07/01/2021 CLINICAL DATA:  Shortness of breath. EXAM: PORTABLE CHEST 1 VIEW COMPARISON:  Chest x-ray 06/29/2021. FINDINGS: There is a new left-sided chest tube with distal tip near the apex. Small left apical pneumothorax persists measuring 2.4 cm from the apex. Compared  to the prior examination, pneumothorax has decreased. Small left pleural effusion persists. There is no mediastinal shift. The cardiomediastinal silhouette is prominent in size and unchanged. Patient is status post cardiac surgery. Patchy airspace opacities in the left lung base are stable from the prior study. Calcified nodular density in the right lung base is unchanged from the prior study. The osseous structures are unchanged. IMPRESSION: 1. New left chest tube in place. 2. Small left pneumothorax has decreased in size, but persists. 3. Small left pleural effusion. 4.  Stable left basilar atelectasis/airspace disease. Electronically Signed   By: Darliss Cheney M.D.   On: 07/01/2021 14:46     Huey Bienenstock MD [facility]  Between 7 am - 7 pm I am available, please contact me via Amion (for emergencies) or Securechat (non urgent messages)  Between 7 pm - 7 am I am not available, please contact night coverage MD/APP via Amion

## 2021-07-01 NOTE — Anesthesia Procedure Notes (Signed)
Procedure Name: Intubation Date/Time: 07/01/2021 7:37 AM Performed by: Lynnell Chad, CRNA Pre-anesthesia Checklist: Patient identified, Emergency Drugs available, Suction available and Patient being monitored Patient Re-evaluated:Patient Re-evaluated prior to induction Oxygen Delivery Method: Circle System Utilized Preoxygenation: Pre-oxygenation with 100% oxygen Induction Type: IV induction Ventilation: Mask ventilation without difficulty Laryngoscope Size: Miller and 3 Grade View: Grade I Tube type: Oral Endobronchial tube: Left and 41 Fr Number of attempts: 1 Airway Equipment and Method: Stylet and Oral airway Placement Confirmation: ETT inserted through vocal cords under direct vision, positive ETCO2 and breath sounds checked- equal and bilateral Tube secured with: Tape Dental Injury: Teeth and Oropharynx as per pre-operative assessment

## 2021-07-01 NOTE — Interval H&P Note (Signed)
History and Physical Interval Note:  07/01/2021 7:06 AM  [name] Rice  has presented today for surgery, with the diagnosis of LEFT EFFUSION.  The various methods of treatment have been discussed with the patient and family. After consideration of risks, benefits and other options for treatment, the patient has consented to  Procedure(s): XI ROBOTIC ASSISTED THORASCOPY FOR DECORTICATION (Left) as a surgical intervention.  The patient's history has been reviewed, patient examined, no change in status, stable for surgery.  I have reviewed the patient's chart and labs.  Questions were answered to the patient's satisfaction.     Loreli Slot

## 2021-07-01 NOTE — Anesthesia Preprocedure Evaluation (Signed)
Anesthesia Evaluation  Patient identified by MRN, date of birth, ID band Patient awake    Reviewed: Allergy & Precautions, NPO status , Patient's Chart, lab work & pertinent test results  History of Anesthesia Complications Negative for: history of anesthetic complications  Airway Mallampati: I  TM Distance: >3 FB Neck ROM: Full    Dental  (+) Poor Dentition, Missing, Loose, Chipped, Dental Advisory Given   Pulmonary shortness of breath,  LEFT PLEURAL EFFUSION    + decreased breath sounds      Cardiovascular hypertension, Pt. on medications and Pt. on home beta blockers + CAD and + CABG  + dysrhythmias Atrial Fibrillation  Rhythm:Irregular Rate:Normal     Neuro/Psych negative neurological ROS  negative psych ROS   GI/Hepatic negative GI ROS, Neg liver ROS,   Endo/Other  negative endocrine ROS  Renal/GU Renal diseaseLab Results      Component                Value               Date                      CREATININE               1.01                07/01/2021                Musculoskeletal negative musculoskeletal ROS (+)   Abdominal Normal abdominal exam  (+)   Peds  Hematology  (+) Blood dyscrasia, anemia , Lab Results      Component                Value               Date                      WBC                      6.5                 07/01/2021                HGB                      9.4 (L)             07/01/2021                HCT                      30.8 (L)            07/01/2021                MCV                      83.7                07/01/2021                PLT                      379                 07/01/2021  Anesthesia Other Findings   Reproductive/Obstetrics                             Anesthesia Physical Anesthesia Plan  ASA: 3  Anesthesia Plan: General   Post-op Pain Management:    Induction: Intravenous  PONV Risk Score and Plan: 2 and  Ondansetron and Dexamethasone  Airway Management Planned: Double Lumen EBT  Additional Equipment: Arterial line  Intra-op Plan:   Post-operative Plan: Extubation in OR  Informed Consent: I have reviewed the patients History and Physical, chart, labs and discussed the procedure including the risks, benefits and alternatives for the proposed anesthesia with the patient or authorized representative who has indicated his/her understanding and acceptance.     Dental advisory given  Plan Discussed with: CRNA and Anesthesiologist  Anesthesia Plan Comments:         Anesthesia Quick Evaluation

## 2021-07-01 NOTE — Plan of Care (Signed)
  Problem: Pain Managment: Goal: General experience of comfort will improve Outcome: Not Progressing   

## 2021-07-01 NOTE — Transfer of Care (Signed)
Immediate Anesthesia Transfer of Care Note  Patient: [name] Rice  Procedure(s) Performed: XI ROBOTIC ASSISTED THORASCOPY FOR DECORTICATION (Left: Chest)  Patient Location: PACU  Anesthesia Type:General  Level of Consciousness: drowsy and patient cooperative  Airway & Oxygen Therapy: Patient Spontanous Breathing  Post-op Assessment: Report given to RN and Post -op Vital signs reviewed and stable  Post vital signs: Reviewed and stable  Last Vitals:  Vitals Value Taken Time  BP 90/74 07/01/21 1123  Temp    Pulse 90 07/01/21 1125  Resp 14 07/01/21 1125  SpO2 95 % 07/01/21 1125  Vitals shown include unvalidated device data.  Last Pain:  Vitals:   07/01/21 0611  TempSrc: Oral  PainSc:       Patients Stated Pain Goal: 0 (06/30/21 1924)  Complications: No notable events documented.

## 2021-07-01 NOTE — Anesthesia Procedure Notes (Signed)
Arterial Line Insertion Start/End8/24/2022 7:40 AM, 07/01/2021 7:55 AM Performed by: Val Eagle, MD, CRNA  Left, radial was placed Catheter size: 20 G  Attempts: 2 Procedure performed without using ultrasound guided technique. Following insertion, Biopatch and dressing applied. Post procedure assessment: normal  Patient tolerated the procedure well with no immediate complications.

## 2021-07-01 NOTE — Brief Op Note (Addendum)
06/22/2021 - 07/01/2021  10:55 AM  PATIENT:  [name] Rice  70 y.o. male  PRE-OPERATIVE DIAGNOSIS:  LEFT PLEURAL EFFUSION  POST-OPERATIVE DIAGNOSIS:  LEFT PLEURAL EFFUSION  PROCEDURE:  Procedure(s):  XI ROBOTIC ASSISTED THORASCOPY (Left) -Drainage of Pleural Effusion -Decortication  SURGEON:  Surgeon(s) and Role:    Loreli Slot, MD - Primary  PHYSICIAN ASSISTANT: Lowella Dandy PA-C  ASSISTANTS: none   ANESTHESIA:   general  EBL:  200 mL   BLOOD ADMINISTERED:none  DRAINS:  28 Blake Drain x 2    LOCAL MEDICATIONS USED:  BUPIVICAINE   SPECIMEN:  Source of Specimen:  Pleural Fluid, Pleural Peel  DISPOSITION OF SPECIMEN:   Pathology, Microbiology  COUNTS:  YES  TOURNIQUET:  * No tourniquets in log *  DICTATION: .Dragon Dictation  PLAN OF CARE:  Patient has active in patient order  PATIENT DISPOSITION:  PACU - hemodynamically stable.   Delay start of Pharmacological VTE agent (>24hrs) due to surgical blood loss or risk of bleeding: no

## 2021-07-02 ENCOUNTER — Inpatient Hospital Stay (HOSPITAL_COMMUNITY): Payer: Medicare Other

## 2021-07-02 DIAGNOSIS — E871 Hypo-osmolality and hyponatremia: Secondary | ICD-10-CM

## 2021-07-02 LAB — BASIC METABOLIC PANEL
Anion gap: 7 (ref 5–15)
BUN: 8 mg/dL (ref 8–23)
CO2: 27 mmol/L (ref 22–32)
Calcium: 8.1 mg/dL — ABNORMAL LOW (ref 8.9–10.3)
Chloride: 97 mmol/L — ABNORMAL LOW (ref 98–111)
Creatinine, Ser: 0.96 mg/dL (ref 0.61–1.24)
GFR, Estimated: >60 mL/min (ref >60)
Glucose, Bld: 96 mg/dL (ref 70–99)
Potassium: 4.2 mmol/L (ref 3.5–5.1)
Sodium: 131 mmol/L — ABNORMAL LOW (ref 135–145)

## 2021-07-02 LAB — CBC
HCT: 29.9 % — ABNORMAL LOW (ref 39.0–52.0)
Hemoglobin: 9.1 g/dL — ABNORMAL LOW (ref 13.0–17.0)
MCH: 25.6 pg — ABNORMAL LOW (ref 26.0–34.0)
MCHC: 30.4 g/dL (ref 30.0–36.0)
MCV: 84.2 fL (ref 80.0–100.0)
Platelets: 321 10*3/uL (ref 150–400)
RBC: 3.55 MIL/uL — ABNORMAL LOW (ref 4.22–5.81)
RDW: 14.6 % (ref 11.5–15.5)
WBC: 9.4 10*3/uL (ref 4.0–10.5)
nRBC: 0 % (ref 0.0–0.2)

## 2021-07-02 LAB — ACID FAST SMEAR (AFB, MYCOBACTERIA): Acid Fast Smear: NEGATIVE

## 2021-07-02 MED ORDER — SODIUM CHLORIDE 0.9 % IV SOLN: INTRAVENOUS | Status: AC

## 2021-07-02 NOTE — Progress Notes (Signed)
Physical Therapy Treatment Patient Details Name: [name] Rice MRN: [id number] DOB: July 06, 1951 Today's Date: 07/02/2021    History of Present Illness 70 y.o. male presents to [facility] hospital on 06/22/2021 with ongoing SOB. Pt found to have large L sided pleural effusion. Pt underwent thoracentesis on 8/16. 8/24 VATS. PMH includes anemia, A. fib, hypertension, cholecystitis status post cholecystectomy, CKD 3, CAD, Gill Bears syndrome, hyperlipidemia, left bundle branch block.    PT Comments    Pt limited by fatigue and pain during session with functional decline given recent surgery. Pt demonstrated increased time with incontinent BM for transfer to EOB and required assist for pericare. Pt demonstrated difficulty with getting EOB and reported dizziness (see below). Also, demonstrated decreased ambulation distance with RW compared to previous sessions. Updated recommendation to HHPT given current functional status and will update based on progression.   Pre-vitals - HR 72, spO2 95% on RA, BP 116/52(71)  EOB - BP 132/61(78), HR 80-103   Follow Up Recommendations  Home health PT     Equipment Recommendations  None recommended by PT (pt has equipment at home)    Recommendations for Other Services       Precautions / Restrictions      Mobility  Bed Mobility Overal bed mobility: Needs Assistance Bed Mobility: Supine to Sit     Supine to sit: Min guard;HOB elevated     General bed mobility comments: Min guard, as pt had difficulty with supine to sit. Performed with increased time    Transfers Overall transfer level: Needs assistance Equipment used: Rolling walker (2 wheeled) Transfers: Sit to/from [employer] Sit to Stand: Min guard Stand pivot transfers: Min guard       General transfer comment: Sit to stand x4 (1 from bed, 1 from [facility], and 2 from recliner). 1st trial without RW, 3 trials following with RW. Min guard for safety. Performed with increased time.  Verbal cues for hand placement on chair.  Ambulation/Gait Ambulation/Gait assistance: Min guard;+2 safety/equipment Gait Distance (Feet): 60 Feet Assistive device: Rolling walker (2 wheeled) Gait Pattern/deviations: Step-through pattern;Decreased stride length Gait velocity: Decreased Gait velocity interpretation: 1.31 - 2.62 ft/sec, indicative of limited community ambulator General Gait Details: Min guard for safety +2 for chair follow. Ambulated 1ft prior to requiring seated rest, pain and fatigue limited. Then ambulated an additional 39ft back to room. Fatigues quickly   Social research officer, government Rankin (Stroke Patients Only)       Balance Overall balance assessment: Needs assistance Sitting-balance support: Feet supported Sitting balance-Leahy Scale: Good     Standing balance support: Bilateral upper extremity supported;During functional activity Standing balance-Leahy Scale: Fair Standing balance comment: Required BUE for dynamic standing balance                            Cognition Arousal/Alertness: Awake/alert Behavior During Therapy: WFL for tasks assessed/performed Overall Cognitive Status: Within Functional Limits for tasks assessed                                        Exercises      General Comments General comments (skin integrity, edema, etc.): Pt reported dizziness seated EOB, BP 132/61(78), HR 80-103. spO2 mid-90s on RA throughout session.      Pertinent Vitals/Pain Faces  Pain Scale: Hurts little more Pain Location: L side at chest tube insertion Pain Descriptors / Indicators: Sore;Discomfort;Grimacing Pain Intervention(s): Monitored during session;Limited activity within patient's tolerance;Repositioned    Home Living                      Prior Function            PT Goals (current goals can now be found in the care plan section) Acute Rehab PT Goals Patient Stated Goal:  to return home PT Goal Formulation: With patient Time For Goal Achievement: 07/08/21 Potential to Achieve Goals: Good Additional Goals Additional Goal #1: Pt will report 2/10 DOE or less when ambulating for >250' to demonstrate improvements in cardiopulmonary endurance Additional Goal #2: Pt will score >19/24 on DGI to indicate a reduced risk for falls Progress towards PT goals: Not progressing toward goals - comment (Decline in functional status given recent surgery)    Frequency    Min 3X/week      PT Plan Discharge plan needs to be updated    Co-evaluation              AM-PAC PT "6 Clicks" Mobility   Outcome Measure  Help needed turning from your back to your side while in a flat bed without using bedrails?: None Help needed moving from lying on your back to sitting on the side of a flat bed without using bedrails?: None Help needed moving to and from a bed to a chair (including a wheelchair)?: A Little Help needed standing up from a chair using your arms (e.g., wheelchair or bedside chair)?: A Little Help needed to walk in hospital room?: A Little Help needed climbing 3-5 steps with a railing? : A Lot 6 Click Score: 19    End of Session   Activity Tolerance: Patient limited by fatigue;Patient limited by pain Patient left: in chair;with call bell/phone within reach Nurse Communication: Mobility status PT Visit Diagnosis: Unsteadiness on feet (R26.81);Other abnormalities of gait and mobility (R26.89)     Time: 8756-4332 PT Time Calculation (min) (ACUTE ONLY): 42 min  Charges:  $Gait Training: 8-22 mins $Therapeutic Activity: 23-37 mins                     Velda Shell, SPT Acute Rehab: [phone]      Vance Gather 07/02/2021, 10:17 AM

## 2021-07-02 NOTE — Progress Notes (Addendum)
      [address]       [name] [address]             [phone]      1 Day Post-Op Procedure(s) (LRB): XI ROBOTIC ASSISTED THORASCOPY FOR DECORTICATION (Left)  Subjective: Patient doing better this morning.  He states yesterday he felt like he had a "blade" in his side.  Hasn't yet ambulated.  No BM, patient states he been trying to avoid as he doesn't want to be put on a bedpan.  Told patient nursing can take him into the bathroom.  + productive cough, clearing a lot of phlegm  Objective: Vital signs in last 24 hours: Temp:  [97.5 F (36.4 C)-97.8 F (36.6 C)] 97.8 F (36.6 C) (08/25 0325) Pulse Rate:  [34-87] 75 (08/25 0325) Cardiac Rhythm: Atrial fibrillation;Atrial flutter;Bundle branch block (08/25 0708) Resp:  [15-33] 20 (08/25 0325) BP: (89-136)/(39-94) 120/65 (08/25 0325) SpO2:  [94 %-100 %] 98 % (08/25 0325) Arterial Line BP: (86-118)/(42-55) 97/46 (08/24 1350) Weight:  [92.2 kg] 92.2 kg (08/25 0325)  Intake/Output from previous day: 08/24 0701 - 08/25 0700 In: 2307.2 [I.V.:2007.2; IV Piggyback:300] Out: 1200 [Urine:400; Blood:200; Chest Tube:600]  General appearance: alert, cooperative, and no distress Heart: regular rate and rhythm Lungs: coarse clears with cough Abdomen: soft, non-tender; bowel sounds normal; no masses,  no organomegaly Wound: clean and dry  Lab Results: Recent Labs    07/01/21 1400 07/02/21 0039  WBC 11.7* 9.4  HGB 8.5* 9.1*  HCT 28.3* 29.9*  PLT 332 321   BMET:  Recent Labs    07/01/21 0258 07/02/21 0039  NA 133* 131*  K 4.3 4.2  CL 97* 97*  CO2 29 27  GLUCOSE 93 96  BUN 7* 8  CREATININE 1.01 0.96  CALCIUM 8.3* 8.1*    PT/INR: No results for input(s): LABPROT, INR in the last 72 hours. ABG    Component Value Date/Time   PHART 7.327 (L) 07/29/2020 2140   HCO3 18.6 (L) 07/29/2020 2140   TCO2 20 (L) 07/29/2020 2140   ACIDBASEDEF 7.0 (H) 07/29/2020 2140   O2SAT 96.0 07/29/2020 2140   CBG (last 3)   No results for input(s): GLUCAP in the last 72 hours.  Assessment/Plan: S/P Procedure(s) (LRB): XI ROBOTIC ASSISTED THORASCOPY FOR DECORTICATION (Left)  POD #1 from drainage of pleural effusion, decortication... no air leak present, CT output was 600 cc since surgery, will place chest tube to water seal today Pulm- wean oxygen as tolerated, placed order for IS, encouraged deep coughing Dispo- patient stable from surgical standpoint POD #1, patient needs aggressive pulmonary toilet, IS has been ordered, chest tube to water seal, care per primary   LOS: 9 days    Lowella Dandy, PA-C 07/02/2021  Patient seen and examined, agree with above CXR OK Pain better  Viviann Spare C. Dorris Fetch, MD [facility] [phone]

## 2021-07-02 NOTE — Progress Notes (Signed)
PROGRESS NOTE  [name] Rice [id number] DOB: 1951/04/26 DOA: 06/22/2021 PCP: Richmond CampbellKaplan, Kristen W., PA-C   LOS: 9 days   Brief Narrative / Interim history:  [name] Rice is a 70 y.o. male with medical history significant of anemia, A. fib, hypertension, cholecystitis and choledocholithiasis status post cholecystectomy, CKD 3, CAD s/p CABG, gilberts syndrome, hyperlipidemia, left bundle branch block who presents with ongoing shortness of breath.  He was found to have a large L sided effusion.  Admitted for management.  He had a left-sided effusion last year as well in September during his admission for laparoscopic cholecystectomy.  Pulmonary was consulted and he briefly had a chest tube which was removed 8/21.  Thoracic surgery consulted as well and patient went for robotic decortication on 8/24  Subjective / 24h Interval events:  Reports is feeling better today, reports cough, with productive phlegm.  Pain is controlled.   Assessment & Plan:  Recurrent left-sided pleural effusion -patient here with a loculated, exudative left-sided pleural effusion.  Pulmonology consulted and had a chest tube placed 8/17 and removed 8/21, and a CT scan showed a trapped lung likely suggesting longstanding effusion.   - Thoracic surgery also consulted s/p robotic decortication, follow on postoperative cultures, so far no growth. -Chest tube management per CT surgery, on water seal today. -Continue with IV Rocephin and Flagyl.  Acute hypoxic respiratory failure-resolved, he remains on room air, he was encouraged to use incentive spirometer  History of A. Fib -patient had few episodes of intermittent bradycardia and metoprolol has been discontinued on 8/22.  Anticoagulation is on hold.  Will discuss with CT surgery when  is safe to resume  Essential hypertension-continue metoprolol  Hyponatremia -We will change half-normal saline to normal saline, recheck BMP in a.m.  CAD, status post CABG 2021-no  chest pain, stable  Hyperlipidemia-continue statin  Normocytic anemia-of chronic disease  Will discontinue Foley catheter today.   Scheduled Meds:  acetaminophen  1,000 mg Oral Q6H   Or   acetaminophen (TYLENOL) oral liquid 160 mg/5 mL  1,000 mg Oral Q6H   bisacodyl  10 mg Oral Daily   Chlorhexidine Gluconate Cloth  6 each Topical Daily   metoCLOPramide (REGLAN) injection  10 mg Intravenous Q6H   rosuvastatin  20 mg Oral Daily   senna-docusate  1 tablet Oral QHS   sodium chloride flush  3 mL Intravenous Q12H   traZODone  50 mg Oral QHS   Continuous Infusions:  sodium chloride     cefTRIAXone (ROCEPHIN)  IV 1 g (07/01/21 1917)   metronidazole 500 mg (07/02/21 0636)   PRN Meds:.acetaminophen **OR** acetaminophen, fentaNYL (SUBLIMAZE) injection, morphine injection, ondansetron (ZOFRAN) IV, oxyCODONE, polyethylene glycol, traMADol     DVT prophylaxis: SCD's Start: 07/01/21 1726 Place and maintain sequential compression device Start: 06/23/21 1852     Code Status: Full Code  Family Communication: Discussed with brother at bedside  Status is: Inpatient  Remains inpatient appropriate because:Inpatient level of care appropriate due to severity of illness  Dispo: The patient is from: Home              Anticipated d/c is to: Home              Patient currently is not medically stable to d/c.   Difficult to place patient No  Level of care: Progressive  Consultants:  PCCM CT surgery  Procedures:   XI ROBOTIC ASSISTED THORASCOPY (Left) Dr. Dorris FetchHendrickson 8/24 -Drainage of Pleural Effusion -Decortication  Microbiology  none  Antimicrobials: none    Objective: Vitals:   07/01/21 1733 07/01/21 1937 07/02/21 0004 07/02/21 0325  BP: 128/73 (!) 101/54 123/64 120/65  Pulse: 76 71 70 75  Resp: (!) 22 20 20 20   Temp:  97.7 F (36.5 C) 97.7 F (36.5 C) 97.8 F (36.6 C)  TempSrc: Oral Oral Oral Oral  SpO2: 99% 97% 95% 98%  Weight:    92.2 kg  Height:         Intake/Output Summary (Last 24 hours) at 07/02/2021 1050 Last data filed at 07/02/2021 07/04/2021 Gross per 24 hour  Intake 1007.18 ml  Output 950 ml  Net 57.18 ml   Filed Weights   06/28/21 0449 06/29/21 0418 07/02/21 0325  Weight: 91.1 kg 91.1 kg 92.2 kg    Examination:  Awake Alert, Oriented X 3,frail, deconditioned, appears older than his stated age . symmetrical Chest wall movement, Good air movement bilaterally, scattered rhonchi in left lung RRR,No Gallops,Rubs or new Murmurs, No Parasternal Heave +ve B.Sounds, Abd Soft, No tenderness, No rebound - guarding or rigidity. No Cyanosis, Clubbing or edema, No new Rash or bruise     Data Reviewed: I have independently reviewed following labs and imaging studies   CBC: Recent Labs  Lab 06/27/21 0122 06/30/21 0134 07/01/21 0258 07/01/21 1400 07/02/21 0039  WBC 6.4 7.1 6.5 11.7* 9.4  HGB 9.1* 9.4* 9.4* 8.5* 9.1*  HCT 29.5* 30.7* 30.8* 28.3* 29.9*  MCV 83.3 83.2 83.7 85.0 84.2  PLT 352 368 379 332 321   Basic Metabolic Panel: Recent Labs  Lab 06/26/21 0208 06/27/21 0122 06/30/21 0134 07/01/21 0258 07/02/21 0039  NA 134* 132* 134* 133* 131*  K 3.8 3.8 4.4 4.3 4.2  CL 99 99 98 97* 97*  CO2 28 27 29 29 27   GLUCOSE 97 95 95 93 96  BUN 13 13 9  7* 8  CREATININE 1.00 0.97 0.94 1.01 0.96  CALCIUM 8.5* 8.2* 8.4* 8.3* 8.1*   Liver Function Tests: Recent Labs  Lab 06/27/21 0122 06/30/21 0134 07/01/21 0258  AST 13* 13* 13*  ALT 8 9 7   ALKPHOS 88 86 86  BILITOT 0.8 0.8 1.0  PROT 5.8* 5.7* 5.8*  ALBUMIN 2.0* 2.0* 2.1*   Coagulation Profile: No results for input(s): INR, PROTIME in the last 168 hours.  HbA1C: No results for input(s): HGBA1C in the last 72 hours. CBG: No results for input(s): GLUCAP in the last 168 hours.  Recent Results (from the past 240 hour(s))  Resp Panel by RT-PCR (Flu A&B, Covid) Nasopharyngeal Swab     Status: None   Collection Time: 06/22/21  5:35 PM   Specimen: Nasopharyngeal Swab;  Nasopharyngeal(NP) swabs in vial transport medium  Result Value Ref Range Status   SARS Coronavirus 2 by RT PCR NEGATIVE NEGATIVE Final    Comment: (NOTE) SARS-CoV-2 target nucleic acids are NOT DETECTED.  The SARS-CoV-2 RNA is generally detectable in upper respiratory specimens during the acute phase of infection. The lowest concentration of SARS-CoV-2 viral copies this assay can detect is 138 copies/mL. A negative result does not preclude SARS-Cov-2 infection and should not be used as the sole basis for treatment or other patient management decisions. A negative result may occur with  improper specimen collection/handling, submission of specimen other than nasopharyngeal swab, presence of viral mutation(s) within the areas targeted by this assay, and inadequate number of viral copies(<138 copies/mL). A negative result must be combined with clinical observations, patient history, and epidemiological information. The expected result is Negative.  Fact Sheet for Patients:  BloggerCourse.com  Fact Sheet for Healthcare Providers:  SeriousBroker.it  This test is no t yet approved or cleared by the [place] FDA and  has been authorized for detection and/or diagnosis of SARS-CoV-2 by FDA under an Emergency Use Authorization (EUA). This EUA will remain  in effect (meaning this test can be used) for the duration of the COVID-19 declaration under Section 564(b)(1) of the Act, 21 U.S.C.section 360bbb-3(b)(1), unless the authorization is terminated  or revoked sooner.       Influenza A by PCR NEGATIVE NEGATIVE Final   Influenza B by PCR NEGATIVE NEGATIVE Final    Comment: (NOTE) The Xpert Xpress SARS-CoV-2/FLU/RSV plus assay is intended as an aid in the diagnosis of influenza from Nasopharyngeal swab specimens and should not be used as a sole basis for treatment. Nasal washings and aspirates are unacceptable for Xpert Xpress  SARS-CoV-2/FLU/RSV testing.  Fact Sheet for Patients: BloggerCourse.com  Fact Sheet for Healthcare Providers: SeriousBroker.it  This test is not yet approved or cleared by the [place] FDA and has been authorized for detection and/or diagnosis of SARS-CoV-2 by FDA under an Emergency Use Authorization (EUA). This EUA will remain in effect (meaning this test can be used) for the duration of the COVID-19 declaration under Section 564(b)(1) of the Act, 21 U.S.C. section 360bbb-3(b)(1), unless the authorization is terminated or revoked.  Performed at [facility], [address], [place], [place] [address]   Body fluid culture w Gram Stain     Status: None   Collection Time: 06/23/21 11:10 AM   Specimen: Lung, Left; Pleural Fluid  Result Value Ref Range Status   Specimen Description PLEURAL FLUID  Final   Special Requests LEFT LUNG  Final   Gram Stain   Final    MODERATE WBC PRESENT, PREDOMINANTLY MONONUCLEAR NO ORGANISMS SEEN    Culture   Final    NO GROWTH Performed at [facility], [address], [place], [place] [address]    Report Status 06/26/2021 FINAL  Final  Aerobic/Anaerobic Culture w Gram Stain (surgical/deep wound)     Status: None (Preliminary result)   Collection Time: 07/01/21  8:46 AM   Specimen: PATH Cytology Pleural fluid; Body Fluid  Result Value Ref Range Status   Specimen Description PLEURAL FLUID  Final   Special Requests LEFT PLEURAL FLUID SPEC A  Final   Gram Stain   Final    MODERATE WBC PRESENT,BOTH PMN AND MONONUCLEAR NO ORGANISMS SEEN    Culture   Final    NO GROWTH < 24 HOURS Performed at [facility], [address] [place], [place] [address]    Report Status PENDING  Incomplete      Radiology Studies: DG CHEST PORT 1 VIEW  Result Date: 07/02/2021 CLINICAL DATA:  Chest tube EXAM: PORTABLE CHEST 1 VIEW COMPARISON:  07/01/2021 FINDINGS: Left chest tube remains in  place. Left pleural thickening/fluid unchanged. Left lower lobe airspace disease unchanged. Small pneumothorax has resolved on the left. Soft tissue gas in the left lower neck unchanged. Open heart surgery changes. Right lung clear. No heart failure or edema. IMPRESSION: Left chest tube remains in place.  No pneumothorax. Electronically Signed   By: Marlan Palau M.D.   On: 07/02/2021 09:28   DG Chest Port 1 View  Result Date: 07/01/2021 CLINICAL DATA:  Shortness of breath. EXAM: PORTABLE CHEST 1 VIEW COMPARISON:  Chest x-ray 06/29/2021. FINDINGS: There is a new left-sided chest tube  with distal tip near the apex. Small left apical pneumothorax persists measuring 2.4 cm from the apex. Compared to the prior examination, pneumothorax has decreased. Small left pleural effusion persists. There is no mediastinal shift. The cardiomediastinal silhouette is prominent in size and unchanged. Patient is status post cardiac surgery. Patchy airspace opacities in the left lung base are stable from the prior study. Calcified nodular density in the right lung base is unchanged from the prior study. The osseous structures are unchanged. IMPRESSION: 1. New left chest tube in place. 2. Small left pneumothorax has decreased in size, but persists. 3. Small left pleural effusion. 4. Stable left basilar atelectasis/airspace disease. Electronically Signed   By: Darliss Cheney M.D.   On: 07/01/2021 14:46     Huey Bienenstock MD [facility]  Between 7 am - 7 pm I am available, please contact me via Amion (for emergencies) or Securechat (non urgent messages)  Between 7 pm - 7 am I am not available, please contact night coverage MD/APP via Amion

## 2021-07-03 ENCOUNTER — Inpatient Hospital Stay (HOSPITAL_COMMUNITY): Payer: Medicare Other

## 2021-07-03 LAB — COMPREHENSIVE METABOLIC PANEL
ALT: 8 U/L (ref 0–44)
AST: 12 U/L — ABNORMAL LOW (ref 15–41)
Albumin: 2 g/dL — ABNORMAL LOW (ref 3.5–5.0)
Alkaline Phosphatase: 73 U/L (ref 38–126)
Anion gap: 5 (ref 5–15)
BUN: 8 mg/dL (ref 8–23)
CO2: 27 mmol/L (ref 22–32)
Calcium: 7.9 mg/dL — ABNORMAL LOW (ref 8.9–10.3)
Chloride: 99 mmol/L (ref 98–111)
Creatinine, Ser: 0.84 mg/dL (ref 0.61–1.24)
GFR, Estimated: >60 mL/min (ref >60)
Glucose, Bld: 90 mg/dL (ref 70–99)
Potassium: 3.7 mmol/L (ref 3.5–5.1)
Sodium: 131 mmol/L — ABNORMAL LOW (ref 135–145)
Total Bilirubin: 0.9 mg/dL (ref 0.3–1.2)
Total Protein: 5.4 g/dL — ABNORMAL LOW (ref 6.5–8.1)

## 2021-07-03 LAB — CBC
HCT: 29 % — ABNORMAL LOW (ref 39.0–52.0)
Hemoglobin: 8.9 g/dL — ABNORMAL LOW (ref 13.0–17.0)
MCH: 25.5 pg — ABNORMAL LOW (ref 26.0–34.0)
MCHC: 30.7 g/dL (ref 30.0–36.0)
MCV: 83.1 fL (ref 80.0–100.0)
Platelets: 340 10*3/uL (ref 150–400)
RBC: 3.49 MIL/uL — ABNORMAL LOW (ref 4.22–5.81)
RDW: 14.7 % (ref 11.5–15.5)
WBC: 8.8 10*3/uL (ref 4.0–10.5)
nRBC: 0 % (ref 0.0–0.2)

## 2021-07-03 MED ORDER — ENOXAPARIN SODIUM 40 MG/0.4ML IJ SOSY
40.0000 mg | PREFILLED_SYRINGE | Freq: Every day | INTRAMUSCULAR | Status: DC
  Administered 2021-07-03 – 2021-07-04 (×2): 40 mg via SUBCUTANEOUS
  Filled 2021-07-03 (×2): qty 0.4

## 2021-07-03 NOTE — Progress Notes (Signed)
PROGRESS NOTE  TAFT WORTHING WLS:[id number] DOB: 04-May-1951 DOA: 06/22/2021 PCP: [name], PA-C   LOS: 10 days   Brief Narrative / Interim history:  [name] Rice is a 70 y.o. male with medical history significant of anemia, A. fib, hypertension, cholecystitis and choledocholithiasis status post cholecystectomy, CKD 3, CAD s/p CABG, gilberts syndrome, hyperlipidemia, left bundle branch block who presents with ongoing shortness of breath.  He was found to have a large L sided effusion.  Admitted for management.  He had a left-sided effusion last year as well in September during his admission for laparoscopic cholecystectomy.  Pulmonary was consulted and he briefly had a chest tube which was removed 8/21.  Thoracic surgery consulted as well and patient went for robotic decortication on 8/24  Subjective / 24h Interval events:  Pain is controlled, no shortness of breath.   Assessment & Plan:  Recurrent left-sided pleural effusion -patient here with a loculated, exudative left-sided pleural effusion.  Pulmonology consulted and had a chest tube placed 8/17 and removed 8/21, and a CT scan showed a trapped lung likely suggesting longstanding effusion.   - Thoracic surgery also consulted s/p robotic decortication, follow on postoperative cultures, so far no growth. -Chest tube management per CT surgery, posterior chest tube will be discontinued today.  Underwent to prevent pulling on, management per CT surgery. -Continue with IV Rocephin and Flagyl.  gram stain and Juliane Juliane negative.  Acute hypoxic respiratory failure-resolved, he remains on room air, he was encouraged to use incentive spirometer  History of A. Fib -patient had few episodes of intermittent bradycardia and metoprolol has been discontinued on 8/22.  Anticoagulation is on hold.  Can be resumed after chest symptoms are discontinued per CT surgery  Essential hypertension-continue metoprolol  Hyponatremia -stable ,  asymptomatic  CAD, status post CABG 2021-no chest pain, stable  Hyperlipidemia-continue statin  Normocytic anemia-of chronic disease  DC Foley catheter 8/25 .   Scheduled Meds:  acetaminophen  1,000 mg Oral Q6H   Or   acetaminophen (TYLENOL) oral liquid 160 mg/5 mL  1,000 mg Oral Q6H   bisacodyl  10 mg Oral Daily   Chlorhexidine Gluconate Cloth  6 each Topical Daily   enoxaparin (LOVENOX) injection  40 mg Subcutaneous Daily   rosuvastatin  20 mg Oral Daily   senna-docusate  1 tablet Oral QHS   sodium chloride flush  3 mL Intravenous Q12H   traZODone  50 mg Oral QHS   Continuous Infusions:  cefTRIAXone (ROCEPHIN)  IV 1 g (07/02/21 1708)   metronidazole 500 mg (07/03/21 0551)   PRN Meds:.fentaNYL (SUBLIMAZE) injection, morphine injection, ondansetron (ZOFRAN) IV, oxyCODONE, polyethylene glycol, traMADol  Diet Orders (From admission, onward)     Start     Ordered   07/02/21 1807  Diet Heart Room service appropriate? Yes; Fluid consistency: Thin  Diet effective now       Question Answer Comment  Room service appropriate? Yes   Fluid consistency: Thin      07/02/21 1806             DVT prophylaxis: enoxaparin (LOVENOX) injection 40 mg Start: 07/03/21 1000 SCD's Start: 07/01/21 1726 Place and maintain sequential compression device Start: 06/23/21 1852     Code Status: Full Code  Family Communication: none at bedside  Status is: Inpatient  Remains inpatient appropriate because:Inpatient level of care appropriate due to severity of illness  Dispo: The patient is from: Home  Anticipated d/c is to: Home              Patient currently is not medically stable to d/c.   Difficult to place patient No  Level of care: Progressive  Consultants:  PCCM CT surgery  Procedures:   XI ROBOTIC ASSISTED THORASCOPY (Left) Dr. Dorris Fetch 8/24 -Drainage of Pleural Effusion -Decortication  Microbiology  none  Antimicrobials: none     Objective: Vitals:   07/03/21 0350 07/03/21 0602 07/03/21 0848 07/03/21 1225  BP: 121/70  117/64 103/65  Pulse: 77  90   Resp: 18  17   Temp: 97.6 F (36.4 C)  97.9 F (36.6 C) 97.6 F (36.4 C)  TempSrc: Axillary  Oral Oral  SpO2: 98%  97% 96%  Weight:  89.9 kg    Height:        Intake/Output Summary (Last 24 hours) at 07/03/2021 1252 Last data filed at 07/03/2021 0553 Gross per 24 hour  Intake 360 ml  Output 761 ml  Net -401 ml   Filed Weights   06/29/21 0418 07/02/21 0325 07/03/21 0602  Weight: 91.1 kg 92.2 kg 89.9 kg    Examination:  Awake Alert, Oriented X 3, frail, deconditioned, appears older than stated age. Symmetrical Chest wall movement, Good air movement bilaterally, CTAB RRR,No Gallops,Rubs or new Murmurs, No Parasternal Heave +ve B.Sounds, Abd Soft, No tenderness, No rebound - guarding or rigidity. No Cyanosis, Clubbing or edema, No new Rash or bruise     Data Reviewed: I have independently reviewed following labs and imaging studies   CBC: Recent Labs  Lab 06/30/21 0134 07/01/21 0258 07/01/21 1400 07/02/21 0039 07/03/21 0136  WBC 7.1 6.5 11.7* 9.4 8.8  HGB 9.4* 9.4* 8.5* 9.1* 8.9*  HCT 30.7* 30.8* 28.3* 29.9* 29.0*  MCV 83.2 83.7 85.0 84.2 83.1  PLT 368 379 332 321 340   Basic Metabolic Panel: Recent Labs  Lab 06/27/21 0122 06/30/21 0134 07/01/21 0258 07/02/21 0039 07/03/21 0136  NA 132* 134* 133* 131* 131*  K 3.8 4.4 4.3 4.2 3.7  CL 99 98 97* 97* 99  CO2 27 29 29 27 27   GLUCOSE 95 95 93 96 90  BUN 13 9 7* 8 8  CREATININE 0.97 0.94 1.01 0.96 0.84  CALCIUM 8.2* 8.4* 8.3* 8.1* 7.9*   Liver Function Tests: Recent Labs  Lab 06/27/21 0122 06/30/21 0134 07/01/21 0258 07/03/21 0136  AST 13* 13* 13* 12*  ALT 8 9 7 8   ALKPHOS 88 86 86 73  BILITOT 0.8 0.8 1.0 0.9  PROT 5.8* 5.7* 5.8* 5.4*  ALBUMIN 2.0* 2.0* 2.1* 2.0*   Coagulation Profile: No results for input(s): INR, PROTIME in the last 168 hours.  HbA1C: No results for  input(s): HGBA1C in the last 72 hours. CBG: No results for input(s): GLUCAP in the last 168 hours.  Recent Results (from the past 240 hour(s))  Aerobic/Anaerobic Culture w Gram Stain (surgical/deep wound)     Status: None (Preliminary result)   Collection Time: 07/01/21  8:46 AM   Specimen: PATH Cytology Pleural fluid; Body Fluid  Result Value Ref Range Status   Specimen Description PLEURAL FLUID  Final   Special Requests LEFT PLEURAL FLUID SPEC A  Final   Gram Stain   Final    MODERATE WBC PRESENT,BOTH PMN AND MONONUCLEAR NO ORGANISMS SEEN    Culture   Final    NO GROWTH 2 DAYS Performed at [facility], [address] [place], [address] [place]  Report Status PENDING  Incomplete  Acid Fast Smear (AFB)     Status: None   Collection Time: 07/01/21  8:46 AM   Specimen: PATH Cytology Pleural fluid; Body Fluid  Result Value Ref Range Status   AFB Specimen Processing Concentration  Final   Acid Fast Smear Negative  Final    Comment: (NOTE) Performed At: [facility] [address] [place], [place] [id number] Jolene Schimke MD HK:[id number]    Source (AFB) LEFT PLEURAL FLUID SPEC A  Final    Comment: Performed at [facility], [address], [place], [place] [address]      Radiology Studies: DG CHEST PORT 1 VIEW  Result Date: 07/03/2021 CLINICAL DATA:  Chest tube present.  Pneumothorax. EXAM: PORTABLE CHEST 1 VIEW COMPARISON:  07/02/2021 FINDINGS: Stable position of the left chest tube near the left lung apex. Stable volume loss in left hemithorax. Persistent densities at the left lung base. Postsurgical changes involving the heart. Right lung remains clear. Persistent subcutaneous gas in the left chest and left neck. No definite pneumothorax. IMPRESSION: 1. Stable position of the left chest tube.  No pneumothorax. 2. Stable volume loss in left hemithorax. Persistent densities at the left lung base. Electronically Signed   By: Richarda Overlie M.D.   On: 07/03/2021  08:58     Gessica Jawad MD [facility]  Between 7 am - 7 pm I am available, please contact me via Amion (for emergencies) or Securechat (non urgent messages)  Between 7 pm - 7 am I am not available, please contact night coverage MD/APP via Amion

## 2021-07-03 NOTE — Progress Notes (Addendum)
      [address]       [name] [address]             [phone]      2 Days Post-Op Procedure(s) (LRB): XI ROBOTIC ASSISTED THORASCOPY FOR DECORTICATION (Left) Subjective: No new complaints.  Patient feels like he is doing better.  + Cough  Objective: Vital signs in last 24 hours: Temp:  [97.6 F (36.4 C)-97.8 F (36.6 C)] 97.6 F (36.4 C) (08/26 0350) Pulse Rate:  [74-83] 77 (08/26 0350) Cardiac Rhythm: Atrial fibrillation;Atrial flutter;Bundle branch block (08/26 0709) Resp:  [17-20] 18 (08/26 0350) BP: (116-127)/(57-80) 121/70 (08/26 0350) SpO2:  [94 %-99 %] 98 % (08/26 0350) Weight:  [89.9 kg] 89.9 kg (08/26 0602)  Intake/Output from previous day: 08/25 0701 - 08/26 0700 In: 360 [P.O.:360] Out: 811 [Urine:651; Chest Tube:160]  General appearance: alert, cooperative, and no distress Heart: regular rate and rhythm Lungs: clear to auscultation bilaterally Wound: clean and dry  Lab Results: Recent Labs    07/02/21 0039 07/03/21 0136  WBC 9.4 8.8  HGB 9.1* 8.9*  HCT 29.9* 29.0*  PLT 321 340   BMET:  Recent Labs    07/02/21 0039 07/03/21 0136  NA 131* 131*  K 4.2 3.7  CL 97* 99  CO2 27 27  GLUCOSE 96 90  BUN 8 8  CREATININE 0.96 0.84  CALCIUM 8.1* 7.9*    PT/INR: No results for input(s): LABPROT, INR in the last 72 hours. ABG    Component Value Date/Time   PHART 7.327 (L) 07/29/2020 2140   HCO3 18.6 (L) 07/29/2020 2140   TCO2 20 (L) 07/29/2020 2140   ACIDBASEDEF 7.0 (H) 07/29/2020 2140   O2SAT 96.0 07/29/2020 2140   CBG (last 3)  No results for input(s): GLUCAP in the last 72 hours.  Assessment/Plan: S/P Procedure(s) (LRB): XI ROBOTIC ASSISTED THORASCOPY FOR DECORTICATION (Left)  POD #2 from drainage of pleural effusion, decortication- no air leak present on water seal, 160 cc output from chest tube, CXR with stable pneumothorax, increased atelectasis.. IS ordered yesterday, but patient didn't receive.. told patient  importance of use once he receives device Pulm- wean oxygen as tolerated, awaiting IS Dispo- patient improving, CT w/o air leak on water seal, output 160 cc output yesterday, needs aggressive pulmonary toilet, instructed patient on IS use, just waiting on device, will defer to Dr. Dorris Fetch with possible CT removal today, care per primary   LOS: 10 days   Lowella Dandy, PA-C 07/03/2021  Patient seen and examined, agree with above Drainage down- will dc posterior chest tube Will start prophylactic dose enoxaparin Resume Xarelto once all tubes are out(probably tomorrow) DC planning- lives alone, went to SNF in [place], where his brother lives after his cholecystectomy Remains on ceftriaxone, day 9  Kjerstin Abrigo C. Dorris Fetch, MD [facility] [phone]

## 2021-07-03 NOTE — Progress Notes (Signed)
Physical Therapy Treatment Patient Details Name: [name] Rice MRN: [id number] DOB: 12/16/1950 Today's Date: 07/03/2021    History of Present Illness 70 y.o. male presents to [facility] hospital on 06/22/2021 with ongoing SOB. Pt found to have large L sided pleural effusion. Pt underwent thoracentesis on 8/16. 8/24 VATS. PMH includes anemia, A. fib, hypertension, cholecystitis status post cholecystectomy, CKD 3, CAD, Gill Bears syndrome, hyperlipidemia, left bundle branch block.    PT Comments    Pt mentioned having less pain during session and was motivated to move after chest tube taken out. Pt demonstrated increased ambulation distance with RW compared to previous session, with significantly increased HR and RR. Progress to ambulation without RW, as tolerated by pt, in upcoming sessions.   Pre-vitals - HR 79, BP 135-64(83) Post-vitals- BP 121/81(94), spO2 96%, HR 97  Follow Up Recommendations  Home health PT     Equipment Recommendations  None recommended by PT    Recommendations for Other Services       Precautions / Restrictions Precautions Precautions: Fall Precaution Comments: chest tube on L Restrictions Weight Bearing Restrictions: No    Mobility  Bed Mobility Overal bed mobility: Needs Assistance Bed Mobility: Supine to Sit     Supine to sit: Supervision;HOB elevated     General bed mobility comments: Supervision with [facility] for safety. Performed with increased time.    Transfers Overall transfer level: Needs assistance Equipment used: Rolling walker (2 wheeled) Transfers: Sit to/from Stand Sit to Stand: Min assist         General transfer comment: Sit to stand from bed with minA from SPT on L and bedrail on R to power up to standing. Performed with steady pace and increased speed.  Ambulation/Gait Ambulation/Gait assistance: Supervision Gait Distance (Feet): 400 Feet Assistive device: Rolling walker (2 wheeled) Gait Pattern/deviations: Step-through  pattern;Decreased stride length Gait velocity: Decreased Gait velocity interpretation: >2.62 ft/sec, indicative of community ambulatory General Gait Details: Supervision for safety   Stairs             Wheelchair Mobility    Modified Rankin (Stroke Patients Only)       Balance   Sitting-balance support: Feet supported;No upper extremity supported Sitting balance-Leahy Scale: Good     Standing balance support: Bilateral upper extremity supported;During functional activity Standing balance-Leahy Scale: Fair Standing balance comment: Required BUE for dynamic standing balance                            Cognition                                              Exercises      General Comments General comments (skin integrity, edema, etc.): Pt's HR increased from 79-137 during session with RR 39, spO2 93-97%      Pertinent Vitals/Pain Pain Score: 4  Pain Location: L side at chest tube insertion Pain Descriptors / Indicators: Sore;Discomfort Pain Intervention(s): Monitored during session;Repositioned    Home Living                      Prior Function            PT Goals (current goals can now be found in the care plan section) Progress towards PT goals: Progressing toward goals    Frequency  Min 3X/week      PT Plan Current plan remains appropriate    Co-evaluation              AM-PAC PT "6 Clicks" Mobility   Outcome Measure  Help needed turning from your back to your side while in a flat bed without using bedrails?: None Help needed moving from lying on your back to sitting on the side of a flat bed without using bedrails?: None Help needed moving to and from a bed to a chair (including a wheelchair)?: A Little Help needed standing up from a chair using your arms (e.g., wheelchair or bedside chair)?: A Little Help needed to walk in hospital room?: A Little Help needed climbing 3-5 steps with a railing?  : A Lot 6 Click Score: 19    End of Session   Activity Tolerance: Patient limited by fatigue Patient left: in chair;with call bell/phone within reach Nurse Communication: Mobility status PT Visit Diagnosis: Unsteadiness on feet (R26.81);Other abnormalities of gait and mobility (R26.89)     Time: 6378-5885 PT Time Calculation (min) (ACUTE ONLY): 24 min  Charges:  $Gait Training: 8-22 mins $Therapeutic Activity: 8-22 mins                    Velda Shell, SPT Acute Rehab: [phone]    Vance Gather 07/03/2021, 12:49 PM

## 2021-07-04 ENCOUNTER — Inpatient Hospital Stay (HOSPITAL_COMMUNITY): Payer: Medicare Other

## 2021-07-04 LAB — CBC
HCT: 28 % — ABNORMAL LOW (ref 39.0–52.0)
Hemoglobin: 8.4 g/dL — ABNORMAL LOW (ref 13.0–17.0)
MCH: 25.3 pg — ABNORMAL LOW (ref 26.0–34.0)
MCHC: 30 g/dL (ref 30.0–36.0)
MCV: 84.3 fL (ref 80.0–100.0)
Platelets: 366 10*3/uL (ref 150–400)
RBC: 3.32 MIL/uL — ABNORMAL LOW (ref 4.22–5.81)
RDW: 14.8 % (ref 11.5–15.5)
WBC: 6.6 10*3/uL (ref 4.0–10.5)
nRBC: 0 % (ref 0.0–0.2)

## 2021-07-04 LAB — BASIC METABOLIC PANEL
Anion gap: 5 (ref 5–15)
BUN: 9 mg/dL (ref 8–23)
CO2: 28 mmol/L (ref 22–32)
Calcium: 7.9 mg/dL — ABNORMAL LOW (ref 8.9–10.3)
Chloride: 98 mmol/L (ref 98–111)
Creatinine, Ser: 0.77 mg/dL (ref 0.61–1.24)
GFR, Estimated: >60 mL/min (ref >60)
Glucose, Bld: 87 mg/dL (ref 70–99)
Potassium: 3.6 mmol/L (ref 3.5–5.1)
Sodium: 131 mmol/L — ABNORMAL LOW (ref 135–145)

## 2021-07-04 MED ORDER — RIVAROXABAN 20 MG PO TABS
20.0000 mg | ORAL_TABLET | Freq: Every day | ORAL | Status: DC
  Administered 2021-07-05 – 2021-07-07 (×3): 20 mg via ORAL
  Filled 2021-07-04 (×3): qty 1

## 2021-07-04 NOTE — Plan of Care (Signed)
  Problem: Education: Goal: Knowledge of General Education information will improve Description: Including pain rating scale, medication(s)/side effects and non-pharmacologic comfort measures Outcome: Progressing   Problem: Health Behavior/Discharge Planning: Goal: Ability to manage health-related needs will improve Outcome: Progressing   Problem: Clinical Measurements: Goal: Ability to maintain clinical measurements within normal limits will improve Outcome: Progressing Goal: Will remain free from infection Outcome: Progressing Goal: Diagnostic test results will improve Outcome: Progressing Goal: Respiratory complications will improve Outcome: Progressing Goal: Cardiovascular complication will be avoided Outcome: Progressing   Problem: Activity: Goal: Risk for activity intolerance will decrease Outcome: Progressing   Problem: Nutrition: Goal: Adequate nutrition will be maintained Outcome: Progressing   Problem: Elimination: Goal: Will not experience complications related to bowel motility Outcome: Progressing Goal: Will not experience complications related to urinary retention Outcome: Progressing   Problem: Pain Managment: Goal: General experience of comfort will improve Outcome: Progressing   Problem: Safety: Goal: Ability to remain free from injury will improve Outcome: Progressing   Problem: Skin Integrity: Goal: Risk for impaired skin integrity will decrease Outcome: Progressing   Problem: Education: Goal: Knowledge of disease or condition will improve Outcome: Progressing Goal: Knowledge of the prescribed therapeutic regimen will improve Outcome: Progressing   Problem: Activity: Goal: Risk for activity intolerance will decrease Outcome: Progressing   Problem: Cardiac: Goal: Will achieve and/or maintain hemodynamic stability Outcome: Progressing   Problem: Clinical Measurements: Goal: Postoperative complications will be avoided or  minimized Outcome: Progressing   Problem: Respiratory: Goal: Respiratory status will improve Outcome: Progressing   Problem: Pain Management: Goal: Pain level will decrease Outcome: Progressing   Problem: Skin Integrity: Goal: Wound healing without signs and symptoms infection will improve Outcome: Progressing   

## 2021-07-04 NOTE — Progress Notes (Signed)
PROGRESS NOTE  [name] Rice RDE:[id number] DOB: Jan 23, 1951 DOA: 06/22/2021 PCP: [name], PA-C   LOS: 11 days   Brief Narrative / Interim history:  [name] Rice is a 71 y.o. male with medical history significant of anemia, A. fib, hypertension, cholecystitis and choledocholithiasis status post cholecystectomy, CKD 3, CAD s/p CABG, gilberts syndrome, hyperlipidemia, left bundle branch block who presents with ongoing shortness of breath.  He was found to have a large L sided effusion.  Admitted for management.  He had a left-sided effusion last year as well in September during his admission for laparoscopic cholecystectomy.  Pulmonary was consulted and he briefly had a chest tube which was removed 8/21.  Thoracic surgery consulted as well and patient went for robotic decortication on 8/24  Subjective / 24h Interval events:  Reports he is feeling better today, did well with PT yesterday, able to ambulate in the hallway.   Assessment & Plan:  Recurrent left-sided pleural effusion -patient here with a loculated, exudative left-sided pleural effusion.  Pulmonology consulted and had a chest tube placed 8/17 and removed 8/21, and a CT scan showed a trapped lung likely suggesting longstanding effusion.   - Thoracic surgery also consulted s/p robotic decortication 8/24, follow on postoperative cultures, so far no growth. -Chest tube management per CT surgery, posterior chest tube will be discontinued 8/26.  Repeat chest x-ray stable, no airleak, second chest tube will be discontinued today.  CT surgery note..  -Continue with IV Rocephin and Flagyl.  gram stain and cultures remain  negative.  Acute hypoxic respiratory failure-resolved, he remains on room air, he was encouraged to use incentive spirometer  History of A. Fib -patient had few episodes of intermittent bradycardia and metoprolol has been discontinued on 8/22.  Anticoagulation is on hold.  Can be resumed after chest tubes  are  discontinued per CT surgery, will resume Xarelto  tomorrow.  Essential hypertension-continue metoprolol  Hyponatremia -stable , asymptomatic  CAD, status post CABG 2021-no chest pain, stable  Hyperlipidemia-continue statin  Normocytic anemia-of chronic disease  DC Foley catheter 8/25 .   Scheduled Meds:  acetaminophen  1,000 mg Oral Q6H   Or   acetaminophen (TYLENOL) oral liquid 160 mg/5 mL  1,000 mg Oral Q6H   bisacodyl  10 mg Oral Daily   Chlorhexidine Gluconate Cloth  6 each Topical Daily   enoxaparin (LOVENOX) injection  40 mg Subcutaneous Daily   rosuvastatin  20 mg Oral Daily   senna-docusate  1 tablet Oral QHS   sodium chloride flush  3 mL Intravenous Q12H   traZODone  50 mg Oral QHS   Continuous Infusions:  cefTRIAXone (ROCEPHIN)  IV 200 mL/hr at 07/04/21 0100   metronidazole 500 mg (07/04/21 0645)   PRN Meds:.fentaNYL (SUBLIMAZE) injection, morphine injection, ondansetron (ZOFRAN) IV, oxyCODONE, polyethylene glycol, traMADol  Diet Orders (From admission, onward)     Start     Ordered   07/02/21 1807  Diet Heart Room service appropriate? Yes; Fluid consistency: Thin  Diet effective now       Question Answer Comment  Room service appropriate? Yes   Fluid consistency: Thin      07/02/21 1806             DVT prophylaxis: enoxaparin (LOVENOX) injection 40 mg Start: 07/03/21 1000 SCD's Start: 07/01/21 1726 Place and maintain sequential compression device Start: 06/23/21 1852     Code Status: Full Code  Family Communication: none at bedside  Status is: Inpatient  Remains inpatient  appropriate because:Inpatient level of care appropriate due to severity of illness  Dispo: The patient is from: Home              Anticipated d/c is to: Home              Patient currently is not medically stable to d/c.   Difficult to place patient No  Level of care: Progressive  Consultants:  PCCM CT surgery  Procedures:   XI ROBOTIC ASSISTED THORASCOPY  (Left) Dr. Dorris Fetch 8/24 -Drainage of Pleural Effusion -Decortication  Microbiology  none  Antimicrobials: none    Objective: Vitals:   07/04/21 0000 07/04/21 0003 07/04/21 0408 07/04/21 1040  BP: 117/70 117/70 125/73 121/70  Pulse: 87 87 86 78  Resp: 18 19 (!) 22 14  Temp:  97.6 F (36.4 C) 97.8 F (36.6 C) 98.5 F (36.9 C)  TempSrc:  Oral Oral Oral  SpO2:  95% 95% 97%  Weight:   89.6 kg   Height:        Intake/Output Summary (Last 24 hours) at 07/04/2021 1047 Last data filed at 07/04/2021 0700 Gross per 24 hour  Intake 940 ml  Output 450 ml  Net 490 ml   Filed Weights   07/02/21 0325 07/03/21 0602 07/04/21 0408  Weight: 92.2 kg 89.9 kg 89.6 kg    Examination:  Awake Alert, Oriented X 3, No new F.N deficits, Normal affect Symmetrical Chest wall movement, Good air movement bilaterally, CTAB RRR,No Gallops,Rubs or new Murmurs, No Parasternal Heave +ve B.Sounds, Abd Soft, No tenderness, No rebound - guarding or rigidity. No Cyanosis, Clubbing or edema, No new Rash or bruise      Data Reviewed: I have independently reviewed following labs and imaging studies   CBC: Recent Labs  Lab 07/01/21 0258 07/01/21 1400 07/02/21 0039 07/03/21 0136 07/04/21 0051  WBC 6.5 11.7* 9.4 8.8 6.6  HGB 9.4* 8.5* 9.1* 8.9* 8.4*  HCT 30.8* 28.3* 29.9* 29.0* 28.0*  MCV 83.7 85.0 84.2 83.1 84.3  PLT 379 332 321 340 366   Basic Metabolic Panel: Recent Labs  Lab 06/30/21 0134 07/01/21 0258 07/02/21 0039 07/03/21 0136 07/04/21 0051  NA 134* 133* 131* 131* 131*  K 4.4 4.3 4.2 3.7 3.6  CL 98 97* 97* 99 98  CO2 29 29 27 27 28   GLUCOSE 95 93 96 90 87  BUN 9 7* 8 8 9   CREATININE 0.94 1.01 0.96 0.84 0.77  CALCIUM 8.4* 8.3* 8.1* 7.9* 7.9*   Liver Function Tests: Recent Labs  Lab 06/30/21 0134 07/01/21 0258 07/03/21 0136  AST 13* 13* 12*  ALT 9 7 8   ALKPHOS 86 86 73  BILITOT 0.8 1.0 0.9  PROT 5.7* 5.8* 5.4*  ALBUMIN 2.0* 2.1* 2.0*   Coagulation Profile: No  results for input(s): INR, PROTIME in the last 168 hours.  HbA1C: No results for input(s): HGBA1C in the last 72 hours. CBG: No results for input(s): GLUCAP in the last 168 hours.  Recent Results (from the past 240 hour(s))  Aerobic/Anaerobic Culture w Gram Stain (surgical/deep wound)     Status: None (Preliminary result)   Collection Time: 07/01/21  8:46 AM   Specimen: PATH Cytology Pleural fluid; Body Fluid  Result Value Ref Range Status   Specimen Description PLEURAL FLUID  Final   Special Requests LEFT PLEURAL FLUID SPEC A  Final   Gram Stain   Final    MODERATE WBC PRESENT,BOTH PMN AND MONONUCLEAR NO ORGANISMS SEEN    Culture  Final    NO GROWTH 2 DAYS NO ANAEROBES ISOLATED; CULTURE IN PROGRESS FOR 5 DAYS Performed at [facility], [address], [place], [place] [address]    Report Status PENDING  Incomplete  Acid Fast Smear (AFB)     Status: None   Collection Time: 07/01/21  8:46 AM   Specimen: PATH Cytology Pleural fluid; Body Fluid  Result Value Ref Range Status   AFB Specimen Processing Concentration  Final   Acid Fast Smear Negative  Final    Comment: (NOTE) Performed At: [facility] [address] [place], [place] [id number] Jolene Schimke MD OE:[id number]    Source (AFB) LEFT PLEURAL FLUID SPEC A  Final    Comment: Performed at [facility], [address], [place], [place] [address]      Radiology Studies: DG Chest Port 1 View  Result Date: 07/04/2021 CLINICAL DATA:  Shortness of breath. EXAM: PORTABLE CHEST 1 VIEW COMPARISON:  July 03, 2021. FINDINGS: Stable cardiomediastinal silhouette. Sternotomy wires are noted. Right lung is clear. Stable position of left-sided chest tube. Stable small left pleural effusion is noted with associated basilar atelectasis. No definite pneumothorax is noted. Bony thorax is unremarkable. IMPRESSION: Stable left pleural effusion with associated atelectasis. Electronically Signed   By: Lupita Raider M.D.    On: 07/04/2021 09:29     Huey Bienenstock MD [facility]  Between 7 am - 7 pm I am available, please contact me via Amion (for emergencies) or Securechat (non urgent messages)  Between 7 pm - 7 am I am not available, please contact night coverage MD/APP via Amion

## 2021-07-04 NOTE — Progress Notes (Addendum)
[address]       [name] [address]             [phone]      3 Days Post-Op Procedure(s) (LRB): XI ROBOTIC ASSISTED THORASCOPY FOR DECORTICATION (Left) Subjective: Conts to feel better  Objective: Vital signs in last 24 hours: Temp:  [97.6 F (36.4 C)-97.8 F (36.6 C)] 97.8 F (36.6 C) (08/27 0408) Pulse Rate:  [85-90] 86 (08/27 0408) Cardiac Rhythm: Atrial fibrillation;Bundle branch block (08/27 0700) Resp:  [16-22] 22 (08/27 0408) BP: (103-133)/(63-73) 125/73 (08/27 0408) SpO2:  [94 %-96 %] 95 % (08/27 0408) Weight:  [89.6 kg] 89.6 kg (08/27 0408)  Hemodynamic parameters for last 24 hours:    Intake/Output from previous day: 08/26 0701 - 08/27 0700 In: 1060 [P.O.:360; IV Piggyback:700] Out: 450 [Urine:400; Chest Tube:50] Intake/Output this shift: No intake/output data recorded.  General appearance: alert, cooperative, and no distress Heart: irregularly irregular rhythm Lungs: dim left base Abdomen: benign Extremities: no edema Wound: incis healing well  Lab Results: Recent Labs    07/03/21 0136 07/04/21 0051  WBC 8.8 6.6  HGB 8.9* 8.4*  HCT 29.0* 28.0*  PLT 340 366   BMET:  Recent Labs    07/03/21 0136 07/04/21 0051  NA 131* 131*  K 3.7 3.6  CL 99 98  CO2 27 28  GLUCOSE 90 87  BUN 8 9  CREATININE 0.84 0.77  CALCIUM 7.9* 7.9*    PT/INR: No results for input(s): LABPROT, INR in the last 72 hours. ABG    Component Value Date/Time   PHART 7.327 (L) 07/29/2020 2140   HCO3 18.6 (L) 07/29/2020 2140   TCO2 20 (L) 07/29/2020 2140   ACIDBASEDEF 7.0 (H) 07/29/2020 2140   O2SAT 96.0 07/29/2020 2140   CBG (last 3)  No results for input(s): GLUCAP in the last 72 hours.  Meds Scheduled Meds:  acetaminophen  1,000 mg Oral Q6H   Or   acetaminophen (TYLENOL) oral liquid 160 mg/5 mL  1,000 mg Oral Q6H   bisacodyl  10 mg Oral Daily   Chlorhexidine Gluconate Cloth  6 each Topical Daily   enoxaparin (LOVENOX) injection  40  mg Subcutaneous Daily   rosuvastatin  20 mg Oral Daily   senna-docusate  1 tablet Oral QHS   sodium chloride flush  3 mL Intravenous Q12H   traZODone  50 mg Oral QHS   Continuous Infusions:  cefTRIAXone (ROCEPHIN)  IV 200 mL/hr at 07/04/21 0100   metronidazole 500 mg (07/04/21 0645)   PRN Meds:.fentaNYL (SUBLIMAZE) injection, morphine injection, ondansetron (ZOFRAN) IV, oxyCODONE, polyethylene glycol, traMADol  Xrays DG CHEST PORT 1 VIEW  Result Date: 07/03/2021 CLINICAL DATA:  Chest tube present.  Pneumothorax. EXAM: PORTABLE CHEST 1 VIEW COMPARISON:  07/02/2021 FINDINGS: Stable position of the left chest tube near the left lung apex. Stable volume loss in left hemithorax. Persistent densities at the left lung base. Postsurgical changes involving the heart. Right lung remains clear. Persistent subcutaneous gas in the left chest and left neck. No definite pneumothorax. IMPRESSION: 1. Stable position of the left chest tube.  No pneumothorax. 2. Stable volume loss in left hemithorax. Persistent densities at the left lung base. Electronically Signed   By: Richarda Overlie M.D.   On: 07/03/2021 08:58    Assessment/Plan: S/P Procedure(s) (LRB): XI ROBOTIC ASSISTED THORASCOPY FOR DECORTICATION (Left)    1 afebrile, VSS, afib 2 sats good on RA 3 CT 50 cc yesterday, no air leak,  serosang- d/c today 4 H/H pretty stable 5 no leukocytosis 6 stable mild hyponatremia 7 CXR no pntx, ASD/atx stable , maybe slightly improved 8 resume xarelto after tube removed 9 cont rehab/pulm toilet 10 medical management per primary  LOS: 11 days    Rowe Clack PA-C Pager [phone] 07/04/2021    Chart reviewed, patient examined, agree with above. CXR stable and minimal output from chest tube. It can be removed.

## 2021-07-05 ENCOUNTER — Inpatient Hospital Stay (HOSPITAL_COMMUNITY): Payer: Medicare Other

## 2021-07-05 LAB — CBC
HCT: 28.5 % — ABNORMAL LOW (ref 39.0–52.0)
Hemoglobin: 8.5 g/dL — ABNORMAL LOW (ref 13.0–17.0)
MCH: 25 pg — ABNORMAL LOW (ref 26.0–34.0)
MCHC: 29.8 g/dL — ABNORMAL LOW (ref 30.0–36.0)
MCV: 83.8 fL (ref 80.0–100.0)
Platelets: 400 10*3/uL (ref 150–400)
RBC: 3.4 MIL/uL — ABNORMAL LOW (ref 4.22–5.81)
RDW: 15 % (ref 11.5–15.5)
WBC: 6.9 10*3/uL (ref 4.0–10.5)
nRBC: 0 % (ref 0.0–0.2)

## 2021-07-05 LAB — BASIC METABOLIC PANEL
Anion gap: 8 (ref 5–15)
BUN: 6 mg/dL — ABNORMAL LOW (ref 8–23)
CO2: 27 mmol/L (ref 22–32)
Calcium: 8 mg/dL — ABNORMAL LOW (ref 8.9–10.3)
Chloride: 97 mmol/L — ABNORMAL LOW (ref 98–111)
Creatinine, Ser: 0.83 mg/dL (ref 0.61–1.24)
GFR, Estimated: >60 mL/min (ref >60)
Glucose, Bld: 90 mg/dL (ref 70–99)
Potassium: 3.5 mmol/L (ref 3.5–5.1)
Sodium: 132 mmol/L — ABNORMAL LOW (ref 135–145)

## 2021-07-05 LAB — FUNGUS STAIN

## 2021-07-05 LAB — FUNGAL STAIN REFLEX

## 2021-07-05 MED ORDER — GUAIFENESIN-DM 100-10 MG/5ML PO SYRP
5.0000 mL | ORAL_SOLUTION | ORAL | Status: DC | PRN
  Administered 2021-07-05 – 2021-07-06 (×4): 5 mL via ORAL
  Filled 2021-07-05 (×5): qty 5

## 2021-07-05 MED ORDER — AMOXICILLIN-POT CLAVULANATE 875-125 MG PO TABS
1.0000 | ORAL_TABLET | Freq: Two times a day (BID) | ORAL | Status: DC
  Administered 2021-07-05 – 2021-07-08 (×7): 1 via ORAL
  Filled 2021-07-05 (×7): qty 1

## 2021-07-05 MED ORDER — POTASSIUM CHLORIDE CRYS ER 20 MEQ PO TBCR
40.0000 meq | EXTENDED_RELEASE_TABLET | Freq: Once | ORAL | Status: AC
  Administered 2021-07-05: 40 meq via ORAL
  Filled 2021-07-05: qty 2

## 2021-07-05 NOTE — Plan of Care (Signed)
  Problem: Education: Goal: Knowledge of General Education information will improve Description: Including pain rating scale, medication(s)/side effects and non-pharmacologic comfort measures Outcome: Progressing   Problem: Health Behavior/Discharge Planning: Goal: Ability to manage health-related needs will improve Outcome: Progressing   Problem: Clinical Measurements: Goal: Ability to maintain clinical measurements within normal limits will improve Outcome: Progressing Goal: Will remain free from infection Outcome: Progressing Goal: Diagnostic test results will improve Outcome: Progressing Goal: Respiratory complications will improve Outcome: Progressing Goal: Cardiovascular complication will be avoided Outcome: Progressing   Problem: Activity: Goal: Risk for activity intolerance will decrease Outcome: Progressing   Problem: Nutrition: Goal: Adequate nutrition will be maintained Outcome: Progressing   Problem: Elimination: Goal: Will not experience complications related to bowel motility Outcome: Progressing Goal: Will not experience complications related to urinary retention Outcome: Progressing   Problem: Pain Managment: Goal: General experience of comfort will improve Outcome: Progressing   Problem: Safety: Goal: Ability to remain free from injury will improve Outcome: Progressing   Problem: Skin Integrity: Goal: Risk for impaired skin integrity will decrease Outcome: Progressing   Problem: Education: Goal: Knowledge of disease or condition will improve Outcome: Progressing Goal: Knowledge of the prescribed therapeutic regimen will improve Outcome: Progressing   Problem: Activity: Goal: Risk for activity intolerance will decrease Outcome: Progressing   Problem: Cardiac: Goal: Will achieve and/or maintain hemodynamic stability Outcome: Progressing   Problem: Clinical Measurements: Goal: Postoperative complications will be avoided or  minimized Outcome: Progressing   Problem: Respiratory: Goal: Respiratory status will improve Outcome: Progressing   Problem: Pain Management: Goal: Pain level will decrease Outcome: Progressing   Problem: Skin Integrity: Goal: Wound healing without signs and symptoms infection will improve Outcome: Progressing   

## 2021-07-05 NOTE — Progress Notes (Signed)
4 Days Post-Op Procedure(s) (LRB): XI ROBOTIC ASSISTED THORASCOPY FOR DECORTICATION (Left) Subjective: Coughing up mucous overnight. Feels ok this am.  Objective: Vital signs in last 24 hours: Temp:  [97.6 F (36.4 C)-98.8 F (37.1 C)] 98.8 F (37.1 C) (08/28 0900) Pulse Rate:  [78-88] 82 (08/28 0530) Cardiac Rhythm: Atrial fibrillation;Bundle branch block (08/28 0700) Resp:  [16-20] 17 (08/28 0530) BP: (115-134)/(64-75) 134/66 (08/28 0334) SpO2:  [92 %-98 %] 94 % (08/28 0530) Weight:  [88.4 kg] 88.4 kg (08/28 0334)  Hemodynamic parameters for last 24 hours:    Intake/Output from previous day: 08/27 0701 - 08/28 0700 In: 243 [P.O.:240; I.V.:3] Out: 100 [Urine:100] Intake/Output this shift: No intake/output data recorded.  General appearance: alert and cooperative Heart: irregularly irregular rhythm Lungs: diminished breath sounds left base Wound: chest tube sites ok  Lab Results: Recent Labs    07/04/21 0051 07/05/21 0048  WBC 6.6 6.9  HGB 8.4* 8.5*  HCT 28.0* 28.5*  PLT 366 400   BMET:  Recent Labs    07/04/21 0051 07/05/21 0048  NA 131* 132*  K 3.6 3.5  CL 98 97*  CO2 28 27  GLUCOSE 87 90  BUN 9 6*  CREATININE 0.77 0.83  CALCIUM 7.9* 8.0*    PT/INR: No results for input(s): LABPROT, INR in the last 72 hours. ABG    Component Value Date/Time   PHART 7.327 (L) 07/29/2020 2140   HCO3 18.6 (L) 07/29/2020 2140   TCO2 20 (L) 07/29/2020 2140   ACIDBASEDEF 7.0 (H) 07/29/2020 2140   O2SAT 96.0 07/29/2020 2140   CBG (last 3)  No results for input(s): GLUCAP in the last 72 hours.  CXR: stable  Assessment/Plan: S/P Procedure(s) (LRB): XI ROBOTIC ASSISTED THORASCOPY FOR DECORTICATION (Left)  He is doing well. Continue IS, ambulation Operative cultures negative so far. Would send home on a course of oral antibiotic for completion. Anticipate home tomorrow. He will need follow up with Dr. Dorris Fetch in a couple weeks with a CXR and can have chest  tube sutures removed at that time.   LOS: 12 days    Alleen Borne 07/05/2021

## 2021-07-05 NOTE — Progress Notes (Signed)
PROGRESS NOTE  [name] Rice QBH:[id number] DOB: August 09, 1951 DOA: 06/22/2021 PCP: [name], PA-C   LOS: 12 days   Brief Narrative / Interim history:  [name] Rice is a 70 y.o. male with medical history significant of anemia, A. fib, hypertension, cholecystitis and choledocholithiasis status post cholecystectomy, CKD 3, CAD s/p CABG, gilberts syndrome, hyperlipidemia, left bundle branch block who presents with ongoing shortness of breath.  He was found to have a large L sided effusion.  Admitted for management.  He had a left-sided effusion last year as well in September during his admission for laparoscopic cholecystectomy.  Pulmonary was consulted and he briefly had a chest tube which was removed 8/21.  Thoracic surgery consulted as well and patient went for robotic decortication on 8/24  Subjective / 24h Interval events:  Does report some pain at previous chest tube site, no dyspnea, no fever, no chills.    Assessment & Plan:  Recurrent left-sided pleural effusion -patient here with a loculated, exudative left-sided pleural effusion.  Pulmonology consulted and had a chest tube placed 8/17 and removed 8/21, and a CT scan showed a trapped lung likely suggesting longstanding effusion.   - Thoracic surgery also consulted s/p robotic decortication 8/24, follow on postoperative cultures, so far no growth. -Chest tube management per CT surgery, posterior chest tube will be discontinued 8/26.  Repeat chest x-ray stable, no airleak, second chest tube will be discontinued 8/27.   --But if culture remains negative, patient was treated on IV Flagyl and Rocephin, will switch to Augmentin, will discuss with CT surgery about length of treatment, need to follow with Dr. Dorris Fetch as an outpatient in couple weeks for chest x-ray and chest tube suture removal at that time.    Acute hypoxic respiratory failure-resolved, he remains on room air, he was encouraged to use incentive  spirometer  History of A. Fib -patient had few episodes of intermittent bradycardia and metoprolol has been discontinued on 8/22.  Anticoagulation is on hold.  Can be resumed after chest tubes  are discontinued per CT surgery, back on Xarelto.  Essential hypertension-continue metoprolol  Hyponatremia -stable , asymptomatic  CAD, status post CABG 2021-no chest pain, stable  Hyperlipidemia-continue statin  Normocytic anemia-of chronic disease  DC Foley catheter 8/25 .   Scheduled Meds:  acetaminophen  1,000 mg Oral Q6H   Or   acetaminophen (TYLENOL) oral liquid 160 mg/5 mL  1,000 mg Oral Q6H   bisacodyl  10 mg Oral Daily   Chlorhexidine Gluconate Cloth  6 each Topical Daily   rivaroxaban  20 mg Oral Q supper   rosuvastatin  20 mg Oral Daily   senna-docusate  1 tablet Oral QHS   sodium chloride flush  3 mL Intravenous Q12H   traZODone  50 mg Oral QHS   Continuous Infusions:  cefTRIAXone (ROCEPHIN)  IV 200 mL/hr at 07/05/21 0100   metronidazole 500 mg (07/05/21 0531)   PRN Meds:.fentaNYL (SUBLIMAZE) injection, guaiFENesin-dextromethorphan, morphine injection, ondansetron (ZOFRAN) IV, oxyCODONE, polyethylene glycol, traMADol  Diet Orders (From admission, onward)     Start     Ordered   07/02/21 1807  Diet Heart Room service appropriate? Yes; Fluid consistency: Thin  Diet effective now       Question Answer Comment  Room service appropriate? Yes   Fluid consistency: Thin      07/02/21 1806             DVT prophylaxis: SCD's Start: 07/01/21 1726 Place and maintain sequential compression device Start:  06/23/21 1852 rivaroxaban (XARELTO) tablet 20 mg     Code Status: Full Code  Family Communication: none at bedside  Status is: Inpatient  Remains inpatient appropriate because:Inpatient level of care appropriate due to severity of illness  Dispo: The patient is from: Home              Anticipated d/c is to: Home              Patient currently is not medically  stable to d/c.   Difficult to place patient No  Level of care: Progressive  Consultants:  PCCM CT surgery  Procedures:   XI ROBOTIC ASSISTED THORASCOPY (Left) Dr. Dorris Fetch 8/24 -Drainage of Pleural Effusion -Decortication  Microbiology  none  Antimicrobials: none    Objective: Vitals:   07/04/21 2343 07/05/21 0334 07/05/21 0530 07/05/21 0900  BP: 115/64 134/66    Pulse: 80 81 82   Resp: 20 19 17    Temp: 98.6 F (37 C) 97.9 F (36.6 C)  98.8 F (37.1 C)  TempSrc: Oral Oral  Oral  SpO2: 98% 96% 94%   Weight:  88.4 kg    Height:        Intake/Output Summary (Last 24 hours) at 07/05/2021 1101 Last data filed at 07/05/2021 0100 Gross per 24 hour  Intake 243 ml  Output 100 ml  Net 143 ml   Filed Weights   07/03/21 0602 07/04/21 0408 07/05/21 0334  Weight: 89.9 kg 89.6 kg 88.4 kg    Examination:  Awake Alert, Oriented X 3, No new F.N deficits, Normal affect Symmetrical Chest wall movement, clear to auscultation, but diminished at left lung base. RRR,No Gallops,Rubs or new Murmurs, No Parasternal Heave +ve B.Sounds, Abd Soft, No tenderness, No rebound - guarding or rigidity. No Cyanosis, Clubbing or edema, No new Rash or bruise      Data Reviewed: I have independently reviewed following labs and imaging studies   CBC: Recent Labs  Lab 07/01/21 1400 07/02/21 0039 07/03/21 0136 07/04/21 0051 07/05/21 0048  WBC 11.7* 9.4 8.8 6.6 6.9  HGB 8.5* 9.1* 8.9* 8.4* 8.5*  HCT 28.3* 29.9* 29.0* 28.0* 28.5*  MCV 85.0 84.2 83.1 84.3 83.8  PLT 332 321 340 366 400   Basic Metabolic Panel: Recent Labs  Lab 07/01/21 0258 07/02/21 0039 07/03/21 0136 07/04/21 0051 07/05/21 0048  NA 133* 131* 131* 131* 132*  K 4.3 4.2 3.7 3.6 3.5  CL 97* 97* 99 98 97*  CO2 29 27 27 28 27   GLUCOSE 93 96 90 87 90  BUN 7* 8 8 9  6*  CREATININE 1.01 0.96 0.84 0.77 0.83  CALCIUM 8.3* 8.1* 7.9* 7.9* 8.0*   Liver Function Tests: Recent Labs  Lab 06/30/21 0134 07/01/21 0258  07/03/21 0136  AST 13* 13* 12*  ALT 9 7 8   ALKPHOS 86 86 73  BILITOT 0.8 1.0 0.9  PROT 5.7* 5.8* 5.4*  ALBUMIN 2.0* 2.1* 2.0*   Coagulation Profile: No results for input(s): INR, PROTIME in the last 168 hours.  HbA1C: No results for input(s): HGBA1C in the last 72 hours. CBG: No results for input(s): GLUCAP in the last 168 hours.  Recent Results (from the past 240 hour(s))  Aerobic/Anaerobic Culture w Gram Stain (surgical/deep wound)     Status: None (Preliminary result)   Collection Time: 07/01/21  8:46 AM   Specimen: PATH Cytology Pleural fluid; Body Fluid  Result Value Ref Range Status   Specimen Description PLEURAL FLUID  Final   Special Requests LEFT  PLEURAL FLUID SPEC A  Final   Gram Stain   Final    MODERATE WBC PRESENT,BOTH PMN AND MONONUCLEAR NO ORGANISMS SEEN    Culture   Final    NO GROWTH 3 DAYS NO ANAEROBES ISOLATED; CULTURE IN PROGRESS FOR 5 DAYS Performed at [facility], [address] [place], [place] [address]    Report Status PENDING  Incomplete  Acid Fast Smear (AFB)     Status: None   Collection Time: 07/01/21  8:46 AM   Specimen: PATH Cytology Pleural fluid; Body Fluid  Result Value Ref Range Status   AFB Specimen Processing Concentration  Final   Acid Fast Smear Negative  Final    Comment: (NOTE) Performed At: [facility] [address] [place], [place] [id number] Jolene Schimke MD FV:[id number]    Source (AFB) LEFT PLEURAL FLUID SPEC A  Final    Comment: Performed at [facility], [address], [place], [place] [address]      Radiology Studies: DG Chest 2 View  Result Date: 07/05/2021 CLINICAL DATA:  Follow-up.  Shortness of breath. EXAM: CHEST - 2 VIEW COMPARISON:  07/04/2021 FINDINGS: Removal of left-sided chest tube. Small, loculated hydropneumothorax appears similar to the previous exam. Previous median sternotomy. Left atrial appendage clip. Status post CABG. Atelectasis is identified within the left lower lobe.  Right lung clear. IMPRESSION: Stable small, loculated left hydropneumothorax status post chest tube removal. Unchanged. Similar appearance of left base atelectasis. Electronically Signed   By: Signa Kell M.D.   On: 07/05/2021 08:27     Huey Bienenstock MD [facility]  Between 7 am - 7 pm I am available, please contact me via Amion (for emergencies) or Securechat (non urgent messages)  Between 7 pm - 7 am I am not available, please contact night coverage MD/APP via Amion

## 2021-07-06 LAB — AEROBIC/ANAEROBIC CULTURE W GRAM STAIN (SURGICAL/DEEP WOUND): Culture: NO GROWTH

## 2021-07-06 LAB — SURGICAL PATHOLOGY

## 2021-07-06 MED ORDER — TRAZODONE HCL 50 MG PO TABS
100.0000 mg | ORAL_TABLET | Freq: Every day | ORAL | Status: DC
  Administered 2021-07-06 – 2021-07-07 (×2): 100 mg via ORAL
  Filled 2021-07-06 (×2): qty 2

## 2021-07-06 MED ORDER — COVID-19 MRNA VAC-TRIS(PFIZER) 30 MCG/0.3ML IM SUSP
0.3000 mL | Freq: Once | INTRAMUSCULAR | Status: AC
  Administered 2021-07-06: 0.3 mL via INTRAMUSCULAR
  Filled 2021-07-06: qty 0.3

## 2021-07-06 NOTE — Progress Notes (Signed)
Prefers to take ultram for severe pain than oxy. since the latter  makes him sick.

## 2021-07-06 NOTE — Progress Notes (Signed)
Physical Therapy Treatment Patient Details Name: [name] Rice MRN: [id number] DOB: December 25, 1950 Today's Date: 07/06/2021    History of Present Illness 70 y.o. male presents to [facility] hospital on 06/22/2021 with ongoing SOB. Pt found to have large L sided pleural effusion. Pt underwent thoracentesis on 8/16. 8/24 VATS. PMH includes anemia, A. fib, hypertension, cholecystitis status post cholecystectomy, CKD 3, CAD, Gill Bears syndrome, hyperlipidemia, left bundle branch block.    PT Comments    Pt very anxious today as he reports he hasn't slept in 2 nights and is fearful of falling when he goes home. Worked on ambulation without AD as pt states his house is too small to use his rollator that he just walks short distances chair to chair. Pt unsafe to amb without AD at this time as he had episodes of loss of balance requiring minA to prevent fall. Pt unable to get groceries or do IADLs at this time. Pt to benefit from ST-SNF to achieve safe mod I level of function to return home alone as pt has no family or friends to assist him. Acute PT to cont to follow.    Follow Up Recommendations  SNF;Supervision/Assistance - 24 hour     Equipment Recommendations  None recommended by PT    Recommendations for Other Services       Precautions / Restrictions Precautions Precautions: Fall Restrictions Weight Bearing Restrictions: No    Mobility  Bed Mobility Overal bed mobility: Needs Assistance Bed Mobility: Supine to Sit     Supine to sit: Supervision;HOB elevated     General bed mobility comments: pt with definite use of bedrail and demo'd labored effort, pt would've required physical assist if pt didn't use bed rail    Transfers Overall transfer level: Needs assistance Equipment used: Rolling walker (2 wheeled) Transfers: Sit to/from Stand Sit to Stand: Min guard         General transfer comment: min gaurd to steady pt during transition of hands from bed to  RW  Ambulation/Gait Ambulation/Gait assistance: Min guard;Min assist Gait Distance (Feet): 120 Feet (x2) Assistive device: Rolling walker (2 wheeled);1 person hand held assist Gait Pattern/deviations: Step-through pattern;Decreased stride length;Trunk flexed;Narrow base of support Gait velocity: Decreased Gait velocity interpretation: <1.31 ft/sec, indicative of household ambulator General Gait Details: pt first amb with RW, pt with noted SOB, required standing rest break at 60', SPO2 at 98% on RA, HR up to 107bpm, pt then trialed amb without AD as pt reports "It's to narrow in my house to use a RW." pt provided L HHA, pt reaching with R UE for rail, pt with quicker onset of fatigue, HR up to 121 bpm, pt with instability and LOB requiring minA to maintain balance during turning   Stairs             Wheelchair Mobility    Modified Rankin (Stroke Patients Only)       Balance Overall balance assessment: Needs assistance Sitting-balance support: Feet supported;No upper extremity supported Sitting balance-Leahy Scale: Good     Standing balance support: Bilateral upper extremity supported;During functional activity Standing balance-Leahy Scale: Fair Standing balance comment: Required BUE for dynamic standing balance                            Cognition Arousal/Alertness: Awake/alert Behavior During Therapy: Anxious Overall Cognitive Status: Within Functional Limits for tasks assessed  General Comments: pt very anxious regarding going home because "I just can't breath or walk well. I'm so weak" Pt also reports "I haven't slept in 2 days"      Exercises      General Comments General comments (skin integrity, edema, etc.): Pt with increased HR to 120s when amb without AD, SPO2 >97% on RA      Pertinent Vitals/Pain Pain Assessment: No/denies pain    Home Living                      Prior Function             PT Goals (current goals can now be found in the care plan section) Progress towards PT goals: Progressing toward goals    Frequency    Min 3X/week      PT Plan Discharge plan needs to be updated    Co-evaluation              AM-PAC PT "6 Clicks" Mobility   Outcome Measure  Help needed turning from your back to your side while in a flat bed without using bedrails?: None Help needed moving from lying on your back to sitting on the side of a flat bed without using bedrails?: None Help needed moving to and from a bed to a chair (including a wheelchair)?: A Little Help needed standing up from a chair using your arms (e.g., wheelchair or bedside chair)?: A Little Help needed to walk in hospital room?: A Little Help needed climbing 3-5 steps with a railing? : A Lot 6 Click Score: 19    End of Session Equipment Utilized During Treatment: Gait belt Activity Tolerance: Patient limited by fatigue Patient left: in chair;with call bell/phone within reach Nurse Communication: Mobility status PT Visit Diagnosis: Unsteadiness on feet (R26.81);Other abnormalities of gait and mobility (R26.89)     Time: 8546-2703 PT Time Calculation (min) (ACUTE ONLY): 26 min  Charges:  $Gait Training: 23-37 mins                     Lewis Shock, PT, DPT Acute Rehabilitation Services Pager #: [phone] Office #: [phone]    Iona Hansen 07/06/2021, 10:31 AM

## 2021-07-06 NOTE — Discharge Instructions (Signed)
Robot-Assisted Thoracic Surgery, Care After °The following information offers guidance on how to care for yourself after your procedure. Your health care provider may also give you more specific instructions. If you have problems or questions, contact your health care provider. °What can I expect after the procedure? °After the procedure, it is common to have: °Some pain and aches in the area of your surgical incisions. °Pain when breathing in (inhaling) and coughing. °Tiredness (fatigue). °Trouble sleeping. °Constipation. °Follow these instructions at home: °Medicines °Take over-the-counter and prescription medicines only as told by your health care provider. °If you were prescribed an antibiotic medicine, take it as told by your health care provider. Do not stop taking the antibiotic even if you start to feel better. °Talk with your health care provider about safe and effective ways to manage pain after your procedure. Pain management should fit your specific health needs. °Take pain medicine before pain becomes severe. Relieving and controlling your pain will make breathing easier for you. °Ask your health care provider if the medicine prescribed to you requires you to avoid driving or using machinery. °Eating and drinking °Follow instructions from your health care provider about eating or drinking restrictions. These will vary depending on what procedure you had. Your health care provider may recommend: °A liquid diet or soft diet for the first few days. °Meals that are smaller and more frequent. °A diet of fruits, vegetables, whole grains, and low-fat proteins. °Limiting foods that are high in fat and processed sugar, including fried or sweet foods. °Incision care °Follow instructions from your health care provider about how to take care of your incisions. Make sure you: °Wash your hands with soap and water for at least 20 seconds before and after you change your bandage (dressing). If soap and water are not  available, use hand sanitizer. °Change your dressing as told by your health care provider. °Leave stitches (sutures), skin glue, or adhesive strips in place. These skin closures may need to stay in place for 2 weeks or longer. If adhesive strip edges start to loosen and curl up, you may trim the loose edges. Do not remove adhesive strips completely unless your health care provider tells you to do that. °Check your incision area every day for signs of infection. Check for: °Redness, swelling, or more pain. °Fluid or blood. °Warmth. °Pus or a bad smell. °Activity °Return to your normal activities as told by your health care provider. Ask your health care provider what activities are safe for you. °Ask your health care provider when it is safe for you to drive. °Do not lift anything that is heavier than 10 lb (4.5 kg), or the limit that you are told, until your health care provider says that it is safe. °Rest as told by your health care provider. °Avoid sitting for a long time without moving. Get up to take short walks every 1-2 hours. This is important to improve blood flow and breathing. Ask for help if you feel weak or unsteady. °Do exercises as told by your health care provider. °Pneumonia prevention °Do deep breathing exercises and cough regularly as directed. This helps clear mucus and opens your lungs. Doing this helps prevent lung infection (pneumonia). °If you were given an incentive spirometer, use it as told. An incentive spirometer is a tool that measures how well you are filling your lungs with each breath. °Coughing may hurt less if you try to support your chest. This is called splinting. Try one of these when you cough: °  Hold a pillow against your chest. °Place the palms of both hands on top of your incision area. °Do not use any products that contain nicotine or tobacco. These products include cigarettes, chewing tobacco, and vaping devices, such as e-cigarettes. If you need help quitting, ask your  health care provider. °Avoid secondhand smoke. °General instructions °If you have a drainage tube: °Follow instructions from your health care provider about how to take care of it. °Do not travel by airplane after your tube is removed until your health care provider tells you it is safe. °You may need to take these actions to prevent or treat constipation: °Drink enough fluid to keep your urine pale yellow. °Take over-the-counter or prescription medicines. °Eat foods that are high in fiber, such as beans, whole grains, and fresh fruits and vegetables. °Limit foods that are high in fat and processed sugars, such as fried or sweet foods. °Keep all follow-up visits. This is important. °Contact a health care provider if: °You have redness, swelling, or more pain around an incision. °You have fluid or blood coming from an incision. °An incision feels warm to the touch. °You have pus or a bad smell coming from an incision. °You have a fever. °You cannot eat or drink without vomiting. °Your pain medicine is not controlling your pain. °Get help right away if: °You have chest pain. °Your heart is beating quickly. °You have trouble breathing. °You have trouble speaking. °You are confused. °You feel weak or dizzy, or you faint. °These symptoms may represent a serious problem that is an emergency. Do not wait to see if the symptoms will go away. Get medical help right away. Call your local emergency services (911 in the [place] Do not drive yourself to the hospital. °Summary °Talk with your health care provider about safe and effective ways to manage pain after your procedure. Pain management should fit your specific health needs. °Return to your normal activities as told by your health care provider. Ask your health care provider what activities are safe for you. °Do deep breathing exercises and cough regularly as directed. This helps to clear mucus and prevent pneumonia. If it hurts to cough, ease pain by holding a pillow  against your chest or by placing the palms of both hands over your incisions. °This information is not intended to replace advice given to you by your health care provider. Make sure you discuss any questions you have with your health care provider. °Document Revised: 07/18/2020 Document Reviewed: 07/18/2020 °Elsevier Patient Education © 2022 [employer] Inc[employer] °  °

## 2021-07-06 NOTE — Progress Notes (Addendum)
      [address]       [name] Rice [address]             [phone]       5 Days Post-Op Procedure(s) (LRB): XI ROBOTIC ASSISTED THORASCOPY FOR DECORTICATION (Left)  Subjective: Patient with a lot of sputum at night but able to expectorate  Objective: Vital signs in last 24 hours: Temp:  [97.5 F (36.4 C)-98.8 F (37.1 C)] 97.5 F (36.4 C) (08/29 0347) Pulse Rate:  [71-87] 82 (08/29 0347) Cardiac Rhythm: Atrial fibrillation (08/28 2000) Resp:  [15-26] 23 (08/29 0347) BP: (121-141)/(53-77) 141/77 (08/29 0347) SpO2:  [95 %-99 %] 97 % (08/29 0347) Weight:  [88.1 kg] 88.1 kg (08/29 0347)     Intake/Output from previous day: 08/28 0701 - 08/29 0700 In: 970.2 [P.O.:120; I.V.:3; IV Piggyback:847.2] Out: 750 [Urine:750]   Physical Exam:  Cardiovascular:IRRR IRRR Pulmonary: Clear to auscultation on the right;diminished left base Abdomen: Soft, non tender, bowel sounds present. Extremities:No lower extremity edema. Wounds: Clean and dry.  No erythema or signs of infection.   Lab Results: CBC: Recent Labs    07/04/21 0051 07/05/21 0048  WBC 6.6 6.9  HGB 8.4* 8.5*  HCT 28.0* 28.5*  PLT 366 400   BMET:  Recent Labs    07/04/21 0051 07/05/21 0048  NA 131* 132*  K 3.6 3.5  CL 98 97*  CO2 28 27  GLUCOSE 87 90  BUN 9 6*  CREATININE 0.77 0.83  CALCIUM 7.9* 8.0*    PT/INR: No results for input(s): LABPROT, INR in the last 72 hours. ABG:  INR: Will add last result for INR, ABG once components are confirmed Will add last 4 CBG results once components are confirmed  Assessment/Plan:  1. CV - History of a fib;a fib with CVR this am. On Rivaroxaban 20 mg every evening. He was also on Lopressor 12.5 mg daily prior to admission 2.  Pulmonary - On room air. No CXR ordered for today. Encourage incentive spirometer. 3. Anemia-Last H and H stable at 8.5 and 28.5 4. ID-on Augmentin. Culture shows no growth 4 days. 5. Ok for discharge from CVTS once  arrangements made  [name] Rice [facility] 07/06/2021,7:08 AM [phone]   Patient seen and examined, agree with above From a surgical standpoint he is ready for DC, but lives alone with no family in town Dc planning  [name] Spare C. Dorris Fetch, MD [facility] [phone]

## 2021-07-06 NOTE — Progress Notes (Addendum)
PROGRESS NOTE  [name] Rice QAS:[id number] DOB: [birth year] DOA: 06/22/2021 PCP: [name] Rice., PA-C   LOS: 13 days   Brief Narrative / Interim history:  [name] Rice is a 70 y.o. male with medical history significant of anemia, A. fib, hypertension, cholecystitis and choledocholithiasis status post cholecystectomy, CKD 3, CAD s/p CABG, gilberts syndrome, hyperlipidemia, left bundle branch block who presents with ongoing shortness of breath.  He was found to have a large L sided effusion.  Admitted for management.  He had a left-sided effusion last year as well in September during his admission for laparoscopic cholecystectomy.  Pulmonary was consulted and he briefly had a chest tube which was removed 8/21.  Thoracic surgery consulted as well and patient went for robotic decortication on 8/24  Subjective / 24h Interval events:  Reports poor night sleep, and cough at bedtime, otherwise no dyspnea or fever.  Assessment & Plan:  Recurrent left-sided pleural effusion -patient here with a loculated, exudative left-sided pleural effusion.  Pulmonology consulted and had a chest tube placed 8/17 and removed 8/21, and a CT scan showed a trapped lung likely suggesting longstanding effusion.   - Thoracic surgery also consulted s/p robotic decortication 8/24, follow on postoperative cultures, so far no growth. -Chest tube management per CT surgery, posterior chest tube will be discontinued 8/26.  Repeat chest x-ray stable, no airleak, second chest tube discontinued 8/27.   -- Intraoperative culture remains negative, patient was treated on IV Flagyl and Rocephin, did switch to Augmentin 8/28 , will discuss with CT surgery about length of recommended treatment, need to follow with Dr. Dorris Fetch as an outpatient in couple weeks for chest x-ray and chest tube suture removal at that time.    Acute hypoxic respiratory failure-resolved, he remains on room air, he was encouraged to use incentive  spirometer  History of A. Fib -patient had few episodes of intermittent bradycardia and metoprolol has been discontinued on 8/22. Initially  Anticoagulation is on hold.  Resumed Xarelto after chest tubes were discontinued.  Essential hypertension-continue metoprolol  Hyponatremia -stable , asymptomatic  CAD, status post CABG 2021-no chest pain, stable  Hyperlipidemia-continue statin  Normocytic anemia-of chronic disease  DC Foley catheter 8/25, Patient reports poor night sleep, so we will increase his trazodone from 50 to 100 mg oral at bedtime.   Scheduled Meds:  acetaminophen  1,000 mg Oral Q6H   Or   acetaminophen (TYLENOL) oral liquid 160 mg/5 mL  1,000 mg Oral Q6H   amoxicillin-clavulanate  1 tablet Oral Q12H   bisacodyl  10 mg Oral Daily   Chlorhexidine Gluconate Cloth  6 each Topical Daily   COVID-19 mRNA Vac-TriS ([employer])  0.3 mL Intramuscular Once   rivaroxaban  20 mg Oral Q supper   rosuvastatin  20 mg Oral Daily   senna-docusate  1 tablet Oral QHS   sodium chloride flush  3 mL Intravenous Q12H   traZODone  50 mg Oral QHS   Continuous Infusions:   PRN Meds:.fentaNYL (SUBLIMAZE) injection, guaiFENesin-dextromethorphan, morphine injection, ondansetron (ZOFRAN) IV, oxyCODONE, polyethylene glycol, traMADol  Diet Orders (From admission, onward)     Start     Ordered   07/02/21 1807  Diet Heart Room service appropriate? Yes; Fluid consistency: Thin  Diet effective now       Question Answer Comment  Room service appropriate? Yes   Fluid consistency: Thin      07/02/21 1806             DVT prophylaxis: SCD's  Start: 07/01/21 1726 Place and maintain sequential compression device Start: 06/23/21 1852 rivaroxaban (XARELTO) tablet 20 mg     Code Status: Full Code  Family Communication: none at bedside  Status is: Inpatient  Remains inpatient appropriate because:Inpatient level of care appropriate due to severity of illness  Dispo: The patient is from:  Home              Anticipated d/c is to: SNF              Patient currently is medically stable to d/c. Once SNF bed is available   Difficult to place patient No  Level of care: Med-Surg  Consultants:  PCCM CT surgery  Procedures:   XI ROBOTIC ASSISTED THORASCOPY (Left) Dr. Dorris Fetch 8/24 -Drainage of Pleural Effusion -Decortication  Microbiology  none  Antimicrobials: none    Objective: Vitals:   07/05/21 2336 07/06/21 0347 07/06/21 0840 07/06/21 1122  BP: 124/60 (!) 141/77 134/68   Pulse: 85 82 85 84  Resp: 15 (!) 23 17 17   Temp: 98.8 F (37.1 C) (!) 97.5 F (36.4 C) 97.8 F (36.6 C) 97.8 F (36.6 C)  TempSrc: Oral Oral Oral Oral  SpO2: 99% 97% 99% 95%  Weight:  88.1 kg    Height:        Intake/Output Summary (Last 24 hours) at 07/06/2021 1205 Last data filed at 07/06/2021 0349 Gross per 24 hour  Intake 3 ml  Output 550 ml  Net -547 ml   Filed Weights   07/04/21 0408 07/05/21 0334 07/06/21 0347  Weight: 89.6 kg 88.4 kg 88.1 kg    Examination:  Awake Alert, Oriented X 3, frail, no apparent distress Symmetrical Chest wall movement, diminished air entry in left lung base. RRR,No Gallops,Rubs or new Murmurs, No Parasternal Heave +ve B.Sounds, Abd Soft, No tenderness, No rebound - guarding or rigidity. No Cyanosis, Clubbing or edema, No new Rash or bruise       Data Reviewed: I have independently reviewed following labs and imaging studies   CBC: Recent Labs  Lab 07/01/21 1400 07/02/21 0039 07/03/21 0136 07/04/21 0051 07/05/21 0048  WBC 11.7* 9.4 8.8 6.6 6.9  HGB 8.5* 9.1* 8.9* 8.4* 8.5*  HCT 28.3* 29.9* 29.0* 28.0* 28.5*  MCV 85.0 84.2 83.1 84.3 83.8  PLT 332 321 340 366 400   Basic Metabolic Panel: Recent Labs  Lab 07/01/21 0258 07/02/21 0039 07/03/21 0136 07/04/21 0051 07/05/21 0048  NA 133* 131* 131* 131* 132*  K 4.3 4.2 3.7 3.6 3.5  CL 97* 97* 99 98 97*  CO2 29 27 27 28 27   GLUCOSE 93 96 90 87 90  BUN 7* 8 8 9  6*   CREATININE 1.01 0.96 0.84 0.77 0.83  CALCIUM 8.3* 8.1* 7.9* 7.9* 8.0*   Liver Function Tests: Recent Labs  Lab 06/30/21 0134 07/01/21 0258 07/03/21 0136  AST 13* 13* 12*  ALT 9 7 8   ALKPHOS 86 86 73  BILITOT 0.8 1.0 0.9  PROT 5.7* 5.8* 5.4*  ALBUMIN 2.0* 2.1* 2.0*   Coagulation Profile: No results for input(s): INR, PROTIME in the last 168 hours.  HbA1C: No results for input(s): HGBA1C in the last 72 hours. CBG: No results for input(s): GLUCAP in the last 168 hours.  Recent Results (from the past 240 hour(s))  Aerobic/Anaerobic Culture w Gram Stain (surgical/deep wound)     Status: None (Preliminary result)   Collection Time: 07/01/21  8:46 AM   Specimen: PATH Cytology Pleural fluid; Body Fluid  Result  Value Ref Range Status   Specimen Description PLEURAL FLUID  Final   Special Requests LEFT PLEURAL FLUID SPEC A  Final   Gram Stain   Final    MODERATE WBC PRESENT,BOTH PMN AND MONONUCLEAR NO ORGANISMS SEEN    Culture   Final    NO GROWTH 4 DAYS NO ANAEROBES ISOLATED; CULTURE IN PROGRESS FOR 5 DAYS Performed at [facility], [address], [place], [place] [address]    Report Status PENDING  Incomplete  Fungus Stain     Status: None   Collection Time: 07/01/21  8:46 AM   Specimen: PATH Cytology Pleural fluid; Body Fluid  Result Value Ref Range Status   FUNGUS STAIN Final report  Final    Comment: (NOTE) Performed At: [facility] [address] [place], [place] [id number] Jolene Schimke MD ZJ:[id number]    Fungal Source PLEURAL  Final    Comment: FLUID Performed at [facility], [address], [place], [place] [address]   Acid Fast Smear (AFB)     Status: None   Collection Time: 07/01/21  8:46 AM   Specimen: PATH Cytology Pleural fluid; Body Fluid  Result Value Ref Range Status   AFB Specimen Processing Concentration  Final   Acid Fast Smear Negative  Final    Comment: (NOTE) Performed At: [facility] [address]  [place], [place] [id number] Jolene Schimke MD [id number]    Source (AFB) LEFT PLEURAL FLUID SPEC A  Final    Comment: Performed at [facility], [address], [place], [place] [address]  Fungal Stain reflex     Status: None   Collection Time: 07/01/21  8:46 AM  Result Value Ref Range Status   Fungal stain result 1 Comment  Final    Comment: (NOTE) KOH/Calcofluor preparation:  no fungus observed. Performed At: [facility] [address] [place], [place] [id number] Jolene Schimke MD [id number]:[id number]       Radiology Studies: No results found.   Huey Bienenstock MD [facility]  Between 7 am - 7 pm I am available, please contact me via Amion (for emergencies) or Securechat (non urgent messages)  Between 7 pm - 7 am I am not available, please contact night coverage MD/APP via Amion

## 2021-07-07 ENCOUNTER — Inpatient Hospital Stay (HOSPITAL_COMMUNITY): Payer: Medicare Other

## 2021-07-07 MED ORDER — TRAMADOL HCL 50 MG PO TABS: 50.0000 mg | ORAL_TABLET | Freq: Four times a day (QID) | ORAL | 0 refills | Status: DC | PRN

## 2021-07-07 MED ORDER — FENTANYL CITRATE PF 50 MCG/ML IJ SOSY: 25.0000 ug | PREFILLED_SYRINGE | INTRAMUSCULAR | Status: DC | PRN

## 2021-07-07 MED ORDER — POLYETHYLENE GLYCOL 3350 17 G PO PACK: 17.0000 g | PACK | Freq: Every day | ORAL | 0 refills | Status: DC | PRN

## 2021-07-07 MED ORDER — AMOXICILLIN-POT CLAVULANATE 875-125 MG PO TABS: 1.0000 | ORAL_TABLET | Freq: Two times a day (BID) | ORAL | 0 refills | Status: AC

## 2021-07-07 MED ORDER — TRAZODONE HCL 100 MG PO TABS: 100.0000 mg | ORAL_TABLET | Freq: Every evening | ORAL | Status: DC | PRN

## 2021-07-07 MED ORDER — MELATONIN 3 MG PO TABS
3.0000 mg | ORAL_TABLET | Freq: Every evening | ORAL | Status: DC | PRN
  Administered 2021-07-07: 3 mg via ORAL
  Filled 2021-07-07: qty 1

## 2021-07-07 NOTE — Plan of Care (Signed)
  Problem: Education: Goal: Knowledge of General Education information will improve Description: Including pain rating scale, medication(s)/side effects and non-pharmacologic comfort measures Outcome: Progressing   Problem: Health Behavior/Discharge Planning: Goal: Ability to manage health-related needs will improve Outcome: Progressing   Problem: Clinical Measurements: Goal: Ability to maintain clinical measurements within normal limits will improve Outcome: Progressing Goal: Will remain free from infection Outcome: Progressing Goal: Diagnostic test results will improve Outcome: Progressing Goal: Respiratory complications will improve Outcome: Progressing Goal: Cardiovascular complication will be avoided Outcome: Progressing   Problem: Activity: Goal: Risk for activity intolerance will decrease Outcome: Progressing   Problem: Nutrition: Goal: Adequate nutrition will be maintained Outcome: Progressing   Problem: Elimination: Goal: Will not experience complications related to bowel motility Outcome: Progressing Goal: Will not experience complications related to urinary retention Outcome: Progressing   Problem: Pain Managment: Goal: General experience of comfort will improve Outcome: Progressing   Problem: Safety: Goal: Ability to remain free from injury will improve Outcome: Progressing   Problem: Skin Integrity: Goal: Risk for impaired skin integrity will decrease Outcome: Progressing   Problem: Education: Goal: Knowledge of disease or condition will improve Outcome: Progressing Goal: Knowledge of the prescribed therapeutic regimen will improve Outcome: Progressing   Problem: Activity: Goal: Risk for activity intolerance will decrease Outcome: Progressing   Problem: Cardiac: Goal: Will achieve and/or maintain hemodynamic stability Outcome: Progressing   Problem: Clinical Measurements: Goal: Postoperative complications will be avoided or  minimized Outcome: Progressing   Problem: Respiratory: Goal: Respiratory status will improve Outcome: Progressing   Problem: Pain Management: Goal: Pain level will decrease Outcome: Progressing   Problem: Skin Integrity: Goal: Wound healing without signs and symptoms infection will improve Outcome: Progressing   

## 2021-07-07 NOTE — Anesthesia Postprocedure Evaluation (Signed)
Anesthesia Post Note  Patient: [name] Rice  Procedure(s) Performed: XI ROBOTIC ASSISTED THORASCOPY FOR DECORTICATION (Left: Chest)     Patient location during evaluation: PACU Anesthesia Type: General Level of consciousness: awake and alert Pain management: pain level controlled Vital Signs Assessment: post-procedure vital signs reviewed and stable Respiratory status: spontaneous breathing, nonlabored ventilation, respiratory function stable and patient connected to nasal cannula oxygen Cardiovascular status: blood pressure returned to baseline and stable Postop Assessment: no apparent nausea or vomiting Anesthetic complications: no   No notable events documented.  Last Vitals:  Vitals:   07/07/21 0756 07/07/21 1933  BP:  129/78  Pulse:  97  Resp:  18  Temp:  37.1 C  SpO2: 96% 97%    Last Pain:  Vitals:   07/07/21 1933  TempSrc: Oral  PainSc:                  [name]

## 2021-07-07 NOTE — Progress Notes (Signed)
As per MSW, who spoke to the brother who is coming to pick up pt. claimed that the latter cannot come today  and be here tom. at noon to pick him up.MD aware discharge cancelled for tom. Pt. Made aware.

## 2021-07-07 NOTE — Discharge Summary (Signed)
Physician Discharge Summary  [name] Rice LPF:[id number] DOB: Mar 12, 1951 DOA: 06/22/2021  PCP: [name], PA-C  Admit date: 06/22/2021 Discharge date: 07/07/2021  Admitted From: Home Disposition:  SNF  Recommendations for Outpatient Follow-up:  Please obtain BMP/CBC in one week Patient to follow with cardiothoracic surgery, please see appointment scheduled below.   Discharge Condition:Stable CODE STATUS:FULL Diet recommendation: Heart Healthy  Brief/Interim Summary:  [name] Rice is a 70 y.o. male with medical history significant of anemia, A. fib, hypertension, cholecystitis and choledocholithiasis status post cholecystectomy, CKD 3, CAD s/p CABG, gilberts syndrome, hyperlipidemia, left bundle branch block who presents with ongoing shortness of breath.  He was found to have a large L sided effusion.  Admitted for management.  He had a left-sided effusion last year as well in September during his admission for laparoscopic cholecystectomy.  Pulmonary was consulted and he briefly had a chest tube which was removed 8/21.  Thoracic surgery consulted as well and patient went for robotic decortication on 8/24, patient had postoperative chest tube x2, they have been discontinued,  intraoperative cultures are negative, followed by CT surgery, he is stable for discharge.  Recurrent left-sided pleural effusion -patient here with a loculated, exudative left-sided pleural effusion.  Pulmonology consulted and had a chest tube placed 8/17 and removed 8/21, and a CT scan showed a trapped lung likely suggesting longstanding effusion.   - Thoracic surgery also consulted s/p robotic decortication 8/24,  postoperative cultures with no growth. -Chest tube management per CT surgery, posterior chest tube  discontinued 8/26.  Repeat chest x-ray stable, no airleak, second chest tube discontinued 8/27.   -- Intraoperative culture remains negative, patient was treated on IV Flagyl and Rocephin, did switch  to Augmentin 8/28 , need another 7 days of oral Augmentin on discharge . - Need to follow with Dr. Dorris Fetch as an outpatient  for chest x-ray and chest tube suture removal at that time.    Acute hypoxic respiratory failure-resolved, he remains on room air, he was encouraged to use incentive spirometer  History of A. Fib -patient had few episodes of intermittent bradycardia and metoprolol has been discontinued on 8/22. Initially  Anticoagulation is on hold.  Resumed Xarelto after chest tubes were discontinued.   Essential hypertension-continue metoprolol   Hyponatremia -stable , asymptomatic   CAD, status post CABG 2021-no chest pain, stable ,aspirin can be resumed at a later date after he is stable.   Hyperlipidemia-continue statin   Normocytic anemia-of chronic disease    Discharge Diagnoses:  Principal Problem:   Pleural effusion, left Active Problems:   Atrial fibrillation (HCC)   Benign hypertension with CKD (chronic kidney disease) stage III   Pure hypercholesterolemia   Long term current use of anticoagulant   CAD, multiple vessel   Pleural effusion   S/P thoracentesis    Discharge Instructions  Discharge Instructions     Diet - low sodium heart healthy   Complete by: As directed    Increase activity slowly   Complete by: As directed    No wound care   Complete by: As directed       Allergies as of 07/07/2021   No Known Allergies      Medication List     STOP taking these medications    acetaminophen 500 MG tablet Commonly known as: TYLENOL   aspirin EC 81 MG tablet   olmesartan 5 MG tablet Commonly known as: BENICAR       TAKE these medications  amoxicillin-clavulanate 875-125 MG tablet Commonly known as: AUGMENTIN Take 1 tablet by mouth every 12 (twelve) hours for 7 days.   metoprolol tartrate 25 MG tablet Commonly known as: LOPRESSOR Take 0.5 tablets (12.5 mg total) by mouth daily.   polyethylene glycol 17 g packet Commonly  known as: MIRALAX / GLYCOLAX Take 17 g by mouth daily as needed for mild constipation.   rosuvastatin 20 MG tablet Commonly known as: CRESTOR Take 1 tablet (20 mg total) by mouth at bedtime.   traMADol 50 MG tablet Commonly known as: ULTRAM Take 1-2 tablets (50-100 mg total) by mouth every 6 (six) hours as needed (mild pain).   traZODone 100 MG tablet Commonly known as: DESYREL Take 1 tablet (100 mg total) by mouth at bedtime as needed for sleep.   Xarelto 20 MG Tabs tablet Generic drug: rivaroxaban TAKE 1 TABLET BY MOUTH IN  THE EVENING AFTER DINNER What changed: See the new instructions.        Follow-up Information     Loreli SlotHendrickson, Steven C, MD Follow up on 07/14/2021.   Specialty: Cardiothoracic Surgery Why: Appointment is at 10:00, [facility] 9:30 [facility] located on first floor of our office building Contact information: [address] [place] [address] [phone]                No Known Allergies  Consultations: CT surgery   Procedures/Studies: DG Chest 1 View  Result Date: 06/26/2021 CLINICAL DATA:  Left-sided pneumothorax EXAM: CHEST  1 VIEW COMPARISON:  06/25/2021 FINDINGS: Single frontal view of the chest demonstrates persistent left-sided hydropneumothorax without significant change since prior exam. Stable pigtail drainage catheter coiled over the left lower hemithorax. Increased density at the left lung base consistent with areas of lung consolidation and pleural fluid seen on preceding CT. Right chest is clear. Postsurgical changes from median sternotomy again noted. The cardiac silhouette is stable. IMPRESSION: 1. Stable left-sided pigtail drainage catheter, with stable persistent small left hydropneumothorax as above. Electronically Signed   By: Sharlet SalinaMichael  Brown M.D.   On: 06/26/2021 15:07   DG Chest 1 View  Result Date: 06/23/2021 CLINICAL DATA:  Status post left thoracentesis. EXAM: CHEST  1 VIEW COMPARISON:   June 22, 2021. FINDINGS: Stable cardiomediastinal silhouette. No pneumothorax is noted. Large loculated left pleural effusion is mildly decreased compared to prior exam. IMPRESSION: No pneumothorax status post left-sided thoracentesis. Electronically Signed   By: Lupita RaiderJames  Green Jr M.D.   On: 06/23/2021 11:30   DG Chest 2 View  Result Date: 07/07/2021 CLINICAL DATA:  Chest tube removal, follow-up pneumothorax, pleural effusion EXAM: CHEST - 2 VIEW COMPARISON:  07/05/2021 FINDINGS: Normal heart size post median sternotomy and LEFT atrial appendage clipping. Atherosclerotic calcification aorta. Mediastinal contours and pulmonary vascularity normal. Persistent loculated LEFT pleural effusion and LEFT basilar atelectasis. Small loculated pneumothorax LEFT base unchanged. RIGHT lung otherwise clear. Calcified granuloma LEFT upper lobe. Bones demineralized. IMPRESSION: Loculated LEFT pleural effusion with persistent small loculated hydropneumothorax at LEFT lung base. Aortic Atherosclerosis (ICD10-I70.0). Electronically Signed   By: Ulyses SouthwardMark  Boles M.D.   On: 07/07/2021 10:37   DG Chest 2 View  Result Date: 07/05/2021 CLINICAL DATA:  Follow-up.  Shortness of breath. EXAM: CHEST - 2 VIEW COMPARISON:  07/04/2021 FINDINGS: Removal of left-sided chest tube. Small, loculated hydropneumothorax appears similar to the previous exam. Previous median sternotomy. Left atrial appendage clip. Status post CABG. Atelectasis is identified within the left lower lobe. Right lung clear. IMPRESSION: Stable small,  loculated left hydropneumothorax status post chest tube removal. Unchanged. Similar appearance of left base atelectasis. Electronically Signed   By: Signa Kell M.D.   On: 07/05/2021 08:27   DG Chest 2 View  Result Date: 06/22/2021 CLINICAL DATA:  Severe shortness of breath EXAM: CHEST - 2 VIEW COMPARISON:  09/28/2020 FINDINGS: Large left pleural effusion with only a small area of aerated lung remaining. Right lung is  clear. No pleural effusion or pneumothorax. Heart and mediastinal contours are unremarkable. Prior CABG. No acute osseous abnormality. IMPRESSION: Large left pleural effusion with only a small area of aerated lung remaining. Electronically Signed   By: Elige Ko M.D.   On: 06/22/2021 17:12   CT CHEST WO CONTRAST  Result Date: 06/25/2021 CLINICAL DATA:  Pneumothorax concern for trapped lung, pleural thickening due to chronic left pleural effusion EXAM: CT CHEST WITHOUT CONTRAST TECHNIQUE: Multidetector CT imaging of the chest was performed following the standard protocol without IV contrast. COMPARISON:  Radiograph 06/25/2021 FINDINGS: Cardiovascular: Normal cardiac size.Prior CABG. No pericardial disease.Mildly enlarged branch pulmonary arteries.Calcified aortic atherosclerotic calcifications. Mediastinum/Nodes: Multiple prominent mediastinal lymph nodes, similar to prior exam. Unchanged 1.3 cm hypodense right thyroid nodule. Lungs/Pleura: There is a moderate size left-sided hydropneumothorax with multiple locules of gas within fluid in the lung base. Volume loss in the left lung. Thickened visceral pleura of the left lung. There is a left basilar chest tube in place within the pleural space. Multiple calcified granulomas in the left upper lobe. The right lung is clear. Airways are patent. Upper Abdomen: Pneumobilia.  Small hiatal hernia. Musculoskeletal: No acute osseous abnormality.No suspicious lytic or blastic lesions. IMPRESSION: Left-sided hydropneumothorax with parenchymal volume loss and thickened visceral pleura, compatible with a trapped lung. Left basilar chest tube is in place. Multiple prominent mediastinal lymph nodes, similar prior exam and likely reactive. Electronically Signed   By: Caprice Renshaw M.D.   On: 06/25/2021 18:36   CT Angio Chest PE W and/or Wo Contrast  Result Date: 06/22/2021 CLINICAL DATA:  3-4 weeks of shortness of breath and cough, initial encounter EXAM: CT ANGIOGRAPHY  CHEST WITH CONTRAST TECHNIQUE: Multidetector CT imaging of the chest was performed using the standard protocol during bolus administration of intravenous contrast. Multiplanar CT image reconstructions and MIPs were obtained to evaluate the vascular anatomy. CONTRAST:  75mL OMNIPAQUE IOHEXOL 350 MG/ML SOLN COMPARISON:  Chest x-ray from earlier in the same day. FINDINGS: Cardiovascular: Thoracic aorta demonstrates atherosclerotic calcification without aneurysmal dilatation. No cardiac enlargement is seen. Coronary arteries demonstrate diffuse calcifications. Pulmonary artery shows a normal branching pattern. No filling defects are identified on the right. The pulmonary artery on the left is somewhat attenuated peripherally due to collapse of the left lung related to the known pleural effusion. Postsurgical changes are noted consistent with coronary bypass grafting and Maze procedure. Mediastinum/Nodes: Thoracic inlet is within normal limits. Scattered small mediastinal lymph nodes are noted. The esophagus as visualized is within normal limits. Lungs/Pleura: Right lung is well aerated without focal infiltrate or sizable effusion. A few calcified granulomas are seen. Left lung shows near complete collapse secondary to a large pleural effusion. Some fullness is noted in the left infrahilar region although a discrete enhancing mass is not visualized. These changes are likely related to consolidation of the collapsed lung. Scattered granulomas in the left lung are noted as well. Upper Abdomen: Gallbladder has been surgically removed. Pneumobilia is seen likely related to prior intervention. No new focal abnormality in the upper abdomen is seen. Musculoskeletal: Degenerative changes  of the thoracic spine are seen. No definitive rib abnormality is noted. Review of the MIP images confirms the above findings. IMPRESSION: Large left pleural effusion with apparent underlying consolidation of the majority of the left lung. No  discrete mass is seen although evaluation is difficult due to the collapsed lung. No evidence of pulmonary emboli. No other focal abnormality is noted. Aortic Atherosclerosis (ICD10-I70.0). Electronically Signed   By: Alcide Clever M.D.   On: 06/22/2021 19:51   DG Chest Port 1 View  Result Date: 07/04/2021 CLINICAL DATA:  Shortness of breath. EXAM: PORTABLE CHEST 1 VIEW COMPARISON:  July 03, 2021. FINDINGS: Stable cardiomediastinal silhouette. Sternotomy wires are noted. Right lung is clear. Stable position of left-sided chest tube. Stable small left pleural effusion is noted with associated basilar atelectasis. No definite pneumothorax is noted. Bony thorax is unremarkable. IMPRESSION: Stable left pleural effusion with associated atelectasis. Electronically Signed   By: Lupita Raider M.D.   On: 07/04/2021 09:29   DG CHEST PORT 1 VIEW  Result Date: 07/03/2021 CLINICAL DATA:  Chest tube present.  Pneumothorax. EXAM: PORTABLE CHEST 1 VIEW COMPARISON:  07/02/2021 FINDINGS: Stable position of the left chest tube near the left lung apex. Stable volume loss in left hemithorax. Persistent densities at the left lung base. Postsurgical changes involving the heart. Right lung remains clear. Persistent subcutaneous gas in the left chest and left neck. No definite pneumothorax. IMPRESSION: 1. Stable position of the left chest tube.  No pneumothorax. 2. Stable volume loss in left hemithorax. Persistent densities at the left lung base. Electronically Signed   By: Richarda Overlie M.D.   On: 07/03/2021 08:58   DG CHEST PORT 1 VIEW  Result Date: 07/02/2021 CLINICAL DATA:  Chest tube EXAM: PORTABLE CHEST 1 VIEW COMPARISON:  07/01/2021 FINDINGS: Left chest tube remains in place. Left pleural thickening/fluid unchanged. Left lower lobe airspace disease unchanged. Small pneumothorax has resolved on the left. Soft tissue gas in the left lower neck unchanged. Open heart surgery changes. Right lung clear. No heart failure or  edema. IMPRESSION: Left chest tube remains in place.  No pneumothorax. Electronically Signed   By: Marlan Palau M.D.   On: 07/02/2021 09:28   DG Chest Port 1 View  Result Date: 07/01/2021 CLINICAL DATA:  Shortness of breath. EXAM: PORTABLE CHEST 1 VIEW COMPARISON:  Chest x-ray 06/29/2021. FINDINGS: There is a new left-sided chest tube with distal tip near the apex. Small left apical pneumothorax persists measuring 2.4 cm from the apex. Compared to the prior examination, pneumothorax has decreased. Small left pleural effusion persists. There is no mediastinal shift. The cardiomediastinal silhouette is prominent in size and unchanged. Patient is status post cardiac surgery. Patchy airspace opacities in the left lung base are stable from the prior study. Calcified nodular density in the right lung base is unchanged from the prior study. The osseous structures are unchanged. IMPRESSION: 1. New left chest tube in place. 2. Small left pneumothorax has decreased in size, but persists. 3. Small left pleural effusion. 4. Stable left basilar atelectasis/airspace disease. Electronically Signed   By: Darliss Cheney M.D.   On: 07/01/2021 14:46   DG Chest Port 1 View  Result Date: 06/29/2021 CLINICAL DATA:  Chest tube EXAM: PORTABLE CHEST 1 VIEW COMPARISON:  06/28/2021 FINDINGS: Persistent left-sided pneumothorax, similar to yesterday's study. Right clear. The cardiopericardial silhouette is within normal limits for size. The visualized bony structures of the thorax show no acute abnormality. Telemetry leads overlie the chest. IMPRESSION: No substantial  change in left-sided pneumothorax. Electronically Signed   By: Kennith Center M.D.   On: 06/29/2021 08:47   DG CHEST PORT 1 VIEW  Result Date: 06/28/2021 CLINICAL DATA:  Left chest tube removed this am, this is a f/u, pt still having sob and a productive cough EXAM: PORTABLE CHEST - 1 VIEW COMPARISON:  Earlier film of the same day FINDINGS: Removal of pigtail chest  tube from the left lung base. Left lateral and apical hydropneumothorax is unchanged from previous day's exam, the lung apex projecting below the posterior aspect left third rib. Right lung clear. Heart size stable.  Left atrial clip.  Sternotomy wires. IMPRESSION: Removal left chest tube with persistent left lateral and apical hydropneumothorax Electronically Signed   By: Corlis Leak M.D.   On: 06/28/2021 16:13   DG CHEST PORT 1 VIEW  Result Date: 06/28/2021 CLINICAL DATA:  Shortness of breath and cough. Evaluate left-sided pleural effusion and pneumothorax. EXAM: PORTABLE CHEST 1 VIEW COMPARISON:  Yesterday FINDINGS: Prior median sternotomy. Left atrial appendage occlusion device. Mild cardiomegaly. Clear right lung. Small left-sided hydropneumothorax is minimally improved with decreased air at the left apex. The amount of inferolateral loculated pleural air is similar. A small bore catheter projects over the lower left chest. Persistent left base atelectasis. IMPRESSION: Minimal decrease in small left-sided hydropneumothorax. Similar adjacent left lower lobe atelectasis. Electronically Signed   By: Jeronimo Greaves M.D.   On: 06/28/2021 12:09   DG CHEST PORT 1 VIEW  Result Date: 06/27/2021 CLINICAL DATA:  Follow-up left-sided pneumothorax EXAM: PORTABLE CHEST 1 VIEW COMPARISON:  1 day prior FINDINGS: Pigtail catheter within the lower left chest, presumably pleural in position, similar. Patient rotated left. Mild cardiomegaly. Left atrial appendage occlusion device. Prior median sternotomy. Left-sided hydropneumothorax again identified. Slightly increased, with the visceral pleural line 3.9 cm from the apical chest wall today versus 2.6 cm on the prior exam. No congestive failure. Clear right lung. Persistent left base atelectasis. IMPRESSION: Small left-sided hydropneumothorax, minimally increased. Persistent left base atelectasis. Electronically Signed   By: Jeronimo Greaves M.D.   On: 06/27/2021 08:08   DG  CHEST PORT 1 VIEW  Result Date: 06/25/2021 CLINICAL DATA:  Shortness of breath EXAM: PORTABLE CHEST 1 VIEW COMPARISON:  06/24/2021 FINDINGS: Pigtail drainage catheter is seen near the left costophrenic angle, more lateral in position compared to the previous exam. New small-moderate left-sided pneumothorax, approximately 15% volume. Near complete resolution of previously seen left-sided pleural effusion. Stable heart size status post CABG. No abnormal shift of the heart or mediastinal structures. Right lung remains clear. IMPRESSION: 1. New small-moderate left-sided pneumothorax (approximately 15% volume). Pigtail drainage catheter at the left costophrenic angle, more lateral in position compared to previous exam. 2. Resolved left pleural effusion. These results will be called to the ordering clinician or representative by the Radiologist Assistant, and communication documented in the PACS or [employer]. Electronically Signed   By: Duanne Guess D.O.   On: 06/25/2021 08:41   DG CHEST PORT 1 VIEW  Result Date: 06/24/2021 CLINICAL DATA:  Left chest tube placement EXAM: PORTABLE CHEST 1 VIEW COMPARISON:  06/24/2021 FINDINGS: A pigtail drainage catheter projects over the left upper abdominal quadrant, which may be located within the inferior aspect posterior pleural space. Evaluation is limited by lack of orthogonal view. Moderate-large loculated left-sided pleural effusion, slightly decreased in volume compared to the prior study. Slightly improving aeration within the left lung field. Right lung remains clear. Stable heart size status post CABG. No pneumothorax.  IMPRESSION: A pigtail drainage catheter projects over the left upper abdominal quadrant, which is presumably located within the inferior aspect posterior pleural space. Evaluation is limited by lack of orthogonal view. Slight interval decrease in left-sided pleural effusion with slightly improving aeration of the left lung field. No  pneumothorax. Electronically Signed   By: Duanne Guess D.O.   On: 06/24/2021 16:01   DG CHEST PORT 1 VIEW  Result Date: 06/24/2021 CLINICAL DATA:  Shortness of breath. EXAM: PORTABLE CHEST 1 VIEW COMPARISON:  Chest x-ray from yesterday. FINDINGS: Stable cardiomediastinal silhouette. Unchanged large loculated left pleural effusion with associated left lung atelectasis. The right lung is clear. No pneumothorax. No acute osseous abnormality. IMPRESSION: 1. Unchanged large loculated left pleural effusion. Electronically Signed   By: Obie Dredge M.D.   On: 06/24/2021 07:06   IR THORACENTESIS ASP PLEURAL SPACE W/IMG GUIDE  Result Date: 06/23/2021 INDICATION: Shortness of breath, pleural effusion seen on previous CXR. Request for therapeutic and diagnostic thoracentesis. EXAM: ULTRASOUND GUIDED LEFT THORACENTESIS MEDICATIONS: 10 mL 1% lidocaine COMPLICATIONS: None immediate. PROCEDURE: An ultrasound guided thoracentesis was thoroughly discussed with the patient and questions answered. The benefits, risks, alternatives and complications were also discussed. The patient understands and wishes to proceed with the procedure. Written consent was obtained. Ultrasound was performed to localize and mark an adequate pocket of fluid in the left chest. The area was then prepped and draped in the normal sterile fashion. 1% Lidocaine was used for local anesthesia. Under ultrasound guidance a 6 Fr Safe-T-Centesis catheter was introduced. Thoracentesis was performed. The catheter was removed and a dressing applied. FINDINGS: A total of approximately 1.2 L of hazy amber fluid was removed. Samples were sent to the laboratory as requested by the clinical team. Post procedure chest X-ray reviewed, negative for pneumothorax. IMPRESSION: Successful ultrasound guided left thoracentesis yielding 1.2 L of pleural fluid. Read by: Lawernce Ion, PA-C Electronically Signed   By: Acquanetta Belling M.D.   On: 06/23/2021 11:48       Subjective:  reports he ambulated with PT yesterday, out of bed to chair, he has been doing his incentive spirometer, able to produce some phlegm.  Discharge Exam: Vitals:   07/07/21 0700 07/07/21 0756  BP: (!) 137/104   Pulse:    Resp:    Temp: 98.6 F (37 C)   SpO2:  96%   Vitals:   07/06/21 1612 07/06/21 2027 07/07/21 0700 07/07/21 0756  BP: 139/77 131/82 (!) 137/104   Pulse: 75     Resp:      Temp: 97.8 F (36.6 C) 98.6 F (37 C) 98.6 F (37 C)   TempSrc: Oral Oral    SpO2: 96%   96%  Weight:  90.9 kg    Height:        General: Pt is alert, awake, not in acute distress Cardiovascular: RRR, S1/S2 +, no rubs, no gallops Respiratory: Right lung clear to auscultation left lung with coarse breath sounds mainly in the base area. Abdominal: Soft, NT, ND, bowel sounds + Extremities: no edema, no cyanosis    The results of significant diagnostics from this hospitalization (including imaging, microbiology, ancillary and laboratory) are listed below for reference.     Microbiology: Recent Results (from the past 240 hour(s))  Aerobic/Anaerobic Culture w Gram Stain (surgical/deep wound)     Status: None   Collection Time: 07/01/21  8:46 AM   Specimen: PATH Cytology Pleural fluid; Body Fluid  Result Value Ref Range Status   Specimen Description  PLEURAL FLUID  Final   Special Requests LEFT PLEURAL FLUID SPEC A  Final   Gram Stain   Final    MODERATE WBC PRESENT,BOTH PMN AND MONONUCLEAR NO ORGANISMS SEEN    Culture   Final    No growth aerobically or anaerobically. Performed at [facility], [address], [place], [place] [address]    Report Status 07/06/2021 FINAL  Final  Fungus Stain     Status: None   Collection Time: 07/01/21  8:46 AM   Specimen: PATH Cytology Pleural fluid; Body Fluid  Result Value Ref Range Status   FUNGUS STAIN Final report  Final    Comment: (NOTE) Performed At: [facility] [address] [place], [place]  [id number] Jolene Schimke MD XB:[id number]    Fungal Source PLEURAL  Final    Comment: FLUID Performed at [facility], [address], [place], [place] [address]   Acid Fast Smear (AFB)     Status: None   Collection Time: 07/01/21  8:46 AM   Specimen: PATH Cytology Pleural fluid; Body Fluid  Result Value Ref Range Status   AFB Specimen Processing Concentration  Final   Acid Fast Smear Negative  Final    Comment: (NOTE) Performed At: [facility] [address] [place], [place] [id number] Jolene Schimke MD MW:[id number]    Source (AFB) LEFT PLEURAL FLUID SPEC A  Final    Comment: Performed at [facility], [address], [place], [place] [address]  Fungal Stain reflex     Status: None   Collection Time: 07/01/21  8:46 AM  Result Value Ref Range Status   Fungal stain result 1 Comment  Final    Comment: (NOTE) KOH/Calcofluor preparation:  no fungus observed. Performed At: [facility] [address] [place], [place] [id number] Jolene Schimke MD VZ:[id number]      Labs: BNP (last 3 results) Recent Labs    07/24/20 0748 09/27/20 0509 06/22/21 1620  BNP 206.5* 151.4* 244.4*   Basic Metabolic Panel: Recent Labs  Lab 07/01/21 0258 07/02/21 0039 07/03/21 0136 07/04/21 0051 07/05/21 0048  NA 133* 131* 131* 131* 132*  K 4.3 4.2 3.7 3.6 3.5  CL 97* 97* 99 98 97*  CO2 GLUCOSE 93 96 90 87 90  BUN 7* 6*  CREATININE 1.01 0.96 0.84 0.77 0.83  CALCIUM 8.3* 8.1* 7.9* 7.9* 8.0*   Liver Function Tests: Recent Labs  Lab 07/01/21 0258 07/03/21 0136  AST 13* 12*  ALT 7 8  ALKPHOS 86 73  BILITOT 1.0 0.9  PROT 5.8* 5.4*  ALBUMIN 2.1* 2.0*   No results for input(s): LIPASE, AMYLASE in the last 168 hours. No results for input(s): AMMONIA in the last 168 hours. CBC: Recent Labs  Lab 07/01/21 1400 07/02/21 0039 07/03/21 0136 07/04/21 0051 07/05/21 0048  WBC 11.7* 9.4 8.8 6.6 6.9  HGB 8.5* 9.1* 8.9* 8.4* 8.5*  HCT  28.3* 29.9* 29.0* 28.0* 28.5*  MCV 85.0 84.2 83.1 84.3 83.8  PLT 332 321 340 366 400   Cardiac Enzymes: No results for input(s): CKTOTAL, CKMB, CKMBINDEX, TROPONINI in the last 168 hours. BNP: Invalid input(s): POCBNP CBG: No results for input(s): GLUCAP in the last 168 hours. D-Dimer No results for input(s): DDIMER in the last 72 hours. Hgb A1c No results for input(s): HGBA1C in the last 72 hours. Lipid Profile No results for input(s): CHOL, HDL, LDLCALC, TRIG, CHOLHDL, LDLDIRECT in the last 72  hours. Thyroid function studies No results for input(s): TSH, T4TOTAL, T3FREE, THYROIDAB in the last 72 hours.  Invalid input(s): FREET3 Anemia work up No results for input(s): VITAMINB12, FOLATE, FERRITIN, TIBC, IRON, RETICCTPCT in the last 72 hours. Urinalysis    Component Value Date/Time   COLORURINE YELLOW 09/23/2020 2135   APPEARANCEUR HAZY (A) 09/23/2020 2135   LABSPEC 1.041 (H) 09/23/2020 2135   PHURINE 5.0 09/23/2020 2135   GLUCOSEU NEGATIVE 09/23/2020 2135   HGBUR LARGE (A) 09/23/2020 2135   BILIRUBINUR NEGATIVE 09/23/2020 2135   KETONESUR 5 (A) 09/23/2020 2135   PROTEINUR 30 (A) 09/23/2020 2135   NITRITE NEGATIVE 09/23/2020 2135   LEUKOCYTESUR TRACE (A) 09/23/2020 2135   Sepsis Labs Invalid input(s): PROCALCITONIN,  WBC,  LACTICIDVEN Microbiology Recent Results (from the past 240 hour(s))  Aerobic/Anaerobic Culture w Gram Stain (surgical/deep wound)     Status: None   Collection Time: 07/01/21  8:46 AM   Specimen: PATH Cytology Pleural fluid; Body Fluid  Result Value Ref Range Status   Specimen Description PLEURAL FLUID  Final   Special Requests LEFT PLEURAL FLUID SPEC A  Final   Gram Stain   Final    MODERATE WBC PRESENT,BOTH PMN AND MONONUCLEAR NO ORGANISMS SEEN    Culture   Final    No growth aerobically or anaerobically. Performed at [facility], [address], [place], [place] [address]    Report Status 07/06/2021 FINAL  Final  Fungus Stain      Status: None   Collection Time: 07/01/21  8:46 AM   Specimen: PATH Cytology Pleural fluid; Body Fluid  Result Value Ref Range Status   FUNGUS STAIN Final report  Final    Comment: (NOTE) Performed At: [facility] [address] [place], [place] [id number] Jolene Schimke MD XB:[id number]    Fungal Source PLEURAL  Final    Comment: FLUID Performed at [facility], [address], [place], [place] [address]   Acid Fast Smear (AFB)     Status: None   Collection Time: 07/01/21  8:46 AM   Specimen: PATH Cytology Pleural fluid; Body Fluid  Result Value Ref Range Status   AFB Specimen Processing Concentration  Final   Acid Fast Smear Negative  Final    Comment: (NOTE) Performed At: [facility] [address] [place], [place] [id number] Jolene Schimke MD [id number]    Source (AFB) LEFT PLEURAL FLUID SPEC A  Final    Comment: Performed at [facility], [address], [place], [place] [address]  Fungal Stain reflex     Status: None   Collection Time: 07/01/21  8:46 AM  Result Value Ref Range Status   Fungal stain result 1 Comment  Final    Comment: (NOTE) KOH/Calcofluor preparation:  no fungus observed. Performed At: [facility] [address] [place], [place] [id number] Jolene Schimke MD VZ:[id number]      Time coordinating discharge: Over 30 minutes  SIGNED:   Huey Bienenstock, MD  [facility] 07/07/2021, 11:18 AM Pager   If 7PM-7AM, please contact night-coverage [email or web address] Password [email or web address]

## 2021-07-07 NOTE — TOC Progression Note (Signed)
Transition of Care [facility]) - Progression Note    Patient Details  Name: [name] Rice MRN: [id number] Date of Birth: 1951-04-29  [facility]) CM/SW Contact  Ivette Loyal, [place] Phone Number: 07/07/2021, 5:19 PM  Clinical Narrative:    CSW spoke with Efraim Kaufmann at [facility] to confirm pt DC today. Melissa has bed for pt. CSW will contact pt brother as he has requested to transport pt bc he is about 2 hours away.   CSW attempted to contact pt brother via phone and text, the number on chart is incorrect. CSW contacted the SNF to advise them to provide the brother with CSW number as he is a PT at that facility. PT brother reached out to CSW and suggested picking pt up tomorrow bc it was so late in the evening. Csw will schedule DC for tomorrow around noon. The correct number is [phone].   Expected Discharge Plan: Skilled Nursing Facility Barriers to Discharge: (P) Continued Medical Work up  Expected Discharge Plan and Services Expected Discharge Plan: Skilled Nursing Facility     Post Acute Care Choice: (P) Skilled Nursing Facility Living arrangements for the past 2 months: (P) Single Family Home Expected Discharge Date: 07/07/21                                     Social Determinants of Health (SDOH) Interventions    Readmission Risk Interventions Readmission Risk Prevention Plan 09/25/2020  Transportation Screening Complete  PCP or Specialist Appt within 3-5 Days Complete  HRI or Home Care Consult Complete  Some recent data might be hidden

## 2021-07-07 NOTE — NC FL2 (Addendum)
[place] MEDICAID FL2 LEVEL OF CARE SCREENING TOOL     IDENTIFICATION  Patient Name: [name] Rice Birthdate: December 11, 1950 Sex: male Admission Date (Current Location): 06/22/2021  [facility] and [place] Number:  Producer, television/film/video and Address:  [facility], [address] [place], [place] [address]      Provider Number: [id number]  Attending Physician Name and Address:  Elgergawy, Leana Roe, MD  Relative Name and Phone Number:  Zaul, [facility])   [phone]    Current Level of Care: Hospital Recommended Level of Care: Skilled Nursing Facility Prior Approval Number:    Date Approved/Denied:   PASRR Number:  [id number]  Discharge Plan: SNF    Current Diagnoses: Patient Active Problem List   Diagnosis Date Noted   Pleural effusion, left 06/22/2021   Dyspnea    Pleural effusion    S/P thoracentesis    Budding yeast in U/A detected 09/24/2020   Generalized weakness 09/24/2020   Nausea and vomiting 09/23/2020   Right sided hydronephrosis with renal and ureteral calculus obstruction 09/23/2020   Benign hypertension    Abnormal magnetic resonance cholangiopancreatography (MRCP)    Common bile duct (CBD) obstruction    Elevated LFTs    Choledocholithiasis with acute cholecystitis    AKI (acute kidney injury) (HCC) 09/15/2020   Sepsis (HCC) 09/15/2020   Abdominal pain 09/15/2020   Abnormal CT of the abdomen    Abnormal liver enzymes    Acute cholecystitis 09/14/2020   Malnutrition of moderate degree (HCC) 08/07/2020   Acute blood loss anemia 08/07/2020   CAD, multiple vessel 08/07/2020   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   Postural dizziness with presyncope 07/24/2020   Hypokalemia 07/24/2020   Diarrhea 07/24/2020   Lightheadedness 07/24/2020   Long term current use of anticoagulant 01/14/2020   Benign hypertension with CKD (chronic kidney disease) stage III 06/12/2019   Pure hypercholesterolemia 06/12/2019   Atrial fibrillation (HCC)  09/25/2016    Orientation RESPIRATION BLADDER Height & Weight     Time, Situation, Place, Self  Normal Continent Weight: 200 lb 6.4 oz (90.9 kg) Height:  6\' 1"  (185.4 cm)  BEHAVIORAL SYMPTOMS/MOOD NEUROLOGICAL BOWEL NUTRITION STATUS      Continent Diet (See DC summary)  AMBULATORY STATUS COMMUNICATION OF NEEDS Skin       Surgical wounds (L Chest/closed)                       Personal Care Assistance Level of Assistance  Bathing, Feeding, Dressing Bathing Assistance: Limited assistance Feeding assistance: Independent Dressing Assistance: Limited assistance     Functional Limitations Info  Speech     Speech Info: Adequate    SPECIAL CARE FACTORS FREQUENCY  PT (By licensed PT), OT (By licensed OT)     PT Frequency: 5x a week OT Frequency: 5x a week            Contractures Contractures Info: Not present    Additional Factors Info  Code Status, Allergies Code Status Info: Full Allergies Info: NKA           Current Medications (07/07/2021):  This is the current hospital active medication list Current Facility-Administered Medications  Medication Dose Route Frequency Provider Last Rate Last Admin   amoxicillin-clavulanate (AUGMENTIN) 875-125 MG per tablet 1 tablet  1 tablet Oral Q12H Elgergawy, 07/09/2021, MD   1 tablet at 07/07/21 0918   bisacodyl (DULCOLAX) EC tablet 10 mg  10 mg Oral Daily Barrett, Erin R, PA-C  10 mg at 07/03/21 1044   Chlorhexidine Gluconate Cloth 2 % PADS 6 each  6 each Topical Daily Loreli Slot, MD   6 each at 07/07/21 [phone]   fentaNYL (SUBLIMAZE) injection 25-50 mcg  25-50 mcg Intravenous Q2H PRN Barrett, Erin R, PA-C   50 mcg at 07/02/21 1410   guaiFENesin-dextromethorphan (ROBITUSSIN DM) 100-10 MG/5ML syrup 5 mL  5 mL Oral Q4H PRN Elgergawy, Leana Roe, MD   5 mL at 07/06/21 2133   morphine 2 MG/ML injection 2 mg  2 mg Intravenous Q4H PRN Barrett, Erin R, PA-C   2 mg at 06/28/21 2042   ondansetron (ZOFRAN) injection 4 mg  4 mg  Intravenous Q6H PRN Barrett, Erin R, PA-C   4 mg at 07/05/21 1326   oxyCODONE (Oxy IR/ROXICODONE) immediate release tablet 5-10 mg  5-10 mg Oral Q4H PRN Barrett, Erin R, PA-C   10 mg at 07/04/21 1117   polyethylene glycol (MIRALAX / GLYCOLAX) packet 17 g  17 g Oral Daily PRN Barrett, Erin R, PA-C       rivaroxaban (XARELTO) tablet 20 mg  20 mg Oral Q supper Elgergawy, Leana Roe, MD   20 mg at 07/06/21 1639   rosuvastatin (CRESTOR) tablet 20 mg  20 mg Oral Daily Barrett, Erin R, PA-C   20 mg at 07/07/21 0919   senna-docusate (Senokot-S) tablet 1 tablet  1 tablet Oral QHS Barrett, Erin R, PA-C   1 tablet at 07/02/21 2139   sodium chloride flush (NS) 0.9 % injection 3 mL  3 mL Intravenous Q12H Barrett, Erin R, PA-C   3 mL at 07/07/21 0919   traMADol (ULTRAM) tablet 50-100 mg  50-100 mg Oral Q6H PRN Barrett, Erin R, PA-C   100 mg at 07/07/21 3013   traZODone (DESYREL) tablet 100 mg  100 mg Oral QHS Elgergawy, Leana Roe, MD   100 mg at 07/06/21 2133     Discharge Medications: Please see discharge summary for a list of discharge medications.  Relevant Imaging Results:  Relevant Lab Results:   Additional Information SSN [id number]; PFIZER Comrnaty(Gray TOP) Covid-19 Vaccine 07/06/2021  Pfizer COVID-19 Vaccine 01/04/2020 , 12/13/2019  Ivette Loyal, LCSWA

## 2021-07-07 NOTE — TOC Initial Note (Addendum)
Transition of Care [facility]) - Initial/Assessment Note    Patient Details  Name: [name] Rice MRN: [id number] Date of Birth: 02-11-1951  [facility]) CM/SW Contact:    Tresa Endo Phone Number: 07/07/2021, 2:26 PM  Clinical Narrative:                 CSW received SNF consult. CSW met with pt at bedside. CSW introduced self and explained role at the hospital. Pt reports that PTA the pt lived home alone with support from him brother. PT reports pt unable to get groceries or do IADLs at this time. Pt may benefit from SNF to achieve safe mod I level of function to return home alone as pt has no family or friends to assist him. PT reports pt needs minimum assistance and and has walked 183f with a rolling walker.  CSW reviewed PT/OT recommendations for SNF. Pt reports he is agreeable to SNF. Pt gave CSW permission to fax out to facilities in his area. [place] [facility] [place] and pt bother is a PT at the facility.   CSW will continue to follow.    Expected Discharge Plan: Skilled Nursing Facility Barriers to Discharge: (P) Continued Medical Work up   Patient Goals and CMS Choice Patient states their goals for this hospitalization and ongoing recovery are:: (P) Rehab CMS Medicare.gov Compare Post Acute Care list provided to:: (P) Patient Choice offered to / list presented to : (P) Patient  Expected Discharge Plan and Services Expected Discharge [place]: [place]: (P) [place] for the past 2 months: (P) Single Family Home Expected Discharge Date: 07/07/21                                    Prior Living Arrangements/Services Living arrangements for the past 2 months: (P) Single Family Home                     Activities of Daily Living Home Assistive Devices/Equipment: WEnvironmental consultant(specify type) (rolarer and a walker. Not using  it) ADL Screening (condition at time of admission) Patient's cognitive ability adequate to safely complete daily activities?: Yes Is the patient deaf or have difficulty hearing?: No Does the patient have difficulty seeing, even when wearing glasses/contacts?: No Does the patient have difficulty concentrating, remembering, or making decisions?: No Patient able to express need for assistance with ADLs?: Yes Does the patient have difficulty dressing or bathing?: No Independently performs ADLs?: Yes (appropriate for developmental age) Does the patient have difficulty walking or climbing stairs?: No Weakness of Legs: None Weakness of Arms/Hands: None  Permission Sought/Granted                  Emotional Assessment              Admission diagnosis:  SOB (shortness of breath) [R06.02] Pleural effusion [J90] Pleural effusion, left [J90] Patient Active Problem List   Diagnosis Date Noted   Pleural effusion, left 06/22/2021   Dyspnea    Pleural effusion    S/P thoracentesis    Budding yeast in U/A detected 09/24/2020   Generalized weakness 09/24/2020   Nausea and vomiting 09/23/2020   Right sided hydronephrosis with renal and ureteral calculus obstruction 09/23/2020   Benign hypertension    Abnormal  magnetic resonance cholangiopancreatography (MRCP)    Common bile duct (CBD) obstruction    Elevated LFTs    Choledocholithiasis with acute cholecystitis    AKI (acute kidney injury) ([place]) 09/15/2020   Sepsis ([place]) 09/15/2020   Abdominal pain 09/15/2020   Abnormal CT of the abdomen    Abnormal liver enzymes    Acute cholecystitis 09/14/2020   Malnutrition of moderate degree ([place]) 08/07/2020   Acute blood loss anemia 08/07/2020   CAD, multiple vessel 08/07/2020   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   Postural dizziness with presyncope 07/24/2020   Hypokalemia 07/24/2020   Diarrhea 07/24/2020   Lightheadedness 07/24/2020   Long term current use of anticoagulant 01/14/2020    Benign hypertension with CKD (chronic kidney disease) stage III 06/12/2019   Pure hypercholesterolemia 06/12/2019   Atrial fibrillation ([place]) 09/25/2016   PCP:  Aletha Halim., PA-C Pharmacy:   [id number] [place], [place] [address]Phone: [phone]: [phone] [facility]  ([place]Phone: [phone]: [phone]    Social Determinants of Health ([place] Interventions    Readmission Risk Interventions Readmission Risk Prevention Plan 09/25/2020  Transportation Screening Complete  PCP or Specialist Appt within 3-5 Days Complete  HRI or Home Care Consult Complete  Some recent data might be hidden

## 2021-07-07 NOTE — Progress Notes (Addendum)
      [address]       [place] [address]             [phone]       6 Days Post-Op Procedure(s) (LRB): XI ROBOTIC ASSISTED THORASCOPY FOR DECORTICATION (Left)  Subjective: Patient continues with a lot of sputum at night but able to expectorate  Objective: Vital signs in last 24 hours: Temp:  [97.6 F (36.4 C)-98.6 F (37 C)] 98.6 F (37 C) (08/29 2027) Pulse Rate:  [75-85] 75 (08/29 1612) Cardiac Rhythm: Other (Comment) (08/29 1609) Resp:  [17-19] 19 (08/29 1213) BP: (131-139)/(59-82) 131/82 (08/29 2027) SpO2:  [95 %-99 %] 96 % (08/29 1612) Weight:  [90.9 kg] 90.9 kg (08/29 2027)     Intake/Output from previous day: 08/29 0701 - 08/30 0700 In: 803 [P.O.:800; I.V.:3] Out: 175 [Urine:175]   Physical Exam:  Cardiovascular:IRRR IRRR Pulmonary: Clear to auscultation on the right;coarse breath sounds on the left Abdomen: Soft, non tender, bowel sounds present. Extremities:No lower extremity edema. Wounds: Clean and dry.  No erythema or signs of infection.   Lab Results: CBC: Recent Labs    07/05/21 0048  WBC 6.9  HGB 8.5*  HCT 28.5*  PLT 400    BMET:  Recent Labs    07/05/21 0048  NA 132*  K 3.5  CL 97*  CO2 27  GLUCOSE 90  BUN 6*  CREATININE 0.83  CALCIUM 8.0*     PT/INR: No results for input(s): LABPROT, INR in the last 72 hours. ABG:  INR: Will add last result for INR, ABG once components are confirmed Will add last 4 CBG results once components are confirmed  Assessment/Plan:  1. CV - History of a fib;a fib with CVR this am. On Rivaroxaban 20 mg every evening. He was also on Lopressor 12.5 mg daily prior to admission 2.  Pulmonary - On room air. Will check CXR .Encourage incentive spirometer. 3. Anemia-Last H and H stable at 8.5 and 28.5 4. ID-on Augmentin. Culture shows no growth 4 days. 5. Ok for discharge from CVTS once arrangements made;SNF recommended by PT  [name] M ZimmermanPA-C 07/07/2021,7:08  AM [phone]   Patient seen and examined, agree with above CXR shows a good result post decortication To SNF when bed available  [name] Spare C. Dorris Fetch, MD [facility] [phone]

## 2021-07-08 LAB — RESP PANEL BY RT-PCR (FLU A&B, COVID) ARPGX2
Influenza A by PCR: NEGATIVE
Influenza B by PCR: NEGATIVE
SARS Coronavirus 2 by RT PCR: NEGATIVE

## 2021-07-08 NOTE — Progress Notes (Signed)
RN went over d/c summary with pt. NT removed PIV. Belongings with pt. Pt's uncle to transport pt to SNF in private vehicle. Nt transported pt to vehicle.

## 2021-07-08 NOTE — Progress Notes (Signed)
S: Patient is without complaints this morning. No dyspnea or pain. No bleeding. No fever.   O: BP 121/69   Pulse 91   Temp 98.7 F (37.1 C) (Oral)   Resp 19   Ht 6\' 1"  (1.854 m)   Wt 88.6 kg   SpO2 96%   BMI 25.77 kg/m   Gen: Chronically ill-appearing male in no distress Pulm: Nonlabored breathing room air. Clear, diminished on left CV: Regular with rate in 80-90's without murmur, rub, or gallop. No JVD, no pitting dependent edema. GI: Abdomen soft, non-tender, non-distended, with normoactive bowel sounds.  Ext: Warm, no deformities Skin: No new rashes, lesions or ulcers on visualized skin. Scars on abdomen and chest noted. No exudate from former tube site. Neuro: Alert and oriented. No focal neurological deficits. Psych: Judgement and insight appear fair. Mood euthymic & affect congruent. Behavior is appropriate.    A/P: Risk was admitted 8/15 ultimately undergoing VATS/decortication for recurrent left pleural effusion on 8/24. Cultures have been unhelpful, though respiratory status is normalized and he has been cleared for discharge from cardiothoracic surgery's standpoint. In fact, he was discharged to [facility] 8/30 but was unable to secure transportation. We do have insurance authorization today, transportation verified, and covid screen repeatedly negative.   He remains stable for discharge with plans to complete 10 days of augmentin as indicated in discharge summary dated 8/30.  9/30, MD 07/08/2021 3:38 PM

## 2021-07-08 NOTE — TOC Transition Note (Signed)
Transition of Care [facility]) - CM/SW Discharge Note   Patient Details  Name: [name] Rice MRN: [id number] Date of Birth: Jan 24, 1951  [facility]) CM/SW Contact:  Lorri Frederick, LCSW Phone Number: 07/08/2021, 1:46 PM   Clinical Narrative:   Pt discharging to [facility] and rehab, room 418.  RN call report to [phone].     Final next level of care: Skilled Nursing Facility Barriers to Discharge: Barriers Resolved   Patient Goals and CMS Choice Patient states their goals for this hospitalization and ongoing recovery are:: (P) Rehab CMS Medicare.gov Compare Post Acute Care list provided to:: (P) Patient Choice offered to / list presented to : (P) Patient  Discharge Placement              Patient chooses bed at:  [facility]) Patient to be transferred to facility by: brother Tim Name of family member notified: brother Jorja Loa already here at hospital--no contact as he is aware    Discharge Plan and Services     Post Acute Care Choice: (P) Skilled Nursing Facility                               Social Determinants of Health (SDOH) Interventions     Readmission Risk Interventions Readmission Risk Prevention Plan 09/25/2020  Transportation Screening Complete  PCP or Specialist Appt within 3-5 Days Complete  HRI or Home Care Consult Complete  Some recent data might be hidden

## 2021-07-08 NOTE — Progress Notes (Addendum)
      [address]       [place] [address]             [phone]       7 Days Post-Op Procedure(s) (LRB): XI ROBOTIC ASSISTED THORASCOPY FOR DECORTICATION (Left)  Subjective: Patient states uncle is taking him to SNF today  Objective: Vital signs in last 24 hours: Temp:  [98.6 F (37 C)-98.8 F (37.1 C)] 98.6 F (37 C) (08/31 0345) Pulse Rate:  [91-97] 91 (08/31 0345) Cardiac Rhythm: Other (Comment) (08/30 0756) Resp:  [18-20] 20 (08/31 0345) BP: (129-133)/(66-78) 133/66 (08/31 0345) SpO2:  [93 %-97 %] 93 % (08/31 0345) Weight:  [88.6 kg] 88.6 kg (08/31 0345)     Intake/Output from previous day: 08/30 0701 - 08/31 0700 In: 483 [P.O.:480; I.V.:3] Out: 875 [Urine:875]   Physical Exam:  Cardiovascular:IRRR IRRR Pulmonary: Clear to auscultation on the right;coarse breath sounds on the left Abdomen: Soft, non tender, bowel sounds present. Extremities:No lower extremity edema. Wounds: Clean and dry.  No erythema or signs of infection. Chest tube wounds are clean and dry   Lab Results: CBC: No results for input(s): WBC, HGB, HCT, PLT in the last 72 hours.  BMET:  No results for input(s): NA, K, CL, CO2, GLUCOSE, BUN, CREATININE, CALCIUM in the last 72 hours.   PT/INR: No results for input(s): LABPROT, INR in the last 72 hours. ABG:  INR: Will add last result for INR, ABG once components are confirmed Will add last 4 CBG results once components are confirmed  Assessment/Plan:  1. CV - History of a fib. On Rivaroxaban 20 mg every evening. He was also on Lopressor 12.5 mg daily prior to admission 2.  Pulmonary - On room air. .Encourage incentive spirometer. 3. Anemia-Last H and H stable at 8.5 and 28.5 4. ID-on Augmentin. Culture shows no growth aerobically or anaerobically. Augmentin to be continued for a total of 10 days, as discussed with medical doctor on Monday.  5. Ok for discharge to SNF from CVTS once arrangements made  Lelon Huh  [facility] 07/08/2021,7:02 AM [phone]   Patient seen and examined, agree with above [facility] for DC from our standpoint CXR shows good result post decortication  Viviann Spare C. Dorris Fetch, MD [facility] [phone]

## 2021-07-08 NOTE — TOC Progression Note (Addendum)
Transition of Care [facility]) - Progression Note    Patient Details  Name: [name] Rice MRN: [id number] Date of Birth: [birth year]  Transition of Care [facility]) CM/SW Contact  Lorri Frederick, LCSW Phone Number: 07/08/2021, 10:13 AM  Clinical Narrative:   CSW received auth from [facility]: 3 days starting 8/31, plan auth ID: [id number].  CSW spoke with [facility] at [facility] confirming bed.  She has Dentist.  She needs FL2, DC summary and covid test result faxed.     1000: rapid covid ordered. 1330: DC summary, FL2, covid result all faxed to [facility] at [phone].      Expected Discharge Plan: Skilled Nursing Facility Barriers to Discharge: (P) Continued Medical Work up  Expected Discharge Plan and Services Expected Discharge Plan: Skilled Nursing Facility     Post Acute Care Choice: (P) Skilled Nursing Facility Living arrangements for the past 2 months: (P) Single Family Home Expected Discharge Date: 07/08/21                                     Social Determinants of Health (SDOH) Interventions    Readmission Risk Interventions Readmission Risk Prevention Plan 09/25/2020  Transportation Screening Complete  PCP or Specialist Appt within 3-5 Days Complete  HRI or Home Care Consult Complete  Some recent data might be hidden

## 2021-07-14 ENCOUNTER — Ambulatory Visit: Payer: Medicare Other | Admitting: Thoracic Surgery (Cardiothoracic Vascular Surgery)

## 2021-07-20 ENCOUNTER — Other Ambulatory Visit: Payer: Self-pay | Admitting: *Deleted

## 2021-07-20 ENCOUNTER — Other Ambulatory Visit: Payer: Self-pay | Admitting: Thoracic Surgery (Cardiothoracic Vascular Surgery)

## 2021-07-20 DIAGNOSIS — Z9889 Other specified postprocedural states: Secondary | ICD-10-CM

## 2021-07-20 DIAGNOSIS — J90 Pleural effusion, not elsewhere classified: Secondary | ICD-10-CM

## 2021-07-21 ENCOUNTER — Ambulatory Visit (INDEPENDENT_AMBULATORY_CARE_PROVIDER_SITE_OTHER): Payer: Self-pay | Admitting: Thoracic Surgery (Cardiothoracic Vascular Surgery)

## 2021-07-21 ENCOUNTER — Other Ambulatory Visit: Payer: Self-pay

## 2021-07-21 ENCOUNTER — Ambulatory Visit
Admission: RE | Admit: 2021-07-21 | Discharge: 2021-07-21 | Disposition: A | Discharge status: Home / Self Care | Payer: Medicare Other | Source: Ambulatory Visit | Attending: Thoracic Surgery (Cardiothoracic Vascular Surgery) | Admitting: Thoracic Surgery (Cardiothoracic Vascular Surgery)

## 2021-07-21 ENCOUNTER — Ambulatory Visit: Payer: Self-pay | Admitting: Thoracic Surgery (Cardiothoracic Vascular Surgery)

## 2021-07-21 VITALS — BP 111/70 | HR 90 | Resp 20 | Ht 73.0 in | Wt 182.0 lb

## 2021-07-21 DIAGNOSIS — Z9889 Other specified postprocedural states: Secondary | ICD-10-CM

## 2021-07-21 MED ORDER — OLMESARTAN MEDOXOMIL 5 MG PO TABS: 5.0000 mg | ORAL_TABLET | Freq: Every day | ORAL | 6 refills | Status: DC

## 2021-07-21 MED ORDER — ASPIRIN 81 MG PO CAPS: 81.0000 mg | ORAL_CAPSULE | Freq: Every day | ORAL | 6 refills | Status: AC

## 2021-07-21 NOTE — Progress Notes (Signed)
[address]       [name] [address]             [phone]      HPI: [name] returns for follow-up after his recent decortication  [name] is a 70 year old man with a history of coronary disease, atrial fibrillation, CABG, Maze procedure, left atrial appendage clip, left bundle branch block, hypertension, carotid disease, and recurrent left pleural effusion.  He had CABG in September 2021.  A Maze procedure and left atrial appendage clipping were done at the same time.  He had a postoperative left pleural effusion.  In November he was admitted for cholecystectomy and had a large pleural effusion at that time.  He recently was admitted with 6-week history of progressive cough and shortness of breath and general malaise.  He had near complete white out of his left chest.  Pleural fluid was consistent with an exudate.  He underwent a robotic assisted left VATS for drainage of the pleural effusion and decortication on 07/01/2021.  He was discharged to rehab on 07/08/2021.  He has done well with rehab and has been discharged.  He is returning home today.  He has had some pain but is seldom taking tramadol.  He was told he should not take Tylenol while in the hospital.  Not sure where that came from.  His breathing is significantly improved from the time of admission.  Past Medical History:  Diagnosis Date   Atrial fibrillation (HCC)    Carotid artery stenosis    Coronary artery disease    Dysrhythmia    Hypertension    LBBB (left bundle branch block)     Current Outpatient Medications  Medication Sig Dispense Refill   metoprolol tartrate (LOPRESSOR) 25 MG tablet Take 0.5 tablets (12.5 mg total) by mouth daily. 90 tablet 2   polyethylene glycol (MIRALAX / GLYCOLAX) 17 g packet Take 17 g by mouth daily as needed for mild constipation. 14 each 0   rosuvastatin (CRESTOR) 20 MG tablet Take 1 tablet (20 mg total) by mouth at bedtime. 90 tablet 0   traMADol  (ULTRAM) 50 MG tablet Take 1-2 tablets (50-100 mg total) by mouth every 6 (six) hours as needed (mild pain). 12 tablet 0   traZODone (DESYREL) 100 MG tablet Take 1 tablet (100 mg total) by mouth at bedtime as needed for sleep.     XARELTO 20 MG TABS tablet TAKE 1 TABLET BY MOUTH IN  THE EVENING AFTER DINNER (Patient taking differently: Take 20 mg by mouth daily with supper.) 90 tablet 3   No current facility-administered medications for this visit.    Physical Exam BP 111/70   Pulse 90   Resp 20   Ht 6\' 1"  (1.854 m)   Wt 182 lb (82.6 kg)   SpO2 95% Comment: RA  BMI 24.96 kg/m  70 year old man in no acute distress Alert and oriented x3 with no focal deficits Cardiac regular rate and rhythm Lungs slightly diminished at left base but otherwise clear Incisions healing well No edema  Diagnostic Tests: CHEST - 2 VIEW   COMPARISON:  July 07, 2021.   FINDINGS: The heart size and mediastinal contours are within normal limits. Status post coronary bypass graft. No pneumothorax is noted. Right lung is clear. Stable left pleural effusion is noted with associated atelectasis or scarring. The visualized skeletal structures are unremarkable.   IMPRESSION: Stable small left pleural effusion is noted with associated left basilar  atelectasis or scarring.     Electronically Signed   By: Lupita Raider M.D.   On: 07/21/2021 10:02 I personally reviewed the chest x-ray images.  There is an excellent post decortication result.  Impression: Dalton "[name] Rice" [name] Rice is a 70 year old man with a history of coronary disease, atrial fibrillation, CABG, Maze procedure, left atrial appendage clip, left bundle branch block, hypertension, carotid disease, and recurrent left pleural effusion.  He presented with cough and shortness of breath.  He had a large left pleural effusion with near complete white out of the left chest.  He had a thoracentesis and then a pigtail catheter was placed.  Ultimately  underwent robotic assisted left VATS for decortication on 07/01/2021.  His postoperative course was unremarkable and he was discharged on day 7.  He went to rehab initially but has now been discharged home.  He is doing well from a pain standpoint.  He really has not been using much of anything.  He would like to use Tylenol.  I see no reason that he cannot do so.  He will go ahead and fill his prescription for tramadol just in case he needs that as well.  Pathology showed some inflammatory changes and a lymphocytic infiltrate but no evidence of malignancy.  There are no restrictions on his activities at this point, but he should build into new activities gradually.  Plan: Return in 2 months with PA and lateral chest x-ray  Loreli Slot, MD [facility] [phone]

## 2021-07-29 ENCOUNTER — Other Ambulatory Visit: Payer: Medicare Other

## 2021-08-06 ENCOUNTER — Ambulatory Visit: Payer: Medicare Other | Admitting: Cardiology

## 2021-08-12 ENCOUNTER — Ambulatory Visit: Payer: Medicare Other

## 2021-08-12 ENCOUNTER — Other Ambulatory Visit: Payer: Self-pay

## 2021-08-12 DIAGNOSIS — I6523 Occlusion and stenosis of bilateral carotid arteries: Secondary | ICD-10-CM

## 2021-08-14 ENCOUNTER — Other Ambulatory Visit: Payer: Self-pay | Admitting: Cardiology

## 2021-08-14 DIAGNOSIS — Z951 Presence of aortocoronary bypass graft: Secondary | ICD-10-CM

## 2021-08-14 DIAGNOSIS — E7800 Pure hypercholesterolemia, unspecified: Secondary | ICD-10-CM

## 2021-08-14 DIAGNOSIS — I2510 Atherosclerotic heart disease of native coronary artery without angina pectoris: Secondary | ICD-10-CM

## 2021-08-14 LAB — ACID FAST CULTURE WITH REFLEXED SENSITIVITIES (MYCOBACTERIA): Acid Fast Culture: NEGATIVE

## 2021-08-24 ENCOUNTER — Ambulatory Visit: Payer: Medicare Other | Admitting: Cardiology

## 2021-08-24 NOTE — Progress Notes (Signed)
Called and spoke with patient, he will make sure he brings all his medications to his OV.

## 2021-08-27 ENCOUNTER — Encounter: Payer: Self-pay | Admitting: Cardiology

## 2021-08-27 ENCOUNTER — Other Ambulatory Visit: Payer: Self-pay

## 2021-08-27 ENCOUNTER — Ambulatory Visit: Payer: Medicare Other | Admitting: Cardiology

## 2021-08-27 VITALS — BP 134/69 | HR 83 | Temp 97.7°F | Resp 16 | Ht 73.0 in | Wt 196.6 lb

## 2021-08-27 DIAGNOSIS — E7800 Pure hypercholesterolemia, unspecified: Secondary | ICD-10-CM

## 2021-08-27 DIAGNOSIS — N183 Chronic kidney disease, stage 3 unspecified: Secondary | ICD-10-CM

## 2021-08-27 DIAGNOSIS — I4821 Permanent atrial fibrillation: Secondary | ICD-10-CM

## 2021-08-27 DIAGNOSIS — Z951 Presence of aortocoronary bypass graft: Secondary | ICD-10-CM

## 2021-08-27 DIAGNOSIS — Z8679 Personal history of other diseases of the circulatory system: Secondary | ICD-10-CM

## 2021-08-27 DIAGNOSIS — I447 Left bundle-branch block, unspecified: Secondary | ICD-10-CM

## 2021-08-27 DIAGNOSIS — Z9889 Other specified postprocedural states: Secondary | ICD-10-CM

## 2021-08-27 DIAGNOSIS — I129 Hypertensive chronic kidney disease with stage 1 through stage 4 chronic kidney disease, or unspecified chronic kidney disease: Secondary | ICD-10-CM

## 2021-08-27 DIAGNOSIS — I2510 Atherosclerotic heart disease of native coronary artery without angina pectoris: Secondary | ICD-10-CM

## 2021-08-27 DIAGNOSIS — Z7901 Long term (current) use of anticoagulants: Secondary | ICD-10-CM

## 2021-08-27 DIAGNOSIS — I5022 Chronic systolic (congestive) heart failure: Secondary | ICD-10-CM

## 2021-08-27 DIAGNOSIS — I6523 Occlusion and stenosis of bilateral carotid arteries: Secondary | ICD-10-CM

## 2021-08-27 NOTE — Progress Notes (Signed)
[name] Rice Date of Birth: August 16, 1951 MRN: [id number] Primary Care Provider:Kaplan, Isidor Holts., PA-C Former Cardiology Providers: Dr. Rudean Hitt, APRN, FNP-C Primary Cardiologist: Tessa Lerner, DO, [facility] (established care 01/14/2020)  Date: 08/27/21 Last Office Visit: 04/28/2021   Chief Complaint  Patient presents with   carotid disease   Follow-up   HPI  [name] Rice is a 70 y.o.  male who presents to the office with a chief complaint of " 4 month follow-up carotid artery disease." Patient's past medical history and cardiovascular risk factors include: Multivessel CAD status post four-vessel bypass 07/2020, biatrial Maze procedure, clipping of the left atrial appendage, asymptomatic bilateral carotid artery stenosis, history of nonsustained ventricular tachycardia and ventricular standstill, persistent atrial fibrillation, hypertension, hyperlipidemia, left bundle branch block, obesity due to excess calories, recently hospitalized for acute cholecystitis status post ERCP, MRCP, and partial laparoscopic cholecystectomy, obesity due to excess calories.  In the past workup for near syncope illustrated him having multiple episodes of NSVT and ventricular standstill reaching up to 9 seconds in duration on the monitor. Than underwent LHC and was noted to have multivessel CAD and he underwent four-vessel bypass surgery in September 2021 with Dr. Vickey Sages and did well.  S/P CABG continued to have generalized weakness, nausea / vomiting/diarrhea and was diagnosed with acute cholecystitis status post ERCP, MRCP, and partial laparoscopic cholecystectomy.   During the routine pre-CABG workup he had carotid duplex which noted carotid stenosis and had a follow up study in March 2022 which noted bilateral carotid stenosis (left > right). Recommended CTA head and neck during prior visits but he refused.  He wanted to proceed with regular carotid duplex to evaluate disease progression.    Since  last office visit patient is doing well from a cardiovascular standpoint.  However, was hospitalized for recurrent left-sided pleural effusion and underwent a robotic assisted left VATS for drainage of the pleural effusion and decortication on 07/01/2021.  He was discharged to rehab on 07/08/2021.  He follows up with Dr. Dorris Fetch.  He recently had a carotid duplex performed prior to today's visit results reviewed with him in great detail.  He denies any vision changes or focal neurological deficits.  Patient denies any chest pain or shortness of breath at rest or with effort related activities.  No use of sublingual nitroglycerin tablets.  No hospitalizations or urgent care visits for cardiovascular symptoms.  ALLERGIES: No Known Allergies  MEDICATION LIST PRIOR TO VISIT: Current Outpatient Medications on File Prior to Visit  Medication Sig Dispense Refill   Aspirin 81 MG CAPS Take 81 mg by mouth daily. 30 capsule 6   metoprolol tartrate (LOPRESSOR) 25 MG tablet Take 0.5 tablets (12.5 mg total) by mouth daily. 90 tablet 2   olmesartan (BENICAR) 5 MG tablet Take 1 tablet (5 mg total) by mouth daily. 30 tablet 6   rosuvastatin (CRESTOR) 20 MG tablet TAKE 1 TABLET BY MOUTH EVERYDAY AT BEDTIME 90 tablet 0   XARELTO 20 MG TABS tablet TAKE 1 TABLET BY MOUTH IN  THE EVENING AFTER DINNER (Patient taking differently: Take 20 mg by mouth daily with supper.) 90 tablet 3   No current facility-administered medications on file prior to visit.    PAST MEDICAL HISTORY: Past Medical History:  Diagnosis Date   Atrial fibrillation (HCC)    Carotid artery stenosis    Coronary artery disease    Dysrhythmia    Hypertension    LBBB (left bundle branch block)     PAST SURGICAL  HISTORY: Past Surgical History:  Procedure Laterality Date   CARDIOVERSION N/A 09/28/2016   Procedure: CARDIOVERSION;  Surgeon: Yates Decamp, MD;  Location: [facility] ENDOSCOPY;  Service: Cardiovascular;  Laterality: N/A;   CATARACT  EXTRACTION, BILATERAL     CHEST TUBE INSERTION N/A 06/24/2021   Procedure: CHEST TUBE INSERTION;  Surgeon: Martina Sinner, MD;  Location: [facility] ENDOSCOPY;  Service: Pulmonary;  Laterality: N/A;   CHOLECYSTECTOMY N/A 09/18/2020   Procedure: LAPAROSCOPIC PARTIAL CHOLECYSTECTOMY;  Surgeon: Ovidio Kin, MD;  Location: [facility] ORS;  Service: General;  Laterality: N/A;   CLIPPING OF ATRIAL APPENDAGE Left 07/29/2020   Procedure: CLIPPING OF ATRIAL APPENDAGE;  Surgeon: Linden Dolin, MD;  Location: [facility] OR;  Service: Open Heart Surgery;  Laterality: Left;   CORONARY ARTERY BYPASS GRAFT N/A 07/29/2020   Procedure: CORONARY ARTERY BYPASS GRAFTING (CABG) TIMES FOUR USING BILATERAL INTERNAL MAMMARIES AND LEFT RADIAL ARTERY;  Surgeon: Linden Dolin, MD;  Location: [facility] OR;  Service: Open Heart Surgery;  Laterality: N/A;   ERCP N/A 09/17/2020   Procedure: ENDOSCOPIC RETROGRADE CHOLANGIOPANCREATOGRAPHY (ERCP);  Surgeon: Meryl Dare, MD;  Location: [facility] ENDOSCOPY;  Service: Endoscopy;  Laterality: N/A;   IR THORACENTESIS ASP PLEURAL SPACE W/IMG GUIDE  06/23/2021   LEFT HEART CATH AND CORONARY ANGIOGRAPHY N/A 07/25/2020   Procedure: LEFT HEART CATH AND CORONARY ANGIOGRAPHY;  Surgeon: Yates Decamp, MD;  Location: [facility] INVASIVE CV LAB;  Service: Cardiovascular;  Laterality: N/A;   MAZE N/A 07/29/2020   Procedure: MAZE;  Surgeon: Linden Dolin, MD;  Location: [facility] OR;  Service: Open Heart Surgery;  Laterality: N/A;   RADIAL ARTERY HARVEST Left 07/29/2020   Procedure: RADIAL ARTERY HARVEST;  Surgeon: Linden Dolin, MD;  Location: [facility] OR;  Service: Open Heart Surgery;  Laterality: Left;   REMOVAL OF STONES  09/17/2020   Procedure: REMOVAL OF STONES;  Surgeon: Meryl Dare, MD;  Location: [facility] ENDOSCOPY;  Service: Endoscopy;;   SPHINCTEROTOMY  09/17/2020   Procedure: Dennison Mascot;  Surgeon: Meryl Dare, MD;  Location: [facility] ENDOSCOPY;  Service: Endoscopy;;   TEE WITHOUT CARDIOVERSION N/A 07/29/2020   Procedure:  TRANSESOPHAGEAL ECHOCARDIOGRAM (TEE);  Surgeon: Linden Dolin, MD;  Location: [facility] OR;  Service: Open Heart Surgery;  Laterality: N/A;    FAMILY HISTORY: The patient's family history includes Heart disease in his father and mother; Hyperlipidemia in his mother; Hypertension in an other family member.   SOCIAL HISTORY:  The patient  reports that he has never smoked. He has never used smokeless tobacco. He reports that he does not drink alcohol and does not use drugs.  Review of Systems  Constitutional: Negative for chills and fever.  HENT:  Negative for hoarse voice and nosebleeds.   Eyes:  Negative for discharge, double vision and pain.  Cardiovascular:  Negative for chest pain, claudication, dyspnea on exertion, leg swelling, near-syncope, orthopnea, palpitations, paroxysmal nocturnal dyspnea and syncope.  Respiratory:  Negative for cough, hemoptysis and shortness of breath.   Musculoskeletal:  Negative for muscle cramps and myalgias.  Gastrointestinal:  Negative for abdominal pain, constipation, diarrhea, hematemesis, hematochezia, melena, nausea and vomiting.  Neurological:  Negative for dizziness and light-headedness.    PHYSICAL EXAM: Vitals with BMI 08/27/2021 07/21/2021 07/08/2021  Height 6\' 1"  6\' 1"  -  Weight 196 lbs 10 oz 182 lbs -  BMI 25.94 24.02 -  Systolic 134 111  Diastolic 69 70 69  Pulse 83 90 -   CONSTITUTIONAL: Appears older than stated age, hemodynamically stable, no acute  distress.   SKIN: Skin is warm and dry. No rash noted. No cyanosis. No pallor. No jaundice HEAD: Normocephalic and atraumatic.  EYES: No scleral icterus MOUTH/THROAT: Moist oral membranes.  NECK: No JVD present. No thyromegaly noted.  Mild bilateral carotid bruits  LYMPHATIC: No visible cervical adenopathy.  CHEST Normal respiratory effort. No intercostal retractions.  Sternotomy site is healing well. LUNGS: Clear to auscultation in the upper lung fields with decreased breath sounds at the  bases, No stridor. No wheezes. No rales.  CARDIOVASCULAR: Irregularly irregular, positive S1-S2, no murmurs rubs or gallops appreciated. ABDOMINAL: No apparent ascites.  EXTREMITIES: trace peripheral edema.  Left radial site is healing well. HEMATOLOGIC: No significant bruising. NEUROLOGIC: Oriented to person, place, and time. Nonfocal. Normal muscle tone.  PSYCHIATRIC: Normal mood and affect. Normal behavior. Cooperative   RADIOLOGY: 08/11/2020 CXR:  Persistent left pleural effusion with left base atelectasis. Scattered calcified granulomas noted. Lungs elsewhere clear. Stable cardiac enlargement with postoperative changes.  Aortic Atherosclerosis  CARDIAC DATABASE: EKG: 04/28/2021: Atrial fibrillation, 56 bpm, left bundle branch block  Coronary artery bypass grafting: 07/29/2020 (by Dr. Vickey Sages at [facility]):  Left Internal Mammary Artery to Distal Left Anterior Descending Coronary Artery; pedicled RIMA Graft to Posterior Descending Coronary Artery; left radial artery  Graft to Obtuse Marginal Branch of Left Circumflex Coronary Artery and ramus intermedius as a sequenced graft. Bi-atrial Maze procedure and left atrial appendage clipping  Echocardiogram: 12/31/2019: Mildly depressed LV systolic function with visual EF 45-50%. Left ventricle cavity is normal in size. Left ventricle regional wall motion findings: Mid anteroseptal, Mid inferoseptal, Apical anterior, Apical septal and Apical cap hypokinesis. Mild left ventricular hypertrophy.  Unable to evaluate diastolic function due to atrial fibrillation.  Left atrial cavity is severely dilated. Right atrial cavity is slightly dilated. Right ventricle cavity is normal in size. Low normal right ventricular function. Mild aortic valve leaflet thickening with mild calcification. Mildly restricted aortic valve leaflets. No evidence of aortic valve stenosis. No aortic valve regurgitation. Mild tricuspid regurgitation. Mild pulmonary  hypertension. RVSP measures 35 mmHg. IVC is dilated with a respiratory response of >50%.   09/27/2020: 1. Left ventricular ejection fraction, by estimation, is 45 to 50%,  although evaluation limited due to poor visualization of the endocardium.  The left ventricle has mildly decreased function. The left ventricle  demonstrates regional wall motion  abnormalities (see scoring diagram/findings for description). Left  ventricular diastolic parameters are indeterminate.   2. Right ventricular systolic function is normal. The right ventricular  size is normal.   3. Left atrial size was mildly dilated.   4. Right atrial size was mildly dilated.   5. Normal right atrial pressure.   6. Compared to previous study on 07/24/2020, post-op changes are new.   Stress Testing:  02/18/2020: No previous exam available for comparison. Lexiscan nuclear stress test performed using 1-day protocol. Stress EKG is non-diagnostic, as this is pharmacological stress test. In addition, rest and stress EKG showed atrial fibrillation with slow ventricular response, anterolateral T wave inversion. Stress LVEF 77%. Normal myocardial perfusion. Low risk study.  Heart Catheterization: 07/25/20:  RCA: Proximal RCA 80% stenosis, large vessel with mild disease in PL and PDA branches.  A secondary PL branch is subtotally occluded. LM: Distal 30-40% stenosis. LAD: LAD diffusely diseased, proximal diffuse 80% followed by tandem 90% stenosis.  Large D1 with moderate diffuse disease and proximal and mid tandem 70 and 80% stenosis.  Has secondary branches and is tortuous. Cx  and RI: Co-dominant Cx  with Ostial circumflex 99% involving a moderate sized ramus with ostial 80 to 90% and proximal 90% stenosis. LV: Normal LVEDP.  No pressure gradient across the aortic valve. Patent LIMA and RIMA and RIMA has ostial 20-30% stenosis. Subclavian arteries widely patent.  Right radial diffusely disease by [place]. 60mL contrast used.    Carotid duplex: 07/27/2020:  Right Carotid: Velocities in the right ICA are consistent with a 1-39% stenosis.  Left Carotid: Velocities in the left ICA are consistent with a 60-79% stenosis.  Vertebrals: Bilateral vertebral arteries demonstrate antegrade flow.   08/12/2021: Duplex suggests stenosis in the right internal carotid artery (>=70%).  Duplex suggests stenosis in the right external carotid artery (<50%). Duplex suggests stenosis in the left internal carotid artery (>=70%).  Duplex suggests stenosis in the left external carotid artery (<50%). The PSV internal/common carotid artery ratio is 6.49 on the right and 4.52 on the left consistent with a stenosis of >70% bilaterally. Antegrade left vertebral artery flow. No significant change compared to 01/13/2021. Follow up in six months is appropriate if clinically indicated.  14 day extended Holter monitor: Dominant rhythm atrial fibrillation (HR between 25-137bpm, avg. 59bpm).  Overall HR 25-197 bpm.  Avg HR 59 bpm. 1519 pauses that were 3 secs or longer.  The longest pause 9.5 sec at 12:36 AM (07/04/2020), followed by 9.4 sec pause at 4:30 AM (07/11/2020), third longest pause 8.9 sec (07/08/2020).  These are auto-triggered events. 20 episodes of NSVT reported.  Longest episode 20 beats in duration for 10.5 seconds at an average rate 116 bpm.  The fastest episode was 6 beats in duration at average rate of 170 bpm.  Total ventricular ectopic burden <1%. Patient activated events: 0.  LABORATORY DATA: CBC Latest Ref Rng & Units 07/05/2021 07/04/2021 07/03/2021  WBC 4.0 - 10.5 K/uL 6.9 6.6 8.8  Hemoglobin 13.0 - 17.0 g/dL 0.1(X) 7.9(T) 8.9(L)  Hematocrit 39.0 - 52.0 % 28.5(L) 28.0(L) 29.0(L)  Platelets 150 - 400 K/uL 400 366 340    CMP Latest Ref Rng & Units 07/05/2021 07/04/2021 07/03/2021  Glucose 70 - 99 mg/dL 90 87 90  BUN 8 - 23 mg/dL 6(L) 9 8  Creatinine 9.03 - 1.24 mg/dL 0.09 2.33 0.07  Sodium 135 - 145 mmol/L 132(L) 131(L) 131(L)   Potassium 3.5 - 5.1 mmol/L 3.5 3.6 3.7  Chloride 98 - 111 mmol/L 97(L) 98 99  CO2 22 - 32 mmol/L 27 28 27   Calcium 8.9 - 10.3 mg/dL 8.0(L) 7.9(L) 7.9(L)  Total Protein 6.5 - 8.1 g/dL - - 5.4(L)  Total Bilirubin 0.3 - 1.2 mg/dL - - 0.9  Alkaline Phos 38 - 126 U/L - - 73  AST 15 - 41 U/L - - 12(L)  ALT 0 - 44 U/L - - 8    Lipid Panel     Component Value Date/Time   CHOL 132 01/20/2021 0942   TRIG 93 01/20/2021 0942   HDL 40 01/20/2021 0942   CHOLHDL 3.3 07/26/2020 0515   VLDL 18 07/26/2020 0515   LDLCALC 74 01/20/2021 0942   LDLDIRECT 71 01/20/2021 0943   LABVLDL 18 01/20/2021 0942    Lab Results  Component Value Date   HGBA1C 5.2 07/26/2020   No components found for: NTPROBNP Lab Results  Component Value Date   TSH 1.610 11/13/2020   TSH 1.083 09/16/2020   TSH 0.941 07/24/2020    Cardiac Panel (last 3 results) No results for input(s): CKTOTAL, CKMB, TROPONINIHS, RELINDX in the last 72 hours.  IMPRESSION:  ICD-10-CM   1. Bilateral carotid artery stenosis  I65.23 Basic metabolic panel    Lipid Panel With LDL/HDL Ratio    LDL cholesterol, direct    2. Pure hypercholesterolemia  E78.00     3. Chronic HFrEF (heart failure with reduced ejection fraction) (HCC)  I50.22     4. Atherosclerosis of native coronary artery of native heart without angina pectoris  I25.10     5. Hx of CABG  Z95.1     6. Permanent atrial fibrillation (HCC)  I48.21     7. S/P left atrial appendage ligation  Z98.890     8. Long term current use of anticoagulant  Z79.01     9. S/P Maze operation for atrial fibrillation  Z98.890    Z86.79     10. Left bundle branch block  I44.7     11. Benign hypertension with CKD (chronic kidney disease) stage III (HCC)  I12.9    N18.30         RECOMMENDATIONS: [name] Rice is a 70 y.o. male whose past medical history and cardiovascular risk factors include: Multivessel CAD status post four-vessel bypass 07/2020, biatrial Maze procedure,  clipping of the left atrial appendage, asymptomatic bilateral carotid artery stenosis, history of nonsustained ventricular tachycardia and ventricular standstill, persistent atrial fibrillation, hypertension, hyperlipidemia, left bundle branch block, obesity due to excess calories, recently hospitalized for acute cholecystitis status post ERCP, MRCP, and partial laparoscopic cholecystectomy, obesity due to excess calories.  Bilateral carotid artery stenosis: Reviewed the results of the carotid duplex with the patient at today's office visit.   He continues to have bilateral carotid artery stenosis greater than 70% per duplex criteria. Patient is willing to proceed with CTA head and neck to further evaluate the severity of bilateral ICA stenosis. Continue current dose of rosuvastatin. Will check BMP and fasting lipid profile Further recommendations to follow.  Chronic HFrEF, stage C, NYHA class II:  Titration of GDMT has been difficult given his history of hypotension. Medications reconciled. Euvolemic on physical examination. No hospitalizations for congestive heart failure since last office visit.   Monitor for now.  Multivessel CAD s/p four-vessel bypass without angina pectoris: Medications reconciled. No recent chest pain or anginal equivalent.   Continue aspirin and statin therapy. Will uptitrate guideline directed medical therapy slowly given his hemodynamics.  Permanent atrial fibrillation, status post biatrial maze procedure: Rate control: Beta-blocker therapy. Rhythm control: NA (patient was on amiodarone in the past). Thromboembolic prophylaxis: Xarelto. Status post left atrial appendage clipping.  History of failed cardioversion in the past. Continue current medical management.  Long-term oral anticoagulation:  Indication: Atrial fibrillation. CHA2DS2-VASc SCORE is 4 which correlates to 4.0 % risk of stroke per year (LVEF45-50%, HTN, Vascular disease, Age). Reemphasized the  risks, benefits, and alternatives to oral anticoagulation.  Benign essential hypertension: Improving. Office blood pressure is improving. Continue current medical therapy. Educated on the importance of a low-salt diet.  FINAL MEDICATION LIST END OF ENCOUNTER: No orders of the defined types were placed in this encounter.    Current Outpatient Medications:    Aspirin 81 MG CAPS, Take 81 mg by mouth daily., Disp: 30 capsule, Rfl: 6   metoprolol tartrate (LOPRESSOR) 25 MG tablet, Take 0.5 tablets (12.5 mg total) by mouth daily., Disp: 90 tablet, Rfl: 2   olmesartan (BENICAR) 5 MG tablet, Take 1 tablet (5 mg total) by mouth daily., Disp: 30 tablet, Rfl: 6   rosuvastatin (CRESTOR) 20 MG tablet, TAKE 1 TABLET BY MOUTH EVERYDAY AT  BEDTIME, Disp: 90 tablet, Rfl: 0   XARELTO 20 MG TABS tablet, TAKE 1 TABLET BY MOUTH IN  THE EVENING AFTER DINNER (Patient taking differently: Take 20 mg by mouth daily with supper.), Disp: 90 tablet, Rfl: 3  Orders Placed This Encounter  Procedures   Basic metabolic panel   Lipid Panel With LDL/HDL Ratio   LDL cholesterol, direct    --Continue cardiac medications as reconciled in final medication list. --Return in about 4 weeks (around 09/24/2021) for Follow up carotid disease, s/p CTA head and neck. . Or sooner if needed. --Continue follow-up with your primary care physician regarding the management of your other chronic comorbid conditions.  Patient's questions and concerns were addressed to his satisfaction. He voices understanding of the instructions provided during this encounter.   This note was created using a voice recognition software as a result there may be grammatical errors inadvertently enclosed that do not reflect the nature of this encounter. Every attempt is made to correct such errors.   Tessa Lerner, [place], [facility]  Pager: [phone] Office: [phone]

## 2021-09-15 ENCOUNTER — Other Ambulatory Visit: Payer: Self-pay | Admitting: Cardiology

## 2021-09-15 ENCOUNTER — Ambulatory Visit
Admission: RE | Admit: 2021-09-15 | Discharge: 2021-09-15 | Disposition: A | Discharge status: Home / Self Care | Payer: Medicare Other | Source: Ambulatory Visit | Attending: Cardiology | Admitting: Cardiology

## 2021-09-15 DIAGNOSIS — I6523 Occlusion and stenosis of bilateral carotid arteries: Secondary | ICD-10-CM

## 2021-09-15 DIAGNOSIS — I119 Hypertensive heart disease without heart failure: Secondary | ICD-10-CM

## 2021-09-15 MED ORDER — IOPAMIDOL (ISOVUE-370) INJECTION 76%
75.0000 mL | Freq: Once | INTRAVENOUS | Status: AC | PRN
  Administered 2021-09-15: 75 mL via INTRAVENOUS

## 2021-09-21 ENCOUNTER — Other Ambulatory Visit: Payer: Self-pay

## 2021-09-21 DIAGNOSIS — Z951 Presence of aortocoronary bypass graft: Secondary | ICD-10-CM

## 2021-09-21 DIAGNOSIS — E7800 Pure hypercholesterolemia, unspecified: Secondary | ICD-10-CM

## 2021-09-21 DIAGNOSIS — I2510 Atherosclerotic heart disease of native coronary artery without angina pectoris: Secondary | ICD-10-CM

## 2021-09-21 MED ORDER — ROSUVASTATIN CALCIUM 20 MG PO TABS: ORAL_TABLET | ORAL | 2 refills | Status: DC

## 2021-09-22 ENCOUNTER — Ambulatory Visit: Payer: Medicare Other | Admitting: Thoracic Surgery (Cardiothoracic Vascular Surgery)

## 2021-09-23 ENCOUNTER — Other Ambulatory Visit: Payer: Self-pay | Admitting: Cardiology

## 2021-09-23 DIAGNOSIS — I6523 Occlusion and stenosis of bilateral carotid arteries: Secondary | ICD-10-CM

## 2021-09-23 NOTE — Progress Notes (Signed)
I called patient he would like a call from you or at least an explanation as to why he needs to be seen sooner please advise

## 2021-09-23 NOTE — Progress Notes (Signed)
Reviewed the results of the CT Head and Neck with the patient over the phone.   Will refer him to Vascular Surgery for Carotid Artery Stenosis.   Tessa Lerner, [place], [facility]  Pager: [phone] Office: [phone]

## 2021-09-29 ENCOUNTER — Ambulatory Visit (INDEPENDENT_AMBULATORY_CARE_PROVIDER_SITE_OTHER): Payer: Self-pay | Admitting: Thoracic Surgery (Cardiothoracic Vascular Surgery)

## 2021-09-29 ENCOUNTER — Other Ambulatory Visit: Payer: Self-pay

## 2021-09-29 ENCOUNTER — Ambulatory Visit
Admission: RE | Admit: 2021-09-29 | Discharge: 2021-09-29 | Disposition: A | Discharge status: Home / Self Care | Payer: Medicare Other | Source: Ambulatory Visit | Attending: Thoracic Surgery (Cardiothoracic Vascular Surgery) | Admitting: Thoracic Surgery (Cardiothoracic Vascular Surgery)

## 2021-09-29 VITALS — BP 135/82 | HR 77 | Resp 20 | Ht 73.0 in | Wt 205.0 lb

## 2021-09-29 DIAGNOSIS — J90 Pleural effusion, not elsewhere classified: Secondary | ICD-10-CM

## 2021-09-29 DIAGNOSIS — Z9889 Other specified postprocedural states: Secondary | ICD-10-CM

## 2021-09-29 NOTE — Progress Notes (Signed)
[address]       [name] [address]             [phone]     HPI: [name] Rice returns for follow-up of his left pleural effusion.  [name] Rice is a 70 year old man with a history of CAD, A. fib, CABG, Maze procedure, left atrial appendage clip, left bundle branch block, hypertension, carotid disease, and recurrent left pleural effusion.  He had robotic left VATS for decortication on 07/01/2021.  He now is feeling well.  He is getting his strength back.  He is not having any problems with shortness of breath orthopnea.  He does not have any pain from the surgery.  Past Medical History:  Diagnosis Date   Atrial fibrillation (HCC)    Carotid artery stenosis    Coronary artery disease    Dysrhythmia    Hypertension    LBBB (left bundle branch block)     Current Outpatient Medications  Medication Sig Dispense Refill   Aspirin 81 MG CAPS Take 81 mg by mouth daily. 30 capsule 6   olmesartan (BENICAR) 5 MG tablet Take 1 tablet (5 mg total) by mouth daily. 30 tablet 6   rosuvastatin (CRESTOR) 20 MG tablet TAKE 1 TABLET BY MOUTH EVERYDAY AT BEDTIME 90 tablet 2   XARELTO 20 MG TABS tablet TAKE 1 TABLET BY MOUTH IN  THE EVENING AFTER DINNER (Patient taking differently: Take 20 mg by mouth daily with supper.) 90 tablet 3   metoprolol tartrate (LOPRESSOR) 25 MG tablet Take 0.5 tablets (12.5 mg total) by mouth daily. 90 tablet 2   No current facility-administered medications for this visit.    Physical Exam BP 135/82 (BP Location: Right Arm, Patient Position: Sitting)   Pulse 77   Resp 20   Ht 6\' 1"  (1.854 m)   Wt 205 lb (93 kg)   SpO2 99% Comment: RA  BMI 27.35 kg/m  70 year old man in no acute distress Alert and oriented x3 with no focal deficits Lungs diminished at left base but otherwise clear  Diagnostic Tests: CHEST - 2 VIEW   COMPARISON:  07/21/2021   FINDINGS: Normal heart size post LEFT atrial appendage clipping.   Mediastinal  contours and pulmonary vascularity normal.   Atherosclerotic calcification aorta.   Persistent small LEFT pleural effusion and basilar atelectasis.   Remaining lungs clear.   No acute infiltrate or pneumothorax.   Bones demineralized.   IMPRESSION: Persistent small LEFT pleural effusion and basilar atelectasis.   Aortic Atherosclerosis (ICD10-I70.0).     Electronically Signed   By: 07/23/2021 M.D.   On: 09/29/2021 12:34 I personally reviewed the chest x-ray images.  There is some persistent pleural-parenchymal scarring at the left base.  May be a small amount of residual fluid.  Overall good result post decortication.  Impression: [name] Rice is a 70 year old man with a history of CAD, A. fib, CABG, Maze procedure, left atrial appendage clip, left bundle branch block, hypertension, carotid disease, and recurrent left pleural effusion.  He had robotic left VATS for decortication on 07/01/2021.  He is now about 3 months out from surgery for decortication for a chronic exudative pleural effusion.  Pathology showed no evidence of malignancy.  He has had a good clinical and radiographic result.  Plan: I will be happy to see [name] back in the future if I can be of any further assistance with his care  Andrey Campanile, MD [facility] [phone]

## 2021-09-30 ENCOUNTER — Other Ambulatory Visit: Payer: Self-pay | Admitting: Cardiology

## 2021-09-30 ENCOUNTER — Ambulatory Visit: Payer: Medicare Other | Admitting: Cardiology

## 2021-09-30 DIAGNOSIS — I119 Hypertensive heart disease without heart failure: Secondary | ICD-10-CM

## 2021-10-05 ENCOUNTER — Telehealth: Payer: Self-pay | Admitting: Cardiology

## 2021-10-05 NOTE — Telephone Encounter (Signed)
Pt req ref   XARELTO 20 MG TABS tablet  [facility]) [place], [place] - [address]  [phone]

## 2021-10-05 NOTE — Telephone Encounter (Signed)
DONE

## 2021-10-12 NOTE — Progress Notes (Signed)
[facility] [place]  ASSESSMENT / PLAN: [name] Rice is a 70 y.o. male with asymptomatic bilateral 80 - 79 % carotid artery stenosis.   The patient should continue best medical therapy for carotid artery stenosis including: Complete cessation from all tobacco products. Blood glucose control with goal A1c < 7%. Blood pressure control with goal blood pressure < 140/90 mmHg. Lipid reduction therapy with goal LDL-C <100 mg/dL (<70 if symptomatic from carotid artery stenosis).  Aspirin 81mg  PO QD.  Atorvastatin 40-80mg  PO QD (or other "high intensity" statin therapy).  He is near the threshold for asymptomatic intervention, but strongly desires against any kind of surgical therapy at the moment.  I agree with him.  We will plan for watchful waiting.  Follow-up with me in 6 months with repeat carotid duplex.  CHIEF COMPLAINT: Asymptomatic carotid artery stenosis  HISTORY OF PRESENT ILLNESS: [name] Rice is a 70 y.o. male referred to clinic for evaluation of bilateral asymptomatic carotid artery stenosis.  The patient has had a difficult 15 months: He underwent biatrial maze and four-vessel CABG 07/29/2020 with Dr. Julien Girt.  This was followed by laparoscopic subtotal cholecystectomy 09/18/2020 for gangrenous cholecystitis.  Then underwent a robotic VATS decortication of a loculated left pleural effusion with trapped lung with Dr. Roxan Hockey on 07/01/2021.  He is recovering now, and is starting to feel better.  He has no symptoms referable to his carotid arteries.  He has never had a stroke or TIA.  He has no focal symptoms like amaurosis, facial droop, difficulty speaking, difficulty swallowing, unilateral weakness or numbness.  Past Medical History:  Diagnosis Date   Atrial fibrillation ([place])    Carotid artery stenosis    Coronary artery disease    Dysrhythmia    Hypertension    LBBB (left bundle branch block)     Past Surgical History:  Procedure Laterality Date    CARDIOVERSION N/A 09/28/2016   Procedure: CARDIOVERSION;  Surgeon: Adrian Prows, MD;  Location: [place];  Service: Cardiovascular;  Laterality: N/A;   CATARACT EXTRACTION, BILATERAL     CHEST TUBE INSERTION N/A 06/24/2021   Procedure: CHEST TUBE INSERTION;  Surgeon: Freddi Starr, MD;  Location: [facility] ENDOSCOPY;  Service: Pulmonary;  Laterality: N/A;   CHOLECYSTECTOMY N/A 09/18/2020   Procedure: LAPAROSCOPIC PARTIAL CHOLECYSTECTOMY;  Surgeon: Alphonsa Overall, MD;  Location: [facility] ORS;  Service: General;  Laterality: N/A;   CLIPPING OF ATRIAL APPENDAGE Left 07/29/2020   Procedure: CLIPPING OF ATRIAL APPENDAGE;  Surgeon: Wonda Olds, MD;  Location: [place];  Service: Open Heart Surgery;  Laterality: Left;   CORONARY ARTERY BYPASS GRAFT N/A 07/29/2020   Procedure: CORONARY ARTERY BYPASS GRAFTING (CABG) TIMES FOUR USING BILATERAL INTERNAL MAMMARIES AND LEFT RADIAL ARTERY;  Surgeon: Wonda Olds, MD;  Location: [place];  Service: Open Heart Surgery;  Laterality: N/A;   ERCP N/A 09/17/2020   Procedure: ENDOSCOPIC RETROGRADE CHOLANGIOPANCREATOGRAPHY (ERCP);  Surgeon: Ladene Artist, MD;  Location: [facility] END[facility];  Service: Endoscopy;  Laterality: N/A;   IR THORACENTESIS ASP PLEURAL SPACE W/IMG GUIDE  06/23/2021   LEFT HEART CATH AND CORONARY ANGIOGRAPHY N/A 07/25/2020   Procedure: LEFT HEART CATH AND CORONARY ANGIOGRAPHY;  Surgeon: Adrian Prows, MD;  Location: [place] CV LAB;  Service: Cardiovascular;  Laterality: N/A;   MAZE N/A 07/29/2020   Procedure: MAZE;  Surgeon: Wonda Olds, MD;  Location: [place];  Service: Open Heart Surgery;  Laterality: N/A;   RADIAL ARTERY HARVEST Left 07/29/2020   Procedure:  RADIAL ARTERY HARVEST;  Surgeon: Wonda Olds, MD;  Location: [place];  Service: Open Heart Surgery;  Laterality: Left;   REMOVAL OF STONES  09/17/2020   Procedure: REMOVAL OF STONES;  Surgeon: Ladene Artist, MD;  Location: [facility] ENDOSCOPY;  Service: Endoscopy;;   SPHINCTEROTOMY  09/17/2020    Procedure: Joan Mayans;  Surgeon: Ladene Artist, MD;  Location: [facility] ENDOSCOPY;  Service: Endoscopy;;   TEE WITHOUT CARDIOVERSION N/A 07/29/2020   Procedure: TRANSESOPHAGEAL ECHOCARDIOGRAM (TEE);  Surgeon: Wonda Olds, MD;  Location: [place];  Service: Open Heart Surgery;  Laterality: N/A;    Family History  Problem Relation Age of Onset   Heart disease Mother    Hyperlipidemia Mother    Heart disease Father    Hypertension Other     Social History   Socioeconomic History   Marital status: Single    Spouse name: Not on file   Number of children: 0   Years of education: Not on file   Highest education level: Not on file  Occupational History   Occupation: retired  Tobacco Use   Smoking status: Never   Smokeless tobacco: Never  Vaping Use   Vaping Use: Never used  Substance and Sexual Activity   Alcohol use: No   Drug use: No   Sexual activity: Not on file  Other Topics Concern   Not on file  Social History Narrative   Not on file   Social Determinants of Health   Financial Resource Strain: Not on file  Food Insecurity: Not on file  Transportation Needs: Not on file  Physical Activity: Not on file  Stress: Not on file  Social Connections: Not on file  Intimate Partner Violence: Not on file    No Known Allergies  Current Outpatient Medications  Medication Sig Dispense Refill   Aspirin 81 MG CAPS Take 81 mg by mouth daily. 30 capsule 6   olmesartan (BENICAR) 5 MG tablet Take 1 tablet (5 mg total) by mouth daily. 30 tablet 6   rosuvastatin (CRESTOR) 20 MG tablet TAKE 1 TABLET BY MOUTH EVERYDAY AT BEDTIME 90 tablet 2   XARELTO 20 MG TABS tablet TAKE 1 TABLET BY MOUTH IN  THE EVENING AFTER DINNER 90 tablet 3   metoprolol tartrate (LOPRESSOR) 25 MG tablet Take 0.5 tablets (12.5 mg total) by mouth daily. 90 tablet 2   No current facility-administered medications for this visit.    REVIEW OF SYSTEMS:  [X]  denotes positive finding, [ ]  denotes negative  finding Cardiac  Comments:  Chest pain or chest pressure:    Shortness of breath upon exertion:    Short of breath when lying flat:    Irregular heart rhythm:        Vascular    Pain in calf, thigh, or hip brought on by ambulation:    Pain in feet at night that wakes you up from your sleep:     Blood clot in your veins:    Leg swelling:         Pulmonary    Oxygen at home:    Productive cough:     Wheezing:         Neurologic    Sudden weakness in arms or legs:     Sudden numbness in arms or legs:     Sudden onset of difficulty speaking or slurred speech:    Temporary loss of vision in one eye:     Problems with dizziness:  Gastrointestinal    Blood in stool:     Vomited blood:         Genitourinary    Burning when urinating:     Blood in urine:        Psychiatric    Major depression:         Hematologic    Bleeding problems:    Problems with blood clotting too easily:        Skin    Rashes or ulcers:        Constitutional    Fever or chills:      PHYSICAL EXAM Vitals:   10/13/21 1020  BP: 130/63  Pulse: 71  Resp: 20  Temp: 98.7 F (37.1 C)  SpO2: 99%  Weight: 210 lb (95.3 kg)  Height: 6\' 1"  (1.854 m)    Constitutional: Chronically ill appearing. no distress. Appears marginally nourished.  Neurologic: CN intact. no focal findings. no sensory loss. Psychiatric:  Mood and affect symmetric and appropriate. Eyes:  No icterus. No conjunctival pallor. Ears, nose, throat:  mucous membranes moist. Midline trachea.  Cardiac: Regular rate and rhythm.  Respiratory:  unlabored. Abdominal:  soft, non-tender, non-distended.  Extremity: no edema. no cyanosis. no pallor.  Skin: no gangrene. no ulceration.  Lymphatic: no Stemmer's sign. no palpable lymphadenopathy.  PERTINENT LABORATORY AND RADIOLOGIC DATA  Most recent CBC CBC Latest Ref Rng & Units 07/05/2021 07/04/2021 07/03/2021  WBC 4.0 - 10.5 K/uL 6.9 6.6 8.8  Hemoglobin 13.0 - 17.0 g/dL 07/05/2021)  4.0(G) 8.9(L)  Hematocrit 39.0 - 52.0 % 28.5(L) 28.0(L) 29.0(L)  Platelets 150 - 400 K/uL 400 366 340     Most recent CMP CMP Latest Ref Rng & Units 07/05/2021 07/04/2021 07/03/2021  Glucose 70 - 99 mg/dL 90 87 90  BUN 8 - 23 mg/dL 6(L) 9 8  Creatinine 07/05/2021 - 1.24 mg/dL 6.19 5.09 3.26  Sodium 135 - 145 mmol/L 132(L) 131(L) 131(L)  Potassium 3.5 - 5.1 mmol/L 3.5 3.6 3.7  Chloride 98 - 111 mmol/L 97(L) 98 99  CO2 22 - 32 mmol/L 27 28 27   Calcium 8.9 - 10.3 mg/dL 8.0(L) 7.9(L) 7.9(L)  Total Protein 6.5 - 8.1 g/dL - - 5.4(L)  Total Bilirubin 0.3 - 1.2 mg/dL - - 0.9  Alkaline Phos 38 - 126 U/L - - 73  AST 15 - 41 U/L - - 12(L)  ALT 0 - 44 U/L - - 8    Renal function CrCl cannot be calculated (Patient's most recent lab result is older than the maximum 21 days allowed.).  Hgb A1c MFr Bld (%)  Date Value  07/26/2020 5.2    LDL Chol Calc (NIH)  Date Value Ref Range Status  01/20/2021 74 0 - 99 mg/dL Final   LDL Direct  Date Value Ref Range Status  01/20/2021 71 0 - 99 mg/dL Final     CT angiogram of head and neck personally reviewed and bilateral 70% carotid artery stenosis proximal ICAs bilaterally.  01/22/2021. 01/22/2021, MD [facility] Phone Number: [phone] 10/13/2021 2:37 PM  Total time spent on preparing this encounter including chart review, data review, collecting history, examining the patient, coordinating care for this new patient, 60 minutes.  Portions of this report may have been transcribed using voice recognition software.  Every effort has been made to ensure accuracy; however, inadvertent computerized transcription errors may still be present.

## 2021-10-13 ENCOUNTER — Other Ambulatory Visit: Payer: Self-pay

## 2021-10-13 ENCOUNTER — Ambulatory Visit: Payer: Medicare Other | Admitting: Vascular Surgery

## 2021-10-13 ENCOUNTER — Encounter: Payer: Self-pay | Admitting: Vascular Surgery

## 2021-10-13 VITALS — BP 130/63 | HR 71 | Temp 98.7°F | Resp 20 | Ht 73.0 in | Wt 210.0 lb

## 2021-10-13 DIAGNOSIS — I6523 Occlusion and stenosis of bilateral carotid arteries: Secondary | ICD-10-CM

## 2021-10-14 ENCOUNTER — Other Ambulatory Visit: Payer: Self-pay | Admitting: *Deleted

## 2021-10-14 DIAGNOSIS — I6523 Occlusion and stenosis of bilateral carotid arteries: Secondary | ICD-10-CM

## 2022-01-04 ENCOUNTER — Encounter (HOSPITAL_BASED_OUTPATIENT_CLINIC_OR_DEPARTMENT_OTHER): Payer: Self-pay | Admitting: Emergency Medicine

## 2022-01-04 ENCOUNTER — Other Ambulatory Visit: Payer: Self-pay

## 2022-01-04 ENCOUNTER — Emergency Department (HOSPITAL_BASED_OUTPATIENT_CLINIC_OR_DEPARTMENT_OTHER): Payer: Medicare Other

## 2022-01-04 ENCOUNTER — Emergency Department (HOSPITAL_BASED_OUTPATIENT_CLINIC_OR_DEPARTMENT_OTHER)
Admission: EM | Admit: 2022-01-04 | Discharge: 2022-01-04 | Disposition: A | Discharge status: Home / Self Care | Payer: Medicare Other | Attending: Emergency Medicine | Admitting: Emergency Medicine

## 2022-01-04 DIAGNOSIS — Z7982 Long term (current) use of aspirin: Secondary | ICD-10-CM | POA: Diagnosis not present

## 2022-01-04 DIAGNOSIS — Z79899 Other long term (current) drug therapy: Secondary | ICD-10-CM | POA: Diagnosis not present

## 2022-01-04 DIAGNOSIS — J40 Bronchitis, not specified as acute or chronic: Secondary | ICD-10-CM | POA: Insufficient documentation

## 2022-01-04 DIAGNOSIS — R0602 Shortness of breath: Secondary | ICD-10-CM | POA: Diagnosis present

## 2022-01-04 DIAGNOSIS — Z20822 Contact with and (suspected) exposure to covid-19: Secondary | ICD-10-CM | POA: Diagnosis not present

## 2022-01-04 LAB — CBC WITH DIFFERENTIAL/PLATELET
Abs Immature Granulocytes: 0.08 10*3/uL — ABNORMAL HIGH (ref 0.00–0.07)
Basophils Absolute: 0 10*3/uL (ref 0.0–0.1)
Basophils Relative: 1 %
Eosinophils Absolute: 0.1 10*3/uL (ref 0.0–0.5)
Eosinophils Relative: 2 %
HCT: 43 % (ref 39.0–52.0)
Hemoglobin: 13 g/dL (ref 13.0–17.0)
Immature Granulocytes: 1 %
Lymphocytes Relative: 8 %
Lymphs Abs: 0.7 10*3/uL (ref 0.7–4.0)
MCH: 24.3 pg — ABNORMAL LOW (ref 26.0–34.0)
MCHC: 30.2 g/dL (ref 30.0–36.0)
MCV: 80.5 fL (ref 80.0–100.0)
Monocytes Absolute: 1.1 10*3/uL — ABNORMAL HIGH (ref 0.1–1.0)
Monocytes Relative: 13 %
Neutro Abs: 6.2 10*3/uL (ref 1.7–7.7)
Neutrophils Relative %: 75 %
Platelets: 278 10*3/uL (ref 150–400)
RBC: 5.34 MIL/uL (ref 4.22–5.81)
RDW: 18 % — ABNORMAL HIGH (ref 11.5–15.5)
WBC: 8.2 10*3/uL (ref 4.0–10.5)
nRBC: 0 % (ref 0.0–0.2)

## 2022-01-04 LAB — COMPREHENSIVE METABOLIC PANEL
ALT: 14 U/L (ref 0–44)
AST: 20 U/L (ref 15–41)
Albumin: 3.6 g/dL (ref 3.5–5.0)
Alkaline Phosphatase: 129 U/L — ABNORMAL HIGH (ref 38–126)
Anion gap: 10 (ref 5–15)
BUN: 25 mg/dL — ABNORMAL HIGH (ref 8–23)
CO2: 22 mmol/L (ref 22–32)
Calcium: 8.5 mg/dL — ABNORMAL LOW (ref 8.9–10.3)
Chloride: 100 mmol/L (ref 98–111)
Creatinine, Ser: 1.1 mg/dL (ref 0.61–1.24)
GFR, Estimated: >60 mL/min (ref >60)
Glucose, Bld: 129 mg/dL — ABNORMAL HIGH (ref 70–99)
Potassium: 4.4 mmol/L (ref 3.5–5.1)
Sodium: 132 mmol/L — ABNORMAL LOW (ref 135–145)
Total Bilirubin: 1.6 mg/dL — ABNORMAL HIGH (ref 0.3–1.2)
Total Protein: 7.9 g/dL (ref 6.5–8.1)

## 2022-01-04 LAB — RESP PANEL BY RT-PCR (FLU A&B, COVID) ARPGX2
Influenza A by PCR: NEGATIVE
Influenza B by PCR: NEGATIVE
SARS Coronavirus 2 by RT PCR: NEGATIVE

## 2022-01-04 LAB — BRAIN NATRIURETIC PEPTIDE: B Natriuretic Peptide: 298.3 pg/mL — ABNORMAL HIGH (ref 0.0–100.0)

## 2022-01-04 MED ORDER — GUAIFENESIN 100 MG/5ML PO LIQD
5.0000 mL | Freq: Once | ORAL | Status: AC
  Administered 2022-01-04: 5 mL via ORAL
  Filled 2022-01-04: qty 10

## 2022-01-04 MED ORDER — LEVALBUTEROL TARTRATE 45 MCG/ACT IN AERO: 1.0000 | INHALATION_SPRAY | Freq: Four times a day (QID) | RESPIRATORY_TRACT | 12 refills | Status: DC | PRN

## 2022-01-04 MED ORDER — LEVALBUTEROL HCL 0.63 MG/3ML IN NEBU
0.6300 mg | INHALATION_SOLUTION | Freq: Once | RESPIRATORY_TRACT | Status: AC
  Administered 2022-01-04: 0.63 mg via RESPIRATORY_TRACT
  Filled 2022-01-04: qty 3

## 2022-01-04 MED ORDER — METHYLPREDNISOLONE SODIUM SUCC 125 MG IJ SOLR
125.0000 mg | Freq: Once | INTRAMUSCULAR | Status: AC
  Administered 2022-01-04: 125 mg via INTRAVENOUS
  Filled 2022-01-04: qty 2

## 2022-01-04 MED ORDER — PREDNISONE 20 MG PO TABS: ORAL_TABLET | ORAL | 0 refills | Status: DC

## 2022-01-04 MED ORDER — GUAIFENESIN ER 600 MG PO TB12: 600.0000 mg | ORAL_TABLET | Freq: Two times a day (BID) | ORAL | 0 refills | Status: AC | PRN

## 2022-01-04 NOTE — ED Triage Notes (Signed)
Pt states he has been having some shortness of breath since Saturday a week ago but it is worse today  Pt has been on prednisone and cough medication

## 2022-01-04 NOTE — ED Provider Notes (Signed)
[place] EMERGENCY DEPARTMENT Provider Note   CSN: [id number] Arrival date & time: 01/04/22  [id number]     History  Chief Complaint  Patient presents with   Shortness of Breath    [name] Rice is a 71 y.o. male.  Patient had been on prednisone and inhalers for what sounds like a viral bronchitis for ab out 5-6 days. This helped but when he stopped he slowly had worsening cough and sob especially when laying flat. No fevers. Cough productive for clear phlegmn. Never smoked. No COPD history. No edema. No abdominal distension.    Shortness of Breath Severity:  Moderate Onset quality:  Gradual Duration:  2 days Timing:  Constant Progression:  Worsening Chronicity:  Recurrent Context: not activity and not emotional upset   Relieved by:  Inhaler and sitting up Exacerbated by: laying flat. Associated symptoms: cough and fever   Associated symptoms: no abdominal pain, no hemoptysis, no sore throat and no vomiting       Home Medications Prior to Admission medications   Medication Sig Start Date End Date Taking? Authorizing Provider  guaiFENesin (MUCINEX) 600 MG 12 hr tablet Take 1 tablet (600 mg total) by mouth 2 (two) times daily as needed for up to 7 days. 01/04/22 01/11/22 Yes Nasreen Goedecke, Corene Cornea, MD  levalbuterol [facility] HFA) 45 MCG/ACT inhaler Inhale 1 puff into the lungs every 6 (six) hours as needed for wheezing or shortness of breath (or cough). 01/04/22  Yes Roylee Chaffin, Corene Cornea, MD  predniSONE (DELTASONE) 20 MG tablet 3 tabs po daily x 3 days, then 2 tabs x 3 days, then 1.5 tabs x 3 days, then 1 tab x 3 days, then 0.5 tabs x 3 days 01/05/22  Yes Zona Pedro, Corene Cornea, MD  Aspirin 81 MG CAPS Take 81 mg by mouth daily. 07/21/21   Melrose Nakayama, MD  metoprolol tartrate (LOPRESSOR) 25 MG tablet Take 0.5 tablets (12.5 mg total) by mouth daily. 06/03/21 09/01/21  Tolia, Sunit, DO  olmesartan (BENICAR) 5 MG tablet Take 1 tablet (5 mg total) by mouth daily. 07/21/21   Melrose Nakayama,  MD  rosuvastatin (CRESTOR) 20 MG tablet TAKE 1 TABLET BY MOUTH EVERYDAY AT BEDTIME 09/21/21   Tolia, Sunit, DO  XARELTO 20 MG TABS tablet TAKE 1 TABLET BY MOUTH IN  THE EVENING AFTER DINNER 10/05/21   Tolia, Sunit, DO      Allergies    Patient has no known allergies.    Review of Systems   Review of Systems  Constitutional:  Positive for fever.  HENT:  Negative for sore throat.   Respiratory:  Positive for cough and shortness of breath. Negative for hemoptysis.   Gastrointestinal:  Negative for abdominal pain and vomiting.   Physical Exam Updated Vital Signs BP (!) 133/56 (BP Location: Right Arm)    Pulse 84    Temp 98.9 F (37.2 C) (Oral)    Resp (!) 23    Ht 6\' 1"  (1.854 m)    Wt 102.9 kg    SpO2 100%    BMI 29.92 kg/m  Physical Exam Vitals and nursing note reviewed.  Constitutional:      Appearance: He is well-developed.  HENT:     Head: Normocephalic and atraumatic.  Neck:     Comments: No JVD Cardiovascular:     Rate and Rhythm: Normal rate.  Pulmonary:     Effort: Pulmonary effort is normal. No respiratory distress.     Breath sounds: Decreased breath sounds and rhonchi (cleared with  coughing) present. No wheezing.  Chest:     Chest wall: No tenderness.  Abdominal:     General: There is no distension.  Musculoskeletal:        General: Normal range of motion.     Cervical back: Normal range of motion.     Right lower leg: No edema.     Left lower leg: No edema.  Skin:    General: Skin is warm and dry.  Neurological:     Mental Status: He is alert.    ED Results / Procedures / Treatments   Labs (all labs ordered are listed, but only abnormal results are displayed) Labs Reviewed  CBC WITH DIFFERENTIAL/PLATELET - Abnormal; Notable for the following components:      Result Value   MCH 24.3 (*)    RDW 18.0 (*)    Monocytes Absolute 1.1 (*)    Abs Immature Granulocytes 0.08 (*)    All other components within normal limits  COMPREHENSIVE METABOLIC PANEL -  Abnormal; Notable for the following components:   Sodium 132 (*)    Glucose, Bld 129 (*)    BUN 25 (*)    Calcium 8.5 (*)    Alkaline Phosphatase 129 (*)    Total Bilirubin 1.6 (*)    All other components within normal limits  BRAIN NATRIURETIC PEPTIDE - Abnormal; Notable for the following components:   B Natriuretic Peptide 298.3 (*)    All other components within normal limits  RESP PANEL BY RT-PCR (FLU A&B, COVID) [id number]    EKG None  Radiology DG Chest 2 View  Result Date: 01/04/2022 CLINICAL DATA:  Dyspnea EXAM: CHEST - 2 VIEW COMPARISON:  09/29/2021 FINDINGS: Lungs are clear. No pneumothorax. Small left pleural effusion. Coronary artery bypass grafting has been performed. Cardiac size within normal limits. Pulmonary vascularity is normal. No acute bone abnormality. IMPRESSION: Stable small left pleural effusion. Electronically Signed   By: Fidela Salisbury M.D.   On: 01/04/2022 03:18    Procedures Procedures    Medications Ordered in ED Medications  levalbuterol (XOPENEX) nebulizer solution 0.63 mg (0.63 mg Nebulization Given 01/04/22 0319)  guaiFENesin (ROBITUSSIN) 100 MG/5ML liquid 5 mL (5 mLs Oral Given 01/04/22 0434)  methylPREDNISolone sodium succinate (SOLU-MEDROL) 125 mg/2 mL injection 125 mg (125 mg Intravenous Given 01/04/22 0429)    ED Course/ Medical Decision Making/ A&P                           Medical Decision Making Amount and/or Complexity of Data Reviewed Labs: ordered. Radiology: ordered.  Risk OTC drugs. Prescription drug management.   Suspect persistent bronchitis. Will eval fro covid/flu. Cxr and BNP to ensure not HF.   Xr viewed by me with possibly some small interstitial changes but no frank edema. Unchagned LLL pleural effusion from last year. Will try some xopenex and reeval.   Significantly improved with xpenex. BNP slightly up. Suspect bronchoconstriction as cause. Will give steroids, mucinex and reeval for likely discharge if improved  breathing when lying flat.   Feels better lying flat. Pendign covid.   Covid negative. Still feels well. D/c with pcp follow up.    Final Clinical Impression(s) / ED Diagnoses Final diagnoses:  Bronchitis    Rx / DC Orders ED Discharge Orders          Ordered    predniSONE (DELTASONE) 20 MG tablet        01/04/22 0547    levalbuterol (XOPENEX  HFA) 45 MCG/ACT inhaler  Every 6 hours PRN        01/04/22 0547    guaiFENesin (MUCINEX) 600 MG 12 hr tablet  2 times daily PRN        01/04/22 0547              Kraven Calk, Corene Cornea, MD 01/04/22 [id number]

## 2022-01-04 NOTE — ED Notes (Signed)
Rx x 3 given  Written and verbal inst to pt  Verbalized an understanding  To home with family  

## 2022-01-04 NOTE — ED Notes (Signed)
Patient transported to X-ray 

## 2022-01-24 ENCOUNTER — Other Ambulatory Visit: Payer: Self-pay | Admitting: Thoracic Surgery (Cardiothoracic Vascular Surgery)

## 2022-03-01 ENCOUNTER — Other Ambulatory Visit: Payer: Self-pay | Admitting: Cardiology

## 2022-03-07 ENCOUNTER — Other Ambulatory Visit: Payer: Self-pay | Admitting: Cardiology

## 2022-03-07 DIAGNOSIS — Z951 Presence of aortocoronary bypass graft: Secondary | ICD-10-CM

## 2022-03-07 DIAGNOSIS — I2510 Atherosclerotic heart disease of native coronary artery without angina pectoris: Secondary | ICD-10-CM

## 2022-03-07 DIAGNOSIS — E7800 Pure hypercholesterolemia, unspecified: Secondary | ICD-10-CM

## 2022-03-08 ENCOUNTER — Other Ambulatory Visit: Payer: Self-pay

## 2022-03-08 DIAGNOSIS — I2510 Atherosclerotic heart disease of native coronary artery without angina pectoris: Secondary | ICD-10-CM

## 2022-03-08 MED ORDER — OLMESARTAN MEDOXOMIL 5 MG PO TABS: 5.0000 mg | ORAL_TABLET | Freq: Every day | ORAL | 6 refills | Status: DC

## 2022-03-08 MED ORDER — METOPROLOL TARTRATE 25 MG PO TABS: 12.5000 mg | ORAL_TABLET | Freq: Every day | ORAL | 2 refills | Status: DC

## 2022-03-22 IMAGING — DX DG CHEST 2V
2 series · 2 of 2 positions shown · non-contrast
Comparison: August 05, 2020

CLINICAL DATA: Coronary artery disease. Recent coronary artery
bypass grafting

EXAM:
CHEST - 2 VIEW

[dg chest 2 view (1 of 2)]
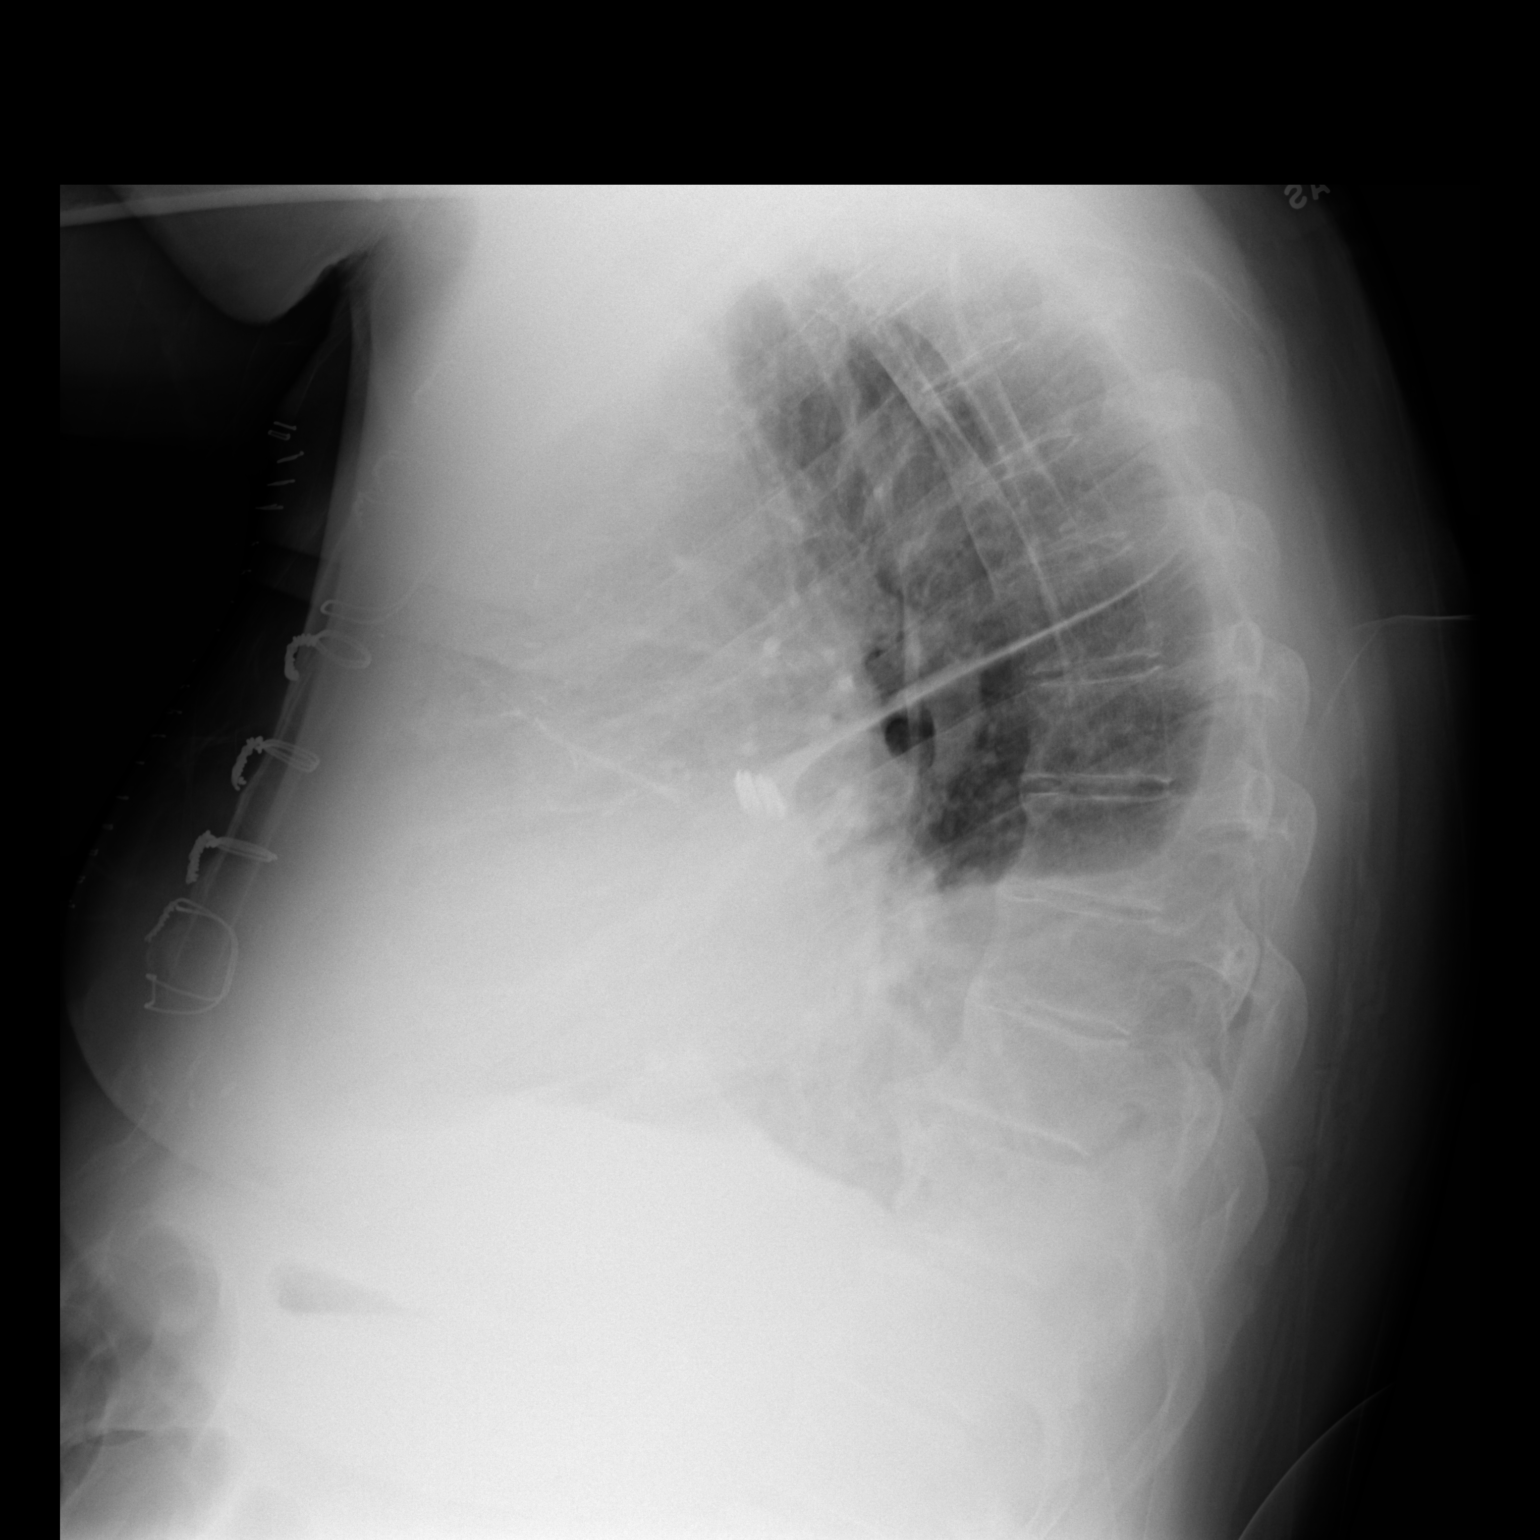

[dg chest 2 view (2 of 2)]
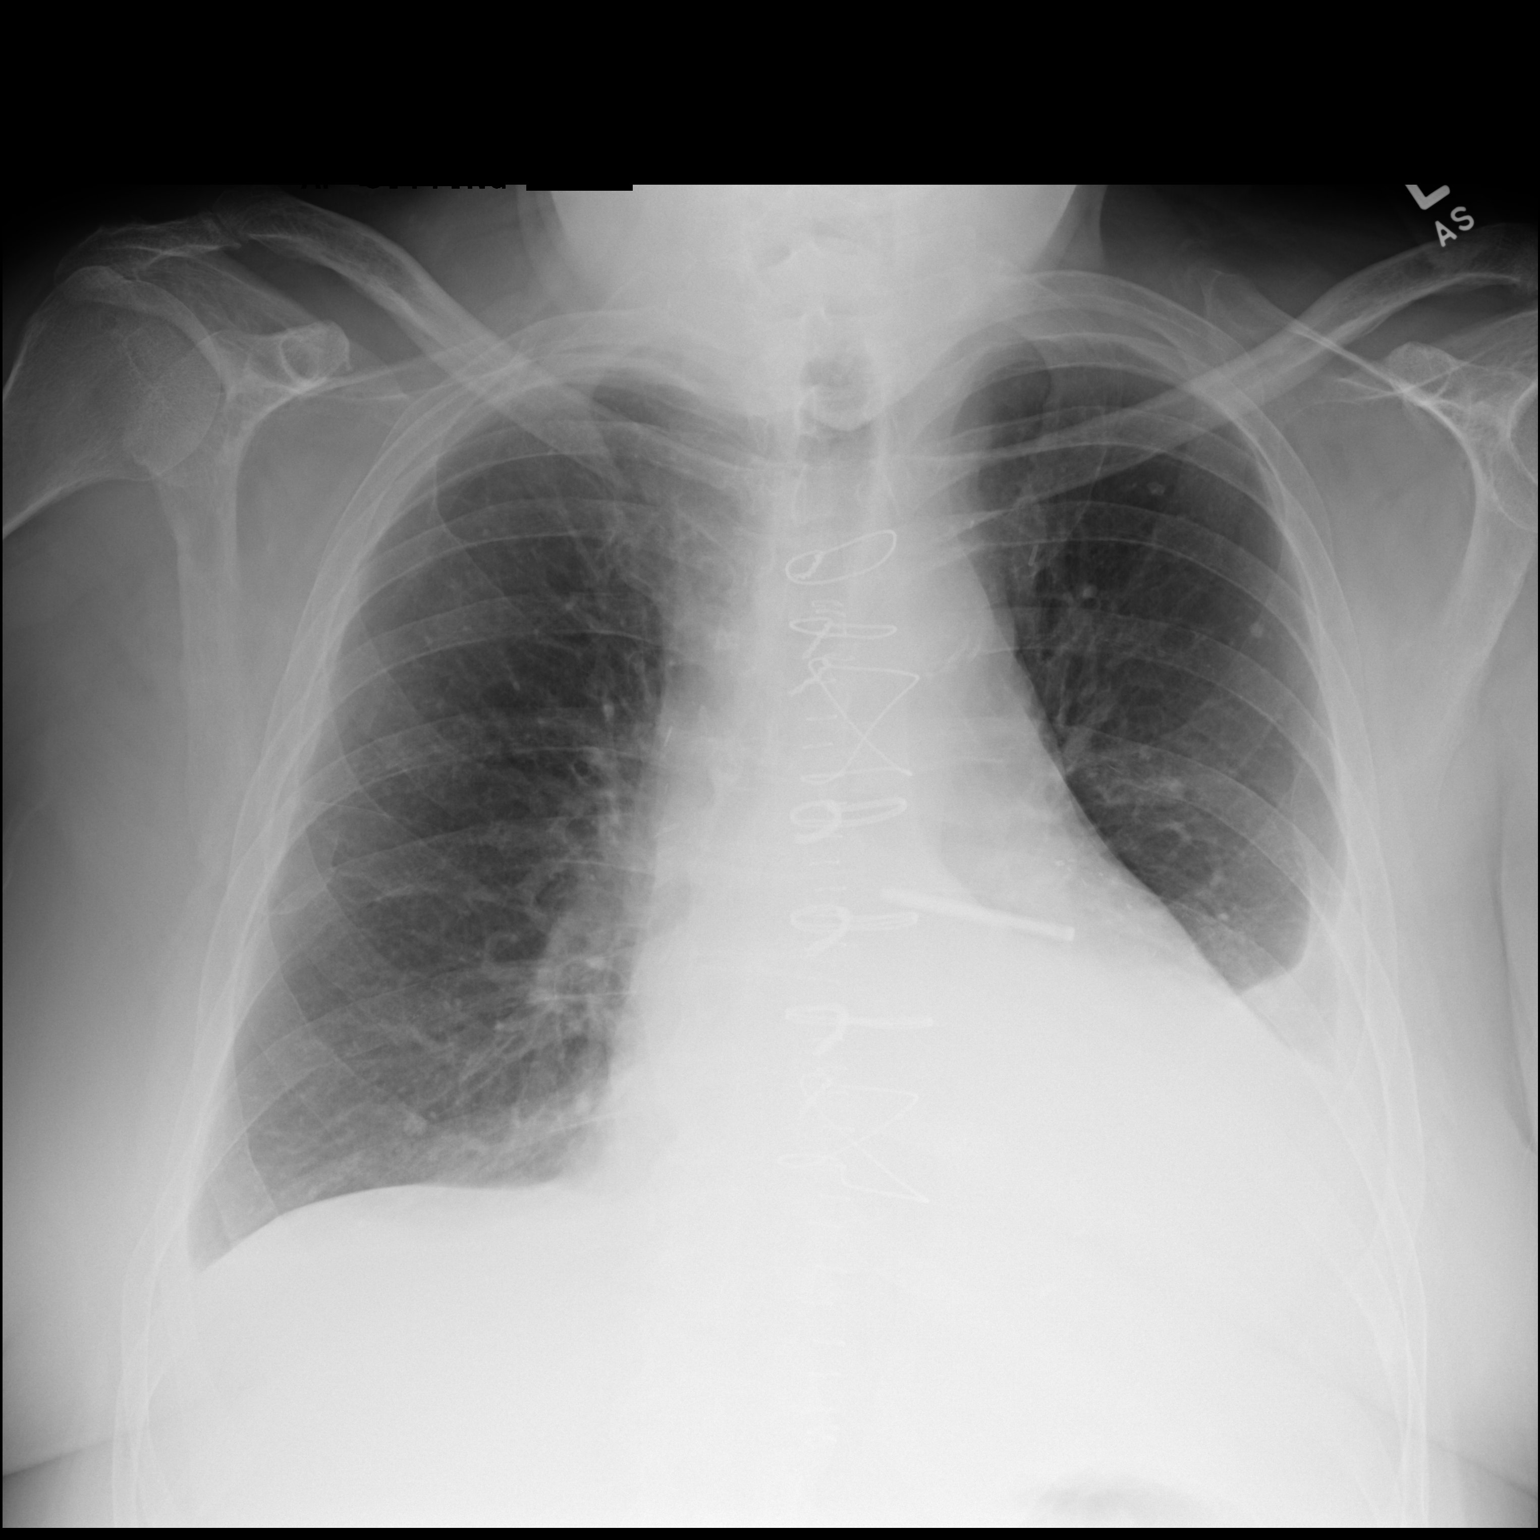

[2 of 2 positions shown; findings below may reference images not displayed]

FINDINGS: There is a persistent left pleural effusion with left base
atelectasis. There are scattered calcified granulomas. Lungs
elsewhere clear. There is cardiomegaly with pulmonary vascularity
normal. Patient is status post coronary artery bypass grafting with
left atrial appendage clamp present. There is aortic
atherosclerosis. No adenopathy. No bone lesions.
IMPRESSION: Persistent left pleural effusion with left base atelectasis.
Scattered calcified granulomas noted. Lungs elsewhere clear. Stable
cardiac enlargement with postoperative changes.

Aortic Atherosclerosis (FJOIN-BO4.4).

## 2022-04-12 IMAGING — DX DG CHEST 2V
2 series · 2 of 2 positions shown · non-contrast
Comparison: 08/11/2020

CLINICAL DATA: Previous coronary bypass, pleural effusion

EXAM:
CHEST - 2 VIEW

[dg chest 2 view (1 of 2)]
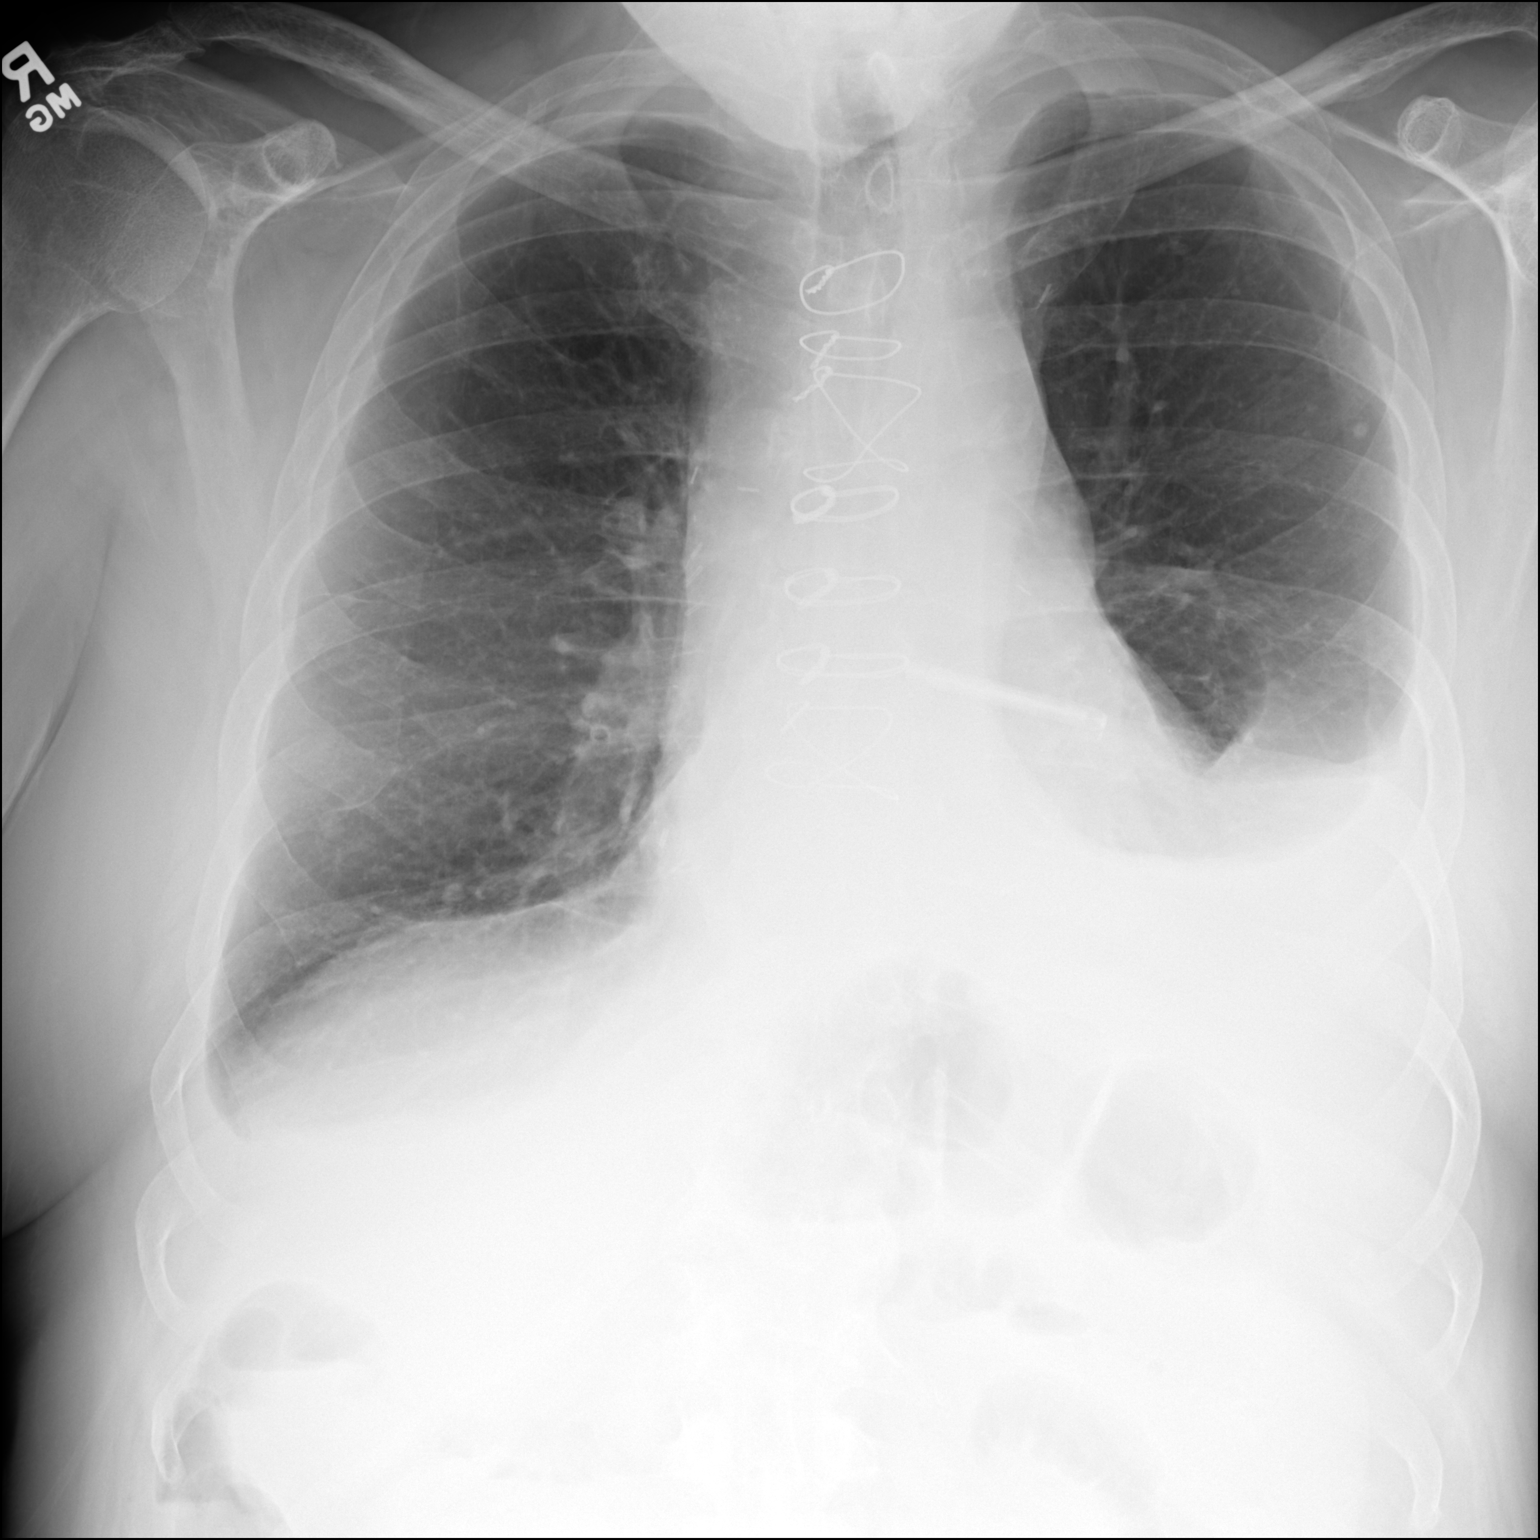

[dg chest 2 view (2 of 2)]
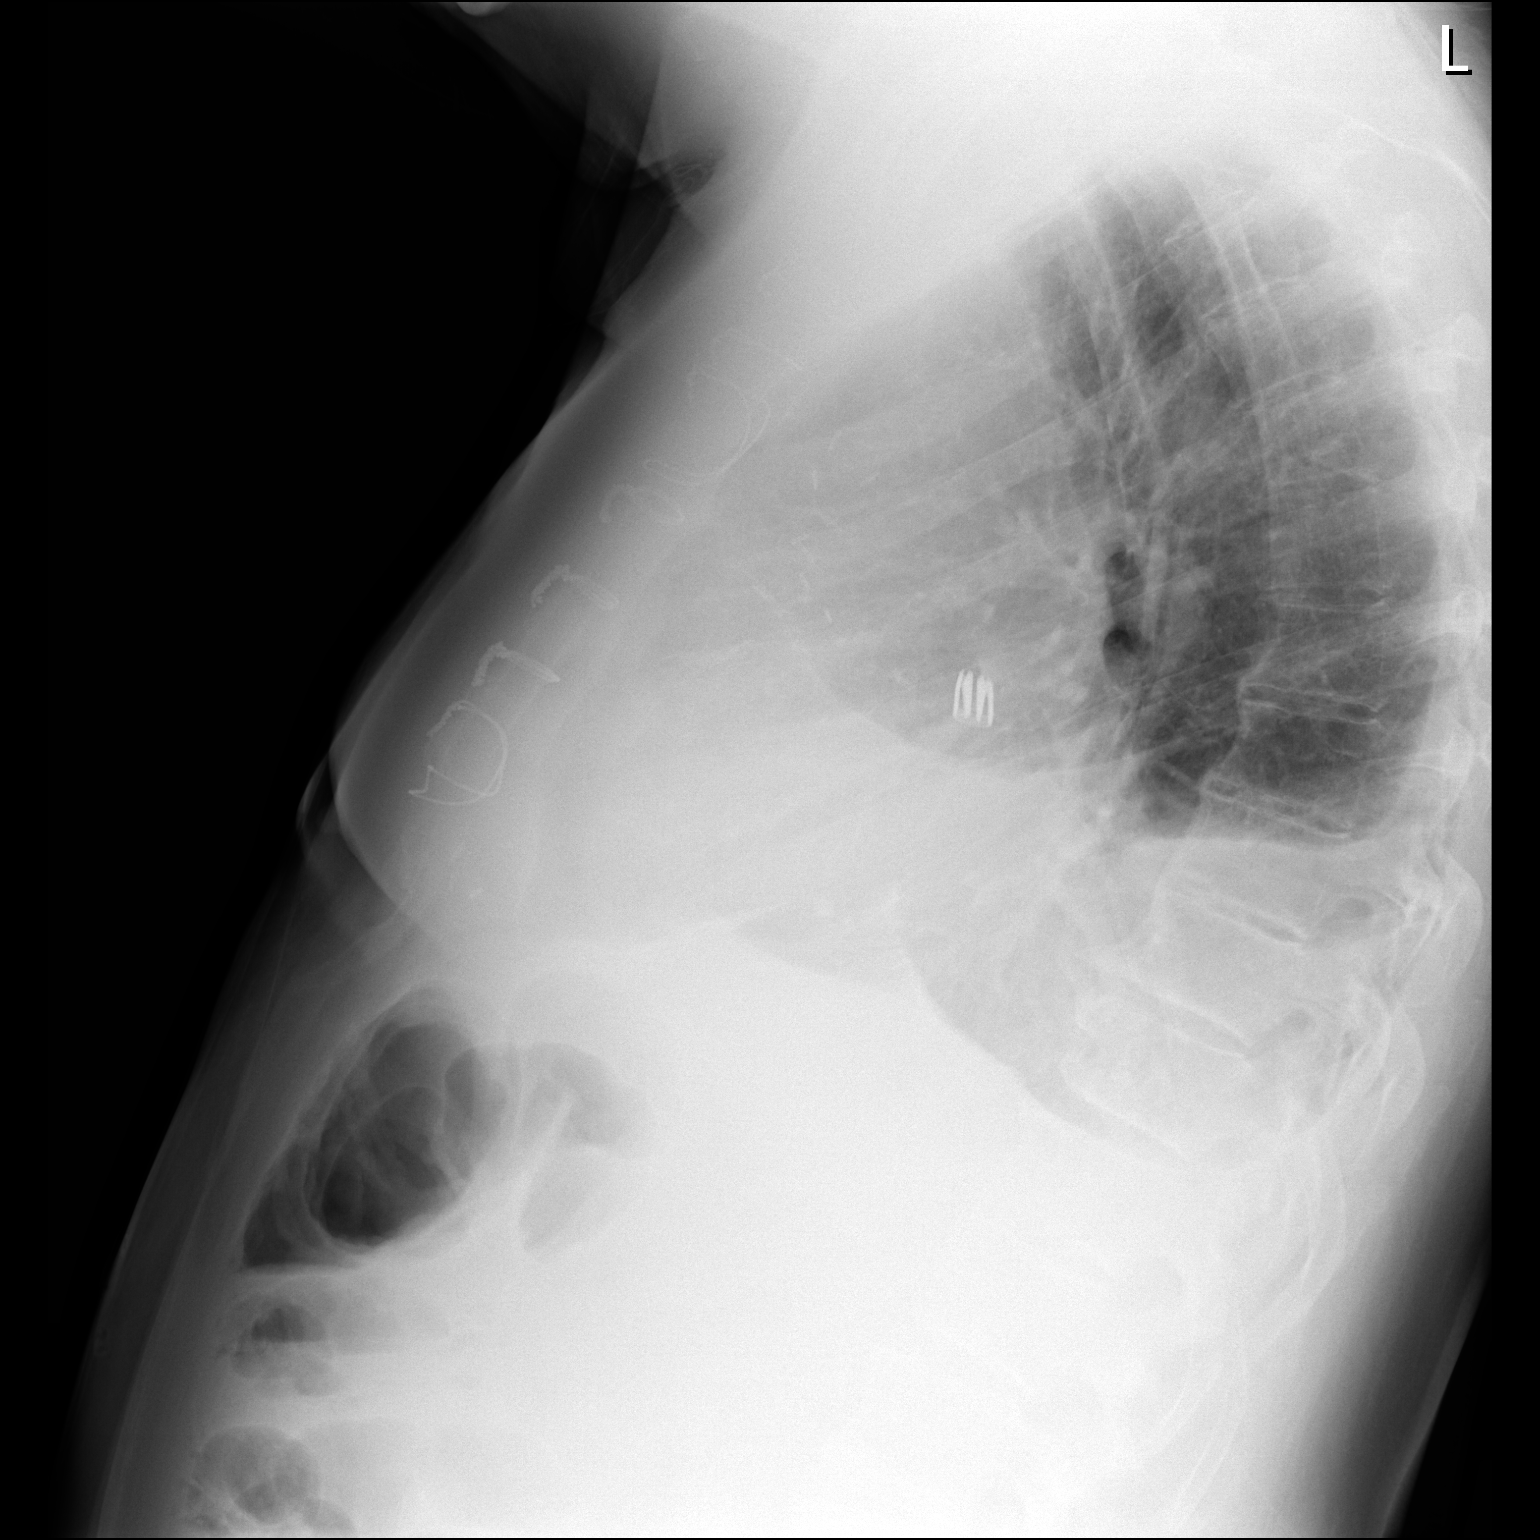

[2 of 2 positions shown; findings below may reference images not displayed]

FINDINGS: Previous coronary bypass changes noted. Similar small to moderate
left pleural effusion and trace right effusion. Minor associated
bibasilar atelectasis, worse on the left. Evidence of remote
granulomatous disease bilaterally with scattered punctate
granulomata. Stable heart size and vascularity. No pneumothorax.
IMPRESSION: Similar persistent small to moderate left effusion with left base
atelectasis.

Trace right effusion.

Remote granulomatous disease.

## 2022-04-29 ENCOUNTER — Other Ambulatory Visit: Payer: Self-pay

## 2022-04-29 ENCOUNTER — Encounter (HOSPITAL_BASED_OUTPATIENT_CLINIC_OR_DEPARTMENT_OTHER): Payer: Self-pay | Admitting: Pediatrics

## 2022-04-29 ENCOUNTER — Emergency Department (HOSPITAL_BASED_OUTPATIENT_CLINIC_OR_DEPARTMENT_OTHER): Payer: Medicare Other

## 2022-04-29 ENCOUNTER — Emergency Department (HOSPITAL_BASED_OUTPATIENT_CLINIC_OR_DEPARTMENT_OTHER)
Admission: EM | Admit: 2022-04-29 | Discharge: 2022-04-29 | Disposition: A | Discharge status: Home / Self Care | Payer: Medicare Other | Attending: Emergency Medicine | Admitting: Emergency Medicine

## 2022-04-29 DIAGNOSIS — Z79899 Other long term (current) drug therapy: Secondary | ICD-10-CM | POA: Insufficient documentation

## 2022-04-29 DIAGNOSIS — R778 Other specified abnormalities of plasma proteins: Secondary | ICD-10-CM | POA: Diagnosis not present

## 2022-04-29 DIAGNOSIS — Z7901 Long term (current) use of anticoagulants: Secondary | ICD-10-CM | POA: Diagnosis not present

## 2022-04-29 DIAGNOSIS — I110 Hypertensive heart disease with heart failure: Secondary | ICD-10-CM | POA: Insufficient documentation

## 2022-04-29 DIAGNOSIS — I4891 Unspecified atrial fibrillation: Secondary | ICD-10-CM | POA: Diagnosis not present

## 2022-04-29 DIAGNOSIS — I509 Heart failure, unspecified: Secondary | ICD-10-CM | POA: Insufficient documentation

## 2022-04-29 DIAGNOSIS — R0602 Shortness of breath: Secondary | ICD-10-CM | POA: Diagnosis present

## 2022-04-29 DIAGNOSIS — I2510 Atherosclerotic heart disease of native coronary artery without angina pectoris: Secondary | ICD-10-CM | POA: Diagnosis not present

## 2022-04-29 DIAGNOSIS — Z7982 Long term (current) use of aspirin: Secondary | ICD-10-CM | POA: Insufficient documentation

## 2022-04-29 DIAGNOSIS — R600 Localized edema: Secondary | ICD-10-CM | POA: Diagnosis not present

## 2022-04-29 DIAGNOSIS — J90 Pleural effusion, not elsewhere classified: Secondary | ICD-10-CM

## 2022-04-29 DIAGNOSIS — Z951 Presence of aortocoronary bypass graft: Secondary | ICD-10-CM | POA: Diagnosis not present

## 2022-04-29 LAB — BASIC METABOLIC PANEL
Anion gap: 8 (ref 5–15)
BUN: 22 mg/dL (ref 8–23)
CO2: 24 mmol/L (ref 22–32)
Calcium: 8.5 mg/dL — ABNORMAL LOW (ref 8.9–10.3)
Chloride: 103 mmol/L (ref 98–111)
Creatinine, Ser: 1.08 mg/dL (ref 0.61–1.24)
GFR, Estimated: >60 mL/min (ref >60)
Glucose, Bld: 114 mg/dL — ABNORMAL HIGH (ref 70–99)
Potassium: 4.3 mmol/L (ref 3.5–5.1)
Sodium: 135 mmol/L (ref 135–145)

## 2022-04-29 LAB — CBC
HCT: 39.1 % (ref 39.0–52.0)
Hemoglobin: 11.2 g/dL — ABNORMAL LOW (ref 13.0–17.0)
MCH: 23.4 pg — ABNORMAL LOW (ref 26.0–34.0)
MCHC: 28.6 g/dL — ABNORMAL LOW (ref 30.0–36.0)
MCV: 81.6 fL (ref 80.0–100.0)
Platelets: 242 10*3/uL (ref 150–400)
RBC: 4.79 MIL/uL (ref 4.22–5.81)
RDW: 16.4 % — ABNORMAL HIGH (ref 11.5–15.5)
WBC: 6.6 10*3/uL (ref 4.0–10.5)
nRBC: 0 % (ref 0.0–0.2)

## 2022-04-29 LAB — TROPONIN I (HIGH SENSITIVITY)
Troponin I (High Sensitivity): 22 ng/L — ABNORMAL HIGH (ref <18)
Troponin I (High Sensitivity): 22 ng/L — ABNORMAL HIGH (ref <18)

## 2022-04-29 LAB — BRAIN NATRIURETIC PEPTIDE: B Natriuretic Peptide: 1243.5 pg/mL — ABNORMAL HIGH (ref 0.0–100.0)

## 2022-04-29 LAB — PROTIME-INR
INR: 1.7 — ABNORMAL HIGH (ref 0.8–1.2)
Prothrombin Time: 19.8 seconds — ABNORMAL HIGH (ref 11.4–15.2)

## 2022-04-29 MED ORDER — IPRATROPIUM BROMIDE 0.02 % IN SOLN
0.5000 mg | Freq: Once | RESPIRATORY_TRACT | Status: AC
  Administered 2022-04-29: 0.5 mg via RESPIRATORY_TRACT
  Filled 2022-04-29: qty 2.5

## 2022-04-29 MED ORDER — FUROSEMIDE 10 MG/ML IJ SOLN
40.0000 mg | Freq: Once | INTRAMUSCULAR | Status: AC
  Administered 2022-04-29: 40 mg via INTRAVENOUS
  Filled 2022-04-29: qty 4

## 2022-04-29 MED ORDER — ALBUTEROL SULFATE (2.5 MG/3ML) 0.083% IN NEBU
5.0000 mg | INHALATION_SOLUTION | Freq: Once | RESPIRATORY_TRACT | Status: AC
  Administered 2022-04-29: 5 mg via RESPIRATORY_TRACT
  Filled 2022-04-29: qty 6

## 2022-04-29 MED ORDER — FUROSEMIDE 20 MG PO TABS: 20.0000 mg | ORAL_TABLET | Freq: Every day | ORAL | 0 refills | Status: DC

## 2022-04-29 NOTE — ED Notes (Signed)
Patient states that he has had sob for a year. States that he gets shortness of breath with activity.  Patient is breathing unlabored

## 2022-04-29 NOTE — Discharge Instructions (Signed)
Your history, exam, work-up today are consistent with some extra fluid overload likely related to some underlying heart failure.  Dr. Anitra Lauth spoke with the internal medicine team for admission and they felt that as you are not hypoxic you are reasonable to try going home.  As the diuretics seem to be getting the fluid off, we feel that is reasonable.  You maintain oxygen saturations here both at rest and with ambulation.  Please take the diuretics and follow-up with your cardiology team this week.  Please rest and if any symptoms change or worsen acutely, please return to the nearest emergency department.

## 2022-04-29 NOTE — ED Notes (Signed)
AMBULATING AROUND DEPARTMENT:Beginning HR 78 , O2 98 sitting in bed. Pt c/o increased shortness of breath with exertion. During the walk, pt was unassisted, in NAD. SpO2 ranged from 93-96 % and HR 78-103.

## 2022-04-29 NOTE — ED Triage Notes (Signed)
Reported feeling unwell and increased shortness of breathe the last 3 days; worst with activity;

## 2022-04-30 ENCOUNTER — Other Ambulatory Visit: Payer: Self-pay | Admitting: Cardiology

## 2022-04-30 DIAGNOSIS — I119 Hypertensive heart disease without heart failure: Secondary | ICD-10-CM

## 2022-05-03 ENCOUNTER — Telehealth: Payer: Self-pay

## 2022-05-04 ENCOUNTER — Other Ambulatory Visit: Payer: Self-pay

## 2022-05-04 DIAGNOSIS — I5022 Chronic systolic (congestive) heart failure: Secondary | ICD-10-CM

## 2022-05-04 MED ORDER — FUROSEMIDE 20 MG PO TABS: 20.0000 mg | ORAL_TABLET | Freq: Every day | ORAL | 0 refills | Status: DC

## 2022-05-13 LAB — BASIC METABOLIC PANEL
BUN/Creatinine Ratio: 16 (ref 10–24)
BUN: 19 mg/dL (ref 8–27)
CO2: 25 mmol/L (ref 20–29)
Calcium: 9.2 mg/dL (ref 8.6–10.2)
Chloride: 99 mmol/L (ref 96–106)
Creatinine, Ser: 1.22 mg/dL (ref 0.76–1.27)
Glucose: 57 mg/dL — ABNORMAL LOW (ref 70–99)
Potassium: 4.6 mmol/L (ref 3.5–5.2)
Sodium: 137 mmol/L (ref 134–144)
eGFR: 64 mL/min/{1.73_m2} (ref >59)

## 2022-05-13 LAB — PRO B NATRIURETIC PEPTIDE: NT-Pro BNP: 7782 pg/mL — ABNORMAL HIGH (ref 0–376)

## 2022-05-13 LAB — MAGNESIUM: Magnesium: 2.7 mg/dL — ABNORMAL HIGH (ref 1.6–2.3)

## 2022-05-14 ENCOUNTER — Encounter: Payer: Self-pay | Admitting: Cardiology

## 2022-05-14 ENCOUNTER — Ambulatory Visit: Payer: Medicare Other | Admitting: Cardiology

## 2022-05-14 VITALS — BP 143/53 | HR 59 | Temp 98.0°F | Resp 16 | Ht 73.0 in | Wt 220.0 lb

## 2022-05-14 DIAGNOSIS — I129 Hypertensive chronic kidney disease with stage 1 through stage 4 chronic kidney disease, or unspecified chronic kidney disease: Secondary | ICD-10-CM

## 2022-05-14 DIAGNOSIS — I4821 Permanent atrial fibrillation: Secondary | ICD-10-CM

## 2022-05-14 DIAGNOSIS — I2510 Atherosclerotic heart disease of native coronary artery without angina pectoris: Secondary | ICD-10-CM

## 2022-05-14 DIAGNOSIS — I447 Left bundle-branch block, unspecified: Secondary | ICD-10-CM

## 2022-05-14 DIAGNOSIS — Z951 Presence of aortocoronary bypass graft: Secondary | ICD-10-CM

## 2022-05-14 DIAGNOSIS — Z8679 Personal history of other diseases of the circulatory system: Secondary | ICD-10-CM

## 2022-05-14 DIAGNOSIS — Z9889 Other specified postprocedural states: Secondary | ICD-10-CM

## 2022-05-14 DIAGNOSIS — R0602 Shortness of breath: Secondary | ICD-10-CM

## 2022-05-14 DIAGNOSIS — I5042 Chronic combined systolic (congestive) and diastolic (congestive) heart failure: Secondary | ICD-10-CM

## 2022-05-14 DIAGNOSIS — Z7901 Long term (current) use of anticoagulants: Secondary | ICD-10-CM

## 2022-05-14 MED ORDER — FUROSEMIDE 20 MG PO TABS: 20.0000 mg | ORAL_TABLET | Freq: Every day | ORAL | 0 refills | Status: DC | PRN

## 2022-05-14 MED ORDER — DAPAGLIFLOZIN PROPANEDIOL 10 MG PO TABS: 10.0000 mg | ORAL_TABLET | Freq: Every day | ORAL | 0 refills | Status: DC

## 2022-05-14 NOTE — Progress Notes (Signed)
[name] Rice Date of Birth: April 13, 1951 MRN: [id number] Primary Care Provider:Pcp, No Former Cardiology Providers: Dr. Rudean Hitt, APRN, FNP-C Primary Cardiologist: Tessa Lerner, DO, [facility] (established care 01/14/2020)  Date: 05/14/22 Last Office Visit: 08/17/2021  Chief Complaint  Patient presents with   Shortness of Breath   Hospitalization Follow-up   HPI  [name] Rice is a 71 y.o.  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  Patient was last seen in the office in October 2022 and we discussed carotid disease management and he was being referred to vascular surgery.  Presents to the office today after recent ER visit for shortness of breath.  Based on the records and his symptoms he had an acute exacerbation of combined systolic and diastolic heart failure.  He was started on Lasix 20 mg p.o. nightly and asked to follow-up with cardiology.  Patient states that he is responded well to Lasix and no longer experiences shortness of breath with day-to-day activities.  Patient states that this is the first time in the last 2 years that he is walked up stairs that lead up to the office door without becoming short of breath.  He denies anginal discomfort.  ALLERGIES: No Known Allergies  MEDICATION LIST PRIOR TO VISIT: Current Outpatient Medications on File Prior to Visit  Medication Sig Dispense Refill   Aspirin 81 MG CAPS Take 81 mg by mouth daily. 30 capsule 6    levalbuterol (XOPENEX HFA) 45 MCG/ACT inhaler Inhale 1 puff into the lungs every 6 (six) hours as needed for wheezing or shortness of breath (or cough). 1 each 12   metoprolol tartrate (LOPRESSOR) 25 MG tablet Take 0.5 tablets (12.5 mg total) by mouth daily. 15 tablet 2   olmesartan (BENICAR) 5 MG tablet TAKE 2 TABLETS BY MOUTH  DAILY (Patient taking differently: Take 5 mg by mouth daily.) 180 tablet 3   rosuvastatin (CRESTOR) 20 MG tablet TAKE 1 TABLET BY MOUTH DAILY AT  BEDTIME 100 tablet 2   sertraline (ZOLOFT) 25 MG tablet Take 25 mg by mouth daily.     XARELTO 20 MG TABS tablet TAKE 1 TABLET BY MOUTH IN THE  EVENING AFTER DINNER 70 tablet 4   No current facility-administered medications on file prior to visit.    PAST MEDICAL HISTORY: Past Medical History:  Diagnosis Date   Atrial fibrillation (HCC)    Carotid artery stenosis    CHF (congestive heart failure) (HCC)    Coronary artery disease    Dysrhythmia    Hyperlipidemia    Hypertension    LBBB (left bundle branch block)     PAST SURGICAL HISTORY: Past Surgical History:  Procedure Laterality Date   CARDIOVERSION N/A 09/28/2016   Procedure: CARDIOVERSION;  Surgeon: Yates Decamp, MD;  Location: [facility] ENDOSCOPY;  Service: Cardiovascular;  Laterality: N/A;   CATARACT EXTRACTION, BILATERAL     CHEST TUBE INSERTION N/A 06/24/2021   Procedure: CHEST TUBE INSERTION;  Surgeon: Martina Sinner, MD;  Location: [facility] ENDOSCOPY;  Service: Pulmonary;  Laterality: N/A;   CHOLECYSTECTOMY N/A 09/18/2020   Procedure: LAPAROSCOPIC PARTIAL CHOLECYSTECTOMY;  Surgeon: Ovidio Kin, MD;  Location:  [facility] ORS;  Service: General;  Laterality: N/A;   CLIPPING OF ATRIAL APPENDAGE Left 07/29/2020   Procedure: CLIPPING OF ATRIAL APPENDAGE;  Surgeon: Wonda Olds, MD;  Location: [place];  Service: Open Heart Surgery;  Laterality: Left;   CORONARY ARTERY BYPASS GRAFT N/A 07/29/2020   Procedure: CORONARY ARTERY BYPASS GRAFTING (CABG) TIMES FOUR USING BILATERAL  INTERNAL MAMMARIES AND LEFT RADIAL ARTERY;  Surgeon: Wonda Olds, MD;  Location: [place];  Service: Open Heart Surgery;  Laterality: N/A;   ERCP N/A 09/17/2020   Procedure: ENDOSCOPIC RETROGRADE CHOLANGIOPANCREATOGRAPHY (ERCP);  Surgeon: Ladene Artist, MD;  Location: [facility] ENDOSCOPY;  Service: Endoscopy;  Laterality: N/A;   IR THORACENTESIS ASP PLEURAL SPACE W/IMG GUIDE  06/23/2021   LEFT HEART CATH AND CORONARY ANGIOGRAPHY N/A 07/25/2020   Procedure: LEFT HEART CATH AND CORONARY ANGIOGRAPHY;  Surgeon: Adrian Prows, MD;  Location: [place] CV LAB;  Service: Cardiovascular;  Laterality: N/A;   MAZE N/A 07/29/2020   Procedure: MAZE;  Surgeon: Wonda Olds, MD;  Location: [place];  Service: Open Heart Surgery;  Laterality: N/A;   RADIAL ARTERY HARVEST Left 07/29/2020   Procedure: RADIAL ARTERY HARVEST;  Surgeon: Wonda Olds, MD;  Location: [place];  Service: Open Heart Surgery;  Laterality: Left;   REMOVAL OF STONES  09/17/2020   Procedure: REMOVAL OF STONES;  Surgeon: Ladene Artist, MD;  Location: [facility] ENDOSCOPY;  Service: Endoscopy;;   SPHINCTEROTOMY  09/17/2020   Procedure: Joan Mayans;  Surgeon: Ladene Artist, MD;  Location: [facility] ENDOSCOPY;  Service: Endoscopy;;   TEE WITHOUT CARDIOVERSION N/A 07/29/2020   Procedure: TRANSESOPHAGEAL ECHOCARDIOGRAM (TEE);  Surgeon: Wonda Olds, MD;  Location: [place];  Service: Open Heart Surgery;  Laterality: N/A;    FAMILY HISTORY: The patient's family history includes Heart disease in his father and mother; Hyperlipidemia in his mother; Hypertension in an other family member.   SOCIAL HISTORY:  The patient  reports that he has never smoked. He has never used smokeless tobacco. He reports that he does not drink alcohol and does not use drugs.  Review of Systems  Constitutional: Negative for chills and fever.  HENT:  Negative for hoarse voice and nosebleeds.   Eyes:  Negative for discharge, double vision and pain.  Cardiovascular:   Negative for chest pain, claudication, dyspnea on exertion, leg swelling, near-syncope, orthopnea, palpitations, paroxysmal nocturnal dyspnea and syncope.  Respiratory:  Negative for cough, hemoptysis and shortness of breath.   Musculoskeletal:  Negative for muscle cramps and myalgias.  Gastrointestinal:  Negative for abdominal pain, constipation, diarrhea, hematemesis, hematochezia, melena, nausea and vomiting.  Neurological:  Negative for dizziness and light-headedness.     PHYSICAL EXAM:    05/14/2022    3:19 PM 04/29/2022    5:00 PM 04/29/2022    4:57 PM  Vitals with BMI  Height 6\' 1"     Weight 220 lbs    BMI [id number]    Systolic [id number]   Diastolic 53 70   Pulse 59 71 67   CONSTITUTIONAL: Appears older than stated age, hemodynamically stable, no acute distress.   SKIN: Skin is warm and dry. No rash noted. No cyanosis. No pallor. No jaundice HEAD: Normocephalic and atraumatic.  EYES: No scleral icterus MOUTH/THROAT: Moist oral membranes.  Poor oral dentition. NECK: JVD present. No thyromegaly noted.  Mild bilateral carotid bruits  CHEST Normal respiratory effort. No intercostal retractions.  Sternotomy site healed well. LUNGS: Clear to auscultation bilaterally.  No wheezes rales or rhonchi's.  CARDIOVASCULAR: Irregularly irregular, positive S1-S2, no murmurs rubs or gallops appreciated. ABDOMINAL: Soft, nontender, nondistended, positive bowel sounds in all 4 quadrants, no apparent ascites.  EXTREMITIES: No peripheral edema.  Warm to touch. HEMATOLOGIC: No significant bruising. NEUROLOGIC: Oriented to person, place, and time. Nonfocal. Normal muscle tone.  PSYCHIATRIC: Normal mood and affect. Normal behavior. Cooperative   RADIOLOGY: 08/11/2020 CXR:  Persistent left pleural effusion with left base atelectasis. Scattered calcified granulomas noted. Lungs elsewhere clear. Stable cardiac enlargement with postoperative changes.  Aortic Atherosclerosis  CARDIAC  DATABASE: EKG: 05/14/2022: Atrial fibrillation, 54 bpm, LVH, IVCD suggestive of left bundle branch block, consider old anteroseptal and lateral infarct.  Coronary artery bypass grafting: 07/29/2020 (by Dr. Vickey Sages at [facility]):  Left Internal Mammary Artery to Distal Left Anterior Descending Coronary Artery; pedicled RIMA Graft to Posterior Descending Coronary Artery; left radial artery  Graft to Obtuse Marginal Branch of Left Circumflex Coronary Artery and ramus intermedius as a sequenced graft. Bi-atrial Maze procedure and left atrial appendage clipping  Echocardiogram: 12/31/2019: Mildly depressed LV systolic function with visual EF 45-50%. Left ventricle cavity is normal in size. Left ventricle regional wall motion findings: Mid anteroseptal, Mid inferoseptal, Apical anterior, Apical septal and Apical cap hypokinesis. Mild left ventricular hypertrophy.  Unable to evaluate diastolic function due to atrial fibrillation.  Left atrial cavity is severely dilated. Right atrial cavity is slightly dilated. Right ventricle cavity is normal in size. Low normal right ventricular function. Mild aortic valve leaflet thickening with mild calcification. Mildly restricted aortic valve leaflets. No evidence of aortic valve stenosis. No aortic valve regurgitation. Mild tricuspid regurgitation. Mild pulmonary hypertension. RVSP measures 35 mmHg. IVC is dilated with a respiratory response of >50%.   09/27/2020: 1. Left ventricular ejection fraction, by estimation, is 45 to 50%,  although evaluation limited due to poor visualization of the endocardium.  The left ventricle has mildly decreased function. The left ventricle  demonstrates regional wall motion  abnormalities (see scoring diagram/findings for description). Left  ventricular diastolic parameters are indeterminate.   2. Right ventricular systolic function is normal. The right ventricular  size is normal.   3. Left atrial size was mildly dilated.    4. Right atrial size was mildly dilated.   5. Normal right atrial pressure.   6. Compared to previous study on 07/24/2020, post-op changes are new.   Stress Testing:  02/18/2020: No previous exam available for comparison. Lexiscan nuclear stress test performed using 1-day protocol. Stress EKG is non-diagnostic, as this is pharmacological stress test. In addition, rest and stress EKG showed atrial fibrillation with slow ventricular response, anterolateral T wave inversion. Stress LVEF 77%. Normal myocardial perfusion. Low risk study.  Heart Catheterization: 07/25/20:  RCA: Proximal RCA 80% stenosis, large vessel with mild disease in PL and PDA branches.  A secondary PL branch is subtotally occluded. LM: Distal 30-40% stenosis. LAD: LAD diffusely diseased, proximal diffuse 80% followed by tandem 90% stenosis.  Large D1 with moderate diffuse disease and proximal and mid tandem 70 and 80% stenosis.  Has secondary branches and is tortuous. Cx  and RI: Co-dominant Cx with Ostial circumflex 99% involving a moderate sized ramus with ostial 80 to 90% and proximal 90% stenosis. LV: Normal LVEDP.  No pressure gradient across the aortic valve. Patent LIMA and RIMA and RIMA has ostial 20-30% stenosis. Subclavian arteries widely patent.  Right radial diffusely disease by [place]. 3mL contrast used.   Carotid duplex: 07/27/2020:  Right Carotid: Velocities in the right ICA are consistent with a  1-39% stenosis.  Left Carotid: Velocities in the left ICA are consistent with a 60-79% stenosis.  Vertebrals: Bilateral vertebral arteries demonstrate antegrade flow.   08/12/2021: Duplex suggests stenosis in the right internal carotid artery (>=70%).  Duplex suggests stenosis in the right external carotid artery (<50%). Duplex suggests stenosis in the left internal carotid artery (>=70%).  Duplex suggests stenosis in the left external carotid artery (<50%). The PSV internal/common carotid artery ratio is 6.49 on  the right and 4.52 on the left consistent with a stenosis of >70% bilaterally. Antegrade left vertebral artery flow. No significant change compared to 01/13/2021. Follow up in six months is appropriate if clinically indicated.  14 day extended Holter monitor: Dominant rhythm atrial fibrillation (HR between 25-137bpm, avg. 59bpm).  Overall HR 25-197 bpm.  Avg HR 59 bpm. 1519 pauses that were 3 secs or longer.  The longest pause 9.5 sec at 12:36 AM (07/04/2020), followed by 9.4 sec pause at 4:30 AM (07/11/2020), third longest pause 8.9 sec (07/08/2020).  These are auto-triggered events. 20 episodes of NSVT reported.  Longest episode 20 beats in duration for 10.5 seconds at an average rate 116 bpm.  The fastest episode was 6 beats in duration at average rate of 170 bpm.  Total ventricular ectopic burden <1%. Patient activated events: 0.  LABORATORY DATA:    Latest Ref Rng & Units 04/29/2022    1:19 PM 01/04/2022    2:28 AM 07/05/2021   12:48 AM  CBC  WBC 4.0 - 10.5 K/uL 6.6  8.2  6.9   Hemoglobin 13.0 - 17.0 g/dL 11.2  13.0  8.5   Hematocrit 39.0 - 52.0 % 39.1  43.0  28.5   Platelets 150 - 400 K/uL 242  278  400        Latest Ref Rng & Units 05/12/2022   10:10 AM 04/29/2022    1:19 PM 01/04/2022    2:28 AM  CMP  Glucose 70 - 99 mg/dL 57  114  129   BUN 8 - 27 mg/dL 19  22  25    Creatinine 0.76 - 1.27 mg/dL 1.22  1.08  1.10   Sodium 134 - 144 mmol/L 137  135  132   Potassium 3.5 - 5.2 mmol/L 4.6  4.3  4.4   Chloride 96 - 106 mmol/L 99  103  100   CO2 20 - 29 mmol/L 25  24  22    Calcium 8.6 - 10.2 mg/dL 9.2  8.5  8.5   Total Protein 6.5 - 8.1 g/dL   7.9   Total Bilirubin 0.3 - 1.2 mg/dL   1.6   Alkaline Phos 38 - 126 U/L   129   AST 15 - 41 U/L   20   ALT 0 - 44 U/L   14     Lipid Panel  Lab Results  Component Value Date   CHOL 132 01/20/2021   HDL 40 01/20/2021   LDLCALC 74 01/20/2021   LDLDIRECT 71 01/20/2021   TRIG 93 01/20/2021   CHOLHDL 3.3 07/26/2020     Lab Results   Component Value Date   HGBA1C 5.2 07/26/2020   No components found for: "NTPROBNP" Lab Results  Component Value Date   TSH 1.610 11/13/2020   TSH 1.083 09/16/2020   TSH 0.941 07/24/2020    Cardiac Panel (last 3 results) No results for input(s): "CKTOTAL", "CKMB", "TROPONINIHS", "RELINDX" in the last 72 hours.   IMPRESSION:    ICD-10-CM   1. Chronic combined systolic  and diastolic heart failure (HCC)  I50.42 dapagliflozin propanediol (FARXIGA) 10 MG TABS tablet    furosemide (LASIX) 20 MG tablet    PCV ECHOCARDIOGRAM COMPLETE    Pro b natriuretic peptide (BNP)    Basic metabolic panel    Magnesium    2. Shortness of breath  R06.02 EKG 12-Lead    3. Atherosclerosis of native coronary artery of native heart without angina pectoris  I25.10     4. Hx of CABG  Z95.1     5. Permanent atrial fibrillation (HCC)  I48.21     6. S/P left atrial appendage ligation  Z98.890     7. Long term current use of anticoagulant  Z79.01     8. S/P Maze operation for atrial fibrillation  Z98.890    Z86.79     9. Left bundle branch block  I44.7     10. Benign hypertension with CKD (chronic kidney disease) stage III (HCC)  I12.9    N18.30         RECOMMENDATIONS: TYRES WIERZBA is a 71 y.o. 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  Chronic combined systolic and diastolic heart failure (HCC) Stage C, NYHA class II/III.  Per patient, responded well to Lasix 20  mg p.o. nightly provided by the ED provider. Labs noted elevated NT proBNP and JVP on physical examination. Start Farxiga 10 mg p.o. daily. Labs in 1 week to evaluate kidney function and electrolytes. We will refill Lasix at 20 mg p.o. as needed.  Patient is asked to keep a log of his blood pressures and weight on regular basis and if he notices more than 3 pounds of weight gain over a week he is asked to take Lasix until he is back to his baseline. Echo will be ordered to evaluate for structural heart disease and left ventricular systolic function. Chest x-ray from June 2023 notes small bilateral pleural effusion.  Recent hospitalization records including ED documentation reviewed. At the next visit would like to transition him from [facility] to [facility]  Atherosclerosis of native coronary artery of native heart without angina pectoris / Hx of four-vessel CABG Denies angina pectoris. Continue aspirin and statin therapy Echo will be ordered to evaluate for structural heart disease and left ventricular systolic function.  Permanent atrial fibrillation (HCC) / S/P left atrial appendage ligation / Long term current use of anticoagulant / S/P Maze operation for atrial fibrillation Rate control: Beta-blocker. Rhythm control: N/A (amiodarone in the past) Thromboembolic prophylaxis: Xarelto.  Estimated Creatinine Clearance: 71 mL/min (by C-G formula based on SCr of 1.22 mg/dL). CHA2DS2-VASc SCORE is 4 which correlates to 4% risk of stroke per year (CHF, HTN, vascular disease, age) Does not endorse evidence of bleeding.   Benign hypertension with CKD (chronic kidney disease) stage III ([place]) Office blood pressures are within acceptable range. Medication changes as discussed above. Continue to monitor BP and renal function.  Bilateral carotid artery stenosis: Follows up with vascular surgery.  Upcoming appointment in August 2023.    FINAL MEDICATION LIST END OF ENCOUNTER: Meds ordered this encounter   Medications   dapagliflozin propanediol (FARXIGA) 10 MG  TABS tablet    Sig: Take 1 tablet (10 mg total) by mouth daily before breakfast.    Dispense:  90 tablet    Refill:  0   furosemide (LASIX) 20 MG tablet    Sig: Take 1 tablet (20 mg total) by mouth daily as needed for up to 30 doses.    Dispense:  30 tablet    Refill:  0     Current Outpatient Medications:    Aspirin 81 MG CAPS, Take 81 mg by mouth daily., Disp: 30 capsule, Rfl: 6   dapagliflozin propanediol (FARXIGA) 10 MG TABS tablet, Take 1 tablet (10 mg total) by mouth daily before breakfast., Disp: 90 tablet, Rfl: 0   levalbuterol (XOPENEX HFA) 45 MCG/ACT inhaler, Inhale 1 puff into the lungs every 6 (six) hours as needed for wheezing or shortness of breath (or cough)., Disp: 1 each, Rfl: 12   metoprolol tartrate (LOPRESSOR) 25 MG tablet, Take 0.5 tablets (12.5 mg total) by mouth daily., Disp: 15 tablet, Rfl: 2   olmesartan (BENICAR) 5 MG tablet, TAKE 2 TABLETS BY MOUTH  DAILY (Patient taking differently: Take 5 mg by mouth daily.), Disp: 180 tablet, Rfl: 3   rosuvastatin (CRESTOR) 20 MG tablet, TAKE 1 TABLET BY MOUTH DAILY AT  BEDTIME, Disp: 100 tablet, Rfl: 2   sertraline (ZOLOFT) 25 MG tablet, Take 25 mg by mouth daily., Disp: , Rfl:    XARELTO 20 MG TABS tablet, TAKE 1 TABLET BY MOUTH IN THE  EVENING AFTER DINNER, Disp: 70 tablet, Rfl: 4   furosemide (LASIX) 20 MG tablet, Take 1 tablet (20 mg total) by mouth daily as needed for up to 30 doses., Disp: 30 tablet, Rfl: 0  Orders Placed This Encounter  Procedures   Pro b natriuretic peptide (BNP)   Basic metabolic panel   Magnesium   EKG 12-Lead   PCV ECHOCARDIOGRAM COMPLETE    --Continue cardiac medications as reconciled in final medication list. --Return in about 4 weeks (around 06/11/2022) for Follow up, heart failure management., A. fib. Or sooner if needed. --Continue follow-up with your primary care physician regarding the management of your other chronic comorbid  conditions.  Patient's questions and concerns were addressed to his satisfaction. He voices understanding of the instructions provided during this encounter.   This note was created using a voice recognition software as a result there may be grammatical errors inadvertently enclosed that do not reflect the nature of this encounter. Every attempt is made to correct such errors.   Rex Kras, [place], [facility]  Pager: [phone] Office: [phone]

## 2022-05-18 ENCOUNTER — Other Ambulatory Visit: Payer: Self-pay | Admitting: Cardiology

## 2022-05-19 LAB — BASIC METABOLIC PANEL
BUN/Creatinine Ratio: 16 (ref 10–24)
BUN: 18 mg/dL (ref 8–27)
CO2: 23 mmol/L (ref 20–29)
Calcium: 9.2 mg/dL (ref 8.6–10.2)
Chloride: 98 mmol/L (ref 96–106)
Creatinine, Ser: 1.12 mg/dL (ref 0.76–1.27)
Glucose: 87 mg/dL (ref 70–99)
Potassium: 4.5 mmol/L (ref 3.5–5.2)
Sodium: 135 mmol/L (ref 134–144)
eGFR: 71 mL/min/{1.73_m2} (ref >59)

## 2022-05-19 LAB — MAGNESIUM: Magnesium: 2.3 mg/dL (ref 1.6–2.3)

## 2022-05-19 LAB — PRO B NATRIURETIC PEPTIDE: NT-Pro BNP: 5841 pg/mL — ABNORMAL HIGH (ref 0–376)

## 2022-05-21 ENCOUNTER — Other Ambulatory Visit: Payer: Self-pay | Admitting: Cardiology

## 2022-05-21 DIAGNOSIS — I5042 Chronic combined systolic (congestive) and diastolic (congestive) heart failure: Secondary | ICD-10-CM

## 2022-05-21 NOTE — Progress Notes (Signed)
05/21/2022  Last seen in the office on May 14, 2022 and started him on Farxiga.  Patient has not started [place] yet and pick up samples from the clinic earlier today.  Labs from May 18, 2022 are baseline.  We will order labs 1 week from now to reevaluate his kidneys and NT proBNP after initiating Farxiga.  Patient states that his weight remains relatively stable.  He uses Lasix on a as needed basis.  Tessa Lerner, [place], [facility]  Pager: [phone] Office: [phone]

## 2022-05-25 ENCOUNTER — Other Ambulatory Visit: Payer: Self-pay

## 2022-05-25 DIAGNOSIS — I119 Hypertensive heart disease without heart failure: Secondary | ICD-10-CM

## 2022-05-25 DIAGNOSIS — I5042 Chronic combined systolic (congestive) and diastolic (congestive) heart failure: Secondary | ICD-10-CM

## 2022-05-25 MED ORDER — RIVAROXABAN 20 MG PO TABS: 20.0000 mg | ORAL_TABLET | Freq: Every day | ORAL | 3 refills | Status: DC

## 2022-05-25 MED ORDER — DAPAGLIFLOZIN PROPANEDIOL 10 MG PO TABS: 10.0000 mg | ORAL_TABLET | Freq: Every day | ORAL | 3 refills | Status: DC

## 2022-06-07 NOTE — Progress Notes (Unsigned)
[facility] [place]  ASSESSMENT / PLAN: [name] Rice is a 71 y.o. male with asymptomatic bilateral 60 - 79 % carotid artery stenosis. Duplex today is discordant with CT angiogram. Duplex shows 40-59% stenosis.  The patient should continue best medical therapy for carotid artery stenosis including: Complete cessation from all tobacco products. Blood glucose control with goal A1c < 7%. Blood pressure control with goal blood pressure < 140/90 mmHg. Lipid reduction therapy with goal LDL-C <100 mg/dL (<16 if symptomatic from carotid artery stenosis).  Aspirin 81mg  PO QD.  Atorvastatin 40-80mg  PO QD (or other "high intensity" statin therapy).  Duplex discordant with CT angiogram. He still strongly desires against intervention, which I think makes sense for asymptomatic disease. Will see him again in 1 year with carotid duplex.   CHIEF COMPLAINT: Asymptomatic carotid artery stenosis  HISTORY OF PRESENT ILLNESS: [name] Rice is a 71 y.o. male referred to clinic for evaluation of bilateral asymptomatic carotid artery stenosis.  The patient has had a difficult 15 months: He underwent biatrial maze and four-vessel CABG 07/29/2020 with Dr. 07/31/2020.  This was followed by laparoscopic subtotal cholecystectomy 09/18/2020 for gangrenous cholecystitis.  Then underwent a robotic VATS decortication of a loculated left pleural effusion with trapped lung with Dr. 13/09/2020 on 07/01/2021.  He is recovering now, and is starting to feel better.  He has no symptoms referable to his carotid arteries.  He has never had a stroke or TIA.  He has no focal symptoms like amaurosis, facial droop, difficulty speaking, difficulty swallowing, unilateral weakness or numbness.  06/08/22: Patient returns to clinic for carotid artery surveillance.  He is asymptomatic from a carotid artery perspective.  He reports no TIAs or strokes in the last several months.  Past Medical History:  Diagnosis Date    Atrial fibrillation (HCC)    Carotid artery stenosis    CHF (congestive heart failure) (HCC)    Coronary artery disease    Dysrhythmia    Hyperlipidemia    Hypertension    LBBB (left bundle branch block)     Past Surgical History:  Procedure Laterality Date   CARDIOVERSION N/A 09/28/2016   Procedure: CARDIOVERSION;  Surgeon: 09/30/2016, MD;  Location: [facility] ENDOSCOPY;  Service: Cardiovascular;  Laterality: N/A;   CATARACT EXTRACTION, BILATERAL     CHEST TUBE INSERTION N/A 06/24/2021   Procedure: CHEST TUBE INSERTION;  Surgeon: 06/26/2021, MD;  Location: [facility] ENDOSCOPY;  Service: Pulmonary;  Laterality: N/A;   CHOLECYSTECTOMY N/A 09/18/2020   Procedure: LAPAROSCOPIC PARTIAL CHOLECYSTECTOMY;  Surgeon: 13/09/2020, MD;  Location: [facility] ORS;  Service: General;  Laterality: N/A;   CLIPPING OF ATRIAL APPENDAGE Left 07/29/2020   Procedure: CLIPPING OF ATRIAL APPENDAGE;  Surgeon: 07/31/2020, MD;  Location: [facility] OR;  Service: Open Heart Surgery;  Laterality: Left;   CORONARY ARTERY BYPASS GRAFT N/A 07/29/2020   Procedure: CORONARY ARTERY BYPASS GRAFTING (CABG) TIMES FOUR USING BILATERAL INTERNAL MAMMARIES AND LEFT RADIAL ARTERY;  Surgeon: 07/31/2020, MD;  Location: [facility] OR;  Service: Open Heart Surgery;  Laterality: N/A;   ERCP N/A 09/17/2020   Procedure: ENDOSCOPIC RETROGRADE CHOLANGIOPANCREATOGRAPHY (ERCP);  Surgeon: 13/08/2020, MD;  [facility];  Service: Endoscopy;  Laterality: N/A;   IR THORACENTESIS ASP PLEURAL SPACE W/IMG GUIDE  06/23/2021   LEFT HEART CATH AND CORONARY ANGIOGRAPHY N/A 07/25/2020   Procedure: LEFT HEART CATH AND CORONARY ANGIOGRAPHY;  Surgeon: 07/27/2020, MD;  Location: [facility] INVASIVE CV LAB;  Service: Cardiovascular;  Laterality: N/A;   MAZE N/A 07/29/2020   Procedure: MAZE;  Surgeon: Linden Dolin, MD;  Location: [facility] OR;  Service: Open Heart Surgery;  Laterality: N/A;   RADIAL ARTERY HARVEST Left 07/29/2020   Procedure: RADIAL ARTERY HARVEST;   Surgeon: Linden Dolin, MD;  Location: [facility] OR;  Service: Open Heart Surgery;  Laterality: Left;   REMOVAL OF STONES  09/17/2020   Procedure: REMOVAL OF STONES;  Surgeon: [facility], MD;  Location: [facility] ENDOSCOPY;  Service: Endoscopy;;   SPHINCTEROTOMY  09/17/2020   Procedure: Dennison Mascot;  Surgeon: [facility], MD;  Location: [facility] ENDOSCOPY;  Service: Endoscopy;;   TEE WITHOUT CARDIOVERSION N/A 07/29/2020   Procedure: TRANSESOPHAGEAL ECHOCARDIOGRAM (TEE);  Surgeon: Linden Dolin, MD;  Location: [facility] OR;  Service: Open Heart Surgery;  Laterality: N/A;    Family History  Problem Relation Age of Onset   Heart disease Mother    Hyperlipidemia Mother    Heart disease Father    Hypertension Other     Social History   Socioeconomic History   Marital status: Single    Spouse name: Not on file   Number of children: 0   Years of education: Not on file   Highest education level: Not on file  Occupational History   Occupation: retired  Tobacco Use   Smoking status: Never   Smokeless tobacco: Never  Vaping Use   Vaping Use: Never used  Substance and Sexual Activity   Alcohol use: No   Drug use: No   Sexual activity: Not on file  Other Topics Concern   Not on file  Social History Narrative   Not on file   Social Determinants of Health   Financial Resource Strain: Not on file  Food Insecurity: Not on file  Transportation Needs: Not on file  Physical Activity: Not on file  Stress: Not on file  Social Connections: Not on file  Intimate Partner Violence: Not on file    No Known Allergies  Current Outpatient Medications  Medication Sig Dispense Refill   Aspirin 81 MG CAPS Take 81 mg by mouth daily. 30 capsule 6   dapagliflozin propanediol (FARXIGA) 10 MG TABS tablet Take 1 tablet (10 mg total) by mouth daily before breakfast. 90 tablet 3   furosemide (LASIX) 20 MG tablet Take 1 tablet (20 mg total) by mouth daily as needed for up to 30 doses. 30 tablet 0    levalbuterol (XOPENEX HFA) 45 MCG/ACT inhaler Inhale 1 puff into the lungs every 6 (six) hours as needed for wheezing or shortness of breath (or cough). 1 each 12   olmesartan (BENICAR) 5 MG tablet TAKE 2 TABLETS BY MOUTH  DAILY (Patient taking differently: Take 5 mg by mouth daily.) 180 tablet 3   rivaroxaban (XARELTO) 20 MG TABS tablet Take 1 tablet (20 mg total) by mouth daily with supper. 90 tablet 3   rosuvastatin (CRESTOR) 20 MG tablet TAKE 1 TABLET BY MOUTH DAILY AT  BEDTIME 100 tablet 2   sertraline (ZOLOFT) 25 MG tablet Take 25 mg by mouth daily.     metoprolol tartrate (LOPRESSOR) 25 MG tablet Take 0.5 tablets (12.5 mg total) by mouth daily. 15 tablet 2   No current facility-administered medications for this visit.    PHYSICAL EXAM Vitals:   06/08/22 0943  BP: (!) 152/78  Pulse: (!) 52  Resp: 20  Temp: 99.7 F (37.6 C)  SpO2: 100%  Weight: 245 lb (111.1 kg)  Height: 6\' 1"  (1.854 m)  Constitutional: Chronically ill appearing. no distress. Appears marginally nourished.  Neurologic: CN intact. no focal findings. no sensory loss. Psychiatric:  Mood and affect symmetric and appropriate. Eyes:  No icterus. No conjunctival pallor. Ears, nose, throat:  mucous membranes moist. Midline trachea.  Cardiac: Regular rate and rhythm.  Respiratory:  unlabored. Abdominal:  soft, non-tender, non-distended.  Extremity: no edema. no cyanosis. no pallor.  Skin: no gangrene. no ulceration.  Lymphatic: no Stemmer's sign. no palpable lymphadenopathy.  PERTINENT LABORATORY AND RADIOLOGIC DATA  Most recent CBC    Latest Ref Rng & Units 04/29/2022    1:19 PM 01/04/2022    2:28 AM 07/05/2021   12:48 AM  CBC  WBC 4.0 - 10.5 K/uL 6.6  8.2  6.9   Hemoglobin 13.0 - 17.0 g/dL 83.1  51.7  8.5   Hematocrit 39.0 - 52.0 % 39.1  43.0  28.5   Platelets 150 - 400 K/uL 242  278  400      Most recent CMP    Latest Ref Rng & Units 05/18/2022    1:53 PM 05/12/2022   10:10 AM 04/29/2022    1:19 PM   CMP  Glucose 70 - 99 mg/dL 87  57  616   BUN 8 - 27 mg/dL 18  19  22    Creatinine 0.76 - 1.27 mg/dL  0.73  7.10   Sodium 134 - 144 mmol/L 135  137  135   Potassium 3.5 - 5.2 mmol/L 4.5  4.6  4.3   Chloride 96 - 106 mmol/L 98  99  103   CO2 20 - 29 mmol/L 23  25  24    Calcium 8.6 - 10.2 mg/dL 9.2  9.2  8.5     Renal function Estimated Creatinine Clearance: 81.4 mL/min (by C-G formula based on SCr of 1.12 mg/dL).  Hgb A1c MFr Bld (%)  Date Value  07/26/2020 5.2    LDL Chol Calc (NIH)  Date Value Ref Range Status  01/20/2021 74 0 - 99 mg/dL Final   LDL Direct  Date Value Ref Range Status  01/20/2021 71 0 - 99 mg/dL Final    Carotid duplex  Right Carotid: 40-59% ICA stenosis by end diastolic velocity. ICA/CCA  ratio 5.4.                 Stenosis appeart greater than 40-59% by color Doppler and  plaque                 morphology.   Left Carotid: 40-59% ICA stenosis by end diastolic velocity. ICA/CCA ratio  4.5.   Vertebrals:  Bilateral vertebral arteries demonstrate antegrade flow.  Subclavians: Normal flow hemodynamics were seen in bilateral subclavian               arteries.    01/22/2021. 01/22/2021, MD [facility] Phone Number: [phone] 06/08/2022 12:11 PM  Total time spent on preparing this encounter including chart review, data review, collecting history, examining the patient, coordinating care for this established patient, 30 minutes.   Portions of this report may have been transcribed using voice recognition software.  Every effort has been made to ensure accuracy; however, inadvertent computerized transcription errors may still be present.

## 2022-06-08 ENCOUNTER — Ambulatory Visit: Payer: Medicare Other | Admitting: Vascular Surgery

## 2022-06-08 ENCOUNTER — Ambulatory Visit (HOSPITAL_COMMUNITY)
Admission: RE | Admit: 2022-06-08 | Discharge: 2022-06-08 | Disposition: A | Discharge status: Home / Self Care | Payer: Medicare Other | Source: Ambulatory Visit | Attending: Vascular Surgery | Admitting: Vascular Surgery

## 2022-06-08 ENCOUNTER — Encounter: Payer: Self-pay | Admitting: Vascular Surgery

## 2022-06-08 VITALS — BP 152/78 | HR 52 | Temp 99.7°F | Resp 20 | Ht 73.0 in | Wt 245.0 lb

## 2022-06-08 DIAGNOSIS — I6523 Occlusion and stenosis of bilateral carotid arteries: Secondary | ICD-10-CM | POA: Diagnosis not present

## 2022-06-10 ENCOUNTER — Other Ambulatory Visit: Payer: Self-pay | Admitting: Cardiology

## 2022-06-10 ENCOUNTER — Other Ambulatory Visit: Payer: Self-pay

## 2022-06-10 DIAGNOSIS — I6523 Occlusion and stenosis of bilateral carotid arteries: Secondary | ICD-10-CM

## 2022-06-10 DIAGNOSIS — I2510 Atherosclerotic heart disease of native coronary artery without angina pectoris: Secondary | ICD-10-CM

## 2022-06-10 LAB — BASIC METABOLIC PANEL
BUN/Creatinine Ratio: 15 (ref 10–24)
BUN: 19 mg/dL (ref 8–27)
CO2: 20 mmol/L (ref 20–29)
Calcium: 9 mg/dL (ref 8.6–10.2)
Chloride: 99 mmol/L (ref 96–106)
Creatinine, Ser: 1.26 mg/dL (ref 0.76–1.27)
Glucose: 72 mg/dL (ref 70–99)
Potassium: 5.5 mmol/L — ABNORMAL HIGH (ref 3.5–5.2)
Sodium: 136 mmol/L (ref 134–144)
eGFR: 62 mL/min/{1.73_m2} (ref >59)

## 2022-06-10 LAB — MAGNESIUM: Magnesium: 2.3 mg/dL (ref 1.6–2.3)

## 2022-06-10 LAB — PRO B NATRIURETIC PEPTIDE: NT-Pro BNP: 7120 pg/mL — ABNORMAL HIGH (ref 0–376)

## 2022-06-13 ENCOUNTER — Other Ambulatory Visit: Payer: Self-pay | Admitting: Cardiology

## 2022-06-13 DIAGNOSIS — I5042 Chronic combined systolic (congestive) and diastolic (congestive) heart failure: Secondary | ICD-10-CM

## 2022-06-13 MED ORDER — FUROSEMIDE 20 MG PO TABS: 20.0000 mg | ORAL_TABLET | Freq: Every day | ORAL | 0 refills | Status: DC

## 2022-06-23 ENCOUNTER — Ambulatory Visit: Payer: Medicare Other

## 2022-06-23 ENCOUNTER — Other Ambulatory Visit: Payer: Self-pay

## 2022-06-23 DIAGNOSIS — I5042 Chronic combined systolic (congestive) and diastolic (congestive) heart failure: Secondary | ICD-10-CM

## 2022-06-24 LAB — MAGNESIUM: Magnesium: 2.4 mg/dL — ABNORMAL HIGH (ref 1.6–2.3)

## 2022-06-24 LAB — BASIC METABOLIC PANEL
BUN/Creatinine Ratio: 18 (ref 10–24)
BUN: 24 mg/dL (ref 8–27)
CO2: 23 mmol/L (ref 20–29)
Calcium: 9.5 mg/dL (ref 8.6–10.2)
Chloride: 103 mmol/L (ref 96–106)
Creatinine, Ser: 1.3 mg/dL — ABNORMAL HIGH (ref 0.76–1.27)
Glucose: 87 mg/dL (ref 70–99)
Potassium: 5.1 mmol/L (ref 3.5–5.2)
Sodium: 141 mmol/L (ref 134–144)
eGFR: 59 mL/min/{1.73_m2} — ABNORMAL LOW (ref >59)

## 2022-06-24 LAB — PRO B NATRIURETIC PEPTIDE: NT-Pro BNP: 7015 pg/mL — ABNORMAL HIGH (ref 0–376)

## 2022-06-28 ENCOUNTER — Ambulatory Visit: Payer: Medicare Other | Admitting: Cardiology

## 2022-06-28 ENCOUNTER — Encounter: Payer: Self-pay | Admitting: Cardiology

## 2022-06-28 VITALS — BP 164/83 | HR 53 | Temp 97.8°F | Resp 16 | Ht 73.0 in | Wt 249.0 lb

## 2022-06-28 DIAGNOSIS — E7800 Pure hypercholesterolemia, unspecified: Secondary | ICD-10-CM

## 2022-06-28 DIAGNOSIS — I4821 Permanent atrial fibrillation: Secondary | ICD-10-CM

## 2022-06-28 DIAGNOSIS — Z951 Presence of aortocoronary bypass graft: Secondary | ICD-10-CM

## 2022-06-28 DIAGNOSIS — Z9889 Other specified postprocedural states: Secondary | ICD-10-CM

## 2022-06-28 DIAGNOSIS — Z7901 Long term (current) use of anticoagulants: Secondary | ICD-10-CM

## 2022-06-28 DIAGNOSIS — I447 Left bundle-branch block, unspecified: Secondary | ICD-10-CM

## 2022-06-28 DIAGNOSIS — Z8679 Personal history of other diseases of the circulatory system: Secondary | ICD-10-CM

## 2022-06-28 DIAGNOSIS — I2510 Atherosclerotic heart disease of native coronary artery without angina pectoris: Secondary | ICD-10-CM

## 2022-06-28 DIAGNOSIS — I5042 Chronic combined systolic (congestive) and diastolic (congestive) heart failure: Secondary | ICD-10-CM

## 2022-06-28 MED ORDER — TORSEMIDE 20 MG PO TABS: 20.0000 mg | ORAL_TABLET | Freq: Every morning | ORAL | 0 refills | Status: DC

## 2022-06-28 NOTE — Progress Notes (Signed)
[name] Rice Date of Birth: [birth year] MRN: [id number] Primary Care Provider:Pcp, No Former Cardiology Providers: Dr. Marthenia Rolling, APRN, FNP-C Primary Cardiologist: Rex Kras, DO, [facility] (established care 01/14/2020)  Date: 06/28/22 Last Office Visit: 05/14/2022  Chief Complaint  Patient presents with   Congestive Heart Failure   HPI  [name] Rice is a 72 y.o.  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  Patient went to the ED in June 2023 for shortness of breath and since then has NT proBNP has been elevated and symptoms consistent with heart failure.  Since last office visit he had an echocardiogram which notes improvement in LVEF and findings to suggest HFpEF.  Patient has done well on Farxiga but unable to afford it due to it being cost prohibitive.  Patient is currently approved for Farxiga with patient assistance and awaiting medication delivery.  Most recent labs from August 16th, 2023 independently reviewed.  Patient has been enrolled into principal care management for heart failure and blood pressure and weight log reviewed.  Patient is SBP around 145 mmHg and weight around 245 pounds.   Patient denies orthopnea, paroxysmal nocturnal dyspnea or lower extremity swelling.  But does have shortness of breath with over exertional activities.  Since the initiation of Lasix and Wilder Glade he is doing well.  ALLERGIES: No Known Allergies  MEDICATION LIST PRIOR  TO VISIT: Current Outpatient Medications on File Prior to Visit  Medication Sig Dispense Refill   Aspirin 81 MG CAPS Take 81 mg by mouth daily. 30 capsule 6   dapagliflozin propanediol (FARXIGA) 10 MG TABS tablet Take 1 tablet (10 mg total) by mouth daily before breakfast. 90 tablet 3   metoprolol tartrate (LOPRESSOR) 25 MG tablet TAKE ONE-HALF TABLET BY  MOUTH DAILY 50 tablet 0   olmesartan (BENICAR) 5 MG tablet TAKE 2 TABLETS BY MOUTH  DAILY (Patient taking differently: Take 10 mg by mouth daily.) 180 tablet 3   rivaroxaban (XARELTO) 20 MG TABS tablet Take 1 tablet (20 mg total) by mouth daily with supper. 90 tablet 3   rosuvastatin (CRESTOR) 20 MG tablet TAKE 1 TABLET BY MOUTH DAILY AT  BEDTIME 100 tablet 2   No current facility-administered medications on file prior to visit.    PAST MEDICAL HISTORY: Past Medical History:  Diagnosis Date   Atrial fibrillation ([place])    Carotid artery stenosis    CHF (congestive heart failure) (HCC)    Coronary artery disease    Dysrhythmia    Hyperlipidemia    Hypertension    LBBB (left bundle branch block)     PAST SURGICAL HISTORY: Past Surgical History:  Procedure Laterality Date   CARDIOVERSION N/A 09/28/2016   Procedure: CARDIOVERSION;  Surgeon: Adrian Prows, MD;  Location: [place];  Service: Cardiovascular;  Laterality: N/A;   CATARACT EXTRACTION, BILATERAL     CHEST TUBE INSERTION N/A 06/24/2021   Procedure: CHEST TUBE INSERTION;  Surgeon: Freddi Starr, MD;  Location: [facility] ENDOSCOPY;  Service: Pulmonary;  Laterality: N/A;   CHOLECYSTECTOMY N/A 09/18/2020   Procedure: LAPAROSCOPIC PARTIAL CHOLECYSTECTOMY;  Surgeon: Alphonsa Overall, MD;  Location: [facility];  Service: General;  Laterality: N/A;   CLIPPING OF ATRIAL APPENDAGE Left 07/29/2020   Procedure: CLIPPING OF ATRIAL APPENDAGE;  Surgeon: Wonda Olds, MD;  Location: [place];  Service: Open Heart Surgery;  Laterality: Left;   CORONARY ARTERY BYPASS GRAFT N/A 07/29/2020   Procedure:  CORONARY ARTERY BYPASS GRAFTING (CABG) TIMES FOUR USING BILATERAL INTERNAL MAMMARIES AND LEFT RADIAL ARTERY;  Surgeon: Wonda Olds, MD;  Location: [place];  Service: Open Heart Surgery;  Laterality: N/A;   ERCP N/A 09/17/2020   Procedure: ENDOSCOPIC RETROGRADE CHOLANGIOPANCREATOGRAPHY (ERCP);  Surgeon: Ladene Artist, MD;  Location: [facility];  Service: Endoscopy;  Laterality: N/A;   IR THORACENTESIS ASP PLEURAL SPACE W/IMG GUIDE  06/23/2021   LEFT HEART CATH AND CORONARY ANGIOGRAPHY N/A 07/25/2020   Procedure: LEFT HEART CATH AND CORONARY ANGIOGRAPHY;  Surgeon: Adrian Prows, MD;  Location: [place] [facility];  Service: Cardiovascular;  Laterality: N/A;   MAZE N/A 07/29/2020   Procedure: MAZE;  Surgeon: Wonda Olds, MD;  Location: [place];  Service: Open Heart Surgery;  Laterality: N/A;   RADIAL ARTERY HARVEST Left 07/29/2020   Procedure: RADIAL ARTERY HARVEST;  Surgeon: Wonda Olds, MD;  Location: [place];  Service: Open Heart Surgery;  Laterality: Left;   REMOVAL OF STONES  09/17/2020   Procedure: REMOVAL OF STONES;  Surgeon: Ladene Artist, MD;  Location: [facility];  Service: Endoscopy;;   SPHINCTEROTOMY  09/17/2020   Procedure: Joan Mayans;  Surgeon: Ladene Artist, MD;  Location: [facility];  Service: Endoscopy;;   TEE WITHOUT CARDIOVERSION N/A 07/29/2020   Procedure: TRANSESOPHAGEAL ECHOCARDIOGRAM (TEE);  Surgeon: Wonda Olds, MD;  Location: [place];  Service: Open Heart Surgery;  Laterality: N/A;    FAMILY HISTORY: The patient's family history includes Heart disease in his father and mother; Hyperlipidemia in his mother; Hypertension in an other family member.   SOCIAL HISTORY:  The patient  reports that he has never smoked. He has never used smokeless tobacco. He reports that he does not drink alcohol and does not use drugs.  Review of Systems  Constitutional: Negative for chills and fever.  HENT:  Negative for hoarse voice and nosebleeds.   Eyes:   Negative for discharge, double vision and pain.  Cardiovascular:  Negative for chest pain, claudication, dyspnea on exertion, leg swelling, near-syncope, orthopnea, palpitations, paroxysmal nocturnal dyspnea and syncope.  Respiratory:  Negative for cough, hemoptysis and shortness of breath.   Musculoskeletal:  Negative for muscle cramps and myalgias.  Gastrointestinal:  Negative for abdominal pain, constipation, diarrhea, hematemesis, hematochezia, melena, nausea and vomiting.  Neurological:  Negative for dizziness and light-headedness.     PHYSICAL EXAM:    06/28/2022    2:43 PM 06/08/2022    9:43 AM 05/14/2022    3:19 PM  Vitals with BMI  Height 6\' 1"  6\' 1"  6\' 1"   Weight 249 lbs 245 lbs 220 lbs  BMI 32.86 [id number]  Systolic [id number]  Diastolic 83 78 53  Pulse 53 52 59   CONSTITUTIONAL: Appears older than stated age, hemodynamically stable, no acute distress.   SKIN: Skin is warm and dry. No rash noted. No cyanosis. No pallor. No jaundice HEAD: Normocephalic and atraumatic.  EYES: No scleral icterus MOUTH/THROAT: Moist oral membranes.  Poor oral dentition. NECK: No JVD present. No thyromegaly noted.  Mild bilateral carotid bruits  CHEST Normal respiratory effort. No intercostal retractions.  Sternotomy site healed well. LUNGS: Clear to auscultation bilaterally.  No wheezes rales  or rhonchi's.   CARDIOVASCULAR: Irregularly irregular, positive S1-S2, no murmurs rubs or gallops appreciated. ABDOMINAL: Soft, nontender, nondistended, positive bowel sounds in all 4 quadrants, no apparent ascites.  EXTREMITIES: No peripheral edema.  Warm to touch. HEMATOLOGIC: No significant bruising. NEUROLOGIC: Oriented to person, place, and time. Nonfocal. Normal muscle tone.  PSYCHIATRIC: Normal mood and affect. Normal behavior. Cooperative   RADIOLOGY: 08/11/2020 CXR:  Persistent left pleural effusion with left base atelectasis. Scattered calcified granulomas noted. Lungs elsewhere clear.  Stable cardiac enlargement with postoperative changes.  Aortic Atherosclerosis  CARDIAC DATABASE: EKG: 05/14/2022: Atrial fibrillation, 54 bpm, LVH, IVCD suggestive of left bundle branch block, consider old anteroseptal and lateral infarct.  Coronary artery bypass grafting: 07/29/2020 (by Dr. Orvan Seen at [facility]):  Left Internal Mammary Artery to Distal Left Anterior Descending Coronary Artery; pedicled RIMA Graft to Posterior Descending Coronary Artery; left radial artery  Graft to Obtuse Marginal Branch of Left Circumflex Coronary Artery and ramus intermedius as a sequenced graft. Bi-atrial Maze procedure and left atrial appendage clipping  Echocardiogram: 12/31/2019: 45-50% with regional wall motion abnormalities.  Please refer to the report for additional details.  09/27/2020: LVEF 45-50% with regional wall motion abnormalities, biatrial enlargement, see report for additional details  06/23/2022: Normal LV systolic function with visual EF 60-65%. Left ventricle cavity is normal in size. Moderate concentric hypertrophy of the left ventricle. Normal global wall motion. Indeterminate diastolic filling pattern, elevated LAP. Calculated EF 62%. Left atrial cavity is severely dilated at 4.7 cm. Right atrial cavity is severely dilated. Right ventricle cavity is moderately dilated. Normal right ventricular function. Structurally normal tricuspid valve.  Moderate tricuspid regurgitation. Mild pulmonary hypertension. RVSP measures 36 mmHg. IVC is dilated with respiratory variation.  Stress Testing:  02/18/2020: No previous exam available for comparison. Lexiscan nuclear stress test performed using 1-day protocol. Stress EKG is non-diagnostic, as this is pharmacological stress test. In addition, rest and stress EKG showed atrial fibrillation with slow ventricular response, anterolateral T wave inversion. Stress LVEF 77%. Normal myocardial perfusion. Low risk study.  Heart  Catheterization: 07/25/20:  RCA: Proximal RCA 80% stenosis, large vessel with mild disease in PL and PDA branches.  A secondary PL branch is subtotally occluded. LM: Distal 30-40% stenosis. LAD: LAD diffusely diseased, proximal diffuse 80% followed by tandem 90% stenosis.  Large D1 with moderate diffuse disease and proximal and mid tandem 70 and 80% stenosis.  Has secondary branches and is tortuous. Cx  and RI: Co-dominant Cx with Ostial circumflex 99% involving a moderate sized ramus with ostial 80 to 90% and proximal 90% stenosis. LV: Normal LVEDP.  No pressure gradient across the aortic valve. Patent LIMA and RIMA and RIMA has ostial 20-30% stenosis. Subclavian arteries widely patent.  Right radial diffusely disease by [place]. 33mL contrast used.   Carotid duplex: 07/27/2020:  Right Carotid: Velocities in the right ICA are consistent with a 1-39% stenosis.  Left Carotid: Velocities in the left ICA are consistent with a 60-79% stenosis.  Vertebrals: Bilateral vertebral arteries demonstrate antegrade flow.   08/12/2021: Duplex suggests stenosis in the right internal carotid artery (>=70%).  Duplex suggests stenosis in the right external carotid artery (<50%). Duplex suggests stenosis in the left internal carotid artery (>=70%).  Duplex suggests stenosis in the left external carotid artery (<50%). The PSV internal/common carotid artery ratio is 6.49 on the right and 4.52 on the left consistent with a stenosis of >70% bilaterally. Antegrade left vertebral artery flow. No significant change compared to 01/13/2021. Follow up in six  months is appropriate if clinically indicated.  14 day extended Holter monitor: Dominant rhythm atrial fibrillation (HR between 25-137bpm, avg. 59bpm).  Overall HR 25-197 bpm.  Avg HR 59 bpm. 1519 pauses that were 3 secs or longer.  The longest pause 9.5 sec at 12:36 AM (07/04/2020), followed by 9.4 sec pause at 4:30 AM (07/11/2020), third longest pause 8.9 sec  (07/08/2020).  These are auto-triggered events. 20 episodes of NSVT reported.  Longest episode 20 beats in duration for 10.5 seconds at an average rate 116 bpm.  The fastest episode was 6 beats in duration at average rate of 170 bpm.  Total ventricular ectopic burden <1%. Patient activated events: 0.  LABORATORY DATA:    Latest Ref Rng & Units 04/29/2022    1:19 PM 01/04/2022    2:28 AM 07/05/2021   12:48 AM  CBC  WBC 4.0 - 10.5 K/uL 6.6  8.2  6.9   Hemoglobin 13.0 - 17.0 g/dL 10.9  32.3  8.5   Hematocrit 39.0 - 52.0 % 39.1  43.0  28.5   Platelets 150 - 400 K/uL 242  278  400        Latest Ref Rng & Units 06/23/2022   11:52 AM 06/08/2022   10:39 AM 05/18/2022    1:53 PM  CMP  Glucose 70 - 99 mg/dL 87  72  87   BUN 8 - 27 mg/dL 24  19  18    Creatinine 0.76 - 1.27 mg/dL  5.57  3.22   Sodium 134 - 144 mmol/L 141  136  135   Potassium 3.5 - 5.2 mmol/L 5.1  5.5  4.5   Chloride 96 - 106 mmol/L 103  99  98   CO2 20 - 29 mmol/L 23  20  23    Calcium 8.6 - 10.2 mg/dL 9.5  9.0  9.2     Lipid Panel  Lab Results  Component Value Date   CHOL 132 01/20/2021   HDL 40 01/20/2021   LDLCALC 74 01/20/2021   LDLDIRECT 71 01/20/2021   TRIG 93 01/20/2021   CHOLHDL 3.3 07/26/2020     Lab Results  Component Value Date   HGBA1C 5.2 07/26/2020   No components found for: "NTPROBNP" Lab Results  Component Value Date   TSH 1.610 11/13/2020   TSH 1.083 09/16/2020   TSH 0.941 07/24/2020    Cardiac Panel (last 3 results) No results for input(s): "CKTOTAL", "CKMB", "TROPONINIHS", "RELINDX" in the last 72 hours.   IMPRESSION:    ICD-10-CM   1. Chronic combined systolic and diastolic heart failure (HCC)  13/07/2020 torsemide (DEMADEX) 20 MG tablet    Pro b natriuretic peptide (BNP)    Basic metabolic panel    Magnesium    Magnesium    Basic metabolic panel    Pro b natriuretic peptide (BNP)    2. Hx of CABG  Z95.1     3. Permanent atrial fibrillation (HCC)  I48.21     4. S/P left  atrial appendage ligation  Z98.890     5. Long term current use of anticoagulant  Z79.01     6. S/P Maze operation for atrial fibrillation  Z98.890    Z86.79     7. Left bundle branch block  I44.7     8. Atherosclerosis of native coronary artery of native heart without angina pectoris  I25.10     9. Pure hypercholesterolemia  E78.00         RECOMMENDATIONS: [name] Rice is  a 71 y.o. 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  Chronic combined systolic and diastolic heart failure (HCC) Stage C, NYHA class II/III.  We will transition from Lasix to torsemide 20 mg p.o. daily. Patient is awaiting Farxiga 10 mg p.o. daily to be delivered and is approved for patient assistance. Once he is on torsemide and Farxiga for 1 week he is asked to get labs. Physical care management data reviewed including blood pressure and weight log findings noted above. We will start the patient assistance forms for [facility] and if and when approved we will transition him from olmesartan to [facility] as well (if renal function and hemodynamics allow). Outside labs 06/23/2022 independently reviewed and noted above for further reference.  Atherosclerosis of native coronary artery of native heart without angina pectoris / Hx of four-vessel CABG Denies angina pectoris. Continue aspirin and statin therapy Echocardiogram notes improvement in LVEF study independently reviewed and noted above  for reference  Permanent atrial fibrillation ([place]) / S/P left atrial appendage ligation / Long term current use of anticoagulant / S/P Maze operation for atrial fibrillation Rate control: Beta-blocker. Rhythm control: N/A (amiodarone in the past) Thromboembolic prophylaxis: Xarelto.  Estimated Creatinine Clearance: 70.6 mL/min (A) (by C-G formula based on SCr of 1.3 mg/dL (H)). CHA2DS2-VASc SCORE is 4 which correlates to 4% risk of stroke per year (CHF, HTN, vascular disease, age) Does not endorse evidence of bleeding.  Patient assistance form for Xarelto filled-awaiting decision  Benign hypertension with CKD (chronic kidney disease) stage III ([place]) Office blood pressures are within acceptable range. Medication changes as discussed above. Continue to monitor BP and renal function.  Bilateral carotid artery stenosis: Follows up with vascular surgery.  Upcoming appointment in August 2023.    FINAL MEDICATION LIST END OF ENCOUNTER: Meds ordered this encounter  Medications   torsemide (DEMADEX) 20 MG tablet    Sig: Take 1 tablet (20 mg total) by mouth every morning.    Dispense:  90 tablet    Refill:  0     Current Outpatient Medications:    Aspirin 81 MG CAPS, Take 81 mg by mouth daily., Disp: 30 capsule, Rfl: 6   dapagliflozin propanediol (FARXIGA) 10 MG TABS tablet, Take 1 tablet (10 mg total) by mouth daily before breakfast., Disp: 90 tablet, Rfl: 3   metoprolol tartrate (LOPRESSOR) 25 MG tablet, TAKE ONE-HALF TABLET BY  MOUTH DAILY, Disp: 50 tablet, Rfl: 0   olmesartan (BENICAR) 5 MG tablet, TAKE 2 TABLETS BY MOUTH  DAILY (Patient taking differently: Take 10 mg by mouth daily.), Disp: 180 tablet, Rfl: 3   rivaroxaban (XARELTO) 20 MG TABS tablet, Take 1 tablet (20 mg total) by mouth daily with supper., Disp: 90 tablet, Rfl: 3   rosuvastatin (CRESTOR) 20 MG tablet, TAKE 1 TABLET BY MOUTH DAILY AT  BEDTIME, Disp: 100 tablet, Rfl: 2   torsemide (DEMADEX) 20 MG tablet, Take 1 tablet  (20 mg total) by mouth every morning., Disp: 90 tablet, Rfl: 0  Orders Placed This Encounter  Procedures   Pro b natriuretic peptide (BNP)   Basic metabolic panel   Magnesium    --  Continue cardiac medications as reconciled in final medication list. --Return in about 3 months (around 09/28/2022) for Follow up, heart failure management.. Or sooner if needed. --Continue follow-up with your primary care physician regarding the management of your other chronic comorbid conditions.  Patient's questions and concerns were addressed to his satisfaction. He voices understanding of the instructions provided during this encounter.   This note was created using a voice recognition software as a result there may be grammatical errors inadvertently enclosed that do not reflect the nature of this encounter. Every attempt is made to correct such errors.   Tessa Lerner, [place], [facility]  Pager: [phone] Office: [phone]

## 2022-07-06 ENCOUNTER — Other Ambulatory Visit: Payer: Self-pay

## 2022-07-06 MED ORDER — SACUBITRIL-VALSARTAN 24-26 MG PO TABS: 1.0000 | ORAL_TABLET | Freq: Two times a day (BID) | ORAL | 3 refills | Status: DC

## 2022-07-10 LAB — BASIC METABOLIC PANEL
BUN/Creatinine Ratio: 21 (ref 10–24)
BUN: 34 mg/dL — ABNORMAL HIGH (ref 8–27)
CO2: 23 mmol/L (ref 20–29)
Calcium: 8.7 mg/dL (ref 8.6–10.2)
Chloride: 97 mmol/L (ref 96–106)
Creatinine, Ser: 1.65 mg/dL — ABNORMAL HIGH (ref 0.76–1.27)
Glucose: 85 mg/dL (ref 70–99)
Potassium: 4.4 mmol/L (ref 3.5–5.2)
Sodium: 136 mmol/L (ref 134–144)
eGFR: 45 mL/min/{1.73_m2} — ABNORMAL LOW (ref >59)

## 2022-07-10 LAB — MAGNESIUM: Magnesium: 2.9 mg/dL — ABNORMAL HIGH (ref 1.6–2.3)

## 2022-07-10 LAB — PRO B NATRIURETIC PEPTIDE: NT-Pro BNP: 4649 pg/mL — ABNORMAL HIGH (ref 0–376)

## 2022-07-21 ENCOUNTER — Other Ambulatory Visit: Payer: Self-pay

## 2022-07-21 ENCOUNTER — Telehealth: Payer: Self-pay

## 2022-07-21 DIAGNOSIS — I5022 Chronic systolic (congestive) heart failure: Secondary | ICD-10-CM

## 2022-07-21 NOTE — Telephone Encounter (Signed)
Per Alicia's conversation with Dr.Tolia, patient to decrease torsemide dose from 20 mg daily to 10 mg daily due to renal function slight decline. Patient to get labs completed 1 week at [facility] patient to inform him, states he understands and will start tomorrow.   Patient is applying for patient assistance for Entresto, continue olmesartan until patient assistance is approved. Will continue to monitor and follow up with patient.

## 2022-07-22 NOTE — Telephone Encounter (Signed)
Thank-you.   Dr. Odis Hollingshead

## 2022-08-06 LAB — BASIC METABOLIC PANEL
BUN/Creatinine Ratio: 13 (ref 10–24)
BUN: 18 mg/dL (ref 8–27)
CO2: 22 mmol/L (ref 20–29)
Calcium: 8.7 mg/dL (ref 8.6–10.2)
Chloride: 101 mmol/L (ref 96–106)
Creatinine, Ser: 1.42 mg/dL — ABNORMAL HIGH (ref 0.76–1.27)
Glucose: 94 mg/dL (ref 70–99)
Potassium: 4.5 mmol/L (ref 3.5–5.2)
Sodium: 139 mmol/L (ref 134–144)
eGFR: 53 mL/min/{1.73_m2} — ABNORMAL LOW (ref >59)

## 2022-08-06 LAB — BRAIN NATRIURETIC PEPTIDE: BNP: 654.3 pg/mL — ABNORMAL HIGH (ref 0.0–100.0)

## 2022-08-19 ENCOUNTER — Other Ambulatory Visit: Payer: Self-pay

## 2022-08-19 ENCOUNTER — Other Ambulatory Visit: Payer: Self-pay | Admitting: Cardiology

## 2022-08-19 DIAGNOSIS — I5022 Chronic systolic (congestive) heart failure: Secondary | ICD-10-CM

## 2022-08-19 DIAGNOSIS — I2510 Atherosclerotic heart disease of native coronary artery without angina pectoris: Secondary | ICD-10-CM

## 2022-08-19 NOTE — Progress Notes (Signed)
Entresto approved through patient assistance and shipped to patient's home. Patient aware to stop taking olmesartan.

## 2022-08-26 ENCOUNTER — Other Ambulatory Visit: Payer: Self-pay

## 2022-08-26 DIAGNOSIS — I5022 Chronic systolic (congestive) heart failure: Secondary | ICD-10-CM

## 2022-09-03 LAB — BASIC METABOLIC PANEL
BUN/Creatinine Ratio: 12 (ref 10–24)
BUN: 16 mg/dL (ref 8–27)
CO2: 21 mmol/L (ref 20–29)
Calcium: 8.6 mg/dL (ref 8.6–10.2)
Chloride: 103 mmol/L (ref 96–106)
Creatinine, Ser: 1.3 mg/dL — ABNORMAL HIGH (ref 0.76–1.27)
Glucose: 160 mg/dL — ABNORMAL HIGH (ref 70–99)
Potassium: 4.2 mmol/L (ref 3.5–5.2)
Sodium: 139 mmol/L (ref 134–144)
eGFR: 59 mL/min/{1.73_m2} — ABNORMAL LOW (ref >59)

## 2022-09-03 LAB — PRO B NATRIURETIC PEPTIDE: NT-Pro BNP: 3271 pg/mL — ABNORMAL HIGH (ref 0–376)

## 2022-10-06 ENCOUNTER — Telehealth: Payer: Self-pay | Admitting: Cardiology

## 2022-10-06 ENCOUNTER — Other Ambulatory Visit: Payer: Self-pay

## 2022-10-06 DIAGNOSIS — I119 Hypertensive heart disease without heart failure: Secondary | ICD-10-CM

## 2022-10-06 MED ORDER — RIVAROXABAN 20 MG PO TABS: 20.0000 mg | ORAL_TABLET | Freq: Every day | ORAL | 3 refills | Status: DC

## 2022-10-06 NOTE — Telephone Encounter (Signed)
Patient needs refill for Xarelto. Please do 90 day supply, send to [facility] pharmacy. Phone number is [phone]. He says to please call him if there are any issues.

## 2022-10-06 NOTE — Addendum Note (Signed)
Addended by: Armen Pickup T on: 10/06/2022 12:05 PM   Modules accepted: Orders

## 2022-11-29 ENCOUNTER — Other Ambulatory Visit: Payer: Self-pay | Admitting: Cardiology

## 2022-11-29 DIAGNOSIS — I5042 Chronic combined systolic (congestive) and diastolic (congestive) heart failure: Secondary | ICD-10-CM

## 2022-12-05 ENCOUNTER — Encounter (HOSPITAL_BASED_OUTPATIENT_CLINIC_OR_DEPARTMENT_OTHER): Payer: Self-pay | Admitting: Emergency Medicine

## 2022-12-05 ENCOUNTER — Encounter (HOSPITAL_COMMUNITY): Payer: Self-pay

## 2022-12-05 ENCOUNTER — Inpatient Hospital Stay (HOSPITAL_BASED_OUTPATIENT_CLINIC_OR_DEPARTMENT_OTHER)
Admission: EM | Admit: 2022-12-05 | Discharge: 2022-12-10 | DRG: 286 | Disposition: A | Discharge status: Home / Self Care | Payer: Medicare Other | Attending: Internal Medicine | Admitting: Internal Medicine

## 2022-12-05 ENCOUNTER — Other Ambulatory Visit: Payer: Self-pay

## 2022-12-05 ENCOUNTER — Emergency Department (HOSPITAL_BASED_OUTPATIENT_CLINIC_OR_DEPARTMENT_OTHER): Payer: Medicare Other

## 2022-12-05 DIAGNOSIS — N179 Acute kidney failure, unspecified: Secondary | ICD-10-CM | POA: Diagnosis present

## 2022-12-05 DIAGNOSIS — I6523 Occlusion and stenosis of bilateral carotid arteries: Secondary | ICD-10-CM

## 2022-12-05 DIAGNOSIS — I5033 Acute on chronic diastolic (congestive) heart failure: Secondary | ICD-10-CM | POA: Diagnosis present

## 2022-12-05 DIAGNOSIS — I482 Chronic atrial fibrillation, unspecified: Secondary | ICD-10-CM | POA: Diagnosis present

## 2022-12-05 DIAGNOSIS — N182 Chronic kidney disease, stage 2 (mild): Secondary | ICD-10-CM | POA: Diagnosis present

## 2022-12-05 DIAGNOSIS — Z1152 Encounter for screening for COVID-19: Secondary | ICD-10-CM

## 2022-12-05 DIAGNOSIS — R001 Bradycardia, unspecified: Secondary | ICD-10-CM

## 2022-12-05 DIAGNOSIS — I10 Essential (primary) hypertension: Secondary | ICD-10-CM | POA: Diagnosis present

## 2022-12-05 DIAGNOSIS — I5042 Chronic combined systolic (congestive) and diastolic (congestive) heart failure: Secondary | ICD-10-CM

## 2022-12-05 DIAGNOSIS — I447 Left bundle-branch block, unspecified: Secondary | ICD-10-CM | POA: Diagnosis present

## 2022-12-05 DIAGNOSIS — E876 Hypokalemia: Secondary | ICD-10-CM | POA: Diagnosis present

## 2022-12-05 DIAGNOSIS — I272 Pulmonary hypertension, unspecified: Secondary | ICD-10-CM | POA: Diagnosis present

## 2022-12-05 DIAGNOSIS — I4821 Permanent atrial fibrillation: Secondary | ICD-10-CM | POA: Diagnosis present

## 2022-12-05 DIAGNOSIS — Z7982 Long term (current) use of aspirin: Secondary | ICD-10-CM

## 2022-12-05 DIAGNOSIS — R7989 Other specified abnormal findings of blood chemistry: Secondary | ICD-10-CM

## 2022-12-05 DIAGNOSIS — I472 Ventricular tachycardia, unspecified: Secondary | ICD-10-CM | POA: Diagnosis not present

## 2022-12-05 DIAGNOSIS — Z9889 Other specified postprocedural states: Secondary | ICD-10-CM

## 2022-12-05 DIAGNOSIS — R0602 Shortness of breath: Secondary | ICD-10-CM | POA: Diagnosis not present

## 2022-12-05 DIAGNOSIS — Z83438 Family history of other disorder of lipoprotein metabolism and other lipidemia: Secondary | ICD-10-CM

## 2022-12-05 DIAGNOSIS — I5032 Chronic diastolic (congestive) heart failure: Secondary | ICD-10-CM

## 2022-12-05 DIAGNOSIS — Z8249 Family history of ischemic heart disease and other diseases of the circulatory system: Secondary | ICD-10-CM

## 2022-12-05 DIAGNOSIS — I130 Hypertensive heart and chronic kidney disease with heart failure and stage 1 through stage 4 chronic kidney disease, or unspecified chronic kidney disease: Secondary | ICD-10-CM | POA: Diagnosis not present

## 2022-12-05 DIAGNOSIS — Z79899 Other long term (current) drug therapy: Secondary | ICD-10-CM

## 2022-12-05 DIAGNOSIS — E7800 Pure hypercholesterolemia, unspecified: Secondary | ICD-10-CM | POA: Diagnosis present

## 2022-12-05 DIAGNOSIS — Z951 Presence of aortocoronary bypass graft: Secondary | ICD-10-CM

## 2022-12-05 DIAGNOSIS — Z7901 Long term (current) use of anticoagulants: Secondary | ICD-10-CM

## 2022-12-05 DIAGNOSIS — I2510 Atherosclerotic heart disease of native coronary artery without angina pectoris: Secondary | ICD-10-CM | POA: Diagnosis present

## 2022-12-05 DIAGNOSIS — D649 Anemia, unspecified: Secondary | ICD-10-CM | POA: Diagnosis present

## 2022-12-05 HISTORY — DX: Chronic diastolic (congestive) heart failure: I50.32

## 2022-12-05 LAB — CBC WITH DIFFERENTIAL/PLATELET
Abs Immature Granulocytes: 0.02 10*3/uL (ref 0.00–0.07)
Basophils Absolute: 0.1 10*3/uL (ref 0.0–0.1)
Basophils Relative: 1 %
Eosinophils Absolute: 0.2 10*3/uL (ref 0.0–0.5)
Eosinophils Relative: 3 %
HCT: 33.5 % — ABNORMAL LOW (ref 39.0–52.0)
Hemoglobin: 9.4 g/dL — ABNORMAL LOW (ref 13.0–17.0)
Immature Granulocytes: 0 %
Lymphocytes Relative: 15 %
Lymphs Abs: 0.9 10*3/uL (ref 0.7–4.0)
MCH: 21 pg — ABNORMAL LOW (ref 26.0–34.0)
MCHC: 28.1 g/dL — ABNORMAL LOW (ref 30.0–36.0)
MCV: 74.9 fL — ABNORMAL LOW (ref 80.0–100.0)
Monocytes Absolute: 0.9 10*3/uL (ref 0.1–1.0)
Monocytes Relative: 15 %
Neutro Abs: 4 10*3/uL (ref 1.7–7.7)
Neutrophils Relative %: 66 %
Platelets: 254 10*3/uL (ref 150–400)
RBC: 4.47 MIL/uL (ref 4.22–5.81)
RDW: 18.1 % — ABNORMAL HIGH (ref 11.5–15.5)
WBC: 6 10*3/uL (ref 4.0–10.5)
nRBC: 0 % (ref 0.0–0.2)

## 2022-12-05 LAB — COMPREHENSIVE METABOLIC PANEL
ALT: 7 U/L (ref 0–44)
AST: 18 U/L (ref 15–41)
Albumin: 3.6 g/dL (ref 3.5–5.0)
Alkaline Phosphatase: 113 U/L (ref 38–126)
Anion gap: 8 (ref 5–15)
BUN: 17 mg/dL (ref 8–23)
CO2: 23 mmol/L (ref 22–32)
Calcium: 8.1 mg/dL — ABNORMAL LOW (ref 8.9–10.3)
Chloride: 104 mmol/L (ref 98–111)
Creatinine, Ser: 1.3 mg/dL — ABNORMAL HIGH (ref 0.61–1.24)
GFR, Estimated: 59 mL/min — ABNORMAL LOW (ref >60)
Glucose, Bld: 98 mg/dL (ref 70–99)
Potassium: 3.5 mmol/L (ref 3.5–5.1)
Sodium: 135 mmol/L (ref 135–145)
Total Bilirubin: 2.6 mg/dL — ABNORMAL HIGH (ref 0.3–1.2)
Total Protein: 7.1 g/dL (ref 6.5–8.1)

## 2022-12-05 LAB — TROPONIN I (HIGH SENSITIVITY)
Troponin I (High Sensitivity): 17 ng/L (ref <18)
Troponin I (High Sensitivity): 17 ng/L (ref <18)

## 2022-12-05 LAB — RESP PANEL BY RT-PCR (RSV, FLU A&B, COVID)  RVPGX2
Influenza A by PCR: NEGATIVE
Influenza B by PCR: NEGATIVE
Resp Syncytial Virus by PCR: NEGATIVE
SARS Coronavirus 2 by RT PCR: NEGATIVE

## 2022-12-05 LAB — MAGNESIUM: Magnesium: 2.3 mg/dL (ref 1.7–2.4)

## 2022-12-05 LAB — BRAIN NATRIURETIC PEPTIDE: B Natriuretic Peptide: 1483.3 pg/mL — ABNORMAL HIGH (ref 0.0–100.0)

## 2022-12-05 MED ORDER — RIVAROXABAN 20 MG PO TABS: 20.0000 mg | ORAL_TABLET | Freq: Every day | ORAL | Status: DC

## 2022-12-05 MED ORDER — SACUBITRIL-VALSARTAN 24-26 MG PO TABS
1.0000 | ORAL_TABLET | Freq: Two times a day (BID) | ORAL | Status: DC
  Filled 2022-12-05: qty 1

## 2022-12-05 MED ORDER — FUROSEMIDE 10 MG/ML IJ SOLN
40.0000 mg | Freq: Once | INTRAMUSCULAR | Status: AC
  Administered 2022-12-05: 40 mg via INTRAVENOUS
  Filled 2022-12-05: qty 4

## 2022-12-05 MED ORDER — ASPIRIN 81 MG PO TBEC
81.0000 mg | DELAYED_RELEASE_TABLET | Freq: Every day | ORAL | Status: DC
  Administered 2022-12-06: 81 mg via ORAL
  Filled 2022-12-05: qty 1

## 2022-12-05 MED ORDER — ROSUVASTATIN CALCIUM 20 MG PO TABS
20.0000 mg | ORAL_TABLET | Freq: Every day | ORAL | Status: DC
  Filled 2022-12-05: qty 1

## 2022-12-05 MED ORDER — DAPAGLIFLOZIN PROPANEDIOL 10 MG PO TABS
10.0000 mg | ORAL_TABLET | Freq: Every day | ORAL | Status: DC
  Filled 2022-12-05: qty 1

## 2022-12-05 NOTE — Progress Notes (Signed)
Hospitalist Transfer Note:  Transferring facility: [facility] Requesting provider: Evlyn Courier, PA (EDP at [facility]) Reason for transfer: admission for further evaluation and management of suspected acute on chronic diastolic heart failure as well as bradycardia.    6 M w/ h/o permanent atrial fibrillation, chronic diastolic heart failure, CAD status post CABG,  who presented to [place] ED complaining of 2 weeks of progressive shortness of breath, worse on exertion, associated with worsening orthopnea.  Denies any significant worsening of peripheral edema, and notes no significant recent change in weight.  he is compliant on home torsemide, and follows with Dr. Terri Skains as his outpatient cardiologist.  Also has a history of permanent atrial fibrillation.   The patient conveys that he has been hospitalized on multiple prior occasions for acute on chronic heart failure, and conveys that the above presentation is very similar to that which she was experiencing at times of previous hospitalizations for acutely decompensated heart failure requiring IV diuresis.  Vital signs in the ED were notable for the following: Afebrile; heart rates in the 40s to 60s, increasing into the 90s with exertion; systolic blood pressures in the 130s to 150s; oxygen saturation in the mid to high 90s on room air.   Labs were notable for BNP of 1400, which is reported to be the highest recent BNP data point available for this patient, and relative to most recent prior value of 200.  Hemoglobin reportedly slightly lower than baseline, with the patient denying any recent melena or hematochezia.  Fecal occult blood testing was guaiac negative in the ED today.  Imaging notable for chest x-ray, which shows no evidence of acute process.  EDP discussed with pt's outpatient cardiologist (Dr. Terri Skains), who rec gentle IV diuresis, holding patient's beta-blocker, and conveyed that he will formally consult and see the patient in the  morning.    Subsequently, I accepted this patient for transfer for inpatient admission to a cardiac telemetry bed at [facility] for further work-up and management of the above.      Check [email or web address] for on-call coverage.   Nursing staff, Please call [place] number on Amion as soon as patient's arrival, so appropriate admitting provider can evaluate the pt.     Babs Bertin, DO Hospitalist

## 2022-12-05 NOTE — ED Notes (Signed)
Pt OOB to the bathroom. Pt tolerating well. Given UA cup.

## 2022-12-05 NOTE — ED Triage Notes (Signed)
Pt arrives pov, steady gait with c/o shob and "chest congestion" x 2 weeks. TX at [place] 10 days pta for similar symptoms

## 2022-12-05 NOTE — ED Provider Notes (Signed)
[facility] Provider Note   CSN: [id number] Arrival date & time: 12/05/22  1644     History  Chief Complaint  Patient presents with   Shortness of Breath    [name] Rice is a 72 y.o. male.  72 year old male with past medical history of permanent A-fib, CHF, CAD status post CABG presents today for evaluation of shortness of breath that has been ongoing for the past couple weeks.  He does have exertional dyspnea.  Chronic two-pillow orthopnea.  Denies PND, or peripheral edema.  He has his uncle who is at bedside and assist with history.  The history is provided by the patient. No language interpreter was used.       Home Medications Prior to Admission medications   Medication Sig Start Date End Date Taking? Authorizing Provider  Aspirin 81 MG CAPS Take 81 mg by mouth daily. 07/21/21   Melrose Nakayama, MD  dapagliflozin propanediol (FARXIGA) 10 MG TABS tablet Take 1 tablet (10 mg total) by mouth daily before breakfast. 05/25/22 05/20/23  Tolia, Sunit, DO  metoprolol tartrate (LOPRESSOR) 25 MG tablet TAKE ONE-HALF TABLET BY MOUTH  DAILY 08/19/22   Tolia, Sunit, DO  rivaroxaban (XARELTO) 20 MG TABS tablet Take 1 tablet (20 mg total) by mouth daily with supper. 10/06/22   Tolia, Sunit, DO  rosuvastatin (CRESTOR) 20 MG tablet TAKE 1 TABLET BY MOUTH DAILY AT  BEDTIME 03/08/22   Tolia, Sunit, DO  sacubitril-valsartan (ENTRESTO) 24-26 MG Take 1 tablet by mouth 2 (two) times daily. 07/06/22   Tolia, Sunit, DO  torsemide (DEMADEX) 20 MG tablet TAKE 1 TABLET BY MOUTH EVERY DAY IN THE MORNING 11/30/22   Tolia, Sunit, DO      Allergies    Patient has no known allergies.    Review of Systems   Review of Systems  Constitutional:  Negative for chills and fever.  Respiratory:  Positive for cough and shortness of breath.   Cardiovascular:  Negative for chest pain, palpitations and leg swelling.  Gastrointestinal:  Negative for abdominal pain and  nausea.  Neurological:  Negative for light-headedness.  All other systems reviewed and are negative.   Physical Exam Updated Vital Signs BP 136/76 (BP Location: Right Arm)   Pulse (!) 42   Temp 98.2 F (36.8 C) (Oral)   Resp 20   Wt 111.1 kg   SpO2 97%   BMI 32.32 kg/m  Physical Exam Vitals and nursing note reviewed.  Constitutional:      General: He is not in acute distress.    Appearance: Normal appearance. He is not ill-appearing.  HENT:     Head: Normocephalic and atraumatic.     Nose: Nose normal.  Eyes:     General: No scleral icterus.    Extraocular Movements: Extraocular movements intact.     Conjunctiva/sclera: Conjunctivae normal.  Cardiovascular:     Rate and Rhythm: Bradycardia present. Rhythm irregular.     Pulses: Normal pulses.  Pulmonary:     Effort: Pulmonary effort is normal. No respiratory distress.     Breath sounds: Normal breath sounds. No wheezing.     Comments: Coarse lung sounds throughout. Abdominal:     General: There is no distension.     Palpations: Abdomen is soft.     Tenderness: There is no abdominal tenderness. There is no guarding.  Musculoskeletal:        General: Normal range of motion.     Cervical back: Normal  range of motion.     Right lower leg: No edema.     Left lower leg: No edema.  Skin:    General: Skin is warm and dry.  Neurological:     General: No focal deficit present.     Mental Status: He is alert. Mental status is at baseline.     ED Results / Procedures / Treatments   Labs (all labs ordered are listed, but only abnormal results are displayed) Labs Reviewed  RESP PANEL BY RT-PCR (RSV, FLU A&B, COVID)  RVPGX2  CBC WITH DIFFERENTIAL/PLATELET  COMPREHENSIVE METABOLIC PANEL  MAGNESIUM  BRAIN NATRIURETIC PEPTIDE  TROPONIN I (HIGH SENSITIVITY)    EKG None  Radiology DG Chest Port 1 View  Result Date: 12/05/2022 CLINICAL DATA:  Shortness of breath and chest pain for 2 weeks, initial encounter EXAM:  PORTABLE CHEST 1 VIEW COMPARISON:  12/30/2021 FINDINGS: Cardiac shadow is stable. Postsurgical changes are again seen. Lungs are well aerated bilaterally. Mild scarring in the left base is seen and stable. No bony abnormality is noted. IMPRESSION: No active disease. Electronically Signed   By: Inez Catalina M.D.   On: 12/05/2022 17:39    Procedures Procedures    Medications Ordered in ED Medications - No data to display  ED Course/ Medical Decision Making/ A&P Clinical Course as of 12/05/22 2308  Nancy Fetter Dec 05, 2022  2110 Discussed with Dr. Terri Skains of cardiology who is not convinced he patient is an acute CHF exacerbation given stable weights, negative chest x-ray, and exam without peripheral edema or other signs of volume overload.  He does have significant exertional dyspnea with minimal exertion with ambulation around the bed.  He does have hemoglobin of 9.4 which is decreased from recent values however he has had similar readings in the past.  Will guaiac test.  Dr. Terri Skains states if patient wants to stay in his guaiac negative he can be admitted to medicine and figure out the source of patient's shortness of breath and they can consult.  He states given patient is on Entresto his BNP will be falsely elevated.  He does recommend holding beta-blocker given the bradycardia. [AA]    Clinical Course User Index [AA] Evlyn Courier, PA-C                             Medical Decision Making Amount and/or Complexity of Data Reviewed Labs: ordered. Radiology: ordered.  Risk Prescription drug management. Decision regarding hospitalization.   Medical Decision Making / ED Course   This patient presents to the ED for concern of shortness of breath, this involves an extensive number of treatment options, and is a complaint that carries with it a high risk of complications and morbidity.  The differential diagnosis includes ACS, PE, pneumonia, CHF exacerbation, viral URI, symptomatic bradycardia, GI  bleeding  MDM: 72 year old male with past medical history significant for permanent A-fib on Xarelto, CHF, CAD status post CABG presents today for evaluation of shortness of breath that has been progressively worsening over the past 2 weeks.  Reports significant exertional dyspnea.  Mild conversational dyspnea noted.  Significantly worse when I ambulated patient around the bed.  He also ambulated with nursing staff with pulse oximeter.  He maintained his O2 sats appropriate response to activity with rates of 90s.  He did have some rates of upper 30s and 40s.  He states this is unusually low for him.  Typically his rates are 50-60.  Workup revealed CBC without leukocytosis.  Anemia of 9.4 noted.  Which is somewhat decreased from his recent baseline of 11-12.  He has had similar readings but that was a couple years ago.  Creatinine mildly increased.  Electrolytes normal.  BNP elevated to 1483.  Troponin negative x 2.  Chest x-ray without acute cardiopulmonary process.  EKG without acute ischemic changes.  No suspicion for ACS.  Hemoccult negative on exam.  Brown stools noted.  Discussed with cardiology as noted above and ED course.  Lasix 40 mg provided.  Discussed with hospitalist who will accept patient for admission.   Lab Tests: -I ordered, reviewed, and interpreted labs.   The pertinent results include:   Labs Reviewed  CBC WITH DIFFERENTIAL/PLATELET - Abnormal; Notable for the following components:      Result Value   Hemoglobin 9.4 (*)    HCT 33.5 (*)    MCV 74.9 (*)    MCH 21.0 (*)    MCHC 28.1 (*)    RDW 18.1 (*)    All other components within normal limits  COMPREHENSIVE METABOLIC PANEL - Abnormal; Notable for the following components:   Creatinine, Ser 1.30 (*)    Calcium 8.1 (*)    Total Bilirubin 2.6 (*)    GFR, Estimated 59 (*)    All other components within normal limits  BRAIN NATRIURETIC PEPTIDE - Abnormal; Notable for the following components:   B Natriuretic Peptide  1,483.3 (*)    All other components within normal limits  RESP PANEL BY RT-PCR (RSV, FLU A&B, COVID)  RVPGX2  MAGNESIUM  OCCULT BLOOD X 1 CARD TO LAB, STOOL  TROPONIN I (HIGH SENSITIVITY)  TROPONIN I (HIGH SENSITIVITY)      EKG  EKG Interpretation  Date/Time:    Ventricular Rate:    PR Interval:    QRS Duration:   QT Interval:    QTC Calculation:   R Axis:     Text Interpretation:           Imaging Studies ordered: I ordered imaging studies including chest x-ray I independently visualized and interpreted imaging. I agree with the radiologist interpretation   Medicines ordered and prescription drug management: Meds ordered this encounter  Medications   furosemide (LASIX) injection 40 mg    -I have reviewed the patients home medicines and have made adjustments as needed  Critical interventions IV push Lasix 2 mg  Consultations Obtained: I requested consultation with the cardiology,  and discussed lab and imaging findings as well as pertinent plan - they recommend: As above   Cardiac Monitoring: The patient was maintained on a cardiac monitor.  I personally viewed and interpreted the cardiac monitored which showed an underlying rhythm of: Bradycardic rate with irregular rhythm consistent with A-fib  Reevaluation: After the interventions noted above, I reevaluated the patient and found that they have :stayed the same  Co morbidities that complicate the patient evaluation  Past Medical History:  Diagnosis Date   Atrial fibrillation (HCC)    Carotid artery stenosis    CHF (congestive heart failure) (HCC)    Coronary artery disease    Dysrhythmia    Hyperlipidemia    Hypertension    LBBB (left bundle branch block)       Dispostion: Patient discussed with hospitalist who will accept patient for admission.  Home medications ordered.  Will hold beta-blocker as per cardiologist recommendations.  Final Clinical Impression(s) / ED Diagnoses Final diagnoses:   Shortness of breath  Elevated brain natriuretic  peptide (BNP) level  Bradycardia    Rx / DC Orders ED Discharge Orders     None         Marita Kansas, PA-C 12/05/22 2326    Tegeler, Canary Brim, MD 12/05/22 [phone]

## 2022-12-05 NOTE — ED Notes (Signed)
Pt pulse ox with ambulation 96-97% and HR in 90's

## 2022-12-05 NOTE — ED Notes (Signed)
Hemoccult card completed at bedside by Towner, PA. Results were negative.

## 2022-12-06 ENCOUNTER — Encounter (HOSPITAL_BASED_OUTPATIENT_CLINIC_OR_DEPARTMENT_OTHER): Payer: Self-pay | Admitting: Internal Medicine

## 2022-12-06 DIAGNOSIS — R0602 Shortness of breath: Secondary | ICD-10-CM | POA: Diagnosis present

## 2022-12-06 DIAGNOSIS — N182 Chronic kidney disease, stage 2 (mild): Secondary | ICD-10-CM | POA: Diagnosis present

## 2022-12-06 DIAGNOSIS — D649 Anemia, unspecified: Secondary | ICD-10-CM | POA: Diagnosis present

## 2022-12-06 DIAGNOSIS — E7800 Pure hypercholesterolemia, unspecified: Secondary | ICD-10-CM | POA: Diagnosis present

## 2022-12-06 DIAGNOSIS — Z7901 Long term (current) use of anticoagulants: Secondary | ICD-10-CM | POA: Diagnosis not present

## 2022-12-06 DIAGNOSIS — I130 Hypertensive heart and chronic kidney disease with heart failure and stage 1 through stage 4 chronic kidney disease, or unspecified chronic kidney disease: Secondary | ICD-10-CM | POA: Diagnosis present

## 2022-12-06 DIAGNOSIS — I2510 Atherosclerotic heart disease of native coronary artery without angina pectoris: Secondary | ICD-10-CM | POA: Diagnosis present

## 2022-12-06 DIAGNOSIS — I482 Chronic atrial fibrillation, unspecified: Secondary | ICD-10-CM | POA: Diagnosis not present

## 2022-12-06 DIAGNOSIS — I6523 Occlusion and stenosis of bilateral carotid arteries: Secondary | ICD-10-CM

## 2022-12-06 DIAGNOSIS — I4821 Permanent atrial fibrillation: Secondary | ICD-10-CM | POA: Diagnosis present

## 2022-12-06 DIAGNOSIS — Z9889 Other specified postprocedural states: Secondary | ICD-10-CM

## 2022-12-06 DIAGNOSIS — I472 Ventricular tachycardia, unspecified: Secondary | ICD-10-CM | POA: Diagnosis not present

## 2022-12-06 DIAGNOSIS — Z951 Presence of aortocoronary bypass graft: Secondary | ICD-10-CM | POA: Diagnosis not present

## 2022-12-06 DIAGNOSIS — I272 Pulmonary hypertension, unspecified: Secondary | ICD-10-CM | POA: Diagnosis present

## 2022-12-06 DIAGNOSIS — E876 Hypokalemia: Secondary | ICD-10-CM | POA: Diagnosis present

## 2022-12-06 DIAGNOSIS — Z8249 Family history of ischemic heart disease and other diseases of the circulatory system: Secondary | ICD-10-CM | POA: Diagnosis not present

## 2022-12-06 DIAGNOSIS — R001 Bradycardia, unspecified: Secondary | ICD-10-CM | POA: Insufficient documentation

## 2022-12-06 DIAGNOSIS — N179 Acute kidney failure, unspecified: Secondary | ICD-10-CM | POA: Diagnosis present

## 2022-12-06 DIAGNOSIS — I5032 Chronic diastolic (congestive) heart failure: Secondary | ICD-10-CM

## 2022-12-06 DIAGNOSIS — I5033 Acute on chronic diastolic (congestive) heart failure: Secondary | ICD-10-CM | POA: Diagnosis present

## 2022-12-06 DIAGNOSIS — Z79899 Other long term (current) drug therapy: Secondary | ICD-10-CM | POA: Diagnosis not present

## 2022-12-06 DIAGNOSIS — Z7982 Long term (current) use of aspirin: Secondary | ICD-10-CM | POA: Diagnosis not present

## 2022-12-06 DIAGNOSIS — Z1152 Encounter for screening for COVID-19: Secondary | ICD-10-CM | POA: Diagnosis not present

## 2022-12-06 DIAGNOSIS — I447 Left bundle-branch block, unspecified: Secondary | ICD-10-CM | POA: Diagnosis present

## 2022-12-06 DIAGNOSIS — R0609 Other forms of dyspnea: Secondary | ICD-10-CM | POA: Diagnosis not present

## 2022-12-06 DIAGNOSIS — Z83438 Family history of other disorder of lipoprotein metabolism and other lipidemia: Secondary | ICD-10-CM | POA: Diagnosis not present

## 2022-12-06 MED ORDER — ACETAMINOPHEN 650 MG RE SUPP: 650.0000 mg | Freq: Four times a day (QID) | RECTAL | Status: DC | PRN

## 2022-12-06 MED ORDER — OXYCODONE HCL 5 MG PO TABS: 5.0000 mg | ORAL_TABLET | ORAL | Status: DC | PRN

## 2022-12-06 MED ORDER — METOPROLOL SUCCINATE ER 25 MG PO TB24
12.5000 mg | ORAL_TABLET | ORAL | Status: DC
  Administered 2022-12-07: 12.5 mg via ORAL
  Filled 2022-12-06: qty 1

## 2022-12-06 MED ORDER — ROSUVASTATIN CALCIUM 20 MG PO TABS
20.0000 mg | ORAL_TABLET | Freq: Every day | ORAL | Status: DC
  Administered 2022-12-06 – 2022-12-09 (×4): 20 mg via ORAL
  Filled 2022-12-06 (×4): qty 1

## 2022-12-06 MED ORDER — ASPIRIN 81 MG PO TBEC
81.0000 mg | DELAYED_RELEASE_TABLET | Freq: Every day | ORAL | Status: DC
  Administered 2022-12-06 – 2022-12-08 (×3): 81 mg via ORAL
  Filled 2022-12-06 (×3): qty 1

## 2022-12-06 MED ORDER — ACETAMINOPHEN 325 MG PO TABS: 650.0000 mg | ORAL_TABLET | Freq: Four times a day (QID) | ORAL | Status: DC | PRN

## 2022-12-06 MED ORDER — SODIUM CHLORIDE 0.9% FLUSH
3.0000 mL | Freq: Two times a day (BID) | INTRAVENOUS | Status: DC
  Administered 2022-12-06 – 2022-12-09 (×5): 3 mL via INTRAVENOUS
  Filled 2022-12-06: qty 3

## 2022-12-06 MED ORDER — DOCUSATE SODIUM 100 MG PO CAPS
100.0000 mg | ORAL_CAPSULE | Freq: Two times a day (BID) | ORAL | Status: DC
  Filled 2022-12-06 (×7): qty 1

## 2022-12-06 MED ORDER — RIVAROXABAN 20 MG PO TABS
20.0000 mg | ORAL_TABLET | Freq: Every day | ORAL | Status: DC
  Administered 2022-12-06 – 2022-12-09 (×4): 20 mg via ORAL
  Filled 2022-12-06 (×4): qty 1

## 2022-12-06 MED ORDER — ONDANSETRON HCL 4 MG/2ML IJ SOLN: 4.0000 mg | Freq: Four times a day (QID) | INTRAMUSCULAR | Status: DC | PRN

## 2022-12-06 MED ORDER — TRAZODONE HCL 50 MG PO TABS
25.0000 mg | ORAL_TABLET | Freq: Every evening | ORAL | Status: DC | PRN
  Filled 2022-12-06: qty 1

## 2022-12-06 MED ORDER — POLYETHYLENE GLYCOL 3350 17 G PO PACK: 17.0000 g | PACK | Freq: Every day | ORAL | Status: DC | PRN

## 2022-12-06 MED ORDER — ONDANSETRON HCL 4 MG PO TABS
4.0000 mg | ORAL_TABLET | Freq: Four times a day (QID) | ORAL | Status: DC | PRN
  Filled 2022-12-06: qty 1

## 2022-12-06 MED ORDER — TORSEMIDE 20 MG PO TABS
10.0000 mg | ORAL_TABLET | Freq: Every day | ORAL | Status: DC
  Administered 2022-12-07: 10 mg via ORAL
  Filled 2022-12-06: qty 1

## 2022-12-06 MED ORDER — HYDRALAZINE HCL 20 MG/ML IJ SOLN: 5.0000 mg | INTRAMUSCULAR | Status: DC | PRN

## 2022-12-06 MED ORDER — SACUBITRIL-VALSARTAN 24-26 MG PO TABS
1.0000 | ORAL_TABLET | Freq: Two times a day (BID) | ORAL | Status: DC
  Administered 2022-12-06 – 2022-12-08 (×6): 1 via ORAL
  Filled 2022-12-06 (×6): qty 1

## 2022-12-06 MED ORDER — DAPAGLIFLOZIN PROPANEDIOL 10 MG PO TABS
10.0000 mg | ORAL_TABLET | Freq: Every day | ORAL | Status: DC
  Administered 2022-12-06 – 2022-12-08 (×3): 10 mg via ORAL
  Filled 2022-12-06 (×4): qty 1

## 2022-12-06 MED ORDER — BISACODYL 5 MG PO TBEC: 5.0000 mg | DELAYED_RELEASE_TABLET | Freq: Every day | ORAL | Status: DC | PRN

## 2022-12-06 MED ORDER — FUROSEMIDE 10 MG/ML IJ SOLN
40.0000 mg | Freq: Two times a day (BID) | INTRAMUSCULAR | Status: DC
  Filled 2022-12-06: qty 4

## 2022-12-06 NOTE — Progress Notes (Signed)
Progress Note.   Patient still at [facility].   Per ED PA he has been having dyspnea for couple weeks and has been treated at urgent care.   Clinically he is not volume overloaded - no jvp or leg swelling per ED PA  He denies orthopnea or pnd as well.   Renal function at baseline.  Hs trop negative times two Bnp elevated could be false positive as he is on entresto and his prior ntprobnp ha s been higher in the past  Cxr no sign of congestion Hb trends below baseline and he is on oac. Recommended to screen for bleeding and evaluate for other causes of dyspnea.   Hold bb due to bradycardia.   Gentle diuresising to avoid aki.   Recommended close outpatient follow up or if patient decides to stay will see in consult.   Dr.Gualberto Wahlen

## 2022-12-06 NOTE — Consult Note (Signed)
CARDIOLOGY CONSULT NOTE  Patient ID: [name] Rice MRN: [id number] DOB/AGE: 12-09-1950 72 y.o.  Admit date: 12/05/2022 Attending physician: Karmen Bongo, MD Primary Physician:  Kathyrn Lass Outpatient Cardiology Provider: Rex Kras, DO, [facility] Inpatient Cardiologist: Rex Kras, DO, [facility]  Reason of consultation: Dyspnea-evaluate for HFpEF Referring physician: Lin Givens  Chief complaint: Shortness of breath  HPI:  [name] Rice is a 72 y.o. 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  Approximately 2 weeks ago he went to urgent care for shortness of breath predominantly with effort related activities and productive cough.  He was treated for bronchitis.  However due to continued dyspnea on exertion he came to [place] ED for evaluation.  Clinically appears to be euvolemic, denies orthopnea/PND, lower extremity swelling, high sensitive troponins negative x 2, EKG illustrates A-fib with controlled ventricular rate, BNP elevated (may be false positive as he is on ARNI), chest x-ray did not illustrate vascular congestion.  His hemoglobin appears to be lower than baseline.  He had a guaiac test in the ED which was negative for acute bleed.  Cardiology has been asked to evaluate for heart failure exacerbation.  ALLERGIES: No Known Allergies  PAST MEDICAL HISTORY: Past Medical History:   Diagnosis Date   Atrial fibrillation ([place])    Carotid artery stenosis    Chronic diastolic (congestive) heart failure (HCC)    Coronary artery disease    Dysrhythmia    Hyperlipidemia    Hypertension    LBBB (left bundle branch block)     PAST SURGICAL HISTORY: Past Surgical History:  Procedure Laterality Date   CARDIOVERSION N/A 09/28/2016   Procedure: CARDIOVERSION;  Surgeon: Adrian Prows, MD;  Location: [place];  Service: Cardiovascular;  Laterality: N/A;   CATARACT EXTRACTION, BILATERAL     CHEST TUBE INSERTION N/A 06/24/2021   Procedure: CHEST TUBE INSERTION;  Surgeon: Freddi Starr, MD;  Location: [facility] ENDOSCOPY;  Service: Pulmonary;  Laterality: N/A;   CHOLECYSTECTOMY N/A 09/18/2020   Procedure: LAPAROSCOPIC PARTIAL CHOLECYSTECTOMY;  Surgeon: Alphonsa Overall, MD;  Location: [facility];  Service: General;  Laterality: N/A;   CLIPPING OF ATRIAL APPENDAGE Left 07/29/2020   Procedure: CLIPPING OF ATRIAL APPENDAGE;  Surgeon: Wonda Olds, MD;  Location: [place];  Service: Open Heart Surgery;  Laterality: Left;   CORONARY ARTERY BYPASS GRAFT N/A 07/29/2020   Procedure: CORONARY ARTERY BYPASS GRAFTING (CABG) TIMES FOUR USING BILATERAL INTERNAL MAMMARIES AND LEFT RADIAL ARTERY;  Surgeon: Wonda Olds, MD;  Location: [place];  Service: Open Heart Surgery;  Laterality: N/A;   ERCP N/A 09/17/2020   Procedure: ENDOSCOPIC RETROGRADE CHOLANGIOPANCREATOGRAPHY (ERCP);  Surgeon: Ladene Artist, MD;  Location: [facility];  Service: Endoscopy;  Laterality: N/A;   IR THORACENTESIS ASP PLEURAL SPACE W/IMG GUIDE  06/23/2021   LEFT HEART CATH AND CORONARY ANGIOGRAPHY N/A 07/25/2020   Procedure: LEFT HEART CATH AND CORONARY ANGIOGRAPHY;  Surgeon: Adrian Prows, MD;  Location: [place] CV LAB;  Service: Cardiovascular;  Laterality: N/A;   MAZE N/A 07/29/2020   Procedure: MAZE;  Surgeon: Wonda Olds, MD;  Location: [place];  Service: Open Heart Surgery;  Laterality: N/A;   RADIAL ARTERY HARVEST  Left 07/29/2020   Procedure: RADIAL ARTERY HARVEST;  Surgeon: Wonda Olds, MD;  Location: [place];  Service: Open Heart Surgery;  Laterality: Left;   REMOVAL OF STONES  09/17/2020   Procedure: REMOVAL OF STONES;  Surgeon: Ladene Artist, MD;  Location: [facility];  Service: Endoscopy;;   SPHINCTEROTOMY  09/17/2020   Procedure: Joan Mayans;  Surgeon: Ladene Artist, MD;  Location: [facility];  Service: Endoscopy;;   TEE WITHOUT CARDIOVERSION N/A 07/29/2020   Procedure: TRANSESOPHAGEAL ECHOCARDIOGRAM (TEE);  Surgeon: Wonda Olds, MD;  Location: [place];  Service: Open Heart Surgery;  Laterality: N/A;    FAMILY HISTORY: The patient's family history includes Heart disease in his father and mother; Hyperlipidemia in his mother; Hypertension in an other family member.   SOCIAL HISTORY:  The patient  reports that he has never smoked. He has never used smokeless tobacco. He reports that he does not drink alcohol and does not use drugs.  MEDICATIONS: Current Outpatient Medications  Medication Instructions   Acetaminophen-DM (CORICIDIN HBP COLD/COUGH/FLU PO) 1 tablet, Oral, Every 6 hours PRN   Aspirin 81 mg, Oral, Daily   dapagliflozin propanediol (FARXIGA) 10 mg, Oral, Daily before breakfast   metoprolol tartrate (LOPRESSOR) 12.5 mg, Oral, Daily   rivaroxaban (XARELTO) 20 mg, Oral, Daily with supper   rosuvastatin (CRESTOR) 20 MG tablet TAKE 1 TABLET BY MOUTH DAILY AT  BEDTIME   sacubitril-valsartan (ENTRESTO) 24-26 MG 1 tablet, Oral, 2 times daily   torsemide (DEMADEX) 20 MG tablet TAKE 1 TABLET BY MOUTH EVERY DAY IN THE MORNING    REVIEW OF SYSTEMS: Review of Systems  Cardiovascular:  Positive for dyspnea on exertion. Negative for chest pain, claudication, irregular heartbeat, leg swelling, near-syncope, orthopnea, palpitations, paroxysmal nocturnal dyspnea and syncope.  Respiratory:  Positive for cough, shortness of breath, sputum production and wheezing.    Hematologic/Lymphatic: Negative for bleeding problem.  Musculoskeletal:  Negative for muscle cramps and myalgias.  Neurological:  Negative for dizziness and light-headedness.   PHYSICAL EXAMINATION: PHYSICAL EXAM: Temp:  [97.5 F (36.4 C)-98.4 F (36.9 C)] 98 F (36.7 C) (01/29 1710) Pulse Rate:  [49-90] 60 (01/29 1710) Resp:  [13-23] 18 (01/29 1710) BP: (112-169)/(49-108) 147/68 (01/29 1710) SpO2:  [91 %-100 %] 99 % (01/29 1710) Weight:  [109.6 kg] 109.6 kg (01/29 1000)  Intake/Output:  Intake/Output Summary (Last 24 hours) at 12/06/2022 1928 Last data filed at 12/06/2022 1700 Gross per 24 hour  Intake 240 ml  Output 2150 ml  Net -1910 ml     Net IO Since Admission: -1,910 mL [12/06/22 1928]  Weights:     12/06/2022   10:00 AM 12/05/2022    5:08 PM 06/28/2022    2:43 PM  Last 3 Weights  Weight (lbs) 241 lb 9.6 oz 245 lb 249 lb  Weight (kg) 109.589 kg 111.131 kg 112.946 kg     Physical Exam  Constitutional: No distress. He appears chronically ill.  Appears older than stated age, hemodynamically stable.   HENT:  Poor dentition.  Neck: No JVD present.  Cardiovascular: Normal rate, S1 normal, S2 normal, intact distal pulses and normal pulses. An irregularly irregular rhythm present. Exam reveals no gallop, no S3 and no S4.  Murmur heard. Holosystolic murmur is present with a grade of 3/6 at the lower left sternal border. Pulmonary/Chest: Effort normal. No stridor. He has no wheezes.  He has no rales. He exhibits no tenderness.  Rhonchi's bilaterally.  Abdominal: Soft. Bowel sounds are normal. He exhibits no distension. There is no abdominal tenderness.  Musculoskeletal:        General: No edema.     Cervical back: Neck supple.  Neurological: He is alert and oriented to person, place, and time. He has intact cranial nerves (2-12).  Skin: Skin is warm and moist.   LAB RESULTS: Chemistry Recent Labs  Lab 12/05/22 1811  NA 135  K 3.5  CL 104  CO2 23  GLUCOSE 98   BUN 17  CREATININE 1.30*  CALCIUM 8.1*  PROT 7.1  ALBUMIN 3.6  AST 18  ALT 7  ALKPHOS 113  BILITOT 2.6*  GFRNONAA 59*  ANIONGAP 8    Hematology Recent Labs  Lab 12/05/22 1811  WBC 6.0  RBC 4.47  HGB 9.4*  HCT 33.5*  MCV 74.9*  MCH 21.0*  MCHC 28.1*  RDW 18.1*  PLT 254   High Sensitivity Troponin:   Recent Labs  Lab 12/05/22 1811 12/05/22 1949  TROPONINIHS 17 17     Cardiac EnzymesNo results for input(s): "TROPONINI" in the last 168 hours. No results for input(s): "TROPIPOC" in the last 168 hours.  BNP Recent Labs  Lab 12/05/22 1811  BNP 1,483.3*    DDimer No results for input(s): "DDIMER" in the last 168 hours.  Hemoglobin A1c:  Lab Results  Component Value Date   HGBA1C 5.2 07/26/2020   MPG 102.54 07/26/2020   TSH No results for input(s): "TSH" in the last 8760 hours. Lipid Panel  Lab Results  Component Value Date   CHOL 132 01/20/2021   HDL 40 01/20/2021   LDLCALC 74 01/20/2021   LDLDIRECT 71 01/20/2021   TRIG 93 01/20/2021   CHOLHDL 3.3 07/26/2020   Drugs of Abuse  No results found for: "LABOPIA", "COCAINSCRNUR", "LABBENZ", "AMPHETMU", "THCU", "LABBARB"    RADIOLOGY: 08/11/2020 CXR:  Persistent left pleural effusion with left base atelectasis. Scattered calcified granulomas noted. Lungs elsewhere clear. Stable cardiac enlargement with postoperative changes.  Aortic Atherosclerosis  12/05/2022: No active disease.    CARDIAC DATABASE: EKG: 05/14/2022: Atrial fibrillation, 54 bpm, LVH, IVCD suggestive of left bundle branch block, consider old anteroseptal and lateral infarct.  12/05/2022: Atrial fibrillation, 45 bpm, left bundle branch.   Coronary artery bypass grafting: 07/29/2020 (by Dr. Vickey Sages at [facility]):  Left Internal Mammary Artery to Distal Left Anterior Descending Coronary Artery; pedicled RIMA Graft to Posterior Descending Coronary Artery; left radial artery  Graft to Obtuse Marginal Branch of Left Circumflex Coronary Artery and  ramus intermedius as a sequenced graft. Bi-atrial Maze procedure and left atrial appendage clipping   Echocardiogram: 12/31/2019: 45-50% with regional wall motion abnormalities.  Please refer to the report for additional details.   09/27/2020: LVEF 45-50% with regional wall motion abnormalities, biatrial enlargement, see report for additional details   06/23/2022: Normal LV systolic function with visual EF 60-65%. Left ventricle cavity is normal in size. Moderate concentric hypertrophy of the left ventricle. Normal global wall motion. Indeterminate diastolic filling pattern, elevated LAP. Calculated EF 62%. Left atrial cavity is severely dilated at 4.7 cm. Right atrial cavity is severely dilated. Right ventricle cavity is moderately dilated. Normal right ventricular function. Structurally normal tricuspid valve.  Moderate tricuspid regurgitation. Mild pulmonary hypertension. RVSP measures 36 mmHg. IVC is dilated with respiratory variation.   Stress Testing:  02/18/2020: No previous exam available for comparison. Lexiscan nuclear stress test performed using 1-day protocol. Stress  EKG is non-diagnostic, as this is pharmacological stress test. In addition, rest and stress EKG showed atrial fibrillation with slow ventricular response, anterolateral T wave inversion. Stress LVEF 77%. Normal myocardial perfusion. Low risk study.   Heart Catheterization: 07/25/20:  RCA: Proximal RCA 80% stenosis, large vessel with mild disease in PL and PDA branches.  A secondary PL branch is subtotally occluded. LM: Distal 30-40% stenosis. LAD: LAD diffusely diseased, proximal diffuse 80% followed by tandem 90% stenosis.  Large D1 with moderate diffuse disease and proximal and mid tandem 70 and 80% stenosis.  Has secondary branches and is tortuous. Cx  and RI: Co-dominant Cx with Ostial circumflex 99% involving a moderate sized ramus with ostial 80 to 90% and proximal 90% stenosis. LV: Normal LVEDP.  No pressure  gradient across the aortic valve. Patent LIMA and RIMA and RIMA has ostial 20-30% stenosis. Subclavian arteries widely patent.  Right radial diffusely disease by [place]. 84mL contrast used.    Carotid duplex: 07/27/2020:  Right Carotid: Velocities in the right ICA are consistent with a 1-39% stenosis.  Left Carotid: Velocities in the left ICA are consistent with a 60-79% stenosis.  Vertebrals: Bilateral vertebral arteries demonstrate antegrade flow.    08/12/2021: Duplex suggests stenosis in the right internal carotid artery (>=70%).  Duplex suggests stenosis in the right external carotid artery (<50%). Duplex suggests stenosis in the left internal carotid artery (>=70%).  Duplex suggests stenosis in the left external carotid artery (<50%). The PSV internal/common carotid artery ratio is 6.49 on the right and 4.52 on the left consistent with a stenosis of >70% bilaterally. Antegrade left vertebral artery flow. No significant change compared to 01/13/2021. Follow up in six months is appropriate if clinically indicated.  CTA Head and Neck:  09/16/2021: 1. High-grade atheromatous stenosis at the bilateral proximal ICA measuring 70% on the left and at least 70% on the right. 2. Flow reducing stenosis at the bilateral vertebral origin. Aortic Atherosclerosis (ICD10-I70.0).   14 day extended Holter monitor: Dominant rhythm atrial fibrillation (HR between 25-137bpm, avg. 59bpm).  Overall HR 25-197 bpm.  Avg HR 59 bpm. 1519 pauses that were 3 secs or longer.  The longest pause 9.5 sec at 12:36 AM (07/04/2020), followed by 9.4 sec pause at 4:30 AM (07/11/2020), third longest pause 8.9 sec (07/08/2020).  These are auto-triggered events. 20 episodes of NSVT reported.  Longest episode 20 beats in duration for 10.5 seconds at an average rate 116 bpm.  The fastest episode was 6 beats in duration at average rate of 170 bpm.  Total ventricular ectopic burden <1%. Patient activated events: 0.  Scheduled  Meds:  aspirin EC  81 mg Oral QHS   dapagliflozin propanediol  10 mg Oral Daily   docusate sodium  100 mg Oral BID   metoprolol succinate  12.5 mg Oral [id number]   rivaroxaban  20 mg Oral Q supper   rosuvastatin  20 mg Oral QHS   sacubitril-valsartan  1 tablet Oral BID   sodium chloride flush  3 mL Intravenous Q12H   [START ON 12/07/2022] torsemide  10 mg Oral Daily    Continuous Infusions:   PRN Meds: acetaminophen **OR** acetaminophen, bisacodyl, hydrALAZINE, ondansetron **OR** ondansetron (ZOFRAN) IV, oxyCODONE, polyethylene glycol, traZODone  IMPRESSION & RECOMMENDATIONS: [name] Rice is a 72 y.o. 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  Impression:  Dyspnea -multifactorial likely secondary to recent bronchitis other contributing etiologies chronic HFpEF, atrial fibrillation, history of pleural effusion requiring robotic assisted thoracoscopy for decortication.  Chronic HFpEF, stage C, NYHA class II/III  Established CAD without angina pectoris, four-vessel bypass 07/2020  Bradycardia, asymptomatic  Permanent atrial fibrillation Long-term oral anticoagulation History of biatrial Maze procedure/left atrial appendage clipping  Asymptomatic carotid artery stenosis -followed by vascular surgery   Plan:  Patient presents to the hospital due to prolonged standing dyspnea over the last several weeks.  Initially treated for acute bronchitis at urgent care  without any significant improvement in symptoms.  He was concerned that his dyspnea on exertion could be cardiac in etiology and therefore went to med [facility].  He was transferred to [facility] due to concerns for heart failure exacerbation.  On clinical examination he is quite euvolemic, chest x-ray does not illustrate pulmonary vascular congestion, denies orthopnea/PND, his weight has remained relatively stable 1 to 2 pounds based on his ambulatory blood pressure and weight scale.  There was concern that he may have heart failure exacerbation due to isolated finding of an elevated BNP.  Differential diagnosis of elevated BNP is quite vast and may be a false positive in the setting of ARNI.  Since he is euvolemic and has diuresed well concerned that he may develop acute kidney injury and therefore we will stop IV Lasix for now.  Will start him on home dose torsemide and start Farxiga 10 mg p.o. daily first dose now.  In the ED there was concerns for possible bradycardia contributing to his symptoms.  He has had a long history of bradycardia but overall remains asymptomatic.  I have held his Lopressor as of 12/05/2022.  He ambulated with nursing staff on the floor and his heart rate increased up to 98 bpm and remained at 95% on room air.  Overall probability of chronotropic incompetence is low.  Patient was taking Lopressor 12.5 mg p.o. daily.  Will restart him back on Toprol-XL 12.5 mg p.o. every morning.  On physical examination he has diffuse bilateral wheezes and rhonchi's.  Given his recent bronchitis and productive sputum and history of pleural effusion requiring decortication I suspect that his dyspnea is likely pulmonary in origin.  Will order incentive spirometer at bedside and request attending physician to have pulmonary evaluate the patient for further guidance.  Other differential with regards to his dyspnea could also be anemia.  He had a guaiac test in the ED which was negative.   The patient does not endorse any evidence of bleeding.  Plan Limited echo to reevaluate LVEF, regional wall motion abnormalities given his history of CAD with prior surgical revascularization.  Will check a fasting lipid profile.  Telemetry reviewed.   Total encounter time 82 minutes. *Total Encounter Time as defined by the Centers for Medicare and Medicaid Services includes, in addition to the face-to-face time of a patient visit (documented in the note above) non-face-to-face time: obtaining and reviewing outside history, ordering and reviewing and changing medications, ordering and reviewing tests, reviewing ambulatory BP and weight readings, care coordination (communications with other health care professionals-  ED providers, attending, nursing staff) and documentation in the medical record.  Patient's questions and concerns were addressed to his satisfaction. He  voices understanding of the instructions provided during this encounter.   This note was created using a voice recognition software as a result there may be grammatical errors inadvertently enclosed that do not reflect the nature of this encounter. Every attempt is made to correct such errors.  Delilah Shan [facility]  Pager: [phone] Office: [phone] 12/06/2022, 7:28 PM

## 2022-12-06 NOTE — Progress Notes (Signed)
Heart Failure Navigator Progress Note  Assessed for Heart & Vascular TOC clinic readiness.  Patient does not meet criteria due to [facility] cardiology patient.   Navigator will sign off at this time.    Obrien Huskins, BSN, RN Heart Failure Nurse Navigator Secure Chat Only   

## 2022-12-06 NOTE — H&P (Signed)
History and Physical - Patient seen initially at [facility] and then again on arrival at [facility]   Patient: [name] Rice ERD:[id number] DOB: 1951-03-28 DOA: 12/05/2022 DOS: the patient was seen and examined on 12/06/2022 PCP: Pcp, No  Patient coming from: Home - lives alone; NOK: [place], [place], [phone]   Chief Complaint: SOB  HPI: [name] is a 72 y.o. male with medical history significant of afib, chronic diastolic CHF, CAD, pleural effusion s/p VATS in 06/2021, HTN, and HLD presenting with SOB.  He reports "bronchitis" with cough/cold for a couple of weeks.  He went to the doctor about a week ago and they were going to send him to [facility] and EMS didn't think he needed to go, he didn't want to go if he didn't have to.  He hasn't gotten better and decided to come in.  He has been taking Coricidin for the cough.  SOB is worse with exertion, + orthopnea.  Cough is productive of foamy clear sputum.  No LE edema.  He is always in afib, for the first time recently he thought it was racing but usually can't tell.    ER Course:  [facility] to [facility], per Dr. Arlean Hopping:  2 weeks of progressive shortness of breath, worse on exertion, associated with worsening orthopnea.  Denies any significant worsening of peripheral edema, no significant recent change in weight.  Compliant with torsemide.     HR 40s to 60s, increasing into the 90s with exertion; systolic blood pressures in the 130s to 150s; oxygen saturation in the mid to high 90s on room air.    BNP 1400, previously 200.  Hemoglobin reportedly slightly lower than baseline, with the patient denying any recent melena or hematochezia. Heme today.  CXR negative.   EDP discussed with pt's outpatient cardiologist (Dr. Odis Hollingshead), who rec gentle IV diuresis, holding patient's beta-blocker, and conveyed that he will formally consult and see the patient in the morning.      Review of Systems: As mentioned in the history of present illness. All other systems  reviewed and are negative. Past Medical History:  Diagnosis Date   Atrial fibrillation (HCC)    Carotid artery stenosis    Chronic diastolic (congestive) heart failure (HCC)    Coronary artery disease    Dysrhythmia    Hyperlipidemia    Hypertension    LBBB (left bundle branch block)    Past Surgical History:  Procedure Laterality Date   CARDIOVERSION N/A 09/28/2016   Procedure: CARDIOVERSION;  Surgeon: Jalise Zawistowski Decamp, MD;  Location: [facility] ENDOSCOPY;  Service: Cardiovascular;  Laterality: N/A;   CATARACT EXTRACTION, BILATERAL     CHEST TUBE INSERTION N/A 06/24/2021   Procedure: CHEST TUBE INSERTION;  Surgeon: Martina Sinner, MD;  Location: [facility] ENDOSCOPY;  Service: Pulmonary;  Laterality: N/A;   CHOLECYSTECTOMY N/A 09/18/2020   Procedure: LAPAROSCOPIC PARTIAL CHOLECYSTECTOMY;  Surgeon: Ovidio Kin, MD;  Location: [facility] ORS;  Service: General;  Laterality: N/A;   CLIPPING OF ATRIAL APPENDAGE Left 07/29/2020   Procedure: CLIPPING OF ATRIAL APPENDAGE;  Surgeon: Linden Dolin, MD;  Location: [facility] OR;  Service: Open Heart Surgery;  Laterality: Left;   CORONARY ARTERY BYPASS GRAFT N/A 07/29/2020   Procedure: CORONARY ARTERY BYPASS GRAFTING (CABG) TIMES FOUR USING BILATERAL INTERNAL MAMMARIES AND LEFT RADIAL ARTERY;  Surgeon: Linden Dolin, MD;  Location: [facility] OR;  Service: Open Heart Surgery;  Laterality: N/A;   ERCP N/A 09/17/2020   Procedure: ENDOSCOPIC RETROGRADE CHOLANGIOPANCREATOGRAPHY (ERCP);  Surgeon: Meryl Dare,  MD;  Location: [facility] ENDOSCOPY;  Service: Endoscopy;  Laterality: N/A;   IR THORACENTESIS ASP PLEURAL SPACE W/IMG GUIDE  06/23/2021   LEFT HEART CATH AND CORONARY ANGIOGRAPHY N/A 07/25/2020   Procedure: LEFT HEART CATH AND CORONARY ANGIOGRAPHY;  Surgeon: Lonnell Chaput Decamp, MD;  Location: [facility] INVASIVE CV LAB;  Service: Cardiovascular;  Laterality: N/A;   MAZE N/A 07/29/2020   Procedure: MAZE;  Surgeon: Linden Dolin, MD;  Location: [facility] OR;  Service: Open Heart Surgery;  Laterality: N/A;    RADIAL ARTERY HARVEST Left 07/29/2020   Procedure: RADIAL ARTERY HARVEST;  Surgeon: Linden Dolin, MD;  Location: [facility] OR;  Service: Open Heart Surgery;  Laterality: Left;   REMOVAL OF STONES  09/17/2020   Procedure: REMOVAL OF STONES;  Surgeon: Meryl Dare, MD;  Location: [facility] ENDOSCOPY;  Service: Endoscopy;;   SPHINCTEROTOMY  09/17/2020   Procedure: Dennison Mascot;  Surgeon: Meryl Dare, MD;  Location: [facility] ENDOSCOPY;  Service: Endoscopy;;   TEE WITHOUT CARDIOVERSION N/A 07/29/2020   Procedure: TRANSESOPHAGEAL ECHOCARDIOGRAM (TEE);  Surgeon: Linden Dolin, MD;  Location: [facility] OR;  Service: Open Heart Surgery;  Laterality: N/A;   Social History:  reports that he has never smoked. He has never used smokeless tobacco. He reports that he does not drink alcohol and does not use drugs.  No Known Allergies  Family History  Problem Relation Age of Onset   Heart disease Mother    Hyperlipidemia Mother    Heart disease Father    Hypertension Other     Prior to Admission medications   Medication Sig Start Date End Date Taking? Authorizing Provider  Aspirin 81 MG CAPS Take 81 mg by mouth daily. 07/21/21   Loreli Slot, MD  dapagliflozin propanediol (FARXIGA) 10 MG TABS tablet Take 1 tablet (10 mg total) by mouth daily before breakfast. 05/25/22 05/20/23  Tolia, Sunit, DO  metoprolol tartrate (LOPRESSOR) 25 MG tablet TAKE ONE-HALF TABLET BY MOUTH  DAILY 08/19/22   Tolia, Sunit, DO  rivaroxaban (XARELTO) 20 MG TABS tablet Take 1 tablet (20 mg total) by mouth daily with supper. 10/06/22   Tolia, Sunit, DO  rosuvastatin (CRESTOR) 20 MG tablet TAKE 1 TABLET BY MOUTH DAILY AT  BEDTIME 03/08/22   Tolia, Sunit, DO  sacubitril-valsartan (ENTRESTO) 24-26 MG Take 1 tablet by mouth 2 (two) times daily. 07/06/22   Tolia, Sunit, DO  torsemide (DEMADEX) 20 MG tablet TAKE 1 TABLET BY MOUTH EVERY DAY IN THE MORNING 11/30/22   Tessa Lerner, DO    Physical Exam: Vitals:   12/06/22 0830 12/06/22  0845 12/06/22 0900 12/06/22 0959  BP:   (!) 169/73 (!) 148/73  Pulse: 61 (!) 52 70 72  Resp:  (!) 23 14 16   Temp: 98.4 F (36.9 C)   97.6 F (36.4 C)  TempSrc: Oral   Axillary  SpO2: 97% 96% 96%   Weight:       Physical exam initially performed at [facility] and then again briefly upon arrival at [facility]:  General:  Appears calm and comfortable and is in NAD, on RA Eyes:  EOMI, normal lids, iris ENT:  grossly normal hearing, lips & tongue, mmm; poor dentition Neck:  no LAD, masses or thyromegaly Cardiovascular:  RRR, no m/r/g. Trace LE edema.  Respiratory:   CTA bilaterally with no wheezes/rales/rhonchi.  Normal respiratory effort. Abdomen:  soft, NT, ND Skin:  no rash or induration seen on limited exam Musculoskeletal:  grossly normal tone BUE/BLE, good ROM, no bony abnormality Psychiatric:  grossly normal mood and affect, speech fluent and appropriate, AOx3 Neurologic:  CN 2-12 grossly intact, moves all extremities in coordinated fashion   Radiological Exams on Admission: Independently reviewed - see discussion in A/P where applicable  DG Chest Port 1 View  Result Date: 12/05/2022 CLINICAL DATA:  Shortness of breath and chest pain for 2 weeks, initial encounter EXAM: PORTABLE CHEST 1 VIEW COMPARISON:  12/30/2021 FINDINGS: Cardiac shadow is stable. Postsurgical changes are again seen. Lungs are well aerated bilaterally. Mild scarring in the left base is seen and stable. No bony abnormality is noted. IMPRESSION: No active disease. Electronically Signed   By: Inez Catalina M.D.   On: 12/05/2022 17:39    EKG: Independently reviewed.  Afib with rate 45; IVCD with no evidence of acute ischemia   Labs on Admission: I have personally reviewed the available labs and imaging studies at the time of the admission.  Pertinent labs:    BUN 17/Creatinine 1.30/GFR 59 - stable Bili 2.6 BNP 1483.3; 1243.5 on 6/22 HS troponin 17, 17 WBC 6 Hgb 9.4 COVID/flu/RSV negative   Assessment and  Plan: Principal Problem:   Acute on chronic diastolic heart failure (HCC) Active Problems:   Atrial fibrillation, chronic (HCC)   Pure hypercholesterolemia   CAD, multiple vessel   Benign hypertension   Symptomatic bradycardia    Acute on chronic diastolic CHF -Patient with known h/o chronic diastolic CHF presenting with worsening SOB and orthopnea -CXR negative for pulmonary edema -Mildly elevated BNP compared with prior -There appears to be a component of acute decompensated CHF contributing to presentation -Will admit, as per the Emergency HF Mortality Risk Grade.  The patient has: hemodynamic instability with bradycardia (see below) -Will not plan to repeat echocardiogram unless desired by cardiology - last done on 8/16 and was unremarkable at that time with preserved EF -CHF order set utilized -Was given Lasix 40 mg x 1 in ER and will repeat with 40 mg IV BID -Continue [place] O2 for now -Stable kidney function at this time, will follow -Continue Entresto -[employer]  Afib with symptomatic bradycardia -Patient with recurrent HR into 30-40s, which may be driving his recurrent subacute CHF -/sp L atrial appendage ligation/Maze operation -He is on metoprolol so will hold as this may be contributing -If not improving, he may require pacemaker placement -Cardiology is consulting -Continue Xarelto  CAD -s/p CABG -No c/o chest pain at this time -Continue ASA  HTN -Hold Lopressor, continue Entresto -Will also add prn hydralazine  HLD -Continue rosuvastatin  S/p VATS -06/2021 decortication -He reports slow recovery following procedure but he is not having issues anymore      Advance Care Planning:   Code Status: Full Code - Code status was discussed with the patient and/or family at the time of admission.  The patient would want to receive full resuscitative measures at this time.   Consults: Cardiology; [facility] team; nutrition; PT/OT  DVT Prophylaxis: Xarelto  Family  Communication: None present  Severity of Illness: The appropriate patient status for this patient is INPATIENT. Inpatient status is judged to be reasonable and necessary in order to provide the required intensity of service to ensure the patient's safety. The patient's presenting symptoms, physical exam findings, and initial radiographic and laboratory data in the context of their chronic comorbidities is felt to place them at high risk for further clinical deterioration. Furthermore, it is not anticipated that the patient will be medically stable for discharge from the hospital within 2 midnights of admission.   *  I certify that at the point of admission it is my clinical judgment that the patient will require inpatient hospital care spanning beyond 2 midnights from the point of admission due to high intensity of service, high risk for further deterioration and high frequency of surveillance required.*  Author: Karmen Bongo, MD 12/06/2022 10:10 AM  For on call review [email or web address].

## 2022-12-06 NOTE — Progress Notes (Signed)
PT Cancellation Note  Patient Details Name: COADY TRAIN MRN: [id number] DOB: 27-Oct-1951   Cancelled Treatment:    Reason Eval/Treat Not Completed: PT screened, no needs identified, will sign off. Pt is independent all functional mobility. Distance limitations at baseline due to DOE. Maintaining SpO2 in 90s on RA. Pt/RN reporting no PT needs. Will refer to mobility team to ensure pt is ambulating in hallway during hospitalization.   Lorriane Shire 12/06/2022, 10:36 AM

## 2022-12-06 NOTE — TOC Progression Note (Signed)
Transition of Care [facility]) - Progression Note    Patient Details  Name: [name] Rice MRN: [id number] Date of Birth: 1951-06-09  [facility] CM/SW Contact  Zenon Mayo, RN Phone Number: 12/06/2022, 10:52 AM  Clinical Narrative:    From home, presents with CHF,SOB, conts on IV lasix. TOC following.         Expected Discharge Plan and Services                                               Social Determinants of Health (SDOH) Interventions SDOH Screenings   Tobacco Use: Low Risk  (12/06/2022)    Readmission Risk Interventions    09/25/2020   12:10 PM  Readmission Risk Prevention Plan  Transportation Screening Complete  PCP or Specialist Appt within 3-5 Days Complete  HRI or Home Care Consult Complete

## 2022-12-06 NOTE — ED Notes (Signed)
ED TO INPATIENT HANDOFF REPORT  ED Nurse Name and Phone #: [name] Name/Age/Gender [name] Rice 72 y.o. male Room/Bed: MH10/MH10  Code Status   Code Status: Prior  Home/SNF/Other Home Patient oriented to: self, place, time, and situation Is this baseline? Yes   Triage Complete: Triage complete  Chief Complaint Acute on chronic diastolic heart failure ([place]) [I50.33]  Triage Note Pt arrives pov, steady gait with c/o shob and "chest congestion" x 2 weeks. TX at [place] 10 days pta for similar symptoms    Allergies No Known Allergies  Level of Care/Admitting Diagnosis ED Disposition     ED Disposition  Admit   Condition  --   [place]: [facility] [[id number]]  Level of Care: Telemetry Cardiac [103]  May admit patient to [facility] or [facility] if equivalent level of care is available:: No  Interfacility transfer: Yes  Covid Evaluation: Asymptomatic - no recent exposure (last 10 days) testing not required  Diagnosis: Acute on chronic diastolic heart failure ([place]) [428.33.ICD-9-CM]  Admitting Physician: Rhetta Mura [[id number]]  Attending Physician: Rhetta Mura [[id number]]  Certification:: I certify this patient will need inpatient services for at least 2 midnights  Estimated Length of Stay: 2          B Medical/Surgery History Past Medical History:  Diagnosis Date   Atrial fibrillation ([place])    Carotid artery stenosis    Chronic diastolic (congestive) heart failure ([place])    Coronary artery disease    Dysrhythmia    Hyperlipidemia    Hypertension    LBBB (left bundle branch block)    Past Surgical History:  Procedure Laterality Date   CARDIOVERSION N/A 09/28/2016   Procedure: CARDIOVERSION;  Surgeon: Adrian Prows, MD;  Location: [place];  Service: Cardiovascular;  Laterality: N/A;   CATARACT EXTRACTION, BILATERAL     CHEST TUBE INSERTION N/A 06/24/2021   Procedure: CHEST TUBE INSERTION;  Surgeon: Freddi Starr, MD;  Location[facility] END[facility]COPY;  Service: Pulmonary;  Laterality: N/A;   CHOLECYSTECTOMY N/A 09/18/2020   Procedure: LAPAROSCOPIC PARTIAL CHOLECYSTECTOMY;  Surgeon: Alphonsa Overall, MD;  Location: [facility];  Service: General;  Laterality: N/A;   CLIPPING OF ATRIAL APPENDAGE Left 07/29/2020   Procedure: CLIPPING OF ATRIAL APPENDAGE;  Surgeon: Wonda Olds, MD;  Location: [place];  Service: Open Heart Surgery;  Laterality: Left;   CORONARY ARTERY BYPASS GRAFT N/A 07/29/2020   Procedure: CORONARY ARTERY BYPASS GRAFTING (CABG) TIMES FOUR USING BILATERAL INTERNAL MAMMARIES AND LEFT RADIAL ARTERY;  Surgeon: Wonda Olds, MD;  Location: [place];  Service: Open Heart Surgery;  Laterality: N/A;   ERCP N/A 09/17/2020   Procedure: ENDOSCOPIC RETROGRADE CHOLANGIOPANCREATOGRAPHY (ERCP);  Surgeon: Ladene Artist, MD;  Location: [facility];  Service: Endoscopy;  Laterality: N/A;   IR THORACENTESIS ASP PLEURAL SPACE W/IMG GUIDE  06/23/2021   LEFT HEART CATH AND CORONARY ANGIOGRAPHY N/A 07/25/2020   Procedure: LEFT HEART CATH AND CORONARY ANGIOGRAPHY;  Surgeon: Adrian Prows, MD;  Location: [place] [facility];  Service: Cardiovascular;  Laterality: N/A;   MAZE N/A 07/29/2020   Procedure: MAZE;  Surgeon: Wonda Olds, MD;  Location: [place];  Service: Open Heart Surgery;  Laterality: N/A;   RADIAL ARTERY HARVEST Left 07/29/2020   Procedure: RADIAL ARTERY HARVEST;  Surgeon: Wonda Olds, MD;  Location: [place];  Service: Open Heart Surgery;  Laterality: Left;   REMOVAL OF STONES  09/17/2020   Procedure: REMOVAL OF  STONES;  Surgeon: Meryl Dare, MD;  Location: [facility];  Service: Endoscopy;;   SPHINCTEROTOMY  09/17/2020   Procedure: Dennison Mascot;  Surgeon: Meryl Dare, MD;  Location[facility];  Service: Endoscopy;;   TEE WITHOUT CARDIOVERSION N/A 07/29/2020   Procedure: TRANSESOPHAGEAL ECHOCARDIOGRAM (TEE);  Surgeon: Linden Dolin, MD;  Location: [facility] OR;  Service: Open Heart  Surgery;  Laterality: N/A;     A IV Location/Drains/Wounds Patient Lines/Drains/Airways Status     Active Line/Drains/Airways     Name Placement date Placement time Site Days   Peripheral IV 12/05/22 20 G Anterior;Distal;Left;Upper Arm 12/05/22  1814  Arm  1   Incision (Closed) 09/18/20 N/A Other (Comment) 09/18/20  1215  -- 809   Incision (Closed) 07/01/21 Chest Left 07/01/21  1024  -- 523   Incision - 5 Ports Abdomen Right;Lateral Right;Medial Umbilicus Left;Upper Left 09/18/20  1205  -- 809            Intake/Output Last 24 hours  Intake/Output Summary (Last 24 hours) at 12/06/2022 0741 Last data filed at 12/06/2022 0356 Gross per 24 hour  Intake --  Output 2150 ml  Net -2150 ml    Labs/Imaging Results for orders placed or performed during the hospital encounter of 12/05/22 (from the past 48 hour(s))  Resp panel by RT-PCR (RSV, Flu A&B, Covid) Nasopharyngeal Swab     Status: None   Collection Time: 12/05/22  5:11 PM   Specimen: Nasopharyngeal Swab; Nasal Swab  Result Value Ref Range   SARS Coronavirus 2 by RT PCR NEGATIVE NEGATIVE    Comment: (NOTE) SARS-CoV-2 target nucleic acids are NOT DETECTED.  The SARS-CoV-2 RNA is generally detectable in upper respiratory specimens during the acute phase of infection. The lowest concentration of SARS-CoV-2 viral copies this assay can detect is 138 copies/mL. A negative result does not preclude SARS-Cov-2 infection and should not be used as the sole basis for treatment or other patient management decisions. A negative result may occur with  improper specimen collection/handling, submission of specimen other than nasopharyngeal swab, presence of viral mutation(s) within the areas targeted by this assay, and inadequate number of viral copies(<138 copies/mL). A negative result must be combined with clinical observations, patient history, and epidemiological information. The expected result is Negative.  Fact Sheet for  Patients:  BloggerCourse.com  Fact Sheet for Healthcare Providers:  SeriousBroker.it  This test is no t yet approved or cleared by the [place] FDA and  has been authorized for detection and/or diagnosis of SARS-CoV-2 by FDA under an Emergency Use Authorization (EUA). This EUA will remain  in effect (meaning this test can be used) for the duration of the COVID-19 declaration under Section 564(b)(1) of the Act, 21 U.S.C.section 360bbb-3(b)(1), unless the authorization is terminated  or revoked sooner.       Influenza A by PCR NEGATIVE NEGATIVE   Influenza B by PCR NEGATIVE NEGATIVE    Comment: (NOTE) The Xpert Xpress SARS-CoV-2/FLU/RSV plus assay is intended as an aid in the diagnosis of influenza from Nasopharyngeal swab specimens and should not be used as a sole basis for treatment. Nasal washings and aspirates are unacceptable for Xpert Xpress SARS-CoV-2/FLU/RSV testing.  Fact Sheet for Patients: BloggerCourse.com  Fact Sheet for Healthcare Providers: SeriousBroker.it  This test is not yet approved or cleared by the [place] FDA and has been authorized for detection and/or diagnosis of SARS-CoV-2 by FDA under an Emergency Use Authorization (EUA). This EUA will remain in effect (meaning this test can  be used) for the duration of the COVID-19 declaration under Section 564(b)(1) of the Act, 21 U.S.C. section 360bbb-3(b)(1), unless the authorization is terminated or revoked.     Resp Syncytial Virus by PCR NEGATIVE NEGATIVE    Comment: (NOTE) Fact Sheet for Patients: BloggerCourse.com  Fact Sheet for Healthcare Providers: SeriousBroker.it  This test is not yet approved or cleared by the [place] FDA and has been authorized for detection and/or diagnosis of SARS-CoV-2 by FDA under an Emergency Use Authorization  (EUA). This EUA will remain in effect (meaning this test can be used) for the duration of the COVID-19 declaration under Section 564(b)(1) of the Act, 21 U.S.C. section 360bbb-3(b)(1), unless the authorization is terminated or revoked.  Performed at [facility], [address] [place], [place] [address]   CBC with Differential     Status: Abnormal   Collection Time: 12/05/22  6:11 PM  Result Value Ref Range   WBC 6.0 4.0 - 10.5 K/uL   RBC 4.47 4.22 - 5.81 MIL/uL   Hemoglobin 9.4 (L) 13.0 - 17.0 g/dL   HCT 33.5 (L) 45.6 - 25.6 %   MCV 74.9 (L) 80.0 - 100.0 fL   MCH 21.0 (L) 26.0 - 34.0 pg   MCHC 28.1 (L) 30.0 - 36.0 g/dL   RDW 38.9 (H) 37.3 - 42.8 %   Platelets 254 150 - 400 K/uL   nRBC 0.0 0.0 - 0.2 %   Neutrophils Relative % 66 %   Neutro Abs 4.0 1.7 - 7.7 K/uL   Lymphocytes Relative 15 %   Lymphs Abs 0.9 0.7 - 4.0 K/uL   Monocytes Relative 15 %   Monocytes Absolute 0.9 0.1 - 1.0 K/uL   Eosinophils Relative 3 %   Eosinophils Absolute 0.2 0.0 - 0.5 K/uL   Basophils Relative 1 %   Basophils Absolute 0.1 0.0 - 0.1 K/uL   Immature Granulocytes 0 %   Abs Immature Granulocytes 0.02 0.00 - 0.07 K/uL    Comment: Performed at [facility], [address] [place], [place] [address]  Comprehensive metabolic panel     Status: Abnormal   Collection Time: 12/05/22  6:11 PM  Result Value Ref Range   Sodium 135 135 - 145 mmol/L   Potassium 3.5 3.5 - 5.1 mmol/L   Chloride 104 98 - 111 mmol/L   CO2 23 22 - 32 mmol/L   Glucose, Bld 98 70 - 99 mg/dL    Comment: Glucose reference range applies only to samples taken after fasting for at least 8 hours.   BUN 17 8 - 23 mg/dL   Creatinine, Ser 5.72 (H) 0.61 - 1.24 mg/dL   Calcium 8.1 (L) 8.9 - 10.3 mg/dL   Total Protein 7.1 6.5 - 8.1 g/dL   Albumin 3.6 3.5 - 5.0 g/dL   AST 18 15 - 41 U/L   ALT 7 0 - 44 U/L   Alkaline Phosphatase 113 38 - 126 U/L   Total Bilirubin 2.6 (H) 0.3 - 1.2 mg/dL   GFR, Estimated 59 (L)  >60 mL/min    Comment: (NOTE) Calculated using the CKD-EPI Creatinine Equation (2021)    Anion gap 8 5 - 15    Comment: Performed at [facility], [address] [place], [place] [address]  Magnesium     Status: None   Collection Time: 12/05/22  6:11 PM  Result Value Ref Range   Magnesium 2.3 1.7 - 2.4 mg/dL  Comment: Performed at [facility], [address], [place], [place] [address]  Brain natriuretic peptide     Status: Abnormal   Collection Time: 12/05/22  6:11 PM  Result Value Ref Range   B Natriuretic Peptide 1,483.3 (H) 0.0 - 100.0 pg/mL    Comment: Performed at [facility], [address] [place], [place] [address]  Troponin I (High Sensitivity)     Status: None   Collection Time: 12/05/22  6:11 PM  Result Value Ref Range   Troponin I (High Sensitivity) 17 <18 ng/L    Comment: (NOTE) Elevated high sensitivity troponin I (hsTnI) values and significant  changes across serial measurements may suggest ACS but many other  chronic and acute conditions are known to elevate hsTnI results.  Refer to the "Links" section for chest pain algorithms and additional  guidance. Performed at [facility], [address] [place], [place] [address]   Troponin I (High Sensitivity)     Status: None   Collection Time: 12/05/22  7:49 PM  Result Value Ref Range   Troponin I (High Sensitivity) 17 <18 ng/L    Comment: (NOTE) Elevated high sensitivity troponin I (hsTnI) values and significant  changes across serial measurements may suggest ACS but many other  chronic and acute conditions are known to elevate hsTnI results.  Refer to the "Links" section for chest pain algorithms and additional  guidance. Performed at [facility], [address] [place], [place] [address]    DG Chest [place] 1 View  Result Date: 12/05/2022 CLINICAL DATA:  Shortness of breath and chest pain for 2 weeks, initial encounter EXAM: PORTABLE CHEST 1 VIEW  COMPARISON:  12/30/2021 FINDINGS: Cardiac shadow is stable. Postsurgical changes are again seen. Lungs are well aerated bilaterally. Mild scarring in the left base is seen and stable. No bony abnormality is noted. IMPRESSION: No active disease. Electronically Signed   By: Inez Catalina M.D.   On: 12/05/2022 17:39    Pending Labs Unresulted Labs (From admission, onward)     Start     Ordered   12/05/22 2110  Occult blood card to lab, stool  Once,   URGENT        12/05/22 2109            Vitals/Pain Today's Vitals   12/06/22 0715 12/06/22 0715 12/06/22 0722 12/06/22 0730  BP: 129/81 129/81    Pulse: 70 65  65  Resp: 19 15  15   Temp:  98 F (36.7 C)    TempSrc:  Oral    SpO2: 97% 96%  96%  Weight:      PainSc:  0-No pain 0-No pain     Isolation Precautions No active isolations  Medications Medications  sacubitril-valsartan (ENTRESTO) 24-26 mg per tablet (has no administration in time range)  aspirin EC tablet 81 mg (81 mg Oral Given 12/06/22 0023)  rivaroxaban (XARELTO) tablet 20 mg (has no administration in time range)  dapagliflozin propanediol (FARXIGA) tablet 10 mg (has no administration in time range)  rosuvastatin (CRESTOR) tablet 20 mg (has no administration in time range)  furosemide (LASIX) injection 40 mg (40 mg Intravenous Given 12/05/22 2156)    Mobility walks     Focused Assessments Cardiac Assessment Handoff:  Cardiac Rhythm: Normal sinus rhythm Lab Results  Component Value Date   CKTOTAL 39 (L) 07/24/2020   No results found for: "DDIMER" Does the Patient currently have chest pain? No  R Recommendations: See Admitting Provider Note  Report given to:   Additional Notes:  Pt is alert and oriented x 4. Pt is independent. Pt is on room air and cardiac monitoring is showing NSR. Pt denies chest pain and shortness of breath at this time

## 2022-12-06 NOTE — Progress Notes (Signed)
Patient ambulated up and down hallway three times, patient stated he was short of breath and wanted to "stay close to his room". HR peaked at 98 and O2 was 95% after returning to his room.

## 2022-12-07 ENCOUNTER — Encounter (HOSPITAL_COMMUNITY)
Admission: EM | Disposition: A | Discharge status: Home / Self Care | Payer: Self-pay | Source: Home / Self Care | Attending: Internal Medicine

## 2022-12-07 ENCOUNTER — Inpatient Hospital Stay (HOSPITAL_COMMUNITY): Payer: Medicare Other

## 2022-12-07 DIAGNOSIS — I10 Essential (primary) hypertension: Secondary | ICD-10-CM

## 2022-12-07 DIAGNOSIS — R0609 Other forms of dyspnea: Secondary | ICD-10-CM

## 2022-12-07 DIAGNOSIS — E7800 Pure hypercholesterolemia, unspecified: Secondary | ICD-10-CM

## 2022-12-07 DIAGNOSIS — N182 Chronic kidney disease, stage 2 (mild): Secondary | ICD-10-CM

## 2022-12-07 DIAGNOSIS — I5032 Chronic diastolic (congestive) heart failure: Secondary | ICD-10-CM

## 2022-12-07 DIAGNOSIS — I482 Chronic atrial fibrillation, unspecified: Secondary | ICD-10-CM | POA: Diagnosis not present

## 2022-12-07 DIAGNOSIS — I2510 Atherosclerotic heart disease of native coronary artery without angina pectoris: Secondary | ICD-10-CM | POA: Diagnosis not present

## 2022-12-07 HISTORY — PX: RIGHT HEART CATH: CATH118263

## 2022-12-07 LAB — POCT I-STAT EG7
Acid-Base Excess: 2 mmol/L (ref 0.0–2.0)
Acid-Base Excess: 2 mmol/L (ref 0.0–2.0)
Bicarbonate: 26.7 mmol/L (ref 20.0–28.0)
Bicarbonate: 27.2 mmol/L (ref 20.0–28.0)
Calcium, Ion: 1.1 mmol/L — ABNORMAL LOW (ref 1.15–1.40)
Calcium, Ion: 1.12 mmol/L — ABNORMAL LOW (ref 1.15–1.40)
HCT: 32 % — ABNORMAL LOW (ref 39.0–52.0)
HCT: 32 % — ABNORMAL LOW (ref 39.0–52.0)
Hemoglobin: 10.9 g/dL — ABNORMAL LOW (ref 13.0–17.0)
Hemoglobin: 10.9 g/dL — ABNORMAL LOW (ref 13.0–17.0)
O2 Saturation: 61 %
O2 Saturation: 64 %
Potassium: 3.3 mmol/L — ABNORMAL LOW (ref 3.5–5.1)
Potassium: 3.3 mmol/L — ABNORMAL LOW (ref 3.5–5.1)
Sodium: 142 mmol/L (ref 135–145)
Sodium: 142 mmol/L (ref 135–145)
TCO2: 28 mmol/L (ref 22–32)
TCO2: 29 mmol/L (ref 22–32)
pCO2, Ven: 41.1 mmHg — ABNORMAL LOW (ref 44–60)
pCO2, Ven: 42.4 mmHg — ABNORMAL LOW (ref 44–60)
pH, Ven: 7.416 (ref 7.25–7.43)
pH, Ven: 7.422 (ref 7.25–7.43)
pO2, Ven: 32 mmHg (ref 32–45)
pO2, Ven: 33 mmHg (ref 32–45)

## 2022-12-07 LAB — BASIC METABOLIC PANEL
Anion gap: 11 (ref 5–15)
BUN: 13 mg/dL (ref 8–23)
CO2: 23 mmol/L (ref 22–32)
Calcium: 8.3 mg/dL — ABNORMAL LOW (ref 8.9–10.3)
Chloride: 103 mmol/L (ref 98–111)
Creatinine, Ser: 1.21 mg/dL (ref 0.61–1.24)
GFR, Estimated: >60 mL/min (ref >60)
Glucose, Bld: 99 mg/dL (ref 70–99)
Potassium: 3.1 mmol/L — ABNORMAL LOW (ref 3.5–5.1)
Sodium: 137 mmol/L (ref 135–145)

## 2022-12-07 LAB — LIPID PANEL
Cholesterol: 82 mg/dL (ref 0–200)
HDL: 32 mg/dL — ABNORMAL LOW (ref >40)
LDL Cholesterol: 39 mg/dL (ref 0–99)
Total CHOL/HDL Ratio: 2.6 RATIO
Triglycerides: 56 mg/dL (ref <150)
VLDL: 11 mg/dL (ref 0–40)

## 2022-12-07 LAB — CBC
HCT: 32.1 % — ABNORMAL LOW (ref 39.0–52.0)
Hemoglobin: 8.6 g/dL — ABNORMAL LOW (ref 13.0–17.0)
MCH: 20.5 pg — ABNORMAL LOW (ref 26.0–34.0)
MCHC: 26.8 g/dL — ABNORMAL LOW (ref 30.0–36.0)
MCV: 76.4 fL — ABNORMAL LOW (ref 80.0–100.0)
Platelets: 247 10*3/uL (ref 150–400)
RBC: 4.2 MIL/uL — ABNORMAL LOW (ref 4.22–5.81)
RDW: 18.1 % — ABNORMAL HIGH (ref 11.5–15.5)
WBC: 5.2 10*3/uL (ref 4.0–10.5)
nRBC: 0 % (ref 0.0–0.2)

## 2022-12-07 LAB — ECHOCARDIOGRAM LIMITED
Height: 73 in
S' Lateral: 4.7 cm
Weight: 3844.8 oz

## 2022-12-07 LAB — HIV ANTIBODY (ROUTINE TESTING W REFLEX): HIV Screen 4th Generation wRfx: NONREACTIVE

## 2022-12-07 LAB — LDL CHOLESTEROL, DIRECT: Direct LDL: 38 mg/dL (ref 0–99)

## 2022-12-07 SURGERY — RIGHT HEART CATH

## 2022-12-07 MED ORDER — FUROSEMIDE 10 MG/ML IJ SOLN
40.0000 mg | Freq: Once | INTRAMUSCULAR | Status: DC
  Filled 2022-12-07: qty 4

## 2022-12-07 MED ORDER — LIDOCAINE HCL (PF) 1 % IJ SOLN
INTRAMUSCULAR | Status: DC | PRN
  Administered 2022-12-07: 2 mL

## 2022-12-07 MED ORDER — LIDOCAINE HCL (PF) 1 % IJ SOLN
INTRAMUSCULAR | Status: AC
  Filled 2022-12-07: qty 30

## 2022-12-07 MED ORDER — HEPARIN (PORCINE) IN NACL 1000-0.9 UT/500ML-% IV SOLN
INTRAVENOUS | Status: DC | PRN
  Administered 2022-12-07: 500 mL

## 2022-12-07 MED ORDER — POTASSIUM CHLORIDE CRYS ER 20 MEQ PO TBCR
40.0000 meq | EXTENDED_RELEASE_TABLET | ORAL | Status: AC
  Administered 2022-12-07 (×2): 40 meq via ORAL
  Filled 2022-12-07 (×2): qty 2

## 2022-12-07 MED ORDER — SPIRONOLACTONE 12.5 MG HALF TABLET
12.5000 mg | ORAL_TABLET | Freq: Every day | ORAL | Status: DC
  Administered 2022-12-07: 12.5 mg via ORAL
  Filled 2022-12-07: qty 1

## 2022-12-07 MED ORDER — HYDRALAZINE HCL 20 MG/ML IJ SOLN: 10.0000 mg | INTRAMUSCULAR | Status: AC | PRN

## 2022-12-07 MED ORDER — LABETALOL HCL 5 MG/ML IV SOLN: 10.0000 mg | INTRAVENOUS | Status: AC | PRN

## 2022-12-07 MED ORDER — ACETAMINOPHEN 325 MG PO TABS: 650.0000 mg | ORAL_TABLET | ORAL | Status: DC | PRN

## 2022-12-07 MED ORDER — ONDANSETRON HCL 4 MG/2ML IJ SOLN: 4.0000 mg | Freq: Four times a day (QID) | INTRAMUSCULAR | Status: DC | PRN

## 2022-12-07 MED ORDER — SODIUM CHLORIDE 0.9 % IV SOLN: 250.0000 mL | INTRAVENOUS | Status: DC | PRN

## 2022-12-07 MED ORDER — FUROSEMIDE 10 MG/ML IJ SOLN
40.0000 mg | Freq: Two times a day (BID) | INTRAMUSCULAR | Status: DC
  Administered 2022-12-07 – 2022-12-08 (×3): 40 mg via INTRAVENOUS
  Filled 2022-12-07 (×3): qty 4

## 2022-12-07 MED ORDER — SODIUM CHLORIDE 0.9% FLUSH
3.0000 mL | Freq: Two times a day (BID) | INTRAVENOUS | Status: DC
  Administered 2022-12-08 – 2022-12-09 (×3): 3 mL via INTRAVENOUS

## 2022-12-07 MED ORDER — SODIUM CHLORIDE 0.9% FLUSH: 3.0000 mL | INTRAVENOUS | Status: DC | PRN

## 2022-12-07 SURGICAL SUPPLY — 4 items
CATH BALLN WEDGE 5F 110CM (CATHETERS) IMPLANT
GUIDEWIRE .025 260CM (WIRE) IMPLANT
PACK CARDIAC CATHETERIZATION (CUSTOM PROCEDURE TRAY) IMPLANT
SHEATH GLIDE SLENDER 4/5FR (SHEATH) IMPLANT

## 2022-12-07 NOTE — Evaluation (Signed)
Occupational Therapy Evaluation Patient Details Name: [name] Rice MRN: [id number] DOB: [birth year] Today's Date: 12/07/2022   History of Present Illness 72 yo male presents to ED on 1/28 with SOB, chest congestion x2 weeks. Admitted with acute on chronic diastolic CHF, afib with symptomatic bradycardia. PMH includes anemia, A. fib, HTN, CKD, HF, CAD s/p CABG, LBBB.   Clinical Impression   Patient admitted for above and presents with problem list below.  Patient reports independent with mobility since admission, remains limited by SOB with activity.  Reviewed energy conservation techniques and recommendations, provided handout.  Encouraged pt to consider chair for shower, but pt hesitant.  Pt thankful for education and handout.  No further OT needs identified and OT will sign off.      Recommendations for follow up therapy are one component of a multi-disciplinary discharge planning process, led by the attending physician.  Recommendations may be updated based on patient status, additional functional criteria and insurance authorization.   Follow Up Recommendations  No OT follow up     Assistance Recommended at Discharge None  Patient can return home with the following      Functional Status Assessment     Equipment Recommendations  None recommended by OT    Recommendations for Other Services       Precautions / Restrictions Precautions Precautions: None Restrictions Weight Bearing Restrictions: No      Mobility Bed Mobility Overal bed mobility: Independent                  Transfers Overall transfer level: Independent                        Balance                                           ADL either performed or assessed with clinical judgement   ADL Overall ADL's : Independent                                             Vision   Vision Assessment?: No apparent visual deficits     Perception      Praxis      Pertinent Vitals/Pain Pain Assessment Pain Assessment: No/denies pain     Hand Dominance Right   Extremity/Trunk Assessment Upper Extremity Assessment Upper Extremity Assessment: Overall WFL for tasks assessed   Lower Extremity Assessment Lower Extremity Assessment: Overall WFL for tasks assessed       Communication Communication Communication: No difficulties   Cognition Arousal/Alertness: Awake/alert Behavior During Therapy: WFL for tasks assessed/performed Overall Cognitive Status: Within Functional Limits for tasks assessed                                       General Comments  provided handout for energy conservation and reviewed recommendations    Exercises     Shoulder Instructions      Home Living Family/patient expects to be discharged to:: Private residence Living Arrangements: Alone Available Help at Discharge: Family;Friend(s);Available PRN/intermittently Type of Home: House Home Access: Level entry     Home Layout: One level     Bathroom Shower/Tub:  Walk-in shower   Bathroom Toilet: Standard     Home Equipment: Rollator (4 wheels);Grab bars - tub/shower;Cane - single point          Prior Functioning/Environment Prior Level of Function : Independent/Modified Independent;Driving                        OT Problem List:        OT Treatment/Interventions:      OT Goals(Current goals can be found in the care plan section) Acute Rehab OT Goals Patient Stated Goal: less SOB OT Goal Formulation: With patient  OT Frequency:      Co-evaluation              AM-PAC OT "6 Clicks" Daily Activity     Outcome Measure Help from another person eating meals?: None Help from another person taking care of personal grooming?: None Help from another person toileting, which includes using toliet, bedpan, or urinal?: None Help from another person bathing (including washing, rinsing, drying)?: None Help from  another person to put on and taking off regular upper body clothing?: None Help from another person to put on and taking off regular lower body clothing?: None 6 Click Score: 24   End of Session Nurse Communication: Mobility status  Activity Tolerance: Patient tolerated treatment well Patient left: in bed;with call bell/phone within reach  OT Visit Diagnosis: Other abnormalities of gait and mobility (R26.89)                Time: 9381-0175 OT Time Calculation (min): 16 min Charges:  OT General Charges $OT Visit: 1 Visit OT Evaluation $OT Eval Low Complexity: 1 Low  [name] Rice, OT Acute Rehabilitation Services Office [phone]   [name] Rice 12/07/2022, 11:04 AM

## 2022-12-07 NOTE — Progress Notes (Signed)
Progress Note   Patient: [name] Rice BLT:[id number] DOB: 18-Oct-1951 DOA: 12/05/2022     1 DOS: the patient was seen and examined on 12/07/2022   Brief hospital course: Mr. Schloemer was admitted to the hospital with the working diagnosis of heart failure exacerbation.   72 yo male with the past medical history of atrial fibrillation, diastolic heart failure, coronary artery disease sp CABG, pleural effusion sp VATS, hypertension and dyslipidemia who presented with dyspnea. Reported dyspnea on exertion and orthopnea for about 2 weeks, he was evaluated by his primary care 7 days ago and he was advised to got to the ED. He decided not to go to the ED and waited 7 days more before coming to the ED. On his initial physical examination his blood pressure 166/73, HR 52, RR 23 and 02 saturation 96%, lungs with no rhonchi or rales, mild wheezing, heart with S1 and S2 present irregularly irregular, abdomen with no distention, trace lower extremity edema.   Na 135, K 3,5 CL 104 bicarbonate 23, glucose 98, bun 17 cr 1,30  BNP 1,483  Wbc 6,0 hgb 9,4 plt 254  Sars covid 19 negative, influenza negative   Chest radiograph with mild cardiomegaly, bilateral hilar vascular congestion and mild cephalization of the vasculature.   EKG 45 bpm, right axis deviation, left bundle branch block, atrial fibrillation rhythm with q wave lead I, AvL, V1 to V3, with no significant ST segment or T wave changes.   Patient was placed on IV furosemide for diuresis.  Cardiology was consulted with plans for right heart catheterization.       Assessment and Plan: * Chronic heart failure with preserved ejection fraction (HFpEF) ([place]) 2021 echocardiogram with mild reduction in LV systolic function 45 to 00%.  Pending new echocardiogram report.   Urine output 2,750 ml and he has lost 2 kg since admission with improvement of his symptoms but not back to baseline.   Plan for right heart catheterization today.  Continue medical  therapy with metoprolol, dapagliflozin, entresto and spironlactone. Torsemide 10 mg daily. Had one dose of IV furosemide today 40 mg.   Atrial fibrillation, chronic (HCC) Continue rate control with metoprolol and anticoagulation with rivaroxaban. Continue telemetry, monitor for possible bradycardia.   Pure hypercholesterolemia Continue with rosuvastatin.   CAD, multiple vessel No chest pain, troponin elevation due to heart failure exacerbation.  No acute coronary syndrome.   Essential hypertension Continue blood pressure control with metoprolol and entresto.   CKD (chronic kidney disease) stage 2, GFR 60-89 ml/min Hypokalemia  Renal function with serum cr at 1,21 with K at 3,1 and serum bicarbonate at 23.  Plan to continue K correction with Kck 80 meq in 2 divided doses today and follow up renal function in am. Continue diuresis.         Subjective: Patient is feeling better but continue to have dyspnea on exertion, no chest pain or edema   Physical Exam: Vitals:   12/06/22 1710 12/06/22 1930 12/07/22 0413 12/07/22 0842  BP: (!) 147/68 (!) 111/96 139/61 120/60  Pulse: 60 62 (!) 52 70  Resp: 18 18 18 18   Temp: 98 F (36.7 C) 97.8 F (36.6 C) 97.8 F (36.6 C) 97.8 F (36.6 C)  TempSrc: Oral Oral Oral Oral  SpO2: 99% 93% 93% 99%  Weight:   109 kg   Height:       Neurology awake and alert ENT with mild pallor Cardiovascular with S1 and S2 present, irregularly irregular with no gallops,  rubs or murmurs Respiratory with mild wheezing at expiration with no rhonchi Abdomen with no distention No lower extremity edema  Data Reviewed:    Family Communication: no family at the bedside   Disposition: Status is: Inpatient Remains inpatient appropriate because: right heart catheterization   Planned Discharge Destination: Home      Author: Tawni Millers, MD 12/07/2022 11:27 AM  For on call review [email or web address].

## 2022-12-07 NOTE — H&P (View-Only) (Signed)
Subjective:  Still has dyspnea  Patient asked me if he could walk with me in the hallway.  He got very short of breath by the time he reached the end of the holiday.  His O2 sats on resting were still fairly good at 98%.  Patient tells me that he has not felt well in the last 12 years, and has not been able to read well enough to  "do anything"    Current Facility-Administered Medications:    acetaminophen (TYLENOL) tablet 650 mg, 650 mg, Oral, Q6H PRN **OR** acetaminophen (TYLENOL) suppository 650 mg, 650 mg, Rectal, Q6H PRN, Karmen Bongo, MD   aspirin EC tablet 81 mg, 81 mg, Oral, Ivery Quale, MD, 81 mg at 12/06/22 2258   bisacodyl (DULCOLAX) EC tablet 5 mg, 5 mg, Oral, Daily PRN, Karmen Bongo, MD   dapagliflozin propanediol (FARXIGA) tablet 10 mg, 10 mg, Oral, Daily, Tolia, Sunit, DO, 10 mg at 12/07/22 0850   docusate sodium (COLACE) capsule 100 mg, 100 mg, Oral, BID, Karmen Bongo, MD   hydrALAZINE (APRESOLINE) injection 5 mg, 5 mg, Intravenous, Q4H PRN, Karmen Bongo, MD   metoprolol succinate (TOPROL-XL) 24 hr tablet 12.5 mg, 12.5 mg, Oral, BH-q7a, Tolia, Sunit, DO   ondansetron (ZOFRAN) tablet 4 mg, 4 mg, Oral, Q6H PRN **OR** ondansetron (ZOFRAN) injection 4 mg, 4 mg, Intravenous, Q6H PRN, Karmen Bongo, MD   oxyCODONE (Oxy IR/ROXICODONE) immediate release tablet 5 mg, 5 mg, Oral, Q4H PRN, Karmen Bongo, MD   polyethylene glycol (MIRALAX / GLYCOLAX) packet 17 g, 17 g, Oral, Daily PRN, Karmen Bongo, MD   rivaroxaban Alveda Reasons) tablet 20 mg, 20 mg, Oral, Q supper, Karmen Bongo, MD, 20 mg at 12/06/22 1707   rosuvastatin (CRESTOR) tablet 20 mg, 20 mg, Oral, QHS, Karmen Bongo, MD, 20 mg at 12/06/22 2258   sacubitril-valsartan (ENTRESTO) 24-26 mg per tablet, 1 tablet, Oral, BID, Karmen Bongo, MD, 1 tablet at 12/07/22 0850   sodium chloride flush (NS) 0.9 % injection 3 mL, 3 mL, Intravenous, Q12H, Karmen Bongo, MD, 3 mL at 12/07/22 0852   torsemide (DEMADEX)  tablet 10 mg, 10 mg, Oral, Daily, Tolia, Sunit, DO, 10 mg at 12/07/22 0850   traZODone (DESYREL) tablet 25 mg, 25 mg, Oral, QHS PRN, Karmen Bongo, MD   Objective:  Vital Signs in the last 24 hours: Temp:  [97.5 F (36.4 C)-98 F (36.7 C)] 97.8 F (36.6 C) (01/30 0842) Pulse Rate:  [52-72] 70 (01/30 0842) Resp:  [16-18] 18 (01/30 0842) BP: (111-148)/(60-96) 120/60 (01/30 0842) SpO2:  [93 %-99 %] 99 % (01/30 0842) Weight:  [109 kg-109.6 kg] 109 kg (01/30 0413)  Intake/Output from previous day: 01/29 0701 - 01/30 0700 In: 35 [P.O.:480] Out: -   Physical Exam Vitals and nursing note reviewed.  Constitutional:      General: He is not in acute distress. Neck:     Vascular: No JVD.  Cardiovascular:     Rate and Rhythm: Normal rate and regular rhythm.     Heart sounds: Normal heart sounds. No murmur heard. Pulmonary:     Effort: Pulmonary effort is normal.     Breath sounds: Examination of the right-lower field reveals rhonchi. Examination of the left-lower field reveals rhonchi. Rhonchi present. No wheezing or rales.  Musculoskeletal:     Right lower leg: Edema (Trace) present.     Left lower leg: Edema (Trace) present.      Imaging/tests reviewed and independently interpreted:  CXR 12/05/2022: Cardiac shadow is stable. Postsurgical changes  are again seen. Lungs are well aerated bilaterally. Mild scarring in the left base is seen and stable. No bony abnormality is noted.   IMPRESSION: No active disease.    Cardiac Studies:  Telemetry 12/07/2022: Afib, rate controlled Underlying IVCD  EKG 12/05/2022: Afib 45 bpm IVCD  Echocardiogram 06/23/2022: Normal LV systolic function with visual EF 60-65%. Left ventricle cavity is normal in size. Moderate concentric hypertrophy of the left ventricle. Normal global wall motion. Indeterminate diastolic filling pattern, elevated LAP. Calculated EF 62%. Left atrial cavity is severely dilated at 4.7 cm. Right atrial cavity  is severely dilated. Right ventricle cavity is moderately dilated. Normal right ventricular function. Structurally normal tricuspid valve.  Moderate tricuspid regurgitation. Mild pulmonary hypertension. RVSP measures 36 mmHg. IVC is dilated with respiratory variation.  Left Heart Catheterization 07/25/2020:  RCA: Proximal RCA 80% stenosis, large vessel with mild disease in PL and PDA branches.  A secondary PL branch is subtotally occluded. LM: Distal 30-40% stenosis. LAD: LAD diffusely diseased, proximal diffuse 80% followed by tandem 90% stenosis.  Large D1 with moderate diffuse disease and proximal and mid tandem 70 and 80% stenosis.  Has secondary branches and is tortuous. Cx  and RI: Co-dominant Cx with Ostial circumflex 99% involving a moderate sized ramus with ostial 80 to 90% and proximal 90% stenosis. LV: Normal LVEDP.  No pressure gradient across the aortic valve. Patent LIMA and RIMA and RIMA has ostial 20-30% stenosis. Subclavian arteries widely patent.  Right radial diffusely disease by [place]. 58mL contrast used.    Recommendation: Patient needs evaluation for inpatient CABG in view of NSVT, ventricular standstill.  Radial arterial conduits cannot be utilized as there was significant amount of atherosclerosis evident with ultrasound guidance.  Will discuss with surgery.  We could still consider Maze procedure and left atrial appendage ligation.      Assessment & Recommendations:  72 y/o Caucasian male with hypertension, hyperlipidemia, multivessel CAD s/p CABGX4 (LIMA-distal LAD, RIMA-PDA, seq LRA-OM+ramus) w/ Maze and LAA clipping (2021), persistent Afib, recurrent left pleural effusion requiring VATS for decortication (2022), mild PH, admitted with suspected acute on chronic HFpEF  Acute on chronic HFpEF: Chronic, worsening, NYHA class II/III. EF was 45-50% in 2021, since recovered. Physical exam and echocardiogram are underwhelming, but he is very symptomatic with exertional  dyspnea with minimal activity, and elevated BNP. Patient has frustrated that he has not been able to breathe well ever since his bypass surgery. I recommend right heart catheterization for invasive hemodynamic assessment. If filling pressures are not significantly elevated, may need to look at alternate causes for his dyspnea. Added spironolactone 12.5 mg daily. Will give one dose of IV lasix 40 mg today. Strict I/O. Continue Entresto 24-26 mg bid, Farxiga 10 mg daily, metoprolol succinate 12.5 mg daily.  CAD: S/p CABG 2021. No anginal symptoms.   Consider stopping aspirin given ongoing Xarelto use (Will defer to Dr. Terri Skains outpatient). Continue statin  Persistent A-fib: Longstanding, rate controlled. High CHA2DS2VASc score. Continue Xarelto 20 mg daily.   Discussed interpretation of tests and management recommendations with the primary team   Nigel Mormon, MD Pager: [phone] Office: [phone]

## 2022-12-07 NOTE — Interval H&P Note (Signed)
History and Physical Interval Note:  12/07/2022 4:00 PM  [name] Rice  has presented today for surgery, with the diagnosis of heart failure.  The various methods of treatment have been discussed with the patient and family. After consideration of risks, benefits and other options for treatment, the patient has consented to  Procedure(s): RIGHT HEART CATH (N/A) as a surgical intervention.  The patient's history has been reviewed, patient examined, no change in status, stable for surgery.  I have reviewed the patient's chart and labs.  Questions were answered to the patient's satisfaction.    2012 Appropriate Use Criteria for Diagnostic Catheterization Cardiomyopathies (Right and Left Heart Catheterization OR Right Heart Catheterization Alone With/Without Left Ventriculography and Coronary Angiography) Indication:  Known or suspected cardiomyopathy with or without heart failure A (7) Indication: 93; Score 7 291  [name] J Garin Mata

## 2022-12-07 NOTE — Progress Notes (Signed)
Subjective:  Still has dyspnea  Patient asked me if he could walk with me in the hallway.  He got very short of breath by the time he reached the end of the holiday.  His O2 sats on resting were still fairly good at 98%.  Patient tells me that he has not felt well in the last 12 years, and has not been able to read well enough to  "do anything"    Current Facility-Administered Medications:    acetaminophen (TYLENOL) tablet 650 mg, 650 mg, Oral, Q6H PRN **OR** acetaminophen (TYLENOL) suppository 650 mg, 650 mg, Rectal, Q6H PRN, Karmen Bongo, MD   aspirin EC tablet 81 mg, 81 mg, Oral, Ivery Quale, MD, 81 mg at 12/06/22 2258   bisacodyl (DULCOLAX) EC tablet 5 mg, 5 mg, Oral, Daily PRN, Karmen Bongo, MD   dapagliflozin propanediol (FARXIGA) tablet 10 mg, 10 mg, Oral, Daily, Tolia, Sunit, DO, 10 mg at 12/07/22 0850   docusate sodium (COLACE) capsule 100 mg, 100 mg, Oral, BID, Karmen Bongo, MD   hydrALAZINE (APRESOLINE) injection 5 mg, 5 mg, Intravenous, Q4H PRN, Karmen Bongo, MD   metoprolol succinate (TOPROL-XL) 24 hr tablet 12.5 mg, 12.5 mg, Oral, BH-q7a, Tolia, Sunit, DO   ondansetron (ZOFRAN) tablet 4 mg, 4 mg, Oral, Q6H PRN **OR** ondansetron (ZOFRAN) injection 4 mg, 4 mg, Intravenous, Q6H PRN, Karmen Bongo, MD   oxyCODONE (Oxy IR/ROXICODONE) immediate release tablet 5 mg, 5 mg, Oral, Q4H PRN, Karmen Bongo, MD   polyethylene glycol (MIRALAX / GLYCOLAX) packet 17 g, 17 g, Oral, Daily PRN, Karmen Bongo, MD   rivaroxaban Alveda Reasons) tablet 20 mg, 20 mg, Oral, Q supper, Karmen Bongo, MD, 20 mg at 12/06/22 1707   rosuvastatin (CRESTOR) tablet 20 mg, 20 mg, Oral, QHS, Karmen Bongo, MD, 20 mg at 12/06/22 2258   sacubitril-valsartan (ENTRESTO) 24-26 mg per tablet, 1 tablet, Oral, BID, Karmen Bongo, MD, 1 tablet at 12/07/22 0850   sodium chloride flush (NS) 0.9 % injection 3 mL, 3 mL, Intravenous, Q12H, Karmen Bongo, MD, 3 mL at 12/07/22 0852   torsemide (DEMADEX)  tablet 10 mg, 10 mg, Oral, Daily, Tolia, Sunit, DO, 10 mg at 12/07/22 0850   traZODone (DESYREL) tablet 25 mg, 25 mg, Oral, QHS PRN, Karmen Bongo, MD   Objective:  Vital Signs in the last 24 hours: Temp:  [97.5 F (36.4 C)-98 F (36.7 C)] 97.8 F (36.6 C) (01/30 0842) Pulse Rate:  [52-72] 70 (01/30 0842) Resp:  [16-18] 18 (01/30 0842) BP: (111-148)/(60-96) 120/60 (01/30 0842) SpO2:  [93 %-99 %] 99 % (01/30 0842) Weight:  [109 kg-109.6 kg] 109 kg (01/30 0413)  Intake/Output from previous day: 01/29 0701 - 01/30 0700 In: 35 [P.O.:480] Out: -   Physical Exam Vitals and nursing note reviewed.  Constitutional:      General: He is not in acute distress. Neck:     Vascular: No JVD.  Cardiovascular:     Rate and Rhythm: Normal rate and regular rhythm.     Heart sounds: Normal heart sounds. No murmur heard. Pulmonary:     Effort: Pulmonary effort is normal.     Breath sounds: Examination of the right-lower field reveals rhonchi. Examination of the left-lower field reveals rhonchi. Rhonchi present. No wheezing or rales.  Musculoskeletal:     Right lower leg: Edema (Trace) present.     Left lower leg: Edema (Trace) present.      Imaging/tests reviewed and independently interpreted:  CXR 12/05/2022: Cardiac shadow is stable. Postsurgical changes  are again seen. Lungs are well aerated bilaterally. Mild scarring in the left base is seen and stable. No bony abnormality is noted.   IMPRESSION: No active disease.    Cardiac Studies:  Telemetry 12/07/2022: Afib, rate controlled Underlying IVCD  EKG 12/05/2022: Afib 45 bpm IVCD  Echocardiogram 06/23/2022: Normal LV systolic function with visual EF 60-65%. Left ventricle cavity is normal in size. Moderate concentric hypertrophy of the left ventricle. Normal global wall motion. Indeterminate diastolic filling pattern, elevated LAP. Calculated EF 62%. Left atrial cavity is severely dilated at 4.7 cm. Right atrial cavity  is severely dilated. Right ventricle cavity is moderately dilated. Normal right ventricular function. Structurally normal tricuspid valve.  Moderate tricuspid regurgitation. Mild pulmonary hypertension. RVSP measures 36 mmHg. IVC is dilated with respiratory variation.  Left Heart Catheterization 07/25/2020:  RCA: Proximal RCA 80% stenosis, large vessel with mild disease in PL and PDA branches.  A secondary PL branch is subtotally occluded. LM: Distal 30-40% stenosis. LAD: LAD diffusely diseased, proximal diffuse 80% followed by tandem 90% stenosis.  Large D1 with moderate diffuse disease and proximal and mid tandem 70 and 80% stenosis.  Has secondary branches and is tortuous. Cx  and RI: Co-dominant Cx with Ostial circumflex 99% involving a moderate sized ramus with ostial 80 to 90% and proximal 90% stenosis. LV: Normal LVEDP.  No pressure gradient across the aortic valve. Patent LIMA and RIMA and RIMA has ostial 20-30% stenosis. Subclavian arteries widely patent.  Right radial diffusely disease by [place]. 51mL contrast used.    Recommendation: Patient needs evaluation for inpatient CABG in view of NSVT, ventricular standstill.  Radial arterial conduits cannot be utilized as there was significant amount of atherosclerosis evident with ultrasound guidance.  Will discuss with surgery.  We could still consider Maze procedure and left atrial appendage ligation.      Assessment & Recommendations:  72 y/o Caucasian male with hypertension, hyperlipidemia, multivessel CAD s/p CABGX4 (LIMA-distal LAD, RIMA-PDA, seq LRA-OM+ramus) w/ Maze and LAA clipping (2021), persistent Afib, recurrent left pleural effusion requiring VATS for decortication (2022), mild PH, admitted with suspected acute on chronic HFpEF  Acute on chronic HFpEF: Chronic, worsening, NYHA class II/III. EF was 45-50% in 2021, since recovered. Physical exam and echocardiogram are underwhelming, but he is very symptomatic with exertional  dyspnea with minimal activity, and elevated BNP. Patient has frustrated that he has not been able to breathe well ever since his bypass surgery. I recommend right heart catheterization for invasive hemodynamic assessment. If filling pressures are not significantly elevated, may need to look at alternate causes for his dyspnea. Added spironolactone 12.5 mg daily. Will give one dose of IV lasix 40 mg today. Strict I/O. Continue Entresto 24-26 mg bid, Farxiga 10 mg daily, metoprolol succinate 12.5 mg daily.  CAD: S/p CABG 2021. No anginal symptoms.   Consider stopping aspirin given ongoing Xarelto use (Will defer to Dr. Terri Skains outpatient). Continue statin  Persistent A-fib: Longstanding, rate controlled. High CHA2DS2VASc score. Continue Xarelto 20 mg daily.   Discussed interpretation of tests and management recommendations with the primary team   Nigel Mormon, MD Pager: [phone] Office: [phone]

## 2022-12-07 NOTE — Assessment & Plan Note (Signed)
No chest pain, troponin elevation due to heart failure exacerbation.  No acute coronary syndrome.

## 2022-12-07 NOTE — Progress Notes (Signed)
PROGRESS NOTE    [name] Rice  BJS:[id number] DOB: 12-23-1950 DOA: 12/05/2022 PCP: Pcp, No  72/M w/ chronic.atrial fibrillation, diastolic CHF, coronary artery disease sp CABG, pleural effusion sp VATS, hypertension and dyslipidemia who presented with dyspnea. Reported dyspnea on exertion and orthopnea for about 2 weeks, he was evaluated by his primary care 7 days ago and he was advised to got to the ED.  cr 1,30, BNP 1,483, CXR w/Chest radiograph with mild cardiomegaly, bilateral hilar vascular congestion and mild cephalization of the vasculature.  Patient was placed on IV furosemide for diuresis.  Cardiology  consulted RHC    Subjective:   Assessment and Plan:  Acute on chronic diastolic CHF 6073 echo with mild reduction in LV systolic function 45 to 71%.  -repeat ECHo w/ EF 60%, mildly reduced RV -diuresing on IV lasix, 2L negative -continue IV Lasix, metoprolol, dapagliflozin, entresto and spironlactone. -RHC w.RA: 7 mmHg RV: 72/2 mmHg PA: 74/9 mmHg, mPAP 30 mmHg PCW: 19 mmHg,   Atrial fibrillation, chronic (HCC) Continue ,metoprolol and xarelto  Pure hypercholesterolemia Continue rosuvastatin.   CAD, CABG -troponin elevation due to heart failure exacerbation.  No ACS  Essential hypertension Continue metoprolol and entresto.   CKD (chronic kidney disease) stage 2, GFR 60-89 ml/min Hypokalemia -stable, monitor with diuresis    DVT prophylaxis: Xarelto Code Status: Full Code Family Communication: Disposition Plan:   Consultants:    Procedures:   Antimicrobials:    Objective: Vitals:   12/07/22 1617 12/07/22 1622 12/07/22 1627 12/07/22 1926  BP: (!) 134/52 (!) 132/59 (!) 148/66 105/61  Pulse: 74 71 63 62  Resp: (!) 9 14 20 20   Temp:    97.8 F (36.6 C)  TempSrc:    Oral  SpO2: 98% 99% 96%   Weight:      Height:        Intake/Output Summary (Last 24 hours) at 12/07/2022 1958 Last data filed at 12/07/2022 1200 Gross per 24 hour  Intake 477 ml   Output 600 ml  Net -123 ml   Filed Weights   12/05/22 1708 12/06/22 1000 12/07/22 0413  Weight: 111.1 kg 109.6 kg 109 kg    Examination:     Data Reviewed:   CBC: Recent Labs  Lab 12/05/22 1811 12/07/22 0135 12/07/22 1622  WBC 6.0 5.2  --   NEUTROABS 4.0  --   --   HGB 9.4* 8.6* 10.9*  10.9*  HCT 33.5* 32.1* 32.0*  32.0*  MCV 74.9* 76.4*  --   PLT 254 247  --    Basic Metabolic Panel: Recent Labs  Lab 12/05/22 1811 12/07/22 0135 12/07/22 1622  NA 135 137 142  142  K 3.5 3.1* 3.3*  3.3*  CL 104 103  --   CO2 23 23  --   GLUCOSE 98 99  --   BUN 17 13  --   CREATININE 1.30* 1.21  --   CALCIUM 8.1* 8.3*  --   MG 2.3  --   --    GFR: Estimated Creatinine Clearance: 74.6 mL/min (by C-G formula based on SCr of 1.21 mg/dL). Liver Function Tests: Recent Labs  Lab 12/05/22 1811  AST 18  ALT 7  ALKPHOS 113  BILITOT 2.6*  PROT 7.1  ALBUMIN 3.6   No results for input(s): "LIPASE", "AMYLASE" in the last 168 hours. No results for input(s): "AMMONIA" in the last 168 hours. Coagulation Profile: No results for input(s): "INR", "PROTIME" in the last 168 hours. Cardiac Enzymes: No  results for input(s): "CKTOTAL", "CKMB", "CKMBINDEX", "TROPONINI" in the last 168 hours. BNP (last 3 results) Recent Labs    06/23/22 1148 07/09/22 1049 09/02/22 0958  PROBNP 7,015* 4,649* 3,271*   HbA1C: No results for input(s): "HGBA1C" in the last 72 hours. CBG: No results for input(s): "GLUCAP" in the last 168 hours. Lipid Profile: Recent Labs    12/07/22 0135  CHOL 82  HDL 32*  LDLCALC 39  TRIG 56  CHOLHDL 2.6  LDLDIRECT 38   Thyroid Function Tests: No results for input(s): "TSH", "T4TOTAL", "FREET4", "T3FREE", "THYROIDAB" in the last 72 hours. Anemia Panel: No results for input(s): "VITAMINB12", "FOLATE", "FERRITIN", "TIBC", "IRON", "RETICCTPCT" in the last 72 hours. Urine analysis:    Component Value Date/Time   COLORURINE YELLOW 09/23/2020 2135    APPEARANCEUR HAZY (A) 09/23/2020 2135   LABSPEC 1.041 (H) 09/23/2020 2135   PHURINE 5.0 09/23/2020 2135   GLUCOSEU NEGATIVE 09/23/2020 2135   HGBUR LARGE (A) 09/23/2020 2135   BILIRUBINUR NEGATIVE 09/23/2020 2135   KETONESUR 5 (A) 09/23/2020 2135   PROTEINUR 30 (A) 09/23/2020 2135   NITRITE NEGATIVE 09/23/2020 2135   LEUKOCYTESUR TRACE (A) 09/23/2020 2135   Sepsis Labs: @LABRCNTIP (procalcitonin:4,lacticidven:4)  ) Recent Results (from the past 240 hour(s))  Resp panel by RT-PCR (RSV, Flu A&B, Covid) Nasopharyngeal Swab     Status: None   Collection Time: 12/05/22  5:11 PM   Specimen: Nasopharyngeal Swab; Nasal Swab  Result Value Ref Range Status   SARS Coronavirus 2 by RT PCR NEGATIVE NEGATIVE Final    Comment: (NOTE) SARS-CoV-2 target nucleic acids are NOT DETECTED.  The SARS-CoV-2 RNA is generally detectable in upper respiratory specimens during the acute phase of infection. The lowest concentration of SARS-CoV-2 viral copies this assay can detect is 138 copies/mL. A negative result does not preclude SARS-Cov-2 infection and should not be used as the sole basis for treatment or other patient management decisions. A negative result may occur with  improper specimen collection/handling, submission of specimen other than nasopharyngeal swab, presence of viral mutation(s) within the areas targeted by this assay, and inadequate number of viral copies(<138 copies/mL). A negative result must be combined with clinical observations, patient history, and epidemiological information. The expected result is Negative.  Fact Sheet for Patients:  EntrepreneurPulse.com.au  Fact Sheet for Healthcare Providers:  IncredibleEmployment.be  This test is no t yet approved or cleared by the [place] FDA and  has been authorized for detection and/or diagnosis of SARS-CoV-2 by FDA under an Emergency Use Authorization (EUA). This EUA will remain  in effect  (meaning this test can be used) for the duration of the COVID-19 declaration under Section 564(b)(1) of the Act, 21 U.S.C.section 360bbb-3(b)(1), unless the authorization is terminated  or revoked sooner.       Influenza A by PCR NEGATIVE NEGATIVE Final   Influenza B by PCR NEGATIVE NEGATIVE Final    Comment: (NOTE) The Xpert Xpress SARS-CoV-2/FLU/RSV plus assay is intended as an aid in the diagnosis of influenza from Nasopharyngeal swab specimens and should not be used as a sole basis for treatment. Nasal washings and aspirates are unacceptable for Xpert Xpress SARS-CoV-2/FLU/RSV testing.  Fact Sheet for Patients: EntrepreneurPulse.com.au  Fact Sheet for Healthcare Providers: IncredibleEmployment.be  This test is not yet approved or cleared by the [place] FDA and has been authorized for detection and/or diagnosis of SARS-CoV-2 by FDA under an Emergency Use Authorization (EUA). This EUA will remain in effect (meaning this test can be used) for the  duration of the COVID-19 declaration under Section 564(b)(1) of the Act, 21 U.S.C. section 360bbb-3(b)(1), unless the authorization is terminated or revoked.     Resp Syncytial Virus by PCR NEGATIVE NEGATIVE Final    Comment: (NOTE) Fact Sheet for Patients: BloggerCourse.com  Fact Sheet for Healthcare Providers: SeriousBroker.it  This test is not yet approved or cleared by the [place] FDA and has been authorized for detection and/or diagnosis of SARS-CoV-2 by FDA under an Emergency Use Authorization (EUA). This EUA will remain in effect (meaning this test can be used) for the duration of the COVID-19 declaration under Section 564(b)(1) of the Act, 21 U.S.C. section 360bbb-3(b)(1), unless the authorization is terminated or revoked.  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 Recommend repeat echocardiogram to evaluate for any presence of MR (tall "v" waves can be seen in diastolic heart failure or mitral regurgitation). After aggressive diuresis, if symptoms remain, could consider repeating RHC to r/o any other forms of PH-especially Grp I. That said, Grp 1 PH/PAH is quite unlikely in a patient with biatrial dilatation with longstanding Afib and CAD. Recommend IV lasix 40 mg bid for next 1-2 days. Hold PO torsemide for now. [name], MD Pager: [phone] Office: [phone]  ECHOCARDIOGRAM LIMITED  Result Date: 12/07/2022    ECHOCARDIOGRAM LIMITED REPORT   Patient Name:   [name] Rice Date of Exam: 12/07/2022 Medical Rec #:  [id number]      Height:       73.0 in Accession #:    [id number]     Weight:       240.3 lb Date of Birth:  Jul 17, 1951      BSA:          2.326 m Patient Age:    71 years       BP:           120/60 mmHg Patient Gender: M              HR:           65 bpm. Exam Location:  Inpatient Procedure: Limited Echo and Limited Color Doppler Indications:    Dyspnea R06.00  History:        Patient has prior history of Echocardiogram examinations, most                 recent 09/27/2020. CHF, CAD, Prior CABG, Arrythmias:Atrial                 Fibrillation, Bradycardia and LBBB, Signs/Symptoms:Dyspnea; Risk                 Factors:Hypertension. CKD, stage III.  Sonographer:    Lucendia Herrlich Referring Phys: [id number] SUNIT TOLIA IMPRESSIONS  1. Left ventricular ejection fraction, by estimation, is 60 to 65%. The left ventricle has normal function. The left ventricle has no regional wall motion abnormalities. Left ventricular diastolic function could not be evaluated.  2. Right  ventricular systolic function is mildly reduced. The right ventricular size is normal. There is mildly elevated pulmonary artery systolic pressure. The estimated right ventricular systolic pressure is 37.1 mmHg.  3. The mitral valve is degenerative. Trivial mitral  valve regurgitation. No evidence of mitral stenosis. Moderate mitral annular calcification.  4. The aortic valve is calcified. Aortic valve regurgitation is not visualized. Aortic valve sclerosis is present, with no evidence of aortic valve stenosis.  5. The inferior vena cava is normal in size with greater than 50% respiratory variability, suggesting right atrial pressure of 3 mmHg. Comparison(s): No significant change from prior study. FINDINGS  Left Ventricle: Left ventricular ejection fraction, by estimation, is 60 to 65%. The left ventricle has normal function. The left ventricle has no regional wall motion abnormalities. The left ventricular internal cavity size was normal in size. There is  no left ventricular hypertrophy. Abnormal (paradoxical) septal motion consistent with post-operative status. Left ventricular diastolic function could not be evaluated. Right Ventricle: The right ventricular size is normal. No increase in right ventricular wall thickness. Right ventricular systolic function is mildly reduced. There is mildly elevated pulmonary artery systolic pressure. The tricuspid regurgitant velocity  is 2.92 m/s, and with an assumed right atrial pressure of 3 mmHg, the estimated right ventricular systolic pressure is 69.6 mmHg. Pericardium: There is no evidence of pericardial effusion. Mitral Valve: The mitral valve is degenerative in appearance. Moderate mitral annular calcification. Trivial mitral valve regurgitation. No evidence of mitral valve stenosis. Tricuspid Valve: The tricuspid valve is grossly normal. Tricuspid valve regurgitation is trivial. No evidence of tricuspid stenosis. Aortic Valve: The aortic valve is calcified. Aortic  valve regurgitation is not visualized. Aortic valve sclerosis is present, with no evidence of aortic valve stenosis. Aorta: The aortic root and ascending aorta are structurally normal, with no evidence of dilitation. Venous: The inferior vena cava is normal in size with greater than 50% respiratory variability, suggesting right atrial pressure of 3 mmHg. LEFT VENTRICLE PLAX 2D LVIDd:         6.40 cm LVIDs:         4.70 cm LV PW:         0.90 cm LV IVS:        1.10 cm LVOT diam:     2.10 cm LVOT Area:     3.46 cm  IVC IVC diam: 1.60 cm LEFT ATRIUM         Index LA diam:    5.50 cm 2.36 cm/m   AORTA Ao Root diam: 3.50 cm Ao Asc diam:  3.40 cm TRICUSPID VALVE TR Peak grad:   34.1 mmHg TR Vmax:        292.00 cm/s  SHUNTS Systemic Diam: 2.10 cm Eleonore Chiquito MD Electronically signed by Eleonore Chiquito MD Signature Date/Time: 12/07/2022/2:17:17 PM    Final      Scheduled Meds:  aspirin EC  81 mg Oral QHS   dapagliflozin propanediol  10 mg Oral Daily   docusate sodium  100 mg Oral BID   furosemide  40 mg Intravenous Once   furosemide  40 mg Intravenous BID   metoprolol succinate  12.5 mg Oral BH-q7a   rivaroxaban  20 mg Oral Q supper   rosuvastatin  20 mg Oral QHS   sacubitril-valsartan  1 tablet Oral BID   sodium chloride flush  3 mL Intravenous Q12H   sodium chloride flush  3 mL Intravenous Q12H   spironolactone  12.5 mg Oral Daily   Continuous Infusions:  sodium chloride       LOS: 1 day    Time spent: 19min    Domenic Polite, MD [facility]   12/07/2022, 7:58 PM

## 2022-12-07 NOTE — Assessment & Plan Note (Signed)
Continue with rosuvastatin.

## 2022-12-07 NOTE — Progress Notes (Signed)
Echocardiogram 2D Echocardiogram has been performed.  [name] Rice 12/07/2022, 1:03 PM

## 2022-12-07 NOTE — Hospital Course (Signed)
[name] Rice was admitted to the hospital with the working diagnosis of heart failure exacerbation.   72 yo male with the past medical history of atrial fibrillation, diastolic heart failure, coronary artery disease sp CABG, pleural effusion sp VATS, hypertension and dyslipidemia who presented with dyspnea. Reported dyspnea on exertion and orthopnea for about 2 weeks, he was evaluated by his primary care 7 days ago and he was advised to got to the ED. He decided not to go to the ED and waited 7 days more before coming to the ED. On his initial physical examination his blood pressure 166/73, HR 52, RR 23 and 02 saturation 96%, lungs with no rhonchi or rales, mild wheezing, heart with S1 and S2 present irregularly irregular, abdomen with no distention, trace lower extremity edema.   Na 135, K 3,5 CL 104 bicarbonate 23, glucose 98, bun 17 cr 1,30  BNP 1,483  Wbc 6,0 hgb 9,4 plt 254  Sars covid 19 negative, influenza negative   Chest radiograph with mild cardiomegaly, bilateral hilar vascular congestion and mild cephalization of the vasculature.   EKG 45 bpm, right axis deviation, left bundle branch block, atrial fibrillation rhythm with q wave lead I, AvL, V1 to V3, with no significant ST segment or T wave changes.   Patient was placed on IV furosemide for diuresis.  Cardiology was consulted with plans for right heart catheterization.

## 2022-12-07 NOTE — Assessment & Plan Note (Addendum)
2021 echocardiogram with mild reduction in LV systolic function 45 to 58%.  Pending new echocardiogram report.   Urine output 2,750 ml and he has lost 2 kg since admission with improvement of his symptoms but not back to baseline.   Plan for right heart catheterization today.  Continue medical therapy with metoprolol, dapagliflozin, entresto and spironlactone. Torsemide 10 mg daily. Had one dose of IV furosemide today 40 mg.

## 2022-12-07 NOTE — Assessment & Plan Note (Signed)
Continue rate control with metoprolol and anticoagulation with rivaroxaban. Continue telemetry, monitor for possible bradycardia.

## 2022-12-07 NOTE — Assessment & Plan Note (Addendum)
Hypokalemia  Renal function with serum cr at 1,21 with K at 3,1 and serum bicarbonate at 23.  Plan to continue K correction with Kck 80 meq in 2 divided doses today and follow up renal function in am. Continue diuresis.

## 2022-12-07 NOTE — Progress Notes (Signed)
Nutrition Brief Note  Consult received for nutrition goals.   Patient admitted with c/c of ongoing dyspnea with recent treatment for bronchitis. Cardiology plans for RHC today.  Called and spoke with patient via phone call to room. He endorses eating well despite difficulty breathing. He denies any changes to his usual intake currently and PTA. He states that he weighs himself daily and reports to his Cardiologist. States that his weight has remained stable at 240 lbs (109 kg) with no significant loss/gains.   Wt Readings from Last 6 Encounters:  12/07/22 109 kg  06/28/22 112.9 kg  06/08/22 111.1 kg  05/14/22 99.8 kg  04/29/22 99.8 kg  01/04/22 102.9 kg   Body mass index is 31.7 kg/m. Patient meets criteria for obesity based on current BMI. Mild non-pitting edema also documented per nursing which could also cause an increased weight, resulting in an elevated BMI.   Current diet order is Heart Healthy, 1.5L fluid restriction, patient is consuming approximately 100% of meals at this time. Labs and medications reviewed.   No nutrition interventions warranted at this time. If nutrition issues arise, please consult RD.   Clayborne Dana, RDN, LDN Clinical Nutrition

## 2022-12-07 NOTE — Assessment & Plan Note (Signed)
Continue blood pressure monitoring Continue with metoprolol and entresto.  

## 2022-12-08 ENCOUNTER — Encounter (HOSPITAL_COMMUNITY): Payer: Self-pay | Admitting: Cardiology

## 2022-12-08 ENCOUNTER — Inpatient Hospital Stay (HOSPITAL_COMMUNITY): Payer: Medicare Other

## 2022-12-08 DIAGNOSIS — I5032 Chronic diastolic (congestive) heart failure: Secondary | ICD-10-CM | POA: Diagnosis not present

## 2022-12-08 LAB — BASIC METABOLIC PANEL
Anion gap: 11 (ref 5–15)
BUN: 13 mg/dL (ref 8–23)
CO2: 23 mmol/L (ref 22–32)
Calcium: 8.3 mg/dL — ABNORMAL LOW (ref 8.9–10.3)
Chloride: 103 mmol/L (ref 98–111)
Creatinine, Ser: 1.22 mg/dL (ref 0.61–1.24)
GFR, Estimated: >60 mL/min (ref >60)
Glucose, Bld: 99 mg/dL (ref 70–99)
Potassium: 4.1 mmol/L (ref 3.5–5.1)
Sodium: 137 mmol/L (ref 135–145)

## 2022-12-08 LAB — CBC
HCT: 32.9 % — ABNORMAL LOW (ref 39.0–52.0)
Hemoglobin: 9.5 g/dL — ABNORMAL LOW (ref 13.0–17.0)
MCH: 21.8 pg — ABNORMAL LOW (ref 26.0–34.0)
MCHC: 28.9 g/dL — ABNORMAL LOW (ref 30.0–36.0)
MCV: 75.5 fL — ABNORMAL LOW (ref 80.0–100.0)
Platelets: 207 10*3/uL (ref 150–400)
RBC: 4.36 MIL/uL (ref 4.22–5.81)
RDW: 18 % — ABNORMAL HIGH (ref 11.5–15.5)
WBC: 5.2 10*3/uL (ref 4.0–10.5)
nRBC: 0 % (ref 0.0–0.2)

## 2022-12-08 LAB — MAGNESIUM: Magnesium: 2.2 mg/dL (ref 1.7–2.4)

## 2022-12-08 MED ORDER — SPIRONOLACTONE 25 MG PO TABS
25.0000 mg | ORAL_TABLET | Freq: Every day | ORAL | Status: DC
  Administered 2022-12-08: 25 mg via ORAL
  Filled 2022-12-08 (×2): qty 1

## 2022-12-08 NOTE — Plan of Care (Signed)

## 2022-12-08 NOTE — Progress Notes (Signed)
Subjective:  S/p RHC 12/07/2022, details below     Current Facility-Administered Medications:    0.9 %  sodium chloride infusion, 250 mL, Intravenous, PRN, Chrisopher Pustejovsky J, MD   acetaminophen (TYLENOL) tablet 650 mg, 650 mg, Oral, Q6H PRN **OR** acetaminophen (TYLENOL) suppository 650 mg, 650 mg, Rectal, Q6H PRN, Karmen Bongo, MD   acetaminophen (TYLENOL) tablet 650 mg, 650 mg, Oral, Q4H PRN, Mckenzye Cutright J, MD   aspirin EC tablet 81 mg, 81 mg, Oral, QHS, Karmen Bongo, MD, 81 mg at 12/07/22 2242   bisacodyl (DULCOLAX) EC tablet 5 mg, 5 mg, Oral, Daily PRN, Karmen Bongo, MD   dapagliflozin propanediol (FARXIGA) tablet 10 mg, 10 mg, Oral, Daily, Tolia, Sunit, DO, 10 mg at 12/08/22 0902   docusate sodium (COLACE) capsule 100 mg, 100 mg, Oral, BID, Karmen Bongo, MD   furosemide (LASIX) injection 40 mg, 40 mg, Intravenous, Once, Ammaar Encina J, MD   furosemide (LASIX) injection 40 mg, 40 mg, Intravenous, BID, Arihanna Estabrook J, MD, 40 mg at 12/08/22 0902   ondansetron (ZOFRAN) tablet 4 mg, 4 mg, Oral, Q6H PRN **OR** ondansetron (ZOFRAN) injection 4 mg, 4 mg, Intravenous, Q6H PRN, Karmen Bongo, MD   ondansetron (ZOFRAN) injection 4 mg, 4 mg, Intravenous, Q6H PRN, Sunjai Levandoski J, MD   oxyCODONE (Oxy IR/ROXICODONE) immediate release tablet 5 mg, 5 mg, Oral, Q4H PRN, Karmen Bongo, MD   polyethylene glycol (MIRALAX / GLYCOLAX) packet 17 g, 17 g, Oral, Daily PRN, Karmen Bongo, MD   rivaroxaban [facility]) tablet 20 mg, 20 mg, Oral, Q supper, Karmen Bongo, MD, 20 mg at 12/07/22 1803   rosuvastatin (CRESTOR) tablet 20 mg, 20 mg, Oral, QHS, Karmen Bongo, MD, 20 mg at 12/07/22 2242   sacubitril-valsartan (ENTRESTO) 24-26 mg per tablet, 1 tablet, Oral, BID, Karmen Bongo, MD, 1 tablet at 12/08/22 0902   sodium chloride flush (NS) 0.9 % injection 3 mL, 3 mL, Intravenous, Q12H, Karmen Bongo, MD, 3 mL at 12/07/22 2245   sodium chloride flush (NS) 0.9 %  injection 3 mL, 3 mL, Intravenous, Q12H, Shakiyah Cirilo J, MD, 3 mL at 12/08/22 0903   sodium chloride flush (NS) 0.9 % injection 3 mL, 3 mL, Intravenous, PRN, Jerred Zaremba J, MD   spironolactone (ALDACTONE) tablet 25 mg, 25 mg, Oral, Daily, Akoni Parton J, MD, 25 mg at 12/08/22 0902   traZODone (DESYREL) tablet 25 mg, 25 mg, Oral, QHS PRN, Karmen Bongo, MD   Objective:  Vital Signs in the last 24 hours: Temp:  [97.8 F (36.6 C)-98.8 F (37.1 C)] 98.8 F (37.1 C) (01/31 0417) Pulse Rate:  [54-74] 61 (01/31 0857) Resp:  [9-23] 16 (01/31 0857) BP: (105-163)/(49-72) 113/55 (01/31 0857) SpO2:  [95 %-99 %] 99 % (01/31 0857) Weight:  [105.9 kg] 105.9 kg (01/31 0417)  Intake/Output from previous day: 01/30 0701 - 01/31 0700 In: 240 [P.O.:237; I.V.:3] Out: 1700 [Urine:1700]  Physical Exam Vitals and nursing note reviewed.  Constitutional:      General: He is not in acute distress. Neck:     Vascular: No JVD.  Cardiovascular:     Rate and Rhythm: Normal rate and regular rhythm.     Heart sounds: Normal heart sounds. No murmur heard. Pulmonary:     Effort: Pulmonary effort is normal.     Breath sounds: Examination of the right-lower field reveals rhonchi. Examination of the left-lower field reveals rhonchi. Rhonchi present. No wheezing or rales.  Musculoskeletal:     Right lower leg: Edema (Trace) present.  Left lower leg: Edema (Trace) present.      Imaging/tests reviewed and independently interpreted:  CXR 12/05/2022: Cardiac shadow is stable. Postsurgical changes are again seen. Lungs are well aerated bilaterally. Mild scarring in the left base is seen and stable. No bony abnormality is noted.   IMPRESSION: No active disease.    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  Recommend repeat echocardiogram to evaluate for any presence of MR (tall "v" waves can be seen in diastolic heart failure or mitral regurgitation). After aggressive diuresis, if symptoms remain, could consider repeating RHC to r/o any other forms of PH-especially Grp I. That said, Grp 1 PH/PAH is quite unlikely in a patient with biatrial dilatation with longstanding Afib and CAD. Recommend IV lasix 40 mg bid for next 1-2 days. Hold PO torsemide for now.     Telemetry 12/07/2022: Afib, rate controlled Underlying IVCD  EKG 12/05/2022: Afib 45 bpm IVCD  Echocardiogram 06/23/2022: Normal LV systolic function with visual EF 60-65%. Left ventricle cavity is normal in size. Moderate concentric hypertrophy of the left ventricle. Normal global wall motion. Indeterminate diastolic filling pattern, elevated LAP. Calculated EF 62%. Left atrial cavity is severely dilated at 4.7 cm. Right atrial cavity is severely dilated. Right ventricle cavity is moderately dilated. Normal right ventricular function. Structurally normal tricuspid valve.  Moderate tricuspid regurgitation. Mild pulmonary hypertension. RVSP measures 36 mmHg. IVC is dilated with respiratory variation.  Left Heart Catheterization 07/25/2020:  RCA: Proximal RCA 80% stenosis, large vessel with mild disease in PL and PDA branches.  A secondary PL branch is subtotally occluded. LM: Distal 30-40% stenosis. LAD: LAD diffusely diseased, proximal diffuse 80% followed by tandem 90% stenosis.  Large D1 with moderate diffuse disease and proximal and mid tandem 70 and 80% stenosis.  Has secondary branches and is tortuous. Cx  and RI: Co-dominant Cx with Ostial circumflex 99% involving a moderate sized ramus with ostial 80 to 90% and proximal 90% stenosis. LV: Normal LVEDP.  No pressure gradient across the aortic valve. Patent LIMA and RIMA and RIMA has ostial 20-30% stenosis. Subclavian arteries  widely patent.  Right radial diffusely disease by [place]. 31mL contrast used.    Recommendation: Patient needs evaluation for inpatient CABG in view of NSVT, ventricular standstill.  Radial arterial conduits cannot be utilized as there was significant amount of atherosclerosis evident with ultrasound guidance.  Will discuss with surgery.  We could still consider Maze procedure and left atrial appendage ligation.      Assessment & Recommendations:  72 y/o Caucasian male with hypertension, hyperlipidemia, multivessel CAD s/p CABGX4 (LIMA-distal LAD, RIMA-PDA, seq LRA-OM+ramus) w/ Maze and LAA clipping (2021), persistent Afib, recurrent left pleural effusion requiring VATS for decortication (2022), mild PH, admitted with suspected acute on chronic HFpEF  Acute on chronic HFpEF: Chronic, worsening, NYHA class II/III. EF was 45-50% in 2021, since recovered, 60-65% (06/2022).  [place] 12/07/2022: RA: 7 mmHg RV: 72/2 mmHg PA: 74/9 mmHg, mPAP 30 mmHg PCW: 19 mmHg with tall "v" wave   In spite of under whelming physical exam, filling pressures were elevated. Continue IV lasix 40 mg bid today, diuresing well. Increase spironolactone to 25 mg daily. Given HFpEF and very well controlled heart rate, I will discontinue metoprolol succinate.  Continue Entresto 24-26  mg bid, Farxiga 10 mg daily.  I will repeat limited echocardiogram to look for any MR.  CAD: S/p CABG 2021. No anginal symptoms.   Consider stopping aspirin given ongoing Xarelto use (Will defer to Dr. Terri Skains outpatient). Continue statin  Persistent A-fib: Longstanding, rate controlled. High CHA2DS2VASc score. Continue Xarelto 20 mg daily.   Discussed interpretation of tests and management recommendations with the primary team   Nigel Mormon, MD Pager: [phone] Office: [phone]

## 2022-12-08 NOTE — Progress Notes (Signed)
   12/08/22 1606  Mobility  Activity Ambulated independently in hallway  Level of Assistance Independent  Assistive Device None  Distance Ambulated (ft) 400 ft  Activity Response Tolerated well  Mobility Referral Yes  $Mobility charge 1 Mobility   Mobility Specialist Progress Note  Pre-Mobility: 73 HR During Mobility: 103HR Post-Mobility: 74 HR  Pt was in bed and agreeable. Had no c/o pain. Left EOB w/ all needs met and call bell in reach.   Lucious Groves Mobility Specialist  Please contact via SecureChat or Rehab office at [phone]

## 2022-12-09 DIAGNOSIS — I5032 Chronic diastolic (congestive) heart failure: Secondary | ICD-10-CM | POA: Diagnosis not present

## 2022-12-09 LAB — BASIC METABOLIC PANEL
Anion gap: 9 (ref 5–15)
BUN: 15 mg/dL (ref 8–23)
CO2: 29 mmol/L (ref 22–32)
Calcium: 8.5 mg/dL — ABNORMAL LOW (ref 8.9–10.3)
Chloride: 99 mmol/L (ref 98–111)
Creatinine, Ser: 1.67 mg/dL — ABNORMAL HIGH (ref 0.61–1.24)
GFR, Estimated: 44 mL/min — ABNORMAL LOW (ref >60)
Glucose, Bld: 102 mg/dL — ABNORMAL HIGH (ref 70–99)
Potassium: 3.7 mmol/L (ref 3.5–5.1)
Sodium: 137 mmol/L (ref 135–145)

## 2022-12-09 MED ORDER — SACUBITRIL-VALSARTAN 24-26 MG PO TABS: 1.0000 | ORAL_TABLET | Freq: Two times a day (BID) | ORAL | Status: DC

## 2022-12-09 NOTE — Progress Notes (Addendum)
Subjective:  S/p RHC 12/07/2022, details below Breathing much improved with diuresis Net negative 5.4 L.  Lost about 16 pounds since admission. Creatinine increased 1.67 today. Blood pressure 90/50 mmHg this morning without symptoms.     Current Facility-Administered Medications:    0.9 %  sodium chloride infusion, 250 mL, Intravenous, PRN, Jasime Westergren J, MD   acetaminophen (TYLENOL) tablet 650 mg, 650 mg, Oral, Q6H PRN **OR** acetaminophen (TYLENOL) suppository 650 mg, 650 mg, Rectal, Q6H PRN, Karmen Bongo, MD   acetaminophen (TYLENOL) tablet 650 mg, 650 mg, Oral, Q4H PRN, Krisanne Lich J, MD   aspirin EC tablet 81 mg, 81 mg, Oral, QHS, Karmen Bongo, MD, 81 mg at 12/08/22 2149   bisacodyl (DULCOLAX) EC tablet 5 mg, 5 mg, Oral, Daily PRN, Karmen Bongo, MD   dapagliflozin propanediol (FARXIGA) tablet 10 mg, 10 mg, Oral, Daily, Tolia, Sunit, DO, 10 mg at 12/08/22 0902   docusate sodium (COLACE) capsule 100 mg, 100 mg, Oral, BID, Karmen Bongo, MD   ondansetron [facility]) tablet 4 mg, 4 mg, Oral, Q6H PRN **OR** ondansetron (ZOFRAN) injection 4 mg, 4 mg, Intravenous, Q6H PRN, Karmen Bongo, MD   ondansetron [facility]) injection 4 mg, 4 mg, Intravenous, Q6H PRN, Elton Catalano J, MD   oxyCODONE (Oxy IR/ROXICODONE) immediate release tablet 5 mg, 5 mg, Oral, Q4H PRN, Karmen Bongo, MD   polyethylene glycol (MIRALAX / GLYCOLAX) packet 17 g, 17 g, Oral, Daily PRN, Karmen Bongo, MD   rivaroxaban Alveda Reasons) tablet 20 mg, 20 mg, Oral, Q supper, Karmen Bongo, MD, 20 mg at 12/08/22 1658   rosuvastatin (CRESTOR) tablet 20 mg, 20 mg, Oral, QHS, Karmen Bongo, MD, 20 mg at 12/08/22 2149   [START ON 12/10/2022] sacubitril-valsartan (ENTRESTO) 24-26 mg per tablet, 1 tablet, Oral, BID, Domenic Polite, MD   sodium chloride flush (NS) 0.9 % injection 3 mL, 3 mL, Intravenous, Q12H, Karmen Bongo, MD, 3 mL at 12/07/22 2245   sodium chloride flush (NS) 0.9 % injection 3 mL, 3 mL,  Intravenous, Q12H, Damiana Berrian J, MD, 3 mL at 12/08/22 2151   sodium chloride flush (NS) 0.9 % injection 3 mL, 3 mL, Intravenous, PRN, Sinthia Karabin J, MD   spironolactone (ALDACTONE) tablet 25 mg, 25 mg, Oral, Daily, Loria Lacina J, MD, 25 mg at 12/08/22 0902   traZODone (DESYREL) tablet 25 mg, 25 mg, Oral, QHS PRN, Karmen Bongo, MD   Objective:  Vital Signs in the last 24 hours: Temp:  [97.7 F (36.5 C)-98 F (36.7 C)] 98 F (36.7 C) (02/01 0425) Pulse Rate:  [61-69] 64 (02/01 0820) Resp:  [16-20] 16 (02/01 0820) BP: (99-124)/(50-57) 99/50 (02/01 0820) SpO2:  [97 %-99 %] 99 % (02/01 0820) Weight:  [103.9 kg] 103.9 kg (02/01 0425)  Intake/Output from previous day: 01/31 0701 - 02/01 0700 In: 243 [P.O.:240; I.V.:3] Out: 2700 [Urine:2700]  Physical Exam Vitals and nursing note reviewed.  Constitutional:      General: He is not in acute distress. Neck:     Vascular: No JVD.  Cardiovascular:     Rate and Rhythm: Normal rate and regular rhythm.     Heart sounds: Normal heart sounds. No murmur heard. Pulmonary:     Effort: Pulmonary effort is normal.     Breath sounds: Examination of the right-lower field reveals rhonchi. Examination of the left-lower field reveals rhonchi. Rhonchi present. No wheezing or rales.  Musculoskeletal:     Right lower leg: Edema (Trace) present.     Left lower leg: Edema (Trace) present.  Imaging/tests reviewed and independently interpreted:  CXR 12/05/2022: Cardiac shadow is stable. Postsurgical changes are again seen. Lungs are well aerated bilaterally. Mild scarring in the left base is seen and stable. No bony abnormality is noted.   IMPRESSION: No active disease.    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  Recommend repeat echocardiogram to evaluate for any presence of MR (tall "v" waves can be seen in diastolic heart failure or mitral regurgitation). After aggressive diuresis, if symptoms remain, could consider repeating RHC to r/o any other forms of PH-especially Grp I. That said, Grp 1 PH/PAH is quite unlikely in a patient with biatrial dilatation with longstanding Afib and CAD. Recommend IV lasix 40 mg bid for next 1-2 days. Hold PO torsemide for now.     Telemetry 12/07/2022: Afib, rate controlled Underlying IVCD  EKG 12/05/2022: Afib 45 bpm IVCD  Echocardiogram 06/23/2022: Normal LV systolic function with visual EF 60-65%. Left ventricle cavity is normal in size. Moderate concentric hypertrophy of the left ventricle. Normal global wall motion. Indeterminate diastolic filling pattern, elevated LAP. Calculated EF 62%. Left atrial cavity is severely dilated at 4.7 cm. Right atrial cavity is severely dilated. Right ventricle cavity is moderately dilated. Normal right ventricular function. Structurally normal tricuspid valve.  Moderate tricuspid regurgitation. Mild pulmonary hypertension. RVSP measures 36 mmHg. IVC is dilated with respiratory variation.  Left Heart Catheterization 07/25/2020:  RCA: Proximal RCA 80% stenosis, large vessel with mild disease in PL and PDA branches.  A secondary PL branch is subtotally occluded. LM: Distal 30-40% stenosis. LAD: LAD diffusely diseased, proximal diffuse 80% followed by tandem 90% stenosis.  Large D1 with moderate diffuse disease and proximal and mid tandem 70 and 80% stenosis.  Has secondary branches and is tortuous. Cx  and RI: Co-dominant Cx with Ostial circumflex 99% involving a moderate sized ramus with ostial 80 to 90% and proximal 90% stenosis. LV: Normal LVEDP.  No pressure gradient across the aortic valve. Patent LIMA and RIMA and RIMA has ostial 20-30% stenosis. Subclavian arteries widely patent.  Right  radial diffusely disease by [place]. 58mL contrast used.    Recommendation: Patient needs evaluation for inpatient CABG in view of NSVT, ventricular standstill.  Radial arterial conduits cannot be utilized as there was significant amount of atherosclerosis evident with ultrasound guidance.  Will discuss with surgery.  We could still consider Maze procedure and left atrial appendage ligation.      Assessment & Recommendations:  73 y/o Caucasian male with hypertension, hyperlipidemia, multivessel CAD s/p CABGX4 (LIMA-distal LAD, RIMA-PDA, seq LRA-OM+ramus) w/ Maze and LAA clipping (2021), persistent Afib, recurrent left pleural effusion requiring VATS for decortication (2022), mild PH, admitted with suspected acute on chronic HFpEF  Acute on chronic HFpEF: Chronic, worsening, NYHA class II/III. EF was 45-50% in 2021, since recovered, 60-65% (06/2022).  [place] 12/07/2022: RA: 7 mmHg RV: 72/2 mmHg PA: 74/9 mmHg, mPAP 30 mmHg PCW: 19 mmHg with tall "v" wave  No significant MR on echocardiogram 12/07/2022. Aggressively diuresed over the last 3 days, now net -5.4 L. Significant improvement in breathing with diuresis.  Patient was able to walk the hallway yesterday without any significant shortness of breath. Hypotension and AKI this morning with creatinine 1.67. Discontinue IV Lasix. Hold Entresto, spironolactone, Farxiga today. Encourage  oral hydration. Stopped low-dose metoprolol this admission due to heart rate around 50 to 60 bpm, and primarily HFpEF, not HFrEF at this time.  Reasonable to monitor the patient overnight.  I am optimistic that his creatinine will improve with improvement in blood pressure and holding nephrotoxic agents today.  He may be able to resume 1 or more of Entresto, spironolactone, Farxiga tomorrow, with potential discharge tomorrow 12/10/2022.  CAD: S/p CABG 2021. No anginal symptoms.   Will stop aspirin given ongoing Xarelto use  Continue statin  Persistent  A-fib: Longstanding, rate controlled. High CHA2DS2VASc score. Continue Xarelto 20 mg daily.   Discussed interpretation of tests and management recommendations with the primary team   Nigel Mormon, MD Pager: [phone] Office: [phone]

## 2022-12-09 NOTE — Plan of Care (Signed)
  Problem: Education: Goal: Knowledge of General Education information will improve Description: Including pain rating scale, medication(s)/side effects and non-pharmacologic comfort measures Outcome: Progressing   Problem: Health Behavior/Discharge Planning: Goal: Ability to manage health-related needs will improve Outcome: Progressing   Problem: Clinical Measurements: Goal: Diagnostic test results will improve Outcome: Progressing   

## 2022-12-09 NOTE — Progress Notes (Signed)
PROGRESS NOTE    [name] Rice  [id number] DOB: 1951/05/28 DOA: 12/05/2022 PCP: Pcp, No  71/M w/ chronic.atrial fibrillation, diastolic CHF, coronary artery disease sp CABG, pleural effusion sp VATS, hypertension and dyslipidemia who presented with dyspnea. Reported dyspnea on exertion and orthopnea for about 2 weeks, he was evaluated by his primary care 7 days ago and he was advised to got to the ED.  cr 1,30, BNP 1,483, CXR w/Chest radiograph with mild cardiomegaly, bilateral hilar vascular congestion and mild cephalization of the vasculature.  -Admitted, started on diuretics Cardiology following, RHC RA: 7 mmHg RV: 72/2 mmHg PA: 74/9 mmHg, mPAP 30 mmHg PCW: 19 mmHg,    Subjective: Feels much better overall, breathing close to baseline   Assessment and Plan:  Acute on chronic diastolic CHF [id number] echo with mild reduction in LV systolic function 45 to [id number].  -repeat ECHo w/ EF 60%, mildly reduced RV -diuresing on IV lasix, 5.5 L negative, weight down 14lbs, -Blood pressures dropped to 90s, creatinine up to 1.6 today, hold further Lasix, Entresto, Farxiga and Aldactone -RHC w.RA: 7 mmHg RV: 72/2 mmHg PA: 74/9 mmHg, mPAP 30 mmHg PCW: 19 mmHg,  -If blood pressure and kidney function is stable will resume [place] and Aldactone tomorrow  Atrial fibrillation, chronic ([place]) -Per cards notes, has permanent A-fib Continue ,metoprolol and xarelto  Pure hypercholesterolemia Continue rosuvastatin.   CAD, CABG -troponin elevation due to heart failure exacerbation.  No ACS  Essential hypertension Continue metoprolol: See discussion above  AKI on CKD (chronic kidney disease) stage 2, GFR 60-89 ml/min Hypokalemia -See discussion above   DVT prophylaxis: Xarelto Code Status: Full Code Family Communication: None present Disposition Plan: Home tomorrow if stable  Consultants:    Procedures:   Antimicrobials:    Objective: Vitals:   12/08/22 1636 12/08/22 1951 12/09/22 0425  12/09/22 0820  BP: (!) 113/52 (!) 113/54 (!) 124/57 (!) 99/50  Pulse: 62 69 61 64  Resp: 20 20 20 16   Temp: 97.7 F (36.5 C) 97.7 F (36.5 C) 98 F (36.7 C)   TempSrc: Oral Oral Oral   SpO2: 98% 97% 98% 99%  Weight:   103.9 kg   Height:        Intake/Output Summary (Last 24 hours) at 12/09/2022 1128 Last data filed at 12/09/2022 0818 Gross per 24 hour  Intake 363 ml  Output 2500 ml  Net -2137 ml   Filed Weights   12/07/22 0413 12/08/22 0417 12/09/22 0425  Weight: 109 kg 105.9 kg 103.9 kg    Examination:  Gen: AAOx3, no distress HEENT: No JVD CVS: S1-S2, regular rhythm Lungs: Few basilar rhonchi otherwise clear Abdomen: Soft, nontender, bowel sounds present Extremities: Trace edema Skin: no new rashes on exposed skin    Data Reviewed:   CBC: Recent Labs  Lab 12/05/22 1811 12/07/22 0135 12/07/22 1622 12/08/22 0126  WBC 6.0 5.2  --  5.2  NEUTROABS 4.0  --   --   --   HGB 9.4* 8.6* 10.9*  10.9* 9.5*  HCT 33.5* 32.1* 32.0*  32.0* 32.9*  MCV 74.9* 76.4*  --  75.5*  PLT 254 247  --  [id number]   Basic Metabolic Panel: Recent Labs  Lab 12/05/22 1811 12/07/22 0135 12/07/22 1622 12/08/22 0126 12/09/22 0037  NA 135 137 142  142 137 137  K 3.5 3.1* 3.3*  3.3* 4.1 3.7  CL 104 103  --  103 99  CO2 23 23  --  23 29  GLUCOSE 98  99  --  99 102*  BUN 17 13  --  13 15  CREATININE 1.30* 1.21  --  1.22 1.67*  CALCIUM 8.1* 8.3*  --  8.3* 8.5*  MG 2.3  --   --  2.2  --    GFR: Estimated Creatinine Clearance: 52.8 mL/min (A) (by C-G formula based on SCr of 1.67 mg/dL (H)). Liver Function Tests: Recent Labs  Lab 12/05/22 1811  AST 18  ALT 7  ALKPHOS 113  BILITOT 2.6*  PROT 7.1  ALBUMIN 3.6   No results for input(s): "LIPASE", "AMYLASE" in the last 168 hours. No results for input(s): "AMMONIA" in the last 168 hours. Coagulation Profile: No results for input(s): "INR", "PROTIME" in the last 168 hours. Cardiac Enzymes: No results for input(s): "CKTOTAL", "CKMB",  "CKMBINDEX", "TROPONINI" in the last 168 hours. BNP (last 3 results) Recent Labs    06/23/22 1148 07/09/22 1049 09/02/22 0958  PROBNP 7,015* 4,649* 3,271*   HbA1C: No results for input(s): "HGBA1C" in the last 72 hours. CBG: No results for input(s): "GLUCAP" in the last 168 hours. Lipid Profile: Recent Labs    12/07/22 0135  CHOL 82  HDL 32*  LDLCALC 39  TRIG 56  CHOLHDL 2.6  LDLDIRECT 38   Thyroid Function Tests: No results for input(s): "TSH", "T4TOTAL", "FREET4", "T3FREE", "THYROIDAB" in the last 72 hours. Anemia Panel: No results for input(s): "VITAMINB12", "FOLATE", "FERRITIN", "TIBC", "IRON", "RETICCTPCT" in the last 72 hours. Urine analysis:    Component Value Date/Time   COLORURINE YELLOW 09/23/2020 2135   APPEARANCEUR HAZY (A) 09/23/2020 2135   LABSPEC 1.041 (H) 09/23/2020 2135   PHURINE 5.0 09/23/2020 2135   GLUCOSEU NEGATIVE 09/23/2020 2135   HGBUR LARGE (A) 09/23/2020 2135   BILIRUBINUR NEGATIVE 09/23/2020 2135   KETONESUR 5 (A) 09/23/2020 2135   PROTEINUR 30 (A) 09/23/2020 2135   NITRITE NEGATIVE 09/23/2020 2135   LEUKOCYTESUR TRACE (A) 09/23/2020 2135   Sepsis Labs: @LABRCNTIP (procalcitonin:4,lacticidven:4)  ) Recent Results (from the past 240 hour(s))  Resp panel by RT-PCR (RSV, Flu A&B, Covid) Nasopharyngeal Swab     Status: None   Collection Time: 12/05/22  5:11 PM   Specimen: Nasopharyngeal Swab; Nasal Swab  Result Value Ref Range Status   SARS Coronavirus 2 by RT PCR NEGATIVE NEGATIVE Final    Comment: (NOTE) SARS-CoV-2 target nucleic acids are NOT DETECTED.  The SARS-CoV-2 RNA is generally detectable in upper respiratory specimens during the acute phase of infection. The lowest concentration of SARS-CoV-2 viral copies this assay can detect is 138 copies/mL. A negative result does not preclude SARS-Cov-2 infection and should not be used as the sole basis for treatment or other patient management decisions. A negative result may occur  with  improper specimen collection/handling, submission of specimen other than nasopharyngeal swab, presence of viral mutation(s) within the areas targeted by this assay, and inadequate number of viral copies(<138 copies/mL). A negative result must be combined with clinical observations, patient history, and epidemiological information. The expected result is Negative.  Fact Sheet for Patients:  EntrepreneurPulse.com.au  Fact Sheet for Healthcare Providers:  IncredibleEmployment.be  This test is no t yet approved or cleared by the [place] FDA and  has been authorized for detection and/or diagnosis of SARS-CoV-2 by FDA under an Emergency Use Authorization (EUA). This EUA will remain  in effect (meaning this test can be used) for the duration of the COVID-19 declaration under Section 564(b)(1) of the Act, 21 U.S.C.section 360bbb-3(b)(1), unless the authorization is terminated  or revoked sooner.       Influenza A by PCR NEGATIVE NEGATIVE Final   Influenza B by PCR NEGATIVE NEGATIVE Final    Comment: (NOTE) The Xpert Xpress SARS-CoV-2/FLU/RSV plus assay is intended as an aid in the diagnosis of influenza from Nasopharyngeal swab specimens and should not be used as a sole basis for treatment. Nasal washings and aspirates are unacceptable for Xpert Xpress SARS-CoV-2/FLU/RSV testing.  Fact Sheet for Patients: BloggerCourse.com  Fact Sheet for Healthcare Providers: SeriousBroker.it  This test is not yet approved or cleared by the [place] FDA and has been authorized for detection and/or diagnosis of SARS-CoV-2 by FDA under an Emergency Use Authorization (EUA). This EUA will remain in effect (meaning this test can be used) for the duration of the COVID-19 declaration under Section 564(b)(1) of the Act, 21 U.S.C. section 360bbb-3(b)(1), unless the authorization is terminated  or revoked.     Resp Syncytial Virus by PCR NEGATIVE NEGATIVE Final    Comment: (NOTE) Fact Sheet for Patients: BloggerCourse.com  Fact Sheet for Healthcare Providers: SeriousBroker.it  This test is not yet approved or cleared by the [place] FDA and has been authorized for detection and/or diagnosis of SARS-CoV-2 by FDA under an Emergency Use Authorization (EUA). This EUA will remain in effect (meaning this test can be used) for the duration of the COVID-19 declaration under Section 564(b)(1) of the Act, 21 U.S.C. section 360bbb-3(b)(1), unless the authorization is terminated or revoked.  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 Recommend repeat echocardiogram to evaluate for any presence of MR (tall "v" waves can be seen in diastolic heart failure or mitral regurgitation). After aggressive diuresis, if symptoms remain, could consider repeating RHC to r/o any other forms of PH-especially Grp I. That said, Grp 1 PH/PAH is quite unlikely in a patient with biatrial dilatation with longstanding Afib and CAD. Recommend IV lasix 40 mg bid for next 1-2 days. Hold PO torsemide for now. [name], MD Pager: [phone] Office: [phone]  ECHOCARDIOGRAM LIMITED  Result Date: 12/07/2022    ECHOCARDIOGRAM LIMITED REPORT   Patient Name:   [name] Rice Date of Exam: 12/07/2022 Medical Rec #:  [id number]      Height:       73.0 in Accession #:    [id number]     Weight:       240.3 lb Date of Birth:  08/02/1951      BSA:          2.326 m Patient Age:    71 years       BP:           120/60 mmHg  Patient Gender: M              HR:           65 bpm. Exam Location:  Inpatient Procedure: Limited Echo and Limited Color Doppler Indications:    Dyspnea R06.00  History:        Patient has prior history of Echocardiogram examinations, most                 recent 09/27/2020. CHF, CAD, Prior CABG, Arrythmias:Atrial  Fibrillation, Bradycardia and LBBB, Signs/Symptoms:Dyspnea; Risk                 Factors:Hypertension. CKD, stage III.  Sonographer:    Ronny Flurry Referring Phys: [id number] SUNIT TOLIA IMPRESSIONS  1. Left ventricular ejection fraction, by estimation, is 60 to 65%. The left ventricle has normal function. The left ventricle has no regional wall motion abnormalities. Left ventricular diastolic function could not be evaluated.  2. Right ventricular systolic function is mildly reduced. The right ventricular size is normal. There is mildly elevated pulmonary artery systolic pressure. The estimated right ventricular systolic pressure is 73.2 mmHg.  3. The mitral valve is degenerative. Trivial mitral valve regurgitation. No evidence of mitral stenosis. Moderate mitral annular calcification.  4. The aortic valve is calcified. Aortic valve regurgitation is not visualized. Aortic valve sclerosis is present, with no evidence of aortic valve stenosis.  5. The inferior vena cava is normal in size with greater than 50% respiratory variability, suggesting right atrial pressure of 3 mmHg. Comparison(s): No significant change from prior study. FINDINGS  Left Ventricle: Left ventricular ejection fraction, by estimation, is 60 to 65%. The left ventricle has normal function. The left ventricle has no regional wall motion abnormalities. The left ventricular internal cavity size was normal in size. There is  no left ventricular hypertrophy. Abnormal (paradoxical) septal motion consistent with post-operative status. Left ventricular diastolic function could not be evaluated. Right Ventricle: The right  ventricular size is normal. No increase in right ventricular wall thickness. Right ventricular systolic function is mildly reduced. There is mildly elevated pulmonary artery systolic pressure. The tricuspid regurgitant velocity  is 2.92 m/s, and with an assumed right atrial pressure of 3 mmHg, the estimated right ventricular systolic pressure is 20.2 mmHg. Pericardium: There is no evidence of pericardial effusion. Mitral Valve: The mitral valve is degenerative in appearance. Moderate mitral annular calcification. Trivial mitral valve regurgitation. No evidence of mitral valve stenosis. Tricuspid Valve: The tricuspid valve is grossly normal. Tricuspid valve regurgitation is trivial. No evidence of tricuspid stenosis. Aortic Valve: The aortic valve is calcified. Aortic valve regurgitation is not visualized. Aortic valve sclerosis is present, with no evidence of aortic valve stenosis. Aorta: The aortic root and ascending aorta are structurally normal, with no evidence of dilitation. Venous: The inferior vena cava is normal in size with greater than 50% respiratory variability, suggesting right atrial pressure of 3 mmHg. LEFT VENTRICLE PLAX 2D LVIDd:         6.40 cm LVIDs:         4.70 cm LV PW:         0.90 cm LV IVS:        1.10 cm LVOT diam:     2.10 cm LVOT Area:     3.46 cm  IVC IVC diam: 1.60 cm LEFT ATRIUM         Index LA diam:    5.50 cm 2.36 cm/m   AORTA Ao Root diam: 3.50 cm Ao Asc diam:  3.40 cm TRICUSPID VALVE TR Peak grad:   34.1 mmHg TR Vmax:        292.00 cm/s  SHUNTS Systemic Diam: 2.10 cm Eleonore Chiquito MD Electronically signed by Eleonore Chiquito MD Signature Date/Time: 12/07/2022/2:17:17 PM    Final      Scheduled Meds:  docusate sodium  100 mg Oral BID   rivaroxaban  20 mg Oral Q supper   rosuvastatin  20 mg Oral QHS   sodium chloride flush  3 mL  Intravenous Q12H   sodium chloride flush  3 mL Intravenous Q12H   Continuous Infusions:  sodium chloride       LOS: 3 days    Time spent:  37min    Domenic Polite, MD [facility]   12/09/2022, 11:28 AM

## 2022-12-09 NOTE — Care Management Important Message (Signed)
Important Message  Patient Details  Name: [name] Rice MRN: [id number] Date of Birth: 1951/05/03   Medicare Important Message Given:  Yes     [name] 12/09/2022, 4:08 PM

## 2022-12-10 ENCOUNTER — Other Ambulatory Visit (HOSPITAL_COMMUNITY): Payer: Self-pay

## 2022-12-10 LAB — BASIC METABOLIC PANEL
Anion gap: 7 (ref 5–15)
BUN: 21 mg/dL (ref 8–23)
CO2: 29 mmol/L (ref 22–32)
Calcium: 8.5 mg/dL — ABNORMAL LOW (ref 8.9–10.3)
Chloride: 99 mmol/L (ref 98–111)
Creatinine, Ser: 1.45 mg/dL — ABNORMAL HIGH (ref 0.61–1.24)
GFR, Estimated: 52 mL/min — ABNORMAL LOW (ref >60)
Glucose, Bld: 98 mg/dL (ref 70–99)
Potassium: 3.6 mmol/L (ref 3.5–5.1)
Sodium: 135 mmol/L (ref 135–145)

## 2022-12-10 MED ORDER — SPIRONOLACTONE 25 MG PO TABS
25.0000 mg | ORAL_TABLET | Freq: Every day | ORAL | 0 refills | Status: DC
  Filled 2022-12-10: qty 30, 30d supply, fill #0

## 2022-12-10 MED ORDER — SACUBITRIL-VALSARTAN 24-26 MG PO TABS: 1.0000 | ORAL_TABLET | Freq: Two times a day (BID) | ORAL | 0 refills | Status: DC

## 2022-12-10 MED ORDER — SPIRONOLACTONE 25 MG PO TABS
12.5000 mg | ORAL_TABLET | Freq: Every day | ORAL | 2 refills | Status: DC
  Filled 2022-12-10: qty 30, 60d supply, fill #0

## 2022-12-10 MED ORDER — TORSEMIDE 20 MG PO TABS
10.0000 mg | ORAL_TABLET | ORAL | 0 refills | Status: DC | PRN
  Filled 2022-12-10: qty 30, 30d supply, fill #0

## 2022-12-10 NOTE — Progress Notes (Signed)
Subjective:  S/p RHC 12/07/2022, details below Breathing much improved with diuresis BP and Cr improved today.     Current Facility-Administered Medications:    0.9 %  sodium chloride infusion, 250 mL, Intravenous, PRN, Daveon Arpino J, MD   acetaminophen (TYLENOL) tablet 650 mg, 650 mg, Oral, Q6H PRN **OR** acetaminophen (TYLENOL) suppository 650 mg, 650 mg, Rectal, Q6H PRN, Karmen Bongo, MD   acetaminophen (TYLENOL) tablet 650 mg, 650 mg, Oral, Q4H PRN, Dasiah Hooley J, MD   bisacodyl (DULCOLAX) EC tablet 5 mg, 5 mg, Oral, Daily PRN, Karmen Bongo, MD   docusate sodium (COLACE) capsule 100 mg, 100 mg, Oral, BID, Karmen Bongo, MD   ondansetron [facility]) tablet 4 mg, 4 mg, Oral, Q6H PRN **OR** ondansetron (ZOFRAN) injection 4 mg, 4 mg, Intravenous, Q6H PRN, Karmen Bongo, MD   ondansetron (ZOFRAN) injection 4 mg, 4 mg, Intravenous, Q6H PRN, Natalie Mceuen J, MD   oxyCODONE (Oxy IR/ROXICODONE) immediate release tablet 5 mg, 5 mg, Oral, Q4H PRN, Karmen Bongo, MD   polyethylene glycol (MIRALAX / GLYCOLAX) packet 17 g, 17 g, Oral, Daily PRN, Karmen Bongo, MD   rivaroxaban [facility]) tablet 20 mg, 20 mg, Oral, Q supper, Karmen Bongo, MD, 20 mg at 12/09/22 1659   rosuvastatin (CRESTOR) tablet 20 mg, 20 mg, Oral, QHS, Karmen Bongo, MD, 20 mg at 12/09/22 2145   sodium chloride flush (NS) 0.9 % injection 3 mL, 3 mL, Intravenous, Q12H, Karmen Bongo, MD, 3 mL at 12/09/22 2200   sodium chloride flush (NS) 0.9 % injection 3 mL, 3 mL, Intravenous, Q12H, [place]on Merry J, MD, 3 mL at 12/09/22 2147   sodium chloride flush (NS) 0.9 % injection 3 mL, 3 mL, Intravenous, PRN, Kedron Uno J, MD   traZODone (DESYREL) tablet 25 mg, 25 mg, Oral, QHS PRN, Karmen Bongo, MD   Objective:  Vital Signs in the last 24 hours: Temp:  [97.6 F (36.4 C)-98 F (36.7 C)] 98 F (36.7 C) (02/02 0432) Pulse Rate:  [53-76] 76 (02/02 0432) Resp:  [16-18] 16 (02/02 0844) BP:  (108-129)/(49-64) 108/49 (02/02 0844) SpO2:  [94 %-100 %] 100 % (02/02 0844) Weight:  [104.6 kg] 104.6 kg (02/02 0437)  Intake/Output from previous day: 02/01 0701 - 02/02 0700 In: 220 [P.O.:220] Out: 775 [Urine:775]  Physical Exam Vitals and nursing note reviewed.  Constitutional:      General: He is not in acute distress. Neck:     Vascular: No JVD.  Cardiovascular:     Rate and Rhythm: Normal rate and regular rhythm.     Heart sounds: Normal heart sounds. No murmur heard. Pulmonary:     Effort: Pulmonary effort is normal.     Breath sounds: Examination of the right-lower field reveals rhonchi. Examination of the left-lower field reveals rhonchi. Rhonchi present. No wheezing or rales.  Musculoskeletal:     Right lower leg: Edema (Trace) present.     Left lower leg: Edema (Trace) present.      Imaging/tests reviewed and independently interpreted:  CXR 12/05/2022: Cardiac shadow is stable. Postsurgical changes are again seen. Lungs are well aerated bilaterally. Mild scarring in the left base is seen and stable. No bony abnormality is noted.   IMPRESSION: No active disease.    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  Recommend repeat echocardiogram to evaluate for any presence of MR (tall "v" waves can be seen in diastolic heart failure or mitral regurgitation). After aggressive diuresis, if symptoms remain, could consider repeating RHC to r/o any other forms of PH-especially Grp I. That said, Grp 1 PH/PAH is quite unlikely in a patient with biatrial dilatation with longstanding Afib and CAD. Recommend IV lasix 40 mg bid for next 1-2 days. Hold PO torsemide for now.     Telemetry 12/07/2022: Afib, rate controlled Underlying IVCD  EKG  12/05/2022: Afib 45 bpm IVCD  Echocardiogram 06/23/2022: Normal LV systolic function with visual EF 60-65%. Left ventricle cavity is normal in size. Moderate concentric hypertrophy of the left ventricle. Normal global wall motion. Indeterminate diastolic filling pattern, elevated LAP. Calculated EF 62%. Left atrial cavity is severely dilated at 4.7 cm. Right atrial cavity is severely dilated. Right ventricle cavity is moderately dilated. Normal right ventricular function. Structurally normal tricuspid valve.  Moderate tricuspid regurgitation. Mild pulmonary hypertension. RVSP measures 36 mmHg. IVC is dilated with respiratory variation.  Left Heart Catheterization 07/25/2020:  RCA: Proximal RCA 80% stenosis, large vessel with mild disease in PL and PDA branches.  A secondary PL branch is subtotally occluded. LM: Distal 30-40% stenosis. LAD: LAD diffusely diseased, proximal diffuse 80% followed by tandem 90% stenosis.  Large D1 with moderate diffuse disease and proximal and mid tandem 70 and 80% stenosis.  Has secondary branches and is tortuous. Cx  and RI: Co-dominant Cx with Ostial circumflex 99% involving a moderate sized ramus with ostial 80 to 90% and proximal 90% stenosis. LV: Normal LVEDP.  No pressure gradient across the aortic valve. Patent LIMA and RIMA and RIMA has ostial 20-30% stenosis. Subclavian arteries widely patent.  Right radial diffusely disease by [place]. 50mL contrast used.    Recommendation: Patient needs evaluation for inpatient CABG in view of NSVT, ventricular standstill.  Radial arterial conduits cannot be utilized as there was significant amount of atherosclerosis evident with ultrasound guidance.  Will discuss with surgery.  We could still consider Maze procedure and left atrial appendage ligation.      Assessment & Recommendations:  72 y/o Caucasian male with hypertension, hyperlipidemia, multivessel CAD s/p CABGX4 (LIMA-distal LAD, RIMA-PDA, seq LRA-OM+ramus)  w/ Maze and LAA clipping (2021), persistent Afib, recurrent left pleural effusion requiring VATS for decortication (2022), mild PH, admitted with suspected acute on chronic HFpEF  Acute on chronic HFpEF: Chronic, worsening, NYHA class II/III. EF was 45-50% in 2021, since recovered, 60-65% (06/2022).  [place] 12/07/2022: RA: 7 mmHg RV: 72/2 mmHg PA: 74/9 mmHg, mPAP 30 mmHg PCW: 19 mmHg with tall "v" wave  No significant MR on echocardiogram 12/07/2022. Aggressively diuresed over the last 3-4 days, now net -6.1 L. Significant improvement in breathing with diuresis.  Patient was able to walk the hallway yesterday without any significant shortness of breath. BP and Cr improved today. Resume spironolactone 12.5 mg daily and Farxiga 10 mg daily on discharge. Resume Entresto 24-26 mg bid on 12/11/2022. Continue to hold additional diuretics until outpatient f/u w/Dr. Terri Skains. Stopped low-dose metoprolol this admission due to heart rate around 50 to 60 bpm, and primarily HFpEF, not HFrEF at this time.  CAD: S/p CABG 2021. No anginal symptoms.   Stopped aspirin given ongoing Xarelto use  Continue statin  Persistent A-fib: Longstanding, rate controlled. High CHA2DS2VASc score. Continue Xarelto 20 mg daily.  Discharge home today F/u 12/17/2022 w/Dr. Terri Skains at 2:45 PM Check daily weights with goal of dry weight <  230 lbs  Discussed interpretation of tests and management recommendations with the primary team   Nigel Mormon, MD Pager: [phone] Office: [phone]

## 2022-12-10 NOTE — Progress Notes (Signed)
[name] Rice to be D/C'd home per MD order. Discussed with the patient and all questions fully answered. TOC meds delivered to patient. Skin clean, dry and intact without evidence of skin break down, no evidence of skin tears noted.  IV catheter discontinued intact. Site without signs and symptoms of complications. Dressing and pressure applied.  An After Visit Summary was printed and given to the patient.  Patient escorted via [place], and D/C home via private auto.  [name]  12/10/2022 10:43 AM

## 2022-12-10 NOTE — Discharge Summary (Signed)
Physician Discharge Summary  [name] Rice GQQ:[id number] DOB: 10/16/1951 DOA: 12/05/2022  PCP: Pcp, No  Admit date: 12/05/2022 Discharge date: 12/10/2022  Time spent: 45 minutes  Recommendations for Outpatient Follow-up:  Cardiology Dr. Terri Rice in 1 week, please check BMP at follow-up  Discharge Diagnoses:  Principal Problem:   Acute on chronic heart failure with preserved ejection fraction (HFpEF) (HCC) Active Problems:   Atrial fibrillation, chronic ([place])   Pure hypercholesterolemia   CAD, multiple vessel   Essential hypertension   CKD (chronic kidney disease) stage 2, GFR 60-89 ml/min   Discharge Condition: stable  Diet recommendation: low sodium  Filed Weights   12/08/22 0417 12/09/22 0425 12/10/22 0437  Weight: 105.9 kg 103.9 kg 104.6 kg    History of present illness:  71/M w/ chronic.atrial fibrillation, diastolic CHF, coronary artery disease sp CABG, pleural effusion sp VATS, hypertension and dyslipidemia who presented with dyspnea. Reported dyspnea on exertion and orthopnea for about 2 weeks, he was evaluated by his primary care 7 days ago and he was advised to got to the ED.  cr 1,30, BNP 1,483, CXR w/Chest radiograph with mild cardiomegaly, bilateral hilar vascular congestion and mild cephalization of the vasculature.  -Admitted, started on diuretics  Hospital Course:  Acute on chronic diastolic CHF 6712 echo with mild reduction in LV systolic function 45 to 45%.  -repeat ECHo w/ EF 60%, mildly reduced RV -RHC w.RA: 7 mmHg RV: 72/2 mmHg PA: 74/9 mmHg, mPAP 30 mmHg PCW: 19 mmHg,  -diuresing on IV lasix, 7 L negative, weight down 15lbs, -Blood pressures dropped to 90s yesterday and creatinine bumped to 1.6, held further diuretics and Entresto -Creatinine improved, down to 1.4 this morning, discussed with cards will resume Aldactone 12.5 Mg daily and Farxiga at discharge, resume Entresto tomorrow on 2/3 -Advised to hold diuretics until follow-up unless he notices 3 pound  weight gain in 1 day or 5 pounds in a week, follow-up with Dr. Terri Rice next week   Atrial fibrillation, chronic ([place]) -Per cards notes, has permanent A-fib Continue ,metoprolol and xarelto   Pure hypercholesterolemia Continue rosuvastatin.    CAD, CABG -troponin elevation due to heart failure exacerbation.  No ACS   Essential hypertension Continue metoprolol: See discussion above   AKI on CKD (chronic kidney disease) stage 2, GFR 60-89 ml/min Hypokalemia -See discussion above    Consultations: Cardiology Dr. Rick Rice  Discharge Exam: Vitals:   12/10/22 0432 12/10/22 0844  BP: 117/60 (!) 108/49  Pulse: 76   Resp: 18 16  Temp: 98 F (36.7 C)   SpO2: 94% 100%   Gen: Awake, Alert, Oriented X 3,  HEENT: no JVD Lungs: Good air movement bilaterally, CTAB CVS: S1S2/RRR Abd: soft, Non tender, non distended, BS present Extremities: No edema Skin: no new rashes on exposed skin   Discharge Instructions   Discharge Instructions     Diet - low sodium heart healthy   Complete by: As directed    Increase activity slowly   Complete by: As directed       Allergies as of 12/10/2022   No Known Allergies      Medication List     STOP taking these medications    metoprolol tartrate 25 MG tablet Commonly known as: LOPRESSOR       TAKE these medications    Aspirin 81 MG Caps Take 81 mg by mouth daily.   CORICIDIN HBP COLD/COUGH/FLU PO Take 1 tablet by mouth every 6 (six) hours as needed (cold or coughing).  dapagliflozin propanediol 10 MG Tabs tablet Commonly known as: Farxiga Take 1 tablet (10 mg total) by mouth daily before breakfast.   rivaroxaban 20 MG Tabs tablet Commonly known as: Xarelto Take 1 tablet (20 mg total) by mouth daily with supper.   rosuvastatin 20 MG tablet Commonly known as: CRESTOR TAKE 1 TABLET BY MOUTH DAILY AT  BEDTIME   sacubitril-valsartan 24-26 MG Commonly known as: ENTRESTO Take 1 tablet by mouth 2 (two) times daily.  Resume on 12/11/2022 Start taking on: December 11, 2022 What changed: additional instructions   spironolactone 25 MG tablet Commonly known as: ALDACTONE Take 1 tablet (25 mg total) by mouth daily.   torsemide 20 MG tablet Commonly known as: DEMADEX Take 1/2 tablet (10 mg total) by mouth as needed (take if you notice weight gain, 3lbs in 1day or 5lbs in 1 week). What changed: See the new instructions.       No Known Allergies  Follow-up Information     [name] Lerner, DO Follow up on 12/17/2022.   Specialties: Cardiology, Vascular Surgery Why: 2:45 PM Contact information: [address] [place] [address] [phone]                  The results of significant diagnostics from this hospitalization (including imaging, microbiology, ancillary and laboratory) are listed below for reference.    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 Recommend repeat echocardiogram to evaluate for any presence of MR (tall "v" waves can be seen in diastolic heart failure or mitral regurgitation). After aggressive diuresis, if symptoms remain, could consider repeating RHC to r/o any other forms of PH-especially Grp I. That said, Grp 1 PH/PAH is quite unlikely in a patient with biatrial dilatation with longstanding Afib and CAD. Recommend IV lasix 40 mg bid for next 1-2 days. Hold PO torsemide for now. [name] Negus, MD Pager: [phone] Office: [phone]  ECHOCARDIOGRAM LIMITED  Result Date: 12/07/2022    ECHOCARDIOGRAM LIMITED REPORT   Patient Name:   [name] Rice Date of Exam: 12/07/2022 Medical Rec #:  [id number]      Height:       73.0 in Accession #:    [id number]     Weight:       240.3 lb Date of Birth:   08/01/1951      BSA:          2.326 m Patient Age:    71 years       BP:           120/60 mmHg Patient Gender: M              HR:           65 bpm. Exam Location:  Inpatient Procedure: Limited Echo and Limited Color Doppler Indications:    Dyspnea R06.00  History:        Patient has prior history of Echocardiogram examinations, most                 recent 09/27/2020. CHF, CAD, Prior CABG, Arrythmias:Atrial                 Fibrillation, Bradycardia and LBBB, Signs/Symptoms:Dyspnea; Risk  Factors:Hypertension. CKD, stage III.  Sonographer:    Ronny Flurry Referring Phys: [id number] SUNIT TOLIA IMPRESSIONS  1. Left ventricular ejection fraction, by estimation, is 60 to 65%. The left ventricle has normal function. The left ventricle has no regional wall motion abnormalities. Left ventricular diastolic function could not be evaluated.  2. Right ventricular systolic function is mildly reduced. The right ventricular size is normal. There is mildly elevated pulmonary artery systolic pressure. The estimated right ventricular systolic pressure is 64.3 mmHg.  3. The mitral valve is degenerative. Trivial mitral valve regurgitation. No evidence of mitral stenosis. Moderate mitral annular calcification.  4. The aortic valve is calcified. Aortic valve regurgitation is not visualized. Aortic valve sclerosis is present, with no evidence of aortic valve stenosis.  5. The inferior vena cava is normal in size with greater than 50% respiratory variability, suggesting right atrial pressure of 3 mmHg. Comparison(s): No significant change from prior study. FINDINGS  Left Ventricle: Left ventricular ejection fraction, by estimation, is 60 to 65%. The left ventricle has normal function. The left ventricle has no regional wall motion abnormalities. The left ventricular internal cavity size was normal in size. There is  no left ventricular hypertrophy. Abnormal (paradoxical) septal motion consistent with post-operative status.  Left ventricular diastolic function could not be evaluated. Right Ventricle: The right ventricular size is normal. No increase in right ventricular wall thickness. Right ventricular systolic function is mildly reduced. There is mildly elevated pulmonary artery systolic pressure. The tricuspid regurgitant velocity  is 2.92 m/s, and with an assumed right atrial pressure of 3 mmHg, the estimated right ventricular systolic pressure is 32.9 mmHg. Pericardium: There is no evidence of pericardial effusion. Mitral Valve: The mitral valve is degenerative in appearance. Moderate mitral annular calcification. Trivial mitral valve regurgitation. No evidence of mitral valve stenosis. Tricuspid Valve: The tricuspid valve is grossly normal. Tricuspid valve regurgitation is trivial. No evidence of tricuspid stenosis. Aortic Valve: The aortic valve is calcified. Aortic valve regurgitation is not visualized. Aortic valve sclerosis is present, with no evidence of aortic valve stenosis. Aorta: The aortic root and ascending aorta are structurally normal, with no evidence of dilitation. Venous: The inferior vena cava is normal in size with greater than 50% respiratory variability, suggesting right atrial pressure of 3 mmHg. LEFT VENTRICLE PLAX 2D LVIDd:         6.40 cm LVIDs:         4.70 cm LV PW:         0.90 cm LV IVS:        1.10 cm LVOT diam:     2.10 cm LVOT Area:     3.46 cm  IVC IVC diam: 1.60 cm LEFT ATRIUM         Index LA diam:    5.50 cm 2.36 cm/m   AORTA Ao Root diam: 3.50 cm Ao Asc diam:  3.40 cm TRICUSPID VALVE TR Peak grad:   34.1 mmHg TR Vmax:        292.00 cm/s  SHUNTS Systemic Diam: 2.10 cm Eleonore Chiquito MD Electronically signed by Eleonore Chiquito MD Signature Date/Time: 12/07/2022/2:17:17 PM    Final    DG Chest Port 1 View  Result Date: 12/05/2022 CLINICAL DATA:  Shortness of breath and chest pain for 2 weeks, initial encounter EXAM: PORTABLE CHEST 1 VIEW COMPARISON:  12/30/2021 FINDINGS: Cardiac shadow is stable.  Postsurgical changes are again seen. Lungs are well aerated bilaterally. Mild scarring in the left base is seen and stable. No bony  abnormality is noted. IMPRESSION: No active disease. Electronically Signed   By: Inez Catalina M.D.   On: 12/05/2022 17:39    Microbiology: Recent Results (from the past 240 hour(s))  Resp panel by RT-PCR (RSV, Flu A&B, Covid) Nasopharyngeal Swab     Status: None   Collection Time: 12/05/22  5:11 PM   Specimen: Nasopharyngeal Swab; Nasal Swab  Result Value Ref Range Status   SARS Coronavirus 2 by RT PCR NEGATIVE NEGATIVE Final    Comment: (NOTE) SARS-CoV-2 target nucleic acids are NOT DETECTED.  The SARS-CoV-2 RNA is generally detectable in upper respiratory specimens during the acute phase of infection. The lowest concentration of SARS-CoV-2 viral copies this assay can detect is 138 copies/mL. A negative result does not preclude SARS-Cov-2 infection and should not be used as the sole basis for treatment or other patient management decisions. A negative result may occur with  improper specimen collection/handling, submission of specimen other than nasopharyngeal swab, presence of viral mutation(s) within the areas targeted by this assay, and inadequate number of viral copies(<138 copies/mL). A negative result must be combined with clinical observations, patient history, and epidemiological information. The expected result is Negative.  Fact Sheet for Patients:  EntrepreneurPulse.com.au  Fact Sheet for Healthcare Providers:  IncredibleEmployment.be  This test is no t yet approved or cleared by the [place] FDA and  has been authorized for detection and/or diagnosis of SARS-CoV-2 by FDA under an Emergency Use Authorization (EUA). This EUA will remain  in effect (meaning this test can be used) for the duration of the COVID-19 declaration under Section 564(b)(1) of the Act, 21 U.S.C.section 360bbb-3(b)(1), unless the  authorization is terminated  or revoked sooner.       Influenza A by PCR NEGATIVE NEGATIVE Final   Influenza B by PCR NEGATIVE NEGATIVE Final    Comment: (NOTE) The Xpert Xpress SARS-CoV-2/FLU/RSV plus assay is intended as an aid in the diagnosis of influenza from Nasopharyngeal swab specimens and should not be used as a sole basis for treatment. Nasal washings and aspirates are unacceptable for Xpert Xpress SARS-CoV-2/FLU/RSV testing.  Fact Sheet for Patients: EntrepreneurPulse.com.au  Fact Sheet for Healthcare Providers: IncredibleEmployment.be  This test is not yet approved or cleared by the [place] FDA and has been authorized for detection and/or diagnosis of SARS-CoV-2 by FDA under an Emergency Use Authorization (EUA). This EUA will remain in effect (meaning this test can be used) for the duration of the COVID-19 declaration under Section 564(b)(1) of the Act, 21 U.S.C. section 360bbb-3(b)(1), unless the authorization is terminated or revoked.     Resp Syncytial Virus by PCR NEGATIVE NEGATIVE Final    Comment: (NOTE) Fact Sheet for Patients: EntrepreneurPulse.com.au  Fact Sheet for Healthcare Providers: IncredibleEmployment.be  This test is not yet approved or cleared by the [place] FDA and has been authorized for detection and/or diagnosis of SARS-CoV-2 by FDA under an Emergency Use Authorization (EUA). This EUA will remain in effect (meaning this test can be used) for the duration of the COVID-19 declaration under Section 564(b)(1) of the Act, 21 U.S.C. section 360bbb-3(b)(1), unless the authorization is terminated or revoked.  Performed at [facility], [address] [place], [place] [address]      Labs: Basic Metabolic Panel: Recent Labs  Lab 12/05/22 1811 12/07/22 0135 12/07/22 1622 12/08/22 0126 12/09/22 0037 12/10/22 0144  NA 135 137 142  142 137 137  135  K 3.5 3.1* 3.3*  3.3* 4.1 3.7 3.6  CL  104 103  --  103 99 99  CO2 23 23  --  23 29 29   GLUCOSE 98 99  --  99 102* 98  BUN 17 13  --  13 15 21   CREATININE 1.30* 1.21  --  1.22 1.67* 1.45*  CALCIUM 8.1* 8.3*  --  8.3* 8.5* 8.5*  MG 2.3  --   --  2.2  --   --    Liver Function Tests: Recent Labs  Lab 12/05/22 1811  AST 18  ALT 7  ALKPHOS 113  BILITOT 2.6*  PROT 7.1  ALBUMIN 3.6   No results for input(s): "LIPASE", "AMYLASE" in the last 168 hours. No results for input(s): "AMMONIA" in the last 168 hours. CBC: Recent Labs  Lab 12/05/22 1811 12/07/22 0135 12/07/22 1622 12/08/22 0126  WBC 6.0 5.2  --  5.2  NEUTROABS 4.0  --   --   --   HGB 9.4* 8.6* 10.9*  10.9* 9.5*  HCT 33.5* 32.1* 32.0*  32.0* 32.9*  MCV 74.9* 76.4*  --  75.5*  PLT 254 247  --  207   Cardiac Enzymes: No results for input(s): "CKTOTAL", "CKMB", "CKMBINDEX", "TROPONINI" in the last 168 hours. BNP: BNP (last 3 results) Recent Labs    04/29/22 1319 08/05/22 1126 12/05/22 1811  BNP 1,243.5* 654.3* 1,483.3*    ProBNP (last 3 results) Recent Labs    06/23/22 1148 07/09/22 1049 09/02/22 0958  PROBNP 7,015* 4,649* 3,271*    CBG: No results for input(s): "GLUCAP" in the last 168 hours.     Signed:  Domenic Polite MD.  [facility] 12/10/2022, 2:43 PM

## 2022-12-10 NOTE — Plan of Care (Signed)
  Problem: Nutrition: Goal: Adequate nutrition will be maintained Outcome: Completed/Met   Problem: Pain Managment: Goal: General experience of comfort will improve Outcome: Completed/Met   

## 2022-12-13 ENCOUNTER — Telehealth: Payer: Self-pay

## 2022-12-13 NOTE — Telephone Encounter (Signed)
Location of hospitalization: [facility] Reason for hospitalization: SOB Date of discharge: 12/10/2022 Date of first communication with patient: today Person contacting patient: Me Current symptoms: None Do you understand why you were in the Hospital: Yes Questions regarding discharge instructions: None Where were you discharged to: Home Medications reviewed: Yes Allergies reviewed: Yes Dietary changes reviewed: Yes. Discussed low fat and low salt diet.  Referals reviewed: NA Activities of Daily Living: Able to with mild limitations Any transportation issues/concerns: None Any patient concerns: None Confirmed importance & date/time of Follow up appt: Yes Confirmed with patient if condition begins to worsen call. Pt was given the office number and encouraged to call back with questions or concerns: Yes

## 2022-12-15 ENCOUNTER — Other Ambulatory Visit: Payer: Self-pay

## 2022-12-15 DIAGNOSIS — I119 Hypertensive heart disease without heart failure: Secondary | ICD-10-CM

## 2022-12-15 DIAGNOSIS — I5022 Chronic systolic (congestive) heart failure: Secondary | ICD-10-CM

## 2022-12-15 DIAGNOSIS — I4821 Permanent atrial fibrillation: Secondary | ICD-10-CM

## 2022-12-15 NOTE — Telephone Encounter (Signed)
Ok done

## 2022-12-15 NOTE — Telephone Encounter (Signed)
Please order BMP,NT-proBNP, and MG to be done tomorrow and will see him on Friday.   [name] Rice [place], DO, [facility]

## 2022-12-17 ENCOUNTER — Ambulatory Visit: Payer: Medicare Other | Admitting: Cardiology

## 2022-12-17 ENCOUNTER — Encounter: Payer: Self-pay | Admitting: Cardiology

## 2022-12-17 VITALS — BP 163/67 | HR 93 | Resp 18 | Ht 73.0 in | Wt 230.8 lb

## 2022-12-17 DIAGNOSIS — Z9889 Other specified postprocedural states: Secondary | ICD-10-CM

## 2022-12-17 DIAGNOSIS — I4821 Permanent atrial fibrillation: Secondary | ICD-10-CM

## 2022-12-17 DIAGNOSIS — I5022 Chronic systolic (congestive) heart failure: Secondary | ICD-10-CM

## 2022-12-17 DIAGNOSIS — I6523 Occlusion and stenosis of bilateral carotid arteries: Secondary | ICD-10-CM

## 2022-12-17 DIAGNOSIS — I119 Hypertensive heart disease without heart failure: Secondary | ICD-10-CM

## 2022-12-17 DIAGNOSIS — Z7901 Long term (current) use of anticoagulants: Secondary | ICD-10-CM

## 2022-12-17 DIAGNOSIS — Z951 Presence of aortocoronary bypass graft: Secondary | ICD-10-CM

## 2022-12-17 DIAGNOSIS — Z8679 Personal history of other diseases of the circulatory system: Secondary | ICD-10-CM

## 2022-12-17 DIAGNOSIS — E7800 Pure hypercholesterolemia, unspecified: Secondary | ICD-10-CM

## 2022-12-17 LAB — PRO B NATRIURETIC PEPTIDE: NT-Pro BNP: 1342 pg/mL — ABNORMAL HIGH (ref 0–376)

## 2022-12-17 LAB — BASIC METABOLIC PANEL
BUN/Creatinine Ratio: 13 (ref 10–24)
BUN: 15 mg/dL (ref 8–27)
CO2: 21 mmol/L (ref 20–29)
Calcium: 9.1 mg/dL (ref 8.6–10.2)
Chloride: 104 mmol/L (ref 96–106)
Creatinine, Ser: 1.15 mg/dL (ref 0.76–1.27)
Glucose: 72 mg/dL (ref 70–99)
Potassium: 4.8 mmol/L (ref 3.5–5.2)
Sodium: 141 mmol/L (ref 134–144)
eGFR: 69 mL/min/{1.73_m2} (ref >59)

## 2022-12-17 LAB — MAGNESIUM: Magnesium: 2.8 mg/dL — ABNORMAL HIGH (ref 1.6–2.3)

## 2022-12-17 NOTE — Progress Notes (Unsigned)
[name] Rice Date of Birth: 09-12-1951 MRN: [id number] Primary Care Provider:Pcp, No Former Cardiology Providers: Dr. Marthenia Rolling, APRN, FNP-C Primary Cardiologist: Rex Kras, DO, [facility] (established care 01/14/2020)  Date: 12/17/22 Last Office Visit: 06/28/2022  Chief Complaint  Patient presents with   Transitions Of Care   Congestive Heart Failure   HPI  [name] Rice is a 72 y.o.  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  Patient went to the ED in June 2023 for shortness of breath and since then has NT proBNP has been elevated and symptoms consistent with heart failure.  Since last office visit he had an echocardiogram which notes improvement in LVEF and findings to suggest HFpEF.  Patient has done well on Farxiga but unable to afford it due to it being cost prohibitive.  Patient is currently approved for Farxiga with patient assistance and awaiting medication delivery.  Most recent labs from August 16th, 2023 independently reviewed.  Patient has been enrolled into principal care management for heart failure and blood pressure and weight log reviewed.  Patient is SBP around 145 mmHg and weight around 245 pounds.   Patient denies orthopnea, paroxysmal nocturnal dyspnea or lower extremity swelling.  But does have shortness of breath with over exertional activities.  Since the initiation of Lasix and Wilder Glade he is doing well.  , 7 L negative, weight down  15lbs,   ALLERGIES: No Known Allergies  MEDICATION LIST PRIOR TO VISIT: Current Outpatient Medications on File Prior to Visit  Medication Sig Dispense Refill   Acetaminophen-DM (CORICIDIN HBP COLD/COUGH/FLU PO) Take 1 tablet by mouth every 6 (six) hours as needed (cold or coughing).     Aspirin 81 MG CAPS Take 81 mg by mouth daily. 30 capsule 6   dapagliflozin propanediol (FARXIGA) 10 MG TABS tablet Take 1 tablet (10 mg total) by mouth daily before breakfast. 90 tablet 3   rivaroxaban (XARELTO) 20 MG TABS tablet Take 1 tablet (20 mg total) by mouth daily with supper. 90 tablet 3   rosuvastatin (CRESTOR) 20 MG tablet TAKE 1 TABLET BY MOUTH DAILY AT  BEDTIME 100 tablet 2   sacubitril-valsartan (ENTRESTO) 24-26 MG Take 1 tablet by mouth 2 (two) times daily. Resume on 12/11/2022 60 tablet 0   spironolactone (ALDACTONE) 25 MG tablet Take 1 tablet (25 mg total) by mouth daily. 30 tablet 0   torsemide (DEMADEX) 20 MG tablet Take 1/2 tablet (10 mg total) by mouth as needed (take if you notice weight gain, 3lbs in 1day or 5lbs in 1 week). 30 tablet 0   No current facility-administered medications on file prior to visit.    PAST MEDICAL HISTORY: Past Medical History:  Diagnosis Date   Atrial fibrillation ([place])    Carotid artery stenosis    Chronic diastolic (congestive) heart failure (HCC)    Coronary artery disease    Dysrhythmia    Hyperlipidemia    Hypertension    LBBB (left bundle branch block)     PAST SURGICAL HISTORY: Past Surgical History:  Procedure Laterality Date   CARDIOVERSION N/A 09/28/2016   Procedure: CARDIOVERSION;  Surgeon: Adrian Prows,  MD;  Location: [place];  Service: Cardiovascular;  Laterality: N/A;   CATARACT EXTRACTION, BILATERAL     CHEST TUBE INSERTION N/A 06/24/2021   Procedure: CHEST TUBE INSERTION;  Surgeon: Freddi Starr, MD;  Location: [facility] ENDOSCOPY;  Service: Pulmonary;  Laterality: N/A;   CHOLECYSTECTOMY N/A 09/18/2020   Procedure: LAPAROSCOPIC  PARTIAL CHOLECYSTECTOMY;  Surgeon: Alphonsa Overall, MD;  Location: [facility];  Service: General;  Laterality: N/A;   CLIPPING OF ATRIAL APPENDAGE Left 07/29/2020   Procedure: CLIPPING OF ATRIAL APPENDAGE;  Surgeon: Wonda Olds, MD;  Location: [place];  Service: Open Heart Surgery;  Laterality: Left;   CORONARY ARTERY BYPASS GRAFT N/A 07/29/2020   Procedure: CORONARY ARTERY BYPASS GRAFTING (CABG) TIMES FOUR USING BILATERAL INTERNAL MAMMARIES AND LEFT RADIAL ARTERY;  Surgeon: Wonda Olds, MD;  Location: [place];  Service: Open Heart Surgery;  Laterality: N/A;   ERCP N/A 09/17/2020   Procedure: ENDOSCOPIC RETROGRADE CHOLANGIOPANCREATOGRAPHY (ERCP);  Surgeon: Ladene Artist, MD;  Location: [facility];  Service: Endoscopy;  Laterality: N/A;   IR THORACENTESIS ASP PLEURAL SPACE W/IMG GUIDE  06/23/2021   LEFT HEART CATH AND CORONARY ANGIOGRAPHY N/A 07/25/2020   Procedure: LEFT HEART CATH AND CORONARY ANGIOGRAPHY;  Surgeon: Adrian Prows, MD;  Location: [place] CV LAB;  Service: Cardiovascular;  Laterality: N/A;   MAZE N/A 07/29/2020   Procedure: MAZE;  Surgeon: Wonda Olds, MD;  Location: [place];  Service: Open Heart Surgery;  Laterality: N/A;   RADIAL ARTERY HARVEST Left 07/29/2020   Procedure: RADIAL ARTERY HARVEST;  Surgeon: Wonda Olds, MD;  Location: [place];  Service: Open Heart Surgery;  Laterality: Left;   REMOVAL OF STONES  09/17/2020   Procedure: REMOVAL OF STONES;  Surgeon: Ladene Artist, MD;  Location: [facility];  Service: Endoscopy;;   RIGHT HEART CATH N/A 12/07/2022   Procedure: RIGHT HEART CATH;  Surgeon: Nigel Mormon, MD;  Location: [place] CV LAB;  Service: Cardiovascular;  Laterality: N/A;   SPHINCTEROTOMY  09/17/2020   Procedure: SPHINCTEROTOMY;  Surgeon: Ladene Artist, MD;  Location: [facility];  Service: Endoscopy;;   TEE WITHOUT CARDIOVERSION N/A 07/29/2020   Procedure: TRANSESOPHAGEAL ECHOCARDIOGRAM (TEE);  Surgeon: Wonda Olds, MD;   Location: [place];  Service: Open Heart Surgery;  Laterality: N/A;    FAMILY HISTORY: The patient's family history includes Heart disease in his father and mother; Hyperlipidemia in his mother; Hypertension in an other family member.   SOCIAL HISTORY:  The patient  reports that he has never smoked. He has never used smokeless tobacco. He reports that he does not drink alcohol and does not use drugs.  Review of Systems  Constitutional: Negative for chills and fever.  HENT:  Negative for hoarse voice and nosebleeds.   Eyes:  Negative for discharge, double vision and pain.  Cardiovascular:  Negative for chest pain, claudication, dyspnea on exertion, leg swelling, near-syncope, orthopnea, palpitations, paroxysmal nocturnal dyspnea and syncope.  Respiratory:  Negative for cough, hemoptysis and shortness of breath.   Musculoskeletal:  Negative for muscle cramps and myalgias.  Gastrointestinal:  Negative for abdominal pain, constipation, diarrhea, hematemesis, hematochezia, melena, nausea and vomiting.  Neurological:  Negative for dizziness and light-headedness.     PHYSICAL EXAM:    12/17/2022   10:10 AM 12/10/2022    8:44 AM 12/10/2022    4:37 AM  Vitals with BMI  Height 6' 1"$     Weight 230 lbs 13 oz  230 lbs 11 oz  BMI  [id number]  [id number]  Systolic [id number]   Diastolic 67 49   Pulse 93     CONSTITUTIONAL: Appears older than stated age, hemodynamically stable, no acute distress.   SKIN: Skin is warm and dry. No rash noted. No cyanosis. No pallor. No jaundice HEAD: Normocephalic and atraumatic.  EYES: No scleral icterus MOUTH/THROAT: Moist oral membranes.  Poor oral dentition. NECK: No JVD present. No thyromegaly noted.  Mild bilateral carotid bruits  CHEST Normal respiratory effort. No intercostal retractions.  Sternotomy site healed well. LUNGS: Clear to auscultation bilaterally.  No wheezes rales or rhonchi's.   CARDIOVASCULAR: Irregularly irregular, positive S1-S2, no murmurs rubs or gallops  appreciated. ABDOMINAL: Soft, nontender, nondistended, positive bowel sounds in all 4 quadrants, no apparent ascites.  EXTREMITIES: No peripheral edema.  Warm to touch. HEMATOLOGIC: No significant bruising. NEUROLOGIC: Oriented to person, place, and time. Nonfocal. Normal muscle tone.  PSYCHIATRIC: Normal mood and affect. Normal behavior. Cooperative   RADIOLOGY: 08/11/2020 CXR:  Persistent left pleural effusion with left base atelectasis. Scattered calcified granulomas noted. Lungs elsewhere clear. Stable cardiac enlargement with postoperative changes.  Aortic Atherosclerosis  CARDIAC DATABASE: EKG: 05/14/2022: Atrial fibrillation, 54 bpm, LVH, IVCD suggestive of left bundle branch block, consider old anteroseptal and lateral infarct.  Coronary artery bypass grafting: 07/29/2020 (by Dr. Orvan Se[facility] at [facility]):  Left Internal Mammary Artery to Distal Left Anterior Descending Coronary Artery; pedicled RIMA Graft to Posterior Descending Coronary Artery; left radial artery  Graft to Obtuse Marginal Branch of Left Circumflex Coronary Artery and ramus intermedius as a sequenced graft. Bi-atrial Maze procedure and left atrial appendage clipping  Echocardiogram: 12/31/2019: 45-50% with regional wall motion abnormalities.  Please refer to the report for additional details.  09/27/2020: LVEF 45-50% with regional wall motion abnormalities, biatrial enlargement, see report for additional details  06/23/2022: Normal LV systolic function with visual EF 60-65%. Left ventricle cavity is normal in size. Moderate concentric hypertrophy of the left ventricle. Normal global wall motion. Indeterminate diastolic filling pattern, elevated LAP. Calculated EF 62%. Left atrial cavity is severely dilated at 4.7 cm. Right atrial cavity is severely dilated. Right ventricle cavity is moderately dilated. Normal right ventricular function. Structurally normal tricuspid valve.  Moderate tricuspid regurgitation. Mild  pulmonary hypertension. RVSP measures 36 mmHg. IVC is dilated with respiratory variation.  Stress Testing:  02/18/2020: No previous exam available for comparison. Lexiscan nuclear stress test performed using 1-day protocol. Stress EKG is non-diagnostic, as this is pharmacological stress test. In addition, rest and stress EKG showed atrial fibrillation with slow ventricular response, anterolateral T wave inversion. Stress LVEF 77%. Normal myocardial perfusion. Low risk study.  Heart Catheterization: 07/25/20:  RCA: Proximal RCA 80% stenosis, large vessel with mild disease in PL and PDA branches.  A secondary PL branch is subtotally occluded. LM: Distal 30-40% stenosis. LAD: LAD diffusely diseased, proximal diffuse 80% followed by tandem 90% stenosis.  Large D1 with moderate diffuse disease and proximal and mid tandem 70 and 80% stenosis.  Has secondary branches and is tortuous. Cx  and RI: Co-dominant Cx with Ostial circumflex 99% involving a moderate sized ramus with ostial 80 to 90% and proximal 90% stenosis. LV: Normal LVEDP.  No pressure gradient across the aortic valve. Patent LIMA and RIMA and RIMA has ostial 20-30% stenosis. Subclavian arteries widely patent.  Right radial diffusely disease by [place]. 7m contrast used.   Carotid duplex: 07/27/2020:  Right Carotid: Velocities in the right ICA are consistent with a 1-39% stenosis.  Left Carotid: Velocities in the  left ICA are consistent with a 60-79% stenosis.  Vertebrals: Bilateral vertebral arteries demonstrate antegrade flow.   08/12/2021: Duplex suggests stenosis in the right internal carotid artery (>=70%).  Duplex suggests stenosis in the right external carotid artery (<50%). Duplex suggests stenosis in the left internal carotid artery (>=70%).  Duplex suggests stenosis in the left external carotid artery (<50%). The PSV internal/common carotid artery ratio is 6.49 on the right and 4.52 on the left consistent with a stenosis of  >70% bilaterally. Antegrade left vertebral artery flow. No significant change compared to 01/13/2021. Follow up in six months is appropriate if clinically indicated.  14 day extended Holter monitor: Dominant rhythm atrial fibrillation (HR between 25-137bpm, avg. 59bpm).  Overall HR 25-197 bpm.  Avg HR 59 bpm. 1519 pauses that were 3 secs or longer.  The longest pause 9.5 sec at 12:36 AM (07/04/2020), followed by 9.4 sec pause at 4:30 AM (07/11/2020), third longest pause 8.9 sec (07/08/2020).  These are auto-triggered events. 20 episodes of NSVT reported.  Longest episode 20 beats in duration for 10.5 seconds at an average rate 116 bpm.  The fastest episode was 6 beats in duration at average rate of 170 bpm.  Total ventricular ectopic burden <1%. Patient activated events: 0.  LABORATORY DATA:    Latest Ref Rng & Units 12/08/2022    1:26 AM 12/07/2022    4:22 PM 12/07/2022    1:35 AM  CBC  WBC 4.0 - 10.5 K/uL 5.2   5.2   Hemoglobin 13.0 - 17.0 g/dL 9.5  10.9    10.9  8.6   Hematocrit 39.0 - 52.0 % 32.9  32.0    32.0  32.1   Platelets 150 - 400 K/uL 207   247        Latest Ref Rng & Units 12/16/2022    9:44 AM 12/10/2022    1:44 AM 12/09/2022   12:37 AM  CMP  Glucose 70 - 99 mg/dL 72  98  102   BUN 8 - 27 mg/dL 15  21  15   $ Creatinine 0.76 - 1.27 mg/dL 1.15  1.45  1.67   Sodium 134 - 144 mmol/L 141  135  137   Potassium 3.5 - 5.2 mmol/L 4.8  3.6  3.7   Chloride 96 - 106 mmol/L 104  99  99   CO2 20 - 29 mmol/L 21  29  29   $ Calcium 8.6 - 10.2 mg/dL 9.1  8.5  8.5     Lipid Panel  Lab Results  Component Value Date   CHOL 82 12/07/2022   HDL 32 (L) 12/07/2022   LDLCALC 39 12/07/2022   LDLDIRECT 38 12/07/2022   TRIG 56 12/07/2022   CHOLHDL 2.6 12/07/2022     Lab Results  Component Value Date   HGBA1C 5.2 07/26/2020   No components found for: "NTPROBNP" Lab Results  Component Value Date   TSH 1.610 11/13/2020   TSH 1.083 09/16/2020   TSH 0.941 07/24/2020    Cardiac Panel  (last 3 results) No results for input(s): "CKTOTAL", "CKMB", "TROPONINIHS", "RELINDX" in the last 72 hours.   IMPRESSION:    ICD-10-CM   1. Chronic systolic (congestive) heart failure (HCC)  I50.22 EKG 12-Lead        RECOMMENDATIONS: [name] Rice is a 72 y.o. male whose past medical history and cardiovascular risk factors include: Multivessel CAD status post four-vessel bypass 07/2020, biatrial Maze procedure, clipping of the left atrial appendage, asymptomatic bilateral carotid artery stenosis,  history of nonsustained ventricular tachycardia and ventricular standstill, persistent atrial fibrillation, hypertension, hyperlipidemia, left bundle branch block, obesity due to excess calories, recently hospitalized for acute cholecystitis status post ERCP, MRCP, and partial laparoscopic cholecystectomy, obesity due to excess calories.  Chronic combined systolic and diastolic heart failure (HCC) Stage C, NYHA class II/III.  We will transition from Lasix to torsemide 20 mg p.o. daily. Patient is awaiting Farxiga 10 mg p.o. daily to be delivered and is approved for patient assistance. Once he is on torsemide and Farxiga for 1 week he is asked to get labs. Physical care management data reviewed including blood pressure and weight log findings noted above. We will start the patient assistance forms for [facility] and if and when approved we will transition him from olmesartan to [facility] as well (if renal function and hemodynamics allow). Outside labs 06/23/2022 independently reviewed and noted above for further reference.  Atherosclerosis of native coronary artery of native heart without angina pectoris / Hx of four-vessel CABG Denies angina pectoris. Continue aspirin and statin therapy Echocardiogram notes improvement in LVEF study independently reviewed and noted above for reference  Permanent atrial fibrillation ([place]) / S/P left atrial appendage ligation / Long term current use of anticoagulant /  S/P Maze operation for atrial fibrillation Rate control: Beta-blocker. Rhythm control: N/A (amiodarone in the past) Thromboembolic prophylaxis: Xarelto.  Estimated Creatinine Clearance: 77 mL/min (by C-G formula based on SCr of 1.15 mg/dL). CHA2DS2-VASc SCORE is 4 which correlates to 4% risk of stroke per year (CHF, HTN, vascular disease, age) Does not endorse evidence of bleeding.  Patient assistance form for Xarelto filled-awaiting decision  Benign hypertension with CKD (chronic kidney disease) stage III ([place]) Office blood pressures are within acceptable range. Medication changes as discussed above. Continue to monitor BP and renal function.  Bilateral carotid artery stenosis: Follows up with vascular surgery.  Upcoming appointment in August 2023.    FINAL MEDICATION LIST END OF ENCOUNTER: No orders of the defined types were placed in this encounter.    Current Outpatient Medications:    Acetaminophen-DM (CORICIDIN HBP COLD/COUGH/FLU PO), Take 1 tablet by mouth every 6 (six) hours as needed (cold or coughing)., Disp: , Rfl:    Aspirin 81 MG CAPS, Take 81 mg by mouth daily., Disp: 30 capsule, Rfl: 6   dapagliflozin propanediol (FARXIGA) 10 MG TABS tablet, Take 1 tablet (10 mg total) by mouth daily before breakfast., Disp: 90 tablet, Rfl: 3   rivaroxaban (XARELTO) 20 MG TABS tablet, Take 1 tablet (20 mg total) by mouth daily with supper., Disp: 90 tablet, Rfl: 3   rosuvastatin (CRESTOR) 20 MG tablet, TAKE 1 TABLET BY MOUTH DAILY AT  BEDTIME, Disp: 100 tablet, Rfl: 2   sacubitril-valsartan (ENTRESTO) 24-26 MG, Take 1 tablet by mouth 2 (two) times daily. Resume on 12/11/2022, Disp: 60 tablet, Rfl: 0   spironolactone (ALDACTONE) 25 MG tablet, Take 1 tablet (25 mg total) by mouth daily., Disp: 30 tablet, Rfl: 0   torsemide (DEMADEX) 20 MG tablet, Take 1/2 tablet (10 mg total) by mouth as needed (take if you notice weight gain, 3lbs in 1day or 5lbs in 1 week)., Disp: 30 tablet, Rfl: 0  Orders  Placed This Encounter  Procedures   EKG 12-Lead    --Continue cardiac medications as reconciled in final medication list. --No follow-ups on file. Or sooner if needed. --Continue follow-up with your primary care physician regarding the management of your other chronic comorbid conditions.  Patient's questions and concerns were addressed to his  satisfaction. He voices understanding of the instructions provided during this encounter.   This note was created using a voice recognition software as a result there may be grammatical errors inadvertently enclosed that do not reflect the nature of this encounter. Every attempt is made to correct such errors.   Rex Kras, [place], [facility]  Pager: [phone] Office: [phone]

## 2022-12-20 ENCOUNTER — Encounter: Payer: Self-pay | Admitting: Cardiology

## 2022-12-24 LAB — BASIC METABOLIC PANEL
BUN/Creatinine Ratio: 14 (ref 10–24)
BUN: 17 mg/dL (ref 8–27)
CO2: 20 mmol/L (ref 20–29)
Calcium: 9 mg/dL (ref 8.6–10.2)
Chloride: 103 mmol/L (ref 96–106)
Creatinine, Ser: 1.21 mg/dL (ref 0.76–1.27)
Glucose: 136 mg/dL — ABNORMAL HIGH (ref 70–99)
Potassium: 4.9 mmol/L (ref 3.5–5.2)
Sodium: 142 mmol/L (ref 134–144)
eGFR: 65 mL/min/{1.73_m2} (ref >59)

## 2022-12-24 LAB — MAGNESIUM: Magnesium: 2.6 mg/dL — ABNORMAL HIGH (ref 1.6–2.3)

## 2022-12-28 LAB — PRO B NATRIURETIC PEPTIDE: NT-Pro BNP: 1333 pg/mL — ABNORMAL HIGH (ref 0–376)

## 2023-01-03 ENCOUNTER — Other Ambulatory Visit: Payer: Self-pay

## 2023-01-03 DIAGNOSIS — I119 Hypertensive heart disease without heart failure: Secondary | ICD-10-CM

## 2023-01-03 MED ORDER — RIVAROXABAN 20 MG PO TABS: 20.0000 mg | ORAL_TABLET | Freq: Every day | ORAL | 3 refills | Status: DC

## 2023-01-05 ENCOUNTER — Other Ambulatory Visit: Payer: Self-pay

## 2023-01-05 DIAGNOSIS — I5033 Acute on chronic diastolic (congestive) heart failure: Secondary | ICD-10-CM

## 2023-01-05 MED ORDER — SPIRONOLACTONE 25 MG PO TABS: 25.0000 mg | ORAL_TABLET | Freq: Every day | ORAL | 5 refills | Status: DC

## 2023-01-05 MED ORDER — ENTRESTO 49-51 MG PO TABS: 1.0000 | ORAL_TABLET | Freq: Two times a day (BID) | ORAL | 5 refills | Status: DC

## 2023-01-17 ENCOUNTER — Other Ambulatory Visit: Payer: Self-pay | Admitting: Cardiology

## 2023-01-17 DIAGNOSIS — E7800 Pure hypercholesterolemia, unspecified: Secondary | ICD-10-CM

## 2023-01-17 DIAGNOSIS — I2510 Atherosclerotic heart disease of native coronary artery without angina pectoris: Secondary | ICD-10-CM

## 2023-01-17 DIAGNOSIS — Z951 Presence of aortocoronary bypass graft: Secondary | ICD-10-CM

## 2023-01-20 ENCOUNTER — Telehealth: Payer: Self-pay

## 2023-01-20 ENCOUNTER — Ambulatory Visit: Payer: Medicare Other | Admitting: Cardiology

## 2023-01-20 ENCOUNTER — Encounter: Payer: Self-pay | Admitting: Cardiology

## 2023-01-20 VITALS — BP 130/76 | HR 78 | Resp 18 | Ht 73.0 in | Wt 230.4 lb

## 2023-01-20 DIAGNOSIS — E7800 Pure hypercholesterolemia, unspecified: Secondary | ICD-10-CM

## 2023-01-20 DIAGNOSIS — I119 Hypertensive heart disease without heart failure: Secondary | ICD-10-CM

## 2023-01-20 DIAGNOSIS — Z951 Presence of aortocoronary bypass graft: Secondary | ICD-10-CM

## 2023-01-20 DIAGNOSIS — I5032 Chronic diastolic (congestive) heart failure: Secondary | ICD-10-CM

## 2023-01-20 DIAGNOSIS — Z7901 Long term (current) use of anticoagulants: Secondary | ICD-10-CM

## 2023-01-20 DIAGNOSIS — I6523 Occlusion and stenosis of bilateral carotid arteries: Secondary | ICD-10-CM

## 2023-01-20 DIAGNOSIS — Z8679 Personal history of other diseases of the circulatory system: Secondary | ICD-10-CM

## 2023-01-20 DIAGNOSIS — I447 Left bundle-branch block, unspecified: Secondary | ICD-10-CM

## 2023-01-20 DIAGNOSIS — Z9889 Other specified postprocedural states: Secondary | ICD-10-CM

## 2023-01-20 DIAGNOSIS — I4819 Other persistent atrial fibrillation: Secondary | ICD-10-CM

## 2023-01-20 DIAGNOSIS — I2510 Atherosclerotic heart disease of native coronary artery without angina pectoris: Secondary | ICD-10-CM

## 2023-01-20 NOTE — Telephone Encounter (Signed)
Patient home blood pressure is well controlled and occasionally has some soft readings. Patient weight has decreased overall.  Systolic Blood Pressure mmHg -- 117.0 ([id number] - [id number]) Diastolic Blood Pressure mmHg -- 63.5 (47.0 - 84.0) Heart Rate bpm -- 64.5 (46.0 - 83.0)  01/19/23 7:46 PM 116 / 64 mmHg 65 bpm  01/18/23 6:36 PM 97 / 53 mmHg 66 bpm  01/17/23 9:08 PM 130 / 65 mmHg 63 bpm  01/16/23 6:52 PM 134 / 77 mmHg 68 bpm  01/15/23 4:00 PM 116 / 61 mmHg 48 bpm  01/14/23 5:17 PM 112 / 59 mmHg 68 bpm  01/12/23 7:37 PM 105 / 53 mmHg 75 bpm  01/12/23 7:10 AM 103 / 47 mmHg 56 bpm  01/11/23 8:42 PM 97 / 55 mmHg 72 Bpm  Weight lb -- 231.7 (229.4 - 234.8)  01/20/23 4:53 AM  230.4 lb    01/19/23 11:26 PM  231.6 lb    01/19/23 4:22 AM  233.0 lb    01/18/23 2:26 PM  232.8 lb    01/18/23 9:59 AM  232.0 lb    01/18/23 2:48 AM  231.6 lb    01/17/23 4:12 PM  231.0 Lb

## 2023-01-20 NOTE — Progress Notes (Signed)
[name] Date of Birth: Dec 16, 1950 MRN: [id number] Primary Care Provider:Pcp, No Former Cardiology Providers: Dr. Marthenia Rolling, APRN, FNP-C Primary Cardiologist: Rex Kras, DO, [facility] (established care 01/14/2020)  Date: 01/20/23 Last Office Visit: 12/17/2022  Chief Complaint  Patient presents with   Congestive Heart Failure   Follow-up    4 weeks    HPI  [name] Rice is a 72 y.o.  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  He presents today for 1 month follow-up visit given his recent hospitalization for acute heart failure exacerbation.  He was hospitalized in January to February 2024 for acute decompensated heart failure and underwent right heart catheterization followed by parenteral diuresis at the time of discharge his weight was 229 pounds.  Since then his been uptitrated follow-up labs note stable renal and NT proBNP proving.  He is enrolled into remote patient monitoring and his blood pressures and daily weights reviewed independently at today's office visit.  His average weight currently is 231 pounds.  He uses torsemide as needed basis and has done well with uptitration of dose.  He denies orthopnea, chronic.  He is able to sing/orchestrate at the choir without any conversational dyspnea   ALLERGIES: No Known Allergies  MEDICATION LIST PRIOR TO VISIT: Current Outpatient Medications on File Prior to Visit   Medication Sig Dispense Refill   Acetaminophen-DM (CORICIDIN HBP COLD/COUGH/FLU PO) Take 1 tablet by mouth every 6 (six) hours as needed (cold or coughing).     Aspirin 81 MG CAPS Take 81 mg by mouth daily. 30 capsule 6   dapagliflozin propanediol (FARXIGA) 10 MG TABS tablet Take 1 tablet (10 mg total) by mouth daily before breakfast. 90 tablet 3   rivaroxaban (XARELTO) 20 MG TABS tablet Take 1 tablet (20 mg total) by mouth daily with supper. 90 tablet 3   rosuvastatin (CRESTOR) 20 MG tablet TAKE 1 TABLET BY MOUTH DAILY AT  BEDTIME 30 tablet 0   sacubitril-valsartan (ENTRESTO) 49-51 MG Take 1 tablet by mouth 2 (two) times daily. 60 tablet 5   spironolactone (ALDACTONE) 25 MG tablet Take 1 tablet (25 mg total) by mouth daily. 30 tablet 5   torsemide (DEMADEX) 20 MG tablet Take 1/2 tablet (10 mg total) by mouth as needed (take if you notice weight gain, 3lbs in 1day or 5lbs in 1 week). 30 tablet 0   No current facility-administered medications on file prior to visit.    PAST MEDICAL HISTORY: Past Medical History:  Diagnosis Date   Atrial fibrillation ([place])    Carotid artery stenosis    Chronic diastolic (congestive) heart failure (HCC)    Coronary artery disease    Dysrhythmia    Hyperlipidemia    Hypertension    LBBB (left bundle branch block)     PAST SURGICAL HISTORY: Past Surgical History:  Procedure Laterality Date   CARDIOVERSION N/A 09/28/2016   Procedure: CARDIOVERSION;  Surgeon: Adrian Prows, MD;  Location: [place];  Service: Cardiovascular;  Laterality: N/A;   CATARACT EXTRACTION, BILATERAL     CHEST TUBE INSERTION N/A 06/24/2021   Procedure: CHEST TUBE INSERTION;  Surgeon: Freddi Starr, MD;  Location: [facility];  Service: Pulmonary;  Laterality: N/A;   CHOLECYSTECTOMY N/A 09/18/2020   Procedure: LAPAROSCOPIC PARTIAL CHOLECYSTECTOMY;  Surgeon: Alphonsa Overall, MD;  Location: [facility];  Service: General;  Laterality: N/A;   CLIPPING OF ATRIAL APPENDAGE Left  07/29/2020   Procedure: CLIPPING OF ATRIAL APPENDAGE;  Surgeon: Wonda Olds, MD;  Location: [place];  Service: Open Heart Surgery;  Laterality: Left;   CORONARY ARTERY BYPASS GRAFT N/A 07/29/2020   Procedure: CORONARY ARTERY BYPASS GRAFTING (CABG) TIMES FOUR USING BILATERAL INTERNAL MAMMARIES AND LEFT RADIAL ARTERY;  Surgeon: Wonda Olds, MD;  Location: [place];  Service: Open Heart Surgery;  Laterality: N/A;   ERCP N/A 09/17/2020   Procedure: ENDOSCOPIC RETROGRADE CHOLANGIOPANCREATOGRAPHY (ERCP);  Surgeon: Ladene Artist, MD;  Location: [facility];  Service: Endoscopy;  Laterality: N/A;   IR THORACENTESIS ASP PLEURAL SPACE W/IMG GUIDE  06/23/2021   LEFT HEART CATH AND CORONARY ANGIOGRAPHY N/A 07/25/2020   Procedure: LEFT HEART CATH AND CORONARY ANGIOGRAPHY;  Surgeon: Adrian Prows, MD;  Location: [place] [facility];  Service: Cardiovascular;  Laterality: N/A;   MAZE N/A 07/29/2020   Procedure: MAZE;  Surgeon: Wonda Olds, MD;  Location: [place];  Service: Open Heart Surgery;  Laterality: N/A;   RADIAL ARTERY HARVEST Left 07/29/2020   Procedure: RADIAL ARTERY HARVEST;  Surgeon: Wonda Olds, MD;  Location: [place];  Service: Open Heart Surgery;  Laterality: Left;   REMOVAL OF STONES  09/17/2020   Procedure: REMOVAL OF STONES;  Surgeon: Ladene Artist, MD;  Location: [facility];  Service: Endoscopy;;   RIGHT HEART CATH N/A 12/07/2022   Procedure: RIGHT HEART CATH;  Surgeon: Nigel Mormon, MD;  Location: [place] [facility];  Service: Cardiovascular;  Laterality: N/A;   SPHINCTEROTOMY  09/17/2020   Procedure: SPHINCTEROTOMY;  Surgeon: Ladene Artist, MD;  Location: [facility];  Service: Endoscopy;;   TEE WITHOUT CARDIOVERSION N/A 07/29/2020   Procedure: TRANSESOPHAGEAL ECHOCARDIOGRAM (TEE);  Surgeon: Wonda Olds, MD;  Location: [place];  Service: Open Heart Surgery;  Laterality: N/A;    FAMILY HISTORY: The patient's family history includes Heart disease in his father  and mother; Hyperlipidemia in his mother; Hypertension in an other family member.   SOCIAL HISTORY:  The patient  reports that he has never smoked. He has never used smokeless tobacco. He reports that he does not drink alcohol and does not use drugs.  Review of Systems  Constitutional: Negative for chills and fever.  HENT:  Negative for hoarse voice and nosebleeds.   Eyes:  Negative for discharge, double vision and pain.  Cardiovascular:  Negative for chest pain, claudication, dyspnea on exertion, leg swelling, near-syncope, orthopnea, palpitations, paroxysmal nocturnal dyspnea and syncope.  Respiratory:  Negative for cough, hemoptysis and shortness of breath.   Musculoskeletal:  Negative for muscle cramps and myalgias.  Gastrointestinal:  Negative for abdominal pain, constipation, diarrhea, hematemesis, hematochezia, melena, nausea and vomiting.  Neurological:  Negative for dizziness and light-headedness.     PHYSICAL EXAM:    01/20/2023    2:29 PM 12/17/2022   10:10 AM 12/10/2022    8:44 AM  Vitals with BMI  Height '6\' 1"'$  '6\' 1"'$    Weight 230 lbs 6 oz 230 lbs 13 oz   BMI [id number]   Systolic [id number]  Diastolic 46 67 49  Pulse 78 93     Physical Exam  Constitutional: No distress. He appears chronically ill.  Appears older than stated age, hemodynamically stable.   HENT:  Poor oral dentition.  Neck: No JVD present.  Cardiovascular: Normal rate, S1 normal, S2 normal, intact distal pulses and normal pulses. An irregularly irregular rhythm present. Exam reveals no gallop, no S3 and no S4.  Murmur heard. Holosystolic murmur is present with a grade of 3/6 at the lower left sternal border. Pulses:      Carotid pulses are  on the right side with bruit and  on the left side with bruit. Pulmonary/Chest: Effort normal and breath sounds normal. No stridor. He has no wheezes. He has no rales.  Sternotomy site well-healed  Abdominal: Soft. Bowel sounds are normal. He exhibits no  distension. There is no abdominal tenderness.  Musculoskeletal:        General: No edema.     Cervical back: Neck supple.  Neurological: He is alert and oriented to person, place, and time. He has intact cranial nerves (2-12).  Skin: Skin is warm and moist.     RADIOLOGY: 08/11/2020 CXR:  Persistent left pleural effusion with left base atelectasis. Scattered calcified granulomas noted. Lungs elsewhere clear. Stable cardiac enlargement with postoperative changes.  Aortic Atherosclerosis  CARDIAC DATABASE: EKG: 05/14/2022: Atrial fibrillation, 54 bpm, LVH, IVCD suggestive of left bundle branch block, consider old anteroseptal and lateral infarct. 01/20/2023: Atrial fibrillation, 61 bpm, IVCD, old anteroseptal infarct   Coronary artery bypass grafting: 07/29/2020 (by Dr. Orvan Seen at [facility]):  Left Internal Mammary Artery to Distal Left Anterior Descending Coronary Artery; pedicled RIMA Graft to Posterior Descending Coronary Artery; left radial artery  Graft to Obtuse Marginal Branch of Left Circumflex Coronary Artery and ramus intermedius as a sequenced graft. Bi-atrial Maze procedure and left atrial appendage clipping  Echocardiogram: 12/31/2019: 45-50% with regional wall motion abnormalities.  Please refer to the report for additional details.  09/27/2020: LVEF 45-50% with regional wall motion abnormalities, biatrial enlargement, see report for additional details  06/23/2022: 60-65%, moderate LVH, biatrial dilatation, right ventricular cavity dilated function normal, moderate TR, RVSP 36 mmHg, IVC dilated  Limited echo 12/07/2022: LVEF 60 to 65%, no regional wall motion abnormalities, RV systolic function mildly reduced size normal, mild PASP 37 mmHg, moderate MAC estimated RAP 3 mmHg  Stress Testing:  02/18/2020: No previous exam available for comparison. Lexiscan nuclear stress test performed using 1-day protocol. Stress EKG is non-diagnostic, as this is pharmacological stress test. In  addition, rest and stress EKG showed atrial fibrillation with slow ventricular response, anterolateral T wave inversion. Stress LVEF 77%. Normal myocardial perfusion. Low risk study.  Heart Catheterization: 07/25/20:  RCA: Proximal RCA 80% stenosis, large vessel with mild disease in PL and PDA branches.  A secondary PL branch is subtotally occluded. LM: Distal 30-40% stenosis. LAD: LAD diffusely diseased, proximal diffuse 80% followed by tandem 90% stenosis.  Large D1 with moderate diffuse disease and proximal and mid tandem 70 and 80% stenosis.  Has secondary branches and is tortuous. Cx  and RI: Co-dominant Cx with Ostial circumflex 99% involving a moderate sized ramus with ostial 80 to 90% and proximal 90% stenosis. LV: Normal LVEDP.  No pressure gradient across the aortic valve. Patent LIMA and RIMA and RIMA has ostial 20-30% stenosis. Subclavian arteries widely patent.  Right radial diffusely disease by [place]. 24m contrast used.   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  Duplex suggests stenosis in the right external carotid artery (<50%). Duplex suggests stenosis in the left internal carotid artery (>=70%).  Duplex suggests stenosis in the left external carotid artery (<50%). The PSV internal/common carotid artery ratio is 6.49 on the right and 4.52 on the left consistent with a stenosis of >70% bilaterally. Antegrade left vertebral artery flow. No significant change compared to 01/13/2021. Follow up in six months is appropriate if clinically indicated.  14 day extended Holter monitor: Dominant rhythm atrial fibrillation (HR between 25-137bpm, avg. 59bpm).  Overall HR 25-197 bpm.  Avg HR 59  bpm. 1519 pauses that were 3 secs or longer.  The longest pause 9.5 sec at 12:36 AM (07/04/2020), followed by 9.4 sec pause at 4:30 AM (07/11/2020), third longest pause 8.9 sec (07/08/2020).  These are auto-triggered events. 20 episodes of NSVT reported.  Longest episode 20 beats in duration for 10.5 seconds at an average rate 116 bpm.  The fastest episode was 6 beats in duration at average rate of 170 bpm.  Total ventricular ectopic burden <1%. Patient activated events: 0.  REMOTE PATIENT MONITORING [id number] Systolic Blood Pressure      mmHg  --          117.0 (99991111 - [id number]) Diastolic Blood Pressure     mmHg  --          63.5 (47.0 - 84.0) Heart Rate      bpm     --          64.5 (46.0 - 83.0)   01/19/23 7:46 PM         116      /           64        mmHg  65        bpm      01/18/23 6:36 PM         97        /           53        mmHg  66        bpm      01/17/23 9:08 PM         130      /           65        mmHg  63        bpm      01/16/23 6:52 PM         134      /           77        mmHg  68        bpm      01/15/23 4:00 PM           116      /           61        mmHg  48        bpm      01/14/23 5:17 PM           112      /           59        mmHg  68        bpm  01/12/23 7:37 PM           105      /           53        mmHg  75        bpm      01/12/23 7:10 AM           103      /           47        mmHg  56        bpm      01/11/23 8:42 PM           97        /           55        mmHg  72        Bpm   Weight            lb         --          231.7 (229.4 - 234.8)   01/20/23 4:53 AM                     230.4   lb                      01/19/23 11:26 PM                   231.6   lb                      01/19/23 4:22 AM                     233.0   lb                      01/18/23 2:26 PM                     232.8   lb                      01/18/23 9:59 AM                     232.0   lb                      01/18/23 2:48 AM                     231.6   lb                      01/17/23 4:12 PM                      231.0   Lb     LABORATORY DATA:    Latest Ref Rng & Units 12/08/2022    1:26 AM 12/07/2022    4:22 PM 12/07/2022    1:35 AM  CBC  WBC 4.0 - 10.5 K/uL 5.2   5.2   Hemoglobin 13.0 - 17.0 g/dL 9.5  10.9    10.9  8.6   Hematocrit 39.0 - 52.0 % 32.9  32.0    32.0  32.1   Platelets 150 - 400 K/uL 207   247  Latest Ref Rng & Units 12/23/2022   10:21 AM 12/16/2022    9:44 AM 12/10/2022    1:44 AM  CMP  Glucose 70 - 99 mg/dL 136  72  98   BUN 8 - 27 mg/dL '17  15  21   '$ Creatinine 0.76 - 1.27 mg/dL 1.21  1.15  1.45   Sodium 134 - 144 mmol/L 142  141  135   Potassium 3.5 - 5.2 mmol/L 4.9  4.8  3.6   Chloride 96 - 106 mmol/L 103  104  99   CO2 20 - 29 mmol/L '20  21  29   '$ Calcium 8.6 - 10.2 mg/dL 9.0  9.1  8.5     Lipid Panel  Lab Results  Component Value Date   CHOL 82 12/07/2022   HDL 32 (L) 12/07/2022   LDLCALC 39 12/07/2022   LDLDIRECT 38 12/07/2022   TRIG 56 12/07/2022   CHOLHDL 2.6 12/07/2022     Lab Results  Component Value Date   HGBA1C 5.2 07/26/2020   No components found for: "NTPROBNP" Lab Results  Component Value Date   TSH 1.610 11/13/2020   TSH 1.083 09/16/2020   TSH 0.941 07/24/2020    Cardiac Panel (last 3 results) No results for input(s): "CKTOTAL", "CKMB", "TROPONINIHS", "RELINDX" in the last 72 hours.   IMPRESSION:    ICD-10-CM   1. Chronic heart failure with preserved ejection fraction (HFpEF) (HCC)  I50.32     2. Atherosclerosis of native coronary artery of native heart without angina pectoris  I25.10     3. Hx of CABG  Z95.1     4. Left bundle branch block  I44.7     5. Persistent atrial fibrillation (HCC)  I48.19 EKG 12-Lead    6. Long term current use of anticoagulant  Z79.01     7. S/P Maze operation for atrial fibrillation  Z98.890    Z86.79     8. S/P left atrial appendage ligation  Z98.890     9. Hypertensive heart disease without heart failure  I11.9     10. Pure hypercholesterolemia  E78.00     11.  Bilateral carotid artery stenosis  I65.23         RECOMMENDATIONS: FABRIZZIO GELHAR is a 72 y.o. 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  Chronic combined systolic and diastolic heart failure (HCC) Stage C, NYHA class II.  Last hospitalization January/February 2024 Discharge weight 229 pounds, currently targeting a goal outpatient weight around 230 pounds Uses torsemide on a as needed basis. Has done well with uptitration of Entresto. Metoprolol has been discontinued in the past due to underlying bradycardia Strict I's and O's and daily weights. Remote patient monitoring data reviewed. Discussed uptitration of Entresto to 97/103 mg p.o. twice daily; however, at times she still continues to have soft blood pressures and therefore we will hold off on this intervention currently.  Atherosclerosis of native coronary artery of native heart without angina pectoris Hx of four-vessel CABG Denies angina pectoris. Continue aspirin and statin therapy No current indication for repeating ischemic workup  Permanent atrial fibrillation ([place])  S/P left atrial appendage ligation Long term current use of anticoagulant S/P Maze operation for atrial fibrillation Rate control: N/A Rhythm control: N/A (amiodarone in the past) Thromboembolic prophylaxis: Xarelto.  Creatinine clearance >50 mL/min CHA2DS2-VASc SCORE is 4 which correlates to 4% risk of stroke per year  (CHF, HTN, vascular disease, age) Does not endorse evidence of bleeding.   Benign hypertension with CKD (chronic kidney disease) stage III ([place]) Home and office blood pressures are well-controlled. Medications reconciled. No changes warranted at this time. Ambulatory blood pressure readings independently reviewed and noted above  Bilateral carotid artery stenosis: Follows up with vascular surgery.   Plans to follow up w/ vascular surgery Dr. Jamelle Haring (August 2024)   FINAL MEDICATION LIST END OF ENCOUNTER: No orders of the defined types were placed in this encounter.    Current Outpatient Medications:    Acetaminophen-DM (CORICIDIN HBP COLD/COUGH/FLU PO), Take 1 tablet by mouth every 6 (six) hours as needed (cold or coughing)., Disp: , Rfl:    Aspirin 81 MG CAPS, Take 81 mg by mouth daily., Disp: 30 capsule, Rfl: 6   dapagliflozin propanediol (FARXIGA) 10 MG TABS tablet, Take 1 tablet (10 mg total) by mouth daily before breakfast., Disp: 90 tablet, Rfl: 3   rivaroxaban (XARELTO) 20 MG TABS tablet, Take 1 tablet (20 mg total) by mouth daily with supper., Disp: 90 tablet, Rfl: 3   rosuvastatin (CRESTOR) 20 MG tablet, TAKE 1 TABLET BY MOUTH DAILY AT  BEDTIME, Disp: 30 tablet, Rfl: 0   sacubitril-valsartan (ENTRESTO) 49-51 MG, Take 1 tablet by mouth 2 (two) times daily., Disp: 60 tablet, Rfl: 5   spironolactone (ALDACTONE) 25 MG tablet, Take 1 tablet (25 mg total) by mouth daily., Disp: 30 tablet, Rfl: 5   torsemide (DEMADEX) 20 MG tablet, Take 1/2 tablet (10 mg total) by mouth as needed (take if you notice weight gain, 3lbs in 1day or 5lbs in 1 week)., Disp: 30 tablet, Rfl: 0  Orders Placed This Encounter  Procedures   EKG 12-Lead    --Continue cardiac medications as reconciled in final medication list. --Return in about 6 months (around 07/23/2023) for Follow up, heart failure management., A. fib. Or sooner if needed. --Continue follow-up with your primary care physician regarding  the management of your other chronic comorbid conditions.  Patient's questions and concerns were addressed to his satisfaction. He voices understanding of the instructions provided during this encounter.   This note was created using a voice recognition software as a result there may be grammatical errors inadvertently enclosed that do not reflect the nature of this encounter. Every attempt is made to correct such errors.   Rex Kras, [place], [facility]  Pager:  [phone] Office: [phone]

## 2023-02-26 ENCOUNTER — Other Ambulatory Visit: Payer: Self-pay | Admitting: Cardiology

## 2023-02-26 DIAGNOSIS — I5042 Chronic combined systolic (congestive) and diastolic (congestive) heart failure: Secondary | ICD-10-CM

## 2023-03-01 IMAGING — DX DG CHEST 2V
2 series · 2 of 2 positions shown · non-contrast
Comparison: July 07, 2021.

CLINICAL DATA: Status post decortication.

EXAM:
CHEST - 2 VIEW

[dg chest 2 view (1 of 2)]
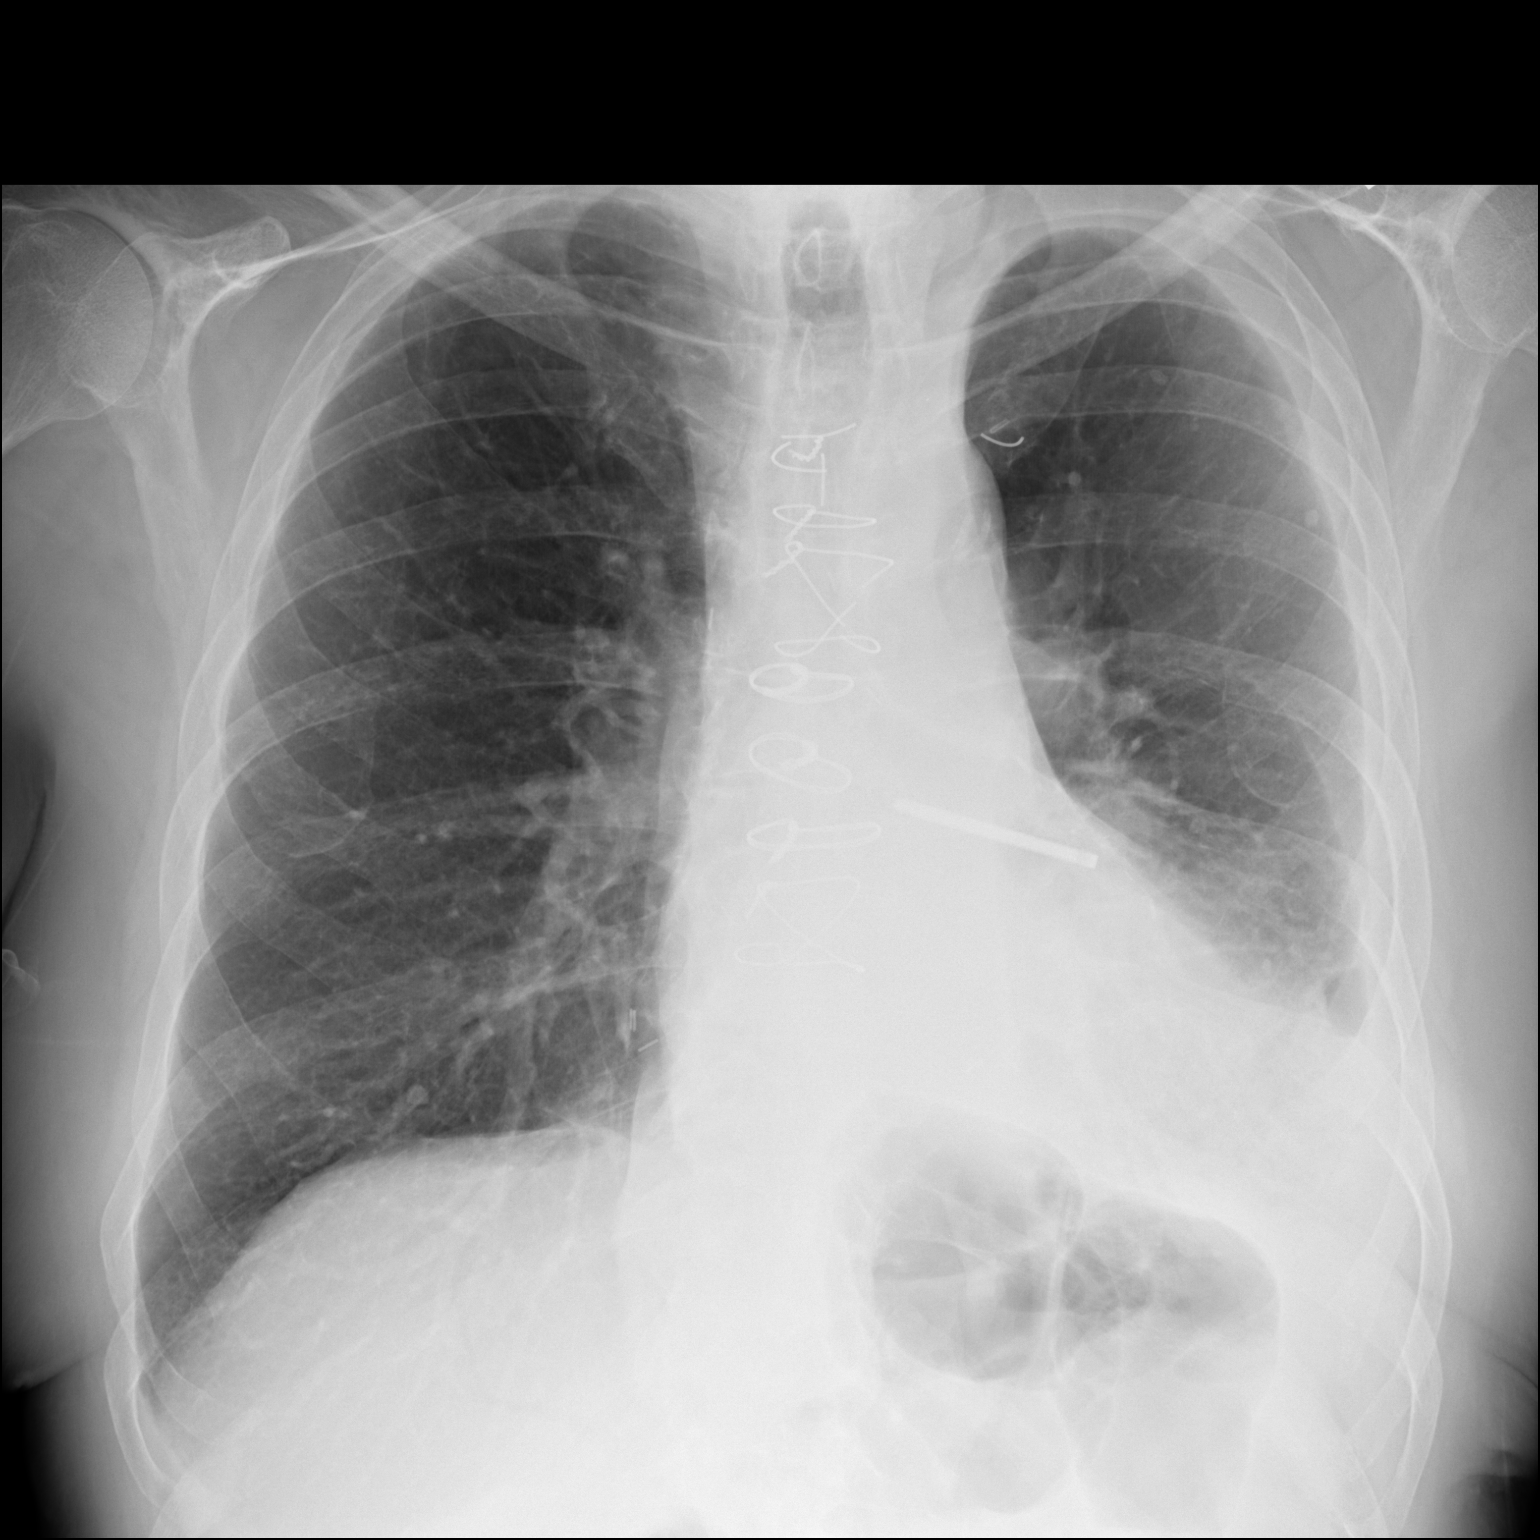

[dg chest 2 view (2 of 2)]
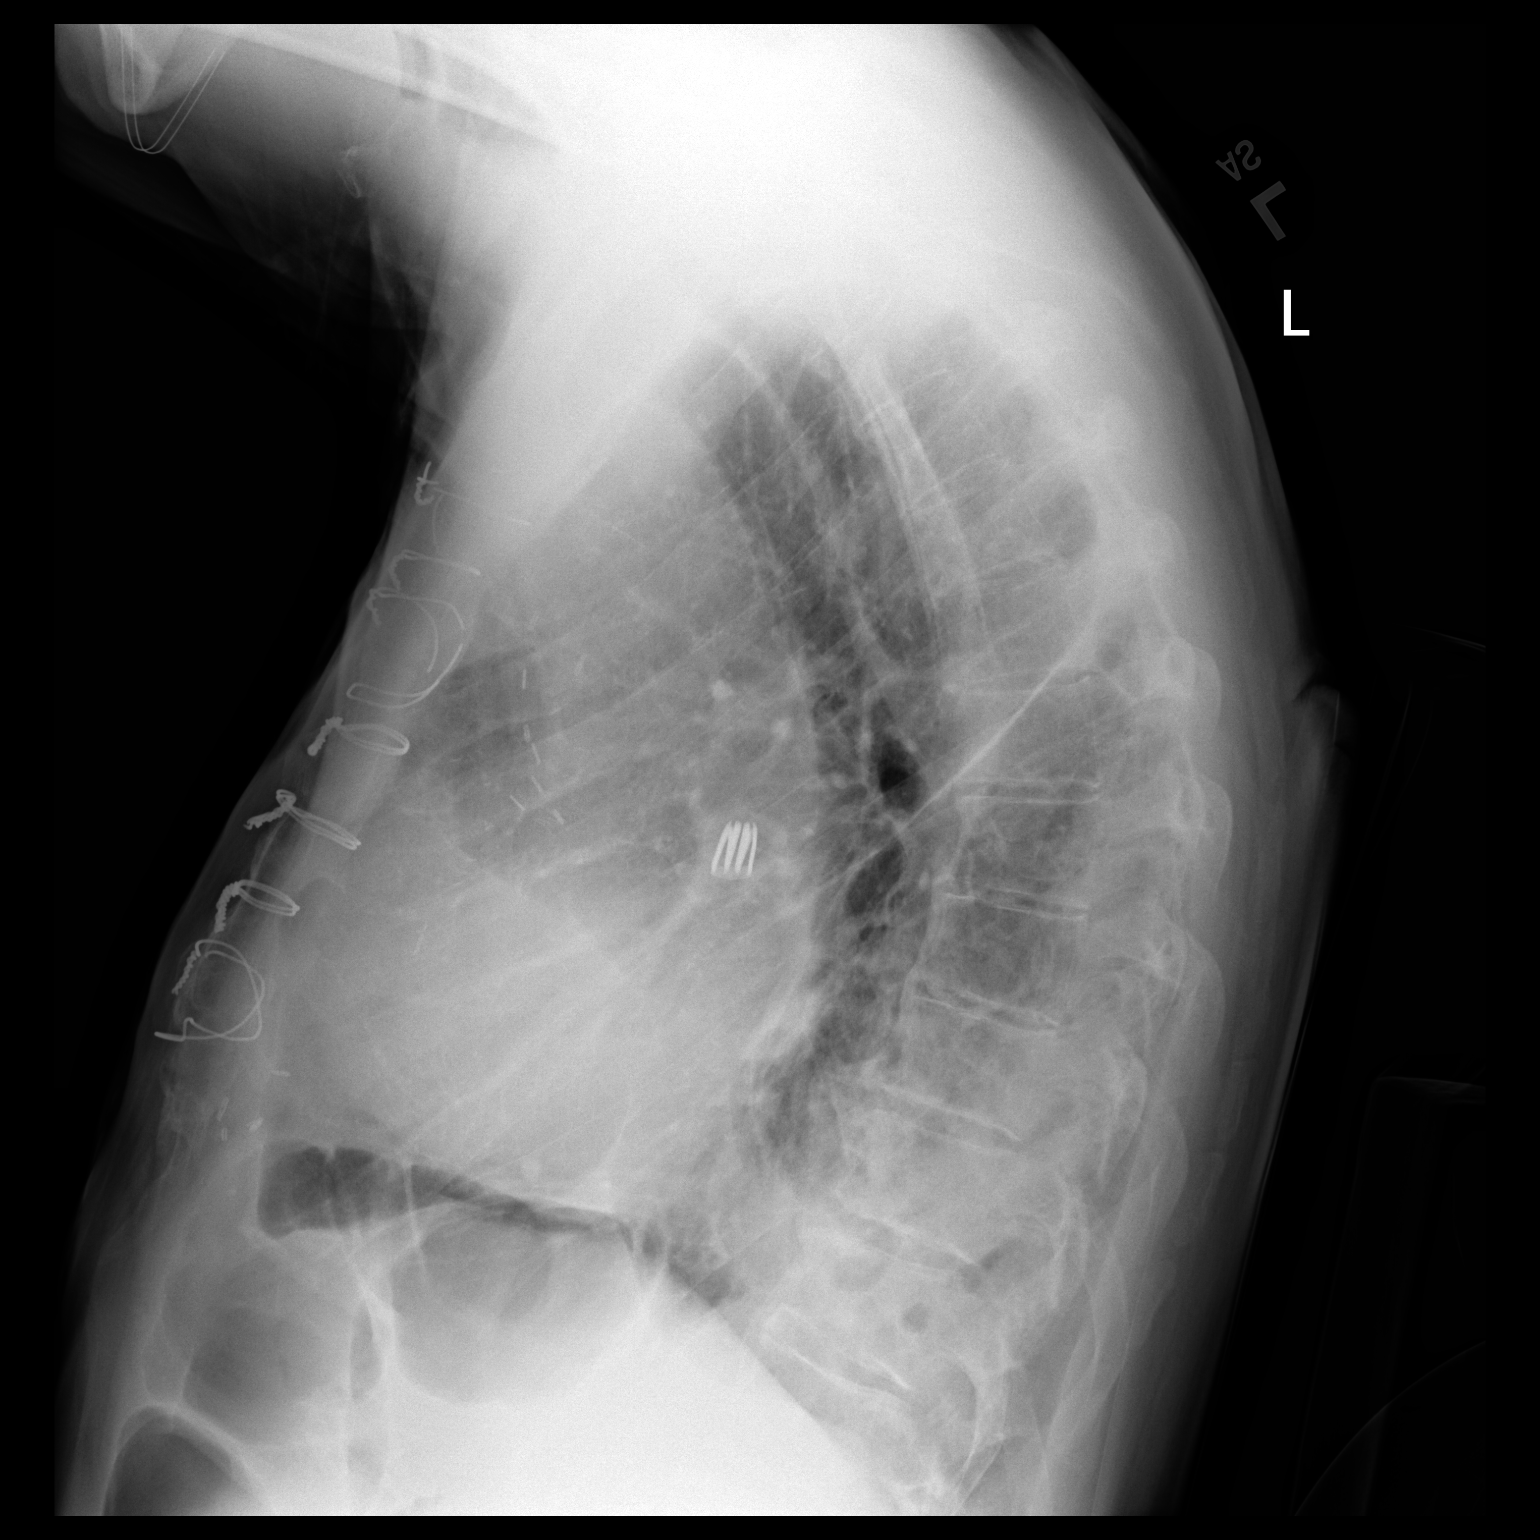

[2 of 2 positions shown; findings below may reference images not displayed]

FINDINGS: The heart size and mediastinal contours are within normal limits.
Status post coronary bypass graft. No pneumothorax is noted. Right
lung is clear. Stable left pleural effusion is noted with associated
atelectasis or scarring. The visualized skeletal structures are
unremarkable.
IMPRESSION: Stable small left pleural effusion is noted with associated left
basilar atelectasis or scarring.

## 2023-03-02 ENCOUNTER — Other Ambulatory Visit: Payer: Self-pay | Admitting: Cardiology

## 2023-03-02 DIAGNOSIS — I5033 Acute on chronic diastolic (congestive) heart failure: Secondary | ICD-10-CM

## 2023-03-03 NOTE — Progress Notes (Signed)
Averages  January 2024 summary Average Systolic BP Level 133.14 mmHg Lowest Systolic BP Level 97 mmHg Highest Systolic BP Level 152 mmHg  Average Weight Level 244.03 lbs Lowest Weight Level 238.4 lbs Highest Weight Level 249.4 lbs  Feb 2024 Systolic Blood Pressure 117.9 (89.0 - 140.0) Diastolic Blood Pressure 63.5 (81.1 - 84.0) Heart Rate  64.7 (51.0 - 79.0) Weight lb -- 230.9 (229.4 - 233.2)  Mar 2024 Systolic Blood Pressure 118.0 (89.0 - 140.0) Diastolic Blood Pressure 63.6 (91.4 - 84.0) Heart Rate  62.1 (43.0 - 83.0) Weight lb -- 231.6 (227.6 - 234.8)  April 2024 Average BP 120/64 Average HR 56 Weight lb -- 231.1 (228.8 - 234.8)    Date Systolic Diastolic Units Heart Rate Units 12/10/22 6:22 PM 122 68 mmHg 76 bpm 12/11/22 10:34 AM 122 64 mmHg 53 bpm 12/11/22 4:57 PM 129 64 mmHg 78 bpm 12/12/22 6:08 PM 132 75 mmHg 76 bpm 12/13/22 4:16 PM 139 69 mmHg 56 bpm 12/14/22 3:31 PM 111 66 mmHg 66 bpm 12/15/22 7:42 PM 89 52 mmHg 57 bpm 12/15/22 7:43 PM 91 52 mmHg 58 bpm 12/15/22 7:50 PM 90 54 mmHg 73 bpm 12/15/22 9:30 PM 132 73 mmHg 79 bpm 12/16/22 4:14 AM 134 65 mmHg 65 bpm 12/16/22 4:19 PM 140 76 mmHg 54 bpm 12/17/22 6:45 PM 127 73 mmHg 59 bpm 12/18/22 6:32 PM 105 58 mmHg 51 bpm 12/19/22 6:17 PM 131 64 mmHg 65 bpm 12/21/22 4:07 AM 95 54 mmHg 59 bpm 12/21/22 12:10 PM 117 63 mmHg 69 bpm 12/21/22 7:05 PM 129 64 mmHg 57 bpm 12/22/22 4:11 PM 114 57 mmHg 58 bpm 12/23/22 6:14 PM 101 52 mmHg 57 bpm 12/24/22 6:35 PM 124 68 mmHg 69 bpm 12/25/22 5:22 PM 135 66 mmHg 61 bpm 12/26/22 6:53 PM 131 70 mmHg 76 bpm 12/27/22 3:55 PM 105 49 mmHg 72 bpm 12/28/22 8:17 PM 129 59 mmHg 64 bpm 12/29/22 8:21 PM 99 63 mmHg 76 bpm 12/30/22 4:07 PM 121 63 mmHg 62 bpm 12/31/22 3:22 PM 119 62 mmHg 70 bpm 01/01/23 7:02 PM 122 84 mmHg 71 bpm 01/02/23 6:16 PM 115 63 mmHg 61 bpm 01/03/23 4:42 PM 104 57 mmHg 57 bpm 01/04/23 4:14 PM 124 79 mmHg 47 bpm 01/06/23 6:20 PM 136 75 mmHg 76 bpm 01/08/23 5:46 AM 110 62 mmHg 83 bpm 01/09/23 3:33  PM 114 63 mmHg 59 bpm 01/10/23 5:11 PM 110 66 mmHg 46 bpm 01/10/23 9:44 PM 126 74 mmHg 69 bpm 01/11/23 8:42 PM 97 55 mmHg 72 bpm 01/12/23 7:10 AM 103 47 mmHg 56 bpm 01/12/23 7:37 PM 105 53 mmHg 75 bpm 01/14/23 5:17 PM 112 59 mmHg 68 bpm 01/15/23 4:00 PM 116 61 mmHg 48 bpm 01/16/23 6:52 PM 134 77 mmHg 68 bpm 01/17/23 9:08 PM 130 65 mmHg 63 bpm 01/18/23 6:36 PM 97 53 mmHg 66 bpm 01/19/23 7:46 PM 116 64 mmHg 65 bpm 01/20/23 9:12 PM 115 58 mmHg 51 bpm 01/21/23 7:51 PM 136 70 mmHg 55 bpm 01/22/23 5:20 PM 111 58 mmHg 50 bpm 01/23/23 5:11 PM 137 77 mmHg 55 bpm 01/24/23 5:00 PM 107 58 mmHg 55 bpm 01/25/23 4:06 PM 140 75 mmHg 46 bpm 01/26/23 8:33 PM 104 59 mmHg 46 bpm 01/27/23 4:22 PM 115 58 mmHg 52 bpm 01/28/23 5:52 PM 122 60 mmHg 43 bpm 01/29/23 5:30 PM 132 70 mmHg 51 bpm 01/30/23 8:32 PM 132 66 mmHg 65 bpm 01/31/23 5:09 PM 110 60 mmHg 66 bpm 02/04/23 8:32 PM 99 54 mmHg 76 bpm  02/05/23 4:40 PM 118 61 mmHg 54 bpm 02/06/23 6:28 PM 124 60 mmHg 60 bpm 02/07/23 4:55 PM 132 63 mmHg 50 bpm 02/08/23 5:33 PM 120 58 mmHg 51 bpm 02/09/23 7:35 PM 117 65 mmHg 50 bpm 02/10/23 3:23 PM 114 65 mmHg 63 bpm 02/11/23 6:01 PM 132 75 mmHg 69 bpm 02/12/23 4:28 PM 127 63 mmHg 54 bpm 02/13/23 9:51 PM 139 77 mmHg 60 bpm 02/14/23 5:18 PM 119 59 mmHg 66 bpm 02/15/23 5:41 PM 129 69 mmHg 52 bpm 02/16/23 4:20 PM 129 63 mmHg 44 bpm 02/17/23 6:28 PM 103 57 mmHg 49 bpm 02/18/23 4:20 PM 111 57 mmHg 61 bpm 02/19/23 5:33 PM 137 72 mmHg 41 bpm 02/20/23 8:23 PM 110 59 mmHg 59 bpm 02/21/23 8:22 PM 112 57 mmHg 49 bpm 02/22/23 6:08 PM 104 54 mmHg 40 bpm 02/23/23 5:46 PM 131 68 mmHg 53 bpm 02/24/23 4:38 PM 116 58 mmHg 53 bpm 02/25/23 4:43 PM 112 62 mmHg 69 bpm 02/26/23 7:08 PM 134 71 mmHg 51 bpm 02/27/23 5:41 PM 113 62 mmHg 57 bpm 02/28/23 8:59 PM 99 53 mmHg 77 bpm 03/01/23 3:50 PM 127 77 mmHg 52 bpm 03/02/23 8:13 PM 118 64 mmHg 67 bpm

## 2023-03-16 ENCOUNTER — Other Ambulatory Visit: Payer: Self-pay | Admitting: Cardiology

## 2023-03-16 DIAGNOSIS — I2510 Atherosclerotic heart disease of native coronary artery without angina pectoris: Secondary | ICD-10-CM

## 2023-03-16 DIAGNOSIS — E7800 Pure hypercholesterolemia, unspecified: Secondary | ICD-10-CM

## 2023-03-16 DIAGNOSIS — Z951 Presence of aortocoronary bypass graft: Secondary | ICD-10-CM

## 2023-03-23 ENCOUNTER — Other Ambulatory Visit: Payer: Self-pay

## 2023-03-23 DIAGNOSIS — Z951 Presence of aortocoronary bypass graft: Secondary | ICD-10-CM

## 2023-03-23 DIAGNOSIS — E7800 Pure hypercholesterolemia, unspecified: Secondary | ICD-10-CM

## 2023-03-23 DIAGNOSIS — I2510 Atherosclerotic heart disease of native coronary artery without angina pectoris: Secondary | ICD-10-CM

## 2023-03-23 MED ORDER — ROSUVASTATIN CALCIUM 20 MG PO TABS: 20.0000 mg | ORAL_TABLET | Freq: Every day | ORAL | 3 refills | Status: DC

## 2023-05-24 ENCOUNTER — Other Ambulatory Visit: Payer: Self-pay | Admitting: *Deleted

## 2023-05-24 DIAGNOSIS — I6523 Occlusion and stenosis of bilateral carotid arteries: Secondary | ICD-10-CM

## 2023-05-25 ENCOUNTER — Other Ambulatory Visit: Payer: Self-pay | Admitting: *Deleted

## 2023-05-25 DIAGNOSIS — I6523 Occlusion and stenosis of bilateral carotid arteries: Secondary | ICD-10-CM

## 2023-06-06 ENCOUNTER — Other Ambulatory Visit: Payer: Self-pay

## 2023-06-06 DIAGNOSIS — I5042 Chronic combined systolic (congestive) and diastolic (congestive) heart failure: Secondary | ICD-10-CM

## 2023-06-06 MED ORDER — DAPAGLIFLOZIN PROPANEDIOL 10 MG PO TABS: 10.0000 mg | ORAL_TABLET | Freq: Every day | ORAL | 3 refills | Status: DC

## 2023-06-14 ENCOUNTER — Ambulatory Visit (HOSPITAL_COMMUNITY): Payer: Medicare Other

## 2023-06-14 ENCOUNTER — Ambulatory Visit: Payer: Medicare Other

## 2023-07-19 ENCOUNTER — Encounter (HOSPITAL_COMMUNITY): Payer: Medicare Other

## 2023-07-19 ENCOUNTER — Ambulatory Visit: Payer: Medicare Other

## 2023-07-20 ENCOUNTER — Other Ambulatory Visit: Payer: Self-pay

## 2023-07-20 DIAGNOSIS — I5033 Acute on chronic diastolic (congestive) heart failure: Secondary | ICD-10-CM

## 2023-07-25 ENCOUNTER — Ambulatory Visit: Payer: Medicare Other | Admitting: Cardiology

## 2023-07-25 ENCOUNTER — Encounter: Payer: Self-pay | Admitting: Cardiology

## 2023-07-25 VITALS — BP 113/78 | HR 77 | Resp 14 | Ht 73.0 in | Wt 230.0 lb

## 2023-07-25 DIAGNOSIS — Z8679 Personal history of other diseases of the circulatory system: Secondary | ICD-10-CM

## 2023-07-25 DIAGNOSIS — I4821 Permanent atrial fibrillation: Secondary | ICD-10-CM

## 2023-07-25 DIAGNOSIS — I447 Left bundle-branch block, unspecified: Secondary | ICD-10-CM

## 2023-07-25 DIAGNOSIS — Z9889 Other specified postprocedural states: Secondary | ICD-10-CM

## 2023-07-25 DIAGNOSIS — I5033 Acute on chronic diastolic (congestive) heart failure: Secondary | ICD-10-CM

## 2023-07-25 DIAGNOSIS — Z951 Presence of aortocoronary bypass graft: Secondary | ICD-10-CM

## 2023-07-25 DIAGNOSIS — I2510 Atherosclerotic heart disease of native coronary artery without angina pectoris: Secondary | ICD-10-CM

## 2023-07-25 DIAGNOSIS — E7800 Pure hypercholesterolemia, unspecified: Secondary | ICD-10-CM

## 2023-07-25 DIAGNOSIS — I5032 Chronic diastolic (congestive) heart failure: Secondary | ICD-10-CM

## 2023-07-25 DIAGNOSIS — Z7901 Long term (current) use of anticoagulants: Secondary | ICD-10-CM

## 2023-07-25 DIAGNOSIS — I6523 Occlusion and stenosis of bilateral carotid arteries: Secondary | ICD-10-CM

## 2023-07-25 DIAGNOSIS — I119 Hypertensive heart disease without heart failure: Secondary | ICD-10-CM

## 2023-07-25 MED ORDER — ENTRESTO 49-51 MG PO TABS: 1.0000 | ORAL_TABLET | Freq: Two times a day (BID) | ORAL | 0 refills | Status: DC

## 2023-07-25 NOTE — Progress Notes (Signed)
[name] Rice Date of Birth: 22-Dec-1950 MRN: [id number] Primary Care Provider:Pcp, No Former Cardiology Providers: Dr. Rudean Hitt, APRN, FNP-C Primary Cardiologist: Tessa Lerner, DO, [facility] (established care 01/14/2020)  Date: 07/25/23 Last Office Visit: 01/21/2023  Chief Complaint  Patient presents with   Follow-up   Congestive Heart Failure   HPI  [name] Rice is a 72 y.o.  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  Patient presents today for 4-month follow-up visit given his history of chronic HFpEF and permanent afibrillation.  His last hospitalization was in February 2024 and since then he has done well with lifestyle changes, using diuretics on a as needed basis, and medication compliance.  Denies any anginal chest pain or heart failure symptoms.  We are targeting a goal of 230 pounds.  And if he gains more than 1 pound over a day or 3 pounds over a week he uses torsemide.    ALLERGIES: No Known Allergies  MEDICATION LIST PRIOR TO VISIT: Current Outpatient Medications on File Prior to Visit  Medication Sig Dispense Refill   Aspirin 81 MG CAPS Take 81 mg by mouth daily. 30 capsule 6   dapagliflozin propanediol (FARXIGA) 10 MG TABS tablet Take 1 tablet (10 mg total) by mouth daily before breakfast. 90 tablet 3   rivaroxaban (XARELTO) 20 MG TABS tablet Take 1 tablet (20 mg total) by mouth daily with supper. 90 tablet 3    rosuvastatin (CRESTOR) 20 MG tablet Take 1 tablet (20 mg total) by mouth at bedtime. 90 tablet 3   spironolactone (ALDACTONE) 25 MG tablet TAKE 1 TABLET BY MOUTH DAILY 100 tablet 2   torsemide (DEMADEX) 20 MG tablet TAKE 1 TABLET BY MOUTH EVERY DAY IN THE MORNING 90 tablet 3   No current facility-administered medications on file prior to visit.    PAST MEDICAL HISTORY: Past Medical History:  Diagnosis Date   Atrial fibrillation (HCC)    Carotid artery stenosis    Chronic diastolic (congestive) heart failure (HCC)    Coronary artery disease    Dysrhythmia    Hyperlipidemia    Hypertension    LBBB (left bundle branch block)     PAST SURGICAL HISTORY: Past Surgical History:  Procedure Laterality Date   CARDIOVERSION N/A 09/28/2016   Procedure: CARDIOVERSION;  Surgeon: Yates Decamp, MD;  Location: [facility] ENDOSCOPY;  Service: Cardiovascular;  Laterality: N/A;   CATARACT EXTRACTION, BILATERAL     CHEST TUBE INSERTION N/A 06/24/2021   Procedure: CHEST TUBE INSERTION;  Surgeon: Martina Sinner, MD;  Location: [facility] ENDOSCOPY;  Service: Pulmonary;  Laterality: N/A;   CHOLECYSTECTOMY N/A 09/18/2020   Procedure: LAPAROSCOPIC PARTIAL CHOLECYSTECTOMY;  Surgeon: Ovidio Kin, MD;  Location: [facility] ORS;  Service: General;  Laterality: N/A;   CLIPPING OF ATRIAL APPENDAGE Left 07/29/2020   Procedure: CLIPPING OF ATRIAL APPENDAGE;  Surgeon: Linden Dolin, MD;  Location: [facility] OR;  Service: Open Heart Surgery;  Laterality: Left;   CORONARY ARTERY BYPASS GRAFT N/A 07/29/2020   Procedure: CORONARY ARTERY BYPASS GRAFTING (CABG) TIMES FOUR USING BILATERAL INTERNAL MAMMARIES AND LEFT RADIAL ARTERY;  Surgeon: Linden Dolin,  MD;  Location: [facility] OR;  Service: Open Heart Surgery;  Laterality: N/A;   ERCP N/A 09/17/2020   Procedure: ENDOSCOPIC RETROGRADE CHOLANGIOPANCREATOGRAPHY (ERCP);  Surgeon: Meryl Dare, MD;  Location: [facility] ENDOSCOPY;  Service: Endoscopy;  Laterality: N/A;   IR THORACENTESIS ASP PLEURAL SPACE  W/IMG GUIDE  06/23/2021   LEFT HEART CATH AND CORONARY ANGIOGRAPHY N/A 07/25/2020   Procedure: LEFT HEART CATH AND CORONARY ANGIOGRAPHY;  Surgeon: Yates Decamp, MD;  Location: [facility] INVASIVE CV LAB;  Service: Cardiovascular;  Laterality: N/A;   MAZE N/A 07/29/2020   Procedure: MAZE;  Surgeon: Linden Dolin, MD;  Location: [facility] OR;  Service: Open Heart Surgery;  Laterality: N/A;   RADIAL ARTERY HARVEST Left 07/29/2020   Procedure: RADIAL ARTERY HARVEST;  Surgeon: Linden Dolin, MD;  Location: [facility] OR;  Service: Open Heart Surgery;  Laterality: Left;   REMOVAL OF STONES  09/17/2020   Procedure: REMOVAL OF STONES;  Surgeon: Meryl Dare, MD;  Location: [facility] ENDOSCOPY;  Service: Endoscopy;;   RIGHT HEART CATH N/A 12/07/2022   Procedure: RIGHT HEART CATH;  Surgeon: [name], MD;  Location: [facility] INVASIVE CV LAB;  Service: Cardiovascular;  Laterality: N/A;   SPHINCTEROTOMY  09/17/2020   Procedure: SPHINCTEROTOMY;  Surgeon: Meryl Dare, MD;  Location: [facility] ENDOSCOPY;  Service: Endoscopy;;   TEE WITHOUT CARDIOVERSION N/A 07/29/2020   Procedure: TRANSESOPHAGEAL ECHOCARDIOGRAM (TEE);  Surgeon: Linden Dolin, MD;  Location: [facility] OR;  Service: Open Heart Surgery;  Laterality: N/A;    FAMILY HISTORY: The patient's family history includes Heart disease in his father and mother; Hyperlipidemia in his mother; Hypertension in an other family member.   SOCIAL HISTORY:  The patient  reports that he has never smoked. He has never used smokeless tobacco. He reports that he does not drink alcohol and does not use drugs.  Review of Systems  Constitutional: Negative for chills and fever.  HENT:  Negative for hoarse voice and nosebleeds.   Eyes:  Negative for discharge, double vision and pain.  Cardiovascular:  Negative for chest pain, claudication, dyspnea on exertion, leg swelling, near-syncope, orthopnea, palpitations, paroxysmal nocturnal dyspnea and syncope.  Respiratory:  Negative for cough,  hemoptysis and shortness of breath.   Musculoskeletal:  Negative for muscle cramps and myalgias.  Gastrointestinal:  Negative for abdominal pain, constipation, diarrhea, hematemesis, hematochezia, melena, nausea and vomiting.  Neurological:  Negative for dizziness and light-headedness.     PHYSICAL EXAM:    07/25/2023    1:21 PM 01/20/2023    2:29 PM 12/17/2022   10:10 AM  Vitals with BMI  Height 6\' 1"  6\' 1"  6\' 1"   Weight 230 lbs 230 lbs 6 oz 230 lbs 13 oz  BMI 30.35 30.4 30.46  Systolic 113 130 643  Diastolic 78 76 67  Pulse 77 78 93    Physical Exam  Constitutional: No distress. He appears chronically ill.  Appears older than stated age, hemodynamically stable.   HENT:  Poor oral dentition.  Neck: No JVD present.  Cardiovascular: Normal rate, S1 normal, S2 normal, intact distal pulses and normal pulses. An irregularly irregular rhythm present. Exam reveals no gallop, no S3 and no S4.  Murmur heard. Holosystolic murmur is present with a grade of 3/6 at the lower left sternal border. Pulses:      Carotid pulses are  on the right side with bruit and  on the left side with bruit. Pulmonary/Chest: Effort normal and breath sounds normal. No stridor. He has no wheezes.  He has no rales.  Sternotomy site well-healed  Abdominal: Soft. Bowel sounds are normal. He exhibits no distension. There is no abdominal tenderness.  Musculoskeletal:        General: No edema.     Cervical back: Neck supple.  Neurological: He is alert and oriented to person, place, and time. He has intact cranial nerves (2-12).  Skin: Skin is warm and moist.     RADIOLOGY: 08/11/2020 CXR:  Persistent left pleural effusion with left base atelectasis. Scattered calcified granulomas noted. Lungs elsewhere clear. Stable cardiac enlargement with postoperative changes.  Aortic Atherosclerosis  CARDIAC DATABASE: EKG: 05/14/2022: Atrial fibrillation, 54 bpm, LVH, IVCD suggestive of left bundle branch block, consider old  anteroseptal and lateral infarct. 01/20/2023: Atrial fibrillation, 61 bpm, IVCD, old anteroseptal infarct 07/25/2023: Atrial fibrillation, 71 bpm, left bundle branch block.   Coronary artery bypass grafting: 07/29/2020 (by Dr. Vickey Sages at [facility]):  Left Internal Mammary Artery to Distal Left Anterior Descending Coronary Artery; pedicled RIMA Graft to Posterior Descending Coronary Artery; left radial artery  Graft to Obtuse Marginal Branch of Left Circumflex Coronary Artery and ramus intermedius as a sequenced graft. Bi-atrial Maze procedure and left atrial appendage clipping  Echocardiogram: 12/31/2019: 45-50% with regional wall motion abnormalities.  Please refer to the report for additional details.  09/27/2020: LVEF 45-50% with regional wall motion abnormalities, biatrial enlargement, see report for additional details  06/23/2022: 60-65%, moderate LVH, biatrial dilatation, right ventricular cavity dilated function normal, moderate TR, RVSP 36 mmHg, IVC dilated  Limited echo 12/07/2022: LVEF 60 to 65%, no regional wall motion abnormalities, RV systolic function mildly reduced size normal, mild PASP 37 mmHg, moderate MAC estimated RAP 3 mmHg  Stress Testing:  02/18/2020: No previous exam available for comparison. Lexiscan nuclear stress test performed using 1-day protocol. Stress EKG is non-diagnostic, as this is pharmacological stress test. In addition, rest and stress EKG showed atrial fibrillation with slow ventricular response, anterolateral T wave inversion. Stress LVEF 77%. Normal myocardial perfusion. Low risk study.  Heart Catheterization: 07/25/20:  RCA: Proximal RCA 80% stenosis, large vessel with mild disease in PL and PDA branches.  A secondary PL branch is subtotally occluded. LM: Distal 30-40% stenosis. LAD: LAD diffusely diseased, proximal diffuse 80% followed by tandem 90% stenosis.  Large D1 with moderate diffuse disease and proximal and mid tandem 70 and 80% stenosis.  Has  secondary branches and is tortuous. Cx  and RI: Co-dominant Cx with Ostial circumflex 99% involving a moderate sized ramus with ostial 80 to 90% and proximal 90% stenosis. LV: Normal LVEDP.  No pressure gradient across the aortic valve. Patent LIMA and RIMA and RIMA has ostial 20-30% stenosis. Subclavian arteries widely patent.  Right radial diffusely disease by [place]. 65mL contrast used.   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  Duplex suggests stenosis in the right external carotid artery (<50%). Duplex suggests stenosis in the left internal carotid artery (>=70%).  Duplex suggests stenosis in the left external carotid artery (<50%). The PSV internal/common carotid artery ratio is 6.49 on the right and 4.52 on the left consistent with a stenosis of >70% bilaterally. Antegrade left vertebral artery flow. No significant change compared to 01/13/2021. Follow up in six months is appropriate  if clinically indicated.  14 day extended Holter monitor: Dominant rhythm atrial fibrillation (HR between 25-137bpm, avg. 59bpm).  Overall HR 25-197 bpm.  Avg HR 59 bpm. 1519 pauses that were 3 secs or longer.  The longest pause 9.5 sec at 12:36 AM (07/04/2020), followed by 9.4 sec pause at 4:30 AM (07/11/2020), third longest pause 8.9 sec (07/08/2020).  These are auto-triggered events. 20 episodes of NSVT reported.  Longest episode 20 beats in duration for 10.5 seconds at an average rate 116 bpm.  The fastest episode was 6 beats in duration at average rate of 170 bpm.  Total ventricular ectopic burden <1%. Patient activated events: 0.  LABORATORY DATA:    Latest Ref Rng & Units 12/08/2022    1:26 AM 12/07/2022     4:22 PM 12/07/2022    1:35 AM  CBC  WBC 4.0 - 10.5 K/uL 5.2   5.2   Hemoglobin 13.0 - 17.0 g/dL 9.5  65.7    84.6  8.6   Hematocrit 39.0 - 52.0 % 32.9  32.0    32.0  32.1   Platelets 150 - 400 K/uL 207   247        Latest Ref Rng & Units 12/23/2022   10:21 AM 12/16/2022    9:44 AM 12/10/2022    1:44 AM  CMP  Glucose 70 - 99 mg/dL 962  72  98   BUN 8 - 27 mg/dL 17  15  21    Creatinine 0.76 - 1.27 mg/dL 9.52  8.41  3.24   Sodium 134 - 144 mmol/L 142  141  135   Potassium 3.5 - 5.2 mmol/L 4.9  4.8  3.6   Chloride 96 - 106 mmol/L 103  104  99   CO2 20 - 29 mmol/L 20  21  29    Calcium 8.6 - 10.2 mg/dL 9.0  9.1  8.5     Lipid Panel  Lab Results  Component Value Date   CHOL 82 12/07/2022   HDL 32 (L) 12/07/2022   LDLCALC 39 12/07/2022   LDLDIRECT 38 12/07/2022   TRIG 56 12/07/2022   CHOLHDL 2.6 12/07/2022     Lab Results  Component Value Date   HGBA1C 5.2 07/26/2020   No components found for: "NTPROBNP" Lab Results  Component Value Date   TSH 1.610 11/13/2020   TSH 1.083 09/16/2020   TSH 0.941 07/24/2020    Cardiac Panel (last 3 results) No results for input(s): "CKTOTAL", "CKMB", "TROPONINIHS", "RELINDX" in the last 72 hours.   IMPRESSION:    ICD-10-CM   1. Chronic heart failure with preserved ejection fraction (HFpEF) (HCC)  I50.32 sacubitril-valsartan (ENTRESTO) 49-51 MG    2. Atherosclerosis of native coronary artery of native heart without angina pectoris  I25.10     3. Hx of CABG  Z95.1     4. Left bundle branch block  I44.7     5. Permanent atrial fibrillation (HCC)  I48.21 EKG 12-Lead    6. Long term current use of anticoagulant  Z79.01     7. S/P Maze operation for atrial fibrillation  Z98.890    Z86.79     8. S/P left atrial appendage ligation  Z98.890     9. Hypertensive heart disease without heart failure  I11.9     10. Pure hypercholesterolemia  E78.00     11. Bilateral carotid artery stenosis  I65.23     12. Acute on chronic diastolic  heart failure (HCC)  I50.33  RECOMMENDATIONS: [name] Rice is a 72 y.o. 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  Chronic combined systolic and diastolic heart failure (HCC) Stage C, NYHA class II.  Last hospitalization January/February 2024 Discharge weight 229 pounds, currently targeting a goal outpatient weight around 230 pounds Uses torsemide on a as needed basis -last used torsemide about 4 days ago. Entresto refilled. Metoprolol discontinued in the past due to bradycardia. Medications reconciled. Monitor for now.  Atherosclerosis of native coronary artery of native heart without angina pectoris Hx of four-vessel CABG Denies anginal chest pain. Continue aspirin and statin therapy. Reemphasized the importance of secondary prevention with focus on improving her modifiable cardiovascular risk factors such as glycemic control, lipid management, blood pressure control, weight loss.  Permanent atrial fibrillation (HCC) S/P left atrial appendage ligation Long term current use of anticoagulant S/P Maze operation for atrial fibrillation Rate control: N/A Rhythm control: N/A (amiodarone in the past) Thromboembolic prophylaxis: Xarelto.  CHA2DS2-VASc SCORE is 4 which correlates to 4% risk of stroke per year (CHF, HTN, vascular disease, age) Does not endorse evidence of bleeding.   Benign hypertension with CKD (chronic kidney disease)  stage III (HCC) Home and office blood pressures are well-controlled. Medications reconciled. No changes warranted at this time. Continue to monitor  Bilateral carotid artery stenosis: Follows up with vascular surgery.   Plans to follow up w/ vascular surgery Dr. Heath Lark.   FINAL MEDICATION LIST END OF ENCOUNTER: Meds ordered this encounter  Medications   sacubitril-valsartan (ENTRESTO) 49-51 MG    Sig: Take 1 tablet by mouth 2 (two) times daily.    Dispense:  180 tablet    Refill:  0    Dose increase from 24-26mg  strength     Current Outpatient Medications:    Aspirin 81 MG CAPS, Take 81 mg by mouth daily., Disp: 30 capsule, Rfl: 6   dapagliflozin propanediol (FARXIGA) 10 MG TABS tablet, Take 1 tablet (10 mg total) by mouth daily before breakfast., Disp: 90 tablet, Rfl: 3   rivaroxaban (XARELTO) 20 MG TABS tablet, Take 1 tablet (20 mg total) by mouth daily with supper., Disp: 90 tablet, Rfl: 3   rosuvastatin (CRESTOR) 20 MG tablet, Take 1 tablet (20 mg total) by mouth at bedtime., Disp: 90 tablet, Rfl: 3   spironolactone (ALDACTONE) 25 MG tablet, TAKE 1 TABLET BY MOUTH DAILY, Disp: 100 tablet, Rfl: 2   torsemide (DEMADEX) 20 MG tablet, TAKE 1 TABLET BY MOUTH EVERY DAY IN THE MORNING, Disp: 90 tablet, Rfl: 3   sacubitril-valsartan (ENTRESTO) 49-51 MG, Take 1 tablet by mouth 2 (two) times daily., Disp: 180 tablet, Rfl: 0  Orders Placed This Encounter  Procedures   EKG 12-Lead    --Continue cardiac medications as reconciled in final medication list. --Return in about 6 months (around 01/22/2024) for Follow up, heart failure management.. Or sooner if needed. --Continue follow-up with your primary care physician regarding the management of your other chronic comorbid conditions.  Patient's questions and concerns were addressed to his satisfaction. He voices understanding of the instructions provided during this encounter.   This note was created using a  voice recognition  software as a result there may be grammatical errors inadvertently enclosed that do not reflect the nature of this encounter. Every attempt is made to correct such errors.  Tessa Lerner, [place], [facility]  Pager:  [phone] Office: [phone]

## 2023-08-16 ENCOUNTER — Ambulatory Visit (HOSPITAL_COMMUNITY)
Admission: RE | Admit: 2023-08-16 | Discharge: 2023-08-16 | Disposition: A | Discharge status: Home / Self Care | Payer: Medicare Other | Source: Ambulatory Visit | Attending: Vascular Surgery | Admitting: Vascular Surgery

## 2023-08-16 ENCOUNTER — Ambulatory Visit: Payer: Medicare Other | Admitting: Physician Assistant

## 2023-08-16 VITALS — BP 124/78 | HR 47 | Temp 97.9°F | Resp 20 | Ht 73.0 in | Wt 241.5 lb

## 2023-08-16 DIAGNOSIS — I6523 Occlusion and stenosis of bilateral carotid arteries: Secondary | ICD-10-CM | POA: Diagnosis present

## 2023-08-16 NOTE — Progress Notes (Signed)
History of Present Illness:  Patient is a 72 y.o. year old male who presents for evaluation of asymptomatic carotid stenosis.  His past duplex showed B 40-59% how ever the CTA on 09/15/21 showed 70%.  He is here today for repeat surveillance duplex.    The carotid stenosis was found during work up for CABG.  He underwent biatrial maze and four-vessel CABG 07/29/2020 with Dr. Renaldo Fiddler. This was followed by laparoscopic subtotal cholecystectomy 09/18/2020 for gangrenous cholecystitis. Then underwent a robotic VATS decortication of a loculated left pleural effusion with trapped lung with Dr. Dorris Fetch on 07/01/2021.   He is leading a more "normal" lifestyle currently.  The patient denies symptoms of TIA, amaurosis, or stroke.  No amaurosis, aphasia, or weakness on one side of the body.  He is medically managed on Crestor, Xarelto for Afib and ASA daily.       Past Medical History:  Diagnosis Date   Atrial fibrillation (HCC)    Carotid artery stenosis    Chronic diastolic (congestive) heart failure (HCC)    Coronary artery disease    Dysrhythmia    Hyperlipidemia    Hypertension    LBBB (left bundle branch block)     Past Surgical History:  Procedure Laterality Date   CARDIOVERSION N/A 09/28/2016   Procedure: CARDIOVERSION;  Surgeon: Yates Decamp, MD;  Location: [facility] ENDOSCOPY;  Service: Cardiovascular;  Laterality: N/A;   CATARACT EXTRACTION, BILATERAL     CHEST TUBE INSERTION N/A 06/24/2021   Procedure: CHEST TUBE INSERTION;  Surgeon: Martina Sinner, MD;  Location: [facility] ENDOSCOPY;  Service: Pulmonary;  Laterality: N/A;   CHOLECYSTECTOMY N/A 09/18/2020   Procedure: LAPAROSCOPIC PARTIAL CHOLECYSTECTOMY;  Surgeon: Ovidio Kin, MD;  Location: [facility] ORS;  Service: General;  Laterality: N/A;   CLIPPING OF ATRIAL APPENDAGE Left 07/29/2020   Procedure: CLIPPING OF ATRIAL APPENDAGE;  Surgeon: Linden Dolin, MD;  Location: [facility] OR;  Service: Open Heart Surgery;  Laterality: Left;   CORONARY  ARTERY BYPASS GRAFT N/A 07/29/2020   Procedure: CORONARY ARTERY BYPASS GRAFTING (CABG) TIMES FOUR USING BILATERAL INTERNAL MAMMARIES AND LEFT RADIAL ARTERY;  Surgeon: Linden Dolin, MD;  Location: [facility] OR;  Service: Open Heart Surgery;  Laterality: N/A;   ERCP N/A 09/17/2020   Procedure: ENDOSCOPIC RETROGRADE CHOLANGIOPANCREATOGRAPHY (ERCP);  Surgeon: Meryl Dare, MD;  Location: [facility] ENDOSCOPY;  Service: Endoscopy;  Laterality: N/A;   IR THORACENTESIS ASP PLEURAL SPACE W/IMG GUIDE  06/23/2021   LEFT HEART CATH AND CORONARY ANGIOGRAPHY N/A 07/25/2020   Procedure: LEFT HEART CATH AND CORONARY ANGIOGRAPHY;  Surgeon: Yates Decamp, MD;  Location: [facility] INVASIVE CV LAB;  Service: Cardiovascular;  Laterality: N/A;   MAZE N/A 07/29/2020   Procedure: MAZE;  Surgeon: Linden Dolin, MD;  Location: [facility] OR;  Service: Open Heart Surgery;  Laterality: N/A;   RADIAL ARTERY HARVEST Left 07/29/2020   Procedure: RADIAL ARTERY HARVEST;  Surgeon: Linden Dolin, MD;  Location: [facility] OR;  Service: Open Heart Surgery;  Laterality: Left;   REMOVAL OF STONES  09/17/2020   Procedure: REMOVAL OF STONES;  Surgeon: Meryl Dare, MD;  Location: [facility] ENDOSCOPY;  Service: Endoscopy;;   RIGHT HEART CATH N/A 12/07/2022   Procedure: RIGHT HEART CATH;  Surgeon: [name], MD;  Location: [facility] INVASIVE CV LAB;  Service: Cardiovascular;  Laterality: N/A;   SPHINCTEROTOMY  09/17/2020   Procedure: SPHINCTEROTOMY;  Surgeon: Meryl Dare, MD;  Location: [facility] ENDOSCOPY;  Service: Endoscopy;;   TEE WITHOUT CARDIOVERSION N/A  07/29/2020   Procedure: TRANSESOPHAGEAL ECHOCARDIOGRAM (TEE);  Surgeon: Linden Dolin, MD;  Location: [facility] OR;  Service: Open Heart Surgery;  Laterality: N/A;     Social History Social History   Tobacco Use   Smoking status: Never   Smokeless tobacco: Never  Vaping Use   Vaping status: Never Used  Substance Use Topics   Alcohol use: No   Drug use: No    Family History Family History  Problem  Relation Age of Onset   Heart disease Mother    Hyperlipidemia Mother    Heart disease Father    Hypertension Other     Allergies  No Known Allergies   Current Outpatient Medications  Medication Sig Dispense Refill   Aspirin 81 MG CAPS Take 81 mg by mouth daily. 30 capsule 6   dapagliflozin propanediol (FARXIGA) 10 MG TABS tablet Take 1 tablet (10 mg total) by mouth daily before breakfast. 90 tablet 3   rivaroxaban (XARELTO) 20 MG TABS tablet Take 1 tablet (20 mg total) by mouth daily with supper. 90 tablet 3   rosuvastatin (CRESTOR) 20 MG tablet Take 1 tablet (20 mg total) by mouth at bedtime. 90 tablet 3   sacubitril-valsartan (ENTRESTO) 49-51 MG Take 1 tablet by mouth 2 (two) times daily. 180 tablet 0   spironolactone (ALDACTONE) 25 MG tablet TAKE 1 TABLET BY MOUTH DAILY 100 tablet 2   torsemide (DEMADEX) 20 MG tablet TAKE 1 TABLET BY MOUTH EVERY DAY IN THE MORNING 90 tablet 3   No current facility-administered medications for this visit.    ROS:   General:  No weight loss, Fever, chills  HEENT: No recent headaches, no nasal bleeding, no visual changes, no sore throat  Neurologic: No dizziness, blackouts, seizures. No recent symptoms of stroke or mini- stroke. No recent episodes of slurred speech, or temporary blindness.  Cardiac: No recent episodes of chest pain/pressure, no shortness of breath at rest.  No shortness of breath with exertion.  Denies history of atrial fibrillation or irregular heartbeat  Vascular: No history of rest pain in feet.  No history of claudication.  No history of non-healing ulcer, No history of DVT   Pulmonary: No home oxygen, no productive cough, no hemoptysis,  No asthma or wheezing  Musculoskeletal:  [ ]  Arthritis, [ ]  Low back pain,  [ ]  Joint pain  Hematologic:No history of hypercoagulable state.  No history of easy bleeding.  No history of anemia  Gastrointestinal: No hematochezia or melena,  No gastroesophageal reflux, no trouble  swallowing  Urinary: [ ]  chronic Kidney disease, [ ]  on HD - [ ]  MWF or [ ]  TTHS, [ ]  Burning with urination, [ ]  Frequent urination, [ ]  Difficulty urinating;   Skin: No rashes  Psychological: No history of anxiety,  No history of depression   Physical Examination  Vitals:   08/16/23 1305  BP: 124/78  Pulse: (!) 47  Resp: 20  Temp: 97.9 F (36.6 C)  TempSrc: Temporal  SpO2: 99%  Weight: 241 lb 8 oz (109.5 kg)  Height: 6\' 1"  (1.854 m)    Body mass index is 31.86 kg/m.  General:  Alert and oriented, no acute distress HEENT: Normal Neck: No bruit or JVD Pulmonary: rhonchi  to auscultation bilaterally, productive cough Cardiac: irregularly irregular Rhythm with murmur Gastrointestinal: Soft, non-tender, non-distended, no mass, no scars Skin: No rash Extremity Pulses:  2+ radial, brachial, femoral, dorsalis pedis, posterior tibial pulses bilaterally Musculoskeletal: No deformity or edema  Neurologic: Upper and  lower extremity motor 5 grossly intact and equal and symmetric  DATA:   Right Carotid Findings:  +----------+--------+--------+--------+-------------------------+--------+           PSV cm/sEDV cm/sStenosisPlaque Description       Comments  +----------+--------+--------+--------+-------------------------+--------+  CCA Prox  71      9                                                  +----------+--------+--------+--------+-------------------------+--------+  CCA Distal46      6                                                  +----------+--------+--------+--------+-------------------------+--------+  ICA Prox  51      9               heterogenous and calcific          +----------+--------+--------+--------+-------------------------+--------+  ICA Mid   434     103     60-79%                                     +----------+--------+--------+--------+-------------------------+--------+  ICA Distal234     31                                                  +----------+--------+--------+--------+-------------------------+--------+  ECA      114     9               heterogenous                       +----------+--------+--------+--------+-------------------------+--------+   +----------+--------+-------+---------+-------------------+           PSV cm/sEDV cmsDescribe Arm Pressure (mmHG)  +----------+--------+-------+---------+-------------------+  Norville Haggard            Turbulent122                  +----------+--------+-------+---------+-------------------+   +---------+--------+--+--------+--+---------+  VertebralPSV cm/s71EDV cm/s17Antegrade  +---------+--------+--+--------+--+---------+      Left Carotid Findings:  +----------+--------+--------+--------+---------------------+--------------  ----+           PSV cm/sEDV cm/sStenosisPlaque Description   Comments             +----------+--------+--------+--------+---------------------+--------------  ----+  CCA Prox  110     11                                                        +----------+--------+--------+--------+---------------------+--------------  ----+  CCA Distal71      13                                   intimal  thickening  +----------+--------+--------+--------+---------------------+--------------  ----+  ICA Prox  246     47  heterogenous,                                                               calcific and                                                                irregular                                 +----------+--------+--------+--------+---------------------+--------------  ----+  ICA Mid   323     81      60-79%  heterogenous                              +----------+--------+--------+--------+---------------------+--------------  ----+  ICA Distal170     39                                                         +----------+--------+--------+--------+---------------------+--------------  ----+  ECA      145     10              heterogenous and                                                            smooth                                    +----------+--------+--------+--------+---------------------+--------------  ----+   +----------+--------+--------+----------------+-------------------+           PSV cm/sEDV cm/sDescribe        Arm Pressure (mmHG)  +----------+--------+--------+----------------+-------------------+  [id number]             Multiphasic, IEP329                  +----------+--------+--------+----------------+-------------------+   +---------+--------+--+--------+-+---------+  VertebralPSV cm/s62EDV cm/s9Antegrade  +---------+--------+--+--------+-+---------+      Summary:  Right Carotid: Velocities in the right ICA are consistent with an  upper-range                60-79% stenosis. Mid ICA/proximal ICA ratio=8.5.   Left Carotid: Velocities in the left ICA are consistent with a 60-79%  stenosis.               Proximal ICA/distal CCA ratio=3.46.   Vertebrals:  Bilateral vertebral arteries demonstrate antegrade flow.  Subclavians: Right subclavian artery flow was disturbed. Normal flow               hemodynamics were seen in the left subclavian artery.   ASSESSMENT/PLAN: Asymptomatic  carotid stenosis B The right ICA has a PSV of 103 with 60-79% stenosis, the left has PSV of 81 with 60-79% stenosis.  He remains asymptomatic for stroke/TIA symptoms.  I will bring him back for close surveillance.  If he has 80% or greater stenosis on duplex he will be sent for repeat CTA and follow up with DR. Hawken to plan CEA verse TCAR.    If he develops symptoms of stroke he will call 911.      Fabienne Bruns, MD [facility] [place] Office: [phone] Pager: [phone]

## 2023-09-09 ENCOUNTER — Other Ambulatory Visit: Payer: Self-pay

## 2023-09-09 DIAGNOSIS — I6523 Occlusion and stenosis of bilateral carotid arteries: Secondary | ICD-10-CM

## 2023-09-27 ENCOUNTER — Telehealth: Payer: Self-pay

## 2023-09-27 ENCOUNTER — Telehealth: Payer: Self-pay | Admitting: Cardiology

## 2023-09-27 NOTE — Telephone Encounter (Signed)
PAP: PAP application for [employer]) has been mailed to [employer] home address on file. Will fax provider portion of application to provider's office when pt's portion is received.

## 2023-09-27 NOTE — Telephone Encounter (Signed)
Pt c/o medication issue:  1. Name of Medication: Entresto   2. How are you currently taking this medication (dosage and times per day)?   3. Are you having a reaction (difficulty breathing--STAT)?   4. What is your medication issue?   Patient would like to discuss re-enrolling in [employer] patient assistance.

## 2023-09-29 NOTE — Telephone Encounter (Signed)
Patient is following up. He would like a call back with updates regarding this matter. He states he would prefer to speak with someone on the phone because he has questions. He states he does not have a copy of his tax returns and he would like to know if a copy can be retrieved from the office. Please advise.

## 2023-09-30 NOTE — Telephone Encounter (Addendum)
We do not have tax documentation anywhere on file for this patient, so he will need to obtain that for [employer].   His PAP was not done at this office location before and we have nothing on file from [facility].   I sent him a letter detailing what he needed to do for PAP along with my contact information.

## 2023-09-30 NOTE — Telephone Encounter (Signed)
**Note De-Identified [name] Rice Obfuscation** Please see phone note from 07/21/22.

## 2023-10-03 NOTE — Telephone Encounter (Signed)
Pt trying to follow up and speak with Kayla. Please advise

## 2023-10-04 ENCOUNTER — Other Ambulatory Visit: Payer: Self-pay

## 2023-10-04 ENCOUNTER — Other Ambulatory Visit (HOSPITAL_COMMUNITY): Payer: Self-pay

## 2023-10-04 DIAGNOSIS — I5032 Chronic diastolic (congestive) heart failure: Secondary | ICD-10-CM

## 2023-10-04 DIAGNOSIS — I5042 Chronic combined systolic (congestive) and diastolic (congestive) heart failure: Secondary | ICD-10-CM

## 2023-10-04 MED ORDER — DAPAGLIFLOZIN PROPANEDIOL 10 MG PO TABS: 10.0000 mg | ORAL_TABLET | Freq: Every day | ORAL | 3 refills | Status: DC

## 2023-10-04 MED ORDER — ENTRESTO 49-51 MG PO TABS
1.0000 | ORAL_TABLET | Freq: Two times a day (BID) | ORAL | 3 refills | Status: DC
  Filled 2023-10-04: qty 180, 90d supply, fill #0
  Filled 2023-12-29 (×2): qty 180, 90d supply, fill #1
  Filled 2024-03-01 – 2024-03-06 (×2): qty 180, 90d supply, fill #2
  Filled 2024-06-04: qty 180, 90d supply, fill #3

## 2023-10-04 NOTE — Progress Notes (Signed)
RX sent to [facility] pharmacy

## 2023-10-04 NOTE — Telephone Encounter (Signed)
Patient Advocate Encounter  **Spoke with Thayer Ohm, [facility] who is fine doing a healthwell grant for this patient.   **Spoke with Fayrene Fearing, he is aware we are replacing [employer] with [employer] and that this will cover his Sherryll Burger and Marcelline Deist going forward. He will be getting his medication delivered from [facility] going forward.   **Pt has a few weeks left and is advised to call [facility] at [phone] to request his shipment when he is low on medication.    The patient was approved for a [employer] grant that will help cover the cost of ENTRESTO AND FARXIGA Total amount awarded, $10,000.  Effective: 09/04/23 - 09/02/24   [id number] MWN:[id number] [id number]   Pharmacy provided with approval and processing information. Patient informed via TELEPHONE  Haze Rushing, CPhT  Pharmacy Patient Advocate Specialist  Direct Number: [phone] Fax: [phone]

## 2023-10-04 NOTE — Telephone Encounter (Signed)
Please send in prescription for Sherryll Burger and Farxiga to [facility] at [facility] [place],  [place]  [address]).   *Note: I already put the grant info on file in [place] with billing instructions and set patient's delivery preference to be mail order.

## 2023-10-10 ENCOUNTER — Other Ambulatory Visit: Payer: Self-pay

## 2023-10-10 MED ORDER — DAPAGLIFLOZIN PROPANEDIOL 10 MG PO TABS
10.0000 mg | ORAL_TABLET | Freq: Every day | ORAL | 3 refills | Status: DC
  Filled 2023-10-10: qty 90, 90d supply, fill #0
  Filled 2024-03-19: qty 90, 90d supply, fill #1
  Filled 2024-06-15 (×2): qty 90, 90d supply, fill #2
  Filled 2024-09-10: qty 90, 90d supply, fill #3

## 2023-10-10 NOTE — Telephone Encounter (Signed)
Order for Farxiga sent to the [facility] at [place] at the request of Haze Rushing, [employer]. The [facility] prescription has already been sent to the [facility] at [facility] Please see her attached note/ message below:   Please send in prescription for Sherryll Burger and Farxiga to [facility] at [facility] [place],  [place]  [address]).    *Note: I already put the grant info on file in [place] with billing instructions and set patient's delivery preference to be mail order.

## 2023-10-10 NOTE — Telephone Encounter (Signed)
Spoke with pt and let him know that prescription for Marcelline Deist has been sent to [facility]. Pt verbalized understanding and had no further questions at this time.

## 2023-10-11 ENCOUNTER — Other Ambulatory Visit: Payer: Self-pay | Admitting: Cardiology

## 2023-10-11 ENCOUNTER — Other Ambulatory Visit (HOSPITAL_COMMUNITY): Payer: Self-pay

## 2023-10-11 DIAGNOSIS — I119 Hypertensive heart disease without heart failure: Secondary | ICD-10-CM

## 2023-10-12 ENCOUNTER — Other Ambulatory Visit: Payer: Self-pay

## 2023-10-12 ENCOUNTER — Other Ambulatory Visit (HOSPITAL_COMMUNITY): Payer: Self-pay

## 2023-10-12 MED ORDER — RIVAROXABAN 20 MG PO TABS
20.0000 mg | ORAL_TABLET | Freq: Every day | ORAL | 1 refills | Status: DC
  Filled 2023-10-12: qty 30, 30d supply, fill #0
  Filled 2023-11-11: qty 30, 30d supply, fill #1
  Filled 2023-11-14: qty 90, 90d supply, fill #1

## 2023-10-12 NOTE — Telephone Encounter (Signed)
Prescription refill request for Xarelto received.  Indication: AF Last office visit: 07/25/23  Emelda Brothers MD Weight: 104.3kg Age: 72 Scr: 1.21 on 12/23/22  Epic CrCl: 83.80  Based on above findings Xarelto 20mg  daily is the appropriate dose.  Refill approved.

## 2023-11-11 ENCOUNTER — Other Ambulatory Visit (HOSPITAL_COMMUNITY): Payer: Self-pay

## 2023-11-14 ENCOUNTER — Other Ambulatory Visit (HOSPITAL_COMMUNITY): Payer: Self-pay

## 2023-11-15 ENCOUNTER — Ambulatory Visit (HOSPITAL_COMMUNITY)
Admission: RE | Admit: 2023-11-15 | Discharge: 2023-11-15 | Disposition: A | Discharge status: Home / Self Care | Payer: Medicare Other | Source: Ambulatory Visit | Attending: Vascular Surgery | Admitting: Vascular Surgery

## 2023-11-15 ENCOUNTER — Ambulatory Visit: Payer: Medicare Other | Admitting: Physician Assistant

## 2023-11-15 VITALS — BP 136/80 | HR 46 | Temp 98.1°F | Resp 20 | Ht 73.0 in | Wt 232.2 lb

## 2023-11-15 DIAGNOSIS — I6523 Occlusion and stenosis of bilateral carotid arteries: Secondary | ICD-10-CM | POA: Insufficient documentation

## 2023-11-15 NOTE — Progress Notes (Signed)
 Office Note   History of Present Illness   [name] Rice is a 73 y.o. (11/18/1950) male who presents for surveillance of carotid artery stenosis.  His carotid stenosis was originally found during workup for a CABG.  Previous CTA in 2022 demonstrated 70% stenosis of bilateral ICAs.  Previous duplex demonstrated 60 to 79% stenosis bilaterally.  The patient returns today for follow up.  He denies any recent medical history changes.  He denies any strokelike symptoms such as weakness, numbness, sudden visual changes, slurred speech, or facial droop.  He takes a daily aspirin  and statin.  He is also on Xarelto .  Current Outpatient Medications  Medication Sig Dispense Refill   Aspirin  81 MG CAPS Take 81 mg by mouth daily. 30 capsule 6   dapagliflozin  propanediol (FARXIGA ) 10 MG TABS tablet Take 1 tablet (10 mg total) by mouth daily before breakfast. 90 tablet 3   rivaroxaban  (XARELTO ) 20 MG TABS tablet Take 1 tablet (20 mg total) by mouth daily with supper. 90 tablet 1   rosuvastatin  (CRESTOR ) 20 MG tablet Take 1 tablet (20 mg total) by mouth at bedtime. 90 tablet 3   sacubitril -valsartan  (ENTRESTO ) 49-51 MG Take 1 tablet by mouth 2 (two) times daily. 180 tablet 3   spironolactone  (ALDACTONE ) 25 MG tablet TAKE 1 TABLET BY MOUTH DAILY 100 tablet 2   torsemide  (DEMADEX ) 20 MG tablet TAKE 1 TABLET BY MOUTH EVERY DAY IN THE MORNING 90 tablet 3   No current facility-administered medications for this visit.    REVIEW OF SYSTEMS (negative unless checked):   Cardiac:  []  Chest pain or chest pressure? []  Shortness of breath upon activity? []  Shortness of breath when lying flat? []  Irregular heart rhythm?  Vascular:  []  Pain in calf, thigh, or hip brought on by walking? []  Pain in feet at night that wakes you up from your sleep? []  Blood clot in your veins? []  Leg swelling?  Pulmonary:  []  Oxygen at home? []  Productive cough? []  Wheezing?  Neurologic:  []  Sudden weakness in arms or  legs? []  Sudden numbness in arms or legs? []  Sudden onset of difficult speaking or slurred speech? []  Temporary loss of vision in one eye? []  Problems with dizziness?  Gastrointestinal:  []  Blood in stool? []  Vomited blood?  Genitourinary:  []  Burning when urinating? []  Blood in urine?  Psychiatric:  []  Major depression  Hematologic:  []  Bleeding problems? []  Problems with blood clotting?  Dermatologic:  []  Rashes or ulcers?  Constitutional:  []  Fever or chills?  Ear/Nose/Throat:  []  Change in hearing? []  Nose bleeds? []  Sore throat?  Musculoskeletal:  []  Back pain? []  Joint pain? []  Muscle pain?   Physical Examination   Vitals:   11/15/23 1302 11/15/23 1305  BP: (!) 157/82 136/80  Pulse: (!) 46   Resp: 20   Temp: 98.1 F (36.7 C)   TempSrc: Temporal   SpO2: 98%   Weight: 232 lb 3.2 oz (105.3 kg)   Height: 6' 1 (1.854 m)    Body mass index is 30.64 kg/m.  General:  WDWN in NAD; vital signs documented above Gait: Not observed HENT: WNL, normocephalic Pulmonary: normal non-labored breathing  Cardiac: irregularly irregular Abdomen: soft, NT, no masses Skin: without rashes Vascular Exam/Pulses: palpable radial pulses bilaterally Extremities: without ischemic changes, without gangrene , without cellulitis; without open wounds;  Musculoskeletal: no muscle wasting or atrophy  Neurologic: A&O X 3;  No focal weakness or paresthesias are detected Psychiatric:  The  pt has Normal affect.  Non-Invasive Vascular Imaging   Bilateral Carotid Duplex (11/15/2023):  R ICA stenosis:  60-79% R VA:  patent and antegrade L ICA stenosis:  40-59% L VA:  patent and antegrade   Medical Decision Making   [name] Rice is a 73 y.o. male who presents for surveillance of carotid artery stenosis  Based on the patient's vascular studies, his carotid stenosis is unchanged bilaterally.  Duplex on the left demonstrates only 40 to 59% today, however he has confirmed 70%  stenosis on this side by CTA.  He has 60 to 79% stenosis on the right. He denies any strokelike symptoms such as slurred speech, facial droop, sudden visual changes, or sudden weakness/numbness.  He has no neuro deficits on exam.  He has palpable and equal radial pulses bilaterally. He is very pleased to hear that he does not need carotid surgery at this time given all of the surgeries he needed in 2021 and 2022.  He can follow-up with our office in 6 months with repeat carotid duplex   Ahmed Holster PA-C [facility] [place] Office: [phone]  Clinic MD: [facility]

## 2023-11-29 ENCOUNTER — Other Ambulatory Visit: Payer: Self-pay

## 2023-11-29 DIAGNOSIS — I6523 Occlusion and stenosis of bilateral carotid arteries: Secondary | ICD-10-CM

## 2023-12-29 ENCOUNTER — Other Ambulatory Visit (HOSPITAL_COMMUNITY): Payer: Self-pay

## 2024-01-03 ENCOUNTER — Other Ambulatory Visit: Payer: Self-pay | Admitting: Cardiology

## 2024-01-03 DIAGNOSIS — I5033 Acute on chronic diastolic (congestive) heart failure: Secondary | ICD-10-CM

## 2024-01-26 ENCOUNTER — Ambulatory Visit: Payer: Medicare Other | Attending: Cardiology | Admitting: Cardiology

## 2024-01-26 ENCOUNTER — Encounter: Payer: Self-pay | Admitting: Cardiology

## 2024-01-26 ENCOUNTER — Other Ambulatory Visit: Payer: Self-pay

## 2024-01-26 VITALS — BP 110/58 | HR 71 | Resp 16 | Ht 73.0 in | Wt 233.0 lb

## 2024-01-26 DIAGNOSIS — Z951 Presence of aortocoronary bypass graft: Secondary | ICD-10-CM | POA: Diagnosis not present

## 2024-01-26 DIAGNOSIS — Z9889 Other specified postprocedural states: Secondary | ICD-10-CM

## 2024-01-26 DIAGNOSIS — Z7901 Long term (current) use of anticoagulants: Secondary | ICD-10-CM

## 2024-01-26 DIAGNOSIS — I4821 Permanent atrial fibrillation: Secondary | ICD-10-CM

## 2024-01-26 DIAGNOSIS — Z8679 Personal history of other diseases of the circulatory system: Secondary | ICD-10-CM

## 2024-01-26 DIAGNOSIS — I2510 Atherosclerotic heart disease of native coronary artery without angina pectoris: Secondary | ICD-10-CM

## 2024-01-26 DIAGNOSIS — I119 Hypertensive heart disease without heart failure: Secondary | ICD-10-CM

## 2024-01-26 DIAGNOSIS — I5032 Chronic diastolic (congestive) heart failure: Secondary | ICD-10-CM | POA: Diagnosis not present

## 2024-01-26 DIAGNOSIS — E7800 Pure hypercholesterolemia, unspecified: Secondary | ICD-10-CM

## 2024-01-26 DIAGNOSIS — I447 Left bundle-branch block, unspecified: Secondary | ICD-10-CM

## 2024-01-26 DIAGNOSIS — I5042 Chronic combined systolic (congestive) and diastolic (congestive) heart failure: Secondary | ICD-10-CM

## 2024-01-26 DIAGNOSIS — I6523 Occlusion and stenosis of bilateral carotid arteries: Secondary | ICD-10-CM

## 2024-01-26 MED ORDER — TORSEMIDE 20 MG PO TABS: 20.0000 mg | ORAL_TABLET | Freq: Every day | ORAL | 3 refills | Status: AC | PRN

## 2024-01-26 NOTE — Patient Instructions (Signed)
 Medication Instructions:  Your physician recommends that you continue on your current medications as directed. Please refer to the Current Medication list given to you today.  *If you need a refill on your cardiac medications before your next appointment, please call your pharmacy*  Lab Work: Your physician recommends that you have lab work when fasting for lipid panel, BMET, and H & H If you have labs (blood work) drawn today and your tests are completely normal, you will receive your results only by: MyChart Message (if you have MyChart) OR A paper copy in the mail If you have any lab test that is abnormal or we need to change your treatment, we will call you to review the results.  Testing/Procedures: None ordered today.  Follow-Up: At [facility], you and your health needs are our priority.  As part of our continuing mission to provide you with exceptional heart care, we have created designated Provider Care Teams.  These Care Teams include your primary Cardiologist (physician) and Advanced Practice Providers (APPs -  Physician Assistants and Nurse Practitioners) who all work together to provide you with the care you need, when you need it.  We recommend signing up for the patient portal called "MyChart".  Sign up information is provided on this After Visit Summary.  MyChart is used to connect with patients for Virtual Visits (Telemedicine).  Patients are able to view lab/test results, encounter notes, upcoming appointments, etc.  Non-urgent messages can be sent to your provider as well.   To learn more about what you can do with MyChart, go to [email or web address].    Your next appointment:   1 year(s)  Provider:   Tessa Lerner, DO     Other Instructions       1st Floor: - Lobby - Registration  - Pharmacy  - Lab - Cafe  2nd Floor: - PV Lab - Diagnostic Testing (echo, CT, nuclear med)  3rd Floor: - Vacant  4th Floor: - TCTS (cardiothoracic surgery) -  AFib Clinic - Structural Heart Clinic - Vascular Surgery  - Vascular Ultrasound  5th Floor: - HeartCare Cardiology (general and EP) - Clinical Pharmacy for coumadin, hypertension, lipid, weight-loss medications, and med management appointments    Valet parking services will be available as well.

## 2024-01-26 NOTE — Progress Notes (Signed)
 Cardiology Office Note:  .   Date:  01/26/2024  ID:  [name] Rice, DOB 1951-09-24, MRN [id number] PCP:  Pcp, No  Former Cardiology Providers: Dr. Rudean Hitt, APRN, FNP-C  [facility] Providers Cardiologist:  Tessa Lerner, DO , [facility] (established care 01/14/2020) Electrophysiologist:  None  Click to update primary MD,subspecialty MD or APP then REFRESH:1}    Chief Complaint  Patient presents with   Chronic heart failure with preserved ejection fraction   Follow-up    History of Present Illness: .   [name] Rice is a 73 y.o. 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   Over the last 6 months he is doing well from a cardiovascular standpoint.  He denies anginal chest pain or heart failure symptoms.  He enjoys being active in the community and singing for his church.  His weight remains stable.  He uses torsemide on appearing basis.  With targeting a goal of 230 pounds and if his weight goes above 235 he does use as needed torsemide.  No hospitalizations or urgent care visits for heart failure since last office encounter.  Overall feels great.   Review of Systems: .   Review of Systems  Cardiovascular:  Positive for dyspnea on exertion (chronic and stable). Negative for chest pain, claudication, irregular heartbeat, leg swelling, near-syncope, orthopnea, palpitations, paroxysmal nocturnal dyspnea and syncope.   Hematologic/Lymphatic: Negative for bleeding problem.    Studies Reviewed:   EKG: EKG Interpretation Date/Time:  Thursday January 26 2024 08:10:37 EDT Ventricular Rate:  65 PR Interval:    QRS Duration:  154 QT Interval:  448 QTC Calculation: 465 R Axis:   -47  Text Interpretation: Atrial fibrillation CONTROLLED VENTRICULAR RESPONSE Indeterminate axis Non-specific intra-ventricular conduction block Minimal voltage criteria for LVH, may be normal variant ( Cornell product ) Lateral infarct , age undetermined When compared with ECG of 05-Dec-2022 17:32, No significant change since last tracing Confirmed by Tessa Lerner [phone]) on 01/26/2024 8:15:13 AM  Coronary artery bypass grafting: 07/29/2020 (by Dr. Vickey Sages at [facility]):  Left Internal Mammary Artery to Distal Left Anterior Descending Coronary Artery; pedicled RIMA Graft to Posterior Descending Coronary Artery; left radial artery  Graft to Obtuse Marginal Branch of Left Circumflex Coronary Artery and ramus intermedius as a sequenced graft. Bi-atrial Maze procedure and left atrial appendage clipping   Echocardiogram: 12/31/2019: 45-50% with regional wall motion abnormalities.  Please refer to the report for additional details.   09/27/2020: LVEF 45-50% with regional wall motion abnormalities, biatrial enlargement, see report for additional details   06/23/2022: 60-65%, moderate LVH, biatrial dilatation, right ventricular cavity dilated function normal, moderate TR, RVSP 36 mmHg, IVC dilated   Limited echo 12/07/2022: LVEF 60 to 65%, no regional wall motion abnormalities, RV systolic function mildly reduced size normal, mild PASP 37 mmHg, moderate MAC estimated RAP 3 mmHg   Heart Catheterization: 07/25/20:  RCA: Proximal RCA 80% stenosis, large vessel with mild disease in PL and PDA branches.  A secondary PL branch is subtotally occluded. LM: Distal 30-40% stenosis. LAD: LAD diffusely diseased, proximal diffuse 80% followed by tandem  90% stenosis.  Large D1 with moderate diffuse disease and proximal and mid tandem 70 and 80% stenosis.  Has secondary branches and is tortuous. Cx  and RI: Co-dominant Cx with Ostial circumflex 99% involving a moderate sized ramus with ostial 80 to 90% and proximal 90% stenosis. LV: Normal LVEDP.  No pressure gradient across the aortic valve. Patent LIMA and RIMA and RIMA has ostial 20-30% stenosis. Subclavian arteries widely patent.  Right radial diffusely disease by [place]. 65mL contrast used.    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 59bpm).  Overall HR 25-197 bpm.  Avg HR 59 bpm. 1519 pauses that were 3 secs or longer.  The longest pause 9.5 sec at 12:36 AM (07/04/2020), followed by 9.4 sec pause at 4:30 AM (07/11/2020), third longest pause 8.9 sec (07/08/2020).  These are auto-triggered events. 20 episodes of NSVT reported.  Longest episode 20 beats in duration for 10.5 seconds at an average rate 116 bpm.  The fastest episode was 6 beats in duration at average rate of 170 bpm.  Total ventricular ectopic burden <1%. Patient activated events: 0.  RADIOLOGY: NA  Risk Assessment/Calculations:   NA   Labs:       Latest Ref Rng & Units 12/08/2022    1:26 AM 12/07/2022    4:22 PM 12/07/2022    1:35 AM  CBC  WBC 4.0 - 10.5 K/uL 5.2   5.2   Hemoglobin 13.0 - 17.0 g/dL 9.5  16.1    09.6  8.6   Hematocrit 39.0 - 52.0 % 32.9  32.0    32.0  32.1   Platelets 150 - 400 K/uL 207   247        Latest Ref Rng & Units 12/23/2022   10:21 AM 12/16/2022    9:44 AM 12/10/2022    1:44 AM  BMP  Glucose 70 - 99 mg/dL 045  72  98   BUN 8 - 27 mg/dL 17  15  21    Creatinine 0.76 - 1.27 mg/dL 4.09  8.11  9.14    BUN/Creat Ratio 10 - 24 14  13     Sodium 134 - 144 mmol/L 142  141  135   Potassium 3.5 - 5.2 mmol/L 4.9  4.8  3.6   Chloride 96 - 106 mmol/L 103  104  99   CO2 20 - 29 mmol/L 20  21  29    Calcium 8.6 - 10.2 mg/dL 9.0  9.1  8.5       Latest Ref Rng & Units 12/23/2022   10:21 AM 12/16/2022    9:44 AM 12/10/2022    1:44 AM  CMP  Glucose 70 - 99 mg/dL 782  72  98   BUN 8 - 27 mg/dL 17  15  21    Creatinine 0.76 - 1.27 mg/dL 9.56  2.13  0.86   Sodium 134 - 144 mmol/L 142  141  135   Potassium 3.5 - 5.2 mmol/L 4.9  4.8  3.6   Chloride 96 - 106 mmol/L 103  104  99   CO2 20 - 29 mmol/L 20  21  29    Calcium 8.6 - 10.2 mg/dL 9.0  9.1  8.5     Lab  Results  Component Value Date   CHOL 82 12/07/2022   HDL 32 (L) 12/07/2022   LDLCALC 39 12/07/2022   LDLDIRECT 38 12/07/2022   TRIG 56 12/07/2022   CHOLHDL 2.6 12/07/2022   No results for input(s): "LIPOA" in the last 8760 hours. No components found for: "NTPROBNP" No results for input(s): "PROBNP" in the last 8760 hours. No results for input(s): "TSH" in the last 8760 hours.  Physical Exam:    Today's Vitals   01/26/24 0807  BP: (!) 110/58  Pulse: 71  Resp: 16  SpO2: 98%  Weight: 233 lb (105.7 kg)  Height: 6\' 1"  (1.854 m)   Body mass index is 30.74 kg/m. Wt Readings from Last 3 Encounters:  01/26/24 233 lb (105.7 kg)  11/15/23 232 lb 3.2 oz (105.3 kg)  08/16/23 241 lb 8 oz (109.5 kg)    Physical Exam  Constitutional: No distress.  Appears older than stated age, hemodynamically stable.   HENT:  Poor oral dentition.  Neck: No JVD present.  Cardiovascular: Normal rate, S1 normal, S2 normal, intact distal pulses and normal pulses. An irregularly irregular rhythm present. Exam reveals no gallop, no S3 and no S4.  Murmur heard. Holosystolic murmur is present with a grade of 3/6 at the lower left sternal border. Pulses:      Carotid pulses are  on the right side with bruit and  on the left side with bruit. Pulmonary/Chest:  Effort normal and breath sounds normal. No stridor. He has no wheezes. He has no rales.  Sternotomy site well-healed  Abdominal: Soft. Bowel sounds are normal. He exhibits no distension. There is no abdominal tenderness.  Musculoskeletal:        General: No edema.     Cervical back: Neck supple.  Neurological: He is alert and oriented to person, place, and time. He has intact cranial nerves (2-12).  Skin: Skin is warm and moist.     Impression & Recommendation(s):  Impression:   ICD-10-CM   1. Chronic heart failure with preserved ejection fraction (HFpEF) (HCC)  I50.32 EKG 12-Lead    torsemide (DEMADEX) 20 MG tablet    2. Atherosclerosis of native coronary artery of native heart without angina pectoris  I25.10     3. Hx of CABG  Z95.1     4. Left bundle branch block  I44.7     5. Permanent atrial fibrillation (HCC)  I48.21     6. Long term current use of anticoagulant  Z79.01 Hematocrit    Hemoglobin    Basic metabolic panel    7. S/P Maze operation for atrial fibrillation  Z98.890    Z86.79     8. S/P left atrial appendage ligation  Z98.890     9. Hypertensive heart disease without heart failure  I11.9     10. Pure hypercholesterolemia  E78.00 Lipid panel    11. Bilateral carotid artery stenosis  I65.23     12. Chronic combined systolic and diastolic heart failure (HCC)  W29.56        Recommendation(s):  Chronic heart failure with preserved ejection fraction (HFpEF) (HCC) Stage C, NYHA class II Last hospitalization January/February 2024. Discharge weight 229 pounds currently targeting a goal outpatient weight of 230 pounds. When he reaches closer to 235 pounds patient uses torsemide on as needed basis until he is back down to his goal weight.. Continue Entresto 49/51 mg p.o. twice daily. Continue spironolactone 25 mg p.o. daily. Continue torsemide 20 mg as needed daily for weight gain  as discussed above. Continue Farxiga 10 mg p.o. daily  Atherosclerosis of  native coronary artery of native heart without angina pectoris Hx of CABG Left bundle branch block Denies anginal chest pain. Discontinue aspirin and he is on anticoagulation to minimize risk of bleeding. Continue statin therapy. Reemphasized the importance of secondary prevention  Permanent atrial fibrillation (HCC) Long term current use of anticoagulant S/P Maze operation for atrial fibrillation S/P left atrial appendage ligation Rate control: N/A as he is already bradycardic at baseline. Rhythm control: N/A Thromboembolic prophylaxis: Xarelto Has been on amiodarone in the past. Does not endorse evidence of bleeding. Will repeat H&H, BMP prior to the next office visit  Pure hypercholesterolemia No recent lipid profile available for review. As of January 2024 as per [facility] database LDL 39 mg/dL. Check fasting lipid profile. Continue Crestor 20 mg p.o. nightly  Bilateral carotid artery stenosis Follows with vascular and vein last progress note reviewed  Reemphasized importance of establishing care with PCP to make sure she gets an annual well visit as well as age-appropriate screening.  This has been mentioned to him multiple times in the past.  Will follow-up on annual basis sooner if needed  Orders Placed:  Orders Placed This Encounter  Procedures   Lipid panel    Standing Status:   Future    Number of Occurrences:   1    Expected Date:   01/26/2024    Expiration Date:   01/25/2025   Hematocrit    Standing Status:   Future    Number of Occurrences:   1    Expected Date:   01/26/2024    Expiration Date:   01/25/2025   Hemoglobin    Standing Status:   Future    Number of Occurrences:   1    Expected Date:   01/26/2024    Expiration Date:   01/25/2025   Basic metabolic panel    Standing Status:   Future    Number of Occurrences:   1    Expected Date:   01/26/2024    Expiration Date:   01/25/2025   EKG 12-Lead     Final Medication List:    Meds ordered this encounter   Medications   torsemide (DEMADEX) 20 MG tablet    Sig: Take 1 tablet (20 mg total) by mouth daily as needed (weight gain or fluid retention).    Dispense:  90 tablet    Refill:  3    Medications Discontinued During This Encounter  Medication Reason   torsemide (DEMADEX) 20 MG tablet Reorder     Current Outpatient Medications:    Aspirin 81 MG CAPS, Take 81 mg by mouth daily., Disp: 30 capsule, Rfl: 6   dapagliflozin propanediol (FARXIGA) 10 MG TABS tablet, Take 1 tablet (10 mg total) by mouth daily before breakfast., Disp: 90 tablet, Rfl: 3   rivaroxaban (XARELTO) 20 MG TABS tablet, Take 1 tablet (20 mg total) by mouth daily with supper., Disp: 90 tablet, Rfl: 1   rosuvastatin (CRESTOR) 20 MG tablet, Take 1 tablet (20 mg total) by mouth at bedtime., Disp: 90 tablet, Rfl: 3   sacubitril-valsartan (ENTRESTO) 49-51 MG, Take 1 tablet by mouth 2 (two) times daily., Disp: 180 tablet, Rfl: 3   spironolactone (ALDACTONE) 25 MG tablet, TAKE 1 TABLET BY MOUTH DAILY, Disp: 100 tablet, Rfl: 2   torsemide (DEMADEX) 20 MG tablet, Take 1 tablet (20 mg total) by mouth daily as needed (weight gain or fluid retention)., Disp: 90 tablet,  Rfl: 3  Consent:   NA  Disposition:   1 year follow-up  His questions and concerns were addressed to his satisfaction. He voices understanding of the recommendations provided during this encounter.    Signed, Tessa Lerner, DO, [facility]  [facility]  [address] [place], [place] [address] 01/26/2024 11:06 AM

## 2024-01-31 LAB — HEMATOCRIT

## 2024-02-01 ENCOUNTER — Other Ambulatory Visit: Payer: Self-pay | Admitting: Cardiology

## 2024-02-01 DIAGNOSIS — E7800 Pure hypercholesterolemia, unspecified: Secondary | ICD-10-CM

## 2024-02-01 DIAGNOSIS — Z951 Presence of aortocoronary bypass graft: Secondary | ICD-10-CM

## 2024-02-01 DIAGNOSIS — I2510 Atherosclerotic heart disease of native coronary artery without angina pectoris: Secondary | ICD-10-CM

## 2024-02-01 DIAGNOSIS — I119 Hypertensive heart disease without heart failure: Secondary | ICD-10-CM

## 2024-02-01 LAB — LIPID PANEL
Chol/HDL Ratio: 1.9 ratio (ref 0.0–5.0)
Cholesterol, Total: 114 mg/dL (ref 100–199)
HDL: 59 mg/dL (ref >39)
LDL Chol Calc (NIH): 41 mg/dL (ref 0–99)
Triglycerides: 68 mg/dL (ref 0–149)
VLDL Cholesterol Cal: 14 mg/dL (ref 5–40)

## 2024-02-01 LAB — BASIC METABOLIC PANEL
BUN/Creatinine Ratio: 16 (ref 10–24)
BUN: 22 mg/dL (ref 8–27)
CO2: 21 mmol/L (ref 20–29)
Calcium: 9.2 mg/dL (ref 8.6–10.2)
Chloride: 104 mmol/L (ref 96–106)
Creatinine, Ser: 1.38 mg/dL — ABNORMAL HIGH (ref 0.76–1.27)
Glucose: 95 mg/dL (ref 70–99)
Potassium: 4.9 mmol/L (ref 3.5–5.2)
Sodium: 138 mmol/L (ref 134–144)
eGFR: 55 mL/min/{1.73_m2} — ABNORMAL LOW (ref >59)

## 2024-02-01 LAB — HEMATOCRIT: Hematocrit: 35.3 % — ABNORMAL LOW (ref 37.5–51.0)

## 2024-02-01 LAB — HEMOGLOBIN: Hemoglobin: 10 g/dL — ABNORMAL LOW (ref 13.0–17.7)

## 2024-02-01 NOTE — Telephone Encounter (Signed)
 Pt last saw Dr Odis Hollingshead 01/26/24, last labs 01/31/24 Creat 1.38, age 73, weight 105.7kg, CrCl 74.47, based on CrCl pt is on appropriate dosage of Xarelto 20mg  every day for afib.  Will refill rx.

## 2024-03-01 ENCOUNTER — Other Ambulatory Visit (HOSPITAL_COMMUNITY): Payer: Self-pay

## 2024-03-19 ENCOUNTER — Other Ambulatory Visit (HOSPITAL_COMMUNITY): Payer: Self-pay

## 2024-05-29 ENCOUNTER — Ambulatory Visit (HOSPITAL_COMMUNITY)
Admission: RE | Admit: 2024-05-29 | Discharge: 2024-05-29 | Disposition: A | Discharge status: Home / Self Care | Payer: Medicare Other | Source: Ambulatory Visit | Attending: Vascular Surgery | Admitting: Vascular Surgery

## 2024-05-29 ENCOUNTER — Ambulatory Visit: Payer: Medicare Other | Attending: Vascular Surgery | Admitting: Physician Assistant

## 2024-05-29 VITALS — BP 146/88 | HR 62 | Temp 98.3°F | Ht 73.0 in | Wt 237.0 lb

## 2024-05-29 DIAGNOSIS — I6523 Occlusion and stenosis of bilateral carotid arteries: Secondary | ICD-10-CM | POA: Diagnosis present

## 2024-05-29 NOTE — Progress Notes (Signed)
 Office Note     CC:  follow up Requesting Provider:  No ref. provider found  HPI: [name] LOISEAU is a 73 y.o. (26-Aug-1951) male who presents for follow up of carotid artery stenosis. We have been following his bilateral ICA stenosis since 2022. Bilateral ICA's on CTA/ Duplex have been in the 60-79% range.  Today he says he has been feeling the best he has in a while. He denies any visual changes, slurred speech, facial drooping, unilateral upper or lower extremity weakness or numbness. He is medically managed on Aspirin , Statin and Xarelto .   Past Medical History:  Diagnosis Date   Atrial fibrillation (HCC)    Carotid artery stenosis    Chronic diastolic (congestive) heart failure (HCC)    Coronary artery disease    Dysrhythmia    Hyperlipidemia    Hypertension    LBBB (left bundle branch block)     Past Surgical History:  Procedure Laterality Date   CARDIOVERSION N/A 09/28/2016   Procedure: CARDIOVERSION;  Surgeon: Gordy Bergamo, MD;  Location: [facility] ENDOSCOPY;  Service: Cardiovascular;  Laterality: N/A;   CATARACT EXTRACTION, BILATERAL     CHEST TUBE INSERTION N/A 06/24/2021   Procedure: CHEST TUBE INSERTION;  Surgeon: Kara Dorn NOVAK, MD;  Location: [facility] ENDOSCOPY;  Service: Pulmonary;  Laterality: N/A;   CHOLECYSTECTOMY N/A 09/18/2020   Procedure: LAPAROSCOPIC PARTIAL CHOLECYSTECTOMY;  Surgeon: Ethyl Lenis, MD;  Location: [facility] ORS;  Service: General;  Laterality: N/A;   CLIPPING OF ATRIAL APPENDAGE Left 07/29/2020   Procedure: CLIPPING OF ATRIAL APPENDAGE;  Surgeon: German Bartlett PEDLAR, MD;  Location: [facility] OR;  Service: Open Heart Surgery;  Laterality: Left;   CORONARY ARTERY BYPASS GRAFT N/A 07/29/2020   Procedure: CORONARY ARTERY BYPASS GRAFTING (CABG) TIMES FOUR USING BILATERAL INTERNAL MAMMARIES AND LEFT RADIAL ARTERY;  Surgeon: German Bartlett PEDLAR, MD;  Location: [facility] OR;  Service: Open Heart Surgery;  Laterality: N/A;   ERCP N/A 09/17/2020   Procedure: ENDOSCOPIC RETROGRADE  CHOLANGIOPANCREATOGRAPHY (ERCP);  Surgeon: Aneita Gwendlyn DASEN, MD;  Location: [facility];  Service: Endoscopy;  Laterality: N/A;   IR THORACENTESIS ASP PLEURAL SPACE W/IMG GUIDE  06/23/2021   LEFT HEART CATH AND CORONARY ANGIOGRAPHY N/A 07/25/2020   Procedure: LEFT HEART CATH AND CORONARY ANGIOGRAPHY;  Surgeon: Bergamo Gordy, MD;  Location: [facility] INVASIVE CV LAB;  Service: Cardiovascular;  Laterality: N/A;   MAZE N/A 07/29/2020   Procedure: MAZE;  Surgeon: German Bartlett PEDLAR, MD;  Location: [facility] OR;  Service: Open Heart Surgery;  Laterality: N/A;   RADIAL ARTERY HARVEST Left 07/29/2020   Procedure: RADIAL ARTERY HARVEST;  Surgeon: German Bartlett PEDLAR, MD;  Location: [facility] OR;  Service: Open Heart Surgery;  Laterality: Left;   REMOVAL OF STONES  09/17/2020   Procedure: REMOVAL OF STONES;  Surgeon: Aneita Gwendlyn DASEN, MD;  Location: [facility] ENDOSCOPY;  Service: Endoscopy;;   RIGHT HEART CATH N/A 12/07/2022   Procedure: RIGHT HEART CATH;  Surgeon: Elmira Newman PARAS, MD;  Location: [facility] INVASIVE CV LAB;  Service: Cardiovascular;  Laterality: N/A;   SPHINCTEROTOMY  09/17/2020   Procedure: SPHINCTEROTOMY;  Surgeon: Aneita Gwendlyn DASEN, MD;  Location: [facility] ENDOSCOPY;  Service: Endoscopy;;   TEE WITHOUT CARDIOVERSION N/A 07/29/2020   Procedure: TRANSESOPHAGEAL ECHOCARDIOGRAM (TEE);  Surgeon: German Bartlett PEDLAR, MD;  Location: [facility] OR;  Service: Open Heart Surgery;  Laterality: N/A;    Social History   Socioeconomic History   Marital status: Single    Spouse name: Not on file   Number of children: 0  Years of education: Not on file   Highest education level: Not on file  Occupational History   Occupation: retired  Tobacco Use   Smoking status: Never   Smokeless tobacco: Never  Vaping Use   Vaping status: Never Used  Substance and Sexual Activity   Alcohol use: No   Drug use: No   Sexual activity: Not on file  Other Topics Concern   Not on file  Social History Narrative   Not on file   Social Drivers of Health    Financial Resource Strain: Not on file  Food Insecurity: No Food Insecurity (12/06/2022)   Hunger Vital Sign    Worried About Running Out of Food in the Last Year: Never true    Ran Out of Food in the Last Year: Never true  Transportation Needs: No Transportation Needs (12/06/2022)   PRAPARE - Administrator, Civil Service (Medical): No    Lack of Transportation (Non-Medical): No  Physical Activity: Not on file  Stress: Not on file  Social Connections: Not on file  Intimate Partner Violence: Not At Risk (12/06/2022)   Humiliation, Afraid, Rape, and Kick questionnaire    Fear of Current or Ex-Partner: No    Emotionally Abused: No    Physically Abused: No    Sexually Abused: No    Family History  Problem Relation Age of Onset   Heart disease Mother    Hyperlipidemia Mother    Heart disease Father    Hypertension Other     Current Outpatient Medications  Medication Sig Dispense Refill   dapagliflozin  propanediol (FARXIGA ) 10 MG TABS tablet Take 1 tablet (10 mg total) by mouth daily before breakfast. 90 tablet 3   rosuvastatin  (CRESTOR ) 20 MG tablet TAKE 1 TABLET BY MOUTH AT  BEDTIME 100 tablet 3   sacubitril -valsartan  (ENTRESTO ) 49-51 MG Take 1 tablet by mouth 2 (two) times daily. 180 tablet 3   spironolactone  (ALDACTONE ) 25 MG tablet TAKE 1 TABLET BY MOUTH DAILY 100 tablet 2   torsemide  (DEMADEX ) 20 MG tablet Take 1 tablet (20 mg total) by mouth daily as needed (weight gain or fluid retention). 90 tablet 3   XARELTO  20 MG TABS tablet TAKE 1 TABLET BY MOUTH DAILY  WITH SUPPER 100 tablet 2   Aspirin  81 MG CAPS Take 81 mg by mouth daily. (Patient not taking: Reported on 05/29/2024) 30 capsule 6   No current facility-administered medications for this visit.    No Known Allergies   REVIEW OF SYSTEMS:  [X]  denotes positive finding, [ ]  denotes negative finding Cardiac  Comments:  Chest pain or chest pressure:    Shortness of breath upon exertion:    Short of breath  when lying flat:    Irregular heart rhythm:        Vascular    Pain in calf, thigh, or hip brought on by ambulation:    Pain in feet at night that wakes you up from your sleep:     Blood clot in your veins:    Leg swelling:         Pulmonary    Oxygen at home:    Productive cough:     Wheezing:         Neurologic    Sudden weakness in arms or legs:     Sudden numbness in arms or legs:     Sudden onset of difficulty speaking or slurred speech:    Temporary loss of vision in one eye:  Problems with dizziness:         Gastrointestinal    Blood in stool:     Vomited blood:         Genitourinary    Burning when urinating:     Blood in urine:        Psychiatric    Major depression:         Hematologic    Bleeding problems:    Problems with blood clotting too easily:        Skin    Rashes or ulcers:        Constitutional    Fever or chills:      PHYSICAL EXAMINATION:  Vitals:   05/29/24 1005 05/29/24 1007  BP: 128/68 (!) 146/88  Pulse: 62   Temp: 98.3 F (36.8 C)   SpO2: 100%   Weight: 237 lb (107.5 kg)   Height: 6' 1 (1.854 m)     General:  WDWN in NAD; vital signs documented above Gait: Normal HENT: WNL, normocephalic Pulmonary: normal non-labored breathing Cardiac: irregular HR Abdomen: soft Vascular Exam/Pulses: 2+ radial, 2+ DP pulse bilaterally Extremities: without ischemic changes, without Gangrene , without cellulitis; without open wounds;  Musculoskeletal: no muscle wasting or atrophy  Neurologic: A&O X 3 Psychiatric:  The pt has Normal affect.   Non-Invasive Vascular Imaging:   VAS US  carotid Duplex: Summary:  Right Carotid: Velocities in the right ICA are consistent with a 80-99% stenosis. Several spectral Doppler samples obtained. Only one sample obtained in the 80-99% stenosis category which may be slightly overestimated due to cardiac arrhytmia. All other samples  taken reflect a 60-79% stenosis.   Left Carotid: Velocities in the  left ICA are consistent with a 60-79% stenosis.   Vertebrals: Bilateral vertebral arteries demonstrate antegrade flow.  Subclavians: Normal flow hemodynamics were seen in bilateral subclavian arteries.    ASSESSMENT/PLAN:: 73 y.o. male here for follow up for carotid artery stenosis. He remains without any associated TIA or stroke like symptoms. His duplex today shows increased velocities bilaterally, Right > left from his prior duplex in January. His right ICA now with 80-99% stenosis, left 60-79% stenosis. Normal flow in the vertebral and subclavian arteries bilaterally.  - I have recommended further evaluation with CTA neck - continue Statin, Aspirin , Xarelto  - Will arrange CTA neck in near future and he will follow up with Dr. Magda to review CTA and discuss management plans   Teretha Damme, PA-C [facility] [phone]  Clinic MD:   Fleeta Magda

## 2024-06-04 ENCOUNTER — Other Ambulatory Visit (HOSPITAL_COMMUNITY): Payer: Self-pay

## 2024-06-11 ENCOUNTER — Other Ambulatory Visit: Payer: Self-pay

## 2024-06-11 DIAGNOSIS — I6523 Occlusion and stenosis of bilateral carotid arteries: Secondary | ICD-10-CM

## 2024-06-15 ENCOUNTER — Other Ambulatory Visit (HOSPITAL_COMMUNITY): Payer: Self-pay

## 2024-06-20 ENCOUNTER — Ambulatory Visit (HOSPITAL_COMMUNITY)
Admission: RE | Admit: 2024-06-20 | Discharge: 2024-06-20 | Disposition: A | Discharge status: Home / Self Care | Source: Ambulatory Visit | Attending: Vascular Surgery | Admitting: Vascular Surgery

## 2024-06-20 DIAGNOSIS — I6523 Occlusion and stenosis of bilateral carotid arteries: Secondary | ICD-10-CM | POA: Insufficient documentation

## 2024-06-20 DIAGNOSIS — I6503 Occlusion and stenosis of bilateral vertebral arteries: Secondary | ICD-10-CM | POA: Insufficient documentation

## 2024-06-20 MED ORDER — IOHEXOL 350 MG/ML SOLN
75.0000 mL | Freq: Once | INTRAVENOUS | Status: AC | PRN
  Administered 2024-06-20 (×2): 75 mL via INTRAVENOUS

## 2024-07-09 NOTE — H&P (View-Only) (Signed)
 [facility] [place]  ASSESSMENT / PLAN: 73 y.o. male with bilateral severe carotid artery stenosis. CT angiogram did not quantify the stenosis, but by duplex criteria, the right side is critical (80-99%), visually the CT angiogram is similar.  Recommend:  Abstinence from all tobacco products. Blood glucose control with goal A1c < 7%. Blood pressure control with goal blood pressure < 130/80 mmHg. Lipid reduction therapy with goal LDL-C < 55 mg/dL. Aspirin  81mg  by mouth daily. Clopidogrel  75mg  by mouth daily. Xarelto  20mg  by mouth daily for Afib. Atorvastatin 40-80mg  PO QD (or other high intensity statin therapy).  I have completed Share Decision Making with [name] Rice prior to surgery.  Conversations included: -Discussion of all treatment options including carotid endarterectomy (CEA), CAS (which includes transcarotid artery revascularization (TCAR)), and optimal medical therapy (OMT)). -Explanation of risks and benefits for each option specific to [name] Rice clinical situation. -Integration of clinical guidelines as it relates to the patient's history and co-morbidities -Discussion and incorporation of [name] Rice and their personal preferences and priorities in choosing a treatment plan.  R TCAR would be best to treat his carotid stenosis. Will plan to do this in the coming weeks.   CHIEF COMPLAINT: Carotid artery stenosis  HISTORY OF PRESENT ILLNESS: [name] Rice is a 73 y.o. male who presents for follow up of carotid artery stenosis. We have been following his bilateral ICA stenosis since 2022. Bilateral ICA's on CTA/ Duplex have been in the 60-79% range.   Today he says he has been feeling the best he has in a while. He denies any visual changes, slurred speech, facial drooping, unilateral upper or lower extremity weakness or numbness. He is medically managed on Aspirin , Statin and Xarelto .  07/10/2024.  Patient returns to clinic for follow-up  after CT angiogram.  We reviewed the risk, benefits, and alternatives to intervention for asymptomatic critical arter carotid artery stenosis.  Past Medical History:  Diagnosis Date   Atrial fibrillation (HCC)    Carotid artery stenosis    Chronic diastolic (congestive) heart failure (HCC)    Coronary artery disease    Dysrhythmia    Hyperlipidemia    Hypertension    LBBB (left bundle branch block)     Past Surgical History:  Procedure Laterality Date   CARDIOVERSION N/A 09/28/2016   Procedure: CARDIOVERSION;  Surgeon: Gordy Bergamo, MD;  Location: [facility] ENDOSCOPY;  Service: Cardiovascular;  Laterality: N/A;   CATARACT EXTRACTION, BILATERAL     CHEST TUBE INSERTION N/A 06/24/2021   Procedure: CHEST TUBE INSERTION;  Surgeon: Kara Dorn NOVAK, MD;  Location: [facility] ENDOSCOPY;  Service: Pulmonary;  Laterality: N/A;   CHOLECYSTECTOMY N/A 09/18/2020   Procedure: LAPAROSCOPIC PARTIAL CHOLECYSTECTOMY;  Surgeon: Ethyl Lenis, MD;  Location: [facility] ORS;  Service: General;  Laterality: N/A;   CLIPPING OF ATRIAL APPENDAGE Left 07/29/2020   Procedure: CLIPPING OF ATRIAL APPENDAGE;  Surgeon: German Bartlett PEDLAR, MD;  Location: [facility] OR;  Service: Open Heart Surgery;  Laterality: Left;   CORONARY ARTERY BYPASS GRAFT N/A 07/29/2020   Procedure: CORONARY ARTERY BYPASS GRAFTING (CABG) TIMES FOUR USING BILATERAL INTERNAL MAMMARIES AND LEFT RADIAL ARTERY;  Surgeon: German Bartlett PEDLAR, MD;  Location: [facility] OR;  Service: Open Heart Surgery;  Laterality: N/A;   ERCP N/A 09/17/2020   Procedure: ENDOSCOPIC RETROGRADE CHOLANGIOPANCREATOGRAPHY (ERCP);  Surgeon: Aneita Gwendlyn DASEN, MD;  Location: [facility]COPY;  Service: Endoscopy;  Laterality: N/A;   IR THORACENTESIS ASP PLEURAL SPACE W/IMG GUIDE  06/23/2021   LEFT HEART  CATH AND CORONARY ANGIOGRAPHY N/A 07/25/2020   Procedure: LEFT HEART CATH AND CORONARY ANGIOGRAPHY;  Surgeon: Ladona Heinz, MD;  [facility]: [facility] INVASIVE CV LAB;  Service: Cardiovascular;  Laterality: N/A;   MAZE N/A 07/29/2020    Procedure: MAZE;  Surgeon: German Bartlett PEDLAR, MD;  [facility]: [facility] OR;  Service: Open Heart Surgery;  Laterality: N/A;   RADIAL ARTERY HARVEST Left 07/29/2020   Procedure: RADIAL ARTERY HARVEST;  Surgeon: German Bartlett PEDLAR, MD;  [facility] OR;  Service: Open Heart Surgery;  Laterality: Left;   REMOVAL OF STONES  09/17/2020   Procedure: REMOVAL OF STONES;  Surgeon: Aneita Gwendlyn DASEN, MD;  Location: [facility] ENDOSCOPY;  Service: Endoscopy;;   RIGHT HEART CATH N/A 12/07/2022   Procedure: RIGHT HEART CATH;  Surgeon: Elmira Newman PARAS, MD;  [facility] INVASIVE CV LAB;  Service: Cardiovascular;  Laterality: N/A;   SPHINCTEROTOMY  09/17/2020   Procedure: SPHINCTEROTOMY;  Surgeon: Aneita Gwendlyn DASEN, MD;  Location: [facility] ENDOSCOPY;  Service: Endoscopy;;   TEE WITHOUT CARDIOVERSION N/A 07/29/2020   Procedure: TRANSESOPHAGEAL ECHOCARDIOGRAM (TEE);  Surgeon: German Bartlett PEDLAR, MD;  Location: [facility] OR;  Service: Open Heart Surgery;  Laterality: N/A;    Family History  Problem Relation Age of Onset   Heart disease Mother    Hyperlipidemia Mother    Heart disease Father    Hypertension Other     Social History   Socioeconomic History   Marital status: Single    Spouse name: Not on file   Number of children: 0   Years of education: Not on file   Highest education level: Not on file  Occupational History   Occupation: retired  Tobacco Use   Smoking status: Never   Smokeless tobacco: Never  Vaping Use   Vaping status: Never Used  Substance and Sexual Activity   Alcohol use: No   Drug use: No   Sexual activity: Not on file  Other Topics Concern   Not on file  Social History Narrative   Not on file   Social Drivers of Health   Financial Resource Strain: Not on file  Food Insecurity: No Food Insecurity (12/06/2022)   Hunger Vital Sign    Worried About Running Out of Food in the Last Year: Never true    Ran Out of Food in the Last Year: Never true  Transportation Needs: No Transportation Needs  (12/06/2022)   PRAPARE - Administrator, Civil Service (Medical): No    Lack of Transportation (Non-Medical): No  Physical Activity: Not on file  Stress: Not on file  Social Connections: Not on file  Intimate Partner Violence: Not At Risk (12/06/2022)   Humiliation, Afraid, Rape, and Kick questionnaire    Fear of Current or Ex-Partner: No    Emotionally Abused: No    Physically Abused: No    Sexually Abused: No    No Known Allergies  Current Outpatient Medications  Medication Sig Dispense Refill   clopidogrel  (PLAVIX ) 75 MG tablet Take 1 tablet (75 mg total) by mouth daily. 30 tablet 6   dapagliflozin  propanediol (FARXIGA ) 10 MG TABS tablet Take 1 tablet (10 mg total) by mouth daily before breakfast. 90 tablet 3   rosuvastatin  (CRESTOR ) 20 MG tablet TAKE 1 TABLET BY MOUTH AT  BEDTIME 100 tablet 3   sacubitril -valsartan  (ENTRESTO ) 49-51 MG Take 1 tablet by mouth 2 (two) times daily. 180 tablet 3   spironolactone  (ALDACTONE ) 25 MG tablet TAKE 1 TABLET BY MOUTH DAILY 100 tablet 2  torsemide  (DEMADEX ) 20 MG tablet Take 1 tablet (20 mg total) by mouth daily as needed (weight gain or fluid retention). 90 tablet 3   XARELTO  20 MG TABS tablet TAKE 1 TABLET BY MOUTH DAILY  WITH SUPPER 100 tablet 2   Aspirin  81 MG CAPS Take 81 mg by mouth daily. (Patient not taking: Reported on 07/10/2024) 30 capsule 6   No current facility-administered medications for this visit.    PHYSICAL EXAM Vitals:   07/10/24 1426  BP: 134/65  Pulse: 60  Temp: 98.3 F (36.8 C)  SpO2: 98%  Weight: 236 lb (107 kg)  Height: 6' 1 (1.854 m)   Elderly man in no distress Regular rate and rhythm Unlabored breathing Cranial nerve exam Normal gait and station No extremity weakness   PERTINENT LABORATORY AND RADIOLOGIC DATA  Most recent CBC    Latest Ref Rng & Units 01/31/2024    9:34 AM 12/08/2022    1:26 AM 12/07/2022    4:22 PM  CBC  WBC 4.0 - 10.5 K/uL  5.2    Hemoglobin 13.0 - 17.7 g/dL 89.9   9.5  89.0    89.0   Hematocrit 37.5 - 51.0 % 35.3  32.9  32.0    32.0   Platelets 150 - 400 K/uL  207       Most recent CMP    Latest Ref Rng & Units 01/31/2024    9:34 AM 12/23/2022   10:21 AM 12/16/2022    9:44 AM  CMP  Glucose 70 - 99 mg/dL 95  863  72   BUN 8 - 27 mg/dL 22  17  15    Creatinine 0.76 - 1.27 mg/dL 8.61  8.78  8.84   Sodium 134 - 144 mmol/L 138  142  141   Potassium 3.5 - 5.2 mmol/L 4.9  4.9  4.8   Chloride 96 - 106 mmol/L 104  103  104   CO2 20 - 29 mmol/L 21  20  21    Calcium  8.6 - 10.2 mg/dL 9.2  9.0  9.1     Renal function CrCl cannot be calculated (Patient's most recent lab result is older than the maximum 21 days allowed.).  Hgb A1c MFr Bld (%)  Date Value  07/26/2020 5.2    LDL Chol Calc (NIH)  Date Value Ref Range Status  01/31/2024 41 0 - 99 mg/dL Final   Direct LDL  Date Value Ref Range Status  12/07/2022 38 0 - 99 mg/dL Final    Comment:    Performed at [facility], [address] [place], [place] [address]    CT angiogram personally reviewed.  There is a eggshell type calcified plaque in the right internal carotid artery about the bifurcation.  It appears most amenable to stenting given its anatomic location.  Debby SAILOR. Magda, MD FACS [facility] Phone Number: [phone] 07/10/2024 9:42 PM   Total time spent on preparing this encounter including chart review, data review, collecting history, examining the patient, and coordinating care: 30 minutes  Portions of this report may have been transcribed using voice recognition software.  Every effort has been made to ensure accuracy; however, inadvertent computerized transcription errors may still be present.

## 2024-07-09 NOTE — Progress Notes (Unsigned)
 [facility] [place]  ASSESSMENT / PLAN: 73 y.o. male with bilateral severe carotid artery stenosis. CT angiogram did not quantify the stenosis, but by duplex criteria, the right side is critical (80-99%), visually the CT angiogram is similar.  Recommend:  Abstinence from all tobacco products. Blood glucose control with goal A1c < 7%. Blood pressure control with goal blood pressure < 130/80 mmHg. Lipid reduction therapy with goal LDL-C < 55 mg/dL. Aspirin  81mg  by mouth daily. Clopidogrel  75mg  by mouth daily. Xarelto  20mg  by mouth daily for Afib. Atorvastatin 40-80mg  PO QD (or other high intensity statin therapy).  I have completed Share Decision Making with [name] Rice prior to surgery.  Conversations included: -Discussion of all treatment options including carotid endarterectomy (CEA), CAS (which includes transcarotid artery revascularization (TCAR)), and optimal medical therapy (OMT)). -Explanation of risks and benefits for each option specific to [employer] clinical situation. -Integration of clinical guidelines as it relates to the patient's history and co-morbidities -Discussion and incorporation of [name] Rice and their personal preferences and priorities in choosing a treatment plan.  R TCAR would be best to treat his carotid stenosis. Will plan to do this in the coming weeks.   CHIEF COMPLAINT: Carotid artery stenosis  HISTORY OF PRESENT ILLNESS: [name] Rice is a 73 y.o. male who presents for follow up of carotid artery stenosis. We have been following his bilateral ICA stenosis since 2022. Bilateral ICA's on CTA/ Duplex have been in the 60-79% range.   Today he says he has been feeling the best he has in a while. He denies any visual changes, slurred speech, facial drooping, unilateral upper or lower extremity weakness or numbness. He is medically managed on Aspirin , Statin and Xarelto .  07/10/2024.  Patient returns to clinic for follow-up  after CT angiogram.  We reviewed the risk, benefits, and alternatives to intervention for asymptomatic critical arter carotid artery stenosis.  Past Medical History:  Diagnosis Date   Atrial fibrillation (HCC)    Carotid artery stenosis    Chronic diastolic (congestive) heart failure (HCC)    Coronary artery disease    Dysrhythmia    Hyperlipidemia    Hypertension    LBBB (left bundle branch block)     Past Surgical History:  Procedure Laterality Date   CARDIOVERSION N/A 09/28/2016   Procedure: CARDIOVERSION;  Surgeon: Gordy Bergamo, MD;  Location: [facility] ENDOSCOPY;  Service: Cardiovascular;  Laterality: N/A;   CATARACT EXTRACTION, BILATERAL     CHEST TUBE INSERTION N/A 06/24/2021   Procedure: CHEST TUBE INSERTION;  Surgeon: Kara Dorn NOVAK, MD;  Location: [facility] ENDOSCOPY;  Service: Pulmonary;  Laterality: N/A;   CHOLECYSTECTOMY N/A 09/18/2020   Procedure: LAPAROSCOPIC PARTIAL CHOLECYSTECTOMY;  Surgeon: Ethyl Lenis, MD;  Location: [facility] ORS;  Service: General;  Laterality: N/A;   CLIPPING OF ATRIAL APPENDAGE Left 07/29/2020   Procedure: CLIPPING OF ATRIAL APPENDAGE;  Surgeon: German Bartlett PEDLAR, MD;  Location[facility] OR;  Service: Open Heart Surgery;  Laterality: Left;   CORONARY ARTERY BYPASS GRAFT N/A 07/29/2020   Procedure: CORONARY ARTERY BYPASS GRAFTING (CABG) TIMES FOUR USING BILATERAL INTERNAL MAMMARIES AND LEFT RADIAL ARTERY;  Surgeon: German Bartlett PEDLAR, MD;  Location: [facility] OR;  Service: Open Heart Surgery;  Laterality: N/A;   ERCP N/A 09/17/2020   Procedure: ENDOSCOPIC RETROGRADE CHOLANGIOPANCREATOGRAPHY (ERCP);  Surgeon: Aneita Gwendlyn DASEN, MD;  Location: [facility];  Service: Endoscopy;  Laterality: N/A;   IR THORACENTESIS ASP PLEURAL SPACE W/IMG GUIDE  06/23/2021   LEFT HEART  CATH AND CORONARY ANGIOGRAPHY N/A 07/25/2020   Procedure: LEFT HEART CATH AND CORONARY ANGIOGRAPHY;  Surgeon: Ladona Heinz, MD;  [facility]: [facility] INVASIVE CV LAB;  Service: Cardiovascular;  Laterality: N/A;   MAZE N/A 07/29/2020    Procedure: MAZE;  Surgeon: German Bartlett PEDLAR, MD;  Location: [facility] OR;  Service: Open Heart Surgery;  Laterality: N/A;   RADIAL ARTERY HARVEST Left 07/29/2020   Procedure: RADIAL ARTERY HARVEST;  Surgeon: German Bartlett PEDLAR, MD;  Location: [facility] OR;  Service: Open Heart Surgery;  Laterality: Left;   REMOVAL OF STONES  09/17/2020   Procedure: REMOVAL OF STONES;  Surgeon: Aneita Gwendlyn DASEN, MD;  Location: [facility] ENDOSCOPY;  Service: Endoscopy;;   RIGHT HEART CATH N/A 12/07/2022   Procedure: RIGHT HEART CATH;  Surgeon: Elmira Newman PARAS, MD;  [facility] INVASIVE CV LAB;  Service: Cardiovascular;  Laterality: N/A;   SPHINCTEROTOMY  09/17/2020   Procedure: SPHINCTEROTOMY;  Surgeon: Aneita Gwendlyn DASEN, MD;  Location: [facility] ENDOSCOPY;  Service: Endoscopy;;   TEE WITHOUT CARDIOVERSION N/A 07/29/2020   Procedure: TRANSESOPHAGEAL ECHOCARDIOGRAM (TEE);  Surgeon: German Bartlett PEDLAR, MD;  Location: [facility] OR;  Service: Open Heart Surgery;  Laterality: N/A;    Family History  Problem Relation Age of Onset   Heart disease Mother    Hyperlipidemia Mother    Heart disease Father    Hypertension Other     Social History   Socioeconomic History   Marital status: Single    Spouse name: Not on file   Number of children: 0   Years of education: Not on file   Highest education level: Not on file  Occupational History   Occupation: retired  Tobacco Use   Smoking status: Never   Smokeless tobacco: Never  Vaping Use   Vaping status: Never Used  Substance and Sexual Activity   Alcohol use: No   Drug use: No   Sexual activity: Not on file  Other Topics Concern   Not on file  Social History Narrative   Not on file   Social Drivers of Health   Financial Resource Strain: Not on file  Food Insecurity: No Food Insecurity (12/06/2022)   Hunger Vital Sign    Worried About Running Out of Food in the Last Year: Never true    Ran Out of Food in the Last Year: Never true  Transportation Needs: No Transportation Needs  (12/06/2022)   PRAPARE - Administrator, Civil Service (Medical): No    Lack of Transportation (Non-Medical): No  Physical Activity: Not on file  Stress: Not on file  Social Connections: Not on file  Intimate Partner Violence: Not At Risk (12/06/2022)   Humiliation, Afraid, Rape, and Kick questionnaire    Fear of Current or Ex-Partner: No    Emotionally Abused: No    Physically Abused: No    Sexually Abused: No    No Known Allergies  Current Outpatient Medications  Medication Sig Dispense Refill   clopidogrel  (PLAVIX ) 75 MG tablet Take 1 tablet (75 mg total) by mouth daily. 30 tablet 6   dapagliflozin  propanediol (FARXIGA ) 10 MG TABS tablet Take 1 tablet (10 mg total) by mouth daily before breakfast. 90 tablet 3   rosuvastatin  (CRESTOR ) 20 MG tablet TAKE 1 TABLET BY MOUTH AT  BEDTIME 100 tablet 3   sacubitril -valsartan  (ENTRESTO ) 49-51 MG Take 1 tablet by mouth 2 (two) times daily. 180 tablet 3   spironolactone  (ALDACTONE ) 25 MG tablet TAKE 1 TABLET BY MOUTH DAILY 100 tablet 2  torsemide  (DEMADEX ) 20 MG tablet Take 1 tablet (20 mg total) by mouth daily as needed (weight gain or fluid retention). 90 tablet 3   XARELTO  20 MG TABS tablet TAKE 1 TABLET BY MOUTH DAILY  WITH SUPPER 100 tablet 2   Aspirin  81 MG CAPS Take 81 mg by mouth daily. (Patient not taking: Reported on 07/10/2024) 30 capsule 6   No current facility-administered medications for this visit.    PHYSICAL EXAM Vitals:   07/10/24 1426  BP: 134/65  Pulse: 60  Temp: 98.3 F (36.8 C)  SpO2: 98%  Weight: 236 lb (107 kg)  Height: 6' 1 (1.854 m)   Elderly man in no distress Regular rate and rhythm Unlabored breathing Cranial nerve exam Normal gait and station No extremity weakness   PERTINENT LABORATORY AND RADIOLOGIC DATA  Most recent CBC    Latest Ref Rng & Units 01/31/2024    9:34 AM 12/08/2022    1:26 AM 12/07/2022    4:22 PM  CBC  WBC 4.0 - 10.5 K/uL  5.2    Hemoglobin 13.0 - 17.7 g/dL 89.9   9.5  89.0    89.0   Hematocrit 37.5 - 51.0 % 35.3  32.9  32.0    32.0   Platelets 150 - 400 K/uL  207       Most recent CMP    Latest Ref Rng & Units 01/31/2024    9:34 AM 12/23/2022   10:21 AM 12/16/2022    9:44 AM  CMP  Glucose 70 - 99 mg/dL 95  863  72   BUN 8 - 27 mg/dL 22  17  15    Creatinine 0.76 - 1.27 mg/dL 8.61  8.78  8.84   Sodium 134 - 144 mmol/L 138  142  141   Potassium 3.5 - 5.2 mmol/L 4.9  4.9  4.8   Chloride 96 - 106 mmol/L 104  103  104   CO2 20 - 29 mmol/L 21  20  21    Calcium  8.6 - 10.2 mg/dL 9.2  9.0  9.1     Renal function CrCl cannot be calculated (Patient's most recent lab result is older than the maximum 21 days allowed.).  Hgb A1c MFr Bld (%)  Date Value  07/26/2020 5.2    LDL Chol Calc (NIH)  Date Value Ref Range Status  01/31/2024 41 0 - 99 mg/dL Final   Direct LDL  Date Value Ref Range Status  12/07/2022 38 0 - 99 mg/dL Final    Comment:    Performed at [facility], [address] [place], [place] [address]    CT angiogram personally reviewed.  There is a eggshell type calcified plaque in the right internal carotid artery about the bifurcation.  It appears most amenable to stenting given its anatomic location.  Debby SAILOR. Magda, MD FACS [facility] Phone Number: [phone] 07/10/2024 9:42 PM   Total time spent on preparing this encounter including chart review, data review, collecting history, examining the patient, and coordinating care: 30 minutes  Portions of this report may have been transcribed using voice recognition software.  Every effort has been made to ensure accuracy; however, inadvertent computerized transcription errors may still be present.

## 2024-07-10 ENCOUNTER — Ambulatory Visit: Attending: Vascular Surgery | Admitting: Vascular Surgery

## 2024-07-10 ENCOUNTER — Other Ambulatory Visit: Payer: Self-pay

## 2024-07-10 ENCOUNTER — Encounter: Payer: Self-pay | Admitting: Vascular Surgery

## 2024-07-10 VITALS — BP 134/65 | HR 60 | Temp 98.3°F | Ht 73.0 in | Wt 236.0 lb

## 2024-07-10 DIAGNOSIS — I6523 Occlusion and stenosis of bilateral carotid arteries: Secondary | ICD-10-CM | POA: Diagnosis not present

## 2024-07-10 MED ORDER — CLOPIDOGREL BISULFATE 75 MG PO TABS: 75.0000 mg | ORAL_TABLET | Freq: Every day | ORAL | 6 refills | Status: AC

## 2024-07-17 NOTE — Progress Notes (Addendum)
 Surgical Instructions   Your procedure is scheduled on Thursday, September 11th.. Report to [facility] Main Entrance A at 7:55 A.M., then check in with the Admitting office. Any questions or running late day of surgery: call [phone]  Questions prior to your surgery date: call [phone], Monday-Friday, 8am-4pm. If you experience any cold or flu symptoms such as cough, fever, chills, shortness of breath, etc. between now and your scheduled surgery, please notify us  at the above number.     Remember:  Do not eat or drink after midnight the night before your surgery  Take these medicines the morning of surgery with A SIP OF WATER   Aspirin   clopidogrel  (PLAVIX )    May take these medicines IF NEEDED: NONE   Per your cardiologist's instruction, HOLD your XARELTO  for 3 day's prior to surgery.  Last dose on Sunday, Sept. 7th.  HOLD your dapagliflozin  propanediol (FARXIGA ) for 72 hours prior to surgery.  Last dose on Sunday, September 7th.   One week prior to surgery, STOP taking any Aspirin  (unless otherwise instructed by your surgeon) Aleve, Naproxen, Ibuprofen, Motrin, Advil, Goody's, BC's, all herbal medications, fish oil, and non-prescription vitamins.                     Do NOT Smoke (Tobacco/Vaping) for 24 hours prior to your procedure.  If you use a CPAP at night, you may bring your mask/headgear for your overnight stay.   You will be asked to remove any contacts, glasses, piercing's, hearing aid's, dentures/partials prior to surgery. Please bring cases for these items if needed.    Patients discharged the day of surgery will not be allowed to drive home, and someone needs to stay with them for 24 hours.  SURGICAL WAITING ROOM VISITATION Patients may have no more than 2 support people in the waiting area - these visitors may rotate.   Pre-op nurse will coordinate an appropriate time for 1 ADULT support person, who may not rotate, to accompany patient in pre-op.   Children under the age of 36 must have an adult with them who is not the patient and must remain in the main waiting area with an adult.  If the patient needs to stay at the hospital during part of their recovery, the visitor guidelines for inpatient rooms apply.  Please refer to the [facility] website for the visitor guidelines for any additional information.   If you received a COVID test during your pre-op visit  it is requested that you wear a mask when out in public, stay away from anyone that may not be feeling well and notify your surgeon if you develop symptoms. If you have been in contact with anyone that has tested positive in the last 10 days please notify you surgeon.      Pre-operative CHG Bathing Instructions   You can play a key role in reducing the risk of infection after surgery. Your skin needs to be as free of germs as possible. You can reduce the number of germs on your skin by washing with CHG (chlorhexidine  gluconate) soap before surgery. CHG is an antiseptic soap that kills germs and continues to kill germs even after washing.   DO NOT use if you have an allergy to chlorhexidine /CHG or antibacterial soaps. If your skin becomes reddened or irritated, stop using the CHG and notify one of our RNs at [phone].              TAKE A SHOWER THE NIGHT  BEFORE SURGERY AND THE DAY OF SURGERY    Please keep in mind the following:  DO NOT shave, including legs and underarms, 48 hours prior to surgery.   You may shave your face before/day of surgery.  Place clean sheets on your bed the night before surgery Use a clean washcloth (not used since being washed) for each shower. DO NOT sleep with pet's night before surgery.  CHG Shower Instructions:  Wash your face and private area with normal soap. If you choose to wash your hair, wash first with your normal shampoo.  After you use shampoo/soap, rinse your hair and body thoroughly to remove shampoo/soap residue.  Turn the  water  OFF and apply half the bottle of CHG soap to a CLEAN washcloth.  Apply CHG soap ONLY FROM YOUR NECK DOWN TO YOUR TOES (washing for 3-5 minutes)  DO NOT use CHG soap on face, private areas, open wounds, or sores.  Pay special attention to the area where your surgery is being performed.  If you are having back surgery, having someone wash your back for you may be helpful. Wait 2 minutes after CHG soap is applied, then you may rinse off the CHG soap.  Pat dry with a clean towel  Put on clean pajamas    Additional instructions for the day of surgery: DO NOT APPLY any lotions, deodorants, cologne, or perfumes.   Do not wear jewelry or makeup Do not wear nail polish, gel polish, artificial nails, or any other type of covering on natural nails (fingers and toes) Do not bring valuables to the hospital. [facility] is not responsible for valuables/personal belongings. Put on clean/comfortable clothes.  Please brush your teeth.  Ask your nurse before applying any prescription medications to the skin.

## 2024-07-18 ENCOUNTER — Inpatient Hospital Stay (HOSPITAL_COMMUNITY)
Admission: RE | Admit: 2024-07-18 | Discharge: 2024-07-18 | Disposition: A | Discharge status: Home / Self Care | Source: Ambulatory Visit

## 2024-07-19 ENCOUNTER — Inpatient Hospital Stay (HOSPITAL_COMMUNITY): Admission: RE | Admit: 2024-07-19 | Source: Home / Self Care | Admitting: Vascular Surgery

## 2024-07-19 ENCOUNTER — Encounter (HOSPITAL_COMMUNITY): Admission: RE | Payer: Self-pay | Source: Home / Self Care

## 2024-07-19 SURGERY — TRANSCAROTID ARTERY REVASCULARIZATION (TCAR)
Anesthesia: General | Laterality: Right

## 2024-07-23 ENCOUNTER — Other Ambulatory Visit (HOSPITAL_COMMUNITY): Payer: Self-pay

## 2024-07-23 ENCOUNTER — Other Ambulatory Visit: Payer: Self-pay

## 2024-08-06 ENCOUNTER — Other Ambulatory Visit: Payer: Self-pay

## 2024-08-06 DIAGNOSIS — I6523 Occlusion and stenosis of bilateral carotid arteries: Secondary | ICD-10-CM

## 2024-08-06 NOTE — Progress Notes (Signed)
 Surgical Instructions   Your procedure is scheduled on Thursday, October 2nd, 2025. Report to [facility] Main Entrance A at 5:30 A.M., then check in with the Admitting office. Any questions or running late day of surgery: call [phone]  Questions prior to your surgery date: call [phone], Monday-Friday, 8am-4pm. If you experience any cold or flu symptoms such as cough, fever, chills, shortness of breath, etc. between now and your scheduled surgery, please notify us  at the above number.     Remember:  Do not eat or drink after midnight the night before your surgery   Take these medicines the morning of surgery with A SIP OF WATER : Aspirin  Clopidogrel  (Plavix )    May take these medicines IF NEEDED: None.   Per your surgeon's instructions, Xarelto  should be held for 3 days prior to your surgery.  Your last dose should be on Sunday, September 28th, 2025.   Dapagliflozin  Propanediol (Farxiga ) should be held for 3 days prior to your surgery.  Your last dose should be on Sunday, September 28th, 2025.     One week prior to surgery, STOP taking any Aspirin  (unless otherwise instructed by your surgeon) Aleve, Naproxen, Ibuprofen, Motrin, Advil, Goody's, BC's, all herbal medications, fish oil, and non-prescription vitamins.                     Do NOT Smoke (Tobacco/Vaping) for 24 hours prior to your procedure.  If you use a CPAP at night, you may bring your mask/headgear for your overnight stay.   You will be asked to remove any contacts, glasses, piercing's, hearing aid's, dentures/partials prior to surgery. Please bring cases for these items if needed.    Patients discharged the day of surgery will not be allowed to drive home, and someone needs to stay with them for 24 hours.  SURGICAL WAITING ROOM VISITATION Patients may have no more than 2 support people in the waiting area - these visitors may rotate.   Pre-op nurse will coordinate an appropriate time for 1 ADULT  support person, who may not rotate, to accompany patient in pre-op.  Children under the age of 49 must have an adult with them who is not the patient and must remain in the main waiting area with an adult.  If the patient needs to stay at the hospital during part of their recovery, the visitor guidelines for inpatient rooms apply.  Please refer to the [facility] website for the visitor guidelines for any additional information.   If you received a COVID test during your pre-op visit  it is requested that you wear a mask when out in public, stay away from anyone that may not be feeling well and notify your surgeon if you develop symptoms. If you have been in contact with anyone that has tested positive in the last 10 days please notify you surgeon.      Pre-operative CHG Bathing Instructions   You can play a key role in reducing the risk of infection after surgery. Your skin needs to be as free of germs as possible. You can reduce the number of germs on your skin by washing with CHG (chlorhexidine  gluconate) soap before surgery. CHG is an antiseptic soap that kills germs and continues to kill germs even after washing.   DO NOT use if you have an allergy to chlorhexidine /CHG or antibacterial soaps. If your skin becomes reddened or irritated, stop using the CHG and notify one of our RNs at [phone].  TAKE A SHOWER THE NIGHT BEFORE SURGERY AND THE DAY OF SURGERY    Please keep in mind the following:  DO NOT shave, including legs and underarms, 48 hours prior to surgery.   You may shave your face before/day of surgery.  Place clean sheets on your bed the night before surgery Use a clean washcloth (not used since being washed) for each shower. DO NOT sleep with pet's night before surgery.  CHG Shower Instructions:  Wash your face and private area with normal soap. If you choose to wash your hair, wash first with your normal shampoo.  After you use shampoo/soap, rinse your  hair and body thoroughly to remove shampoo/soap residue.  Turn the water  OFF and apply half the bottle of CHG soap to a CLEAN washcloth.  Apply CHG soap ONLY FROM YOUR NECK DOWN TO YOUR TOES (washing for 3-5 minutes)  DO NOT use CHG soap on face, private areas, open wounds, or sores.  Pay special attention to the area where your surgery is being performed.  If you are having back surgery, having someone wash your back for you may be helpful. Wait 2 minutes after CHG soap is applied, then you may rinse off the CHG soap.  Pat dry with a clean towel  Put on clean pajamas    Additional instructions for the day of surgery: DO NOT APPLY any lotions, deodorants, cologne, or perfumes.   Do not wear jewelry or makeup Do not wear nail polish, gel polish, artificial nails, or any other type of covering on natural nails (fingers and toes) Do not bring valuables to the hospital. [facility] is not responsible for valuables/personal belongings. Put on clean/comfortable clothes.  Please brush your teeth.  Ask your nurse before applying any prescription medications to the skin.

## 2024-08-07 ENCOUNTER — Encounter (HOSPITAL_COMMUNITY): Payer: Self-pay

## 2024-08-07 ENCOUNTER — Encounter (HOSPITAL_COMMUNITY)
Admission: RE | Admit: 2024-08-07 | Discharge: 2024-08-07 | Disposition: A | Discharge status: Home / Self Care | Source: Ambulatory Visit | Attending: Vascular Surgery | Admitting: Vascular Surgery

## 2024-08-07 ENCOUNTER — Other Ambulatory Visit: Payer: Self-pay

## 2024-08-07 VITALS — BP 158/68 | HR 81 | Temp 97.8°F | Resp 18 | Ht 73.0 in | Wt 236.9 lb

## 2024-08-07 DIAGNOSIS — Z01812 Encounter for preprocedural laboratory examination: Secondary | ICD-10-CM | POA: Insufficient documentation

## 2024-08-07 DIAGNOSIS — I6523 Occlusion and stenosis of bilateral carotid arteries: Secondary | ICD-10-CM | POA: Insufficient documentation

## 2024-08-07 DIAGNOSIS — Z7901 Long term (current) use of anticoagulants: Secondary | ICD-10-CM | POA: Insufficient documentation

## 2024-08-07 DIAGNOSIS — E785 Hyperlipidemia, unspecified: Secondary | ICD-10-CM | POA: Insufficient documentation

## 2024-08-07 DIAGNOSIS — D649 Anemia, unspecified: Secondary | ICD-10-CM | POA: Insufficient documentation

## 2024-08-07 DIAGNOSIS — I110 Hypertensive heart disease with heart failure: Secondary | ICD-10-CM | POA: Insufficient documentation

## 2024-08-07 DIAGNOSIS — Z01818 Encounter for other preprocedural examination: Secondary | ICD-10-CM

## 2024-08-07 DIAGNOSIS — I5032 Chronic diastolic (congestive) heart failure: Secondary | ICD-10-CM | POA: Insufficient documentation

## 2024-08-07 DIAGNOSIS — I2510 Atherosclerotic heart disease of native coronary artery without angina pectoris: Secondary | ICD-10-CM | POA: Insufficient documentation

## 2024-08-07 DIAGNOSIS — I4819 Other persistent atrial fibrillation: Secondary | ICD-10-CM | POA: Insufficient documentation

## 2024-08-07 DIAGNOSIS — Z951 Presence of aortocoronary bypass graft: Secondary | ICD-10-CM | POA: Insufficient documentation

## 2024-08-07 DIAGNOSIS — I447 Left bundle-branch block, unspecified: Secondary | ICD-10-CM | POA: Insufficient documentation

## 2024-08-07 DIAGNOSIS — E871 Hypo-osmolality and hyponatremia: Secondary | ICD-10-CM | POA: Insufficient documentation

## 2024-08-07 LAB — URINALYSIS, ROUTINE W REFLEX MICROSCOPIC
Bilirubin Urine: NEGATIVE
Glucose, UA: NEGATIVE mg/dL
Hgb urine dipstick: NEGATIVE
Ketones, ur: NEGATIVE mg/dL
Leukocytes,Ua: NEGATIVE
Nitrite: NEGATIVE
Protein, ur: 30 mg/dL — AB
Specific Gravity, Urine: 1.021 (ref 1.005–1.030)
pH: 5 (ref 5.0–8.0)

## 2024-08-07 LAB — PROTIME-INR
INR: 1.8 — ABNORMAL HIGH (ref 0.8–1.2)
Prothrombin Time: 21.4 s — ABNORMAL HIGH (ref 11.4–15.2)

## 2024-08-07 LAB — COMPREHENSIVE METABOLIC PANEL WITH GFR
ALT: 13 U/L (ref 0–44)
AST: 24 U/L (ref 15–41)
Albumin: 3.5 g/dL (ref 3.5–5.0)
Alkaline Phosphatase: 74 U/L (ref 38–126)
Anion gap: 10 (ref 5–15)
BUN: 18 mg/dL (ref 8–23)
CO2: 22 mmol/L (ref 22–32)
Calcium: 8.8 mg/dL — ABNORMAL LOW (ref 8.9–10.3)
Chloride: 100 mmol/L (ref 98–111)
Creatinine, Ser: 1.38 mg/dL — ABNORMAL HIGH (ref 0.61–1.24)
GFR, Estimated: 55 mL/min — ABNORMAL LOW (ref >60)
Glucose, Bld: 75 mg/dL (ref 70–99)
Potassium: 3.6 mmol/L (ref 3.5–5.1)
Sodium: 132 mmol/L — ABNORMAL LOW (ref 135–145)
Total Bilirubin: 1.7 mg/dL — ABNORMAL HIGH (ref 0.0–1.2)
Total Protein: 7.1 g/dL (ref 6.5–8.1)

## 2024-08-07 LAB — CBC
HCT: 41.4 % (ref 39.0–52.0)
Hemoglobin: 11.6 g/dL — ABNORMAL LOW (ref 13.0–17.0)
MCH: 21.4 pg — ABNORMAL LOW (ref 26.0–34.0)
MCHC: 28 g/dL — ABNORMAL LOW (ref 30.0–36.0)
MCV: 76.4 fL — ABNORMAL LOW (ref 80.0–100.0)
Platelets: 329 K/uL (ref 150–400)
RBC: 5.42 MIL/uL (ref 4.22–5.81)
RDW: 19.4 % — ABNORMAL HIGH (ref 11.5–15.5)
WBC: 5.7 K/uL (ref 4.0–10.5)
nRBC: 0 % (ref 0.0–0.2)

## 2024-08-07 LAB — TYPE AND SCREEN
ABO/RH(D): O POS
Antibody Screen: NEGATIVE

## 2024-08-07 LAB — SURGICAL PCR SCREEN
MRSA, PCR: NEGATIVE
Staphylococcus aureus: NEGATIVE

## 2024-08-07 LAB — APTT: aPTT: 51 s — ABNORMAL HIGH (ref 24–36)

## 2024-08-07 NOTE — Progress Notes (Addendum)
 PCP - denies Cardiologist - Dr. Madonna Large, LOV 01/26/2024  PPM/ICD - denies Device Orders - na Rep Notified - na  Chest x-ray - na EKG - 01/26/2024 Stress Test - 02/18/2020 ECHO - 12/07/2022 Cardiac Cath - 12/07/2022  Sleep Study -  denies CPAP - na  Non-diabetic  Farxiga , hold 72 hours, states last dose 08/05/2024.   Blood Thinner Instructions: Xarelto , hold 3 days, states last dose 08/05/2024; Plavix  continue Aspirin  Instructions: ASA. continue  ERAS Protcol - NPO  Anesthesia review: Yes. CHF, HTN, CAD, A-fib, LBBB  Patient denies shortness of breath, fever, cough and chest pain at PAT appointment   All instructions explained to the patient, with a verbal understanding of the material. Patient agrees to go over the instructions while at home for a better understanding. Patient also instructed to self quarantine after being tested for COVID-19. The opportunity to ask questions was provided.

## 2024-08-08 NOTE — Anesthesia Preprocedure Evaluation (Signed)
 Anesthesia Evaluation  Patient identified by MRN, date of birth, ID band Patient awake    Reviewed: Allergy & Precautions, NPO status , Patient's Chart, lab work & pertinent test results, reviewed documented beta blocker date and time   History of Anesthesia Complications Negative for: history of anesthetic complications  Airway Mallampati: II  TM Distance: >3 FB     Dental  (+) Missing, Loose   Pulmonary shortness of breath and with exertion, neg COPD, neg recent URI   breath sounds clear to auscultation       Cardiovascular hypertension, pulmonary hypertension (mod)+ CAD, + CABG and +CHF  + dysrhythmias (-) pacemaker(-) Cardiac Defibrillator  Rhythm:Regular Rate:Normal  RA: 7 mmHg RV: 72/2 mmHg PA: 74/9 mmHg, mPAP 30 mmHg PCW: 19 mmHg with tall v wave  IMPRESSIONS     1. Left ventricular ejection fraction, by estimation, is 60 to 65%. The  left ventricle has normal function. The left ventricle has no regional  wall motion abnormalities. Left ventricular diastolic function could not  be evaluated.   2. Right ventricular systolic function is mildly reduced. The right  ventricular size is normal. There is mildly elevated pulmonary artery  systolic pressure. The estimated right ventricular systolic pressure is  37.1 mmHg.   3. The mitral valve is degenerative. Trivial mitral valve regurgitation.  No evidence of mitral stenosis. Moderate mitral annular calcification.   4. The aortic valve is calcified. Aortic valve regurgitation is not  visualized. Aortic valve sclerosis is present, with no evidence of aortic  valve stenosis.   5. The inferior vena cava is normal in size with greater than 50%  respiratory variability, suggesting right atrial pressure of 3 mmHg.     Neuro/Psych neg Seizures Summary:  Right Carotid: Velocities in the right ICA are consistent with a 80-99%                 stenosis.                 Several  spectral Doppler samples obtained. Only one sample                 obtained in the 80-99% stenosis category which may be  slightly                overestimated due to cardiac arrhytmia. All other samples  taken                reflect a 60-79% stenosis.   Left Carotid: Velocities in the left ICA are consistent with a 60-79%  stenosis.      GI/Hepatic ,,,(+) neg Cirrhosis        Endo/Other    Renal/GU CRFRenal disease     Musculoskeletal   Abdominal   Peds  Hematology  (+) Blood dyscrasia, anemia   Anesthesia Other Findings   Reproductive/Obstetrics                              Anesthesia Physical Anesthesia Plan  ASA: 3  Anesthesia Plan: General   Post-op Pain Management:    Induction: Intravenous  PONV Risk Score and Plan: 2 and Ondansetron  and Dexamethasone   Airway Management Planned: Oral ETT  Additional Equipment: Arterial line  Intra-op Plan:   Post-operative Plan: Extubation in OR  Informed Consent: I have reviewed the patients History and Physical, chart, labs and discussed the procedure including the risks, benefits and alternatives for the proposed anesthesia with the  patient or authorized representative who has indicated his/her understanding and acceptance.     Dental advisory given  Plan Discussed with: CRNA  Anesthesia Plan Comments: (PAT note by Lynwood Hope, PA-C: 73 year old male follows with cardiology for history of HFpEF, recovered cardiomyopathy, CAD s/p CABG x 4 07/2020, s/p maze procedure with left atrial appendage ligation on Xarelto , asymptomatic bilateral carotid artery stenosis, history of NSVT, persistent atrial fibrillation, HTN, HLD, left bundle branch block.  Most recent echo 11/2022 with LVEF 60 to 65%, normal wall motion, RV systolic function mildly reduced, mild PASP 37 mmHg, no significant valvular abnormalities.  Last seen by Dr. Michele on 01/26/2024 and noted to be feeling well from cardiac  standpoint.  Current medications were continued and he was recommended to follow-up in 1 year.  Patient seen in follow-up by vascular surgeon Dr. Vonzell on 07/10/2024 for reevaluation of asymptomatic carotid stenosis.  Recent imaging showed critical (80 to 99%) stenosis in the R ICA.  He has 60 to 79% stenosis in the LICA.  Right TCAR recommended.  Patient reports last dose of Xarelto  08/05/2024.  Preop labs reviewed, mild hyponatremia sodium 132, creatinine mildly elevated 1.38, mild anemia with hemoglobin 1.6.  EKG 01/26/2024: Atrial fibrillation.  Rate 65. Indeterminate axis. Non-specific intra-ventricular conduction block. Minimal voltage criteria for LVH, may be normal variant. Lateral infarct , age undetermined.  No significant change.  Carotid duplex 05/29/2024: Summary:  Right Carotid: Velocities in the right ICA are consistent with a 80-99%  stenosis.  Several spectral Doppler samples obtained. Only one sample obtained in the 80-99% stenosis category which may be slightly overestimated due to cardiac arrhytmia. All other samples taken reflect a 60-79% stenosis.  Left Carotid: Velocities in the left ICA are consistent with a 60-79% stenosis.  Vertebrals: Bilateral vertebral arteries demonstrate antegrade flow.  Subclavians: Normal flow hemodynamics were seen in bilateral subclavian arteries.   TTE 12/07/2022: 1. Left ventricular ejection fraction, by estimation, is 60 to 65%. The  left ventricle has normal function. The left ventricle has no regional  wall motion abnormalities. Left ventricular diastolic function could not  be evaluated.   2. Right ventricular systolic function is mildly reduced. The right  ventricular size is normal. There is mildly elevated pulmonary artery  systolic pressure. The estimated right ventricular systolic pressure is  37.1 mmHg.   3. The mitral valve is degenerative. Trivial mitral valve regurgitation.  No evidence of mitral stenosis. Moderate mitral  annular calcification.   4. The aortic valve is calcified. Aortic valve regurgitation is not  visualized. Aortic valve sclerosis is present, with no evidence of aortic  valve stenosis.   5. The inferior vena cava is normal in size with greater than 50%  respiratory variability, suggesting right atrial pressure of 3 mmHg.    )         Anesthesia Quick Evaluation

## 2024-08-08 NOTE — Progress Notes (Signed)
 Anesthesia Chart Review:  73 year old male follows with cardiology for history of HFpEF, recovered cardiomyopathy, CAD s/p CABG x 4 07/2020, s/p maze procedure with left atrial appendage ligation on Xarelto , asymptomatic bilateral carotid artery stenosis, history of NSVT, persistent atrial fibrillation, HTN, HLD, left bundle branch block.  Most recent echo 11/2022 with LVEF 60 to 65%, normal wall motion, RV systolic function mildly reduced, mild PASP 37 mmHg, no significant valvular abnormalities.  Last seen by Dr. Michele on 01/26/2024 and noted to be feeling well from cardiac standpoint.  Current medications were continued and he was recommended to follow-up in 1 year.  Patient seen in follow-up by vascular surgeon Dr. Vonzell on 07/10/2024 for reevaluation of asymptomatic carotid stenosis.  Recent imaging showed critical (80 to 99%) stenosis in the R ICA.  He has 60 to 79% stenosis in the LICA.  Right TCAR recommended.  Patient reports last dose of Xarelto  08/05/2024.  Preop labs reviewed, mild hyponatremia sodium 132, creatinine mildly elevated 1.38, mild anemia with hemoglobin 1.6.  EKG 01/26/2024: Atrial fibrillation.  Rate 65. Indeterminate axis. Non-specific intra-ventricular conduction block. Minimal voltage criteria for LVH, may be normal variant. Lateral infarct , age undetermined.  No significant change.  Carotid duplex 05/29/2024: Summary:  Right Carotid: Velocities in the right ICA are consistent with a 80-99%  stenosis.  Several spectral Doppler samples obtained. Only one sample obtained in the 80-99% stenosis category which may be slightly overestimated due to cardiac arrhytmia. All other samples taken reflect a 60-79% stenosis.  Left Carotid: Velocities in the left ICA are consistent with a 60-79% stenosis.  Vertebrals: Bilateral vertebral arteries demonstrate antegrade flow.  Subclavians: Normal flow hemodynamics were seen in bilateral subclavian arteries.   TTE 12/07/2022: 1. Left  ventricular ejection fraction, by estimation, is 60 to 65%. The  left ventricle has normal function. The left ventricle has no regional  wall motion abnormalities. Left ventricular diastolic function could not  be evaluated.   2. Right ventricular systolic function is mildly reduced. The right  ventricular size is normal. There is mildly elevated pulmonary artery  systolic pressure. The estimated right ventricular systolic pressure is  37.1 mmHg.   3. The mitral valve is degenerative. Trivial mitral valve regurgitation.  No evidence of mitral stenosis. Moderate mitral annular calcification.   4. The aortic valve is calcified. Aortic valve regurgitation is not  visualized. Aortic valve sclerosis is present, with no evidence of aortic  valve stenosis.   5. The inferior vena cava is normal in size with greater than 50%  respiratory variability, suggesting right atrial pressure of 3 mmHg.     [place], Bail [facility] Stay [facility]/Anesthesiology Phone [phone] 08/08/2024 9:53 AM

## 2024-08-09 ENCOUNTER — Encounter (HOSPITAL_COMMUNITY): Admission: RE | Disposition: A | Payer: Self-pay | Source: Home / Self Care | Attending: Vascular Surgery

## 2024-08-09 ENCOUNTER — Other Ambulatory Visit: Payer: Self-pay

## 2024-08-09 ENCOUNTER — Inpatient Hospital Stay (HOSPITAL_COMMUNITY)

## 2024-08-09 ENCOUNTER — Inpatient Hospital Stay (HOSPITAL_COMMUNITY): Admitting: Anesthesiology

## 2024-08-09 ENCOUNTER — Encounter (HOSPITAL_COMMUNITY): Payer: Self-pay | Admitting: Vascular Surgery

## 2024-08-09 ENCOUNTER — Inpatient Hospital Stay (HOSPITAL_COMMUNITY): Admitting: Physician Assistant

## 2024-08-09 ENCOUNTER — Inpatient Hospital Stay (HOSPITAL_COMMUNITY)
Admission: RE | Admit: 2024-08-09 | Discharge: 2024-08-10 | DRG: 035 | Disposition: A | Discharge status: Home / Self Care | Attending: Vascular Surgery | Admitting: Vascular Surgery

## 2024-08-09 DIAGNOSIS — E871 Hypo-osmolality and hyponatremia: Secondary | ICD-10-CM | POA: Diagnosis present

## 2024-08-09 DIAGNOSIS — I2510 Atherosclerotic heart disease of native coronary artery without angina pectoris: Secondary | ICD-10-CM | POA: Diagnosis present

## 2024-08-09 DIAGNOSIS — Z7901 Long term (current) use of anticoagulants: Secondary | ICD-10-CM | POA: Diagnosis not present

## 2024-08-09 DIAGNOSIS — Z951 Presence of aortocoronary bypass graft: Secondary | ICD-10-CM | POA: Diagnosis not present

## 2024-08-09 DIAGNOSIS — N182 Chronic kidney disease, stage 2 (mild): Secondary | ICD-10-CM | POA: Diagnosis present

## 2024-08-09 DIAGNOSIS — Z006 Encounter for examination for normal comparison and control in clinical research program: Secondary | ICD-10-CM | POA: Diagnosis not present

## 2024-08-09 DIAGNOSIS — I130 Hypertensive heart and chronic kidney disease with heart failure and stage 1 through stage 4 chronic kidney disease, or unspecified chronic kidney disease: Secondary | ICD-10-CM | POA: Diagnosis present

## 2024-08-09 DIAGNOSIS — Z9049 Acquired absence of other specified parts of digestive tract: Secondary | ICD-10-CM

## 2024-08-09 DIAGNOSIS — Z7902 Long term (current) use of antithrombotics/antiplatelets: Secondary | ICD-10-CM | POA: Diagnosis not present

## 2024-08-09 DIAGNOSIS — I447 Left bundle-branch block, unspecified: Secondary | ICD-10-CM | POA: Diagnosis present

## 2024-08-09 DIAGNOSIS — I5033 Acute on chronic diastolic (congestive) heart failure: Secondary | ICD-10-CM

## 2024-08-09 DIAGNOSIS — I6521 Occlusion and stenosis of right carotid artery: Principal | ICD-10-CM | POA: Diagnosis present

## 2024-08-09 DIAGNOSIS — Z8349 Family history of other endocrine, nutritional and metabolic diseases: Secondary | ICD-10-CM

## 2024-08-09 DIAGNOSIS — I272 Pulmonary hypertension, unspecified: Secondary | ICD-10-CM | POA: Diagnosis present

## 2024-08-09 DIAGNOSIS — I5032 Chronic diastolic (congestive) heart failure: Secondary | ICD-10-CM | POA: Diagnosis present

## 2024-08-09 DIAGNOSIS — Z8249 Family history of ischemic heart disease and other diseases of the circulatory system: Secondary | ICD-10-CM | POA: Diagnosis not present

## 2024-08-09 DIAGNOSIS — Z79899 Other long term (current) drug therapy: Secondary | ICD-10-CM | POA: Diagnosis not present

## 2024-08-09 DIAGNOSIS — I6523 Occlusion and stenosis of bilateral carotid arteries: Secondary | ICD-10-CM | POA: Diagnosis present

## 2024-08-09 DIAGNOSIS — E7800 Pure hypercholesterolemia, unspecified: Secondary | ICD-10-CM | POA: Diagnosis present

## 2024-08-09 DIAGNOSIS — Z7982 Long term (current) use of aspirin: Secondary | ICD-10-CM

## 2024-08-09 DIAGNOSIS — I4891 Unspecified atrial fibrillation: Secondary | ICD-10-CM | POA: Diagnosis present

## 2024-08-09 DIAGNOSIS — Z7984 Long term (current) use of oral hypoglycemic drugs: Secondary | ICD-10-CM

## 2024-08-09 LAB — CBC
HCT: 36.1 % — ABNORMAL LOW (ref 39.0–52.0)
Hemoglobin: 10.2 g/dL — ABNORMAL LOW (ref 13.0–17.0)
MCH: 21.1 pg — ABNORMAL LOW (ref 26.0–34.0)
MCHC: 28.3 g/dL — ABNORMAL LOW (ref 30.0–36.0)
MCV: 74.7 fL — ABNORMAL LOW (ref 80.0–100.0)
Platelets: 272 K/uL (ref 150–400)
RBC: 4.83 MIL/uL (ref 4.22–5.81)
RDW: 18.8 % — ABNORMAL HIGH (ref 11.5–15.5)
WBC: 5.1 K/uL (ref 4.0–10.5)
nRBC: 0 % (ref 0.0–0.2)

## 2024-08-09 LAB — CREATININE, SERUM
Creatinine, Ser: 1.43 mg/dL — ABNORMAL HIGH (ref 0.61–1.24)
GFR, Estimated: 52 mL/min — ABNORMAL LOW (ref >60)

## 2024-08-09 SURGERY — TRANSCAROTID ARTERY REVASCULARIZATION (TCAR)
Anesthesia: General | Laterality: Right

## 2024-08-09 MED ORDER — SODIUM CHLORIDE 0.9 % IV SOLN: 500.0000 mL | Freq: Once | INTRAVENOUS | Status: DC | PRN

## 2024-08-09 MED ORDER — HEPARIN SODIUM (PORCINE) 1000 UNIT/ML IJ SOLN
INTRAMUSCULAR | Status: AC
  Filled 2024-08-09: qty 10

## 2024-08-09 MED ORDER — SPIRONOLACTONE 25 MG PO TABS
25.0000 mg | ORAL_TABLET | Freq: Every day | ORAL | Status: DC
Start: 2024-08-09 — End: 2024-08-10
  Administered 2024-08-09 – 2024-08-10 (×2): 25 mg via ORAL
  Filled 2024-08-09 (×2): qty 1

## 2024-08-09 MED ORDER — POTASSIUM CHLORIDE CRYS ER 20 MEQ PO TBCR: 40.0000 meq | EXTENDED_RELEASE_TABLET | Freq: Every day | ORAL | Status: DC | PRN

## 2024-08-09 MED ORDER — EPHEDRINE 5 MG/ML INJ
INTRAVENOUS | Status: AC
  Filled 2024-08-09: qty 5

## 2024-08-09 MED ORDER — TRAMADOL HCL 50 MG PO TABS: 50.0000 mg | ORAL_TABLET | Freq: Four times a day (QID) | ORAL | Status: DC | PRN

## 2024-08-09 MED ORDER — EPHEDRINE SULFATE-NACL 50-0.9 MG/10ML-% IV SOSY
PREFILLED_SYRINGE | INTRAVENOUS | Status: DC | PRN
  Administered 2024-08-09 (×4): 5 mg via INTRAVENOUS

## 2024-08-09 MED ORDER — HYDROMORPHONE HCL 1 MG/ML IJ SOLN: 0.5000 mg | INTRAMUSCULAR | Status: DC | PRN

## 2024-08-09 MED ORDER — DEXAMETHASONE SODIUM PHOSPHATE 10 MG/ML IJ SOLN
INTRAMUSCULAR | Status: DC | PRN
  Administered 2024-08-09: 10 mg via INTRAVENOUS

## 2024-08-09 MED ORDER — CEFAZOLIN SODIUM-DEXTROSE 2-4 GM/100ML-% IV SOLN
2.0000 g | INTRAVENOUS | Status: AC
Start: 2024-08-09 — End: 2024-08-09
  Administered 2024-08-09: 2 g via INTRAVENOUS

## 2024-08-09 MED ORDER — LABETALOL HCL 5 MG/ML IV SOLN
INTRAVENOUS | Status: DC | PRN
  Administered 2024-08-09 (×2): 5 mg via INTRAVENOUS

## 2024-08-09 MED ORDER — LACTATED RINGERS IV SOLN
INTRAVENOUS | Status: DC | PRN
Start: 2024-08-09 — End: 2024-08-09

## 2024-08-09 MED ORDER — LIDOCAINE 2% (20 MG/ML) 5 ML SYRINGE
INTRAMUSCULAR | Status: AC
  Filled 2024-08-09: qty 5

## 2024-08-09 MED ORDER — FENTANYL CITRATE (PF) 100 MCG/2ML IJ SOLN: 25.0000 ug | INTRAMUSCULAR | Status: DC | PRN

## 2024-08-09 MED ORDER — HEPARIN 6000 UNIT IRRIGATION SOLUTION
Status: DC | PRN
  Administered 2024-08-09: 1

## 2024-08-09 MED ORDER — PROTAMINE SULFATE 10 MG/ML IV SOLN
INTRAVENOUS | Status: DC | PRN
  Administered 2024-08-09: 45 mg via INTRAVENOUS
  Administered 2024-08-09: 5 mg via INTRAVENOUS

## 2024-08-09 MED ORDER — DEXAMETHASONE SODIUM PHOSPHATE 10 MG/ML IJ SOLN
INTRAMUSCULAR | Status: AC
  Filled 2024-08-09: qty 1

## 2024-08-09 MED ORDER — ACETAMINOPHEN 650 MG RE SUPP: 325.0000 mg | RECTAL | Status: DC | PRN

## 2024-08-09 MED ORDER — ONDANSETRON HCL 4 MG/2ML IJ SOLN: 4.0000 mg | Freq: Once | INTRAMUSCULAR | Status: DC | PRN

## 2024-08-09 MED ORDER — ORAL CARE MOUTH RINSE: 15.0000 mL | Freq: Once | OROMUCOSAL | Status: DC

## 2024-08-09 MED ORDER — OXYCODONE HCL 5 MG PO TABS: 5.0000 mg | ORAL_TABLET | Freq: Once | ORAL | Status: DC | PRN

## 2024-08-09 MED ORDER — HEPARIN SODIUM (PORCINE) 5000 UNIT/ML IJ SOLN
5000.0000 [IU] | Freq: Three times a day (TID) | INTRAMUSCULAR | Status: DC
  Administered 2024-08-10: 5000 [IU] via SUBCUTANEOUS
  Filled 2024-08-09: qty 1

## 2024-08-09 MED ORDER — SODIUM CHLORIDE 0.9 % IV SOLN
INTRAVENOUS | Status: DC
Start: 2024-08-09 — End: 2024-08-09

## 2024-08-09 MED ORDER — SODIUM CHLORIDE 0.9% FLUSH
3.0000 mL | Freq: Two times a day (BID) | INTRAVENOUS | Status: DC
  Administered 2024-08-09 – 2024-08-10 (×2): 3 mL via INTRAVENOUS

## 2024-08-09 MED ORDER — PROPOFOL 10 MG/ML IV BOLUS
INTRAVENOUS | Status: DC | PRN
  Administered 2024-08-09: 120 mg via INTRAVENOUS

## 2024-08-09 MED ORDER — SUGAMMADEX SODIUM 200 MG/2ML IV SOLN
INTRAVENOUS | Status: DC | PRN
  Administered 2024-08-09: 200 mg via INTRAVENOUS

## 2024-08-09 MED ORDER — PHENYLEPHRINE HCL-NACL 20-0.9 MG/250ML-% IV SOLN
INTRAVENOUS | Status: DC | PRN
  Administered 2024-08-09: 30 ug/min via INTRAVENOUS

## 2024-08-09 MED ORDER — PHENYLEPHRINE 80 MCG/ML (10ML) SYRINGE FOR IV PUSH (FOR BLOOD PRESSURE SUPPORT)
PREFILLED_SYRINGE | INTRAVENOUS | Status: DC | PRN
  Administered 2024-08-09: 120 ug via INTRAVENOUS
  Administered 2024-08-09: 80 ug via INTRAVENOUS

## 2024-08-09 MED ORDER — PHENOL 1.4 % MT LIQD: 1.0000 | OROMUCOSAL | Status: DC | PRN

## 2024-08-09 MED ORDER — CLOPIDOGREL BISULFATE 75 MG PO TABS
75.0000 mg | ORAL_TABLET | Freq: Every day | ORAL | Status: DC
Start: 2024-08-10 — End: 2024-08-10
  Administered 2024-08-10: 75 mg via ORAL
  Filled 2024-08-09: qty 1

## 2024-08-09 MED ORDER — HYDRALAZINE HCL 20 MG/ML IJ SOLN: 5.0000 mg | INTRAMUSCULAR | Status: DC | PRN

## 2024-08-09 MED ORDER — SODIUM CHLORIDE 0.9% FLUSH: 3.0000 mL | INTRAVENOUS | Status: DC | PRN

## 2024-08-09 MED ORDER — CEFAZOLIN SODIUM-DEXTROSE 2-4 GM/100ML-% IV SOLN
2.0000 g | Freq: Three times a day (TID) | INTRAVENOUS | Status: AC
  Administered 2024-08-09 (×2): 2 g via INTRAVENOUS
  Filled 2024-08-09 (×2): qty 100

## 2024-08-09 MED ORDER — OXYCODONE HCL 5 MG/5ML PO SOLN: 5.0000 mg | Freq: Once | ORAL | Status: DC | PRN

## 2024-08-09 MED ORDER — DOCUSATE SODIUM 100 MG PO CAPS
100.0000 mg | ORAL_CAPSULE | Freq: Every day | ORAL | Status: DC
  Filled 2024-08-09: qty 1

## 2024-08-09 MED ORDER — LABETALOL HCL 5 MG/ML IV SOLN: 10.0000 mg | INTRAVENOUS | Status: DC | PRN

## 2024-08-09 MED ORDER — IODIXANOL 320 MG/ML IV SOLN
INTRAVENOUS | Status: DC | PRN
  Administered 2024-08-09: 50 mL via INTRA_ARTERIAL

## 2024-08-09 MED ORDER — SENNOSIDES-DOCUSATE SODIUM 8.6-50 MG PO TABS: 1.0000 | ORAL_TABLET | Freq: Every evening | ORAL | Status: DC | PRN

## 2024-08-09 MED ORDER — METOPROLOL TARTRATE 5 MG/5ML IV SOLN: 2.5000 mg | INTRAVENOUS | Status: DC | PRN

## 2024-08-09 MED ORDER — PHENYLEPHRINE 80 MCG/ML (10ML) SYRINGE FOR IV PUSH (FOR BLOOD PRESSURE SUPPORT)
PREFILLED_SYRINGE | INTRAVENOUS | Status: AC
  Filled 2024-08-09: qty 10

## 2024-08-09 MED ORDER — ACETAMINOPHEN 325 MG PO TABS: 325.0000 mg | ORAL_TABLET | ORAL | Status: DC | PRN

## 2024-08-09 MED ORDER — ROSUVASTATIN CALCIUM 20 MG PO TABS
20.0000 mg | ORAL_TABLET | Freq: Every day | ORAL | Status: DC
  Administered 2024-08-09: 20 mg via ORAL
  Filled 2024-08-09: qty 1

## 2024-08-09 MED ORDER — FENTANYL CITRATE (PF) 250 MCG/5ML IJ SOLN
INTRAMUSCULAR | Status: AC
  Filled 2024-08-09: qty 5

## 2024-08-09 MED ORDER — TORSEMIDE 20 MG PO TABS: 20.0000 mg | ORAL_TABLET | Freq: Every day | ORAL | Status: DC | PRN

## 2024-08-09 MED ORDER — ACETAMINOPHEN 10 MG/ML IV SOLN: 1000.0000 mg | Freq: Once | INTRAVENOUS | Status: DC | PRN

## 2024-08-09 MED ORDER — HEPARIN 6000 UNIT IRRIGATION SOLUTION
Status: AC
  Filled 2024-08-09: qty 500

## 2024-08-09 MED ORDER — LABETALOL HCL 5 MG/ML IV SOLN
INTRAVENOUS | Status: AC
  Filled 2024-08-09: qty 4

## 2024-08-09 MED ORDER — BISACODYL 5 MG PO TBEC: 5.0000 mg | DELAYED_RELEASE_TABLET | Freq: Every day | ORAL | Status: DC | PRN

## 2024-08-09 MED ORDER — ONDANSETRON HCL 4 MG/2ML IJ SOLN: 4.0000 mg | Freq: Four times a day (QID) | INTRAMUSCULAR | Status: DC | PRN

## 2024-08-09 MED ORDER — GLYCOPYRROLATE 0.2 MG/ML IJ SOLN
INTRAMUSCULAR | Status: DC | PRN
  Administered 2024-08-09 (×2): .2 mg via INTRAVENOUS

## 2024-08-09 MED ORDER — CHLORHEXIDINE GLUCONATE 0.12 % MT SOLN: 15.0000 mL | Freq: Once | OROMUCOSAL | Status: DC

## 2024-08-09 MED ORDER — PROPOFOL 10 MG/ML IV BOLUS
INTRAVENOUS | Status: AC
  Filled 2024-08-09: qty 20

## 2024-08-09 MED ORDER — CEFAZOLIN SODIUM-DEXTROSE 2-4 GM/100ML-% IV SOLN
INTRAVENOUS | Status: AC
  Filled 2024-08-09: qty 100

## 2024-08-09 MED ORDER — HEPARIN SODIUM (PORCINE) 1000 UNIT/ML IJ SOLN
INTRAMUSCULAR | Status: DC | PRN
  Administered 2024-08-09: 10000 [IU] via INTRAVENOUS

## 2024-08-09 MED ORDER — FENTANYL CITRATE (PF) 250 MCG/5ML IJ SOLN
INTRAMUSCULAR | Status: DC | PRN
  Administered 2024-08-09: 100 ug via INTRAVENOUS

## 2024-08-09 MED ORDER — CHLORHEXIDINE GLUCONATE CLOTH 2 % EX PADS: 6.0000 | MEDICATED_PAD | Freq: Once | CUTANEOUS | Status: DC

## 2024-08-09 MED ORDER — CLEVIDIPINE BUTYRATE 0.5 MG/ML IV EMUL
INTRAVENOUS | Status: DC | PRN
  Administered 2024-08-09: 1 mg/h via INTRAVENOUS

## 2024-08-09 MED ORDER — 0.9 % SODIUM CHLORIDE (POUR BTL) OPTIME
TOPICAL | Status: DC | PRN
  Administered 2024-08-09: 1000 mL

## 2024-08-09 MED ORDER — ASPIRIN 81 MG PO TBEC
81.0000 mg | DELAYED_RELEASE_TABLET | Freq: Every day | ORAL | Status: DC
  Administered 2024-08-10: 81 mg via ORAL
  Filled 2024-08-09: qty 1

## 2024-08-09 MED ORDER — SODIUM CHLORIDE 0.9 % IV SOLN: 250.0000 mL | INTRAVENOUS | Status: DC | PRN

## 2024-08-09 MED ORDER — ROCURONIUM BROMIDE 10 MG/ML (PF) SYRINGE
PREFILLED_SYRINGE | INTRAVENOUS | Status: DC | PRN
  Administered 2024-08-09: 80 mg via INTRAVENOUS

## 2024-08-09 MED ORDER — ONDANSETRON HCL 4 MG/2ML IJ SOLN
INTRAMUSCULAR | Status: AC
Start: 2024-08-09 — End: 2024-08-09
  Filled 2024-08-09: qty 2

## 2024-08-09 MED ORDER — ONDANSETRON HCL 4 MG/2ML IJ SOLN
INTRAMUSCULAR | Status: DC | PRN
  Administered 2024-08-09: 4 mg via INTRAVENOUS

## 2024-08-09 MED ORDER — ROCURONIUM BROMIDE 10 MG/ML (PF) SYRINGE
PREFILLED_SYRINGE | INTRAVENOUS | Status: AC
  Filled 2024-08-09: qty 10

## 2024-08-09 MED ORDER — HEMOSTATIC AGENTS (NO CHARGE) OPTIME
TOPICAL | Status: DC | PRN
Start: 2024-08-09 — End: 2024-08-09
  Administered 2024-08-09: 1 via TOPICAL

## 2024-08-09 MED ORDER — CHLORHEXIDINE GLUCONATE 0.12 % MT SOLN
OROMUCOSAL | Status: AC
  Filled 2024-08-09: qty 15

## 2024-08-09 MED ORDER — LACTATED RINGERS IV SOLN
INTRAVENOUS | Status: DC
Start: 2024-08-09 — End: 2024-08-09

## 2024-08-09 MED ORDER — LIDOCAINE 2% (20 MG/ML) 5 ML SYRINGE
INTRAMUSCULAR | Status: DC | PRN
  Administered 2024-08-09: 100 mg via INTRAVENOUS

## 2024-08-09 SURGICAL SUPPLY — 48 items
BAG BANDED W/RUBBER/TAPE 36X54 (MISCELLANEOUS) ×1 IMPLANT
BENZOIN TINCTURE PRP APPL 2/3 (GAUZE/BANDAGES/DRESSINGS) IMPLANT
CANISTER SUCTION 3000ML PPV (SUCTIONS) ×1 IMPLANT
CATH BALLN ENROUTE 6X35 (CATHETERS) IMPLANT
CHLORAPREP W/TINT 26 (MISCELLANEOUS) ×2 IMPLANT
CLIP LIGATING EXTRA MED SLVR (CLIP) IMPLANT
CLIP LIGATING EXTRA SM BLUE (MISCELLANEOUS) IMPLANT
COVER DOME SNAP 22 D (MISCELLANEOUS) ×1 IMPLANT
COVER PROBE W GEL 5X96 (DRAPES) ×1 IMPLANT
DERMABOND ADVANCED .7 DNX12 (GAUZE/BANDAGES/DRESSINGS) IMPLANT
DERMABOND ADVANCED .7 DNX6 (GAUZE/BANDAGES/DRESSINGS) ×1 IMPLANT
DRAPE FEMORAL ANGIO 80X135IN (DRAPES) ×1 IMPLANT
DRSG TEGADERM 4X4.75 (GAUZE/BANDAGES/DRESSINGS) IMPLANT
ELECTRODE REM PT RTRN 9FT ADLT (ELECTROSURGICAL) ×1 IMPLANT
GAUZE SPONGE 4X4 12PLY STRL (GAUZE/BANDAGES/DRESSINGS) ×1 IMPLANT
GLOVE BIO SURGEON STRL SZ8 (GLOVE) ×1 IMPLANT
GOWN STRL REUS W/ TWL LRG LVL3 (GOWN DISPOSABLE) ×2 IMPLANT
GOWN STRL REUS W/ TWL XL LVL3 (GOWN DISPOSABLE) ×1 IMPLANT
GUIDEWIRE ENROUTE 0.014 (WIRE) ×1 IMPLANT
HEMOSTAT SNOW SURGICEL 2X4 (HEMOSTASIS) IMPLANT
KIT BASIN OR (CUSTOM PROCEDURE TRAY) ×1 IMPLANT
KIT ENCORE 26 ADVANTAGE (KITS) ×1 IMPLANT
KIT INTRODUCER GALT 7 (INTRODUCER) ×1 IMPLANT
KIT TURNOVER KIT B (KITS) ×1 IMPLANT
NDL HYPO 25GX1X1/2 BEV (NEEDLE) IMPLANT
NEEDLE HYPO 25GX1X1/2 BEV (NEEDLE) IMPLANT
PACK CAROTID (CUSTOM PROCEDURE TRAY) ×1 IMPLANT
POSITIONER HEAD DONUT 9IN (MISCELLANEOUS) ×1 IMPLANT
SET MICROPUNCTURE 5F STIFF (MISCELLANEOUS) ×1 IMPLANT
SHEATH AVANTI 11CM 5FR (SHEATH) IMPLANT
SHUNT CAROTID BYPASS 10 (VASCULAR PRODUCTS) IMPLANT
SOLN 0.9% NACL 1000 ML (IV SOLUTION) ×1 IMPLANT
SOLN 0.9% NACL POUR BTL 1000ML (IV SOLUTION) ×1 IMPLANT
SOLN STERILE WATER 1000 ML (IV SOLUTION) ×1 IMPLANT
SOLN STERILE WATER BTL 1000 ML (IV SOLUTION) ×1 IMPLANT
STENT TRANSCAROTID SYS 10X40 (Permanent Stent) IMPLANT
STRIP CLOSURE SKIN 1/2X4 (GAUZE/BANDAGES/DRESSINGS) IMPLANT
SUT MNCRL AB 4-0 PS2 18 (SUTURE) ×1 IMPLANT
SUT PROLENE 5 0 C 1 24 (SUTURE) ×1 IMPLANT
SUT SILK 2 0 SH (SUTURE) ×1 IMPLANT
SUT VIC AB 3-0 SH 27X BRD (SUTURE) ×1 IMPLANT
SYR 10ML LL (SYRINGE) ×3 IMPLANT
SYR 20ML LL LF (SYRINGE) ×1 IMPLANT
SYR CONTROL 10ML LL (SYRINGE) IMPLANT
SYSTEM TRANSCAROTID NEUROPRTCT (MISCELLANEOUS) ×1 IMPLANT
TAPE UMBILICAL 1/8X30 (MISCELLANEOUS) IMPLANT
TOWEL GREEN STERILE (TOWEL DISPOSABLE) ×1 IMPLANT
WIRE BENTSON .035X145CM (WIRE) ×1 IMPLANT

## 2024-08-09 NOTE — Interval H&P Note (Signed)
 History and Physical Interval Note:  08/09/2024 7:23 AM  [name] Rice  has presented today for surgery, with the diagnosis of right carotid stenosis.  The various methods of treatment have been discussed with the patient and family. After consideration of risks, benefits and other options for treatment, the patient has consented to  Procedure(s): TRANSCAROTID ARTERY REVASCULARIZATION (TCAR) (Right) as a surgical intervention.  The patient's history has been reviewed, patient examined, no change in status, stable for surgery.  I have reviewed the patient's chart and labs.  Questions were answered to the patient's satisfaction.     Debby LOISE Robertson

## 2024-08-09 NOTE — Progress Notes (Signed)
 Notified by CCMD patient's heart rate drops to 30s and comes back up shortly after, does not sustain.

## 2024-08-09 NOTE — Anesthesia Procedure Notes (Addendum)
 Procedure Name: Intubation Date/Time: 08/09/2024 7:50 AM  Performed by: Vanice Search, RNPre-anesthesia Checklist: Patient identified, Emergency Drugs available, Suction available and Patient being monitored Patient Re-evaluated:Patient Re-evaluated prior to induction Oxygen Delivery Method: Circle system utilized Preoxygenation: Pre-oxygenation with 100% oxygen Induction Type: IV induction Ventilation: Oral airway inserted - appropriate to patient size and Two handed mask ventilation required Laryngoscope Size: Mac and 4 Grade View: Grade I Tube type: Oral Tube size: 7.0 mm Number of attempts: 1 Airway Equipment and Method: Stylet and Oral airway Placement Confirmation: ETT inserted through vocal cords under direct vision, positive ETCO2 and breath sounds checked- equal and bilateral Secured at: 22 cm Tube secured with: Tape Dental Injury: Teeth and Oropharynx as per pre-operative assessment

## 2024-08-09 NOTE — Transfer of Care (Signed)
 Immediate Anesthesia Transfer of Care Note  Patient: [name] Rice  Procedure(s) Performed: LERETHA ARTERY REVASCULARIZATION (TCAR) (Right)  Patient Location: PACU  Anesthesia Type:General  Level of Consciousness: awake and alert   Airway & Oxygen Therapy: Patient Spontanous Breathing and Patient connected to face mask oxygen  Post-op Assessment: Report given to RN, Post -op Vital signs reviewed and stable, and Patient moving all extremities X 4  Post vital signs: Reviewed and stable  Last Vitals:  Vitals Value Taken Time  BP 127/66 08/09/24 09:26  Temp    Pulse 61 08/09/24 09:30  Resp 14 08/09/24 09:30  SpO2 84 % 08/09/24 09:30  Vitals shown include unfiled device data.  Last Pain:  Vitals:   08/09/24 0547  TempSrc: Oral         Complications: No notable events documented.

## 2024-08-09 NOTE — Progress Notes (Signed)
  Progress Note    08/09/2024 2:38 PM * Day of Surgery *  Subjective:  no complaints. Waiting on food tray   Vitals:   08/09/24 1300 08/09/24 1315  BP: 134/65 (!) 146/56  Pulse: (!) 58 (!) 57  Resp: 15 14  Temp:  97.7 F (36.5 C)  SpO2: 98% 97%   Physical Exam: Cardiac:  regular Lungs:  non labored Incisions:  right neck incision with dressing in place, very minimal strike through bleeding. Soft without swelling or hematoma Extremities:  moving all extremities without deficits Neurologic: alert and oriented. Speech coherent, smile symmetric  CBC    Component Value Date/Time   WBC 5.7 08/07/2024 0930   RBC 5.42 08/07/2024 0930   HGB 11.6 (L) 08/07/2024 0930   HGB 10.0 (L) 01/31/2024 0934   HCT 41.4 08/07/2024 0930   HCT 35.3 (L) 01/31/2024 0934   PLT 329 08/07/2024 0930   PLT 262 12/13/2019 0936   MCV 76.4 (L) 08/07/2024 0930   MCV 93 12/13/2019 0936   MCH 21.4 (L) 08/07/2024 0930   MCHC 28.0 (L) 08/07/2024 0930   RDW 19.4 (H) 08/07/2024 0930   RDW 13.0 12/13/2019 0936   LYMPHSABS 0.9 12/05/2022 1811   MONOABS 0.9 12/05/2022 1811   EOSABS 0.2 12/05/2022 1811   BASOSABS 0.1 12/05/2022 1811    BMET    Component Value Date/Time   NA 132 (L) 08/07/2024 0930   NA 138 01/31/2024 0934   K 3.6 08/07/2024 0930   CL 100 08/07/2024 0930   CO2 22 08/07/2024 0930   GLUCOSE 75 08/07/2024 0930   BUN 18 08/07/2024 0930   BUN 22 01/31/2024 0934   CREATININE 1.38 (H) 08/07/2024 0930   CALCIUM  8.8 (L) 08/07/2024 0930   GFRNONAA 55 (L) 08/07/2024 0930   GFRAA 85 11/13/2020 1022    INR    Component Value Date/Time   INR 1.8 (H) 08/07/2024 0930     Intake/Output Summary (Last 24 hours) at 08/09/2024 1438 Last data filed at 08/09/2024 1045 Gross per 24 hour  Intake 500 ml  Output 275 ml  Net 225 ml     Assessment/Plan:  73 y.o. male is s/p right TCAR * Day of Surgery *   Neurologically intact Right neck incision is clean, dry and intact Waiting on food  tray, has not yet eaten VSS Anticipate discharge tomorrow if he continues to progress well over night   Teretha Damme, PA-C [facility] [phone] 08/09/2024 2:38 PM

## 2024-08-09 NOTE — Op Note (Signed)
 DATE OF SERVICE: 08/09/2024  PATIENT:  [name] Rice  73 y.o. male  PRE-OPERATIVE DIAGNOSIS:  Asymptomatic, critical right carotid artery stenosis  POST-OPERATIVE DIAGNOSIS:  Same  PROCEDURE:   Right transcarotid artery revascularization  SURGEON:  Surgeons and Role:    * Magda Debby SAILOR, MD - Primary  ASSISTANT: Curry Damme, PA-C  An experienced assistant was required given the complexity of this procedure and the standard of surgical care. My assistant helped with exposure through counter tension, suctioning, ligation and retraction to better visualize the surgical field.  My assistant expedited sewing during the case by following my sutures. Wherever I use the term we in the report, my assistant actively helped me with that portion of the procedure.  ANESTHESIA:   general  EBL: Minimal  BLOOD ADMINISTERED:none  DRAINS: none   LOCAL MEDICATIONS USED:  NONE  SPECIMEN: None  COUNTS: confirmed correct.  TOURNIQUET: None  PATIENT DISPOSITION:  PACU - hemodynamically stable.   Delay start of Pharmacological VTE agent (>24hrs) due to surgical blood loss or risk of bleeding: no  INDICATION FOR PROCEDURE: [name] is a 73 y.o. male with critical right carotid artery stenosis which is thankfully asymptomatic.  CT angiogram suggested high carotid artery disease until a TCAR was offered. We specifically discussed risk of stroke, cranial nerve injury, and bleeding. The patient understood and wished to proceed.  OPERATIVE FINDINGS: Severe carotid artery stenosis as anticipated on preintervention angiogram.  Good technical result from intervention with resolution of stenosis.  Patient awoke at neurologic baseline and operating room.  Exam reconfirmed in PACU.  DESCRIPTION OF PROCEDURE: After identification of the patient in the pre-operative holding area, the patient was transferred to the operating room. The patient was positioned supine on the operating room table.  Anesthesia was induced. The right neck and bilateral groins were prepped and draped in standard fashion. A surgical pause was performed confirming correct patient, procedure, and operative location.  Using intraoperative ultrasound the course of the right common carotid artery was mapped on the skin.  A transverse incision was made between the sternal and clavicular heads of the sternocleidomastoid muscle, below the omohyoid. Following longitudinal division of the carotid sheath the jugular vein was partially skeletonized and retracted medially. Once 3 cm of common carotid artery (CCA) were isolated, umbilical tape was placed around the proximal 1/3 of the CCA under direct vision. A 5-O Prolene suture was pre-placed in the anterior wall of the CCA, in a "U stitch" configuration, close to the clavicle to facilitate hemostasis upon removal of the arterial sheath at completion of the TCAR procedure.   The contralateral common femoral vein (CFV) was accessed under ultrasound guidance, using standard Seldinger and micropuncture access technique. The venous return sheath was advanced into the CFV over the 0.035" wire provided. Blood was aspirated from the flow line followed by flushing of the Venous Sheath with heparinized saline. The Venous Sheath was secured to the patient's skin with suture to maintain optimal position in the vessel. Heparin  was given to obtain a therapeutic activated clotting time >250 seconds prior to arterial access.   A 4-French non-stiffened ENHANCE Transcarotid / Peripheral Access set was used, puncturing the artery with the 21G needle through the pre-placed "U" stitch while holding gentle traction on the umbilical tape to stabilize and centralize the CCA within the incision. Careful attention was paid to the change in CCA shape when using the umbilical tape to control or lift the artery. The micropuncture wire was then  advanced 3-4 cm into the CCA and, the 21G needle was removed. The  micropuncture sheath was advanced 2-3 cm into the CCA and the wire and dilator were removed. Pulsatile backflow indicated correct positioning. The provided 0.035 J-tipped guidewire was inserted as close as possible to the bifurcation without engaging the lesion. After micropuncture sheath removal, the Transcarotid Arterial Sheath was advanced to the 2.5cm marker and the 0.035" wire and dilator were then removed. Arterial Sheath position was assessed under fluoroscopy in two projections to ensure that the sheath tip was oriented coaxially in the CCA. The Arterial Sheath was sutured to the patient with gentle forward tension. Blood was slowly aspirated followed by flushing with heparinized saline. No ingress of air bubbles through the passive hemostatic valve was observed. The stopcocks were closed. Traction applied to the CCA previously to facilitate access was gently released.  The Flow Controller was connected to the Transcarotid Arterial Sheath, prepared by passively allowing a column of arterial blood to fill the line and connected to the Venous Return Sheath. CCA inflow was occluded proximal to the arteriotomy with a vascular clamp to achieve active flow reversal. To confirm flow reversal, a saline bolus was delivered into the venous flow line on both "High" and "Low" flow settings of the Flow Controller. Angiograms were performed with slow injections of a small amount of contrast filling just past the lesion to minimize antegrade transmission of micro-bubbles.   Prior to lesion manipulation, heart rate (70bpm) and systolic BP (140-154mmHg) were managed upwards to optimize flow reversal and procedural neuroprotection. The lesion was crossed with an 0.014" ENROUTE guidewire and pre-dilation of the lesion was performed with a 6x64mm rapid exchange 0.014" compatible balloon catheter to 8 atmospheres for 10 seconds. Stenting was performed with an 10x56mm ENROUTE Transcarotid stent, sized appropriately to the  right CCA.    AP angiogram (gentle contrast injections) were performed to confirm stent placement and arterial wall stent apposition. At [facility] case completion, antegrade flow was restored by releasing the clamp on the CCA then closing the NPS stopcocks to the flow lines. The Transcarotid Arterial Sheath was removed and the pre-closure suture was tied. Heparin  reversal was employed. The Venous Return Sheath was removed and hemostasis was achieved with brief manual compression.   Doppler flow was confirmed to be normal across the access site. The neck incision was closed in layers using 3-O vicryl and 4-O monocryl. Clean bandage was applied to the neck.   Upon completion of the case instrument and sharps counts were confirmed correct. The patient was transferred to the PACU in good condition. I was present for all portions of the procedure.  FOLLOW UP PLAN: Assuming a normal postoperative course, I will see the patient in 4 weeks with carotid duplex.  Patient should continue dual antiplatelet therapy and statin therapy.  Debby SAILOR. Magda, MD [facility] Phone Number: [phone] 08/09/2024 5:42 PM

## 2024-08-09 NOTE — Anesthesia Postprocedure Evaluation (Signed)
 Anesthesia Post Note  Patient: [name] Rice  Procedure(s) Performed: LERETHA ARTERY REVASCULARIZATION (TCAR) (Right)     Patient location during evaluation: PACU Anesthesia Type: General Level of consciousness: awake and alert Pain management: pain level controlled Vital Signs Assessment: post-procedure vital signs reviewed and stable Respiratory status: spontaneous breathing, nonlabored ventilation, respiratory function stable and patient connected to nasal cannula oxygen Cardiovascular status: blood pressure returned to baseline and stable Postop Assessment: no apparent nausea or vomiting Anesthetic complications: no   There were no known notable events for this encounter.  Last Vitals:  Vitals:   08/09/24 1300 08/09/24 1315  BP: 134/65 (!) 146/56  Pulse: (!) 58 (!) 57  Resp: 15 14  Temp:  36.5 C  SpO2: 98% 97%    Last Pain:  Vitals:   08/09/24 1315  TempSrc:   PainSc: 0-No pain                 Lynwood MARLA Cornea

## 2024-08-09 NOTE — Anesthesia Procedure Notes (Signed)
 Arterial Line Insertion Start/End10/12/2023 6:50 AM, 08/09/2024 6:55 AM Performed by: Lockie Flesher, CRNA, CRNA  Patient location: Pre-op. Preanesthetic checklist: patient identified, IV checked, site marked, risks and benefits discussed, surgical consent, monitors and equipment checked, pre-op evaluation, timeout performed and anesthesia consent Lidocaine  1% used for infiltration Right, radial was placed Catheter size: 20 G Hand hygiene performed  and maximum sterile barriers used  Allen's test indicative of satisfactory collateral circulation Attempts: 1 Following insertion, dressing applied and Biopatch. Post procedure assessment: normal and unchanged  Patient tolerated the procedure well with no immediate complications.

## 2024-08-10 ENCOUNTER — Other Ambulatory Visit (HOSPITAL_COMMUNITY): Payer: Self-pay

## 2024-08-10 ENCOUNTER — Encounter (HOSPITAL_COMMUNITY): Payer: Self-pay | Admitting: Vascular Surgery

## 2024-08-10 DIAGNOSIS — Z95828 Presence of other vascular implants and grafts: Secondary | ICD-10-CM

## 2024-08-10 DIAGNOSIS — R001 Bradycardia, unspecified: Secondary | ICD-10-CM

## 2024-08-10 LAB — LIPID PANEL
Cholesterol: 76 mg/dL (ref 0–200)
HDL: 36 mg/dL — ABNORMAL LOW (ref >40)
LDL Cholesterol: 31 mg/dL (ref 0–99)
Total CHOL/HDL Ratio: 2.1 ratio
Triglycerides: 46 mg/dL (ref <150)
VLDL: 9 mg/dL (ref 0–40)

## 2024-08-10 LAB — CBC
HCT: 30 % — ABNORMAL LOW (ref 39.0–52.0)
Hemoglobin: 8.8 g/dL — ABNORMAL LOW (ref 13.0–17.0)
MCH: 21.4 pg — ABNORMAL LOW (ref 26.0–34.0)
MCHC: 29.3 g/dL — ABNORMAL LOW (ref 30.0–36.0)
MCV: 73 fL — ABNORMAL LOW (ref 80.0–100.0)
Platelets: 246 K/uL (ref 150–400)
RBC: 4.11 MIL/uL — ABNORMAL LOW (ref 4.22–5.81)
RDW: 18.7 % — ABNORMAL HIGH (ref 11.5–15.5)
WBC: 6.8 K/uL (ref 4.0–10.5)
nRBC: 0 % (ref 0.0–0.2)

## 2024-08-10 LAB — BASIC METABOLIC PANEL WITH GFR
Anion gap: 10 (ref 5–15)
BUN: 20 mg/dL (ref 8–23)
CO2: 21 mmol/L — ABNORMAL LOW (ref 22–32)
Calcium: 8.6 mg/dL — ABNORMAL LOW (ref 8.9–10.3)
Chloride: 102 mmol/L (ref 98–111)
Creatinine, Ser: 1.44 mg/dL — ABNORMAL HIGH (ref 0.61–1.24)
GFR, Estimated: 52 mL/min — ABNORMAL LOW (ref >60)
Glucose, Bld: 124 mg/dL — ABNORMAL HIGH (ref 70–99)
Potassium: 4.3 mmol/L (ref 3.5–5.1)
Sodium: 133 mmol/L — ABNORMAL LOW (ref 135–145)

## 2024-08-10 LAB — POCT ACTIVATED CLOTTING TIME: Activated Clotting Time: 268 s

## 2024-08-10 MED ORDER — TRAMADOL HCL 50 MG PO TABS: 50.0000 mg | ORAL_TABLET | Freq: Four times a day (QID) | ORAL | 0 refills | Status: DC | PRN

## 2024-08-10 NOTE — Progress Notes (Signed)
 Reviewed AVS, patient expressed understanding of medications, MD follow up reviewed.   See LDA for information on wounds at discharge. CCMD contacted and informed patients is being discharged.  Patient states all belongings brought to the hospital at time of admission are accounted for and packed to take home.  Patient informed and expressed understanding where to pick up discharge medications.  [facility] contacted to transport patient to Discharge lounge to wait for transportation home.

## 2024-08-10 NOTE — TOC Transition Note (Signed)
 Transition of Care (TOC) - Discharge Note Rayfield Gobble RN, BSN Inpatient Care Management Unit 4E- RN Case Manager See Treatment Team for direct phone #   Patient Details  Name: [name] Rice MRN: [id number] Date of Birth: 08/18/1951  Transition of Care [facility]) CM/SW Contact:  Gobble Rayfield Hurst, RN Phone Number: 08/10/2024, 10:09 AM   Clinical Narrative:    Pt  stable for transition home today, CM notified by [facility] liaison that [facility] office made referral for [facility] needs. Liaison has spoken with pt and pt has declined any HH follow up at this time.  No DME needs noted.   Pt has transportation home    Final next level of care: Home w Home Health Services Barriers to Discharge: No Barriers Identified   Patient Goals and CMS Choice Patient states their goals for this hospitalization and ongoing recovery are:: return home   Choice offered to / list presented to : Patient      Discharge Placement               Home        Discharge Plan and Services Additional resources added to the After Visit Summary for     Discharge Planning Services: CM Consult Post Acute Care Choice: Home Health          DME Arranged: N/A DME Agency: NA         HH Agency: [facility] Date HH Agency Contacted: 08/10/24 Time HH Agency Contacted: 1009 Representative spoke with at [facility] Agency: [place]  Social Drivers of Health (SDOH) Interventions SDOH Screenings   Food Insecurity: No Food Insecurity (12/06/2022)  Housing: Low Risk  (12/06/2022)  Transportation Needs: No Transportation Needs (12/06/2022)  Utilities: Not At Risk (12/06/2022)  Tobacco Use: Low Risk  (08/09/2024)     Readmission Risk Interventions    08/10/2024   10:09 AM  Readmission Risk Prevention Plan  Post Dischage Appt Complete  Medication Screening Complete  Transportation Screening Complete

## 2024-08-10 NOTE — Discharge Instructions (Signed)

## 2024-08-10 NOTE — Progress Notes (Addendum)
  Progress Note    08/10/2024 7:48 AM 1 Day Post-Op  Subjective:  no complaints. Tolerating diet. Has not ambulated yet   Vitals:   08/10/24 0037 08/10/24 0417  BP: 118/61 (!) 117/38  Pulse: 74 (!) 55  Resp: 20 15  Temp: (!) 97.5 F (36.4 C) 98 F (36.7 C)  SpO2: 100% 100%   Physical Exam: Cardiac:  irregular  Lungs:  non labored Incisions:  right neck incision is intact, steri strips in place. Soft without hematoma Extremities:  moving all extremities without deficits Neurologic: alert and oriented   CBC    Component Value Date/Time   WBC 6.8 08/10/2024 0500   RBC 4.11 (L) 08/10/2024 0500   HGB 8.8 (L) 08/10/2024 0500   HGB 10.0 (L) 01/31/2024 0934   HCT 30.0 (L) 08/10/2024 0500   HCT 35.3 (L) 01/31/2024 0934   PLT 246 08/10/2024 0500   PLT 262 12/13/2019 0936   MCV 73.0 (L) 08/10/2024 0500   MCV 93 12/13/2019 0936   MCH 21.4 (L) 08/10/2024 0500   MCHC 29.3 (L) 08/10/2024 0500   RDW 18.7 (H) 08/10/2024 0500   RDW 13.0 12/13/2019 0936   LYMPHSABS 0.9 12/05/2022 1811   MONOABS 0.9 12/05/2022 1811   EOSABS 0.2 12/05/2022 1811   BASOSABS 0.1 12/05/2022 1811    BMET    Component Value Date/Time   NA 133 (L) 08/10/2024 0500   NA 138 01/31/2024 0934   K 4.3 08/10/2024 0500   CL 102 08/10/2024 0500   CO2 21 (L) 08/10/2024 0500   GLUCOSE 124 (H) 08/10/2024 0500   BUN 20 08/10/2024 0500   BUN 22 01/31/2024 0934   CREATININE 1.44 (H) 08/10/2024 0500   CALCIUM  8.6 (L) 08/10/2024 0500   GFRNONAA 52 (L) 08/10/2024 0500   GFRAA 85 11/13/2020 1022    INR    Component Value Date/Time   INR 1.8 (H) 08/07/2024 0930     Intake/Output Summary (Last 24 hours) at 08/10/2024 0748 Last data filed at 08/10/2024 0420 Gross per 24 hour  Intake 503 ml  Output 675 ml  Net -172 ml     Assessment/Plan:  73 y.o. male is s/p right TCAR 1 Day Post-Op   Neurologically intact Hemodynamically stable Right neck incision is c/d/I, soft without hematoma Had some  bradycardia last evening but nothing sustained. Asymptomatic. Has Atrial fibrillation Has not yet ambulated Restart Xarelto  today Patient stable for discharge home today as long as tolerates ambulation Follow up will be arranged in 1 month with carotid duplex   Teretha Damme, PA-C [facility] [phone] 08/10/2024 7:48 AM  VASCULAR STAFF ADDENDUM: I have independently interviewed and examined the patient. I agree with the above.  Looks great after TCAR. Safe for discharge today. Follow-up with me in 4 weeks with carotid duplex.  Needs aspirin , Plavix , Xarelto  until he sees me in follow-up in about a month.  Debby SAILOR. Magda, MD [facility] Phone Number: [phone] 08/10/2024 11:06 AM  '

## 2024-08-10 NOTE — Progress Notes (Signed)
 NT offered to walk with patient in hallway, however, he refused at this time. Patient is walking independently in his room without difficulty.

## 2024-08-11 NOTE — Discharge Summary (Signed)
 Discharge Summary     [name] Rice [birth year] 73 y.o. male  [id number]  Admission Date: 08/09/2024  Discharge Date: 08/10/24  Physician: Dr. 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 is a 73 year old male who was brought in as an outpatient and underwent right transcarotid artery revascularization by Dr. Magda on 08/09/2024.  This was performed due to high-grade asymptomatic stenosis of the right ICA.  He tolerated the procedure well and was admitted to the hospital postoperatively.  He did not experience any neurological events during his hospitalization.  His right neck incision was well-appearing without hematoma.  He will continue aspirin , Plavix , Xarelto  for at least 1 month.  He will follow-up in the office at that time with a carotid duplex.  He was prescribed 1 to 2 days of narcotic pain medication for continued postoperative pain control.  He was discharged home in stable condition.   Recent Labs    08/10/24 0500  NA 133*  K 4.3  CL 102  CO2 21*  GLUCOSE 124*  BUN 20  CALCIUM  8.6*   Recent Labs    08/09/24 1420 08/10/24 0500  WBC 5.1 6.8  HGB 10.2* 8.8*  HCT 36.1* 30.0*  PLT 272 246   No results for input(s): INR in the last 72 hours.   Discharge Instructions     CAROTID Sugery: Call MD for difficulty swallowing or speaking; weakness in arms or legs that is a new symtom; severe headache.  If you have increased swelling in the neck and/or  are having difficulty breathing, CALL 911   Complete by: As directed    Call MD for:  redness, tenderness, or signs of infection (pain, swelling, bleeding, redness, odor or green/yellow discharge around incision site)   Complete by: As directed    Call MD for:  severe or increased pain, loss or decreased feeling  in  affected limb(s)   Complete by: As directed    Call MD for:  temperature >100.5   Complete by: As directed    Discharge instructions   Complete by: As directed    You may resume your home Xarelto  on discharge. Please notify office if you notice any bleeding, swelling or excessive bruising around your neck incision site   Discharge wound care:   Complete by: As directed    Keep incision dry for 24 hours. You can then wash with mild soap and water , pat dry. 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] Carotid stenosis, right [I65.21] Carotid stenosis, asymptomatic, right [I65.21]  Secondary Diagnosis: Patient Active Problem List   Diagnosis Date Noted   Carotid stenosis, right 08/09/2024   Carotid stenosis, asymptomatic, right 08/09/2024   CKD (chronic kidney disease) stage 2, GFR 60-89 ml/min 12/07/2022   Symptomatic bradycardia 12/06/2022   Acute on chronic heart failure with  preserved ejection fraction (HFpEF) (HCC) 12/06/2022   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   Hx of maze procedure 12/06/2022   Asymptomatic bilateral carotid artery stenosis 12/06/2022   Bradycardia 12/06/2022   Acute on chronic diastolic heart failure (HCC) 12/05/2022   Pleural effusion, left 06/22/2021   Dyspnea    Pleural effusion    S/P robot-assisted surgical procedure    Budding yeast in U/A detected 09/24/2020   Generalized weakness 09/24/2020   Nausea and vomiting 09/23/2020   Right sided hydronephrosis with renal and ureteral calculus obstruction 09/23/2020   Essential hypertension    Abnormal  magnetic resonance cholangiopancreatography (MRCP)    Common bile duct (CBD) obstruction (HCC)    Elevated LFTs    Choledocholithiasis with acute cholecystitis    AKI (acute kidney injury) 09/15/2020   Sepsis (HCC) 09/15/2020   Abdominal pain 09/15/2020   Abnormal CT of the abdomen    Abnormal liver enzymes    Acute cholecystitis 09/14/2020   Malnutrition of moderate degree 08/07/2020   Acute blood loss anemia 08/07/2020   CAD, multiple vessel 08/07/2020   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   Postural dizziness with presyncope 07/24/2020   Hypokalemia 07/24/2020   Diarrhea 07/24/2020   Lightheadedness 07/24/2020   Permanent atrial fibrillation (HCC)    Long term (current) use of anticoagulants 01/14/2020   Benign hypertension with CKD (chronic kidney disease) stage III 06/12/2019   Pure hypercholesterolemia 06/12/2019   Atrial fibrillation, chronic (HCC) 09/25/2016   Past Medical History:  Diagnosis Date   Atrial fibrillation (HCC)    Carotid artery stenosis    Chronic diastolic (congestive) heart failure (HCC)    Coronary artery disease    Dysrhythmia    Hyperlipidemia    Hypertension    LBBB (left bundle branch block)     Allergies as of 08/10/2024   No Known Allergies      Medication List     TAKE these medications    Aspirin  81 MG Caps Take 81 mg by mouth daily.   clopidogrel  75 MG tablet Commonly known as: PLAVIX  Take 1 tablet (75 mg total) by mouth daily.   Entresto  49-51 MG Generic drug: sacubitril -valsartan  Take 1 tablet by mouth 2 (two) times daily.   Farxiga  10 MG Tabs tablet Generic drug: dapagliflozin  propanediol Take 1 tablet (10 mg total) by mouth daily before breakfast.   rosuvastatin  20 MG tablet Commonly known as: CRESTOR  TAKE 1 TABLET BY MOUTH AT  BEDTIME   spironolactone  25 MG tablet Commonly known as: ALDACTONE  TAKE 1 TABLET BY MOUTH DAILY   torsemide  20 MG tablet Commonly known as: DEMADEX  Take 1 tablet (20 mg total) by mouth daily  as needed (weight gain or fluid retention).   traMADol  50 MG tablet Commonly known as: ULTRAM  Take 1 tablet (50 mg total) by mouth every 6 (six) hours as needed for moderate pain (pain score 4-6).   Xarelto  20 MG Tabs tablet Generic drug: rivaroxaban  TAKE 1 TABLET BY MOUTH DAILY  WITH SUPPER               Discharge Care Instructions  (From admission, onward)           Start     Ordered   08/10/24 0000  Discharge wound care:       Comments: Keep incision dry for 24 hours. You can then wash with mild soap and water , pat dry. Do not soak in bathtub, pool, etc   08/10/24 0930  Discharge Instructions:   [facility] Discharge Instructions Carotid Endarterectomy (CEA)  Please refer to the following instructions for your post-procedure care. Your surgeon or physician assistant will discuss any changes with you.  Activity  You are encouraged to walk as much as you can. You can slowly return to normal activities but must avoid strenuous activity and heavy lifting until your doctor tell you it's OK. Avoid activities such as vacuuming or swinging a golf club. You can drive after one week if you are comfortable and you are no longer taking prescription pain medications. It is normal to feel tired for serval weeks after your surgery. It is also normal to have difficulty with sleep habits, eating, and bowel movements after surgery. These will go away with time.  Bathing/Showering  You may shower after you come home. Do not soak in a bathtub, hot tub, or swim until the incision heals completely.  Incision Care  Shower every day. Clean your incision with mild soap and water . Pat the area dry with a clean towel. You do not need a bandage unless otherwise instructed. Do not apply any ointments or creams to your incision. You may have skin glue on your incision. Do not peel it off. It will come off on its own in about one week. Your  incision may feel thickened and raised for several weeks after your surgery. This is normal and the skin will soften over time. For Men Only: It's OK to shave around the incision but do not shave the incision itself for 2 weeks. It is common to have numbness under your chin that could last for several months.  Diet  Resume your normal diet. There are no special food restrictions following this procedure. A low fat/low cholesterol diet is recommended for all patients with vascular disease. In order to heal from your surgery, it is CRITICAL to get adequate nutrition. Your body requires vitamins, minerals, and protein. Vegetables are the best source of vitamins and minerals. Vegetables also provide the perfect balance of protein. Processed food has little nutritional value, so try to avoid this.  Medications  Resume taking all of your medications unless your doctor or physician assistant tells you not to.  If your incision is causing pain, you may take over-the- counter pain relievers such as acetaminophen  (Tylenol ). If you were prescribed a stronger pain medication, please be aware these medications can cause nausea and constipation.  Prevent nausea by taking the medication with a snack or meal. Avoid constipation by drinking plenty of fluids and eating foods with a high amount of fiber, such as fruits, vegetables, and grains. Do not take Tylenol  if you are taking prescription pain medications.  Follow Up  Our office will schedule a follow up appointment 2-3 weeks following discharge.  Please call us  immediately for any of the following conditions  Increased pain, redness, drainage (pus) from your incision site. Fever of 101 degrees or higher. If you should develop stroke (slurred speech, difficulty swallowing, weakness on one side of your body, loss of vision) you should call 911 and go to the nearest emergency room.  Reduce your risk of vascular disease:  Stop smoking. If you would like help  call [facility] at 1-800-QUIT-NOW ((732)342-1414) or [place] at 780-774-2782. Manage your cholesterol Maintain a desired weight Control your diabetes Keep your blood pressure down  If you have any questions, please call the office at [phone].  Disposition: home  Patient's condition: is Good  Follow up:  1. [facility] in 4 weeks.   Donnice Sender, PA-C [facility] [phone]   --- For [facility] Registry use ---   Modified Rankin score at D/C (0-6): 0  IV medication needed for:  1. Hypertension: No 2. Hypotension: No  Post-op Complications: No  1. Post-op CVA or TIA: No  If yes: Event classification (right eye, left eye, right cortical, left cortical, verterobasilar, other):   If yes: Timing of event (intra-op, <6 hrs post-op, >=6 hrs post-op, unknown):   2. CN injury: No  If yes: CN  injuried   3. Myocardial infarction: No  If yes: Dx by (EKG or clinical, Troponin):   4.  CHF: No  5.  Dysrhythmia (new): No  6. Wound infection: No  7. Reperfusion symptoms: No  8. Return to OR: No  If yes: return to OR for (bleeding, neurologic, other CEA incision, other):   Discharge medications: Statin use:  Yes ASA use:  Yes   Beta blocker use:  No ACE-Inhibitor use:  No  ARB use:  Yes CCB use: No P2Y12 Antagonist use: Yes, [ ]  Plavix , [ ]  Plasugrel, [ ]  Ticlopinine, [ ]  Ticagrelor, [ ]  Other, [ ]  No for medical reason, [ ]  Non-compliant, [ ]  Not-indicated Anti-coagulant use:  Yes, [ ]  Warfarin, [ ]  Rivaroxaban , [ ]  Dabigatran,

## 2024-08-19 ENCOUNTER — Emergency Department (HOSPITAL_COMMUNITY): Admitting: Certified Registered Nurse Anesthetist

## 2024-08-19 ENCOUNTER — Emergency Department (HOSPITAL_BASED_OUTPATIENT_CLINIC_OR_DEPARTMENT_OTHER)

## 2024-08-19 ENCOUNTER — Other Ambulatory Visit: Payer: Self-pay

## 2024-08-19 ENCOUNTER — Encounter (HOSPITAL_BASED_OUTPATIENT_CLINIC_OR_DEPARTMENT_OTHER): Payer: Self-pay | Admitting: Urology

## 2024-08-19 ENCOUNTER — Inpatient Hospital Stay (HOSPITAL_BASED_OUTPATIENT_CLINIC_OR_DEPARTMENT_OTHER)
Admission: EM | Admit: 2024-08-19 | Discharge: 2024-08-20 | DRG: 253 | Disposition: A | Discharge status: Home / Self Care | Attending: Surgery | Admitting: Surgery

## 2024-08-19 ENCOUNTER — Encounter (HOSPITAL_COMMUNITY): Admission: EM | Disposition: A | Payer: Self-pay | Source: Home / Self Care | Attending: Surgery

## 2024-08-19 DIAGNOSIS — D6832 Hemorrhagic disorder due to extrinsic circulating anticoagulants: Secondary | ICD-10-CM | POA: Diagnosis present

## 2024-08-19 DIAGNOSIS — I130 Hypertensive heart and chronic kidney disease with heart failure and stage 1 through stage 4 chronic kidney disease, or unspecified chronic kidney disease: Secondary | ICD-10-CM

## 2024-08-19 DIAGNOSIS — Y838 Other surgical procedures as the cause of abnormal reaction of the patient, or of later complication, without mention of misadventure at the time of the procedure: Secondary | ICD-10-CM | POA: Diagnosis present

## 2024-08-19 DIAGNOSIS — I4891 Unspecified atrial fibrillation: Secondary | ICD-10-CM | POA: Diagnosis present

## 2024-08-19 DIAGNOSIS — T81718A Complication of other artery following a procedure, not elsewhere classified, initial encounter: Secondary | ICD-10-CM | POA: Diagnosis present

## 2024-08-19 DIAGNOSIS — I739 Peripheral vascular disease, unspecified: Secondary | ICD-10-CM | POA: Diagnosis present

## 2024-08-19 DIAGNOSIS — Z7901 Long term (current) use of anticoagulants: Secondary | ICD-10-CM

## 2024-08-19 DIAGNOSIS — I5032 Chronic diastolic (congestive) heart failure: Secondary | ICD-10-CM | POA: Diagnosis present

## 2024-08-19 DIAGNOSIS — Z8249 Family history of ischemic heart disease and other diseases of the circulatory system: Secondary | ICD-10-CM | POA: Diagnosis not present

## 2024-08-19 DIAGNOSIS — T45515A Adverse effect of anticoagulants, initial encounter: Secondary | ICD-10-CM | POA: Diagnosis present

## 2024-08-19 DIAGNOSIS — Z7902 Long term (current) use of antithrombotics/antiplatelets: Secondary | ICD-10-CM | POA: Diagnosis not present

## 2024-08-19 DIAGNOSIS — I721 Aneurysm of artery of upper extremity: Secondary | ICD-10-CM | POA: Diagnosis present

## 2024-08-19 DIAGNOSIS — I509 Heart failure, unspecified: Secondary | ICD-10-CM

## 2024-08-19 DIAGNOSIS — Z83438 Family history of other disorder of lipoprotein metabolism and other lipidemia: Secondary | ICD-10-CM | POA: Diagnosis not present

## 2024-08-19 DIAGNOSIS — S55101A Unspecified injury of radial artery at forearm level, right arm, initial encounter: Principal | ICD-10-CM

## 2024-08-19 DIAGNOSIS — I2510 Atherosclerotic heart disease of native coronary artery without angina pectoris: Secondary | ICD-10-CM | POA: Diagnosis present

## 2024-08-19 DIAGNOSIS — I5033 Acute on chronic diastolic (congestive) heart failure: Secondary | ICD-10-CM

## 2024-08-19 DIAGNOSIS — I119 Hypertensive heart disease without heart failure: Secondary | ICD-10-CM

## 2024-08-19 DIAGNOSIS — I9789 Other postprocedural complications and disorders of the circulatory system, not elsewhere classified: Secondary | ICD-10-CM

## 2024-08-19 DIAGNOSIS — Z79899 Other long term (current) drug therapy: Secondary | ICD-10-CM

## 2024-08-19 DIAGNOSIS — E7800 Pure hypercholesterolemia, unspecified: Secondary | ICD-10-CM | POA: Diagnosis present

## 2024-08-19 DIAGNOSIS — I729 Aneurysm of unspecified site: Secondary | ICD-10-CM | POA: Diagnosis present

## 2024-08-19 DIAGNOSIS — Z7982 Long term (current) use of aspirin: Secondary | ICD-10-CM | POA: Diagnosis not present

## 2024-08-19 DIAGNOSIS — I110 Hypertensive heart disease with heart failure: Secondary | ICD-10-CM | POA: Diagnosis present

## 2024-08-19 LAB — COMPREHENSIVE METABOLIC PANEL WITH GFR
ALT: 8 U/L (ref 0–44)
ALT: 11 U/L (ref 0–44)
AST: 18 U/L (ref 15–41)
AST: 20 U/L (ref 15–41)
Albumin: 3.8 g/dL (ref 3.5–5.0)
Albumin: 3.3 g/dL — ABNORMAL LOW (ref 3.5–5.0)
Alkaline Phosphatase: 84 U/L (ref 38–126)
Alkaline Phosphatase: 67 U/L (ref 38–126)
Anion gap: 11 (ref 5–15)
Anion gap: 13 (ref 5–15)
BUN: 16 mg/dL (ref 8–23)
BUN: 15 mg/dL (ref 8–23)
CO2: 22 mmol/L (ref 22–32)
CO2: 17 mmol/L — ABNORMAL LOW (ref 22–32)
Calcium: 8.9 mg/dL (ref 8.9–10.3)
Calcium: 8.4 mg/dL — ABNORMAL LOW (ref 8.9–10.3)
Chloride: 100 mmol/L (ref 98–111)
Chloride: 101 mmol/L (ref 98–111)
Creatinine, Ser: 1.18 mg/dL (ref 0.61–1.24)
Creatinine, Ser: 1.31 mg/dL — ABNORMAL HIGH (ref 0.61–1.24)
GFR, Estimated: >60 mL/min (ref >60)
GFR, Estimated: 58 mL/min — ABNORMAL LOW (ref >60)
Glucose, Bld: 102 mg/dL — ABNORMAL HIGH (ref 70–99)
Glucose, Bld: 235 mg/dL — ABNORMAL HIGH (ref 70–99)
Potassium: 3.9 mmol/L (ref 3.5–5.1)
Potassium: 4.4 mmol/L (ref 3.5–5.1)
Sodium: 134 mmol/L — ABNORMAL LOW (ref 135–145)
Sodium: 131 mmol/L — ABNORMAL LOW (ref 135–145)
Total Bilirubin: 1.4 mg/dL — ABNORMAL HIGH (ref 0.0–1.2)
Total Bilirubin: 1.6 mg/dL — ABNORMAL HIGH (ref 0.0–1.2)
Total Protein: 6.7 g/dL (ref 6.5–8.1)
Total Protein: 6.3 g/dL — ABNORMAL LOW (ref 6.5–8.1)

## 2024-08-19 LAB — CBC
HCT: 32.3 % — ABNORMAL LOW (ref 39.0–52.0)
Hemoglobin: 9.4 g/dL — ABNORMAL LOW (ref 13.0–17.0)
MCH: 21.8 pg — ABNORMAL LOW (ref 26.0–34.0)
MCHC: 29.1 g/dL — ABNORMAL LOW (ref 30.0–36.0)
MCV: 74.8 fL — ABNORMAL LOW (ref 80.0–100.0)
Platelets: 241 K/uL (ref 150–400)
RBC: 4.32 MIL/uL (ref 4.22–5.81)
RDW: 19.8 % — ABNORMAL HIGH (ref 11.5–15.5)
WBC: 5.3 K/uL (ref 4.0–10.5)
nRBC: 0 % (ref 0.0–0.2)

## 2024-08-19 LAB — CBC WITH DIFFERENTIAL/PLATELET
Abs Immature Granulocytes: 0.02 K/uL (ref 0.00–0.07)
Basophils Absolute: 0.1 K/uL (ref 0.0–0.1)
Basophils Relative: 1 %
Eosinophils Absolute: 0.2 K/uL (ref 0.0–0.5)
Eosinophils Relative: 3 %
HCT: 33.9 % — ABNORMAL LOW (ref 39.0–52.0)
Hemoglobin: 9.9 g/dL — ABNORMAL LOW (ref 13.0–17.0)
Immature Granulocytes: 0 %
Lymphocytes Relative: 18 %
Lymphs Abs: 1.1 K/uL (ref 0.7–4.0)
MCH: 21.8 pg — ABNORMAL LOW (ref 26.0–34.0)
MCHC: 29.2 g/dL — ABNORMAL LOW (ref 30.0–36.0)
MCV: 74.5 fL — ABNORMAL LOW (ref 80.0–100.0)
Monocytes Absolute: 0.9 K/uL (ref 0.1–1.0)
Monocytes Relative: 15 %
Neutro Abs: 3.7 K/uL (ref 1.7–7.7)
Neutrophils Relative %: 63 %
Platelets: 254 K/uL (ref 150–400)
RBC: 4.55 MIL/uL (ref 4.22–5.81)
RDW: 19.7 % — ABNORMAL HIGH (ref 11.5–15.5)
WBC: 5.9 K/uL (ref 4.0–10.5)
nRBC: 0 % (ref 0.0–0.2)

## 2024-08-19 LAB — PRO BRAIN NATRIURETIC PEPTIDE: Pro Brain Natriuretic Peptide: 2816 pg/mL — ABNORMAL HIGH (ref <300.0)

## 2024-08-19 LAB — PROTIME-INR
INR: 1.9 — ABNORMAL HIGH (ref 0.8–1.2)
Prothrombin Time: 22.6 s — ABNORMAL HIGH (ref 11.4–15.2)

## 2024-08-19 LAB — TROPONIN T, HIGH SENSITIVITY: Troponin T High Sensitivity: 40 ng/L — ABNORMAL HIGH (ref 0–19)

## 2024-08-19 SURGERY — REPAIR, PSEUDOANEURYSM
Anesthesia: General | Site: Arm Lower | Laterality: Right

## 2024-08-19 MED ORDER — ACETAMINOPHEN 10 MG/ML IV SOLN
INTRAVENOUS | Status: AC
Start: 2024-08-19 — End: 2024-08-19
  Filled 2024-08-19: qty 100

## 2024-08-19 MED ORDER — MORPHINE SULFATE (PF) 2 MG/ML IV SOLN: 2.0000 mg | INTRAVENOUS | Status: DC | PRN

## 2024-08-19 MED ORDER — ALUM & MAG HYDROXIDE-SIMETH 200-200-20 MG/5ML PO SUSP: 15.0000 mL | ORAL | Status: DC | PRN

## 2024-08-19 MED ORDER — CEFAZOLIN SODIUM-DEXTROSE 2-4 GM/100ML-% IV SOLN
INTRAVENOUS | Status: AC
Start: 2024-08-19 — End: 2024-08-19
  Filled 2024-08-19: qty 100

## 2024-08-19 MED ORDER — 0.9 % SODIUM CHLORIDE (POUR BTL) OPTIME
TOPICAL | Status: DC | PRN
  Administered 2024-08-19: 1000 mL

## 2024-08-19 MED ORDER — ALBUMIN HUMAN 5 % IV SOLN
INTRAVENOUS | Status: DC | PRN
Start: 2024-08-19 — End: 2024-08-19

## 2024-08-19 MED ORDER — HEPARIN 6000 UNIT IRRIGATION SOLUTION
Status: AC
  Filled 2024-08-19: qty 500

## 2024-08-19 MED ORDER — HYDRALAZINE HCL 20 MG/ML IJ SOLN: 5.0000 mg | INTRAMUSCULAR | Status: DC | PRN

## 2024-08-19 MED ORDER — FENTANYL CITRATE (PF) 250 MCG/5ML IJ SOLN
INTRAMUSCULAR | Status: DC | PRN
  Administered 2024-08-19: 100 ug via INTRAVENOUS

## 2024-08-19 MED ORDER — GUAIFENESIN-DM 100-10 MG/5ML PO SYRP: 15.0000 mL | ORAL_SOLUTION | ORAL | Status: DC | PRN

## 2024-08-19 MED ORDER — OXYCODONE HCL 5 MG PO TABS
5.0000 mg | ORAL_TABLET | ORAL | Status: DC | PRN
  Administered 2024-08-19: 10 mg via ORAL
  Administered 2024-08-19: 5 mg via ORAL
  Filled 2024-08-19: qty 2
  Filled 2024-08-19: qty 1

## 2024-08-19 MED ORDER — SENNA 8.6 MG PO TABS
1.0000 | ORAL_TABLET | Freq: Two times a day (BID) | ORAL | Status: DC
  Filled 2024-08-19 (×2): qty 1

## 2024-08-19 MED ORDER — ASPIRIN 81 MG PO CAPS: 81.0000 mg | ORAL_CAPSULE | Freq: Every day | ORAL | Status: DC

## 2024-08-19 MED ORDER — DAPAGLIFLOZIN PROPANEDIOL 10 MG PO TABS
10.0000 mg | ORAL_TABLET | Freq: Every day | ORAL | Status: DC
  Administered 2024-08-20: 10 mg via ORAL
  Filled 2024-08-19: qty 1

## 2024-08-19 MED ORDER — DEXAMETHASONE SOD PHOSPHATE PF 10 MG/ML IJ SOLN
INTRAMUSCULAR | Status: DC | PRN
  Administered 2024-08-19: 10 mg via INTRAVENOUS

## 2024-08-19 MED ORDER — ASPIRIN 81 MG PO TBEC
81.0000 mg | DELAYED_RELEASE_TABLET | Freq: Every day | ORAL | Status: DC
  Administered 2024-08-19 – 2024-08-20 (×2): 81 mg via ORAL
  Filled 2024-08-19 (×2): qty 1

## 2024-08-19 MED ORDER — HEPARIN 6000 UNIT IRRIGATION SOLUTION
Status: DC | PRN
  Administered 2024-08-19: 1

## 2024-08-19 MED ORDER — ROSUVASTATIN CALCIUM 20 MG PO TABS
20.0000 mg | ORAL_TABLET | Freq: Every day | ORAL | Status: DC
  Administered 2024-08-19: 20 mg via ORAL
  Filled 2024-08-19: qty 1

## 2024-08-19 MED ORDER — METOPROLOL TARTRATE 5 MG/5ML IV SOLN: 2.0000 mg | INTRAVENOUS | Status: DC | PRN

## 2024-08-19 MED ORDER — PHENYLEPHRINE HCL-NACL 20-0.9 MG/250ML-% IV SOLN
INTRAVENOUS | Status: DC | PRN
  Administered 2024-08-19: 25 ug/min via INTRAVENOUS

## 2024-08-19 MED ORDER — SPIRONOLACTONE 25 MG PO TABS
25.0000 mg | ORAL_TABLET | Freq: Every day | ORAL | Status: DC
  Administered 2024-08-19 – 2024-08-20 (×2): 25 mg via ORAL
  Filled 2024-08-19 (×2): qty 1

## 2024-08-19 MED ORDER — TORSEMIDE 20 MG PO TABS: 20.0000 mg | ORAL_TABLET | Freq: Every day | ORAL | Status: DC | PRN

## 2024-08-19 MED ORDER — PHENOL 1.4 % MT LIQD: 1.0000 | OROMUCOSAL | Status: DC | PRN

## 2024-08-19 MED ORDER — LACTATED RINGERS IV SOLN: INTRAVENOUS | Status: DC | PRN

## 2024-08-19 MED ORDER — ONDANSETRON HCL 4 MG/2ML IJ SOLN: 4.0000 mg | Freq: Four times a day (QID) | INTRAMUSCULAR | Status: DC | PRN

## 2024-08-19 MED ORDER — SUCCINYLCHOLINE CHLORIDE 200 MG/10ML IV SOSY
PREFILLED_SYRINGE | INTRAVENOUS | Status: DC | PRN
  Administered 2024-08-19: 140 mg via INTRAVENOUS

## 2024-08-19 MED ORDER — FENTANYL CITRATE (PF) 100 MCG/2ML IJ SOLN
INTRAMUSCULAR | Status: AC
  Filled 2024-08-19: qty 2

## 2024-08-19 MED ORDER — EPHEDRINE SULFATE-NACL 50-0.9 MG/10ML-% IV SOSY
PREFILLED_SYRINGE | INTRAVENOUS | Status: DC | PRN
Start: 2024-08-19 — End: 2024-08-19
  Administered 2024-08-19 (×3): 5 mg via INTRAVENOUS

## 2024-08-19 MED ORDER — LABETALOL HCL 5 MG/ML IV SOLN: 10.0000 mg | INTRAVENOUS | Status: DC | PRN

## 2024-08-19 MED ORDER — SACUBITRIL-VALSARTAN 49-51 MG PO TABS
1.0000 | ORAL_TABLET | Freq: Two times a day (BID) | ORAL | Status: DC
  Administered 2024-08-19 – 2024-08-20 (×2): 1 via ORAL
  Filled 2024-08-19 (×2): qty 1

## 2024-08-19 MED ORDER — PROPOFOL 10 MG/ML IV BOLUS
INTRAVENOUS | Status: DC | PRN
  Administered 2024-08-19: 30 mg via INTRAVENOUS
  Administered 2024-08-19: 110 mg via INTRAVENOUS

## 2024-08-19 MED ORDER — HEMOSTATIC AGENTS (NO CHARGE) OPTIME
TOPICAL | Status: DC | PRN
  Administered 2024-08-19: 1 via TOPICAL

## 2024-08-19 MED ORDER — OXYCODONE HCL 5 MG PO TABS: 5.0000 mg | ORAL_TABLET | Freq: Once | ORAL | Status: DC | PRN

## 2024-08-19 MED ORDER — FENTANYL CITRATE (PF) 100 MCG/2ML IJ SOLN
25.0000 ug | INTRAMUSCULAR | Status: DC | PRN
  Administered 2024-08-19 (×2): 25 ug via INTRAVENOUS

## 2024-08-19 MED ORDER — ROCURONIUM BROMIDE 10 MG/ML (PF) SYRINGE
PREFILLED_SYRINGE | INTRAVENOUS | Status: DC | PRN
  Administered 2024-08-19: 50 mg via INTRAVENOUS

## 2024-08-19 MED ORDER — FENTANYL CITRATE (PF) 250 MCG/5ML IJ SOLN
INTRAMUSCULAR | Status: AC
  Filled 2024-08-19: qty 5

## 2024-08-19 MED ORDER — DOCUSATE SODIUM 100 MG PO CAPS
100.0000 mg | ORAL_CAPSULE | Freq: Two times a day (BID) | ORAL | Status: DC
  Filled 2024-08-19 (×2): qty 1

## 2024-08-19 MED ORDER — PROPOFOL 10 MG/ML IV BOLUS
INTRAVENOUS | Status: AC
Start: 2024-08-19 — End: 2024-08-19
  Filled 2024-08-19: qty 20

## 2024-08-19 MED ORDER — CLOPIDOGREL BISULFATE 75 MG PO TABS
75.0000 mg | ORAL_TABLET | Freq: Every day | ORAL | Status: DC
Start: 2024-08-19 — End: 2024-08-20
  Administered 2024-08-19 – 2024-08-20 (×2): 75 mg via ORAL
  Filled 2024-08-19 (×2): qty 1

## 2024-08-19 MED ORDER — CEFAZOLIN SODIUM-DEXTROSE 2-3 GM-%(50ML) IV SOLR
INTRAVENOUS | Status: DC | PRN
  Administered 2024-08-19: 2 g via INTRAVENOUS

## 2024-08-19 MED ORDER — PANTOPRAZOLE SODIUM 40 MG PO TBEC
40.0000 mg | DELAYED_RELEASE_TABLET | Freq: Every day | ORAL | Status: DC
  Administered 2024-08-19 – 2024-08-20 (×2): 40 mg via ORAL
  Filled 2024-08-19 (×2): qty 1

## 2024-08-19 MED ORDER — PHENYLEPHRINE 80 MCG/ML (10ML) SYRINGE FOR IV PUSH (FOR BLOOD PRESSURE SUPPORT)
PREFILLED_SYRINGE | INTRAVENOUS | Status: DC | PRN
  Administered 2024-08-19: 160 ug via INTRAVENOUS

## 2024-08-19 MED ORDER — OXYCODONE HCL 5 MG/5ML PO SOLN: 5.0000 mg | Freq: Once | ORAL | Status: DC | PRN

## 2024-08-19 MED ORDER — POTASSIUM CHLORIDE CRYS ER 20 MEQ PO TBCR
20.0000 meq | EXTENDED_RELEASE_TABLET | Freq: Once | ORAL | Status: AC
  Administered 2024-08-19: 20 meq via ORAL
  Filled 2024-08-19: qty 1

## 2024-08-19 MED ORDER — SUGAMMADEX SODIUM 200 MG/2ML IV SOLN
INTRAVENOUS | Status: DC | PRN
  Administered 2024-08-19: 200 mg via INTRAVENOUS

## 2024-08-19 MED ORDER — IOHEXOL 350 MG/ML SOLN
100.0000 mL | Freq: Once | INTRAVENOUS | Status: AC | PRN
Start: 2024-08-19 — End: 2024-08-19
  Administered 2024-08-19: 100 mL via INTRAVENOUS

## 2024-08-19 MED ORDER — ACETAMINOPHEN 10 MG/ML IV SOLN
1000.0000 mg | Freq: Once | INTRAVENOUS | Status: DC | PRN
  Administered 2024-08-19: 1000 mg via INTRAVENOUS

## 2024-08-19 MED ORDER — ONDANSETRON HCL 4 MG/2ML IJ SOLN
INTRAMUSCULAR | Status: DC | PRN
Start: 2024-08-19 — End: 2024-08-19
  Administered 2024-08-19: 4 mg via INTRAVENOUS

## 2024-08-19 SURGICAL SUPPLY — 32 items
BAG COUNTER SPONGE SURGICOUNT (BAG) ×1 IMPLANT
BNDG ELASTIC 4INX 5YD STR LF (GAUZE/BANDAGES/DRESSINGS) IMPLANT
BNDG GAUZE DERMACEA FLUFF 4 (GAUZE/BANDAGES/DRESSINGS) IMPLANT
CANISTER SUCTION 3000ML PPV (SUCTIONS) ×1 IMPLANT
CLIP TI MEDIUM 24 (CLIP) ×1 IMPLANT
CLIP TI WIDE RED SMALL 24 (CLIP) ×1 IMPLANT
CUFF TOURN SGL QUICK 18 NS (TOURNIQUET CUFF) IMPLANT
DERMABOND ADVANCED .7 DNX12 (GAUZE/BANDAGES/DRESSINGS) ×1 IMPLANT
ELECTRODE REM PT RTRN 9FT ADLT (ELECTROSURGICAL) ×1 IMPLANT
EVACUATOR SILICONE 100CC (DRAIN) IMPLANT
GAUZE SPONGE 4X4 12PLY STRL (GAUZE/BANDAGES/DRESSINGS) IMPLANT
GLOVE BIOGEL PI IND STRL 8 (GLOVE) ×1 IMPLANT
GLOVE SURG SS PI 7.5 STRL IVOR (GLOVE) ×1 IMPLANT
GOWN STRL REUS W/ TWL LRG LVL3 (GOWN DISPOSABLE) ×2 IMPLANT
GOWN STRL REUS W/ TWL XL LVL3 (GOWN DISPOSABLE) ×1 IMPLANT
HEMOSTAT SNOW SURGICEL 2X4 (HEMOSTASIS) IMPLANT
KIT BASIN OR (CUSTOM PROCEDURE TRAY) ×1 IMPLANT
KIT TURNOVER KIT B (KITS) ×1 IMPLANT
PACK CV ACCESS (CUSTOM PROCEDURE TRAY) IMPLANT
PAD ARMBOARD POSITIONER FOAM (MISCELLANEOUS) ×2 IMPLANT
SOLN 0.9% NACL 1000 ML (IV SOLUTION) ×2 IMPLANT
SOLN 0.9% NACL POUR BTL 1000ML (IV SOLUTION) ×2 IMPLANT
SOLN STERILE WATER 1000 ML (IV SOLUTION) ×1 IMPLANT
SOLN STERILE WATER BTL 1000 ML (IV SOLUTION) ×1 IMPLANT
STAPLER SKIN PROX 35W (STAPLE) IMPLANT
SURGIFLO W/THROMBIN 8M KIT (HEMOSTASIS) IMPLANT
SUT PROLENE 5 0 C 1 24 (SUTURE) ×2 IMPLANT
SUT PROLENE 6 0 BV (SUTURE) ×1 IMPLANT
SUT VIC AB 2-0 CT1 TAPERPNT 27 (SUTURE) ×2 IMPLANT
SUT VIC AB 3-0 SH 27X BRD (SUTURE) ×2 IMPLANT
SUT VIC AB 4-0 PS2 18 (SUTURE) IMPLANT
TOWEL GREEN STERILE (TOWEL DISPOSABLE) ×1 IMPLANT

## 2024-08-19 NOTE — Op Note (Signed)
    Patient name: LAQUENTIN LOUDERMILK MRN: [id number] DOB: 03/17/1951 Sex: male  08/19/2024 Pre-operative Diagnosis: Right radial artery pseudoaneurysm Post-operative diagnosis:  Same Surgeon:  Malvina New Procedure:   Repair of ruptured right radial artery pseudoaneurysm Anesthesia:  General Blood Loss:  minimal Specimens:  none  Findings: Defect in the posterior wall of the radial artery was repaired primarily  Indications: This is a 73 year old gentleman who is status post TCAR about a week ago.  He woke up earlier with pain and numbness and swelling in his right arm.  He went to the emergency department.  A CT scan was performed that showed active extravasation from the radial artery.  We discussed going to the operating room for repair.  He recently took his Xarelto  and is on aspirin  and Plavix   Procedure:  The patient was identified in the holding area and taken to [facility] OR ROOM 11  The patient was then placed supine on the table. general anesthesia was administered.  The patient was prepped and draped in the usual sterile fashion.  A time out was called and antibiotics were administered.  A longitudinal incision was made down to the right wrist.  Cautery was used divide the subcutaneous tissue.  I then sharply dissected out the right radial artery.  It was moderately scarred in from his previous A-line procedures.  I got circumferential control and then identified the area of bleeding on the posterior side of the radial artery.  This was repaired with a 6-0 Prolene suture.  The wound was then irrigated.  I spent a long time getting hemostasis.  I placed Surgiflo in the wound.  I reapproximated subcutaneous tissue with interrupted 3-0 Vicryl and closed the skin with staples.  He was extubated and taken recovery in stable condition.   Disposition: To PACU stable.   ALONSO Malvina New, M.D., [facility] [place] Office: [phone] Pager:  [phone]

## 2024-08-19 NOTE — ED Notes (Signed)
 Report called to  [place] CRNA  patient will to to PACU bay 17. All belonging  given to patient cousin W[place].

## 2024-08-19 NOTE — ED Provider Notes (Signed)
 Blood pressure (!) 116/94, pulse 78, temperature 97.9 F (36.6 C), resp. rate 18, height 6' 1 (1.854 m), weight 105.8 kg, SpO2 99%.  Assuming care from Dr. Jerral.  In short, [name] Rice is a 73 y.o. male with a chief complaint of Post-op Problem .  Refer to the original H&P for additional details.  The current plan of care is to coordinate with Vascular Surgery.  04:31 AM Patient arrived [facility].  Discussed with Dr. Serene that the patient has arrived. CTA reviewed.     Darra Fonda MATSU, MD 08/19/24 [phone]

## 2024-08-19 NOTE — ED Notes (Signed)
 Pt arrives via POV, vitals updated upon arrival.

## 2024-08-19 NOTE — Plan of Care (Signed)

## 2024-08-19 NOTE — ED Triage Notes (Signed)
 Pt states TCAR sx 10 days ago  States feels like having a reaction  Brusing to right ar, pain to arm and hand numbness that started 3 hours ago    Extensive cardiac history  Extreme SOB when walking to triage room, better at rest

## 2024-08-19 NOTE — ED Provider Notes (Signed)
 [name] Provider Note   CSN: [id number] Arrival date & time: 08/19/24  0042     History Chief Complaint  Patient presents with   Post-op Problem    HPI [name] Rice is a 73 y.o. male presenting for right arm pain and soreness. States that he had a transarterial carotid endarterectomy of his right carotid last week. Denies FCNVSSOb Has resistant AFIB and lives in afib.  Triage nurse noted shortness of breath but patient states that he actually feels better than he typically does as he lives in A-fib and has chronic heart failure with dyspnea on exertion. He feels comfortable at rest. However he states that 9 PM time he was woken up by sudden onset right arm swelling and pain.  He recently had a arterial line placed for the recent surgery.  The lesion had healed up completely until that suddenly started swelling and bruising on his right volar forearm.  He states it is incredibly uncomfortable and tender to palpation.  He does have some numbness in his palm but still has mobility of his fingers.  Denies fevers chills nausea vomiting syncope shortness of breath.  Patient's recorded medical, surgical, social, medication list and allergies were reviewed in the Snapshot window as part of the initial history.   Review of Systems   Review of Systems  Constitutional:  Negative for chills and fever.  HENT:  Negative for ear pain and sore throat.   Eyes:  Negative for pain and visual disturbance.  Respiratory:  Negative for cough and shortness of breath.   Cardiovascular:  Negative for chest pain and palpitations.  Gastrointestinal:  Negative for abdominal pain and vomiting.  Genitourinary:  Negative for dysuria and hematuria.  Musculoskeletal:  Negative for arthralgias and back pain.  Skin:  Negative for color change and rash.  Neurological:  Negative for seizures and syncope.  All other systems reviewed and are negative.   Physical  Exam Updated Vital Signs BP 133/76 (BP Location: Left Arm)   Pulse 69   Temp 98.5 F (36.9 C) (Oral)   Resp 20   Ht 6' 1 (1.854 m)   Wt 105.8 kg   SpO2 100%   BMI 30.77 kg/m  Physical Exam Vitals and nursing note reviewed.  Constitutional:      General: He is not in acute distress.    Appearance: He is well-developed.  HENT:     Head: Normocephalic and atraumatic.  Eyes:     Conjunctiva/sclera: Conjunctivae normal.  Cardiovascular:     Rate and Rhythm: Normal rate and regular rhythm.     Heart sounds: No murmur heard. Pulmonary:     Effort: Pulmonary effort is normal. No respiratory distress.     Breath sounds: Normal breath sounds.  Abdominal:     Palpations: Abdomen is soft.     Tenderness: There is no abdominal tenderness.  Musculoskeletal:        General: Deformity present. No swelling.     Cervical back: Neck supple.     Comments: Substantial bruising and firmness to his right volar forearm. Bruising extends from right radial wrist all the way up to right elbow. Very tender to palpation along the volar compartment. Pain with passive extension. Some reported numbness over the palm though none in the distal fingertips.  Skin:    General: Skin is warm and dry.     Capillary Refill: Capillary refill takes less than 2 seconds.  Neurological:  Mental Status: He is alert.  Psychiatric:        Mood and Affect: Mood normal.      ED Course/ Medical Decision Making/ A&P    Procedures .Critical Care  Performed by: Jerral Meth, MD Authorized by: Jerral Meth, MD   Critical care provider statement:    Critical care time (minutes):  30   Critical care was necessary to treat or prevent imminent or life-threatening deterioration of the following conditions: arterial extravasation.   Critical care was time spent personally by me on the following activities:  Development of treatment plan with patient or surrogate, discussions with consultants, evaluation of  patient's response to treatment, examination of patient, ordering and review of laboratory studies, ordering and review of radiographic studies, ordering and performing treatments and interventions, pulse oximetry, re-evaluation of patient's condition and review of old charts .Ultrasound ED Soft Tissue  Date/Time: 08/19/2024 3:53 AM  Performed by: Jerral Meth, MD Authorized by: Jerral Meth, MD   Procedure details:    Transverse view:  Visualized   Longitudinal view:  Visualized   Images: archived   Location:    Location: upper extremity     Side:  Right Comments:     Large fluid collection identified with what appears to be pulsatile flow from the right radial artery.    Medications Ordered in ED Medications  iohexol  (OMNIPAQUE ) 350 MG/ML injection 100 mL (100 mLs Intravenous Contrast Given 08/19/24 0304)    Medical Decision Making:   This a 73 year old male presenting with acute right arm pain.  Also reported shortness of breath per triage nurse. Patient overall ambulatory tolerating p.o. intake.  Lab work shows no acute pathology.  Ultrasound was performed of his right upper extremity showing large fluid collection with apparent connection to right radial artery. I immediately consulted vascular surgery due to concern for developing fluid collection with arterial source and concern for possibility of developing compartment syndrome over the evening.  They recommended getting a CTA and having the patient transferred ER to ER for in person evaluation. CTA performed and patient insistent on POV transfer and is calling a ride.  He understood that delays could complicate his care. Fortunately his syndrome seem to improve while he was waiting, pain decreased numbness seem to slowly improved.  CTA did confirm arterial extravasation.  Tertiary care center team updated, patient proceeding directly to tertiary facility at this time.   Clinical Impression:  1. Injury of right  radial artery, initial encounter      Transfer via POV   Final Clinical Impression(s) / ED Diagnoses Final diagnoses:  Injury of right radial artery, initial encounter    Rx / DC Orders ED Discharge Orders     None         Jerral Meth, MD 08/19/24 [phone]

## 2024-08-19 NOTE — Anesthesia Preprocedure Evaluation (Addendum)
 Anesthesia Evaluation  Patient identified by MRN, date of birth, ID band Patient awake    Reviewed: Allergy & Precautions, NPO status , Patient's Chart, lab work & pertinent test results, reviewed documented beta blocker date and time   History of Anesthesia Complications Negative for: history of anesthetic complications  Airway Mallampati: III  TM Distance: >3 FB     Dental  (+) Missing, Edentulous Upper, Dental Advisory Given   Pulmonary shortness of breath and with exertion, neg COPD, neg recent URI   breath sounds clear to auscultation       Cardiovascular hypertension, Pt. on medications pulmonary hypertension (mod)(-) angina + CAD, + CABG and +CHF  + dysrhythmias Atrial Fibrillation (-) pacemaker(-) Cardiac Defibrillator  Rhythm:Irregular  RA: 7 mmHg RV: 72/2 mmHg PA: 74/9 mmHg, mPAP 30 mmHg PCW: 19 mmHg with tall v wave  IMPRESSIONS     1. Left ventricular ejection fraction, by estimation, is 60 to 65%. The  left ventricle has normal function. The left ventricle has no regional  wall motion abnormalities. Left ventricular diastolic function could not  be evaluated.   2. Right ventricular systolic function is mildly reduced. The right  ventricular size is normal. There is mildly elevated pulmonary artery  systolic pressure. The estimated right ventricular systolic pressure is  37.1 mmHg.   3. The mitral valve is degenerative. Trivial mitral valve regurgitation.  No evidence of mitral stenosis. Moderate mitral annular calcification.   4. The aortic valve is calcified. Aortic valve regurgitation is not  visualized. Aortic valve sclerosis is present, with no evidence of aortic  valve stenosis.   5. The inferior vena cava is normal in size with greater than 50%  respiratory variability, suggesting right atrial pressure of 3 mmHg.     Neuro/Psych neg Seizures Summary:  Right Carotid: Velocities in the right ICA are  consistent with a 80-99%                 stenosis.                 Several spectral Doppler samples obtained. Only one sample                 obtained in the 80-99% stenosis category which may be  slightly                overestimated due to cardiac arrhytmia. All other samples  taken                reflect a 60-79% stenosis.   Left Carotid: Velocities in the left ICA are consistent with a 60-79%  stenosis.      GI/Hepatic negative GI ROS,,,(+) neg Cirrhosis        Endo/Other  negative endocrine ROS    Renal/GU CRFRenal diseaseLab Results      Component                Value               Date                      NA                       134 (L)             08/19/2024                K  3.9                 08/19/2024                CO2                      22                  08/19/2024                GLUCOSE                  102 (H)             08/19/2024                BUN                      16                  08/19/2024                CREATININE               1.18                08/19/2024                CALCIUM                   8.9                 08/19/2024                EGFR                     55 (L)              01/31/2024                GFRNONAA                 >60                 08/19/2024                Musculoskeletal   Abdominal   Peds  Hematology  (+) Blood dyscrasia, anemia Lab Results      Component                Value               Date                      WBC                      5.9                 08/19/2024                HGB                      9.9 (L)             08/19/2024                HCT                      33.9 (L)  08/19/2024                MCV                      74.5 (L)            08/19/2024                PLT                      254                 08/19/2024              Anesthesia Other Findings   Reproductive/Obstetrics                              Anesthesia  Physical Anesthesia Plan  ASA: 3  Anesthesia Plan: General   Post-op Pain Management:    Induction: Intravenous  PONV Risk Score and Plan: 2 and Ondansetron  and Dexamethasone   Airway Management Planned: Oral ETT  Additional Equipment: Arterial line  Intra-op Plan:   Post-operative Plan: Extubation in OR  Informed Consent: I have reviewed the patients History and Physical, chart, labs and discussed the procedure including the risks, benefits and alternatives for the proposed anesthesia with the patient or authorized representative who has indicated his/her understanding and acceptance.     Dental advisory given  Plan Discussed with: CRNA  Anesthesia Plan Comments: (PAT note by Lynwood Hope, PA-C: 73 year old male follows with cardiology for history of HFpEF, recovered cardiomyopathy, CAD s/p CABG x 4 07/2020, s/p maze procedure with left atrial appendage ligation on Xarelto , asymptomatic bilateral carotid artery stenosis, history of NSVT, persistent atrial fibrillation, HTN, HLD, left bundle branch block.  Most recent echo 11/2022 with LVEF 60 to 65%, normal wall motion, RV systolic function mildly reduced, mild PASP 37 mmHg, no significant valvular abnormalities.  Last seen by Dr. Michele on 01/26/2024 and noted to be feeling well from cardiac standpoint.  Current medications were continued and he was recommended to follow-up in 1 year.  Patient seen in follow-up by vascular surgeon Dr. Vonzell on 07/10/2024 for reevaluation of asymptomatic carotid stenosis.  Recent imaging showed critical (80 to 99%) stenosis in the R ICA.  He has 60 to 79% stenosis in the LICA.  Right TCAR recommended.  Patient reports last dose of Xarelto  08/05/2024.  Preop labs reviewed, mild hyponatremia sodium 132, creatinine mildly elevated 1.38, mild anemia with hemoglobin 1.6.  EKG 01/26/2024: Atrial fibrillation.  Rate 65. Indeterminate axis. Non-specific intra-ventricular conduction block. Minimal voltage  criteria for LVH, may be normal variant. Lateral infarct , age undetermined.  No significant change.  Carotid duplex 05/29/2024: Summary:  Right Carotid: Velocities in the right ICA are consistent with a 80-99%  stenosis.  Several spectral Doppler samples obtained. Only one sample obtained in the 80-99% stenosis category which may be slightly overestimated due to cardiac arrhytmia. All other samples taken reflect a 60-79% stenosis.  Left Carotid: Velocities in the left ICA are consistent with a 60-79% stenosis.  Vertebrals: Bilateral vertebral arteries demonstrate antegrade flow.  Subclavians: Normal flow hemodynamics were seen in bilateral subclavian arteries.   TTE 12/07/2022: 1. Left ventricular ejection fraction, by estimation, is 60 to 65%. The  left ventricle has normal function. The left ventricle has no regional  wall motion abnormalities. Left ventricular diastolic function could not  be  evaluated.   2. Right ventricular systolic function is mildly reduced. The right  ventricular size is normal. There is mildly elevated pulmonary artery  systolic pressure. The estimated right ventricular systolic pressure is  37.1 mmHg.   3. The mitral valve is degenerative. Trivial mitral valve regurgitation.  No evidence of mitral stenosis. Moderate mitral annular calcification.   4. The aortic valve is calcified. Aortic valve regurgitation is not  visualized. Aortic valve sclerosis is present, with no evidence of aortic  valve stenosis.   5. The inferior vena cava is normal in size with greater than 50%  respiratory variability, suggesting right atrial pressure of 3 mmHg.    )         Anesthesia Quick Evaluation

## 2024-08-19 NOTE — ED Notes (Signed)
 OP consent signed.

## 2024-08-19 NOTE — Transfer of Care (Signed)
 Immediate Anesthesia Transfer of Care Note  Patient: [name] Rice  Procedure(s) Performed: RIGHT RADIAL PSEUDOANEURYSM REPAIR (Right: Arm Lower)  Patient Location: PACU  Anesthesia Type:General  Level of Consciousness: awake, alert , oriented, and patient cooperative  Airway & Oxygen Therapy: Patient Spontanous Breathing and Patient connected to face mask oxygen  Post-op Assessment: Report given to RN, Post -op Vital signs reviewed and stable, Patient moving all extremities, and Patient moving all extremities X 4  Post vital signs: Reviewed and stable  Last Vitals:  Vitals Value Taken Time  BP 157/73 08/19/24 07:45  Temp    Pulse 63 08/19/24 07:47  Resp 25 08/19/24 07:47  SpO2 99 % 08/19/24 07:47  Vitals shown include unfiled device data.  Last Pain:  Vitals:   08/19/24 0525  TempSrc: Oral  PainSc:          Complications: No notable events documented.

## 2024-08-19 NOTE — Anesthesia Procedure Notes (Signed)
 Procedure Name: Intubation Date/Time: 08/19/2024 6:31 AM  Performed by: Roddie Grate, CRNAPre-anesthesia Checklist: Patient identified, Emergency Drugs available, Suction available, Patient being monitored and Timeout performed Patient Re-evaluated:Patient Re-evaluated prior to induction Oxygen Delivery Method: Circle system utilized Preoxygenation: Pre-oxygenation with 100% oxygen Induction Type: IV induction, Rapid sequence and Cricoid Pressure applied Laryngoscope Size: Mac and 4 Grade View: Grade I Tube type: Oral Number of attempts: 1 Airway Equipment and Method: Stylet Placement Confirmation: ETT inserted through vocal cords under direct vision, positive ETCO2 and breath sounds checked- equal and bilateral Secured at: 24 cm Tube secured with: Tape Dental Injury: Teeth and Oropharynx as per pre-operative assessment  Comments: Smooth IV Induction. Eyes taped. RSI Performed. DL x 1 with grade 1 view. Atraumatically placed, teeth and lip remain intact as pre-op. Secured with tape. Bilateral breath sounds +/=, EtCO2 +, Adequate TV, VSS.

## 2024-08-19 NOTE — ED Notes (Signed)
 Patient was transferred from [facility] for vascular consult, patient is alert oriented , sister with patient . Able to move fingers right hand at present. Fingers warm to touch.

## 2024-08-19 NOTE — Anesthesia Postprocedure Evaluation (Signed)
 Anesthesia Post Note  Patient: [name] Rice  Procedure(s) Performed: RIGHT RADIAL PSEUDOANEURYSM REPAIR (Right: Arm Lower)     Patient location during evaluation: PACU Anesthesia Type: General Level of consciousness: awake and alert, oriented and patient cooperative Pain management: pain level controlled Vital Signs Assessment: post-procedure vital signs reviewed and stable Respiratory status: spontaneous breathing, nonlabored ventilation and respiratory function stable Cardiovascular status: blood pressure returned to baseline and stable Postop Assessment: no apparent nausea or vomiting Anesthetic complications: no   No notable events documented.  Last Vitals:  Vitals:   08/19/24 0815 08/19/24 0900  BP: 136/67 (!) 156/70  Pulse: 65 (!) 59  Resp: 13 12  Temp:  (!) 36.3 C  SpO2: 99%     Last Pain:  Vitals:   08/19/24 0900  TempSrc: Axillary  PainSc:                  Kaylean Tupou,E. Hatem Cull

## 2024-08-19 NOTE — Consult Note (Signed)
 [facility]  Patient name: [name] Rice MRN: [id number] DOB: 09/25/1951 Sex: male   REQUESTING PROVIDER:   ER   REASON FOR CONSULT:    Right arm pain  HISTORY OF PRESENT ILLNESS:   SAHARSH STERLING is a 73 y.o. male, who presented to the [facility] with sudden onset of right arm pain and swelling that began at 9 PM.  He is status post right carotid endarterectomy on 08/09/2024 by Dr. Magda.  This was for asymptomatic stenosis.  His recovery has been uncomplicated.  He now complains of pain and swelling in his forearm as well as numbness in his hand.  He is currently on triple therapy.  He takes Xarelto  for A-fib and is on aspirin  and Plavix  for his recent carotid stent.  He takes a statin for hypercholesterolemia.  He is a non-smoker.  He does have chronic heart failure.  His most recent echo was over a year ago with an ejection fraction of 65%  PAST MEDICAL HISTORY    Past Medical History:  Diagnosis Date   Atrial fibrillation (HCC)    Carotid artery stenosis    Chronic diastolic (congestive) heart failure (HCC)    Coronary artery disease    Dysrhythmia    Hyperlipidemia    Hypertension    LBBB (left bundle branch block)      FAMILY HISTORY   Family History  Problem Relation Age of Onset   Heart disease Mother    Hyperlipidemia Mother    Heart disease Father    Hypertension Other     SOCIAL HISTORY:   Social History   Socioeconomic History   Marital status: Single    Spouse name: Not on file   Number of children: 0   Years of education: Not on file   Highest education level: Not on file  Occupational History   Occupation: retired  Tobacco Use   Smoking status: Never   Smokeless tobacco: Never  Vaping Use   Vaping status: Never Used  Substance and Sexual Activity   Alcohol use: Yes    Comment: Occas beer   Drug use: No   Sexual activity: Not on file  Other Topics Concern    Not on file  Social History Narrative   Not on file   Social Drivers of Health   Financial Resource Strain: Not on file  Food Insecurity: No Food Insecurity (12/06/2022)   Hunger Vital Sign    Worried About Running Out of Food in the Last Year: Never true    Ran Out of Food in the Last Year: Never true  Transportation Needs: No Transportation Needs (12/06/2022)   PRAPARE - Administrator, Civil Service (Medical): No    Lack of Transportation (Non-Medical): No  Physical Activity: Not on file  Stress: Not on file  Social Connections: Not on file  Intimate Partner Violence: Not At Risk (12/06/2022)   Humiliation, Afraid, Rape, and Kick questionnaire    Fear of Current or Ex-Partner: No    Emotionally Abused: No    Physically Abused: No    Sexually Abused: No    ALLERGIES:    No Known Allergies  CURRENT MEDICATIONS:    No current facility-administered medications for this encounter.   Current Outpatient Medications  Medication Sig Dispense Refill   Aspirin  81 MG CAPS Take 81 mg by mouth daily. 30 capsule 6   clopidogrel  (PLAVIX ) 75 MG tablet Take 1 tablet (75 mg  total) by mouth daily. 30 tablet 6   dapagliflozin  propanediol (FARXIGA ) 10 MG TABS tablet Take 1 tablet (10 mg total) by mouth daily before breakfast. 90 tablet 3   rosuvastatin  (CRESTOR ) 20 MG tablet TAKE 1 TABLET BY MOUTH AT  BEDTIME 100 tablet 3   sacubitril -valsartan  (ENTRESTO ) 49-51 MG Take 1 tablet by mouth 2 (two) times daily. 180 tablet 3   spironolactone  (ALDACTONE ) 25 MG tablet TAKE 1 TABLET BY MOUTH DAILY 100 tablet 2   torsemide  (DEMADEX ) 20 MG tablet Take 1 tablet (20 mg total) by mouth daily as needed (weight gain or fluid retention). 90 tablet 3   traMADol  (ULTRAM ) 50 MG tablet Take 1 tablet (50 mg total) by mouth every 6 (six) hours as needed for moderate pain (pain score 4-6). 12 tablet 0   XARELTO  20 MG TABS tablet TAKE 1 TABLET BY MOUTH DAILY  WITH SUPPER 100 tablet 2    REVIEW OF  SYSTEMS:   [X]  denotes positive finding, [ ]  denotes negative finding Cardiac  Comments:  Chest pain or chest pressure:    Shortness of breath upon exertion:    Short of breath when lying flat:    Irregular heart rhythm:        Vascular    Pain in calf, thigh, or hip brought on by ambulation:    Pain in feet at night that wakes you up from your sleep:     Blood clot in your veins:    Leg swelling:         Pulmonary    Oxygen at home:    Productive cough:     Wheezing:         Neurologic    Sudden weakness in arms or legs:     Sudden numbness in arms or legs:     Sudden onset of difficulty speaking or slurred speech:    Temporary loss of vision in one eye:     Problems with dizziness:         Gastrointestinal    Blood in stool:      Vomited blood:         Genitourinary    Burning when urinating:     Blood in urine:        Psychiatric    Major depression:         Hematologic    Bleeding problems:    Problems with blood clotting too easily:        Skin    Rashes or ulcers:        Constitutional    Fever or chills:     PHYSICAL EXAM:   Vitals:   08/19/24 0130 08/19/24 0334 08/19/24 0416 08/19/24 0525  BP: (!) 147/71 133/76 (!) 116/94 (!) 119/54  Pulse: 69 69 78 61  Resp: (!) 22 20 18 18   Temp:  98.5 F (36.9 C) 97.9 F (36.6 C) (!) 97.4 F (36.3 C)  TempSrc:  Oral  Oral  SpO2: 100% 100% 99% 100%  Weight:      Height:        GENERAL: The patient is a well-nourished male, in no acute distress. The vital signs are documented above. CARDIAC: There is a regular rate and rhythm.  VASCULAR: Doppler signal in right radial artery.  Ecchymosis over the distal wrist PULMONARY: Nonlabored respirations ABDOMEN: Soft and non-tender  MUSCULOSKELETAL: There are no major deformities or cyanosis. NEUROLOGIC: Numbness in the right fingers but preserved motor function SKIN: There are no  ulcers or rashes noted. PSYCHIATRIC: The patient has a normal affect.  STUDIES:    I have reviewed the CT scan of the pipeline:   ASSESSMENT and PLAN   Ruptured right radial artery pseudoaneurysm: This is likely from his A-line at the time of his TCAR.  He does have signs of neurovascular compromise with numbness in his fingers.  There is also fullness in his forearm.  I discussed taking him to the operating room for repair of the area of extravasation.  Because he is on triple therapy (anticoagulation), there is a possibility that I packed the wound and leave it open for the delay closure in the OR versus using staples.  I also discussed that may need to make a bigger incision if I need to do compartment releases.  All questions were answered and he is willing to proceed.   Malvina Serene CLORE, MD, FACS [facility] [phone] Pager [phone]

## 2024-08-20 ENCOUNTER — Other Ambulatory Visit (HOSPITAL_COMMUNITY): Payer: Self-pay

## 2024-08-20 ENCOUNTER — Encounter (HOSPITAL_COMMUNITY): Payer: Self-pay | Admitting: Surgery

## 2024-08-20 DIAGNOSIS — Z9889 Other specified postprocedural states: Secondary | ICD-10-CM

## 2024-08-20 MED ORDER — TRAMADOL HCL 50 MG PO TABS
50.0000 mg | ORAL_TABLET | Freq: Four times a day (QID) | ORAL | 0 refills | Status: AC | PRN
  Filled 2024-08-20: qty 12, 3d supply, fill #0

## 2024-08-20 MED ORDER — RIVAROXABAN 20 MG PO TABS: 20.0000 mg | ORAL_TABLET | Freq: Every day | ORAL | Status: DC

## 2024-08-20 NOTE — Progress Notes (Signed)
 Patient given discharge instructions by discharge RN. Patient taken to discharge lounge by staff.

## 2024-08-20 NOTE — Discharge Instructions (Signed)
   [facility]  Discharge Instructions  Please refer to the following instructions for your post-procedure care. Your surgeon or physician assistant will discuss any changes with you.  Activity  You may drive the day following your surgery, if you are comfortable and no longer taking prescription pain medication. Resume full activity as the soreness in your incision resolves.  Bathing/Showering  You may shower after you go home. Keep your incision dry for 48 hours. Do not soak in a bathtub, hot tub, or swim until the incision heals completely.   Incision Care  Clean your incision with mild soap and water . Place a clean bandage on your incision daily as needed for any drainage.  Diet  Resume your normal diet. There are not special food restrictions following this procedure. In order to heal from your surgery, it is CRITICAL to get adequate nutrition. Your body requires vitamins, minerals, and protein. Vegetables are the best source of vitamins and minerals. Vegetables also provide the perfect balance of protein. Processed food has little nutritional value, so try to avoid this.  Medications  Resume taking all of your medications. If your incision is causing pain, you make take over the counter Tylenol  or your prescribed narcotic.   You may restart your Xarelto  on 08/22/2024.  Follow up You will have a follow up with your surgeon in 3 weeks for staple removal  Please call us  immediately for any of the following conditions:  Increased pain, redness, drainage (pus) from your incision site Fever of 101 degrees or higher Severe or worsening pain at your incision site Hand pain or numbness   08/20/2024 [name] Rice [id number] 11-29-1950  Surgeon(s): Serene Gaile ORN, MD  Procedure(s): RIGHT RADIAL PSEUDOANEURYSM REPAIR  If you have any questions, please call the office at [phone].

## 2024-08-20 NOTE — Progress Notes (Addendum)
  Progress Note    08/20/2024 7:39 AM 1 Day Post-Op  Subjective: Says he feels great.  Denies any pain at his incision site.    Vitals:   08/20/24 0005 08/20/24 0350  BP: 132/73 130/75  Pulse:    Resp: 19 20  Temp: 97.7 F (36.5 C) 98.4 F (36.9 C)  SpO2: 100% 100%    Physical Exam: General: Resting in bed, NAD Cardiac: Regular Lungs: Nonlabored Incisions: Dressing changed on the right arm.  Right wrist incision intact with staples, moderate bloody drainage.  Some old bruising of the right forearm, however this is soft Extremities: Palpable right radial pulse.  Intact motor and sensation of right hand   CBC    Component Value Date/Time   WBC 5.3 08/19/2024 1213   RBC 4.32 08/19/2024 1213   HGB 9.4 (L) 08/19/2024 1213   HGB 10.0 (L) 01/31/2024 0934   HCT 32.3 (L) 08/19/2024 1213   HCT 35.3 (L) 01/31/2024 0934   PLT 241 08/19/2024 1213   PLT 262 12/13/2019 0936   MCV 74.8 (L) 08/19/2024 1213   MCV 93 12/13/2019 0936   MCH 21.8 (L) 08/19/2024 1213   MCHC 29.1 (L) 08/19/2024 1213   RDW 19.8 (H) 08/19/2024 1213   RDW 13.0 12/13/2019 0936   LYMPHSABS 1.1 08/19/2024 0142   MONOABS 0.9 08/19/2024 0142   EOSABS 0.2 08/19/2024 0142   BASOSABS 0.1 08/19/2024 0142    BMET    Component Value Date/Time   NA 131 (L) 08/19/2024 1213   NA 138 01/31/2024 0934   K 4.4 08/19/2024 1213   CL 101 08/19/2024 1213   CO2 17 (L) 08/19/2024 1213   GLUCOSE 235 (H) 08/19/2024 1213   BUN 15 08/19/2024 1213   BUN 22 01/31/2024 0934   CREATININE 1.31 (H) 08/19/2024 1213   CALCIUM  8.4 (L) 08/19/2024 1213   GFRNONAA 58 (L) 08/19/2024 1213   GFRAA 85 11/13/2020 1022    INR    Component Value Date/Time   INR 1.9 (H) 08/19/2024 1213     Intake/Output Summary (Last 24 hours) at 08/20/2024 0739 Last data filed at 08/20/2024 0738 Gross per 24 hour  Intake --  Output 1020 ml  Net -1020 ml      Assessment/Plan:  73 y.o. male is 1 day postop, s/p: Open repair of rupture  and right radial artery pseudoaneurysm   - Overall he is doing well this morning without any complaints.  He denies any pain currently at his incision site -I have changed the dressing on his right arm.  He had moderate bloody drainage on his bandaging.  No evidence of hematoma or brisk arterial bleeding.  His right wrist incision is intact with staples -Right upper extremity remains well-perfused with a palpable radial pulse.  His right hand is warm to touch and he has intact motor and sensation of the hand -Continue aspirin , Plavix , and statin.  May tentatively restart Xarelto  today and discharge home.  He will need to mobilize first   Ahmed Holster, [place] [facility] [phone] 08/20/2024 7:39 AM  I agree with the above.  Numbness and pain in right arm is dramatically better.  Anticipate discharge home today.  Will hold off starting Xarelto  until Wednesday.  Malvina New

## 2024-08-24 NOTE — Discharge Summary (Signed)
 Discharge Summary  Patient ID: [name] Rice [id number] 73 y.o. [birth year]  Admit date: 08/19/2024  Discharge date and time: 08/20/2024 10:48 AM   Admitting Physician: Gaile LELON New, MD   Discharge Physician: Gaile LELON New, MD   Admission Diagnoses: Injury of right radial artery, initial encounter [S55.101A] Pseudoaneurysm [I72.9] PAD (peripheral artery disease) [I73.9]  Discharge Diagnoses:  Injury of right radial artery, initial encounter [S55.101A] Pseudoaneurysm [I72.9] PAD (peripheral artery disease) [I73.9]  Admission Condition: fair  Discharged Condition: good  Indication for Admission: This is a 73 year old gentleman who is status post TCAR about a week ago. He woke up earlier with pain and numbness and swelling in his right arm. He went to the emergency department. A CT scan was performed that showed active extravasation from the radial artery. We discussed going to the operating room for repair. He recently took his Xarelto  and is on aspirin  and Plavix    Hospital Course: The patient was admitted to the hospital on 08/19/2024 and underwent the following surgery: Repair of ruptured right radial artery pseudoaneurysm.  He tolerated the procedure well was transferred to the PACU in stable condition.  On POD 1 he was doing well without any complaints.  His numbness and pain in the right arm was significantly better.  His incision was intact with staples.  There was no hematoma present.  He had a palpable right radial pulse.  His right hand was warm to touch with intact motor and sensation.  He was tolerating a normal diet and mobilizing at baseline.  He was to continue his aspirin , Plavix , and statin.  His Xarelto  could restart on 10/15.  He was discharged home.  Consults: None  Treatments: IV hydration, antibiotics: Ancef , analgesia: oxycodone , and surgery: repair of ruptured right radial pseudoaneurysm  Discharge Exam: See progress note  Vitals:   08/20/24 0827  08/20/24 0938  BP: (!) 135/46 (!) 140/62  Pulse: (!) 53 62  Resp: 17   Temp: 98.5 F (36.9 C)   SpO2:       Disposition: Discharge disposition: 01-Home or Self Care       Patient Instructions:  Allergies as of 08/20/2024   No Known Allergies      Medication List     TAKE these medications    Aspirin  81 MG Caps Take 81 mg by mouth daily.   clopidogrel  75 MG tablet Commonly known as: PLAVIX  Take 1 tablet (75 mg total) by mouth daily.   Entresto  49-51 MG Generic drug: sacubitril -valsartan  Take 1 tablet by mouth 2 (two) times daily.   Farxiga  10 MG Tabs tablet Generic drug: dapagliflozin  propanediol Take 1 tablet (10 mg total) by mouth daily before breakfast.   rivaroxaban  20 MG Tabs tablet Commonly known as: Xarelto  Take 1 tablet (20 mg total) by mouth daily with supper.   rosuvastatin  20 MG tablet Commonly known as: CRESTOR  TAKE 1 TABLET BY MOUTH AT  BEDTIME   spironolactone  25 MG tablet Commonly known as: ALDACTONE  TAKE 1 TABLET BY MOUTH DAILY   torsemide  20 MG tablet Commonly known as: DEMADEX  Take 1 tablet (20 mg total) by mouth daily as needed (weight gain or fluid retention).   traMADol  50 MG tablet Commonly known as: Ultram  Take 1 tablet (50 mg total) by mouth every 6 (six) hours as needed. What changed: reasons to take this               Discharge Care Instructions  (From admission, onward)  Start     Ordered   08/20/24 0000  Discharge wound care:       Comments: Change the dressing on your right arm as needed. Clean your incision daily with soap and water , then pat dry. Do not put any ointments, creams, alcohol, or peroxide on your incision. You can leave your incision open to air if there is no more drainage.   08/20/24 0916           Activity: activity as tolerated, no driving while on analgesics, and no heavy lifting for 5 weeks Diet: regular diet Wound Care: keep wound clean and dry  Follow-up with [facility] in  3 weeks.  SignedBETHA Ahmed Holster, PA-C 08/24/2024 10:53 AM [facility] Office: [phone]

## 2024-09-10 ENCOUNTER — Other Ambulatory Visit: Payer: Self-pay | Admitting: Cardiology

## 2024-09-10 ENCOUNTER — Other Ambulatory Visit: Payer: Self-pay

## 2024-09-10 ENCOUNTER — Other Ambulatory Visit (HOSPITAL_COMMUNITY): Payer: Self-pay

## 2024-09-10 DIAGNOSIS — I5032 Chronic diastolic (congestive) heart failure: Secondary | ICD-10-CM

## 2024-09-11 ENCOUNTER — Encounter: Payer: Self-pay | Admitting: Physician Assistant

## 2024-09-11 ENCOUNTER — Ambulatory Visit: Attending: Surgery | Admitting: Physician Assistant

## 2024-09-11 VITALS — BP 119/52 | HR 46 | Temp 98.3°F | Ht 73.0 in | Wt 236.9 lb

## 2024-09-11 DIAGNOSIS — I6523 Occlusion and stenosis of bilateral carotid arteries: Secondary | ICD-10-CM

## 2024-09-11 NOTE — Progress Notes (Signed)
  TCAR post operative follow up    CC:  follow up. Requesting Provider:  No ref. provider found  HPI: This is a 73 y.o. male here for follow up for carotid artery stenosis.  Pt is s/p right TCAR for asymptomatic carotid artery stenosis on 08/09/2024 by Dr. Magda.    On 08/19/2024, he underwent repair of ruptured right radial artery pseudoaneurysm by Dr. Serene, most likely from a-line injury.  Pt returns today for follow up.    Pt denies any amaurosis fugax, speech difficulties, weakness, numbness, paralysis or clumsiness or facial droop.   He states he knicked his skin and it took about 30 minutes of compression to get it to stop bleeding and for this reason, he would like to stop the plavix  and asa.     The pt is on a statin for cholesterol management.  The pt is on a daily aspirin .   Other AC:  Plavix /Xarelto  The pt is on ARB, diuretic for hypertension.   The pt is not on medication for diabetes Tobacco hx:  never   PHYSICAL EXAMINATION:  Today's Vitals   09/11/24 1015  BP: (!) 119/52  Pulse: (!) 46  Temp: 98.3 F (36.8 C)  TempSrc: Temporal  Weight: 236 lb 14.4 oz (107.5 kg)  Height: 6' 1 (1.854 m)   Body mass index is 31.26 kg/m.   General:  WDWN in NAD; vital signs documented above Gait: Not observed HENT: WNL, normocephalic Pulmonary: normal non-labored breathing Cardiac: regular HR Skin: without rashes Extremities: well healed right radial incision with staples in tact without erythema.  Neurologic: A&O X 3; moving all extremities equally; speech is fluent/normal Psychiatric:  The pt has Normal affect.   Non-Invasive Vascular Imaging:    Previous Carotid duplex on 05/29/2024: Right: 80-99% ICA stenosis Left:   60-79% ICA stenosis  CTA neck 06/28/2024: IMPRESSION: 1. Suspected persistent high-grade stenoses of both proximal ICAs with motion artifact precluding quantification. 2. 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   ASSESSMENT/PLAN:: 73 y.o. male here for follow up carotid artery stenosis and is s/p right TCAR for asymptomatic carotid artery stenosis on 08/09/2024 by Dr. Magda.    On 08/19/2024, he underwent repair of ruptured right radial artery pseudoaneurysm by Dr. Serene, most likely from a-line injury.   -right radial incision healed and staples removed.  -Dr. Magda discussed with pt about intervention on the left and felt it was reasonable to return in a couple of months with repeat duplex and have further discussion about proceeding with the left ICA intervention.  Pt not wanting to proceed currently as he has not felt well over the past 5 weeks since radial artery surgery.  -discussed s/s of stroke with pt and he understands should he develop any of these sx, he will go to the nearest ER or call 911. -pt will f/u in 2-3 months with carotid duplex and see Dr. Magda as above -pt will call sooner should he have any issues. -continue statin/asa.  Ok to discontinue Plavix  currently but he does understand he may need to resume it if he has left TCAR.  Needs to continue baby asa.    Lucie Apt, [facility] [phone]  Clinic MD:  Magda

## 2024-09-12 ENCOUNTER — Other Ambulatory Visit (HOSPITAL_COMMUNITY): Payer: Self-pay

## 2024-09-12 MED ORDER — SACUBITRIL-VALSARTAN 49-51 MG PO TABS
1.0000 | ORAL_TABLET | Freq: Two times a day (BID) | ORAL | 1 refills | Status: AC
  Filled 2024-09-12: qty 180, 90d supply, fill #0

## 2024-09-13 ENCOUNTER — Other Ambulatory Visit: Payer: Self-pay | Admitting: *Deleted

## 2024-09-13 DIAGNOSIS — I6523 Occlusion and stenosis of bilateral carotid arteries: Secondary | ICD-10-CM

## 2024-10-29 ENCOUNTER — Emergency Department (HOSPITAL_BASED_OUTPATIENT_CLINIC_OR_DEPARTMENT_OTHER)
Admission: EM | Admit: 2024-10-29 | Discharge: 2024-10-29 | Disposition: A | Discharge status: Home / Self Care | Attending: Emergency Medicine | Admitting: Emergency Medicine

## 2024-10-29 ENCOUNTER — Other Ambulatory Visit: Payer: Self-pay

## 2024-10-29 ENCOUNTER — Emergency Department (HOSPITAL_BASED_OUTPATIENT_CLINIC_OR_DEPARTMENT_OTHER)

## 2024-10-29 ENCOUNTER — Other Ambulatory Visit (HOSPITAL_BASED_OUTPATIENT_CLINIC_OR_DEPARTMENT_OTHER): Payer: Self-pay

## 2024-10-29 DIAGNOSIS — Z7901 Long term (current) use of anticoagulants: Secondary | ICD-10-CM | POA: Insufficient documentation

## 2024-10-29 DIAGNOSIS — Z7902 Long term (current) use of antithrombotics/antiplatelets: Secondary | ICD-10-CM | POA: Diagnosis not present

## 2024-10-29 DIAGNOSIS — N179 Acute kidney failure, unspecified: Secondary | ICD-10-CM | POA: Insufficient documentation

## 2024-10-29 DIAGNOSIS — J189 Pneumonia, unspecified organism: Secondary | ICD-10-CM

## 2024-10-29 DIAGNOSIS — J101 Influenza due to other identified influenza virus with other respiratory manifestations: Secondary | ICD-10-CM | POA: Insufficient documentation

## 2024-10-29 DIAGNOSIS — Z7982 Long term (current) use of aspirin: Secondary | ICD-10-CM | POA: Insufficient documentation

## 2024-10-29 DIAGNOSIS — J181 Lobar pneumonia, unspecified organism: Secondary | ICD-10-CM | POA: Insufficient documentation

## 2024-10-29 DIAGNOSIS — I482 Chronic atrial fibrillation, unspecified: Secondary | ICD-10-CM | POA: Diagnosis not present

## 2024-10-29 DIAGNOSIS — R059 Cough, unspecified: Secondary | ICD-10-CM | POA: Diagnosis present

## 2024-10-29 LAB — COMPREHENSIVE METABOLIC PANEL WITH GFR
ALT: 9 U/L (ref 0–44)
AST: 27 U/L (ref 15–41)
Albumin: 3.7 g/dL (ref 3.5–5.0)
Alkaline Phosphatase: 77 U/L (ref 38–126)
Anion gap: 12 (ref 5–15)
BUN: 20 mg/dL (ref 8–23)
CO2: 22 mmol/L (ref 22–32)
Calcium: 8.3 mg/dL — ABNORMAL LOW (ref 8.9–10.3)
Chloride: 97 mmol/L — ABNORMAL LOW (ref 98–111)
Creatinine, Ser: 1.57 mg/dL — ABNORMAL HIGH (ref 0.61–1.24)
GFR, Estimated: 47 mL/min — ABNORMAL LOW
Glucose, Bld: 102 mg/dL — ABNORMAL HIGH (ref 70–99)
Potassium: 4.1 mmol/L (ref 3.5–5.1)
Sodium: 131 mmol/L — ABNORMAL LOW (ref 135–145)
Total Bilirubin: 0.9 mg/dL (ref 0.0–1.2)
Total Protein: 6.7 g/dL (ref 6.5–8.1)

## 2024-10-29 LAB — CBC
HCT: 31.7 % — ABNORMAL LOW (ref 39.0–52.0)
Hemoglobin: 8.9 g/dL — ABNORMAL LOW (ref 13.0–17.0)
MCH: 20.9 pg — ABNORMAL LOW (ref 26.0–34.0)
MCHC: 28.1 g/dL — ABNORMAL LOW (ref 30.0–36.0)
MCV: 74.4 fL — ABNORMAL LOW (ref 80.0–100.0)
Platelets: 203 K/uL (ref 150–400)
RBC: 4.26 MIL/uL (ref 4.22–5.81)
RDW: 17.8 % — ABNORMAL HIGH (ref 11.5–15.5)
WBC: 3.4 K/uL — ABNORMAL LOW (ref 4.0–10.5)
nRBC: 0 % (ref 0.0–0.2)

## 2024-10-29 LAB — RESP PANEL BY RT-PCR (RSV, FLU A&B, COVID)  RVPGX2
Influenza A by PCR: POSITIVE — AB
Influenza B by PCR: NEGATIVE
Resp Syncytial Virus by PCR: NEGATIVE
SARS Coronavirus 2 by RT PCR: NEGATIVE

## 2024-10-29 MED ORDER — DOXYCYCLINE HYCLATE 100 MG PO TABS
100.0000 mg | ORAL_TABLET | Freq: Once | ORAL | Status: AC
  Administered 2024-10-29: 100 mg via ORAL
  Filled 2024-10-29: qty 1

## 2024-10-29 MED ORDER — DOXYCYCLINE HYCLATE 100 MG PO CAPS: 100.0000 mg | ORAL_CAPSULE | Freq: Two times a day (BID) | ORAL | 0 refills | Status: DC

## 2024-10-29 MED ORDER — DOXYCYCLINE HYCLATE 100 MG PO CAPS
100.0000 mg | ORAL_CAPSULE | Freq: Two times a day (BID) | ORAL | 0 refills | Status: AC
  Filled 2024-10-29: qty 14, 7d supply, fill #0

## 2024-10-29 MED ORDER — SODIUM CHLORIDE 0.9 % IV BOLUS
500.0000 mL | Freq: Once | INTRAVENOUS | Status: AC
  Administered 2024-10-29: 500 mL via INTRAVENOUS

## 2024-10-29 NOTE — Discharge Instructions (Addendum)
 As we discussed you are positive for influenza A.  You are outside the treatment for Tamiflu  You also have pneumonia and I prescribed doxycycline  twice a day for a week  Please follow-up with your doctor  Return to ER if you have worse cough or trouble breathing or passing

## 2024-10-29 NOTE — ED Triage Notes (Addendum)
 Pt states that he has had a bad cough for a few weeks. He also reports some shortness of breath at times. States that he wants to be tested for the flu. Denies fever. State that he does have Afib.  Respiratory called to triage for evaluation

## 2024-10-29 NOTE — ED Provider Notes (Signed)
 " [name] Rice Provider Note   CSN: [id number] Arrival date & time: 10/29/24  1411     Patient presents with: Shortness of Breath   [name] Rice is a 73 y.o. male hx of carotid artery stenosis status post surgery on Plavix , here presenting with cough and congestion.  Patient has been coughing about 2 weeks.  He states that the coughing has not improved.  He denies any fevers.  He denies any productive cough and states that he was just concerned that he got the flu.  Denies any leg swelling or abdominal pain.  Patient did receive the flu shot this year   The history is provided by the patient.       Prior to Admission medications  Medication Sig Start Date End Date Taking? Authorizing Provider  Aspirin  81 MG CAPS Take 81 mg by mouth daily. 07/21/21   Kerrin Elspeth BROCKS, MD  clopidogrel  (PLAVIX ) 75 MG tablet Take 1 tablet (75 mg total) by mouth daily. 07/10/24   Magda Debby SAILOR, MD  dapagliflozin  propanediol (FARXIGA ) 10 MG TABS tablet Take 1 tablet (10 mg total) by mouth daily before breakfast. 10/10/23   Tolia, Sunit, DO  rivaroxaban  (XARELTO ) 20 MG TABS tablet Take 1 tablet (20 mg total) by mouth daily with supper. 08/22/24   Schuh, McKenzi P, PA-C  rosuvastatin  (CRESTOR ) 20 MG tablet TAKE 1 TABLET BY MOUTH AT  BEDTIME 02/01/24   Tolia, Sunit, DO  sacubitril -valsartan  (ENTRESTO ) 49-51 MG Take 1 tablet by mouth 2 (two) times daily. 09/12/24 09/12/25  Tolia, Sunit, DO  spironolactone  (ALDACTONE ) 25 MG tablet TAKE 1 TABLET BY MOUTH DAILY 01/03/24   Tolia, Sunit, DO  torsemide  (DEMADEX ) 20 MG tablet Take 1 tablet (20 mg total) by mouth daily as needed (weight gain or fluid retention). 01/26/24   Tolia, Sunit, DO  traMADol  (ULTRAM ) 50 MG tablet Take 1 tablet (50 mg total) by mouth every 6 (six) hours as needed. 08/20/24 08/20/25  Elna Loud P, PA-C    Allergies: Patient has no known allergies.    Review of Systems  Respiratory:  Positive for  cough and shortness of breath.   All other systems reviewed and are negative.   Updated Vital Signs BP (!) 109/47   Pulse (!) 57   Temp 97.9 F (36.6 C)   Resp 18   Ht 6' 1 (1.854 m)   Wt 107.5 kg   SpO2 100%   BMI 31.27 kg/m   Physical Exam Vitals and nursing note reviewed.  Constitutional:      Appearance: He is well-developed.  HENT:     Head: Normocephalic.     Mouth/Throat:     Mouth: Mucous membranes are moist.  Eyes:     Extraocular Movements: Extraocular movements intact.     Pupils: Pupils are equal, round, and reactive to light.  Cardiovascular:     Rate and Rhythm: Normal rate and regular rhythm.  Pulmonary:     Effort: Pulmonary effort is normal.     Comments: No wheezing or crackles Musculoskeletal:        General: Normal range of motion.     Cervical back: Normal range of motion and neck supple.  Skin:    General: Skin is warm.     Capillary Refill: Capillary refill takes less than 2 seconds.  Neurological:     General: No focal deficit present.     Mental Status: He is alert and oriented to person, place,  and time.  Psychiatric:        Mood and Affect: Mood normal.        Behavior: Behavior normal.     (all labs ordered are listed, but only abnormal results are displayed) Labs Reviewed  RESP PANEL BY RT-PCR (RSV, FLU A&B, COVID)  RVPGX2 - Abnormal; Notable for the following components:      Result Value   Influenza A by PCR POSITIVE (*)    All other components within normal limits  CBC - Abnormal; Notable for the following components:   WBC 3.4 (*)    Hemoglobin 8.9 (*)    HCT 31.7 (*)    MCV 74.4 (*)    MCH 20.9 (*)    MCHC 28.1 (*)    RDW 17.8 (*)    All other components within normal limits  COMPREHENSIVE METABOLIC PANEL WITH GFR    EKG: EKG Interpretation Date/Time:  Monday October 29 2024 14:29:56 EST Ventricular Rate:  83 PR Interval:    QRS Duration:  140 QT Interval:  407 QTC Calculation: 479 R Axis:   170  Text  Interpretation: Atrial fibrillation Nonspecific intraventricular conduction delay Abnormal lateral Q waves Probable anteroseptal infarct, recent No significant change since last tracing Confirmed by Randol Simmonds [phone]) on 10/29/2024 2:38:36 PM  Radiology: No results found.   Procedures   Medications Ordered in the ED  sodium chloride  0.9 % bolus 500 mL (has no administration in time range)                                    Medical Decision Making [name] Rice is a 73 y.o. male here presenting with cough and congestion.  Likely flu versus pneumonia.  Patient has history of A-fib but anticoagulated and I do not think he has a PE.  Plan to get CBC and CMP and flu test and chest x-ray  4:04 PM Patient has mild AKI.  Patient is fluid positive.  Chest x-ray showed atelectasis versus effusion.  Given that patient has been having cough we will empirically treat for pneumonia.  Patient is outside the window for Tamiflu.  Patient was briefly hypotensive but improved with IV fluids.  I do not think he is septic and patient is stable for discharge  Problems Addressed: Community acquired pneumonia of left lower lobe of lung: acute illness or injury Influenza A: acute illness or injury  Amount and/or Complexity of Data Reviewed Radiology: ordered.  Risk Prescription drug management.    Final diagnoses:  None    ED Discharge Orders     None          [name], MD 10/29/24 1604  "

## 2024-11-05 ENCOUNTER — Other Ambulatory Visit: Payer: Self-pay | Admitting: Cardiology

## 2024-11-05 DIAGNOSIS — I5033 Acute on chronic diastolic (congestive) heart failure: Secondary | ICD-10-CM

## 2024-11-15 ENCOUNTER — Other Ambulatory Visit: Payer: Self-pay | Admitting: Cardiology

## 2024-11-15 ENCOUNTER — Other Ambulatory Visit (HOSPITAL_COMMUNITY): Payer: Self-pay

## 2024-11-15 ENCOUNTER — Other Ambulatory Visit: Payer: Self-pay

## 2024-11-15 MED ORDER — DAPAGLIFLOZIN PROPANEDIOL 10 MG PO TABS
10.0000 mg | ORAL_TABLET | Freq: Every day | ORAL | 3 refills | Status: AC
  Filled 2024-11-15 – 2024-11-20 (×2): qty 90, 90d supply, fill #0

## 2024-11-20 ENCOUNTER — Telehealth: Payer: Self-pay | Admitting: Pharmacy Technician

## 2024-11-20 ENCOUNTER — Other Ambulatory Visit (HOSPITAL_COMMUNITY): Payer: Self-pay

## 2024-11-20 ENCOUNTER — Telehealth: Payer: Self-pay | Admitting: Cardiology

## 2024-11-20 NOTE — Telephone Encounter (Signed)
 Pt called in to see if he could renew his grant for Farxiga  please advise

## 2024-11-20 NOTE — Telephone Encounter (Signed)
 Renewal under review

## 2024-11-20 NOTE — Telephone Encounter (Signed)
" ° °  Patient Advocate Encounter   The patient was approved for a [employer] grant that will help cover the cost of farxiga  Total amount awarded, 7500.  Effective: 10/21/24 - 10/20/25   APW:[id number] ERW:[id number] Hmnle:[id number] PI:[id number] [employer] ID: [id number]   Pharmacy provided with approval and processing information. Patient informed via mychart    "

## 2024-11-21 ENCOUNTER — Other Ambulatory Visit (HOSPITAL_COMMUNITY): Payer: Self-pay

## 2024-11-21 ENCOUNTER — Other Ambulatory Visit (HOSPITAL_BASED_OUTPATIENT_CLINIC_OR_DEPARTMENT_OTHER): Payer: Self-pay

## 2024-11-21 NOTE — Telephone Encounter (Signed)
 Asked pharmacy to run on hw grant now. It was approved

## 2024-12-04 ENCOUNTER — Other Ambulatory Visit: Payer: Self-pay | Admitting: Cardiology

## 2024-12-04 DIAGNOSIS — I119 Hypertensive heart disease without heart failure: Secondary | ICD-10-CM

## 2024-12-04 DIAGNOSIS — I482 Chronic atrial fibrillation, unspecified: Secondary | ICD-10-CM

## 2024-12-04 NOTE — Telephone Encounter (Signed)
 Xarelto  20mg  refill request received. Pt is 74 years old, weight-107.5kg, Crea-1.57 on 10/29/24, last seen by Dr. Michele on 01/26/24, Diagnosis-Afib, CrCl- 65.62 mL/min; Dose is appropriate based on dosing criteria. Will send in refill to requested pharmacy.     Place a note on refill to call to schedule an appointment since due in March.

## 2024-12-18 ENCOUNTER — Ambulatory Visit (HOSPITAL_COMMUNITY)

## 2024-12-18 ENCOUNTER — Ambulatory Visit: Admitting: Vascular Surgery

## 2025-02-19 ENCOUNTER — Ambulatory Visit: Admitting: Cardiology
